# Patient Record
Sex: Female | Born: 1965 | Race: White | Hispanic: No | Smoking: Light tobacco smoker
Health system: Southern US, Community
[De-identification: ages and names within clinical notes are randomized; demographics above are authoritative.]

## PROBLEM LIST (undated history)

## (undated) ENCOUNTER — Emergency Department (HOSPITAL_COMMUNITY): Admission: EM | Discharge status: Absent without leave | Payer: Medicare Other

## (undated) ENCOUNTER — Emergency Department (HOSPITAL_COMMUNITY): Admission: EM | Payer: Medicare Other | Source: Home / Self Care

## (undated) DIAGNOSIS — K7290 Hepatic failure, unspecified without coma: Secondary | ICD-10-CM

## (undated) DIAGNOSIS — E039 Hypothyroidism, unspecified: Secondary | ICD-10-CM

## (undated) DIAGNOSIS — F319 Bipolar disorder, unspecified: Secondary | ICD-10-CM

## (undated) DIAGNOSIS — Z5189 Encounter for other specified aftercare: Secondary | ICD-10-CM

## (undated) DIAGNOSIS — N920 Excessive and frequent menstruation with regular cycle: Secondary | ICD-10-CM

## (undated) DIAGNOSIS — F419 Anxiety disorder, unspecified: Secondary | ICD-10-CM

## (undated) DIAGNOSIS — F329 Major depressive disorder, single episode, unspecified: Secondary | ICD-10-CM

## (undated) DIAGNOSIS — M3130 Wegener's granulomatosis without renal involvement: Secondary | ICD-10-CM

## (undated) DIAGNOSIS — Z862 Personal history of diseases of the blood and blood-forming organs and certain disorders involving the immune mechanism: Secondary | ICD-10-CM

## (undated) DIAGNOSIS — F32A Depression, unspecified: Secondary | ICD-10-CM

## (undated) DIAGNOSIS — J40 Bronchitis, not specified as acute or chronic: Secondary | ICD-10-CM

## (undated) DIAGNOSIS — K7460 Unspecified cirrhosis of liver: Secondary | ICD-10-CM

## (undated) HISTORY — PX: CHOLECYSTECTOMY: SHX55

## (undated) HISTORY — PX: TUBAL LIGATION: SHX77

## (undated) HISTORY — PX: ORTHOPEDIC SURGERY: SHX850

## (undated) HISTORY — PX: LAPAROSCOPY: SHX197

## (undated) HISTORY — PX: HERNIA REPAIR: SHX51

## (undated) HISTORY — PX: FRACTURE SURGERY: SHX138

---

## 2000-09-12 ENCOUNTER — Encounter: Payer: Self-pay | Admitting: Emergency Medicine

## 2000-09-12 ENCOUNTER — Emergency Department (HOSPITAL_COMMUNITY): Admission: EM | Admit: 2000-09-12 | Discharge: 2000-09-12 | Payer: Self-pay | Admitting: Emergency Medicine

## 2001-03-09 ENCOUNTER — Emergency Department (HOSPITAL_COMMUNITY): Admission: EM | Admit: 2001-03-09 | Discharge: 2001-03-09 | Payer: Self-pay | Admitting: Emergency Medicine

## 2001-05-28 ENCOUNTER — Inpatient Hospital Stay (HOSPITAL_COMMUNITY): Admission: AC | Admit: 2001-05-28 | Discharge: 2001-05-29 | Payer: Self-pay

## 2001-05-28 ENCOUNTER — Encounter: Payer: Self-pay | Admitting: General Surgery

## 2001-05-29 ENCOUNTER — Encounter: Payer: Self-pay | Admitting: General Surgery

## 2001-07-08 ENCOUNTER — Emergency Department (HOSPITAL_COMMUNITY): Admission: EM | Admit: 2001-07-08 | Discharge: 2001-07-08 | Payer: Self-pay | Admitting: Emergency Medicine

## 2001-12-11 ENCOUNTER — Inpatient Hospital Stay (HOSPITAL_COMMUNITY): Admission: EM | Admit: 2001-12-11 | Discharge: 2001-12-27 | Payer: Self-pay | Admitting: Psychiatry

## 2001-12-22 ENCOUNTER — Encounter: Payer: Self-pay | Admitting: Psychiatry

## 2002-01-06 ENCOUNTER — Emergency Department (HOSPITAL_COMMUNITY): Admission: EM | Admit: 2002-01-06 | Discharge: 2002-01-06 | Payer: Self-pay | Admitting: Emergency Medicine

## 2002-01-06 ENCOUNTER — Encounter: Payer: Self-pay | Admitting: Emergency Medicine

## 2002-05-28 ENCOUNTER — Inpatient Hospital Stay (HOSPITAL_COMMUNITY): Admission: AC | Admit: 2002-05-28 | Discharge: 2002-06-01 | Payer: Self-pay

## 2002-06-01 ENCOUNTER — Inpatient Hospital Stay (HOSPITAL_COMMUNITY): Admission: EM | Admit: 2002-06-01 | Discharge: 2002-06-10 | Payer: Self-pay | Admitting: *Deleted

## 2002-06-20 ENCOUNTER — Emergency Department (HOSPITAL_COMMUNITY): Admission: EM | Admit: 2002-06-20 | Discharge: 2002-06-21 | Payer: Self-pay | Admitting: Emergency Medicine

## 2002-06-21 ENCOUNTER — Inpatient Hospital Stay (HOSPITAL_COMMUNITY): Admission: EM | Admit: 2002-06-21 | Discharge: 2002-06-25 | Payer: Self-pay | Admitting: Psychiatry

## 2002-08-06 ENCOUNTER — Ambulatory Visit (HOSPITAL_BASED_OUTPATIENT_CLINIC_OR_DEPARTMENT_OTHER): Admission: RE | Admit: 2002-08-06 | Discharge: 2002-08-06 | Payer: Self-pay | Admitting: Orthopedic Surgery

## 2004-01-23 ENCOUNTER — Emergency Department (HOSPITAL_COMMUNITY): Admission: AD | Admit: 2004-01-23 | Discharge: 2004-01-23 | Payer: Self-pay | Admitting: Family Medicine

## 2004-03-03 ENCOUNTER — Emergency Department (HOSPITAL_COMMUNITY): Admission: EM | Admit: 2004-03-03 | Discharge: 2004-03-03 | Payer: Self-pay | Admitting: Family Medicine

## 2004-04-13 ENCOUNTER — Encounter: Admission: RE | Admit: 2004-04-13 | Discharge: 2004-04-13 | Payer: Self-pay | Admitting: Family Medicine

## 2004-04-16 ENCOUNTER — Encounter: Admission: RE | Admit: 2004-04-16 | Discharge: 2004-04-16 | Payer: Self-pay | Admitting: Family Medicine

## 2004-04-20 ENCOUNTER — Encounter: Admission: RE | Admit: 2004-04-20 | Discharge: 2004-04-20 | Payer: Self-pay | Admitting: Sports Medicine

## 2004-04-30 ENCOUNTER — Ambulatory Visit (HOSPITAL_COMMUNITY): Admission: RE | Admit: 2004-04-30 | Discharge: 2004-04-30 | Payer: Self-pay | Admitting: *Deleted

## 2004-05-23 ENCOUNTER — Emergency Department (HOSPITAL_COMMUNITY): Admission: EM | Admit: 2004-05-23 | Discharge: 2004-05-23 | Payer: Self-pay | Admitting: Emergency Medicine

## 2004-06-29 ENCOUNTER — Emergency Department (HOSPITAL_COMMUNITY): Admission: EM | Admit: 2004-06-29 | Discharge: 2004-06-29 | Payer: Self-pay | Admitting: Family Medicine

## 2004-08-26 ENCOUNTER — Inpatient Hospital Stay (HOSPITAL_COMMUNITY): Admission: EM | Admit: 2004-08-26 | Discharge: 2004-08-31 | Payer: Self-pay | Admitting: Emergency Medicine

## 2004-09-09 ENCOUNTER — Ambulatory Visit: Payer: Self-pay | Admitting: Family Medicine

## 2004-11-28 ENCOUNTER — Ambulatory Visit: Payer: Self-pay | Admitting: Psychiatry

## 2004-11-28 ENCOUNTER — Inpatient Hospital Stay (HOSPITAL_COMMUNITY): Admission: EM | Admit: 2004-11-28 | Discharge: 2004-12-10 | Payer: Self-pay | Admitting: Psychiatry

## 2004-11-29 ENCOUNTER — Encounter: Payer: Self-pay | Admitting: Emergency Medicine

## 2004-12-03 ENCOUNTER — Encounter: Payer: Self-pay | Admitting: Psychiatry

## 2004-12-08 ENCOUNTER — Ambulatory Visit: Payer: Self-pay | Admitting: Internal Medicine

## 2004-12-09 ENCOUNTER — Encounter: Payer: Self-pay | Admitting: Psychiatry

## 2004-12-16 ENCOUNTER — Ambulatory Visit: Payer: Self-pay | Admitting: Internal Medicine

## 2005-01-03 ENCOUNTER — Emergency Department (HOSPITAL_COMMUNITY): Admission: EM | Admit: 2005-01-03 | Discharge: 2005-01-03 | Payer: Self-pay | Admitting: Emergency Medicine

## 2005-01-07 ENCOUNTER — Ambulatory Visit: Payer: Self-pay | Admitting: Internal Medicine

## 2005-01-11 ENCOUNTER — Ambulatory Visit: Payer: Self-pay | Admitting: Internal Medicine

## 2005-01-11 DIAGNOSIS — K766 Portal hypertension: Secondary | ICD-10-CM

## 2005-01-11 DIAGNOSIS — K210 Gastro-esophageal reflux disease with esophagitis: Secondary | ICD-10-CM

## 2005-01-11 DIAGNOSIS — K644 Residual hemorrhoidal skin tags: Secondary | ICD-10-CM | POA: Insufficient documentation

## 2005-02-12 ENCOUNTER — Inpatient Hospital Stay (HOSPITAL_COMMUNITY): Admission: RE | Admit: 2005-02-12 | Discharge: 2005-02-21 | Payer: Self-pay | Admitting: Orthopedic Surgery

## 2005-02-12 ENCOUNTER — Encounter (INDEPENDENT_AMBULATORY_CARE_PROVIDER_SITE_OTHER): Payer: Self-pay | Admitting: Specialist

## 2005-03-01 ENCOUNTER — Ambulatory Visit: Payer: Self-pay | Admitting: Family Medicine

## 2005-03-09 ENCOUNTER — Ambulatory Visit: Payer: Self-pay | Admitting: Internal Medicine

## 2005-05-10 ENCOUNTER — Ambulatory Visit: Payer: Self-pay | Admitting: Internal Medicine

## 2005-12-30 ENCOUNTER — Encounter: Payer: Self-pay | Admitting: Emergency Medicine

## 2005-12-30 ENCOUNTER — Inpatient Hospital Stay (HOSPITAL_COMMUNITY): Admission: AD | Admit: 2005-12-30 | Discharge: 2006-01-06 | Payer: Self-pay | Admitting: Psychiatry

## 2005-12-31 ENCOUNTER — Ambulatory Visit: Payer: Self-pay | Admitting: Psychiatry

## 2006-01-11 ENCOUNTER — Ambulatory Visit: Payer: Self-pay | Admitting: Internal Medicine

## 2006-01-17 ENCOUNTER — Ambulatory Visit: Payer: Self-pay | Admitting: Internal Medicine

## 2006-01-31 ENCOUNTER — Ambulatory Visit: Payer: Self-pay | Admitting: Internal Medicine

## 2006-02-14 ENCOUNTER — Ambulatory Visit (HOSPITAL_BASED_OUTPATIENT_CLINIC_OR_DEPARTMENT_OTHER): Admission: RE | Admit: 2006-02-14 | Discharge: 2006-02-14 | Payer: Self-pay | Admitting: Orthopedic Surgery

## 2006-07-12 ENCOUNTER — Emergency Department (HOSPITAL_COMMUNITY): Admission: EM | Admit: 2006-07-12 | Discharge: 2006-07-13 | Payer: Self-pay | Admitting: Emergency Medicine

## 2007-02-05 ENCOUNTER — Ambulatory Visit: Payer: Self-pay | Admitting: Internal Medicine

## 2007-02-22 DIAGNOSIS — I8500 Esophageal varices without bleeding: Secondary | ICD-10-CM | POA: Insufficient documentation

## 2007-02-22 DIAGNOSIS — F1010 Alcohol abuse, uncomplicated: Secondary | ICD-10-CM | POA: Insufficient documentation

## 2007-02-22 DIAGNOSIS — F17200 Nicotine dependence, unspecified, uncomplicated: Secondary | ICD-10-CM

## 2007-04-23 ENCOUNTER — Emergency Department (HOSPITAL_COMMUNITY): Admission: EM | Admit: 2007-04-23 | Discharge: 2007-04-23 | Payer: Self-pay | Admitting: Emergency Medicine

## 2007-07-05 DIAGNOSIS — D500 Iron deficiency anemia secondary to blood loss (chronic): Secondary | ICD-10-CM | POA: Insufficient documentation

## 2007-07-05 DIAGNOSIS — D61818 Other pancytopenia: Secondary | ICD-10-CM

## 2007-07-05 DIAGNOSIS — I129 Hypertensive chronic kidney disease with stage 1 through stage 4 chronic kidney disease, or unspecified chronic kidney disease: Secondary | ICD-10-CM | POA: Insufficient documentation

## 2007-07-05 DIAGNOSIS — K253 Acute gastric ulcer without hemorrhage or perforation: Secondary | ICD-10-CM | POA: Insufficient documentation

## 2007-07-06 ENCOUNTER — Inpatient Hospital Stay (HOSPITAL_COMMUNITY): Admission: EM | Admit: 2007-07-06 | Discharge: 2007-07-10 | Payer: Self-pay | Admitting: Emergency Medicine

## 2007-07-09 ENCOUNTER — Ambulatory Visit: Payer: Self-pay | Admitting: Oncology

## 2007-07-10 ENCOUNTER — Encounter (INDEPENDENT_AMBULATORY_CARE_PROVIDER_SITE_OTHER): Payer: Self-pay | Admitting: Nurse Practitioner

## 2007-07-10 LAB — CONVERTED CEMR LAB: HIV-1 Genotyping, DNA Sequencing: NEGATIVE

## 2007-07-13 ENCOUNTER — Ambulatory Visit: Payer: Self-pay | Admitting: Internal Medicine

## 2007-07-18 ENCOUNTER — Inpatient Hospital Stay (HOSPITAL_COMMUNITY): Admission: EM | Admit: 2007-07-18 | Discharge: 2007-07-18 | Payer: Self-pay | Admitting: Emergency Medicine

## 2007-07-18 DIAGNOSIS — Z8719 Personal history of other diseases of the digestive system: Secondary | ICD-10-CM

## 2007-07-18 DIAGNOSIS — K649 Unspecified hemorrhoids: Secondary | ICD-10-CM | POA: Insufficient documentation

## 2007-07-23 ENCOUNTER — Emergency Department (HOSPITAL_COMMUNITY): Admission: EM | Admit: 2007-07-23 | Discharge: 2007-07-23 | Payer: Self-pay | Admitting: Emergency Medicine

## 2007-07-30 ENCOUNTER — Ambulatory Visit: Payer: Self-pay | Admitting: Internal Medicine

## 2007-07-31 ENCOUNTER — Encounter (INDEPENDENT_AMBULATORY_CARE_PROVIDER_SITE_OTHER): Payer: Self-pay | Admitting: Family Medicine

## 2007-07-31 LAB — CONVERTED CEMR LAB
ALT: 11 units/L (ref 0–35)
AST: 36 units/L (ref 0–37)
Alkaline Phosphatase: 94 units/L (ref 39–117)
Amylase: 16 units/L (ref 0–105)
Basophils Absolute: 0 10*3/uL (ref 0.0–0.1)
Chloride: 106 meq/L (ref 96–112)
Creatinine, Ser: 0.61 mg/dL (ref 0.40–1.20)
Eosinophils Absolute: 0.1 10*3/uL (ref 0.0–0.7)
Eosinophils Relative: 2 % (ref 0–5)
Lipase: 30 units/L (ref 0–75)
MCHC: 29.8 g/dL — ABNORMAL LOW (ref 30.0–36.0)
MCV: 93.8 fL (ref 78.0–100.0)
Monocytes Absolute: 0.5 10*3/uL (ref 0.2–0.7)
Monocytes Relative: 11 % (ref 3–11)
Neutrophils Relative %: 64 % (ref 43–77)
Potassium: 4.4 meq/L (ref 3.5–5.3)
RBC: 3.9 M/uL (ref 3.87–5.11)
WBC: 4.3 10*3/uL (ref 4.0–10.5)

## 2007-08-01 ENCOUNTER — Ambulatory Visit: Payer: Self-pay | Admitting: Internal Medicine

## 2007-08-13 ENCOUNTER — Ambulatory Visit: Payer: Self-pay | Admitting: Internal Medicine

## 2007-08-13 ENCOUNTER — Encounter: Payer: Self-pay | Admitting: Nurse Practitioner

## 2007-08-13 DIAGNOSIS — F319 Bipolar disorder, unspecified: Secondary | ICD-10-CM

## 2007-08-13 DIAGNOSIS — F1910 Other psychoactive substance abuse, uncomplicated: Secondary | ICD-10-CM

## 2007-08-13 DIAGNOSIS — K2289 Other specified disease of esophagus: Secondary | ICD-10-CM | POA: Insufficient documentation

## 2007-08-13 DIAGNOSIS — K228 Other specified diseases of esophagus: Secondary | ICD-10-CM

## 2007-08-13 DIAGNOSIS — K7030 Alcoholic cirrhosis of liver without ascites: Secondary | ICD-10-CM | POA: Insufficient documentation

## 2007-08-13 DIAGNOSIS — Z8659 Personal history of other mental and behavioral disorders: Secondary | ICD-10-CM

## 2007-08-23 ENCOUNTER — Telehealth (INDEPENDENT_AMBULATORY_CARE_PROVIDER_SITE_OTHER): Payer: Self-pay | Admitting: *Deleted

## 2007-08-30 ENCOUNTER — Ambulatory Visit: Payer: Self-pay | Admitting: Internal Medicine

## 2007-08-30 ENCOUNTER — Encounter: Payer: Self-pay | Admitting: Nurse Practitioner

## 2007-08-30 DIAGNOSIS — G4700 Insomnia, unspecified: Secondary | ICD-10-CM | POA: Insufficient documentation

## 2007-09-20 ENCOUNTER — Ambulatory Visit: Payer: Self-pay | Admitting: Nurse Practitioner

## 2007-09-20 LAB — CONVERTED CEMR LAB
Basophils Absolute: 0 10*3/uL (ref 0.0–0.1)
Bilirubin, Direct: 0.6 mg/dL — ABNORMAL HIGH (ref 0.0–0.3)
HCT: 31.9 % — ABNORMAL LOW (ref 36.0–46.0)
Hemoglobin: 9.7 g/dL — ABNORMAL LOW (ref 12.0–15.0)
Lipase: 33 units/L (ref 0–75)
Lymphocytes Relative: 36 % (ref 12–46)
Lymphs Abs: 0.9 10*3/uL (ref 0.7–3.3)
Monocytes Absolute: 0.3 10*3/uL (ref 0.2–0.7)
Monocytes Relative: 10 % (ref 3–11)
Neutro Abs: 1.3 10*3/uL — ABNORMAL LOW (ref 1.7–7.7)
Neutrophils Relative %: 51 % (ref 43–77)
Platelets: 193 10*3/uL (ref 150–400)
RDW: 16.2 % — ABNORMAL HIGH (ref 11.5–14.0)
Total Bilirubin: 1.7 mg/dL — ABNORMAL HIGH (ref 0.3–1.2)

## 2007-09-21 ENCOUNTER — Encounter (INDEPENDENT_AMBULATORY_CARE_PROVIDER_SITE_OTHER): Payer: Self-pay | Admitting: Nurse Practitioner

## 2007-09-24 ENCOUNTER — Telehealth (INDEPENDENT_AMBULATORY_CARE_PROVIDER_SITE_OTHER): Payer: Self-pay | Admitting: Nurse Practitioner

## 2007-10-11 ENCOUNTER — Ambulatory Visit: Payer: Self-pay | Admitting: Nurse Practitioner

## 2007-11-12 ENCOUNTER — Ambulatory Visit: Payer: Self-pay | Admitting: Nurse Practitioner

## 2007-11-30 ENCOUNTER — Ambulatory Visit: Payer: Self-pay | Admitting: Nurse Practitioner

## 2007-12-12 ENCOUNTER — Ambulatory Visit: Payer: Self-pay | Admitting: Nurse Practitioner

## 2007-12-12 LAB — CONVERTED CEMR LAB
ALT: 15 units/L (ref 0–35)
Alkaline Phosphatase: 83 units/L (ref 39–117)
BUN: 10 mg/dL (ref 6–23)
Basophils Absolute: 0 10*3/uL (ref 0.0–0.1)
CO2: 24 meq/L (ref 19–32)
Calcium: 8.7 mg/dL (ref 8.4–10.5)
Chloride: 102 meq/L (ref 96–112)
Creatinine, Ser: 0.75 mg/dL (ref 0.40–1.20)
Eosinophils Relative: 2 % (ref 0–5)
Glucose, Bld: 110 mg/dL — ABNORMAL HIGH (ref 70–99)
HCT: 33.1 % — ABNORMAL LOW (ref 36.0–46.0)
Lymphs Abs: 0.8 10*3/uL (ref 0.7–4.0)
Monocytes Absolute: 0.2 10*3/uL (ref 0.1–1.0)
Potassium: 4.4 meq/L (ref 3.5–5.3)
RBC: 4.24 M/uL (ref 3.87–5.11)
Total Bilirubin: 1.1 mg/dL (ref 0.3–1.2)
WBC: 2.3 10*3/uL — ABNORMAL LOW (ref 4.0–10.5)

## 2007-12-19 ENCOUNTER — Ambulatory Visit: Payer: Self-pay | Admitting: Nurse Practitioner

## 2008-01-09 ENCOUNTER — Ambulatory Visit: Payer: Self-pay | Admitting: Nurse Practitioner

## 2008-01-23 ENCOUNTER — Ambulatory Visit: Payer: Self-pay | Admitting: Nurse Practitioner

## 2008-01-23 DIAGNOSIS — F329 Major depressive disorder, single episode, unspecified: Secondary | ICD-10-CM

## 2008-01-23 DIAGNOSIS — F3289 Other specified depressive episodes: Secondary | ICD-10-CM | POA: Insufficient documentation

## 2008-02-07 ENCOUNTER — Ambulatory Visit: Payer: Self-pay | Admitting: Nurse Practitioner

## 2008-02-21 ENCOUNTER — Ambulatory Visit: Payer: Self-pay | Admitting: Nurse Practitioner

## 2008-03-06 ENCOUNTER — Ambulatory Visit: Payer: Self-pay | Admitting: Nurse Practitioner

## 2008-03-06 DIAGNOSIS — K709 Alcoholic liver disease, unspecified: Secondary | ICD-10-CM

## 2008-03-11 ENCOUNTER — Encounter (INDEPENDENT_AMBULATORY_CARE_PROVIDER_SITE_OTHER): Payer: Self-pay | Admitting: Nurse Practitioner

## 2008-03-12 ENCOUNTER — Telehealth (INDEPENDENT_AMBULATORY_CARE_PROVIDER_SITE_OTHER): Payer: Self-pay | Admitting: Nurse Practitioner

## 2008-03-12 DIAGNOSIS — K439 Ventral hernia without obstruction or gangrene: Secondary | ICD-10-CM | POA: Insufficient documentation

## 2008-03-24 ENCOUNTER — Ambulatory Visit: Payer: Self-pay | Admitting: Nurse Practitioner

## 2008-03-25 ENCOUNTER — Encounter (INDEPENDENT_AMBULATORY_CARE_PROVIDER_SITE_OTHER): Payer: Self-pay | Admitting: Nurse Practitioner

## 2008-04-02 ENCOUNTER — Encounter (INDEPENDENT_AMBULATORY_CARE_PROVIDER_SITE_OTHER): Payer: Self-pay | Admitting: Nurse Practitioner

## 2008-08-25 ENCOUNTER — Ambulatory Visit: Payer: Self-pay | Admitting: Nurse Practitioner

## 2008-08-25 DIAGNOSIS — M79609 Pain in unspecified limb: Secondary | ICD-10-CM | POA: Insufficient documentation

## 2008-08-25 DIAGNOSIS — H5789 Other specified disorders of eye and adnexa: Secondary | ICD-10-CM

## 2008-08-26 LAB — CONVERTED CEMR LAB
ALT: 17 units/L (ref 0–35)
AST: 25 units/L (ref 0–37)
Albumin: 4.3 g/dL (ref 3.5–5.2)
Basophils Absolute: 0 10*3/uL (ref 0.0–0.1)
Chloride: 100 meq/L (ref 96–112)
Eosinophils Absolute: 0 10*3/uL (ref 0.0–0.7)
Glucose, Bld: 101 mg/dL — ABNORMAL HIGH (ref 70–99)
Hemoglobin: 12.4 g/dL (ref 12.0–15.0)
INR: 1.1 (ref 0.0–1.5)
Lymphocytes Relative: 26 % (ref 12–46)
Lymphs Abs: 0.8 10*3/uL (ref 0.7–4.0)
MCV: 84.2 fL (ref 78.0–100.0)
Monocytes Absolute: 0.3 10*3/uL (ref 0.1–1.0)
Monocytes Relative: 10 % (ref 3–12)
Potassium: 4.1 meq/L (ref 3.5–5.3)
Total Bilirubin: 1.7 mg/dL — ABNORMAL HIGH (ref 0.3–1.2)
WBC: 3.1 10*3/uL — ABNORMAL LOW (ref 4.0–10.5)

## 2008-09-25 ENCOUNTER — Ambulatory Visit: Payer: Self-pay | Admitting: Nurse Practitioner

## 2008-10-15 ENCOUNTER — Emergency Department (HOSPITAL_COMMUNITY): Admission: EM | Admit: 2008-10-15 | Discharge: 2008-10-15 | Payer: Self-pay | Admitting: Emergency Medicine

## 2008-10-27 ENCOUNTER — Encounter (INDEPENDENT_AMBULATORY_CARE_PROVIDER_SITE_OTHER): Payer: Self-pay | Admitting: *Deleted

## 2008-11-06 ENCOUNTER — Encounter: Payer: Self-pay | Admitting: Emergency Medicine

## 2008-11-06 ENCOUNTER — Inpatient Hospital Stay (HOSPITAL_COMMUNITY): Admission: EM | Admit: 2008-11-06 | Discharge: 2008-12-05 | Payer: Self-pay | Admitting: Psychiatry

## 2008-11-07 ENCOUNTER — Ambulatory Visit: Payer: Self-pay | Admitting: Psychiatry

## 2008-11-18 ENCOUNTER — Encounter (INDEPENDENT_AMBULATORY_CARE_PROVIDER_SITE_OTHER): Payer: Self-pay | Admitting: Nurse Practitioner

## 2008-11-20 ENCOUNTER — Encounter: Payer: Self-pay | Admitting: Emergency Medicine

## 2008-11-27 ENCOUNTER — Other Ambulatory Visit: Payer: Self-pay | Admitting: *Deleted

## 2008-12-02 ENCOUNTER — Other Ambulatory Visit (HOSPITAL_COMMUNITY): Payer: Self-pay | Admitting: Psychiatry

## 2008-12-02 ENCOUNTER — Other Ambulatory Visit: Payer: Self-pay | Admitting: *Deleted

## 2008-12-03 ENCOUNTER — Ambulatory Visit: Payer: Self-pay | Admitting: Vascular Surgery

## 2008-12-03 ENCOUNTER — Encounter (HOSPITAL_COMMUNITY): Payer: Self-pay | Admitting: Psychiatry

## 2008-12-23 ENCOUNTER — Telehealth (INDEPENDENT_AMBULATORY_CARE_PROVIDER_SITE_OTHER): Payer: Self-pay | Admitting: Nurse Practitioner

## 2008-12-24 ENCOUNTER — Encounter (INDEPENDENT_AMBULATORY_CARE_PROVIDER_SITE_OTHER): Payer: Self-pay | Admitting: *Deleted

## 2008-12-29 ENCOUNTER — Ambulatory Visit: Payer: Self-pay | Admitting: Nurse Practitioner

## 2008-12-29 DIAGNOSIS — K449 Diaphragmatic hernia without obstruction or gangrene: Secondary | ICD-10-CM | POA: Insufficient documentation

## 2008-12-30 ENCOUNTER — Encounter (INDEPENDENT_AMBULATORY_CARE_PROVIDER_SITE_OTHER): Payer: Self-pay | Admitting: Nurse Practitioner

## 2009-01-01 ENCOUNTER — Telehealth (INDEPENDENT_AMBULATORY_CARE_PROVIDER_SITE_OTHER): Payer: Self-pay | Admitting: Nurse Practitioner

## 2009-01-12 ENCOUNTER — Encounter (INDEPENDENT_AMBULATORY_CARE_PROVIDER_SITE_OTHER): Payer: Self-pay | Admitting: Nurse Practitioner

## 2009-01-14 ENCOUNTER — Ambulatory Visit: Payer: Self-pay | Admitting: Nurse Practitioner

## 2009-01-15 ENCOUNTER — Telehealth (INDEPENDENT_AMBULATORY_CARE_PROVIDER_SITE_OTHER): Payer: Self-pay | Admitting: Nurse Practitioner

## 2009-01-19 ENCOUNTER — Telehealth (INDEPENDENT_AMBULATORY_CARE_PROVIDER_SITE_OTHER): Payer: Self-pay | Admitting: *Deleted

## 2009-01-28 ENCOUNTER — Encounter (INDEPENDENT_AMBULATORY_CARE_PROVIDER_SITE_OTHER): Payer: Self-pay | Admitting: *Deleted

## 2009-02-10 ENCOUNTER — Telehealth (INDEPENDENT_AMBULATORY_CARE_PROVIDER_SITE_OTHER): Payer: Self-pay | Admitting: Nurse Practitioner

## 2009-02-18 ENCOUNTER — Ambulatory Visit: Payer: Self-pay | Admitting: Nurse Practitioner

## 2009-02-18 DIAGNOSIS — R498 Other voice and resonance disorders: Secondary | ICD-10-CM | POA: Insufficient documentation

## 2009-02-27 ENCOUNTER — Other Ambulatory Visit: Admission: RE | Admit: 2009-02-27 | Discharge: 2009-02-27 | Payer: Self-pay | Admitting: Internal Medicine

## 2009-02-27 ENCOUNTER — Encounter (INDEPENDENT_AMBULATORY_CARE_PROVIDER_SITE_OTHER): Payer: Self-pay | Admitting: Nurse Practitioner

## 2009-02-27 ENCOUNTER — Ambulatory Visit: Payer: Self-pay | Admitting: Nurse Practitioner

## 2009-03-02 ENCOUNTER — Encounter (INDEPENDENT_AMBULATORY_CARE_PROVIDER_SITE_OTHER): Payer: Self-pay | Admitting: Nurse Practitioner

## 2009-03-02 DIAGNOSIS — E039 Hypothyroidism, unspecified: Secondary | ICD-10-CM

## 2009-03-02 LAB — CONVERTED CEMR LAB: T3, Free: 3.3 pg/mL (ref 2.3–4.2)

## 2009-03-03 ENCOUNTER — Encounter (INDEPENDENT_AMBULATORY_CARE_PROVIDER_SITE_OTHER): Payer: Self-pay | Admitting: Nurse Practitioner

## 2009-03-03 ENCOUNTER — Encounter: Admission: RE | Admit: 2009-03-03 | Discharge: 2009-03-11 | Payer: Self-pay | Admitting: Sports Medicine

## 2009-03-09 ENCOUNTER — Encounter (INDEPENDENT_AMBULATORY_CARE_PROVIDER_SITE_OTHER): Payer: Self-pay | Admitting: Nurse Practitioner

## 2009-03-12 ENCOUNTER — Encounter (INDEPENDENT_AMBULATORY_CARE_PROVIDER_SITE_OTHER): Payer: Self-pay | Admitting: Nurse Practitioner

## 2009-03-13 ENCOUNTER — Inpatient Hospital Stay (HOSPITAL_COMMUNITY): Admission: RE | Admit: 2009-03-13 | Discharge: 2009-03-17 | Payer: Self-pay | Admitting: Surgery

## 2009-03-18 ENCOUNTER — Emergency Department (HOSPITAL_COMMUNITY): Admission: EM | Admit: 2009-03-18 | Discharge: 2009-03-18 | Payer: Self-pay | Admitting: Emergency Medicine

## 2009-03-20 ENCOUNTER — Telehealth (INDEPENDENT_AMBULATORY_CARE_PROVIDER_SITE_OTHER): Payer: Self-pay | Admitting: Nurse Practitioner

## 2009-04-03 ENCOUNTER — Encounter (INDEPENDENT_AMBULATORY_CARE_PROVIDER_SITE_OTHER): Payer: Self-pay | Admitting: Nurse Practitioner

## 2009-05-29 ENCOUNTER — Encounter (INDEPENDENT_AMBULATORY_CARE_PROVIDER_SITE_OTHER): Payer: Self-pay | Admitting: Nurse Practitioner

## 2009-06-24 ENCOUNTER — Ambulatory Visit: Payer: Self-pay | Admitting: Nurse Practitioner

## 2009-06-26 ENCOUNTER — Encounter (INDEPENDENT_AMBULATORY_CARE_PROVIDER_SITE_OTHER): Payer: Self-pay | Admitting: Nurse Practitioner

## 2009-07-03 ENCOUNTER — Ambulatory Visit: Payer: Self-pay | Admitting: Nurse Practitioner

## 2009-07-16 ENCOUNTER — Ambulatory Visit (HOSPITAL_BASED_OUTPATIENT_CLINIC_OR_DEPARTMENT_OTHER): Admission: RE | Admit: 2009-07-16 | Discharge: 2009-07-16 | Payer: Self-pay | Admitting: Orthopedic Surgery

## 2009-07-20 ENCOUNTER — Telehealth (INDEPENDENT_AMBULATORY_CARE_PROVIDER_SITE_OTHER): Payer: Self-pay | Admitting: Nurse Practitioner

## 2009-08-05 ENCOUNTER — Ambulatory Visit: Payer: Self-pay | Admitting: Nurse Practitioner

## 2009-08-12 LAB — CONVERTED CEMR LAB
ALT: 11 units/L (ref 0–35)
Alkaline Phosphatase: 83 units/L (ref 39–117)
BUN: 11 mg/dL (ref 6–23)
Basophils Absolute: 0 10*3/uL (ref 0.0–0.1)
Basophils Relative: 0 % (ref 0–1)
Calcium: 8.8 mg/dL (ref 8.4–10.5)
Hemoglobin: 11.7 g/dL — ABNORMAL LOW (ref 12.0–15.0)
INR: 1.1 (ref 0.0–1.5)
Monocytes Absolute: 0.2 10*3/uL (ref 0.1–1.0)
Monocytes Relative: 9 % (ref 3–12)
Neutrophils Relative %: 48 % (ref 43–77)
Platelets: 100 10*3/uL — ABNORMAL LOW (ref 150–400)
Prothrombin Time: 14 s (ref 11.6–15.2)
RBC: 4.19 M/uL (ref 3.87–5.11)
TSH: 2.803 microintl units/mL (ref 0.350–4.500)
Total Protein: 7.1 g/dL (ref 6.0–8.3)

## 2009-08-18 ENCOUNTER — Encounter: Admission: RE | Admit: 2009-08-18 | Discharge: 2009-08-18 | Payer: Self-pay | Admitting: Internal Medicine

## 2009-08-20 ENCOUNTER — Ambulatory Visit: Payer: Self-pay | Admitting: Nurse Practitioner

## 2009-09-16 ENCOUNTER — Telehealth (INDEPENDENT_AMBULATORY_CARE_PROVIDER_SITE_OTHER): Payer: Self-pay | Admitting: Nurse Practitioner

## 2009-10-02 ENCOUNTER — Ambulatory Visit: Payer: Self-pay | Admitting: Nurse Practitioner

## 2009-10-07 ENCOUNTER — Telehealth (INDEPENDENT_AMBULATORY_CARE_PROVIDER_SITE_OTHER): Payer: Self-pay | Admitting: Nurse Practitioner

## 2009-11-06 ENCOUNTER — Ambulatory Visit: Payer: Self-pay | Admitting: Nurse Practitioner

## 2009-11-10 LAB — CONVERTED CEMR LAB
Free T4: 0.86 ng/dL (ref 0.80–1.80)
T3, Free: 2.3 pg/mL (ref 2.3–4.2)

## 2009-11-12 ENCOUNTER — Ambulatory Visit (HOSPITAL_COMMUNITY): Admission: RE | Admit: 2009-11-12 | Discharge: 2009-11-12 | Payer: Self-pay | Admitting: Internal Medicine

## 2009-11-17 ENCOUNTER — Telehealth (INDEPENDENT_AMBULATORY_CARE_PROVIDER_SITE_OTHER): Payer: Self-pay | Admitting: Nurse Practitioner

## 2009-11-27 ENCOUNTER — Encounter (INDEPENDENT_AMBULATORY_CARE_PROVIDER_SITE_OTHER): Payer: Self-pay | Admitting: Nurse Practitioner

## 2009-11-30 ENCOUNTER — Encounter (INDEPENDENT_AMBULATORY_CARE_PROVIDER_SITE_OTHER): Payer: Self-pay | Admitting: Nurse Practitioner

## 2009-12-04 ENCOUNTER — Emergency Department (HOSPITAL_COMMUNITY): Admission: EM | Admit: 2009-12-04 | Discharge: 2009-12-04 | Payer: Self-pay | Admitting: Family Medicine

## 2009-12-17 ENCOUNTER — Ambulatory Visit: Payer: Self-pay | Admitting: Nurse Practitioner

## 2009-12-17 LAB — CONVERTED CEMR LAB: Blood Glucose, Fingerstick: 99

## 2010-01-11 ENCOUNTER — Ambulatory Visit: Payer: Self-pay | Admitting: Nurse Practitioner

## 2010-01-11 DIAGNOSIS — G8921 Chronic pain due to trauma: Secondary | ICD-10-CM

## 2010-01-11 DIAGNOSIS — I10 Essential (primary) hypertension: Secondary | ICD-10-CM

## 2010-02-05 ENCOUNTER — Ambulatory Visit: Payer: Self-pay | Admitting: Nurse Practitioner

## 2010-02-05 DIAGNOSIS — M25569 Pain in unspecified knee: Secondary | ICD-10-CM

## 2010-02-08 DIAGNOSIS — R7989 Other specified abnormal findings of blood chemistry: Secondary | ICD-10-CM | POA: Insufficient documentation

## 2010-02-08 LAB — CONVERTED CEMR LAB: Uric Acid, Serum: 7.2 mg/dL — ABNORMAL HIGH (ref 2.4–7.0)

## 2010-03-05 ENCOUNTER — Ambulatory Visit: Payer: Self-pay | Admitting: Nurse Practitioner

## 2010-03-19 ENCOUNTER — Other Ambulatory Visit: Payer: Self-pay | Admitting: Emergency Medicine

## 2010-03-20 ENCOUNTER — Ambulatory Visit: Payer: Self-pay | Admitting: Psychiatry

## 2010-03-20 ENCOUNTER — Inpatient Hospital Stay (HOSPITAL_COMMUNITY): Admission: EM | Admit: 2010-03-20 | Discharge: 2010-03-30 | Payer: Self-pay | Admitting: Psychiatry

## 2010-03-25 ENCOUNTER — Encounter (HOSPITAL_COMMUNITY): Payer: Self-pay | Admitting: Psychiatry

## 2010-03-29 ENCOUNTER — Ambulatory Visit: Payer: Self-pay | Admitting: Vascular Surgery

## 2010-03-29 ENCOUNTER — Encounter (HOSPITAL_COMMUNITY): Payer: Self-pay | Admitting: Psychiatry

## 2010-03-30 ENCOUNTER — Inpatient Hospital Stay (HOSPITAL_COMMUNITY): Admission: EM | Admit: 2010-03-30 | Discharge: 2010-04-16 | Payer: Self-pay | Admitting: Internal Medicine

## 2010-03-30 DIAGNOSIS — D869 Sarcoidosis, unspecified: Secondary | ICD-10-CM

## 2010-03-31 ENCOUNTER — Encounter (INDEPENDENT_AMBULATORY_CARE_PROVIDER_SITE_OTHER): Payer: Self-pay | Admitting: Internal Medicine

## 2010-04-01 ENCOUNTER — Encounter (INDEPENDENT_AMBULATORY_CARE_PROVIDER_SITE_OTHER): Payer: Self-pay | Admitting: Internal Medicine

## 2010-04-04 ENCOUNTER — Encounter (INDEPENDENT_AMBULATORY_CARE_PROVIDER_SITE_OTHER): Payer: Self-pay | Admitting: Internal Medicine

## 2010-04-05 ENCOUNTER — Ambulatory Visit: Payer: Self-pay | Admitting: Infectious Disease

## 2010-04-07 ENCOUNTER — Encounter (INDEPENDENT_AMBULATORY_CARE_PROVIDER_SITE_OTHER): Payer: Self-pay | Admitting: Internal Medicine

## 2010-04-28 ENCOUNTER — Encounter: Payer: Self-pay | Admitting: Infectious Disease

## 2010-04-29 ENCOUNTER — Telehealth (INDEPENDENT_AMBULATORY_CARE_PROVIDER_SITE_OTHER): Payer: Self-pay | Admitting: Nurse Practitioner

## 2010-05-11 ENCOUNTER — Ambulatory Visit: Payer: Self-pay | Admitting: Nurse Practitioner

## 2010-05-12 ENCOUNTER — Encounter (INDEPENDENT_AMBULATORY_CARE_PROVIDER_SITE_OTHER): Payer: Self-pay | Admitting: Nurse Practitioner

## 2010-05-12 LAB — CONVERTED CEMR LAB
Alcohol, Ethyl (B): <10 mg/dL (ref 0–10)
Amphetamine Screen, Ur: NEGATIVE
Amylase: 23 units/L (ref 0–105)
Barbiturate Quant, Ur: NEGATIVE
Benzodiazepines.: POSITIVE — AB
Eosinophils Relative: 2 % (ref 0–5)
HCT: 35.2 % — ABNORMAL LOW (ref 36.0–46.0)
Lymphocytes Relative: 34 % (ref 12–46)
Lymphs Abs: 0.7 10*3/uL (ref 0.7–4.0)
MCV: 92.9 fL (ref 78.0–100.0)
Opiate Screen, Urine: NEGATIVE
Platelets: 117 10*3/uL — ABNORMAL LOW (ref 150–400)
RDW: 18 % — ABNORMAL HIGH (ref 11.5–15.5)
WBC: 2.1 10*3/uL — ABNORMAL LOW (ref 4.0–10.5)

## 2010-05-13 ENCOUNTER — Telehealth (INDEPENDENT_AMBULATORY_CARE_PROVIDER_SITE_OTHER): Payer: Self-pay | Admitting: Nurse Practitioner

## 2010-06-17 ENCOUNTER — Other Ambulatory Visit: Payer: Self-pay

## 2010-06-17 ENCOUNTER — Other Ambulatory Visit: Payer: Self-pay | Admitting: Emergency Medicine

## 2010-06-18 ENCOUNTER — Ambulatory Visit: Payer: Self-pay | Admitting: Psychiatry

## 2010-06-18 ENCOUNTER — Inpatient Hospital Stay (HOSPITAL_COMMUNITY): Admission: EM | Admit: 2010-06-18 | Discharge: 2010-07-02 | Payer: Self-pay | Admitting: Psychiatry

## 2010-06-30 ENCOUNTER — Encounter (HOSPITAL_COMMUNITY): Payer: Self-pay | Admitting: Psychiatry

## 2010-09-16 ENCOUNTER — Other Ambulatory Visit: Payer: Self-pay | Admitting: Emergency Medicine

## 2010-09-16 ENCOUNTER — Ambulatory Visit: Payer: Self-pay | Admitting: Psychiatry

## 2010-10-08 ENCOUNTER — Emergency Department (HOSPITAL_COMMUNITY): Admission: EM | Admit: 2010-10-08 | Discharge: 2010-10-08 | Payer: Self-pay | Admitting: Emergency Medicine

## 2010-10-09 ENCOUNTER — Inpatient Hospital Stay (HOSPITAL_COMMUNITY): Admission: AD | Admit: 2010-10-09 | Discharge: 2010-10-09 | Payer: Self-pay | Admitting: Obstetrics & Gynecology

## 2010-10-09 ENCOUNTER — Ambulatory Visit: Payer: Self-pay | Admitting: Nurse Practitioner

## 2010-12-02 ENCOUNTER — Inpatient Hospital Stay (HOSPITAL_COMMUNITY): Admission: EM | Admit: 2010-12-02 | Discharge: 2010-09-21 | Payer: Self-pay | Admitting: Psychiatry

## 2011-01-16 ENCOUNTER — Encounter: Payer: Self-pay | Admitting: Family Medicine

## 2011-01-16 ENCOUNTER — Encounter: Payer: Self-pay | Admitting: Internal Medicine

## 2011-01-23 LAB — CONVERTED CEMR LAB
ALT: 25 units/L (ref 0–35)
Albumin: 4.3 g/dL (ref 3.5–5.2)
Alkaline Phosphatase: 74 units/L (ref 39–117)
Basophils Absolute: 0 10*3/uL (ref 0.0–0.1)
Basophils Relative: 1 % (ref 0–1)
CO2: 24 meq/L (ref 19–32)
Calcium: 9.2 mg/dL (ref 8.4–10.5)
Chlamydia, DNA Probe: NEGATIVE
Chloride: 101 meq/L (ref 96–112)
Eosinophils Absolute: 0.2 10*3/uL (ref 0.0–0.7)
Eosinophils Relative: 4 % (ref 0–5)
Glucose, Urine, Semiquant: NEGATIVE
HCT: 37.9 % (ref 36.0–46.0)
HDL: 40 mg/dL (ref >39)
KOH Prep: NEGATIVE
Lymphocytes Relative: 27 % (ref 12–46)
MCHC: 32.2 g/dL (ref 30.0–36.0)
Monocytes Absolute: 0.3 10*3/uL (ref 0.1–1.0)
Monocytes Relative: 7 % (ref 3–12)
Neutrophils Relative %: 62 % (ref 43–77)
Nitrite: NEGATIVE
Specific Gravity, Urine: 1.015
TSH: 7.295 microintl units/mL — ABNORMAL HIGH (ref 0.350–4.500)
Total CHOL/HDL Ratio: 3.9
Total Protein: 7.1 g/dL (ref 6.0–8.3)
Triglycerides: 90 mg/dL (ref <150)
Urobilinogen, UA: 1
WBC Urine, dipstick: NEGATIVE

## 2011-01-25 NOTE — Letter (Signed)
Summary: REFERRAL//[facility]  REFERRAL//[facility]   Imported By: Arta Bruce 02/10/2010 14:34:48  _____________________________________________________________________  External Attachment:    Type:   Image     Comment:   External Document

## 2011-01-25 NOTE — Assessment & Plan Note (Signed)
Summary: Chronic medications   Vital Signs:  Patient profile:   45 year old female LMP:     12/2009 Weight:      255.4 pounds BMI:     40.15 BSA:     2.25 Temp:     97.6 degrees F oral Pulse rate:   88 / minute Pulse rhythm:   regular Resp:     20 per minute BP sitting:   140 / 92  (left arm) Cuff size:   large  Vitals Entered By: Levon Hedger (January 11, 2010 2:30 PM) CC: renew medications Is Patient Diabetic? No Pain Assessment Patient in pain? yes     Location: leg, back  Does patient need assistance? Functional Status Self care Ambulation Normal LMP (date): 12/2009 LMP - Character: light     Enter LMP: 12/2009 Last PAP Result  Specimen Adequacy: Satisfactory for evaluation.   Interpretation/Result:Negative for intraepithelial Lesion or Malignancy.      Primary Care Provider:  Lehman Prom FNP  CC:  renew medications.  History of Present Illness:  Pt into the office for monthly follow-up. Pt is taking her meds as ordered.  Hoarseness - pt has ongoing f/u with ENT for hoarseness and is on meds from that office.  Ortho - recent surgery to her right foot; ongoing f/u with ortho. Pain meds are controlling the discomfort.  Social - pt is taking about her mother and her recent hospital d/c for pneumonia No drugs or ETOH use.  Habits & Providers  Alcohol-Tobacco-Diet     Alcohol drinks/day: 0     Alcohol Counseling: not indicated; use of alcohol is not excessive or problematic     Alcohol type: beer and wine     Tobacco Status: current     Cigarette Packs/Day: 1/2     Year Started: 1998  Exercise-Depression-Behavior     Does Patient Exercise: no     Depression Counseling: further diagnostic testing and/or other treatment is indicated     Drug Use: no     Seat Belt Use: 100     Sun Exposure: occasionally  Comments: Pt is still going to the [facility]  Allergies (verified): 1)  ! * Mophine 2)  ! Prozac 3)  ! Reglan  Review of  Systems ENT:  Complains of hoarseness. CV:  Denies chest pain or discomfort. Resp:  Denies cough. Psych:  Complains of anxiety and depression.  Physical Exam  General:  alert.   Head:  normocephalic.   Mouth:  fair dentition.   Lungs:  normal breath sounds.   Heart:  normal rate and regular rhythm.   Abdomen:  normal bowel sound hepatomegaly.   Neurologic:  alert & oriented X3.   Skin:  multiple tattoos Psych:  Oriented X3.     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 1 tablet by mouth nightly as needed for sleep    Rozerem 8 Mg Tabs (Ramelteon) .Marland Kitchen... 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 1 tablet by mouth two times a day as needed for nausea 2)  Ativan 1 Mg Tabs (Lorazepam) .Marland Kitchen.. 1 tablet by mouth two times a day as needed for nerves 3)  Protonix 40 Mg Tbec (Pantoprazole sodium) .Marland Kitchen.. 1 tablet by mouth once daily 4)  Multivitamins Tabs (Multiple vitamin) .Marland Kitchen.. 1 tablet by mouth once daily 5)  Folic Acid 1 Mg Tabs (Folic acid) .Marland Kitchen.. 1 tablet by mouth once daily 6)  Thiamine Hcl 100 Mg Tabs (Thiamine hcl) .Marland Kitchen.. 1 tablet by mouth once daily 7)  Lactulose 10 Gm/51ml Soln (Lactulose) .... Take 30ml two times per day 8)  Anusol-hc 25 Mg Supp (Hydrocortisone acetate) .Marland Kitchen.. 1 pr once daily as  needed 9)  Zolpidem Tartrate 10 Mg Tabs (Zolpidem tartrate) .Marland Kitchen.. 1 tablet by mouth nightly as needed for sleep 10)  Lithium Carbonate 300 Mg Caps (Lithium carbonate) .... Take one tablet by mouth three times a day **rx by [facility]** 11)  Oxycodone Hcl 10 Mg Tabs (Oxycodone hcl) .... One tablet by mouth two times a day as needed for pain 12)  Gabapentin 300 Mg Caps (Gabapentin) .... Take one capsule by mouth 3 times daily *janice orsini, crnp 13)  Rozerem 8 Mg Tabs (Ramelteon) .... Take one tablet by at bedtime  rx by [facility] 14)  Polyethylene Glycol 3350 Powd (Polyethylene glycol 3350) .Marland Kitchen.. 1 cupful twice per day for bowels 15)  Lac-hydrin 12 % Lotn (Ammonium lactate) .... Apply to dry areas two times a day  Patient Instructions: 1)  Blood pressure is elevated today. Will check on next visit. 2)  Follow up in 4 weeks for medication review. 3)  Monitor BP - if still elevated on next visit will need meds Prescriptions: OXYCODONE HCL 10 MG TABS (OXYCODONE HCL) One tablet by mouth two times a day as needed for pain  #60 x 0   Entered and Authorized by:   Lehman Prom FNP   Signed by:   Lehman Prom FNP on 01/11/2010   Method used:   Print then Give to Patient   RxID:   [id number] ZOLPIDEM TARTRATE 10 MG TABS (ZOLPIDEM TARTRATE) 1 tablet by mouth nightly as needed for sleep  #30 x 0   Entered and Authorized by:   Lehman Prom FNP   Signed by:   Lehman Prom FNP on 01/11/2010   Method used:   Print then Give to Patient   RxID:   [id number] ATIVAN 1 MG  TABS (LORAZEPAM) 1 tablet by mouth two times a day as needed for nerves  #60 x 0   Entered and Authorized by:   Lehman Prom FNP   Signed by:   Lehman Prom FNP on 01/11/2010   Method used:   Print then Give to Patient   RxID:   [id number] PROMETHAZINE HCL 25 MG  TABS (PROMETHAZINE HCL) 1 tablet by mouth two times a day as needed for nausea  #60 x 0   Entered and Authorized by:   Lehman Prom FNP   Signed by:   Lehman Prom FNP on 01/11/2010   Method used:   Print then Give to Patient   RxID:   [id number]

## 2011-01-25 NOTE — Assessment & Plan Note (Signed)
Summary: Chronic Dx   Vital Signs:  Patient profile:   45 year old female LMP:     01/26/2010 Weight:      250.1 pounds Temp:     97.3 degrees F oral Pulse rate:   85 / minute Pulse rhythm:   regular Resp:     20 per minute BP sitting:   136 / 90  (left arm) Cuff size:   large  Vitals Entered By: Levon Hedger (February 05, 2010 2:19 PM) CC: pain in left knee...sore in nose that won't go away, Hypertension Management, Abdominal Pain Is Patient Diabetic? No Pain Assessment Patient in pain? yes     Location: knee  Does patient need assistance? Functional Status Self care Ambulation Normal LMP (date): 01/26/2010 LMP - Character: light     Enter LMP: 01/26/2010 Last PAP Result  Specimen Adequacy: Satisfactory for evaluation.   Interpretation/Result:Negative for intraepithelial Lesion or Malignancy.      Primary Care Provider:  Lehman Prom FNP  CC:  pain in left knee...sore in nose that won't go away, Hypertension Management, and Abdominal Pain.  History of Present Illness:  Pt into the office with for routine follow up.  Pt is doing well on her current meds.  Left knee pain - swelling on the right knee.   Applies ice to the affected area which does help the symptoms.  Admits to eating lots of sea food -beer -aged cheese -organ meat  Right nare with a sore that will not heal.  She was previously given samples of a nasal spray by ENT however she completed her supply +hoarseness continues for which she is still being followed at ENT; trying to coordinate her care with GI as she is on high dose H2 blockers to be sure reflux is not contributing to hoarseness    Dyspepsia History:      The patient has positive alarm features of dyspepsia which include history of anemia.  There is a prior history of GERD.  The patient has a prior history of documented ulcer disease.  The dominant symptom is not heartburn or acid reflux.  An H-2 blocker medication is not currently  being taken.  She has no history of a positive H. Pylori serology.  A prior EGD has been done.    Hypertension History:      She denies headache, chest pain, and palpitations.  no current meds.        Positive major cardiovascular risk factors include hypertension and current tobacco user.  Negative major cardiovascular risk factors include female age less than 70 years old, no history of diabetes or hyperlipidemia, and negative family history for ischemic heart disease.        Further assessment for target organ damage reveals no history of ASHD, cardiac end-organ damage (CHF/LVH), stroke/TIA, peripheral vascular disease, renal insufficiency, or hypertensive retinopathy.      Allergies (verified): 1)  ! * Mophine 2)  ! Prozac 3)  ! Reglan  Review of Systems General:  Denies fever. ENT:  Complains of hoarseness; right nare sore. CV:  Denies chest pain or discomfort. Resp:  Denies cough. GI:  Complains of abdominal pain and nausea. MS:  Complains of joint pain; left knee.  Physical Exam  General:  alert.   Head:  normocephalic.   Nose:  right nare - inflammed turbinate Lungs:  normal breath sounds.   Heart:  normal rate and regular rhythm.   Abdomen:  normal bowel sound hepatomegaly.   Neurologic:  alert &  oriented X3.   Skin:  mulitple tattoos Psych:  Oriented X3.     Knee Exam  Knee Exam:    Left:    Inspection:  Abnormal    Palpation:  Normal    Stability:  stable    Tenderness:  no    Swelling:  diffuse    Erythema:  no   Impression & Recommendations:  Problem # 1:  KNEE PAIN, LEFT (ICD-719.46) will check uric acid for gout Her updated medication list for this problem includes:    Oxycodone Hcl 10 Mg Tabs (Oxycodone hcl) ..... One tablet by mouth two times a day as needed for pain  Orders: T-Uric Acid (Blood) (09811-91478)  Problem # 2:  HYPERTENSION, BENIGN ESSENTIAL (ICD-401.1) DASH diet BP is stsill elevated Her updated medication list for this  problem includes:    Lisinopril 10 Mg Tabs (Lisinopril) ..... One tablet by mouth daily for blood pressure  Problem # 3:  FOOT PAIN, RIGHT (ICD-729.5) will refill pain meds will check UDS on next visit  Problem # 4:  DISORDER, BIPOLAR NOS (ICD-296.80) stable, no current psych meds or f/u. 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 1 tablet by mouth two times a day as needed for nausea 2)  Ativan 1 Mg Tabs (Lorazepam) .Marland Kitchen.. 1 tablet by mouth two times a day as needed for nerves 3)  Protonix 40 Mg Tbec (Pantoprazole sodium) .Marland Kitchen.. 1 tablet by mouth once daily 4)  Multivitamins Tabs (Multiple vitamin) .Marland Kitchen.. 1 tablet by mouth once daily 5)  Folic Acid 1 Mg Tabs (Folic acid) .Marland Kitchen.. 1 tablet by mouth once daily 6)  Thiamine Hcl 100 Mg Tabs (Thiamine hcl) .Marland Kitchen.. 1 tablet by mouth once daily 7)  Lactulose 10 Gm/13ml Soln (Lactulose) .... Take 30ml two times per day 8)  Anusol-hc 25 Mg Supp (Hydrocortisone acetate) .Marland Kitchen.. 1 pr once daily as needed 9)  Zolpidem Tartrate 10 Mg Tabs (Zolpidem tartrate) .Marland Kitchen.. 1 tablet by mouth nightly as needed for sleep 10)  Lithium Carbonate 300 Mg Caps (Lithium carbonate) .... Take one tablet by mouth three times a day **rx by [facility]** 11)  Oxycodone Hcl 10 Mg Tabs (Oxycodone hcl) .... One tablet by mouth two times a day as needed for pain 12)  Gabapentin 300 Mg Caps (Gabapentin) .... Take one capsule by mouth 3 times daily *janice orsini, crnp 13)  Rozerem 8 Mg Tabs (Ramelteon) .... Take one tablet by at bedtime  rx by [facility] 14)  Polyethylene Glycol 3350 Powd (Polyethylene glycol 3350) .Marland Kitchen.. 1 cupful twice per day for bowels 15)  Lac-hydrin 12 % Lotn (Ammonium lactate) .... Apply to dry areas two times a day 16)  Lisinopril 10 Mg Tabs (Lisinopril) .... One tablet by mouth  daily for blood pressure 17)  Fluticasone Propionate 50 Mcg/act Susp (Fluticasone propionate) .... One spray in each nostril two times a day  Hypertension Assessment/Plan:      The patient's hypertensive risk group is category B: At least one risk factor (excluding diabetes) with no target organ damage.  Her calculated 10 year risk of coronary heart disease is 8 %.  Today's blood pressure is 136/90.  Her blood pressure goal is < 140/90.  Patient Instructions: 1)  Follow up in 1 month for medication refills and hypertension 2)  Your  labs will will be checked today for gout in your knee. 3)  If so, someone will call you and let you know about the need to start medications for gout. Prescriptions: OXYCODONE HCL 10 MG TABS (OXYCODONE HCL) One tablet by mouth two times a day as needed for pain  #60 x 0   Entered and Authorized by:   Lehman Prom FNP   Signed by:   Lehman Prom FNP on 02/05/2010   Method used:   Print then Give to Patient   RxID:   [id number] ZOLPIDEM TARTRATE 10 MG TABS (ZOLPIDEM TARTRATE) 1 tablet by mouth nightly as needed for sleep  #30 x 0   Entered and Authorized by:   Lehman Prom FNP   Signed by:   Lehman Prom FNP on 02/05/2010   Method used:   Print then Give to Patient   RxID:   [id number] ATIVAN 1 MG  TABS (LORAZEPAM) 1 tablet by mouth two times a day as needed for nerves  #60 x 0   Entered and Authorized by:   Lehman Prom FNP   Signed by:   Lehman Prom FNP on 02/05/2010   Method used:   Print then Give to Patient   RxID:   [id number] PROMETHAZINE HCL 25 MG  TABS (PROMETHAZINE HCL) 1 tablet by mouth two times a day as needed for nausea  #60 x 0   Entered and Authorized by:   Lehman Prom FNP   Signed by:   Lehman Prom FNP on 02/05/2010   Method used:   Print then Give to Patient   RxID:   [id number] FLUTICASONE PROPIONATE 50 MCG/ACT SUSP (FLUTICASONE PROPIONATE) One spray in each nostril two times a day  #1 x 5    Entered and Authorized by:   Lehman Prom FNP   Signed by:   Lehman Prom FNP on 02/05/2010   Method used:   Print then Give to Patient   RxID:   [id number] LISINOPRIL 10 MG TABS (LISINOPRIL) One tablet by mouth daily for blood pressure  #30 x 5   Entered and Authorized by:   Lehman Prom FNP   Signed by:   Lehman Prom FNP on 02/05/2010   Method used:   Print then Give to Patient   RxID:   [id number]

## 2011-01-25 NOTE — Assessment & Plan Note (Signed)
Summary: Chronic Dx   Vital Signs:  Patient profile:   45 year old female LMP:     02/23/2010 Weight:      251.4 pounds BMI:     39.52 BSA:     2.23 Temp:     97.4 degrees F oral Pulse rate:   87 / minute Pulse rhythm:   regular Resp:     20 per minute BP sitting:   134 / 87  (left arm) Cuff size:   large  Vitals Entered By: Levon Hedger (March 05, 2010 2:36 PM) CC: follow-up visit 1 month medication review, Depression, Hypertension Management, Abdominal Pain Is Patient Diabetic? No Pain Assessment Patient in pain? yes     Location: leg, knee  Does patient need assistance? Functional Status Self care Ambulation Normal LMP (date): 02/23/2010 LMP - Character: light     Enter LMP: 02/23/2010 Last PAP Result  Specimen Adequacy: Satisfactory for evaluation.   Interpretation/Result:Negative for intraepithelial Lesion or Malignancy.      Primary Care Provider:  Lehman Prom FNP  CC:  follow-up visit 1 month medication review, Depression, Hypertension Management, and Abdominal Pain.  History of Present Illness:  Pt into the office for follow up.  Hoarseness - ongoing. She reports that she has cold symptoms over the past week. She has been taking some OTC cough meds.  Mucous production. Pt reports that hoarseness improves in the afternoon. She did get the nasal spray which has helped her nasal passages.  Denies any ETOH or drug use The patient denies that she feels like life is not worth living, denies that she wishes that she were dead, and denies that she has thought about ending her life.         Dyspepsia History:      She has no alarm features of dyspepsia including no history of melena, hematochezia, dysphagia, persistent vomiting, or involuntary weight loss > 5%.  There is a prior history of GERD.  The patient has a prior history of documented ulcer disease.  The dominant symptom is heartburn or acid reflux.  An H-2 blocker medication is currently being  taken.  She notes that the symptoms have not improved with the H-2 blocker therapy.  Symptoms have not persisted after 4 weeks of H-2 blocker treatment.  She has no history of a positive H. Pylori serology.  A prior EGD has been done which showed no evidence for moderate or severe esophagitis or Barrett's esophagus.    Hypertension History:      She denies headache, chest pain, and palpitations.  pt has started the bp meds as ordered on last visit.        Positive major cardiovascular risk factors include hypertension and current tobacco user.  Negative major cardiovascular risk factors include female age less than 3 years old, no history of diabetes or hyperlipidemia, and negative family history for ischemic heart disease.        Further assessment for target organ damage reveals no history of ASHD, cardiac end-organ damage (CHF/LVH), stroke/TIA, peripheral vascular disease, renal insufficiency, or hypertensive retinopathy.           Allergies (verified): 1)  ! * Mophine 2)  ! Prozac 3)  ! Reglan  Review of Systems ENT:  Complains of hoarseness. CV:  Denies chest pain or discomfort. Resp:  Denies cough. GI:  Complains of nausea; denies constipation, diarrhea, and vomiting. MS:  Complains of joint pain; leg pain .  Physical Exam  General:  alert.  Head:  normocephalic.   Lungs:  normal breath sounds.   Heart:  normal rate and regular rhythm.   Abdomen:  normal bowel sound hepatomegaly.   Neurologic:  alert & oriented X3.   Skin:  multiple tattoos Psych:  Oriented X3.  good spirits   Impression & Recommendations:  Problem # 1:  HYPERTENSION, BENIGN ESSENTIAL (ICD-401.1) DASH diet BP stable contiue current meds  Her updated medication list for this problem includes:    Lisinopril 10 Mg Tabs (Lisinopril) ..... One tablet by mouth daily for blood pressure  Problem # 2:  HOARSENESS (ICD-784.49) advised pt to f/u with ENT will change omeprazole to nexium and pt is to  continue zantac 300mg  nightly  Problem # 3:  INSOMNIA (ICD-780.52)  The following medications were removed from the medication list:    Rozerem 8 Mg Tabs (Ramelteon) .Marland Kitchen... Take one tablet by at bedtime  rx by [facility] Her updated medication list for this problem includes:    Zolpidem Tartrate 10 Mg Tabs (Zolpidem tartrate) .Marland Kitchen... 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 1 tablet by mouth two times a day as needed for nausea 2)  Ativan 1 Mg Tabs (Lorazepam) .Marland Kitchen.. 1 tablet by mouth two times a day as needed for nerves 3)  Nexium 40 Mg Cpdr (Esomeprazole magnesium) .... One tablet by mouth daily for stomach 4)  Lactulose 10 Gm/33ml Soln (Lactulose) .... Take 30ml two times per day 5)  Anusol-hc 25 Mg Supp (Hydrocortisone acetate) .Marland Kitchen.. 1 pr once daily as needed 6)  Zolpidem Tartrate 10 Mg Tabs (Zolpidem tartrate) .Marland Kitchen.. 1 tablet by mouth nightly as needed for sleep 7)  Lithium Carbonate 300 Mg Caps (Lithium carbonate) .... Take one tablet by mouth three times a day **rx by [facility]** 8)  Oxycodone Hcl 10 Mg Tabs (Oxycodone hcl) .... One tablet by mouth two times a day as needed for pain 9)  Polyethylene Glycol 3350 Powd (Polyethylene glycol 3350) .Marland Kitchen.. 1 cupful twice per day for bowels 10)  Lac-hydrin 12 % Lotn (Ammonium lactate) .... Apply to dry areas two times a day 11)  Lisinopril 10 Mg Tabs (Lisinopril) .... One tablet by mouth daily for blood pressure 12)  Fluticasone Propionate 50 Mcg/act Susp (Fluticasone propionate) .... One spray in each nostril two times a day 13)  Allopurinol 300 Mg Tabs (Allopurinol) .... One  tablets by mouth daily for gout 14)  Zantac 300 Mg Tabs (Ranitidine hcl) .... One tablet by mouth two times a day  Dyspepsia Assessment/Plan:  Step Therapy: GERD Treatment Protocols:    Step-1: started    H-2 blocker chosen: Ranitidine 150mg  by mouth at bedtime  Hypertension Assessment/Plan:      The patient's hypertensive risk group is category B: At least one risk factor (excluding diabetes) with no target organ damage.  Her calculated 10 year risk of coronary heart disease is 6 %.  Today's blood pressure is 134/87.  Her blood pressure goal is < 140/90.  Patient Instructions: 1)  Follow up in 1 month for chronic medications. 2)  Complete physical will be due in May 2011 3)  Try to take the nexium  before breakfast and see if this will help with your hoarseness.  Continue the zantac as previously ordered. Prescriptions: OXYCODONE HCL 10 MG TABS (OXYCODONE HCL) One tablet by mouth two times a day as needed for pain  #60 x 0   Entered and Authorized by:   Lehman Prom FNP   Signed by:   Lehman Prom FNP on 03/05/2010   Method used:   Print then Give to Patient   RxID:   [id number] ZOLPIDEM TARTRATE 10 MG TABS (ZOLPIDEM TARTRATE) 1 tablet by mouth nightly as needed for sleep  #30 x 0   Entered and Authorized by:   Lehman Prom FNP   Signed by:   Lehman Prom FNP on 03/05/2010   Method used:   Print then Give to Patient   RxID:   [id number] ATIVAN 1 MG  TABS (LORAZEPAM) 1 tablet by mouth two times a day as needed for nerves  #60 x 0   Entered and Authorized by:   Lehman Prom FNP   Signed by:   Lehman Prom FNP on 03/05/2010   Method used:   Print then Give to Patient   RxID:   [id number] PROMETHAZINE HCL 25 MG  TABS (PROMETHAZINE HCL) 1 tablet by mouth two times a day as needed for nausea  #60 x 0   Entered and Authorized by:   Lehman Prom FNP   Signed by:   Lehman Prom FNP on 03/05/2010   Method used:   Print then Give to Patient   RxID:    [id number] NEXIUM 40 MG CPDR (ESOMEPRAZOLE MAGNESIUM) One tablet by mouth daily for stomach  #30 x 5   Entered and Authorized by:   Lehman Prom FNP   Signed by:   Lehman Prom FNP on 03/05/2010   Method used:   Print then Give to Patient   RxID:   [id number]

## 2011-01-25 NOTE — Progress Notes (Signed)
Summary: Needs office visit/ [facility]ED[facility] REFILLS    Phone Note Outgoing Call   Summary of Call: call pt and schedule an appointment  (i spoke with her earlier today regarding her meds) she usually comes once per month but she has been in the hospital and missed last visit Initial call taken by: Lehman Prom FNP,  Apr 29, 2010 1:02 PM  Follow-up for Phone Call        [name] IS SCHEDULE ON 05/17, BUT SHE SHE SAYS  THAT SHE NEEDS REFILLS ON HER PAIN PILLS, PHENERGAN, STOMACH PILLS, MIRALAX, NERVE PILL AND HER SLEEPING PILLS. SHE USE [facility] [address]. SHE WANTS TO KNOW IF YOU WILL GIVE HER ENOUGH TILL HER APPT.  Follow-up by: Leodis Rains,  Apr 30, 2010 4:07 PM  Additional Follow-up for Phone Call Additional follow up Details #1::        phenergan and lorazepam sent to the pharmacy on yesterday. Miralax and Remus Loffler will be sent today Pt can get pain pills when she is seen at next office visit Additional Follow-up by: Lehman Prom FNP,  Apr 30, 2010 4:19 PM    Additional Follow-up for Phone Call Additional follow up Details #2::    Rx faxed to pharmacy. left message for pt to return call to the office Levon Hedger  Apr 30, 2010 5:19 PM  Levon Hedger  May 03, 2010 5:02 PM Left message on machine for pt to return call to the office.  Levon Hedger  May 04, 2010 10:18 AM pt informed of above information.  Follow-up by: Levon Hedger,  May 04, 2010 10:18 AM  Prescriptions: ZOLPIDEM TARTRATE 10 MG TABS (ZOLPIDEM TARTRATE) 1 tablet by mouth nightly as needed for sleep  #30 x 0   Entered and Authorized by:   Lehman Prom FNP   Signed by:   Lehman Prom FNP on 04/30/2010   Method used:   Printed then faxed to ...       [facility] (retail)       [address]       [place], [place]  [id number]       Ph: [phone]       Fax: [phone]   RxID:   [id number] POLYETHYLENE GLYCOL 3350  POWD (POLYETHYLENE GLYCOL 3350) 1 cupful twice  per day for bowels  #255gm x 5   Entered and Authorized by:   Lehman Prom FNP   Signed by:   Lehman Prom FNP on 04/30/2010   Method used:   Electronically to        [facility] [address] (retail)       [address]       [place], [place]  [id number]       Ph: [phone]       Fax: [phone]   RxID:   [id number]

## 2011-01-25 NOTE — Progress Notes (Signed)
Summary: LAB RESULTS   Phone Note Call from Patient Call back at [facility] Phone [phone] Call back at [phone]   Summary of Call: THE PT NEEDS HER LAB RESULTS. MARTIN FNP Initial call taken by: Manon Hilding,  May 13, 2010 2:12 PM  Follow-up for Phone Call        Forwarded to N. Daphine Deutscher -- please advise. Dutch Quint RN  May 13, 2010 2:29 PM   Additional Follow-up for Phone Call Additional follow up Details #1::        Labs were delayed with returning HOWEVER pt is positive for cocaine. i discussed with pt that as long as she is using illegal drugs and as part of the pain contract she has with this office  then then no pain meds will be filled. Pt has violated the pain contract so NO further pain meds will be written. I'm sorry that pt is in pain however if she were still detoxed as per her last hospital d/c then she would not have positive amounts in her system.  NO FURTHER NARCOTICS Additional Follow-up by: Lehman Prom FNP,  May 13, 2010 2:48 PM    Additional Follow-up for Phone Call Additional follow up Details #2::    Spoke with pt. and advised of positive results for narcotics in labs.  Explained consequences as per provider and offered suggestions for OTC pain relievers.  Verbalized understanding. Dutch Quint RN  May 14, 2010 12:05 PM   Additional Follow-up for Phone Call Additional follow up Details #3:: Details for Additional Follow-up Action Taken: noted  Additional Follow-up by: Lehman Prom FNP,  May 14, 2010 1:27 PM

## 2011-01-25 NOTE — Assessment & Plan Note (Signed)
Summary: Hospital F/u    Vital Signs:  Patient profile:   45 year old female LMP:     04/29/2010 Weight:      247.9 pounds BMI:     38.97 BSA:     2.22 Temp:     97.4 degrees F oral Pulse rate:   89 / minute Pulse rhythm:   regular Resp:     20 per minute BP sitting:   127 / 85  (left arm) Cuff size:   large  Vitals Entered By: Levon Hedger (May 11, 2010 11:24 AM) CC: hospital follow-up...pain medication Is Patient Diabetic? No Pain Assessment Patient in pain? yes     Location: leg Intensity: 8  Does patient need assistance? Functional Status Self care Ambulation Normal LMP (date): 04/29/2010 LMP - Character: light     Enter LMP: 04/29/2010 Last PAP Result  Specimen Adequacy: Satisfactory for evaluation.   Interpretation/Result:Negative for intraepithelial Lesion or Malignancy.      Primary Care Provider:  Lehman Prom FNP  CC:  hospital follow-up...pain medication.  History of Present Illness:  Pt into the office for f/u.   Recent hospitalizations  March 26 - April 5th, 2011 Pt requested detox from alcohol and cocaine.  Reported at that time she had been on a two month binge.  Denied any suicidal or homicidal ideations.  Binge after the death of her brother in 29-Jan-2023 from Cancer.  Pt was drinking at least six 40-ounce beers, malt liquors, and cheap wine daily.  Admits she was using crack to get energy to drink more. Pt was detoxed on librium, started on celexa and given seroquel at bedtime.  she was eventually switched from librium to ativan. 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 Daiva Eves Rheumatology - Dr. Alben Deeds Dermatology - Dr. Antonieta Loveless Dermatology consulted - Biopsy results revealed Panniculitis erythema nodosum Pt was started on prednisoine 60mg  for 1 week then continue 40mg  until  seen by dermatology Pt reports with extreme pain - She was given Rx for oxycontin SR 30mg  by mouth every 12 hours for pain in addition to oxycodone 5mg  IR every 6 hours as needed for breakthrough pain Pt was started on vancymycin and clindamyin and agressive diuresis with no significant improvement.  ABX D/c after biopsy results returned.  Pt was started on prednison CT and MRI of lower extremity is negative for abscess or necrotizing fascitis   Habits & Providers  Alcohol-Tobacco-Diet     Alcohol drinks/day: drinking binge from 3-03/2010     Alcohol Counseling: not indicated; use of alcohol is not excessive or problematic     Alcohol type: beer and wine     Tobacco Status: current     Cigarette Packs/Day: 1/2     Year Started: 1998  Exercise-Depression-Behavior     Does Patient Exercise: no     Depression Counseling: further diagnostic testing and/or other treatment is indicated     Drug Use: cocaine     Seat Belt Use: 100     Sun Exposure: occasionally  Allergies (verified): 1)  ! * Mophine 2)  ! Prozac 3)  ! Reglan  Social History: Drug Use:  cocaine  Review of Systems General:  Denies fever. CV:  Denies chest pain or discomfort. Resp:  Denies cough. GI:  Complains of constipation; denies abdominal pain. MS:  Complains of joint  pain; leg pain.  Physical Exam  General:  alert.   Head:  normocephalic.   Neurologic:  alert & oriented X3.   Skin:  multiple tattoos right lower extremities - no edema no discoloration  Psych:  Oriented X3.     Impression & Recommendations:  Problem # 1:  SARCOIDOSIS (ICD-135) pt will need to f/u with derm pt to continue on prednisone pt would like pain meds - will test drug screen - advised pt if positive will NOT get pain meds  Problem # 2:  ALCOHOLIC LIVER DISEASE (ICD-571.3) recent detox in March   Complete Medication List: 1)  Promethazine Hcl 25 Mg Tabs (Promethazine hcl) .Marland Kitchen.. 1 tablet by mouth two times a day as needed for  nausea 2)  Ativan 1 Mg Tabs (Lorazepam) .Marland Kitchen.. 1 tablet by mouth two times a day as needed for nerves 3)  Nexium 40 Mg Cpdr (Esomeprazole magnesium) .... One tablet by mouth daily for stomach 4)  Zolpidem Tartrate 10 Mg Tabs (Zolpidem tartrate) .Marland Kitchen.. 1 tablet by mouth nightly as needed for sleep 5)  Oxycodone Hcl 10 Mg Tabs (Oxycodone hcl) .... One tablet by mouth two times a day as needed for pain 6)  Polyethylene Glycol 3350 Powd (Polyethylene glycol 3350) .Marland Kitchen.. 1 cupful twice per day for bowels 7)  Lac-hydrin 12 % Lotn (Ammonium lactate) .... Apply to dry areas two times a day 8)  Lisinopril 10 Mg Tabs (Lisinopril) .... One tablet by mouth daily for blood pressure 9)  Fluticasone Propionate 50 Mcg/act Susp (Fluticasone propionate) .... One spray in each nostril two times a day 10)  Allopurinol 300 Mg Tabs (Allopurinol) .... One tablets by mouth daily for gout 11)  Zantac 300 Mg Tabs (Ranitidine hcl) .... One tablet by mouth two times a day 12)  Prednisone 20 Mg Tabs (Prednisone) .... Take 3 tablets by mouth once daily for 1 week, then 2 tablets once daily until seen by dermatology *ral ripudeep 13)  Furosemide 20 Mg Tabs (Furosemide) .... Take 1 tablet by mouth once daily  *ral ripudeep 14)  Amitiza 24 Mcg Caps (Lubiprostone) .... Take one capsule by mouth twice a day *ral ripudeep  Other Orders: T-Drug Alease Medina (mullti) [phone]) T-Amylase [phone]) T-Lipase [phone]) T-Drugs of Abuse 9 Panel,Serum [phone]) T-Alcohol [phone]) T-CBC w/Diff [phone])  Patient Instructions: 1)  You will be checked today.  If results negative, then pain medications will be started.  You will have to return for the medications.   X-ray  Procedure date:  03/20/2010  Findings:      right and left tibia-fibula x-ray showed no acute bony anormaility  CT Scan  Procedure date:  03/20/2010  Findings:      no signs of osteomyelitis, nonspecific edema throughout the  subcutaneous tissue in a freely distributed pattern, no localized collections to suggest abscess.  the findings would be due to cellulitis or any other systemic cause  CXR  Procedure date:  03/20/2010  Findings:      right sided PICC  MRI EXAM  Procedure date:  03/20/2010  Findings:      lower extremities - showed subcutaneous edema without sepsis no evidence of osteomylitis   X-ray  Procedure date:  03/20/2010  Findings:      right and left tibia-fibula x-ray showed no acute bony anormaility  CT Scan  Procedure date:  03/20/2010  Findings:      no signs of osteomyelitis, nonspecific edema throughout the subcutaneous tissue in a freely distributed pattern, no localized  collections to suggest abscess.  the findings would be due to cellulitis or any other systemic cause  CXR  Procedure date:  03/20/2010  Findings:      right sided PICC  MRI EXAM  Procedure date:  03/20/2010  Findings:      lower extremities - showed subcutaneous edema without sepsis no evidence of osteomylitis

## 2011-01-25 NOTE — Letter (Signed)
Summary: [facility]  [facility]   Imported By: Arta Bruce 02/09/2010 12:19:14  _____________________________________________________________________  External Attachment:    Type:   Image     Comment:   External Document

## 2011-02-24 ENCOUNTER — Other Ambulatory Visit (HOSPITAL_COMMUNITY): Payer: Self-pay

## 2011-03-02 ENCOUNTER — Other Ambulatory Visit (HOSPITAL_COMMUNITY): Payer: Medicare Other

## 2011-03-08 ENCOUNTER — Other Ambulatory Visit (HOSPITAL_COMMUNITY): Payer: Medicare Other

## 2011-03-09 ENCOUNTER — Ambulatory Visit (HOSPITAL_COMMUNITY)
Admission: RE | Admit: 2011-03-09 | Payer: Medicare Other | Source: Ambulatory Visit | Admitting: Obstetrics and Gynecology

## 2011-03-09 LAB — CBC
HCT: 26 % — ABNORMAL LOW (ref 36.0–46.0)
Hemoglobin: 9.8 g/dL — ABNORMAL LOW (ref 12.0–15.0)
MCH: 27.1 pg (ref 26.0–34.0)
MCHC: 34.1 g/dL (ref 30.0–36.0)
MCHC: 31.7 g/dL (ref 30.0–36.0)
MCV: 85.4 fL (ref 78.0–100.0)
Platelets: 129 10*3/uL — ABNORMAL LOW (ref 150–400)
RBC: 3.08 MIL/uL — ABNORMAL LOW (ref 3.87–5.11)
WBC: 4.4 10*3/uL (ref 4.0–10.5)

## 2011-03-09 LAB — COMPREHENSIVE METABOLIC PANEL
ALT: 19 U/L (ref 0–35)
AST: 29 U/L (ref 0–37)
Albumin: 3.7 g/dL (ref 3.5–5.2)
Alkaline Phosphatase: 83 U/L (ref 39–117)
CO2: 28 mEq/L (ref 19–32)
Chloride: 105 mEq/L (ref 96–112)
Creatinine, Ser: 1 mg/dL (ref 0.4–1.2)
GFR calc Af Amer: >60 mL/min (ref >60)
Potassium: 4.1 mEq/L (ref 3.5–5.1)
Sodium: 139 mEq/L (ref 135–145)
Total Bilirubin: 0.5 mg/dL (ref 0.3–1.2)

## 2011-03-09 LAB — RAPID URINE DRUG SCREEN, HOSP PERFORMED
Cocaine: POSITIVE — AB
Tetrahydrocannabinol: POSITIVE — AB

## 2011-03-09 LAB — WET PREP, GENITAL: Yeast Wet Prep HPF POC: NONE SEEN

## 2011-03-09 LAB — DIFFERENTIAL
Basophils Relative: 1 % (ref 0–1)
Eosinophils Relative: 2 % (ref 0–5)
Lymphocytes Relative: 22 % (ref 12–46)
Lymphs Abs: 0.8 10*3/uL (ref 0.7–4.0)
Monocytes Absolute: 0.2 10*3/uL (ref 0.1–1.0)
Neutro Abs: 2.6 10*3/uL (ref 1.7–7.7)
Neutrophils Relative %: 69 % (ref 43–77)

## 2011-03-09 LAB — SAMPLE TO BLOOD BANK

## 2011-03-09 LAB — PROTIME-INR
INR: 1.02 (ref 0.00–1.49)
Prothrombin Time: 13.6 seconds (ref 11.6–15.2)

## 2011-03-09 LAB — AMMONIA: Ammonia: 32 umol/L (ref 11–35)

## 2011-03-09 LAB — ETHANOL: Alcohol, Ethyl (B): <5 mg/dL (ref 0–10)

## 2011-03-10 LAB — DIFFERENTIAL
Basophils Absolute: 0 10*3/uL (ref 0.0–0.1)
Lymphocytes Relative: 33 % (ref 12–46)
Lymphs Abs: 1.5 10*3/uL (ref 0.7–4.0)
Monocytes Absolute: 0.3 10*3/uL (ref 0.1–1.0)
Neutro Abs: 2.6 10*3/uL (ref 1.7–7.7)

## 2011-03-10 LAB — RAPID URINE DRUG SCREEN, HOSP PERFORMED
Amphetamines: NOT DETECTED
Benzodiazepines: POSITIVE — AB
Cocaine: POSITIVE — AB
Opiates: POSITIVE — AB
Tetrahydrocannabinol: POSITIVE — AB

## 2011-03-10 LAB — BASIC METABOLIC PANEL
BUN: 7 mg/dL (ref 6–23)
GFR calc Af Amer: >60 mL/min (ref >60)
GFR calc non Af Amer: >60 mL/min (ref >60)
Potassium: 3.8 mEq/L (ref 3.5–5.1)

## 2011-03-10 LAB — HEPATIC FUNCTION PANEL
ALT: 28 U/L (ref 0–35)
AST: 60 U/L — ABNORMAL HIGH (ref 0–37)
Total Protein: 7.4 g/dL (ref 6.0–8.3)

## 2011-03-10 LAB — CBC
HCT: 34.8 % — ABNORMAL LOW (ref 36.0–46.0)
Platelets: 119 10*3/uL — ABNORMAL LOW (ref 150–400)
RBC: 4.15 MIL/uL (ref 3.87–5.11)
RDW: 15.7 % — ABNORMAL HIGH (ref 11.5–15.5)
WBC: 4.5 10*3/uL (ref 4.0–10.5)

## 2011-03-10 LAB — ETHANOL: Alcohol, Ethyl (B): 201 mg/dL — ABNORMAL HIGH (ref 0–10)

## 2011-03-10 LAB — URINALYSIS, ROUTINE W REFLEX MICROSCOPIC
Hgb urine dipstick: NEGATIVE
Specific Gravity, Urine: 1.024 (ref 1.005–1.030)
pH: 5 (ref 5.0–8.0)

## 2011-03-13 LAB — BASIC METABOLIC PANEL
BUN: 19 mg/dL (ref 6–23)
Calcium: 9.3 mg/dL (ref 8.4–10.5)
Chloride: 100 mEq/L (ref 96–112)
Creatinine, Ser: 1.06 mg/dL (ref 0.4–1.2)
GFR calc Af Amer: >60 mL/min (ref >60)
GFR calc non Af Amer: 56 mL/min — ABNORMAL LOW (ref >60)
GFR calc non Af Amer: >60 mL/min (ref >60)
Glucose, Bld: 163 mg/dL — ABNORMAL HIGH (ref 70–99)
Potassium: 3.8 mEq/L (ref 3.5–5.1)
Sodium: 135 mEq/L (ref 135–145)

## 2011-03-13 LAB — DIFFERENTIAL
Basophils Absolute: 0 10*3/uL (ref 0.0–0.1)
Basophils Absolute: 0 10*3/uL (ref 0.0–0.1)
Basophils Absolute: 0 10*3/uL (ref 0.0–0.1)
Basophils Relative: 0 % (ref 0–1)
Eosinophils Relative: 1 % (ref 0–5)
Lymphocytes Relative: 24 % (ref 12–46)
Lymphs Abs: 0.6 10*3/uL — ABNORMAL LOW (ref 0.7–4.0)
Lymphs Abs: 0.9 10*3/uL (ref 0.7–4.0)
Monocytes Relative: 13 % — ABNORMAL HIGH (ref 3–12)
Neutro Abs: 1 10*3/uL — ABNORMAL LOW (ref 1.7–7.7)
Neutro Abs: 2 10*3/uL (ref 1.7–7.7)
Neutro Abs: 2.4 10*3/uL (ref 1.7–7.7)
Neutrophils Relative %: 79 % — ABNORMAL HIGH (ref 43–77)

## 2011-03-13 LAB — CBC
HCT: 38.1 % (ref 36.0–46.0)
Hemoglobin: 10.4 g/dL — ABNORMAL LOW (ref 12.0–15.0)
MCH: 28.4 pg (ref 26.0–34.0)
MCHC: 33.5 g/dL (ref 30.0–36.0)
MCV: 84.3 fL (ref 78.0–100.0)
MCV: 83.8 fL (ref 78.0–100.0)
Platelets: 143 10*3/uL — ABNORMAL LOW (ref 150–400)
Platelets: 164 10*3/uL (ref 150–400)
Platelets: 108 10*3/uL — ABNORMAL LOW (ref 150–400)
RBC: 3.48 MIL/uL — ABNORMAL LOW (ref 3.87–5.11)
RBC: 4.54 MIL/uL (ref 3.87–5.11)
RDW: 16.6 % — ABNORMAL HIGH (ref 11.5–15.5)
RDW: 17.2 % — ABNORMAL HIGH (ref 11.5–15.5)
WBC: 3.7 10*3/uL — ABNORMAL LOW (ref 4.0–10.5)

## 2011-03-13 LAB — HEPATIC FUNCTION PANEL
ALT: 29 U/L (ref 0–35)
AST: 51 U/L — ABNORMAL HIGH (ref 0–37)
AST: 61 U/L — ABNORMAL HIGH (ref 0–37)
Albumin: 3.7 g/dL (ref 3.5–5.2)
Alkaline Phosphatase: 111 U/L (ref 39–117)
Indirect Bilirubin: 0.7 mg/dL (ref 0.3–0.9)
Total Protein: 7.1 g/dL (ref 6.0–8.3)
Total Protein: 7.7 g/dL (ref 6.0–8.3)

## 2011-03-13 LAB — RAPID URINE DRUG SCREEN, HOSP PERFORMED
Amphetamines: NOT DETECTED
Benzodiazepines: POSITIVE — AB
Cocaine: POSITIVE — AB
Tetrahydrocannabinol: NOT DETECTED

## 2011-03-13 LAB — CULTURE, BLOOD (ROUTINE X 2): Culture: NO GROWTH

## 2011-03-13 LAB — TRICYCLICS SCREEN, URINE: TCA Scrn: NOT DETECTED

## 2011-03-13 LAB — AMMONIA: Ammonia: 23 umol/L (ref 11–35)

## 2011-03-13 LAB — ETHANOL: Alcohol, Ethyl (B): <5 mg/dL (ref 0–10)

## 2011-03-15 LAB — COMPREHENSIVE METABOLIC PANEL
AST: 22 U/L (ref 0–37)
Albumin: 3 g/dL — ABNORMAL LOW (ref 3.5–5.2)
CO2: 29 mEq/L (ref 19–32)
Creatinine, Ser: 0.83 mg/dL (ref 0.4–1.2)
Potassium: 4.7 mEq/L (ref 3.5–5.1)
Sodium: 138 mEq/L (ref 135–145)
Total Bilirubin: 0.6 mg/dL (ref 0.3–1.2)

## 2011-03-15 LAB — URINALYSIS, ROUTINE W REFLEX MICROSCOPIC
Glucose, UA: NEGATIVE mg/dL
Specific Gravity, Urine: 1.019 (ref 1.005–1.030)
pH: 6 (ref 5.0–8.0)

## 2011-03-15 LAB — MISCELLANEOUS TEST

## 2011-03-15 LAB — BASIC METABOLIC PANEL
BUN: 20 mg/dL (ref 6–23)
BUN: 20 mg/dL (ref 6–23)
Calcium: 9.4 mg/dL (ref 8.4–10.5)
Chloride: 88 mEq/L — ABNORMAL LOW (ref 96–112)
Chloride: 94 mEq/L — ABNORMAL LOW (ref 96–112)
Creatinine, Ser: 1.14 mg/dL (ref 0.4–1.2)
Creatinine, Ser: 1.17 mg/dL (ref 0.4–1.2)
GFR calc Af Amer: >60 mL/min (ref >60)
GFR calc non Af Amer: 50 mL/min — ABNORMAL LOW (ref >60)
Glucose, Bld: 133 mg/dL — ABNORMAL HIGH (ref 70–99)

## 2011-03-15 LAB — URINE MICROSCOPIC-ADD ON

## 2011-03-15 LAB — CBC
HCT: 26.4 % — ABNORMAL LOW (ref 36.0–46.0)
HCT: 30.3 % — ABNORMAL LOW (ref 36.0–46.0)
Hemoglobin: 8.6 g/dL — ABNORMAL LOW (ref 12.0–15.0)
Hemoglobin: 9.9 g/dL — ABNORMAL LOW (ref 12.0–15.0)
MCHC: 32.7 g/dL (ref 30.0–36.0)
MCV: 87.9 fL (ref 78.0–100.0)
MCV: 88.1 fL (ref 78.0–100.0)
Platelets: 148 10*3/uL — ABNORMAL LOW (ref 150–400)
Platelets: 166 10*3/uL (ref 150–400)
RBC: 2.98 MIL/uL — ABNORMAL LOW (ref 3.87–5.11)
RDW: 14 % (ref 11.5–15.5)
RDW: 14.2 % (ref 11.5–15.5)
RDW: 14.6 % (ref 11.5–15.5)
WBC: 2.7 10*3/uL — ABNORMAL LOW (ref 4.0–10.5)
WBC: 4.6 10*3/uL (ref 4.0–10.5)

## 2011-03-15 LAB — HISTOPLASMA ANTIGEN, URINE

## 2011-03-15 LAB — FERRITIN: Ferritin: 12 ng/mL (ref 10–291)

## 2011-03-15 LAB — SJOGRENS SYNDROME-A EXTRACTABLE NUCLEAR ANTIBODY: SSA (Ro) (ENA) Antibody, IgG: <0.2 AI (ref <1.0)

## 2011-03-15 LAB — LACTATE DEHYDROGENASE: LDH: 170 U/L (ref 94–250)

## 2011-03-15 LAB — RPR: RPR Ser Ql: NONREACTIVE

## 2011-03-15 LAB — QUANTIFERON TB GOLD ASSAY (BLOOD)

## 2011-03-15 LAB — AMYLASE: Amylase: 26 U/L (ref 0–105)

## 2011-03-16 LAB — CBC
HCT: 28.9 % — ABNORMAL LOW (ref 36.0–46.0)
HCT: 32.9 % — ABNORMAL LOW (ref 36.0–46.0)
Hemoglobin: 10.3 g/dL — ABNORMAL LOW (ref 12.0–15.0)
Hemoglobin: 11 g/dL — ABNORMAL LOW (ref 12.0–15.0)
Hemoglobin: 9.9 g/dL — ABNORMAL LOW (ref 12.0–15.0)
MCHC: 32.7 g/dL (ref 30.0–36.0)
MCHC: 32.9 g/dL (ref 30.0–36.0)
MCHC: 33.4 g/dL (ref 30.0–36.0)
MCHC: 33.5 g/dL (ref 30.0–36.0)
MCHC: 33.8 g/dL (ref 30.0–36.0)
MCV: 87.9 fL (ref 78.0–100.0)
MCV: 88.2 fL (ref 78.0–100.0)
MCV: 88.4 fL (ref 78.0–100.0)
MCV: 88.8 fL (ref 78.0–100.0)
MCV: 90.1 fL (ref 78.0–100.0)
Platelets: 116 10*3/uL — ABNORMAL LOW (ref 150–400)
Platelets: 135 10*3/uL — ABNORMAL LOW (ref 150–400)
Platelets: 150 10*3/uL (ref 150–400)
Platelets: 117 10*3/uL — ABNORMAL LOW (ref 150–400)
RBC: 3.29 MIL/uL — ABNORMAL LOW (ref 3.87–5.11)
RBC: 3.46 MIL/uL — ABNORMAL LOW (ref 3.87–5.11)
RBC: 3.55 MIL/uL — ABNORMAL LOW (ref 3.87–5.11)
RBC: 3.58 MIL/uL — ABNORMAL LOW (ref 3.87–5.11)
RBC: 3.72 MIL/uL — ABNORMAL LOW (ref 3.87–5.11)
RBC: 3.8 MIL/uL — ABNORMAL LOW (ref 3.87–5.11)
RDW: 13.9 % (ref 11.5–15.5)
RDW: 14.1 % (ref 11.5–15.5)
WBC: 2.1 10*3/uL — ABNORMAL LOW (ref 4.0–10.5)
WBC: 2.4 10*3/uL — ABNORMAL LOW (ref 4.0–10.5)
WBC: 2.5 10*3/uL — ABNORMAL LOW (ref 4.0–10.5)
WBC: 3 10*3/uL — ABNORMAL LOW (ref 4.0–10.5)
WBC: 2.1 10*3/uL — ABNORMAL LOW (ref 4.0–10.5)

## 2011-03-16 LAB — BASIC METABOLIC PANEL
BUN: 10 mg/dL (ref 6–23)
BUN: 11 mg/dL (ref 6–23)
BUN: 13 mg/dL (ref 6–23)
BUN: 22 mg/dL (ref 6–23)
CO2: 31 mEq/L (ref 19–32)
CO2: 32 mEq/L (ref 19–32)
CO2: 33 mEq/L — ABNORMAL HIGH (ref 19–32)
CO2: 38 mEq/L — ABNORMAL HIGH (ref 19–32)
Calcium: 8.3 mg/dL — ABNORMAL LOW (ref 8.4–10.5)
Calcium: 8.5 mg/dL (ref 8.4–10.5)
Calcium: 8.5 mg/dL (ref 8.4–10.5)
Calcium: 8.7 mg/dL (ref 8.4–10.5)
Chloride: 82 mEq/L — ABNORMAL LOW (ref 96–112)
Chloride: 83 mEq/L — ABNORMAL LOW (ref 96–112)
Chloride: 88 mEq/L — ABNORMAL LOW (ref 96–112)
Chloride: 95 mEq/L — ABNORMAL LOW (ref 96–112)
Creatinine, Ser: 0.83 mg/dL (ref 0.4–1.2)
Creatinine, Ser: 0.88 mg/dL (ref 0.4–1.2)
Creatinine, Ser: 1.02 mg/dL (ref 0.4–1.2)
Creatinine, Ser: 1.32 mg/dL — ABNORMAL HIGH (ref 0.4–1.2)
GFR calc Af Amer: 53 mL/min — ABNORMAL LOW (ref >60)
GFR calc Af Amer: >60 mL/min (ref >60)
GFR calc Af Amer: >60 mL/min (ref >60)
GFR calc non Af Amer: 38 mL/min — ABNORMAL LOW (ref >60)
GFR calc non Af Amer: 59 mL/min — ABNORMAL LOW (ref >60)
GFR calc non Af Amer: >60 mL/min (ref >60)
GFR calc non Af Amer: >60 mL/min (ref >60)
Glucose, Bld: 102 mg/dL — ABNORMAL HIGH (ref 70–99)
Glucose, Bld: 110 mg/dL — ABNORMAL HIGH (ref 70–99)
Glucose, Bld: 117 mg/dL — ABNORMAL HIGH (ref 70–99)
Glucose, Bld: 146 mg/dL — ABNORMAL HIGH (ref 70–99)
Potassium: 2 mEq/L — CL (ref 3.5–5.1)
Potassium: 2.9 mEq/L — ABNORMAL LOW (ref 3.5–5.1)
Sodium: 132 mEq/L — ABNORMAL LOW (ref 135–145)
Sodium: 133 mEq/L — ABNORMAL LOW (ref 135–145)
Sodium: 135 mEq/L (ref 135–145)

## 2011-03-16 LAB — COMPREHENSIVE METABOLIC PANEL
AST: 42 U/L — ABNORMAL HIGH (ref 0–37)
AST: 55 U/L — ABNORMAL HIGH (ref 0–37)
Albumin: 3.5 g/dL (ref 3.5–5.2)
Alkaline Phosphatase: 136 U/L — ABNORMAL HIGH (ref 39–117)
BUN: 14 mg/dL (ref 6–23)
CO2: 29 mEq/L (ref 19–32)
CO2: 39 mEq/L — ABNORMAL HIGH (ref 19–32)
Calcium: 8.9 mg/dL (ref 8.4–10.5)
Calcium: 9.5 mg/dL (ref 8.4–10.5)
Chloride: 101 mEq/L (ref 96–112)
Creatinine, Ser: 0.99 mg/dL (ref 0.4–1.2)
Creatinine, Ser: 1.18 mg/dL (ref 0.4–1.2)
GFR calc Af Amer: 60 mL/min — ABNORMAL LOW (ref >60)
GFR calc Af Amer: >60 mL/min (ref >60)
GFR calc Af Amer: >60 mL/min (ref >60)
GFR calc non Af Amer: 50 mL/min — ABNORMAL LOW (ref >60)
GFR calc non Af Amer: >60 mL/min (ref >60)
Glucose, Bld: 154 mg/dL — ABNORMAL HIGH (ref 70–99)
Glucose, Bld: 98 mg/dL (ref 70–99)
Potassium: 4.3 mEq/L (ref 3.5–5.1)
Sodium: 136 mEq/L (ref 135–145)
Total Bilirubin: 0.8 mg/dL (ref 0.3–1.2)

## 2011-03-16 LAB — HIV-1 RNA QUANT-NO REFLEX-BLD: HIV 1 RNA Quant: <48 copies/mL (ref <48)

## 2011-03-16 LAB — MAGNESIUM
Magnesium: 1.8 mg/dL (ref 1.5–2.5)
Magnesium: 1.9 mg/dL (ref 1.5–2.5)

## 2011-03-16 LAB — VANCOMYCIN, TROUGH
Vancomycin Tr: 15.1 ug/mL (ref 10.0–20.0)
Vancomycin Tr: 24.2 ug/mL — ABNORMAL HIGH (ref 10.0–20.0)
Vancomycin Tr: 26.7 ug/mL (ref 10.0–20.0)

## 2011-03-16 LAB — CRYOGLOBULIN

## 2011-03-16 LAB — CULTURE, BLOOD (ROUTINE X 2)
Culture: NO GROWTH
Culture: NO GROWTH

## 2011-03-16 LAB — DIFFERENTIAL
Basophils Absolute: 0 10*3/uL (ref 0.0–0.1)
Lymphocytes Relative: 31 % (ref 12–46)
Lymphs Abs: 0.6 10*3/uL — ABNORMAL LOW (ref 0.7–4.0)
Neutro Abs: 1.2 10*3/uL — ABNORMAL LOW (ref 1.7–7.7)
Neutrophils Relative %: 55 % (ref 43–77)

## 2011-03-16 LAB — ANTI-NEUTROPHIL ANTIBODY

## 2011-03-16 LAB — AFB CULTURE WITH SMEAR (NOT AT ARMC)

## 2011-03-16 LAB — FUNGUS CULTURE W SMEAR: Fungal Smear: NONE SEEN

## 2011-03-16 LAB — HCV RNA QUANT

## 2011-03-16 LAB — HEPATITIS PANEL, ACUTE: Hepatitis B Surface Ag: NEGATIVE

## 2011-03-16 LAB — ANAEROBIC CULTURE: Gram Stain: NONE SEEN

## 2011-03-16 LAB — SEDIMENTATION RATE: Sed Rate: 28 mm/hr — ABNORMAL HIGH (ref 0–22)

## 2011-03-16 LAB — TISSUE CULTURE: Gram Stain: NONE SEEN

## 2011-03-16 LAB — PROTIME-INR: Prothrombin Time: 13.2 seconds (ref 11.6–15.2)

## 2011-03-16 LAB — TSH: TSH: 3.963 u[IU]/mL (ref 0.350–4.500)

## 2011-03-18 LAB — CBC
MCHC: 33.2 g/dL (ref 30.0–36.0)
MCV: 89.3 fL (ref 78.0–100.0)
Platelets: 65 10*3/uL — ABNORMAL LOW (ref 150–400)
RBC: 4.14 MIL/uL (ref 3.87–5.11)

## 2011-03-18 LAB — RAPID URINE DRUG SCREEN, HOSP PERFORMED
Amphetamines: NOT DETECTED
Barbiturates: NOT DETECTED
Opiates: NOT DETECTED

## 2011-03-18 LAB — COMPREHENSIVE METABOLIC PANEL
AST: 107 U/L — ABNORMAL HIGH (ref 0–37)
CO2: 23 mEq/L (ref 19–32)
Calcium: 8.6 mg/dL (ref 8.4–10.5)
Creatinine, Ser: 0.66 mg/dL (ref 0.4–1.2)
GFR calc Af Amer: >60 mL/min (ref >60)
GFR calc non Af Amer: >60 mL/min (ref >60)

## 2011-03-18 LAB — PROTIME-INR: INR: 1.07 (ref 0.00–1.49)

## 2011-03-20 LAB — CBC
HCT: 36.2 % (ref 36.0–46.0)
Hemoglobin: 12.4 g/dL (ref 12.0–15.0)
MCHC: 32.8 g/dL (ref 30.0–36.0)
MCHC: 34.4 g/dL (ref 30.0–36.0)
MCV: 89.4 fL (ref 78.0–100.0)
MCV: 90.2 fL (ref 78.0–100.0)
MCV: 91 fL (ref 78.0–100.0)
Platelets: 69 10*3/uL — ABNORMAL LOW (ref 150–400)
Platelets: 70 10*3/uL — ABNORMAL LOW (ref 150–400)
RBC: 4.02 MIL/uL (ref 3.87–5.11)
RDW: 13.9 % (ref 11.5–15.5)
RDW: 14.6 % (ref 11.5–15.5)
WBC: 1.6 10*3/uL — ABNORMAL LOW (ref 4.0–10.5)

## 2011-03-20 LAB — DIFFERENTIAL
Basophils Absolute: 0 10*3/uL (ref 0.0–0.1)
Eosinophils Absolute: 0 10*3/uL (ref 0.0–0.7)
Eosinophils Relative: 3 % (ref 0–5)
Lymphocytes Relative: 31 % (ref 12–46)
Lymphs Abs: 0.5 10*3/uL — ABNORMAL LOW (ref 0.7–4.0)
Neutrophils Relative %: 57 % (ref 43–77)

## 2011-03-20 LAB — COMPREHENSIVE METABOLIC PANEL
BUN: 10 mg/dL (ref 6–23)
CO2: 29 mEq/L (ref 19–32)
Calcium: 9.2 mg/dL (ref 8.4–10.5)
Creatinine, Ser: 0.8 mg/dL (ref 0.4–1.2)
GFR calc Af Amer: >60 mL/min (ref >60)
GFR calc non Af Amer: >60 mL/min (ref >60)
Glucose, Bld: 102 mg/dL — ABNORMAL HIGH (ref 70–99)
Total Bilirubin: 1 mg/dL (ref 0.3–1.2)

## 2011-03-20 LAB — LIPID PANEL
Cholesterol: 214 mg/dL — ABNORMAL HIGH (ref 0–200)
HDL: 60 mg/dL (ref >39)
LDL Cholesterol: 136 mg/dL — ABNORMAL HIGH (ref 0–99)
Triglycerides: 92 mg/dL (ref <150)

## 2011-03-20 LAB — HEPATIC FUNCTION PANEL
ALT: 32 U/L (ref 0–35)
Alkaline Phosphatase: 122 U/L — ABNORMAL HIGH (ref 39–117)
Indirect Bilirubin: 0.8 mg/dL (ref 0.3–0.9)
Total Bilirubin: 1.2 mg/dL (ref 0.3–1.2)

## 2011-03-20 LAB — PROTIME-INR
INR: 1.01 (ref 0.00–1.49)
Prothrombin Time: 13.2 seconds (ref 11.6–15.2)

## 2011-03-20 LAB — TSH: TSH: 3.308 u[IU]/mL (ref 0.350–4.500)

## 2011-03-29 LAB — POCT URINALYSIS DIP (DEVICE)
Glucose, UA: NEGATIVE mg/dL
Nitrite: NEGATIVE
Urobilinogen, UA: 0.2 mg/dL (ref 0.0–1.0)

## 2011-04-03 LAB — POCT HEMOGLOBIN-HEMACUE: Hemoglobin: 13.2 g/dL (ref 12.0–15.0)

## 2011-04-07 LAB — CBC
MCHC: 32.6 g/dL (ref 30.0–36.0)
Platelets: 202 10*3/uL (ref 150–400)
RDW: 16.8 % — ABNORMAL HIGH (ref 11.5–15.5)

## 2011-04-07 LAB — COMPREHENSIVE METABOLIC PANEL
ALT: 30 U/L (ref 0–35)
Albumin: 4.2 g/dL (ref 3.5–5.2)
Alkaline Phosphatase: 76 U/L (ref 39–117)
BUN: 8 mg/dL (ref 6–23)
Calcium: 9.5 mg/dL (ref 8.4–10.5)
Potassium: 4.4 mEq/L (ref 3.5–5.1)
Sodium: 142 mEq/L (ref 135–145)
Total Protein: 7.3 g/dL (ref 6.0–8.3)

## 2011-04-07 LAB — DIFFERENTIAL
Basophils Relative: 0 % (ref 0–1)
Lymphs Abs: 1.2 10*3/uL (ref 0.7–4.0)
Monocytes Absolute: 0.3 10*3/uL (ref 0.1–1.0)
Monocytes Relative: 6 % (ref 3–12)
Neutro Abs: 3.4 10*3/uL (ref 1.7–7.7)

## 2011-04-07 LAB — PREGNANCY, URINE: Preg Test, Ur: NEGATIVE

## 2011-04-07 LAB — PLATELET FUNCTION ASSAY: Collagen / Epinephrine: 124 seconds (ref 0–180)

## 2011-04-07 LAB — PROTIME-INR: INR: 1.1 (ref 0.00–1.49)

## 2011-04-07 LAB — APTT: aPTT: 33 seconds (ref 24–37)
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
## 2011-05-10 NOTE — Discharge Summary (Signed)
Erin [name], Erin [name]             ACCOUNT NO.:  [id number]   MEDICAL RECORD NO.:  [id number]          PATIENT TYPE:  INP   LOCATION:  5125                         FACILITY:  [facility]   PHYSICIAN:  Madaline Savage, MD        DATE OF BIRTH:  [birth year]   DATE OF ADMISSION:  07/18/2007  DATE OF DISCHARGE:  07/18/2007                               DISCHARGE SUMMARY   PRIMARY CARE PHYSICIAN:  This patient is unassigned to [place].   DISCHARGE DIAGNOSES:  1. Rectal bleeding secondary to hemorrhoids.  2. Polysubstance abuse.  3. Alcoholism.  4. Schizophrenia.  5. Bipolar disorder.   DISCHARGE MEDICATIONS:  1. Ativan 1 mg every 4-6 hours as needed.  2. Protonix 40 mg twice daily.  3. Folic acid 1 mg daily.  4. Thiamine 100 mg daily.  5. Lactulose 15 mg twice daily.  6. Ambien 5 mg at bedtime as needed.   HISTORY OF PRESENT ILLNESS:  For full history and physical see the  history of physical dictated by Dr. Salome Arnt on July 18, 2007.  In  short, [name] is a 45 year old lady who was recently discharged from  the hospital 6 days ago when she came here with GI bleed.  She was  evaluated by gastroenterologist at that time, and she underwent an upper  endoscopy which showed esophageal erosions.  She now comes in with  complaints of rectal bleeding.  She apparently had a colonoscopy a few  months ago which showed hemorrhoids.   PROBLEMS:  1. Rectal bleeding.  She was admitted for observation of her rectal      bleed.  Her hemoglobin at the time of discharge was 9.2, and her      hemoglobin has remained the same.  To date, her two values of      hemoglobin here the lowest one was 8.9 which is close to her      discharge hemoglobin.  I do not think she is actively bleeding.      She has not had any more bleeding which was witnessed in the      hospital, and she has not had any more bleed since she came to the      hospital.  At this time, I think she is stable to be discharged      from the  medical standpoint.  2. Polysubstance abuse.  She has been set up to see outpatient rehab.      I will put her on Ativan at this point of time.   DISPOSITION:  She will now be discharged home in stable condition.  She  is asked to keep a follow-up appointment as before as directed before.      Madaline Savage, MD  Electronically Signed     PKN/MEDQ  D:  07/18/2007  T:  07/18/2007  Job:  [id number]
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
## 2011-05-10 NOTE — Discharge Summary (Signed)
[name], [name]             ACCOUNT NO.:  [id number]   MEDICAL RECORD NO.:  [id number]          PATIENT TYPE:  INP   LOCATION:  6738                         FACILITY:  [facility]   PHYSICIAN:  Erin I Elsaid, MD      DATE OF BIRTH:  1966-04-29   DATE OF ADMISSION:  07/05/2007  DATE OF DISCHARGE:  07/10/2007                               DISCHARGE SUMMARY   PRIMARY CARE PHYSICIAN:  Unassigned to [place].   DISCHARGE DIAGNOSES:  1. Esophageal erosion/esophagitis.  2. Superficial gastric ulceration, multiple, in the antrum.  3. Anemia secondary to the hematemesis.  4. Pancytopenia secondary to alcoholism.  5. Thrombocytopenia secondary to alcoholism.  6. Nephrosclerosis with no evidence of hepatic mass or portal      hypertension at this diagnosis.  7. Alcoholism with history of delirium tremens.  8. Polysubstance abuse, cocaine, alcohol and marijuana.  9. Hemorrhoids.  10.Bipolar/schizophrenia.  11.Status post cholecystectomy, status post tonsillectomy  .  12.History of seizure and DTS in the past.   DISCHARGE MEDICATIONS:  1. Protonix 40 mg p.o. b.i.d.  2. Folic acid 1 mg p.o. daily.  3. Thiamine 100 mg p.o. daily.  4. Lactulose 15 mg p.o. b.i.d.  5. Ativan 0.5 mg q.8h. p.r.n.  6. Ambien 5 mg p.o. nightly p.r.n. for insomnia.   CONSULTATIONS:  1. Gastroenterology consulted for the hematemesis.  2. Psychiatry consulted for the anxiety disorder and polysubstance      abuse, depression and schizophrenia.  3. Hematology consulted for neutropenia, white blood cells of 1.   PROCEDURES:  1. Abdominal x-ray on July 08, 2007, no acute abdominal finding.  2. Ultrasound of the abdomen on July 08, 2007.  3. Cholecystectomy.  Diffuse hepatocellular disease with probable site      infiltration, questions of fibrosis as well.  Splenomegaly, no      biliary ductal dilatation.  4. HIV test which was negative.  5. Hepatitis C antibody negative.  6. Heparin A antibody negative.   HISTORY  OF PRESENT ILLNESS:  Please review the history done by Dr. Mikeal [name]  on the date of admission.   PROBLEM LIST:  PROBLEM #1 -  HEMATEMESIS:  Patient was admitted to the  hospital with hemoglobin not significantly dropped.  Gastroenterology  was consulted, done by [facility], where patient has already had  colonoscopy in the past which showed hemorrhoids.  EGD was done July 07, 2007, which showed esophageal erosion and esophagitis and superficial  gastric ulcers.  Recommendation was a PPI b.i.d. and stop alcohol.  During hospitalization, hemoglobin remained stable with no requirement  for blood transfusion.  As the patient has a history of alcoholic liver  cirrhosis, ultrasound was done to evaluate for hePatoma.  Ultrasound was  negative.  There is no evidence of portal hypertension.  Patient will be  discharged on Protonix twice a day and further follow-up to be done as  an outpatient.   PROBLEM #2 -  PANCYTOPENIA:  White blood cells dropped to 1.5 with ANC  0.7.  There is no evidence of infection.  Hematology was consulted for  further evaluation of causes  of neutropenia and if the patient would  benefit from Neupogen.  Hematology consulted and they think most  probably is secondary to alcohol use and should be clear within 2-3  weeks after avoiding alcohol.   PROBLEM #3 -  ALCOHOLISM WITH POLYSUBSTANCE ABUSE:  During  hospitalization patient had a visible panic attack.  Psychiatry was  consulted, Dr. Jeanie [name].  He recommended 12-step group with goal of  maintaining abstinence .  He recommended Ativan to be continued for  panic attack.  Regarding the patient's psychiatric follow-up, patient  needs to be admitted for chemical intoxication intensive outpatient  program.  Recommendations were to go ahead with chemical dependence  intensive outpatient program at one of the local hospitals.  Regarding  treating panic attacks with benzodiazepines in a patient with  polysubstance dependence, it  is recommended that the patient have an  outpatient Erin [name] of the prescription and that it be given in limited  quality and appointment be arranged more frequently .   PROBLEM #4 -  JAUNDICE SECONDARY TO ALCOHOLISM   PROBLEM #5 -  HYPONATREMIA:  Corrected.   On the date of discharge, vital signs were temperature 98.5, pulse 93,  respiratory rate 18, blood pressure 103/67.  CBC with wbc 1.5,  hemoglobin 9.2, hematocrit 27, platelets 91.  Sodium 134, potassium 4.1,  chloride 102, BUN 2, creatinine 0.72, glucose 118.   It was felt that the patient is medically stable to be discharged and  case management working on the chemical dependency intensive outpatient  program at one of the local hospitals to be arranged with the patient.      Erin Bosie Helper, MD  Electronically Signed     HIE/MEDQ  D:  07/10/2007  T:  07/10/2007  Job:  [id number]
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
## 2011-05-10 NOTE — Consult Note (Signed)
Erin [name], Erin [name]             ACCOUNT NO.:  [id number]   MEDICAL RECORD NO.:  [id number]          PATIENT TYPE:  INP   LOCATION:  6738                         FACILITY:  [facility]   PHYSICIAN:  Antonietta Breach, M.D.  DATE OF BIRTH:  [birth year]   DATE OF CONSULTATION:  07/09/2007  DATE OF DISCHARGE:                                 CONSULTATION   REASON FOR CONSULTATION:  Depression, anxiety, and substance abuse  issues.   HISTORY OF PRESENT ILLNESS:  [name] is a 45 year old female admitted  to the [facility] on the 10th of July 2008 for vomiting blood and  bright red blood per rectum.   [name] acknowledges that she went back to a regular pattern of using  alcohol and cocaine.  She states that she had adverse effects from her  psychotropic medications and that to treat her anxiety she naturally  goes back to alcohol.   She describes having at least four panic attacks per month that involve  the following nature:  approximately 5-10 minutes of thoughts of doom  and dread, palpitations, sweats, and shortness of breath.  This set of  symptoms just subsides; however, she is left with fear of another  attack.  She has had this type of problem for years.   Regarding her mood she is afraid of possible medical diagnoses and has  been crying off-and-on for the past couple of days; however, she denies  any depressed mood.  She has a normal level of interest in future goals.  She has hope.  She has no thoughts of harming herself or others.  She  has no delusions or hallucinations.  She has been cooperative with  staff.  She has intact orientation and memory function.   The patient was placed on an Ativan taper and has been tolerating that  well.   PAST PSYCHIATRIC HISTORY:  [name] has bipolar disorder listed in the  medical record.  The undersigned reviewed her past record which showed  multiple psychiatric admissions.  The patient gives a history consistent  with the past  medical record.  However, she denies ever having an  experience of decreased need for sleep, racing thoughts, high energy  lasting for several days.   The patient denies any history of hallucinations or delusions.   The patient has been admitted to psychiatric hospitals at least seven  times.  She has been at [facility], [facility] multiple  times, and stated hospitals in [place] and [place].   She has attempted suicide a number of times.  She has been tried on  multiple medications and has undergone a course of electroconvulsive  therapy.   Her psychotropic trials include Depakote, Prozac, Paxil, Risperdal.   The patient became highly restless and violent with Prozac.  When she  took Paxil she developed severe apathy and social withdrawal.   The patient states that she gained an enormous amount of weight with  Risperdal.  She could not tolerate Depakote. She states that the only  medication that she has benefited from from her perspective has been  Ambien.  She  appreciated how the Ambien would help her have consistent  sleep which helped her with her mood.  She did try trazodone and became  confused and lethargic on it.   The patient is highly resistant to trying any new psychotropic  medication.   Regarding her substance history she does have a history of several bouts  of severe alcohol use.  She has liver cirrhosis.  She has taken cocaine  in the form of several binges as well as smoking marijuana.  She is  motivated for a rehabilitation program that will help her achieve  abstinence and sobriety again.   PAST MEDICAL HISTORY:  Liver cirrhosis, pancytopenia, obesity.   ALLERGIES:  PROZAC, MORPHINE SULFATE, TYLENOL.   MEDICATIONS:  MAR is reviewed.  1. Folic acid 1 mg daily.  2. Ativan taper.  3. Multivitamin daily.  4. Thiamine 100 mg daily.   LABORATORY DATA:  Hepatitis A antibodies negative.  Hepatitis C antibody  negative.  WBC 1.5, hemoglobin 9.2,  platelet count 91.  Comprehensive  metabolic panel shows BUN 2, creatinine 0.72.  SGOT 41, SGPT 18.  Albumin 2.  Ammonia level on the 13th of July was high at 80.  TSH on  11th of July was within normal limits at 5.387.   REVIEW OF SYSTEMS:  CONSTITUTIONAL:  Afebrile.  No weight loss.  HEAD:  No trauma.  EYES:  No visual changes.  EARS:  No hearing impairment.  NOSE:  No rhinorrhea.  MOUTH/THROAT:  No sore throat.  NEUROLOGIC:  Unremarkable.  PSYCHIATRIC:  As above.  Panic disorder with agoraphobia.  CARDIOVASCULAR:  No chest pain, palpitations.  RESPIRATORY:  No coughing  or wheezing.  GASTROINTESTINAL:  As above.  GENITOURINARY:  No dysuria.  SKIN:  The patient has multiple tattoos distributed across all the  extremities.  MUSCULOSKELETAL:  No deformities.  HEMATOLOGIC:  As above.  ENDOCRINE:  Metabolic unremarkable.   PHYSICAL EXAMINATION:  VITAL SIGNS:  Temperature 98.1, pulse 89,  respirations 22, blood pressure 94/56, O2 saturation on room air 97%.  GENERAL APPEARANCE:  A middle-aged female sitting up in her hospital bed  with good eye contact and normal body habitus.  She has no abnormal  involuntary movements.   OTHER MENTAL STATUS EXAMINATION:  Alert, attention span is within normal  limits.  She is oriented to all spheres.  Memory is intact to immediate  recent and remote.  Fund of knowledge and intelligence are within normal  limits.  Thought process is logical, coherent, goal-directed.  No  looseness of association.  Her speech involves normal rate and prosody  without dysarthria.  Language expression and comprehension are within  normal limits.  She does have intact abstraction and calculating  abilities.  Affect is slightly anxious, mood is within normal limits.  Thought content:  No thoughts of harming herself, no thoughts of harming  others, no delusions, no hallucinations.  Insight is intact, judgment is  within normal limits.  The patient is well-groomed.    ASSESSMENT:  AXIS I:  293.84 anxiety disorder not otherwise specified  (functional and general medical potential factors).  Rule out panic  disorder with agoraphobia.  The patient does note that panic is  triggered by certain environments.  Polysubstance dependence, 293.83  mood disorder not otherwise specified.  The possibility of bipolar  disorder cannot be ruled out.  AXIS II:  Deferred.  AXIS III:  See general medical problems.  AXIS IV:  General medical and primary support group.  AXIS V:  35.   [name] is not at risk to harm herself or others.  She agrees to use  emergency services immediately for any thoughts of harming herself,  thoughts of harming others or other emergency psychiatric symptoms.   The undersigned provided ego supportive psychotherapy and education.  The psychotropic alternatives were discussed at length.  The patient has  had severe and discouraging side effects from a number of first line  agents for both mood and anxiety control.  She has not tolerated SSRIs.  She has a history of substance dependence and yet has severe panic  attacks.   The indications, alternatives, and adverse effects of the following were  discussed with the patient:  Ambien for anti insomnia, Atacand for anti  acute anxiety including the risk of dependence and driving while drowsy.  The patient understands the above information and would like to proceed  with judicious use of Ativan and Ambien in the context of an alcohol and  drug rehabilitation program and abstinence regimen.   RECOMMENDATIONS:  The undersigned has asked the care manager to set this  patient up with a CD IOP, chemical dependency intensive outpatient  program, at one of the local hospitals either [facility], [facility] or [facility].  There are other programs available for the  patient's choice as well.   The undersigned did recommend an inpatient chemical dependency  residential program;  however, the patient declined and she is not  committable.   Regarding treating panic attacks with benzodiazepines in a patient with  polysubstance dependence, it is recommended that the patient have one  outpatient Nghia Mcentee of the prescriptions and that they be given in a  limited quantity and that appointments be arranged more frequent than  average to facilitate judicious use and to confirm that the patient is  maintaining her abstinence regimen.   RECOMMENDATIONS:  12-step groups with the goal of maintaining abstinence  and sobriety.   If Ativan is continued for panic, would utilize 0.5 to 1 mg p.r.n. panic  dispensing no more than 12 mg at a time.   Would utilize Ambien 5-10 mg q.h.s. p.r.n. insomnia dispensing a  relatively small amount per prescription.   Regarding the patient's psychiatric follow-up, once she is done with a  CD IOP would have her enter cognitive behavioral therapy with a  psychotherapist with progressive muscle relaxation and deep breathing  training.  The goal of this would be to eliminate panic and the need for  Ativan.      Antonietta Breach, M.D.  Electronically Signed     JW/MEDQ  D:  07/09/2007  T:  07/10/2007  Job:  [id number]   cc:   Cherylann Parr D team

## 2011-05-10 NOTE — H&P (Signed)
Erin [name], Erin [name]             ACCOUNT NO.:  [id number]   MEDICAL RECORD NO.:  [id number]          PATIENT TYPE:  INP   LOCATION:  3729                         FACILITY:  [facility]   PHYSICIAN:  Lonia Blood, M.D.      DATE OF BIRTH:  [birth year]   DATE OF ADMISSION:  07/05/2007  DATE OF DISCHARGE:                              HISTORY & PHYSICAL   PRIMARY CARE PHYSICIAN:  The patient is unassigned.   CHIEF COMPLAINT:  Vomiting blood and bright red blood per rectum.   HISTORY OF PRESENT ILLNESS:  The patient is a 45 year old Caucasian  female with multiple psychiatric problems and also known hepatic  cirrhosis who has continued to abuse both alcohol, cocaine, etc.  She  has no physicians and according to the patient, has been off her  medications including her psychiatric medications.  She has no insurance  and no money to go see a doctor and she has not been taking any  medications.  She came in today secondary to over two weeks of passing  blood per rectum, which she describes as bright red and also in the past  two days, she has been vomiting blood.  Associated with this is some  abdominal pain.  Denies any fever, no weakness, no dizziness, no double  vision.   PAST MEDICAL HISTORY:  Her past medical history is significant for:  1. Liver cirrhosis.  2. Polysubstance abuse including cocaine, alcohol, THC, benzos,      bipolar disorder, schizophrenia.  3. Morbid obesity.   ALLERGIES:  SHE IS ALLERGIC TO MORPHINE AND PROZAC.   MEDICATIONS:  Medications currently none.  The patient quit medications  weeks ago.   SOCIAL HISTORY:  The patient has been noncompliant.  She smokes about  three cigarettes a day.  Drinks frequently.  Also abuses cocaine, THC,  benzos, etc.   FAMILY HISTORY:  Denies family history of liver disease.   REVIEW OF SYSTEMS:  A 12-point review of systems is negative except for  HPI.   PHYSICAL EXAMINATION:  VITAL SIGNS:  Temperature of 97.3.  Blood  pressure 117/78.  Pulse of 99.  Respiratory rate 22.  Sats 97% on room  air.  GENERAL:  The patient is morbidly obese and in no acute distress.  HEENT:  PERRLA.  EOMI.  No pallor, no jaundice.  NECK:  Supple, no JVD, no lymphadenopathy.  RESPIRATORY:  She has good air entry bilaterally.  No wheezes, no rales.  CARDIOVASCULAR SYSTEM:  She is mildly tachycardic.  ABDOMEN:  Distended, obese, soft, nontender, mild shifting dullness.  No  stool in the rectum.  Some blood stain.  EXTREMITIES:  With 1+ edema.   LABORATORY DATA:  White count is 1.9, hemoglobin 10.9 and platelet count  74,000.  Creatinine is 0.6, sodium 135, potassium 4.0, chloride 101, BUN  less than 3, glucose 117.  Urinalysis showed cloudy orange urine with  trace hemoglobin, moderate bilirubin, some ketones, urobilinogens,  nitrite positive, leukocyte esterase small, WBC 3 to 6, RBC 3 to 6 and  many bacteria.  Albumin is 2.5, AST is 17, total bilirubin 3.8.  ASSESSMENT:  Therefore, this is a 45 year old female who is noncompliant  with taking medications, with polysubstance abuse and advanced  cirrhosis.  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  The patient also is positive for cocaine, THC and  benzodiazepines on her urine drug screen and patient wants to be  detoxed.   PLAN:  The plan therefore will be:  1. GI bleed with hematemesis and bright red blood per rectum and drop      in hemoglobin from a week ago to today of over 3 grams:  We will      admit the patient to a monitored bed.  We will put two wide-bore      IVs, type and cross-match for two units of packed red blood cells.      Serial CBCs, check TSH and get a GI consult.  We will put her on      proton pump inhibitor and keep her NPO for now.  She will benefit      from both EGD and colonoscopy, it looks like.  2. Liver cirrhosis:  This appears to be  advanced and end-stage.  We      will continue conservative measures.  We will check an ammonia      level and observe patient.  If there is any sign of altered mental      status, we will start her on some lactulose.  3. History of hypothyroidism:  We will check a TSH and if abnormal, we      will follow with other thyroid tests.  4. Bipolar disorder/schizophrenia:  The patient is off her medications      and she would benefit from a psychiatric consult this      hospitalization.  5. Polysubstance abuse:  The patient has received some counseling.  I      would give her further counseling as an inpatient.  In the      meantime, we will watch for delirium tremens.  Her alcohol level is      less than 5 even though she states she drank alcohol today.  We      will watch out for withdrawal symptoms and use Ativan p.r.n. for      now.  6. UTI:  We will start the patient on some Cipro empirically for UTI      while awaiting her cultures.  Further treatment will depend on how      the patient responds in the hospital.      Lonia Blood, M.D.  Electronically Signed     LG/MEDQ  D:  07/06/2007  T:  07/06/2007  Job:  [id number]

## 2011-05-10 NOTE — Discharge Summary (Signed)
NAME:  Erin [name], Erin [name]             ACCOUNT NO.:  [id number]   MEDICAL RECORD NO.:  [id number]          PATIENT TYPE:  INP   LOCATION:  1534                         [facility]ACILITY[facility]  [facility]   PHYSICIAN:  Thomas A. Cornett, M.D.DATE OF BIRTH:  [birth year]   DATE OF ADMISSION:  03/12/2009  DATE OF DISCHARGE:  03/17/2009                               DISCHARGE SUMMARY   ADMISSION DIAGNOSES:  Ventral hernia.   DISCHARGE DIAGNOSES:  Ventral hernia.   PROCEDURES PERFORMED:  Repair of ventral hernia with mesh.   BRIEF HISTORY:  The patient is a 45 year old female with multiple  medical problems to include cirrhosis and chronic pain, depression, who  was admitted due to a ventral hernia.  She had a previous laparoscopic  cholecystectomy done elsewhere and developed an epigastric hernia from  that.  She presented for repair.   HOSPITAL COURSE:  The patient's hospital course was somewhat prolonged  due to constipation and slow return of bowel function.  Her wound was  clean, dry and intact.  We were able to advance her diet slowly and then  with multiple cathartics, able to get her bowels to move by postop day  four.  She was tolerating her diet, had adequate pain control at this  point in time, was discharged home postoperative day five once her bowel  function returned.   DISCHARGE INSTRUCTIONS:  She will return to see me in my office in 3-4  weeks.  She has been given a prescription for oxycodone 10 mg every 4  hours as needed for pain.  She also has a prescription for Phenergan  12.5 mg tablets one every 6 hours as needed for nausea.  She will resume  her home medications as is outlined in the medication reconciliation  list in the chart.   DISCHARGE CONDITION:  Improved.      Thomas A. Cornett, M.D.  Electronically Signed     TAC/MEDQ  D:  03/17/2009  T:  03/17/2009  Job:  [id number]

## 2011-05-10 NOTE — Assessment & Plan Note (Signed)
[facility]                         GASTROENTEROLOGY OFFICE NOTE   NAME:[name] [name] BARNICK                    MRN:          [id number]  DATE:08/01/2007                            DOB:          August 28, 1966    CHIEF COMPLAINT:  Rectal bleeding.   [name] [name] is still having bleeding.  She has been in the hospital twice  with rectal bleeding, thought to be secondary to hemorrhoids.  I have  seen her at least once.  She had some bleeding.  She had an EGD by Dr.  Elnoria Howard.  I have not yet seen that report in our files.  I will request a  copy of that.  She had some erosions in the stomach, but no varices.  She clearly had hemorrhoids on exam at one point.  She says it is  painful in her rectum as well.  She is using some Anucort suppositories.  She has been seen back at [facility].  Substance abuse has remained a  problem.  Her schizophrenia and bipolar disorder have been a problem as  well.  She has not had any significant change in her hemoglobin since  she has been hospitalized.  She says she has been placed on Reglan, and  that is making her twitch some, and not feel right.   PAST MEDICAL HISTORY:  Reviewed and unchanged from previously, but  includes hypertension, prior cholecystectomy, schizophrenia,  polysubstance abuse, bipolar disorder, alcoholism as mentioned above.  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  Gastroesophageal  reflux disease.  External hemorrhoids noted on colonoscopy January 11, 2005.  She has had an eating disorder.  She has cirrhosis with  pancytopenia.   MEDICATIONS:  1. Listed and reviewed in the chart, but at this time include Protonix      40 mg daily.  2. Lorazepam 1 mg at bedtime.  3. Anucort suppositories 25 mg b.i.d.  4. Lactulose 1 tablespoon b.i.d.  5. Vitamin B1 100 mg b.i.d.  6. Folic acid daily.  7. Reglan 10 mg twice daily.  8. Promethazine 25 mg p.r.n.   ALLERGIES:  Reported to MORPHINE and  PROZAC.   PHYSICAL EXAM:  Shows an obese, heavily tattooed white woman.  She is in  no acute distress.  The eyes look anicteric.  ABDOMEN:  Obesity plus/minus possible ascites.  RECTAL:  In the presence of female nursing staff, demonstrates a tender  hemorrhoid in the canal.  There is no clear-cut thrombosis.  There is no  mass.  The stool is brown.  LOWER EXTREMITIES:  Show 1+ edema.  She does have hepatomegaly extending  into the midline and about 4 finger-breadths down.  The left lobe of the  liver actually crosses the midline.   ASSESSMENT:  This is a complicated situation.  1. Her rectal bleeding, I think, is hemorrhoids.  2. She has cirrhosis and ascites and advanced liver disease in the      setting of polysubstance abuse and significant psychiatric      disturbance.  She is fluid-overloaded.   PLAN:  1. Anamantle kit is prescribed for the patient.  It  has some lidocaine      and it may help the pain.  2. I have elected not to do any fluid management at this time to try      not to add too many medications at one time.  3. I will have her return in 1 month.  We did not schedule that today,      but we will have her do that.  4. Further plans pending clinical course.  She will stop the [facility].      I think she is intolerant of that, and that is listed as an      intolerance on her allergy list at this time.   LABS:  Hemoglobin 10.9, which is stable.  Platelet count is 121,000.  Her white count is low-normal at 4.3.  Normal renal function.  Bilirubin  was 2.6.   NOTE:  This is going to be extremely difficult in that her polysubstance  abuse and her psychiatric issues are going to make compliance a problem,  I think.  I would not be surprised at some point if this lady did not  become institutionalized in order to help her get care, but we will see  what happens.     Iva Boop, MD,FACG  Electronically Signed    CEG/MedQ  DD: 08/02/2007  DT: 08/02/2007  Job #:  [id number]   cc:   Melvern Banker

## 2011-05-10 NOTE — H&P (Signed)
Erin [name], Erin [name]             ACCOUNT NO.:  [id number]   MEDICAL RECORD NO.:  [id number]          PATIENT TYPE:  INP   LOCATION:  5125                         FACILITY:  [facility]   PHYSICIAN:  Hillery Aldo, M.D.   DATE OF BIRTH:  [birth year]   DATE OF ADMISSION:  07/17/2007  DATE OF DISCHARGE:                              HISTORY & PHYSICAL   PRIMARY CARE PHYSICIAN:  None.   CHIEF COMPLAINT:  Recurrent rectal bleeding, request detoxification.   HISTORY OF PRESENT ILLNESS:  The patient is an 45 year old,  polysubstance abuser who was hospitalized from July 05, 2007 through  July 10, 2007 for evaluation and treatment of a GI bleed.  She also was  noted to have polysubstance abuse.  To address her GI bleed, she  underwent upper endoscopy which showed esophageal erosions and  superficial gastric ulcers.  She had had a prior colonoscopy recently  which only showed some hemorrhoids.  Her discharge hemoglobin was 9.2.  She was supposed to follow-up with an intensive outpatient chemical  dependency program and states that she was not ready for this.  She  now presents to the hospital with complaints of rectal bleeding and  request for detoxification.   PAST MEDICAL HISTORY:  1. Cirrhosis of the liver.  2. Polysubstance abuse including cocaine, alcohol, marijuana, and      benzodiazepines.  3. History of bipolar disorder and schizophrenia.  4. Obesity.  5. Recent esophageal erosions/esophagitis.  6. Peptic ulcer disease.  7. Iron deficiency anemia likely secondary to chronic blood loss and      pancytopenia from bone marrow suppression from alcohol abuse.  8. Status post cholecystectomy.  9. Status post tonsillectomy.  10.History of seizure and DTs in the past with alcohol withdrawal.   FAMILY HISTORY:  There is no family history of liver disease.  Otherwise  noncontributory to this admission.   SOCIAL HISTORY:  The patient is single.  She is noncompliant with her  medication  regimen.  She smokes about a quarter of a pack of cigarettes  daily.  She abuses alcohol, cocaine, marijuana, and benzodiazepines.   ALLERGIES:  MORPHINE AND PROZAC.   CURRENT MEDICATIONS:  1. Protonix 40 mg b.i.d.  2. Folic acid 1 mg daily.  3. Thiamine 100 mg daily.  4. Lactulose 50 mg b.i.d.  5. Ativan 0.5 mg q.8 h. p.r.n.  6. Ambien 5 mg every night p.r.n.   REVIEW OF SYSTEMS:  The patient does endorse some abdominal pain.  The  patient had three bloody stools since her discharge and describes it as  a fair amount.  She has some dyspnea on exertion.  No chest pain or  cough.  Otherwise negative.   Temperature 98.9, pulse 101, respirations 20, blood pressure 105/66, O2  saturation 97% on room air.  GENERAL:  Obese female in no acute distress.  HEENT:  Normocephalic, atraumatic.  PERRL.  EOMI.  Oropharynx reveals  dry mucous membranes.  NECK:  Supple, no thyromegaly, no lymphadenopathy, no jugular venous  distension.  CHEST:  Lungs clear to auscultation bilaterally with good air movement.  HEART:  Tachycardiac rate,  regular rhythm.  No murmurs, rubs or gallops.  ABDOMEN:  Soft, nontender, nondistended with normal active bowel sounds.  RECTAL EXAM:  The patient does have prominent hemorrhoids.  No stool in  the rectum, but the effluent was heme negative.  EXTREMITIES:  No clubbing, edema, cyanosis.  SKIN:  Warm and dry.  The patient has multiple dilated capillaries on  the surface of her skin consistent with liver disease.  NEUROLOGIC:  Alert and oriented x3.  Nonfocal.   DATA REVIEW:  White blood cell count was 2.6, hemoglobin 9.7, hematocrit  29.2, platelets 169 with an MCV of 87.6.  Sodium is 141, potassium 3.6,  chloride 107, bicarb 18.8, BUN less than 3.  Glucose was 127.  Tricyclic  screen was negative.  Urine drug screen was positive for  benzodiazepines, cocaine, THC and alcohol level of 96.   ASSESSMENT/PLAN:  1. Polysubstance abuse:  The patient will need  inpatient      detoxification and treatment for her significant polysubstance      abuse, preferably on a dual diagnosis unit given her history of      schizophrenia and bipolar disorder.  We will put her on Ativan      p.r.n. to help prevent DTs and seizures.  She will need a      psychiatry referral in the morning for consideration as to an      inpatient dual diagnosis treatment program.  2. Rectal bleeding:  Doubt this is significant and likely due to      hemorrhoids given the fact that it is described as bright red.  Her      hemoglobin is actually higher than her discharge hemoglobin.  Her      rectal exam was unremarkable.  Would continue her on Protonix and      use Anusol-HC p.r.n. for any further rectal bleeding.  3. Prophylaxis:  Initiate GI prophylaxis with Protonix and DVT      prophylaxis with PAS hoses.      Hillery Aldo, M.D.  Electronically Signed     CR/MEDQ  D:  07/18/2007  T:  07/18/2007  Job:  [id number]
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
## 2011-05-10 NOTE — Op Note (Signed)
Erin [name], ROMO             ACCOUNT NO.:  [id number]   MEDICAL RECORD NO.:  [id number]          PATIENT TYPE:  INP   LOCATION:  0007                         FACILITY:  [facility]   PHYSICIAN:  Thomas A. Cornett, M.D.DATE OF BIRTH:  [birth year]   DATE OF PROCEDURE:  03/12/2009  DATE OF DISCHARGE:                               OPERATIVE REPORT   PREOPERATIVE DIAGNOSIS:  Ventral hernia.   POSTOPERATIVE DIAGNOSIS:  A 2-cm x 2-cm epigastric ventral hernia.   PROCEDURE:  Repair of epigastric ventral hernia measuring 2 x 2 cm with  Prolene hernia system mesh.   SURGEON:  Harriette Bouillon, M.D.   ASSISTANT:  Manus Rudd, M.D.   ANESTHESIA:  General endotracheal anesthesia, 0.25% Sensorcaine local.   ESTIMATED BLOOD LOSS:  5 mL.   DRAINS:  None.   INDICATIONS FOR PROCEDURE:  The patient is a 45 year old female who had  a previous laparoscopic cholecystectomy done elsewhere.  She has  developed an epigastric hernia from one of her port sites and presents  today for repair of this.   DESCRIPTION OF PROCEDURE:  The patient was brought to the operating room  and placed supine.  After induction of general anesthesia, the abdomen  was prepped and draped in sterile fashion.  The upper abdomen was  examined, and incision was made in the epigastrium in the midline.  Dissection was carried down.  The hernia sac was identified and  dissected away from the fascial edges in the midline.  Once we got down  to the actual fascial opening, it was only about 2 cm in maximal  diameter.  We reduced all contents of the hernia sac which was  preperitoneal fat back in the preperitoneal space without difficulty.  At this point in time, I put my finger in and felt that we were at the  preperitoneal space.  A large Prolene hernia system was used with the  inner leaflet placed within the fascia at the [place] placed on top of the  fascia.  I then secured the mesh to the fascia with #1 Novofil's.  I  trimmed  the mesh down so there was no excess mesh of note.  At this  point in time, we irrigated and closed the fat  layer with a 2-0 Vicryl running suture.  Three-0 Monocryl was used to  close skin incision.  All final counts of sponge, needle and instruments  were found to be correct at this portion of the case.  The patient was  then awoke, taken to recovery in satisfactory condition.      Thomas A. Cornett, M.D.  Electronically Signed     TAC/MEDQ  D:  03/12/2009  T:  03/12/2009  Job:  [id number]   cc:   Melvern Banker  Fax: [phone]

## 2011-05-10 NOTE — Op Note (Signed)
NAME:  Erin [name], Erin [name]             ACCOUNT NO.:  [id number]   MEDICAL RECORD NO.:  [id number]          PATIENT TYPE:  AMB   LOCATION:  DSC                          FACILITY:  [facility]   PHYSICIAN:  Rodney A. Mortenson, M.D.DATE OF BIRTH:  [birth year]   DATE OF PROCEDURE:  07/16/2009  DATE OF DISCHARGE:                               OPERATIVE REPORT   JUSTIFICATION:  A 45 year old female who has had fracture of the distal  ankle including the medial malleolus and the fibula treated with open  reduction and internal fixation in the past.  She now has painful screws  on the medial aspect of her right ankle and the distal end of the  capture plate.  Two of the screws have backed out somewhat, these were  locally quite tender.  She is now admitted to have removal of the loose  screws.  Complications were addressed preoperatively.  No guarantee can  be given as to the outcome.  Questions were answered and encouraged  extensively.  Again, in the preoperative room, the patient palpated with  the painful screws.  These were marked with marking pens.  This  coincided with the painful sites preoperatively in the office.   JUSTIFICATION FOR OUTPATIENT SURGERY:  Minimal morbidity.   PREOPERATIVE DIAGNOSIS:  Painful screws, medial malleolus status post  open reduction and internal fixation fracture, right ankle.   POSTOPERATIVE DIAGNOSIS:  Painful screws, medial malleolus status post  open reduction and internal fixation fracture, right ankle.   PROCEDURE:  Removal of 3 loose screws and distal capture plate, medial  malleolus, right ankle.   SURGEON:  Lenard Galloway. Mortenson, MD   ANESTHESIA:  General.   PROCEDURE IN DETAIL:  The patient was placed on the operating table in a  supine position with pneumatic tourniquet about the thigh.  The right  lower extremity was prepped with DuraPrep and draped out in the usual  manner.  The leg was wrapped out in Esmarch and tourniquet was elevated.  The  C-arm was used throughout.  C-arm was used initially to localize the  painful screws.  These were also marked with the pen and coincided with  areas the patient palpated as the painful sites.  Incision was made  starting from the medial malleolus and carried proximally for about 2-3  inches.  Skin edges were retracted.  The vein was encountered and this  was tied off with Ethibond suture, 2-0.  Self-retaining retractors were  put in place.  The plate had bony ingrowth throughout but two of the  screws were quite loose and backing out through the screw in this area  and I felt that it should be removed at the same time.  There was  fibrous tissue over each of the screws, this was removed.  Appropriate  sized screwdrivers were then used to remove all 3 screws.  Again, the  plate was stable and had bony overgrowth and was not loose, only the  screws were loose.  Vicryl was used to close the subcutaneous tissue and  stainless steel staples were used to close the skin.  Sterile bulky  dressing was applied and the patient was then returned to the recovery  room in excellent condition.  Technically, this went extremely well.   DISPOSITION:  1. Percocet for pain.  2. Usual postop instructions.  3. She may weightbear with crutches for comfort.  4. Return to my office on Wednesday of next week.      Rodney A. Chaney Malling, M.D.  Electronically Signed     RAM/MEDQ  D:  07/16/2009  T:  07/17/2009  Job:  [id number]

## 2011-05-13 NOTE — Consult Note (Signed)
NAMEHENDEL, Erin [name]             ACCOUNT NO.:  [id number]   MEDICAL RECORD NO.:  [id number]          PATIENT TYPE:  INP   LOCATION:  5039                         FACILITY:  [facility]   PHYSICIAN:  Leighton Roach McDiarmid, M.D.DATE OF BIRTH:  Feb 23, 1966   DATE OF CONSULTATION:  02/14/2005  DATE OF DISCHARGE:                                   CONSULTATION   REQUESTING PHYSICIAN:  Katy Fitch. Sypher, M.D.   HISTORY OF PRESENT ILLNESS:  This is a 45 year old white female with  cirrhosis of the liver, portal hypertension, and portal gastropathy who  underwent removal of a fractured titanium radius plate and screw on February  18.  Primary practice teaching service was consulted because of elevated  TSH.  Presently, patient complains of constipation with a last bowel  movement two days ago.  She did have nausea and vomiting last night which  resolved with IV Reglan.   PAST MEDICAL HISTORY:  1.  Liver cirrhosis.  2.  Esophageal varices.  3.  Portal hypertension and portal gastropathy.  4.  Alcohol abuse.  5.  Tobacco abuse.   MEDICATIONS:  1.  Aldactone 25 mg p.o. t.i.d.  2.  Lactulose 30 mg p.o. t.i.d.  3.  Multivitamins daily.  4.  Propranolol 10 mg p.o. b.i.d.  5.  Protonix 40 mg p.o. daily.  6.  Risperdal 0.5 mg daily.  7.  Celexa 20 mg one and a half tablets p.o. daily.  8.  Thiamin.  9.  Multivitamins and folic acid.  10. Oxycodone 5 mg p.o. one to two tablets p.r.n. for pain.   ALLERGIES:  No known drug allergies.   SURGERIES AND PROCEDURES:  1.  EGD and colonoscopy in January 2006 which showed portal hypertension and      portal gastropathy.  2.  Open reduction internal fixation of an open fracture of the right arm in      June 2003.   FAMILY HISTORY:  Noncontributory.   SOCIAL HISTORY:  Patient has history of alcohol abuse, but has stopped  drinking since September 2005.   PHYSICAL EXAMINATION:  VITAL SIGNS:  Blood pressure 120/80, heart rate 88,  respiratory rate 20,  temperature 99, O2 saturation 99% on room air.  GENERAL:  This is a an obese white female not in distress who is alert,  communicative with good insight.  HEENT:  Anicteric sclerae.  Pupils are equal, round, and reactive to light  bilaterally.  No tonsillar pharyngeal congestion with fair oral dentition.  NECK:  No thyromegaly.  No cervical lymphadenopathy.  CVS:  Adynamic precordium.  Regular rate and rhythm.  Normal S1 and S2.  No  murmurs.  RESPIRATORY:  Clear to auscultation bilaterally.  No crackles.  No wheezes.  ABDOMEN:  There is note of spider angiomata.  Normal bowel sounds.  The  liver was enlarged measuring about 18 cm below the right costal margin  crossing the midline and was noted to be smooth and nontender.  Spleen was  palpable measuring about 5 cm, smooth, and nontender.  No fluid wave.  No  tenderness.  EXTREMITIES:  No edema.  No palmar erythema.  NEUROLOGIC:  Cranial nerves II-XII intact.  Motor/strength 5/5 in the left  upper extremity and bilateral lower extremities.  Right upper extremity was  not assessed.  No asterixis.  Reflexes 2+ bilaterally.   LABORATORIES:  Hemoglobin 11.3, hematocrit 32.7, WBC 5.1, platelet 130.  TSH  8.981, T3 183.9, T4 9.2.   ASSESSMENT:  A 45 year old white female status post removal of fractured  titanium radius plate.  1.  Status post removal of fractured titanium plate per orthopedics on      vancomycin for possible plate infection.  2.  Subclinical hypothyroidism.  TSH was elevated, but with normal T4.  At      the present no indication for thyroid supplementation.  Will monitor      thyroid function tests as an outpatient.  3.  Liver cirrhosis with portal hypertension and portal gastropathy.      Continue spironolactone, propranolol.  Will increase lactulose to      achieve good bowel movements.  Patient is presently constipated and      needs good bowel regimen to prevent hepatic encephalopathy.  4.  Disposition per  orthopedics.  Will arrange followup with Dr. Deirdre Priest      on discharge.      MC/MEDQ  D:  02/14/2005  T:  02/14/2005  Job:  [id number]   cc:   Pearlean Brownie, M.D.  Fax: [phone]
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
## 2011-05-13 NOTE — Op Note (Signed)
NAME:  Erin [name], Erin [name]                       ACCOUNT NO.:  [id number]   MEDICAL RECORD NO.:  [id number]                   PATIENT TYPE:  OIB   LOCATION:  2899                                 FACILITY:  [facility]   PHYSICIAN:  Kathy Breach, M.D.                   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   OPERATIVE PROCEDURE:  Micro-direct laryngoscopy.   POSTOPERATIVE DIAGNOSIS:  Polyp no longer present.  Patient gave no history  of having coughed it out, but apparently that would be what had happened.   DESCRIPTION OF PROCEDURE:  With the patient under general orotracheal  anesthesia, the laryngoscope was introduced.  Both valleculae were clear,  the epiglottis was normal on both lingual and glottic surfaces.  Both  piriform sinuses were clear.  Interior larynx was visualized and vocal cords  immediately visualized.  Both were moderately erythematous from chronic  laryngitis but no vocal cord polyp was visible, as documented on  photographic examination in the office preoperatively.  The orotracheal tube  with the cuff inflated was repeatedly withdrawn up through the cords and no  polyp delivered from the subglottic area.  The scope was then introduced to  elevate the orotracheal tube and carefully inspect the intra-arytenoid area  and again completely clear subglottically and no polyp present.  It was  noted preoperatively the patient's voice was improved from her office visit  the day preceding, and she was not having the respiratory difficulty that  she had been noted to have the previous day as well.  It is to be assumed  that she apparently sometime yesterday to noon today presenting to the  hospital, probably spontaneously coughed and projected the polyp  mass  itself.  Careful inspection of the right cord showed no obvious area of its  origin, although there was some area of erythema possibly on the false cord  area that may have been the area of the stalk.  Otherwise, suspect the  attachment and stalk was inferior laryngeal surface or subglottic.  A photo  was obtained for documentation purposes.  The patient tolerated the  procedure well and was taken to the recovery room in stable general  condition.                                               Kathy Breach, M.D.    Erin [name]  D:  04/30/2004  T:  05/01/2004  Job:  [id number]
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
## 2011-05-13 NOTE — Discharge Summary (Signed)
[facility]  Patient:    Erin [name], Erin [name] Visit Number: [id number] MRN: [id number]          Service Type: EMS Location: [facility] Attending Physician:  Hanley Seamen Dictated by:   Jeanice Lim, M.D. Admit Date:  01/06/2002 Discharge Date: 01/06/2002                             Discharge Summary  IDENTIFYING DATA:  The patient is a 45 year old Caucasian female voluntarily admitted after presenting at the emergency room last night requesting voluntary placement for treatment of alcohol abuse and her mental illness. She had a two-month history of being on a drinking binge, unable to specify the amount, has a history of bipolar illness and schizophrenia and reports some memory loss, suicidal ideation.  PAST PSYCHIATRIC TREATMENT:  The patient has been hospitalized multiple times at [facility].  The patient has been on multiple previous medications (unable to describe which ones).  ALLERGIES:  Reports an allergy to PROZAC.  ADMITTING DIAGNOSES: Axis I:     1. Bipolar disorder mixed phase.             2. Alcohol dependence.             3. Polysubstance abuse including cocaine, marijuana, alcohol. Axis II:    None. Axis III:   1. Status post fracture of right ankle.             2. Thrombocytopenia, not otherwise specified.             3. Liver disorder, not otherwise specified. Axis IV:    Moderate problems with housing and medical problems. Axis V:     32/55.  MENTAL STATUS EXAMINATION:  This was a limping, large-built female appearing approximately 10 years older than her stated age having a constricted tearful affect, polite and cooperative.  Speech within normal limits, spontaneous rambling at times.  Mood depressed and helpless.  Thought process goal directed, positive for suicidal ideation, promising safety on the unit, denying auditory-visual hallucinations, cognitively intact, judgment and insight poor with a poor impulse  control.  HOSPITAL COURSE:  The patient was ordered routine p.r.n. medications and given phenobarbital detox protocol for safe withdrawal, Vicodin p.r.n. pain, and CBGs were checked.  The patient was restarted on lithium and Seroquel and Neurontin.  A PPD was placed and lithium level monitored.  Seroquel was optimized as well as Neurontin and the patient was scheduled Vicodin decreasing it to t.i.d.  The patient required treatment for constipation.  The patient requested to be started on Topamax due to history of having lost weight with Topamax and did lose 3 pounds after Topamax was started.  Case manager assisted the patient with setting up substance abuse treatment followup as well as assisted living.  The patient reported motivation to be compliant with followup plan as well as be abstinent.  CONDITION ON DISCHARGE:  Her condition on discharge was markedly improved. Her mood was more stable with no mood swings, agitation or lability previously seen, affect brighter, thought processes goal directed, thought content negative for psychotic symptoms or dangerous ideation, cognition intact and judgment and insight improved, reporting motivation to take medications as directed including Cytomel for her hyperthyroidism.  DISCHARGE MEDICATIONS: 1. Lithium 300 mg one and one half in the morning, one at 3 p.m., and one and    one half at q.h.s. 2. Seroquel 100 mg one half t.i.d. and  three q.h.s. 3. Topamax 25 two q.a.m. and two q.h.s. 4. Neurontin 400 mg two q.i.d. 5. Vicodin 5/500 t.i.d. given a two-week prescription.  FOLLOWUP:  The patient was to follow up at the fracture clinic at [facility] on December 28, 2001 at 9:30 a.m. at [facility] on December 31, 2001 at 9 a.m., attend AA and NA and go to the [facility] for residential stay.  DISCHARGE DIAGNOSES: Axis I:     1. Bipolar disorder mixed phase.             2. Alcohol  dependence.             3. Polysubstance abuse including cocaine, marijuana, alcohol. Axis II:    None. Axis III:   1. Status post fracture of right ankle.             2. Thrombocytopenia, not otherwise specified.             3. Liver disorder, not otherwise specified. Axis IV:    Moderate problems with housing and medical problems. Axis V:     Global Assessment of Functioning on discharge 50. Dictated by:   Jeanice Lim, M.D. Attending Physician:  Hanley Seamen DD:  02/06/02 TD:  02/07/02 Job: [id number]
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
## 2011-05-13 NOTE — Discharge Summary (Signed)
NAME:  Erin [name], Erin [name]                       ACCOUNT NO.:  [id number]   MEDICAL RECORD NO.:  [id number]                   PATIENT TYPE:  INP   LOCATION:  5739                                 FACILITY:  [facility]   PHYSICIAN:  Hollice Espy, M.D.            DATE OF BIRTH:  [birth year]   DATE OF ADMISSION:  08/26/2004  DATE OF DISCHARGE:  08/31/2004                                 DISCHARGE SUMMARY   CONSULTANTS:  1.  Dr. Jeanie Sewer, psychiatry.  2.  Dr. Luther Parody, [facility].   DISCHARGE DIAGNOSES:  1.  Alcoholic cirrhosis with ascites.  2.  Secondary coagulopathy.  3.  Secondary thrombocytopenia.  4.  Low-grade resting tremor secondary to liver disease.  5.  History of alcohol abuse.  6.  History of polysubstance abuse including cocaine and marijuana.  7.  History of bipolar disorder.  8.  Versus major depressive disorder.   DISCHARGE MEDICATIONS:  1.  Multivitamin daily.  2.  Thiamine 100 mg daily.  3.  Folic acid 1 mg daily.  4.  Protonix 40 mg daily.  5.  Lactulose 30 mL three times a day.  6.  Aldactone 25 mg two times a day.  7.  Propranolol 10 mg two times a day.   HISTORY OF PRESENT ILLNESS:  The patient is a 45 year old white female with  a long history of heavy alcohol abuse who presented on August 26, 2004  with complaints of bruising on her abdomen.  She had been noted in the past  to have a history of noncompliance, alcohol abuse, and bipolar disorder.  Based on previous discharge summary the patient was noted to have been on a  number of medications none of which she had been taking.  The patient's lab  work on evaluation she was noted to have some elevation in her PT/PTT as  well as had evidence of ascites.  Her total bilirubin was up to 8.  We  expected that likely the patient had cirrhosis with possible end-stage liver  disease.  The patient was started on Lasix and a 2 g sodium diet.  Ammonia  level was checked which showed a slightly elevated level in  the 60s and she  was started on lactulose as well.  A urine drug screen was checked and she  was positive for marijuana.  The patient also admitted to smoking cocaine in  the past few weeks prior.   HOSPITAL COURSE:  Problem 1.  LIVER DISEASE AND SECONDARY PROBLEMS:  She was  started on an Ativan protocol drip to prevent withdrawal.  Dr. Luther Parody  from [facility] was consulted and felt that she had likely resolving alcoholic  hepatitis with underlying cirrhosis likely.  There was little to be done  medically other than advise the patient to stop drinking.  She would need  heavy counseling.  The patient's lab work was followed on a daily basis and  she was felt  to remain relatively stable.  She finished her Ativan protocol  on the evening of August 30, 2004.  She is being discharged on thiamine,  folate, and a multivitamin.   Problem 2.  BIPOLAR DISORDER:  Dr. Jeanie Sewer from psychiatry evaluated the  patient on August 28, 2004.  He felt that the patient had a major  depressive disorder severe versus bipolar disorder with depressed features  as well as alcohol dependence with physiologic dependence as well and  cocaine abuse.  He recommended, which the patient concurred with initially,  for admission to a psychiatry unit for dual diagnosis treatment.  In the  meantime, the patient was to be continued on her Ativan withdrawal protocol.  Other psychotropics were deferred initially in follow up for facilitation of  transfer to [facility] at [facility] the patient was  considering.  However, by August 29, 2004 the patient became more  ambivalent to inpatient psychiatric treatment and she wanted to make sure  she would rather go home first to take care of some business.  She said she  would go into the facility on her own at [facility] and, given the  fact that she was not committable, the plan would then be to continue Ativan  protocol and if the patient remained  adamant about not being transferred  directly from [facility] to [facility], then  recommending [facility] Drug [facility] follow up as well as Alcoholics  Anonymous numbers for the patient to follow up with as well as a number to  advise her to at least try on her own to go to [facility].   Problem 3.  INTENTION RESTING TREMORS FELT TO BE SECONDARY TO HER ALCOHOLIC  DISEASE:  She was started on propranolol which she tolerated well and her  tremors had subsided.  She was felt to be alert and oriented and stable to  go home although again she is quite advised to stop drinking and to get  help.  She is told that if she just continued to drink likely she will die  in the next 3 to 6 months which she does understand.   Phone numbers for [facility] have been provided as well as Alcohol [facility]  Drug Services and Alcoholics Anonymous.  Prescriptions for the patient's  home medications have been provided.  She is being discharged to home.   DIET:  A 2 g low sodium diet.   ACTIVITY:  Will be as tolerated and she is obviously advised not to drink or  do any drugs.   DISPOSITION:  Improved from admission.                                                Hollice Espy, M.D.    SKK/MEDQ  D:  08/31/2004  T:  08/31/2004  Job:  [id number]   cc:   Althea Grimmer. Luther Parody, M.D.  [address]  [place]  [place] [address]  Fax: [phone]   Antonietta Breach, M.D.
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
## 2011-05-13 NOTE — H&P (Signed)
NAME:  Erin [name], Erin [name]                       ACCOUNT NO.:  [id number]   MEDICAL RECORD NO.:  [id number]                   PATIENT TYPE:  INP   LOCATION:  5705                                 FACILITY:  [facility]   PHYSICIAN:  Hollice Espy, M.D.            DATE OF BIRTH:  05-Apr-1966   DATE OF PROCEDURE:  DATE OF DISCHARGE:                                HISTORY & PHYSICAL   CHIEF COMPLAINT:  Easy bruising.   HISTORY OF PRESENT ILLNESS:  The patient is a 45 year old white female with  past medical history of heavy alcohol abuse, bipolar disorder, polysubstance  abuse and depression who presents to the emergency room after she noted that  she was starting to have some bruising areas on her abdomen, appropriate a  quarter-size.  Total number was two.  She became concerned and came to the  emergency room.  At that time the emergency room physician noted that her  sclerae were icteric and suspected the patient was in liver failure.  The  patient admits to heavy drinking.  She drinks apparently half a gallon of  whiskey a day and her last drink was yesterday and already she seems to be  quite tremulous and no the verge of going into withdrawal.  Laboratories  were checked and the patient had an elevated PT/PTT and INR with a total  bilirubin of 8.  Her AST and ALT and alk phos were slightly mildly elevated.  The patient was told in the past apparently that she had liver disease  secondary to her drinking.  However, she does not follow up or cease to  drink.  She is also, based on previous discharge summaries and E chart, on a  number of medications for thyroid and bipolar disorder as well as cirrhosis  including diuretics and lactulose, but she has not been taking any of these  medications.  Apparently the patient states that she is complaining of just  some mild abdominal discomfort but otherwise she has no complaints of pain.  She is quite shaky and agitated but otherwise no other  problems.  She denied  any headaches, visual changes, dysphagia, chest pain, palpitations.  She has  occasional shortness of breath especially when lying on her back.  She  denies any abdominal pain, hematuria, dysuria, constipation, or diarrhea.  She denies any hematemesis or as noted any bright red blood or black tarry  stools.   PAST MEDICAL HISTORY:  1.  Heavy alcohol abuse.  2.  Polysubstance abuse including cocaine and marijuana.  3.  She has history, according to previous charts, of hypothyroidism and      bipolar disorder, although the patient did not bring these up herself.  4.  She has a history of depression as well.   MEDICATIONS:  The patient is not taking any medications right now.  However,  according to her discharge summary in June 2003, the patient is supposed to  be on lactulose, Depakote, Seroquel, Topamax,  and Synthroid, none of which  she is taking.   ALLERGIES:  She is allergic to MORPHINE and PROZAC.   SOCIAL HISTORY:  The patient admits to smoking marijuana and also doing  cocaine as recently as a week ago.  She drinks a half a gallon of liquor  daily.  In addition, she smokes half a pack a day which she says lasts her  several weeks.   FAMILY HISTORY:  Noncontributory.  The patient lives alone at home.   PHYSICAL EXAMINATION:  GENERAL:  The patient appears to be nervous, agitated  and has a tremor.  She is alert and oriented x3.  HEENT:  Normocephalic, atraumatic.  She has icteric sclerae.  She had dry  mucous membranes.  No carotid bruits.  HEART:  Regular rate and rhythm with a questionable 3/6 systolic ejection  murmur.  LUNGS:  Clear to auscultation bilaterally.  ABDOMEN:  Soft, obese, nontender.  She has evidence of some quarter-size  bruising in two areas.  She has a mild fluid wave but they are hard to  appreciate secondary to her obesity.  EXTREMITIES:  She has 2+ pitting edema.  Poor peripheral pulses.  In  addition she has evidence of spider  angiomas as well.   LABORATORY DATA:  The patient's PTT is elevated at 40, PT/INR 17.2 and 1.6.  Sodium 130, potassium 3, chloride 102, bicarb 27, BUN less than 1,  creatinine 0.6, glucose 115, total bilirubin 8.4, alk phos 120, AST 146, ALT  33, total protein 6.8, albumin 3, calcium 8.3.  The patient has a 3.7 white  count with a 78% neutrophils, hemoglobin 11.3, hematocrit 32.5, MCV 93,  platelet count 59.   ASSESSMENT/PLAN:  1.  Suspected cirrhosis with questionable end-stage liver disease. Will ask      GI to see.  Check a hepatitis panel, ammonia level.  Start Lasix, 2 g      sodium diet.  The patient with increasing coags.  Will go ahead and      check a CT scan of the abdomen and pelvis to evaluate ascites and make      sure that she does not have a hepatocellular carcinoma as well.  2.  Heavy alcohol abuse.  Will start Ativan protocol and banana bag and have      discussed with the patient extensively that if she is discharged and she      continues to drink, she likely will not live past several more months      and even with stopping of drinking, it may already be too late.  3.  Polysubstance abuse.  The patient admits to cocaine and marijuana.  Will      check a urine drug screen.  4.  History of hypothyroidism.  She is supposed to be on Synthroid.  Check a      TSH.  5.  History of depression and bipolar disorder.  She is supposed to be on a      number of medications.  Will ask psychiatry to see.  6.  Tobacco abuse.                                               Hollice Espy, M.D.    SKK/MEDQ  D:  08/26/2004  T:  08/26/2004  Job:  [id number]   cc:   Anselmo Rod, M.D.  [address]  [address]  [place]  [place] [address]  Fax: [phone]   Antonietta Breach, M.D.
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
## 2011-05-13 NOTE — Consult Note (Signed)
NAME:  Erin [name], Erin [name]                       ACCOUNT NO.:  [id number]   MEDICAL RECORD NO.:  [id number]                   PATIENT TYPE:  INP   LOCATION:  5739                                 FACILITY:  [facility]   PHYSICIAN:  Althea Grimmer. Luther Parody, M.D.            DATE OF BIRTH:  [birth year]   DATE OF CONSULTATION:  08/27/2004  DATE OF DISCHARGE:                                   CONSULTATION   GI CONSULTATION:   HISTORY OF PRESENT ILLNESS:  Erin [name] is a 45 year old female who I am  asked to see for abnormal liver function tests due to alcohol abuse.  She  has a longstanding history of alcohol and polysubstance abuse as well as  apparent bipolar depression.  She has been treated with psychiatric  medications in the past but apparently is off these at this time.  She says  she drinks up to 1/2 gallon liquor on a daily basis.  She also smokes 1/2  pack of cigarettes per day, uses occasional cocaine and regular marijuana.  She entered the hospital because of bruising, feeling shaky, with nausea and  malaise.  She has been found to have elevated transaminases, with an SGOT of  126 and an SGPT of 29 which are mildly decreased from initial presentation.  Her total bilirubin is 8.4, ammonia is mildly elevated.  Hepatitis A, B and  C serologies have been negative.  Prothrombin time is 16.9 with an INR of  1.6.  She had a CT scan of her liver that shows a fatty liver with  hepatomegaly and splenomegaly.  She is status post cholecystectomy.  There  is a suggestion  of a liver abnormality near the gallbladder fossa.   PAST MEDICAL HISTORY:  Pertinent for known alcoholic liver disease, bipolar  disorder, and polysubstance abuse.   IN HOSPITAL MEDICATIONS:  Ativan taper, folic acid, multivitamins, Lasix 20  mg daily, thiamine, Lactulose 30 mL q.i.d., Aldactone 25 mg b.i.d., Protonix  40 mg daily.   ALLERGIES:  MORPHINE, PROZAC.   SOCIAL HISTORY:  She says she owns her own home and is on  disability.  She  admits to polysubstance abuse as above.   FAMILY HISTORY:  Noncontributory.Marland Kitchen   REVIEW OF SYSTEMS:  Otherwise negative.   PHYSICAL EXAMINATION:  She is disheveled and somewhat tremulous, with quite  rambling speech, afebrile.  Blood pressure 123/68, pulse 115.  SKIN:  Mild icteric with multiple tattoos.  EYES:  Icteric.  Conjunctiva are pink.  Oropharynx unremarkable.  NECK:  Supple, without thyromegaly.  There is no cervical or inguinal  adenopathy.  CHEST:  Sounds clear with few basilar rhonchi.  Heart sounds mildly  tachycardic.  ABDOMEN:  Obese.  There is probably mild ascites present.  She has a ill-  defined liver that probably descends 5-6 fingerbreadths below the right  costal margin.  This is nontender however.  Rectal exam not performed.  EXTREMITIES:  Trace edema.  ADDITIONAL LABORATORIES:  Hepatitis A, B, and C serologies that are  negative.   IMPRESSION:  A 45 year old female with a history of polysubstance abuse who  seems to have alcoholic cirrhosis as well as alcoholic hepatitis at this  time.  Her alcoholic hepatitis fortunately seems fairly mild.  Her  prothrombin time is not markedly elevated and her transaminases appear to be  slightly improving.  Her albumin is fairly low at 2.8 however.  She is not  encephalopathic and has no asterixis at this time.   RECOMMENDATIONS:  Supportive care, Ativan taper as already ordered.  Oral  nutrition is very important.  The most important prognostic factors are  following her prothrombin time and her albumin.  I do not think there is any  indication for steroid use at this point.  She probably will survive this  episode of alcoholic hepatitis, however her long-term prognosis is extremely  poor.  Psychiatric consultation is advised.  She should be weighed daily.  Aldactone may be given as a one-time morning dose.  I would suggest that if  her encephalopathy remains in remission that her Lactulose be slowly  tapered  so that she does not suffer electrolyte abnormalities due to excess  diarrhea.  I will follow along and make further recommendations if needed.                                               Althea Grimmer. Luther Parody, M.D.    PJS/MEDQ  D:  08/27/2004  T:  08/27/2004  Job:  [id number]   cc:   Hollice Espy, M.D.
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
## 2011-05-20 ENCOUNTER — Inpatient Hospital Stay (HOSPITAL_COMMUNITY)
Admission: AD | Admit: 2011-05-20 | Discharge: 2011-05-20 | Disposition: A | Discharge status: Home / Self Care | Payer: Medicare Other | Source: Ambulatory Visit | Attending: Obstetrics and Gynecology | Admitting: Obstetrics and Gynecology

## 2011-05-20 DIAGNOSIS — D649 Anemia, unspecified: Secondary | ICD-10-CM | POA: Insufficient documentation

## 2011-05-20 DIAGNOSIS — N949 Unspecified condition associated with female genital organs and menstrual cycle: Secondary | ICD-10-CM | POA: Insufficient documentation

## 2011-05-20 DIAGNOSIS — N938 Other specified abnormal uterine and vaginal bleeding: Secondary | ICD-10-CM | POA: Insufficient documentation

## 2011-05-20 LAB — CBC
MCV: 76.8 fL — ABNORMAL LOW (ref 78.0–100.0)
Platelets: 94 10*3/uL — ABNORMAL LOW (ref 150–400)
RBC: 3.41 MIL/uL — ABNORMAL LOW (ref 3.87–5.11)
RDW: 17.4 % — ABNORMAL HIGH (ref 11.5–15.5)
WBC: 2.7 10*3/uL — ABNORMAL LOW (ref 4.0–10.5)

## 2011-06-08 ENCOUNTER — Emergency Department (HOSPITAL_COMMUNITY)
Admission: EM | Admit: 2011-06-08 | Discharge: 2011-06-09 | Disposition: A | Discharge status: Other Acute Inpatient Hospital | Payer: Medicare Other | Source: Home / Self Care | Attending: Emergency Medicine | Admitting: Emergency Medicine

## 2011-06-08 DIAGNOSIS — F4310 Post-traumatic stress disorder, unspecified: Secondary | ICD-10-CM

## 2011-06-08 LAB — BASIC METABOLIC PANEL
BUN: 8 mg/dL (ref 6–23)
Chloride: 100 mEq/L (ref 96–112)
Creatinine, Ser: 0.79 mg/dL (ref 0.4–1.2)
GFR calc Af Amer: >60 mL/min (ref >60)
GFR calc non Af Amer: >60 mL/min (ref >60)
Glucose, Bld: 125 mg/dL — ABNORMAL HIGH (ref 70–99)
Potassium: 3.9 mEq/L (ref 3.5–5.1)

## 2011-06-08 LAB — ETHANOL: Alcohol, Ethyl (B): 156 mg/dL — ABNORMAL HIGH (ref 0–10)

## 2011-06-08 LAB — URINALYSIS, ROUTINE W REFLEX MICROSCOPIC
Bilirubin Urine: NEGATIVE
Ketones, ur: NEGATIVE mg/dL
Leukocytes, UA: NEGATIVE
Nitrite: NEGATIVE
Protein, ur: NEGATIVE mg/dL
pH: 5 (ref 5.0–8.0)

## 2011-06-08 LAB — DIFFERENTIAL
Basophils Absolute: 0 10*3/uL (ref 0.0–0.1)
Lymphocytes Relative: 34 % (ref 12–46)
Lymphs Abs: 1.5 10*3/uL (ref 0.7–4.0)
Neutro Abs: 2.2 10*3/uL (ref 1.7–7.7)
Neutrophils Relative %: 52 % (ref 43–77)

## 2011-06-08 LAB — CBC
HCT: 26.1 % — ABNORMAL LOW (ref 36.0–46.0)
Hemoglobin: 7.7 g/dL — ABNORMAL LOW (ref 12.0–15.0)
MCV: 75 fL — ABNORMAL LOW (ref 78.0–100.0)
RBC: 3.48 MIL/uL — ABNORMAL LOW (ref 3.87–5.11)
WBC: 4.3 10*3/uL (ref 4.0–10.5)

## 2011-06-08 LAB — RAPID URINE DRUG SCREEN, HOSP PERFORMED
Amphetamines: NOT DETECTED
Barbiturates: NOT DETECTED
Benzodiazepines: NOT DETECTED
Cocaine: POSITIVE — AB

## 2011-06-09 ENCOUNTER — Inpatient Hospital Stay (HOSPITAL_COMMUNITY)
Admission: AD | Admit: 2011-06-09 | Discharge: 2011-06-12 | DRG: 897 | Disposition: A | Discharge status: Other Acute Inpatient Hospital | Payer: Medicare Other | Source: Ambulatory Visit | Attending: Psychiatry | Admitting: Psychiatry

## 2011-06-09 DIAGNOSIS — N920 Excessive and frequent menstruation with regular cycle: Secondary | ICD-10-CM

## 2011-06-09 DIAGNOSIS — Z23 Encounter for immunization: Secondary | ICD-10-CM

## 2011-06-09 DIAGNOSIS — I8500 Esophageal varices without bleeding: Secondary | ICD-10-CM

## 2011-06-09 DIAGNOSIS — E669 Obesity, unspecified: Secondary | ICD-10-CM

## 2011-06-09 DIAGNOSIS — D869 Sarcoidosis, unspecified: Secondary | ICD-10-CM

## 2011-06-09 DIAGNOSIS — F1220 Cannabis dependence, uncomplicated: Secondary | ICD-10-CM

## 2011-06-09 DIAGNOSIS — R161 Splenomegaly, not elsewhere classified: Secondary | ICD-10-CM

## 2011-06-09 DIAGNOSIS — Z560 Unemployment, unspecified: Secondary | ICD-10-CM

## 2011-06-09 DIAGNOSIS — G8929 Other chronic pain: Secondary | ICD-10-CM

## 2011-06-09 DIAGNOSIS — F1020 Alcohol dependence, uncomplicated: Principal | ICD-10-CM

## 2011-06-09 DIAGNOSIS — K7460 Unspecified cirrhosis of liver: Secondary | ICD-10-CM

## 2011-06-09 DIAGNOSIS — D61818 Other pancytopenia: Secondary | ICD-10-CM

## 2011-06-09 DIAGNOSIS — F1420 Cocaine dependence, uncomplicated: Secondary | ICD-10-CM

## 2011-06-09 DIAGNOSIS — D509 Iron deficiency anemia, unspecified: Secondary | ICD-10-CM

## 2011-06-09 DIAGNOSIS — F1994 Other psychoactive substance use, unspecified with psychoactive substance-induced mood disorder: Secondary | ICD-10-CM

## 2011-06-09 LAB — COMPREHENSIVE METABOLIC PANEL
ALT: 13 U/L (ref 0–35)
AST: 21 U/L (ref 0–37)
Albumin: 3.2 g/dL — ABNORMAL LOW (ref 3.5–5.2)
Alkaline Phosphatase: 85 U/L (ref 39–117)
CO2: 27 mEq/L (ref 19–32)
Chloride: 97 mEq/L (ref 96–112)
Creatinine, Ser: 0.9 mg/dL (ref 0.4–1.2)
GFR calc non Af Amer: >60 mL/min (ref >60)
Potassium: 4.1 mEq/L (ref 3.5–5.1)
Total Bilirubin: 0.4 mg/dL (ref 0.3–1.2)

## 2011-06-10 DIAGNOSIS — F329 Major depressive disorder, single episode, unspecified: Secondary | ICD-10-CM

## 2011-06-10 DIAGNOSIS — F1920 Other psychoactive substance dependence, uncomplicated: Secondary | ICD-10-CM

## 2011-06-10 LAB — T4, FREE: Free T4: 0.83 ng/dL (ref 0.80–1.80)

## 2011-06-11 LAB — CBC
HCT: 23 % — ABNORMAL LOW (ref 36.0–46.0)
Hemoglobin: 6.8 g/dL — CL (ref 12.0–15.0)
MCHC: 29.6 g/dL — ABNORMAL LOW (ref 30.0–36.0)
RBC: 3.09 MIL/uL — ABNORMAL LOW (ref 3.87–5.11)
WBC: 2.4 10*3/uL — ABNORMAL LOW (ref 4.0–10.5)

## 2011-06-11 LAB — DIFFERENTIAL
Basophils Relative: 0 % (ref 0–1)
Eosinophils Relative: 3 % (ref 0–5)
Lymphocytes Relative: 27 % (ref 12–46)
Monocytes Absolute: 0.3 10*3/uL (ref 0.1–1.0)
Monocytes Relative: 14 % — ABNORMAL HIGH (ref 3–12)
Neutrophils Relative %: 56 % (ref 43–77)

## 2011-06-12 ENCOUNTER — Inpatient Hospital Stay (HOSPITAL_COMMUNITY)
Admission: AD | Admit: 2011-06-12 | Discharge: 2011-06-15 | DRG: 760 | Disposition: A | Discharge status: Other Acute Inpatient Hospital | Payer: Medicare Other | Source: Ambulatory Visit | Attending: Obstetrics and Gynecology | Admitting: Obstetrics and Gynecology

## 2011-06-12 DIAGNOSIS — D61818 Other pancytopenia: Secondary | ICD-10-CM | POA: Diagnosis present

## 2011-06-12 DIAGNOSIS — F1010 Alcohol abuse, uncomplicated: Secondary | ICD-10-CM | POA: Diagnosis present

## 2011-06-12 DIAGNOSIS — F1410 Cocaine abuse, uncomplicated: Secondary | ICD-10-CM | POA: Diagnosis present

## 2011-06-12 DIAGNOSIS — D500 Iron deficiency anemia secondary to blood loss (chronic): Secondary | ICD-10-CM | POA: Diagnosis present

## 2011-06-12 DIAGNOSIS — F319 Bipolar disorder, unspecified: Secondary | ICD-10-CM | POA: Diagnosis present

## 2011-06-12 DIAGNOSIS — F209 Schizophrenia, unspecified: Secondary | ICD-10-CM | POA: Diagnosis present

## 2011-06-12 DIAGNOSIS — D869 Sarcoidosis, unspecified: Secondary | ICD-10-CM | POA: Diagnosis present

## 2011-06-12 DIAGNOSIS — N920 Excessive and frequent menstruation with regular cycle: Principal | ICD-10-CM | POA: Diagnosis present

## 2011-06-12 LAB — COMPREHENSIVE METABOLIC PANEL
Alkaline Phosphatase: 99 U/L (ref 39–117)
BUN: 17 mg/dL (ref 6–23)
CO2: 26 mEq/L (ref 19–32)
Chloride: 94 mEq/L — ABNORMAL LOW (ref 96–112)
GFR calc Af Amer: 56 mL/min — ABNORMAL LOW (ref >60)
GFR calc non Af Amer: 47 mL/min — ABNORMAL LOW (ref >60)
Glucose, Bld: 96 mg/dL (ref 70–99)
Potassium: 4 mEq/L (ref 3.5–5.1)
Total Bilirubin: 0.5 mg/dL (ref 0.3–1.2)

## 2011-06-12 LAB — CBC
MCH: 22.3 pg — ABNORMAL LOW (ref 26.0–34.0)
MCV: 75.6 fL — ABNORMAL LOW (ref 78.0–100.0)
Platelets: 78 10*3/uL — ABNORMAL LOW (ref 150–400)
RDW: 15.8 % — ABNORMAL HIGH (ref 11.5–15.5)
WBC: 1.9 10*3/uL — ABNORMAL LOW (ref 4.0–10.5)

## 2011-06-12 LAB — WET PREP, GENITAL

## 2011-06-12 LAB — IRON AND TIBC: Iron: 13 ug/dL — ABNORMAL LOW (ref 42–135)

## 2011-06-12 LAB — URINALYSIS, ROUTINE W REFLEX MICROSCOPIC
Glucose, UA: NEGATIVE mg/dL
Hgb urine dipstick: NEGATIVE
Specific Gravity, Urine: 1.025 (ref 1.005–1.030)
Urobilinogen, UA: 0.2 mg/dL (ref 0.0–1.0)

## 2011-06-12 LAB — POCT PREGNANCY, URINE: Preg Test, Ur: NEGATIVE

## 2011-06-13 LAB — GC/CHLAMYDIA PROBE AMP, GENITAL
Chlamydia, DNA Probe: NEGATIVE
GC Probe Amp, Genital: NEGATIVE

## 2011-06-14 LAB — CROSSMATCH
ABO/RH(D): AB POS
Unit division: 0
Unit division: 0

## 2011-06-14 LAB — CBC
Hemoglobin: 8.9 g/dL — ABNORMAL LOW (ref 12.0–15.0)
MCHC: 30.5 g/dL (ref 30.0–36.0)
MCV: 76.2 fL — ABNORMAL LOW (ref 78.0–100.0)
Platelets: 92 10*3/uL — ABNORMAL LOW (ref 150–400)
Platelets: 89 10*3/uL — ABNORMAL LOW (ref 150–400)
RBC: 3.4 MIL/uL — ABNORMAL LOW (ref 3.87–5.11)
RDW: 15.8 % — ABNORMAL HIGH (ref 11.5–15.5)
WBC: 2.1 10*3/uL — ABNORMAL LOW (ref 4.0–10.5)

## 2011-06-15 ENCOUNTER — Inpatient Hospital Stay (HOSPITAL_COMMUNITY)
Admission: AD | Admit: 2011-06-15 | Discharge: 2011-06-27 | DRG: 885 | Disposition: A | Discharge status: Home / Self Care | Payer: Medicare Other | Source: Ambulatory Visit | Attending: Psychiatry | Admitting: Psychiatry

## 2011-06-15 DIAGNOSIS — F1220 Cannabis dependence, uncomplicated: Secondary | ICD-10-CM

## 2011-06-15 DIAGNOSIS — F1420 Cocaine dependence, uncomplicated: Secondary | ICD-10-CM

## 2011-06-15 DIAGNOSIS — F411 Generalized anxiety disorder: Secondary | ICD-10-CM

## 2011-06-15 DIAGNOSIS — F4310 Post-traumatic stress disorder, unspecified: Secondary | ICD-10-CM

## 2011-06-15 DIAGNOSIS — R161 Splenomegaly, not elsewhere classified: Secondary | ICD-10-CM

## 2011-06-15 DIAGNOSIS — K2210 Ulcer of esophagus without bleeding: Secondary | ICD-10-CM

## 2011-06-15 DIAGNOSIS — I8500 Esophageal varices without bleeding: Secondary | ICD-10-CM

## 2011-06-15 DIAGNOSIS — K7460 Unspecified cirrhosis of liver: Secondary | ICD-10-CM

## 2011-06-15 DIAGNOSIS — N920 Excessive and frequent menstruation with regular cycle: Secondary | ICD-10-CM

## 2011-06-15 DIAGNOSIS — F1020 Alcohol dependence, uncomplicated: Secondary | ICD-10-CM

## 2011-06-15 DIAGNOSIS — D869 Sarcoidosis, unspecified: Secondary | ICD-10-CM

## 2011-06-15 DIAGNOSIS — D509 Iron deficiency anemia, unspecified: Secondary | ICD-10-CM

## 2011-06-15 DIAGNOSIS — R45851 Suicidal ideations: Secondary | ICD-10-CM

## 2011-06-15 DIAGNOSIS — F331 Major depressive disorder, recurrent, moderate: Principal | ICD-10-CM
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
## 2011-06-16 ENCOUNTER — Encounter: Payer: Medicare Other | Admitting: Obstetrics & Gynecology

## 2011-06-16 DIAGNOSIS — F1220 Cannabis dependence, uncomplicated: Secondary | ICD-10-CM

## 2011-06-16 DIAGNOSIS — F1420 Cocaine dependence, uncomplicated: Secondary | ICD-10-CM

## 2011-06-16 DIAGNOSIS — F339 Major depressive disorder, recurrent, unspecified: Secondary | ICD-10-CM

## 2011-06-16 DIAGNOSIS — F1020 Alcohol dependence, uncomplicated: Secondary | ICD-10-CM

## 2011-06-16 LAB — COMPREHENSIVE METABOLIC PANEL
ALT: 25 U/L (ref 0–35)
Alkaline Phosphatase: 105 U/L (ref 39–117)
CO2: 27 mEq/L (ref 19–32)
Chloride: 102 mEq/L (ref 96–112)
GFR calc Af Amer: >60 mL/min (ref >60)
Glucose, Bld: 117 mg/dL — ABNORMAL HIGH (ref 70–99)
Potassium: 3.9 mEq/L (ref 3.5–5.1)
Sodium: 136 mEq/L (ref 135–145)
Total Bilirubin: 0.6 mg/dL (ref 0.3–1.2)
Total Protein: 6 g/dL (ref 6.0–8.3)

## 2011-06-16 LAB — DIFFERENTIAL
Basophils Absolute: 0 10*3/uL (ref 0.0–0.1)
Eosinophils Absolute: 0.1 10*3/uL (ref 0.0–0.7)
Lymphocytes Relative: 30 % (ref 12–46)
Neutrophils Relative %: 57 % (ref 43–77)
Smear Review: ADEQUATE

## 2011-06-16 LAB — CBC
Hemoglobin: 8.9 g/dL — ABNORMAL LOW (ref 12.0–15.0)
MCHC: 31.2 g/dL (ref 30.0–36.0)
RBC: 3.72 MIL/uL — ABNORMAL LOW (ref 3.87–5.11)
WBC: 2.6 10*3/uL — ABNORMAL LOW (ref 4.0–10.5)

## 2011-06-17 LAB — IRON AND TIBC: UIBC: 421 ug/dL
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
## 2011-06-19 LAB — DIFFERENTIAL
Eosinophils Relative: 3 % (ref 0–5)
Lymphocytes Relative: 31 % (ref 12–46)
Lymphs Abs: 0.7 10*3/uL (ref 0.7–4.0)
Neutrophils Relative %: 56 % (ref 43–77)

## 2011-06-19 LAB — COMPREHENSIVE METABOLIC PANEL
BUN: 15 mg/dL (ref 6–23)
CO2: 27 mEq/L (ref 19–32)
Chloride: 100 mEq/L (ref 96–112)
Creatinine, Ser: 0.91 mg/dL (ref 0.50–1.10)
GFR calc non Af Amer: >60 mL/min (ref >60)
Total Bilirubin: 0.4 mg/dL (ref 0.3–1.2)

## 2011-06-19 LAB — T4, FREE: Free T4: 0.75 ng/dL — ABNORMAL LOW (ref 0.80–1.80)

## 2011-06-19 LAB — CBC
HCT: 31.5 % — ABNORMAL LOW (ref 36.0–46.0)
MCV: 76.8 fL — ABNORMAL LOW (ref 78.0–100.0)
RBC: 4.1 MIL/uL (ref 3.87–5.11)
WBC: 2.4 10*3/uL — ABNORMAL LOW (ref 4.0–10.5)

## 2011-06-19 LAB — T3 UPTAKE: T3 Uptake Ratio: 32.8 % (ref 22.5–37.0)
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
## 2011-06-21 LAB — DIFFERENTIAL
Basophils Absolute: 0 10*3/uL (ref 0.0–0.1)
Basophils Relative: 1 % (ref 0–1)
Eosinophils Relative: 3 % (ref 0–5)
Monocytes Absolute: 0.3 10*3/uL (ref 0.1–1.0)

## 2011-06-21 LAB — CBC
HCT: 31.1 % — ABNORMAL LOW (ref 36.0–46.0)
MCHC: 30.2 g/dL (ref 30.0–36.0)
RDW: 17.5 % — ABNORMAL HIGH (ref 11.5–15.5)

## 2011-06-21 NOTE — Discharge Summary (Signed)
Erin [name], Erin [name]             ACCOUNT NO.:  [id number]  MEDICAL RECORD NO.:  [id number]  LOCATION:  9305                          FACILITY:  [facility]  PHYSICIAN:  Janine Limbo, M.D.DATE OF BIRTH:  Jan 06, 1966  DATE OF ADMISSION:  06/12/2011 DATE OF DISCHARGE:  06/15/2011                              DISCHARGE SUMMARY   ADMISSION DIAGNOSES: 1. Menorrhagia. 2. Anemia (hemoglobin is 6.4). 3. History of uterine polyps. 4. Pancytopenia. 5. Multiple substance abuse. 6. Noncompliance. 7. Sarcoid.  DISCHARGE DIAGNOSES: 1. Menorrhagia. 2. Anemia (hemoglobin is 8.9). 3. History of uterine polyps. 4. Pancytopenia. 5. Multiple substance abuse. 6. Noncompliance. 7. Sarcoid. 8. Suicide watch.  PROCEDURES THIS ADMISSION:  Blood transfusion.  HISTORY OF PRESENT ILLNESS:  Erin [name] is a 45 year old female who was transferred from Behavioral Health to the [facility] [place].  The patient has been followed at the [facility] of [facility]. The patient has a known history of anemia and menorrhagia.  A hydrosonogram as an outpatient showed endometrial polyps.  The patient has been treated with progestin therapy.  The patient is noncompliant. She did not show at the hospital for her surgery.  She has missed multiple followup visits in the office.  Plans have been made to discharge the patient from our private practice.  However, on June 09, 2011, the patient presented to the [facility] for voluntary admission.  The patient has a long history of multiple substance abuses including alcohol and crack cocaine.  She has had multiple admissions in the past.  She was evaluated and found to have a hemoglobin of 7.7 and a hematocrit of 26.1.  She reported being very weak.  The patient was then transferred to [facility] for gynecologic care.  Please see her Behavioral Health admission history and  physical exam for more details.  ADMISSION EXAM:  The patient was noted to have a small amount of blood in the vaginal vault.  The uterus was thought to be upper limits of normal size.  The uterus was thought to be moderately tender.  No adnexal masses were appreciated.  HOSPITAL COURSE:  The patient was admitted to the [facility] [place].  Supportive management was begun.  The patient was transfused a total of 4 units of packed red blood cells.  Her post transfusion hemoglobin was 8.9.  The patient has a long history of pancytopenia.  Her post transfusion white blood cell count was 2600. Her post transfusion platelet count was 89,000.  Orthostatic blood pressures and pulses were normal.  The patient's HIV screen was negative.  Her gonorrhea test was negative.  Her Chlamydia test was negative.  After a long discussion with the patient, it was decided that definitive therapy (hysterectomy) was not appropriate at this time because the projected benefit of the procedure was not felt to outlay the potential risks.  The patient reported being suicidal and suicide precautions were begun.  Plans were made for the patient to be transferred back to [facility] for evaluation and treatment.  FOLLOWUP INSTRUCTIONS:  The patient will be transferred to [facility] on June 15, 2011 for followup treatment.  An  appointment has been made at the [facility] for further gynecologic care.  The patient is to be there at 1:30 with her doctor's visit scheduled for 1:45.  The patient was given the formal discharge form from our office discharging her as a patient.  She will be seen in the next 30 days for any emergency that arises if the GYN Clinic cannot take care of her needs.  DISCHARGE MEDICATIONS:  The patient will continue her current medications unless it is felt to be appropriate by the Behavioral Health team to change those.  Those  discharge medications include: 1. Seroquel 50 mg 1 tablet each morning and 200 mg 1 tablet at     bedtime. 2. She will take Protonix 40 mg 1 tablet each day. 3. Prednisone 10 mg 4 tablets daily. 4. Oxycodone 5 mg to 20 mg for pain. 5. MiraLax 1 capsule twice daily. 6. Hydroxyzine 25 mg 1 tablet every 6 hours. 7. Docusate 100 mg 1 tablet twice each day. 8. She will also receive a Depo-Provera shot 150 mg IM prior to     leaving the [facility] [place].     Janine Limbo, M.D.     AVS/MEDQ  D:  06/15/2011  T:  06/15/2011  Job:  [id number]  cc:   Outpatient Clinic  Behavioral Health  Electronically Signed by Kirkland Hun M.D. on 06/21/2011 07:25:59 AM

## 2011-06-23 LAB — DIFFERENTIAL
Basophils Absolute: 0 10*3/uL (ref 0.0–0.1)
Basophils Relative: 1 % (ref 0–1)
Eosinophils Absolute: 0.1 10*3/uL (ref 0.0–0.7)
Monocytes Absolute: 0.2 10*3/uL (ref 0.1–1.0)
Neutro Abs: 1.2 10*3/uL — ABNORMAL LOW (ref 1.7–7.7)
Neutrophils Relative %: 52 % (ref 43–77)

## 2011-06-23 LAB — COMPREHENSIVE METABOLIC PANEL
ALT: 33 U/L (ref 0–35)
AST: 36 U/L (ref 0–37)
Albumin: 3.5 g/dL (ref 3.5–5.2)
Alkaline Phosphatase: 88 U/L (ref 39–117)
Glucose, Bld: 121 mg/dL — ABNORMAL HIGH (ref 70–99)
Potassium: 4.1 mEq/L (ref 3.5–5.1)
Sodium: 137 mEq/L (ref 135–145)
Total Protein: 7.1 g/dL (ref 6.0–8.3)

## 2011-06-23 LAB — CBC
Hemoglobin: 8.7 g/dL — ABNORMAL LOW (ref 12.0–15.0)
MCHC: 30 g/dL (ref 30.0–36.0)
Platelets: 156 10*3/uL (ref 150–400)

## 2011-06-24 ENCOUNTER — Ambulatory Visit: Payer: Medicare Other | Admitting: Obstetrics & Gynecology

## 2011-06-24 DIAGNOSIS — N920 Excessive and frequent menstruation with regular cycle: Secondary | ICD-10-CM
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
## 2011-06-26 DIAGNOSIS — F1220 Cannabis dependence, uncomplicated: Secondary | ICD-10-CM

## 2011-06-26 DIAGNOSIS — IMO0001 Reserved for inherently not codable concepts without codable children: Secondary | ICD-10-CM

## 2011-06-26 DIAGNOSIS — F339 Major depressive disorder, recurrent, unspecified: Secondary | ICD-10-CM

## 2011-06-26 DIAGNOSIS — F1420 Cocaine dependence, uncomplicated: Secondary | ICD-10-CM

## 2011-06-26 DIAGNOSIS — Z5189 Encounter for other specified aftercare: Secondary | ICD-10-CM

## 2011-06-26 DIAGNOSIS — F1020 Alcohol dependence, uncomplicated: Secondary | ICD-10-CM

## 2011-06-26 HISTORY — DX: Encounter for other specified aftercare: Z51.89

## 2011-06-26 HISTORY — DX: Reserved for inherently not codable concepts without codable children: IMO0001
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
## 2011-07-18 NOTE — Patient Instructions (Addendum)
Erin [name]  07/18/2011   Your procedure is scheduled on:  Wednesday  Report to [facility] at 1130 AM.  Call this number if you have problems the morning of surgery: [phone]   Remember:   Do not eat food:After Midnight.  Do not drink clear liquids: 4 Hours before arrival.  Take these medicines the morning of surgery with A SIP OF WATER: NA   Do not wear jewelry, make-up or nail polish.  Do not bring valuables to the hospital.  Contacts, dentures or bridgework may not be worn into surgery.  Leave suitcase in the car. After surgery it may be brought to your room.  For patients admitted to the hospital, checkout time is 11:00 AM the day of discharge.   Patients discharged the day of surgery will not be allowed to drive home.  Name and phone number of your driver:NA  Special Instructions:   Please read over the following fact sheets that you were given:

## 2011-07-19 ENCOUNTER — Encounter (HOSPITAL_COMMUNITY)
Admission: RE | Admit: 2011-07-19 | Discharge: 2011-07-19 | Disposition: A | Discharge status: Home / Self Care | Payer: Medicare Other | Source: Ambulatory Visit | Attending: Obstetrics & Gynecology | Admitting: Obstetrics & Gynecology

## 2011-07-19 ENCOUNTER — Encounter (HOSPITAL_COMMUNITY): Payer: Self-pay

## 2011-07-19 HISTORY — DX: Encounter for other specified aftercare: Z51.89

## 2011-07-19 HISTORY — DX: Hypothyroidism, unspecified: E03.9

## 2011-07-19 HISTORY — DX: Anxiety disorder, unspecified: F41.9

## 2011-07-19 HISTORY — DX: Bipolar disorder, unspecified: F31.9

## 2011-07-19 HISTORY — DX: Major depressive disorder, single episode, unspecified: F32.9

## 2011-07-19 HISTORY — DX: Depression, unspecified: F32.A

## 2011-07-19 LAB — CBC
HCT: 34.4 % — ABNORMAL LOW (ref 36.0–46.0)
Hemoglobin: 10.5 g/dL — ABNORMAL LOW (ref 12.0–15.0)
MCV: 81.7 fL (ref 78.0–100.0)
RBC: 4.21 MIL/uL (ref 3.87–5.11)
WBC: 2.4 10*3/uL — ABNORMAL LOW (ref 4.0–10.5)

## 2011-07-19 LAB — SURGICAL PCR SCREEN: Staphylococcus aureus: POSITIVE — AB

## 2011-07-27 ENCOUNTER — Encounter (HOSPITAL_COMMUNITY): Payer: Self-pay | Admitting: *Deleted

## 2011-07-27 ENCOUNTER — Ambulatory Visit (HOSPITAL_COMMUNITY): Payer: Medicare Other | Admitting: Anesthesiology

## 2011-07-27 ENCOUNTER — Other Ambulatory Visit: Payer: Self-pay | Admitting: Obstetrics & Gynecology

## 2011-07-27 ENCOUNTER — Encounter (HOSPITAL_COMMUNITY): Payer: Self-pay | Admitting: Anesthesiology

## 2011-07-27 ENCOUNTER — Inpatient Hospital Stay (HOSPITAL_COMMUNITY)
Admission: RE | Admit: 2011-07-27 | Discharge: 2011-07-28 | Disposition: A | Discharge status: Still In Hospital | Payer: Medicare Other | Source: Ambulatory Visit | Attending: Obstetrics & Gynecology | Admitting: Obstetrics & Gynecology

## 2011-07-27 ENCOUNTER — Encounter (HOSPITAL_COMMUNITY)
Admission: RE | Disposition: A | Discharge status: Still In Hospital | Payer: Self-pay | Source: Ambulatory Visit | Attending: Obstetrics & Gynecology

## 2011-07-27 ENCOUNTER — Other Ambulatory Visit: Payer: Self-pay

## 2011-07-27 ENCOUNTER — Encounter (HOSPITAL_COMMUNITY): Payer: Self-pay | Admitting: Obstetrics & Gynecology

## 2011-07-27 DIAGNOSIS — F209 Schizophrenia, unspecified: Secondary | ICD-10-CM | POA: Diagnosis present

## 2011-07-27 DIAGNOSIS — F319 Bipolar disorder, unspecified: Secondary | ICD-10-CM | POA: Diagnosis present

## 2011-07-27 DIAGNOSIS — IMO0002 Reserved for concepts with insufficient information to code with codable children: Secondary | ICD-10-CM | POA: Diagnosis not present

## 2011-07-27 DIAGNOSIS — G894 Chronic pain syndrome: Secondary | ICD-10-CM | POA: Diagnosis present

## 2011-07-27 DIAGNOSIS — T8132XA Disruption of internal operation (surgical) wound, not elsewhere classified, initial encounter: Secondary | ICD-10-CM | POA: Diagnosis not present

## 2011-07-27 DIAGNOSIS — R000 Tachycardia, unspecified: Secondary | ICD-10-CM | POA: Diagnosis not present

## 2011-07-27 DIAGNOSIS — T81329A Deep disruption or dehiscence of operation wound, unspecified, initial encounter: Secondary | ICD-10-CM | POA: Diagnosis not present

## 2011-07-27 DIAGNOSIS — N840 Polyp of corpus uteri: Secondary | ICD-10-CM | POA: Diagnosis present

## 2011-07-27 DIAGNOSIS — D649 Anemia, unspecified: Secondary | ICD-10-CM | POA: Diagnosis not present

## 2011-07-27 DIAGNOSIS — J811 Chronic pulmonary edema: Secondary | ICD-10-CM | POA: Diagnosis not present

## 2011-07-27 DIAGNOSIS — F341 Dysthymic disorder: Secondary | ICD-10-CM | POA: Diagnosis present

## 2011-07-27 DIAGNOSIS — R0609 Other forms of dyspnea: Secondary | ICD-10-CM | POA: Diagnosis not present

## 2011-07-27 DIAGNOSIS — I9589 Other hypotension: Secondary | ICD-10-CM | POA: Diagnosis not present

## 2011-07-27 DIAGNOSIS — F1010 Alcohol abuse, uncomplicated: Secondary | ICD-10-CM | POA: Diagnosis present

## 2011-07-27 DIAGNOSIS — J9600 Acute respiratory failure, unspecified whether with hypoxia or hypercapnia: Secondary | ICD-10-CM | POA: Diagnosis not present

## 2011-07-27 DIAGNOSIS — K766 Portal hypertension: Secondary | ICD-10-CM | POA: Diagnosis present

## 2011-07-27 DIAGNOSIS — N920 Excessive and frequent menstruation with regular cycle: Secondary | ICD-10-CM | POA: Diagnosis present

## 2011-07-27 DIAGNOSIS — K2100 Gastro-esophageal reflux disease with esophagitis, without bleeding: Secondary | ICD-10-CM | POA: Diagnosis present

## 2011-07-27 DIAGNOSIS — E039 Hypothyroidism, unspecified: Secondary | ICD-10-CM | POA: Diagnosis present

## 2011-07-27 DIAGNOSIS — E876 Hypokalemia: Secondary | ICD-10-CM | POA: Diagnosis not present

## 2011-07-27 DIAGNOSIS — R0989 Other specified symptoms and signs involving the circulatory and respiratory systems: Secondary | ICD-10-CM | POA: Diagnosis not present

## 2011-07-27 DIAGNOSIS — Y836 Removal of other organ (partial) (total) as the cause of abnormal reaction of the patient, or of later complication, without mention of misadventure at the time of the procedure: Secondary | ICD-10-CM | POA: Diagnosis not present

## 2011-07-27 DIAGNOSIS — K7030 Alcoholic cirrhosis of liver without ascites: Secondary | ICD-10-CM | POA: Diagnosis present

## 2011-07-27 DIAGNOSIS — K661 Hemoperitoneum: Secondary | ICD-10-CM | POA: Diagnosis not present

## 2011-07-27 DIAGNOSIS — D259 Leiomyoma of uterus, unspecified: Secondary | ICD-10-CM | POA: Diagnosis present

## 2011-07-27 DIAGNOSIS — D61818 Other pancytopenia: Secondary | ICD-10-CM | POA: Diagnosis not present

## 2011-07-27 DIAGNOSIS — R578 Other shock: Secondary | ICD-10-CM | POA: Diagnosis not present

## 2011-07-27 DIAGNOSIS — F17200 Nicotine dependence, unspecified, uncomplicated: Secondary | ICD-10-CM | POA: Diagnosis present

## 2011-07-27 DIAGNOSIS — N179 Acute kidney failure, unspecified: Secondary | ICD-10-CM | POA: Diagnosis not present

## 2011-07-27 DIAGNOSIS — Z79899 Other long term (current) drug therapy: Secondary | ICD-10-CM

## 2011-07-27 DIAGNOSIS — Z01812 Encounter for preprocedural laboratory examination: Secondary | ICD-10-CM

## 2011-07-27 DIAGNOSIS — E871 Hypo-osmolality and hyponatremia: Secondary | ICD-10-CM | POA: Diagnosis not present

## 2011-07-27 DIAGNOSIS — Y9210 Unspecified residential institution as the place of occurrence of the external cause: Secondary | ICD-10-CM | POA: Diagnosis not present

## 2011-07-27 DIAGNOSIS — R34 Anuria and oliguria: Secondary | ICD-10-CM

## 2011-07-27 HISTORY — DX: Excessive and frequent menstruation with regular cycle: N92.0

## 2011-07-27 HISTORY — PX: VAGINAL HYSTERECTOMY: SHX2639

## 2011-07-27 LAB — COMPREHENSIVE METABOLIC PANEL
AST: 21 U/L (ref 0–37)
Albumin: 4.1 g/dL (ref 3.5–5.2)
Chloride: 105 mEq/L (ref 96–112)
Creatinine, Ser: 0.78 mg/dL (ref 0.50–1.10)
Potassium: 3.8 mEq/L (ref 3.5–5.1)
Total Bilirubin: 0.8 mg/dL (ref 0.3–1.2)
Total Protein: 8 g/dL (ref 6.0–8.3)

## 2011-07-27 LAB — CBC
Hemoglobin: 11 g/dL — ABNORMAL LOW (ref 12.0–15.0)
MCH: 26.4 pg (ref 26.0–34.0)
MCHC: 32.1 g/dL (ref 30.0–36.0)
MCV: 83.3 fL (ref 78.0–100.0)
Platelets: 257 10*3/uL (ref 150–400)
RDW: 24.4 % — ABNORMAL HIGH (ref 11.5–15.5)
RDW: 24.6 % — ABNORMAL HIGH (ref 11.5–15.5)
WBC: 13.7 10*3/uL — ABNORMAL HIGH (ref 4.0–10.5)

## 2011-07-27 LAB — PREGNANCY, URINE: Preg Test, Ur: NEGATIVE

## 2011-07-27 LAB — PROTIME-INR
INR: 1.12 (ref 0.00–1.49)
Prothrombin Time: 14.6 seconds (ref 11.6–15.2)

## 2011-07-27 LAB — APTT: aPTT: 32 seconds (ref 24–37)

## 2011-07-27 SURGERY — HYSTERECTOMY, VAGINAL
Anesthesia: General | Wound class: Clean Contaminated

## 2011-07-27 MED ORDER — QUETIAPINE FUMARATE 200 MG PO TABS
200.0000 mg | ORAL_TABLET | Freq: Three times a day (TID) | ORAL | Status: DC
  Administered 2011-07-27: 400 mg via ORAL
  Filled 2011-07-27 (×2): qty 2

## 2011-07-27 MED ORDER — CEFAZOLIN SODIUM 1-5 GM-% IV SOLN
INTRAVENOUS | Status: AC
  Filled 2011-07-27: qty 50

## 2011-07-27 MED ORDER — HYDROMORPHONE HCL 1 MG/ML IJ SOLN
0.2500 mg | INTRAMUSCULAR | Status: DC | PRN
  Administered 2011-07-27 (×4): 0.5 mg via INTRAVENOUS

## 2011-07-27 MED ORDER — CARISOPRODOL 350 MG PO TABS
350.0000 mg | ORAL_TABLET | Freq: Every day | ORAL | Status: DC
  Administered 2011-07-27: 350 mg via ORAL
  Filled 2011-07-27: qty 1

## 2011-07-27 MED ORDER — CEFAZOLIN SODIUM 1-5 GM-% IV SOLN: 1.0000 g | INTRAVENOUS | Status: DC

## 2011-07-27 MED ORDER — SCOPOLAMINE 1 MG/3DAYS TD PT72: 1.0000 | MEDICATED_PATCH | Freq: Once | TRANSDERMAL | Status: DC | PRN

## 2011-07-27 MED ORDER — FENTANYL CITRATE 0.05 MG/ML IJ SOLN
INTRAMUSCULAR | Status: DC | PRN
  Administered 2011-07-27: 100 ug via INTRAVENOUS
  Administered 2011-07-27: 150 ug via INTRAVENOUS
  Administered 2011-07-27: 100 ug via INTRAVENOUS
  Administered 2011-07-27: 50 ug via INTRAVENOUS
  Administered 2011-07-27: 100 ug via INTRAVENOUS

## 2011-07-27 MED ORDER — OXYCODONE-ACETAMINOPHEN 5-325 MG PO TABS
1.0000 | ORAL_TABLET | ORAL | Status: DC | PRN
  Administered 2011-07-28: 2 via ORAL
  Filled 2011-07-27: qty 2

## 2011-07-27 MED ORDER — ONDANSETRON HCL 4 MG/2ML IJ SOLN
INTRAMUSCULAR | Status: DC | PRN
  Administered 2011-07-27: 4 mg via INTRAVENOUS

## 2011-07-27 MED ORDER — QUETIAPINE FUMARATE 400 MG PO TABS
400.0000 mg | ORAL_TABLET | Freq: Every day | ORAL | Status: DC
  Filled 2011-07-27 (×2): qty 1

## 2011-07-27 MED ORDER — HYDROMORPHONE HCL 1 MG/ML IJ SOLN
INTRAMUSCULAR | Status: AC
  Administered 2011-07-27: 0.5 mg via INTRAVENOUS
  Filled 2011-07-27: qty 1

## 2011-07-27 MED ORDER — PROPOFOL 10 MG/ML IV EMUL
INTRAVENOUS | Status: AC
  Filled 2011-07-27: qty 20

## 2011-07-27 MED ORDER — LACTATED RINGERS IV SOLN
INTRAVENOUS | Status: DC
  Administered 2011-07-28 (×2): via INTRAVENOUS

## 2011-07-27 MED ORDER — GABAPENTIN 800 MG PO TABS
800.0000 mg | ORAL_TABLET | Freq: Three times a day (TID) | ORAL | Status: DC
  Filled 2011-07-27 (×2): qty 1

## 2011-07-27 MED ORDER — MIDAZOLAM HCL 2 MG/2ML IJ SOLN
INTRAMUSCULAR | Status: AC
  Filled 2011-07-27: qty 2

## 2011-07-27 MED ORDER — QUETIAPINE FUMARATE 200 MG PO TABS
200.0000 mg | ORAL_TABLET | ORAL | Status: DC
  Administered 2011-07-28: 200 mg via ORAL
  Filled 2011-07-27 (×5): qty 1

## 2011-07-27 MED ORDER — NALOXONE HCL 0.4 MG/ML IJ SOLN
0.4000 mg | INTRAMUSCULAR | Status: DC | PRN
  Administered 2011-07-28: 0.4 mg via INTRAVENOUS
  Filled 2011-07-27: qty 1

## 2011-07-27 MED ORDER — DIPHENHYDRAMINE HCL 50 MG/ML IJ SOLN: 12.5000 mg | Freq: Four times a day (QID) | INTRAMUSCULAR | Status: DC | PRN

## 2011-07-27 MED ORDER — LACTATED RINGERS IV SOLN: INTRAVENOUS | Status: DC

## 2011-07-27 MED ORDER — DEXAMETHASONE SODIUM PHOSPHATE 4 MG/ML IJ SOLN
INTRAMUSCULAR | Status: DC | PRN
  Administered 2011-07-27: 4 mg via INTRAVENOUS

## 2011-07-27 MED ORDER — HYDROMORPHONE 0.3 MG/ML IV SOLN
INTRAVENOUS | Status: AC
  Filled 2011-07-27: qty 25

## 2011-07-27 MED ORDER — DIPHENHYDRAMINE HCL 12.5 MG/5ML PO ELIX
12.5000 mg | ORAL_SOLUTION | Freq: Four times a day (QID) | ORAL | Status: DC | PRN
  Filled 2011-07-27: qty 5

## 2011-07-27 MED ORDER — FENTANYL CITRATE 0.05 MG/ML IJ SOLN
INTRAMUSCULAR | Status: AC
  Filled 2011-07-27: qty 5

## 2011-07-27 MED ORDER — HYDROMORPHONE HCL 1 MG/ML IJ SOLN
INTRAMUSCULAR | Status: DC | PRN
  Administered 2011-07-27: 1 mg via INTRAVENOUS

## 2011-07-27 MED ORDER — LIDOCAINE HCL (CARDIAC) 20 MG/ML IV SOLN
INTRAVENOUS | Status: DC | PRN
  Administered 2011-07-27 (×2): 30 mg via INTRAVENOUS

## 2011-07-27 MED ORDER — LACTATED RINGERS IV SOLN
INTRAVENOUS | Status: DC
  Administered 2011-07-27: 12:00:00 via INTRAVENOUS

## 2011-07-27 MED ORDER — HYDROMORPHONE 0.3 MG/ML IV SOLN
INTRAVENOUS | Status: DC
  Administered 2011-07-27: 19:00:00 via INTRAVENOUS
  Administered 2011-07-28: 2.19 mg via INTRAVENOUS

## 2011-07-27 MED ORDER — BUPROPION HCL ER (SR) 150 MG PO TB12
150.0000 mg | ORAL_TABLET | Freq: Every day | ORAL | Status: DC
  Administered 2011-07-27 – 2011-07-28 (×2): 150 mg via ORAL
  Filled 2011-07-27 (×4): qty 1

## 2011-07-27 MED ORDER — CLONAZEPAM 1 MG PO TABS
0.5000 mg | ORAL_TABLET | Freq: Four times a day (QID) | ORAL | Status: DC | PRN
  Administered 2011-07-27: 0.5 mg via ORAL
  Filled 2011-07-27: qty 1

## 2011-07-27 MED ORDER — MIDAZOLAM HCL 5 MG/5ML IJ SOLN
INTRAMUSCULAR | Status: DC | PRN
  Administered 2011-07-27: 2 mg via INTRAVENOUS

## 2011-07-27 MED ORDER — GABAPENTIN 400 MG PO CAPS
800.0000 mg | ORAL_CAPSULE | Freq: Three times a day (TID) | ORAL | Status: DC
  Administered 2011-07-27 – 2011-07-28 (×3): 800 mg via ORAL
  Filled 2011-07-27 (×7): qty 2

## 2011-07-27 MED ORDER — CEFAZOLIN SODIUM 1-5 GM-% IV SOLN
INTRAVENOUS | Status: DC | PRN
  Administered 2011-07-27: 2 g via INTRAVENOUS

## 2011-07-27 MED ORDER — DULOXETINE HCL 60 MG PO CPEP
60.0000 mg | ORAL_CAPSULE | Freq: Two times a day (BID) | ORAL | Status: DC
  Administered 2011-07-27 – 2011-07-28 (×2): 60 mg via ORAL
  Filled 2011-07-27 (×5): qty 1

## 2011-07-27 MED ORDER — TEMAZEPAM 15 MG PO CAPS: 15.0000 mg | ORAL_CAPSULE | Freq: Every evening | ORAL | Status: DC | PRN

## 2011-07-27 MED ORDER — PROMETHAZINE HCL 25 MG/ML IJ SOLN: 12.5000 mg | Freq: Once | INTRAMUSCULAR | Status: DC

## 2011-07-27 MED ORDER — PANTOPRAZOLE SODIUM 40 MG PO TBEC: 40.0000 mg | DELAYED_RELEASE_TABLET | Freq: Once | ORAL | Status: DC | PRN

## 2011-07-27 MED ORDER — GLYCOPYRROLATE 0.2 MG/ML IJ SOLN
INTRAMUSCULAR | Status: DC | PRN
  Administered 2011-07-27: .8 mg via INTRAVENOUS

## 2011-07-27 MED ORDER — CITRIC ACID-SODIUM CITRATE 334-500 MG/5ML PO SOLN: 30.0000 mL | Freq: Once | ORAL | Status: DC | PRN

## 2011-07-27 MED ORDER — ONDANSETRON HCL 4 MG/2ML IJ SOLN
4.0000 mg | Freq: Four times a day (QID) | INTRAMUSCULAR | Status: DC | PRN
  Administered 2011-07-27 – 2011-07-28 (×2): 4 mg via INTRAVENOUS
  Filled 2011-07-27 (×4): qty 2

## 2011-07-27 MED ORDER — ROCURONIUM BROMIDE 100 MG/10ML IV SOLN
INTRAVENOUS | Status: DC | PRN
  Administered 2011-07-27 (×2): 20 mg via INTRAVENOUS
  Administered 2011-07-27: 30 mg via INTRAVENOUS

## 2011-07-27 MED ORDER — PROPOFOL 10 MG/ML IV EMUL
INTRAVENOUS | Status: DC | PRN
  Administered 2011-07-27: 50 mg via INTRAVENOUS
  Administered 2011-07-27: 200 mg via INTRAVENOUS
  Administered 2011-07-27: 50 mg via INTRAVENOUS

## 2011-07-27 MED ORDER — LIDOCAINE-EPINEPHRINE (PF) 1 %-1:200000 IJ SOLN
INTRAMUSCULAR | Status: DC | PRN
  Administered 2011-07-27: 30 mL via INTRADERMAL

## 2011-07-27 MED ORDER — SENNOSIDES-DOCUSATE SODIUM 8.6-50 MG PO TABS: 2.0000 | ORAL_TABLET | Freq: Every day | ORAL | Status: DC | PRN

## 2011-07-27 MED ORDER — KETOROLAC TROMETHAMINE 30 MG/ML IJ SOLN: 30.0000 mg | Freq: Four times a day (QID) | INTRAMUSCULAR | Status: DC

## 2011-07-27 MED ORDER — ONDANSETRON HCL 4 MG PO TABS: 4.0000 mg | ORAL_TABLET | Freq: Four times a day (QID) | ORAL | Status: DC | PRN

## 2011-07-27 MED ORDER — NEOSTIGMINE METHYLSULFATE 1 MG/ML IJ SOLN
INTRAMUSCULAR | Status: DC | PRN
  Administered 2011-07-27: 4 mg via INTRAMUSCULAR

## 2011-07-27 MED ORDER — FAMOTIDINE 20 MG PO TABS: 20.0000 mg | ORAL_TABLET | Freq: Once | ORAL | Status: DC | PRN

## 2011-07-27 MED ORDER — HYDROMORPHONE HCL 1 MG/ML IJ SOLN
INTRAMUSCULAR | Status: AC
  Filled 2011-07-27: qty 1

## 2011-07-27 MED ORDER — KETOROLAC TROMETHAMINE 30 MG/ML IJ SOLN
30.0000 mg | Freq: Four times a day (QID) | INTRAMUSCULAR | Status: DC
  Administered 2011-07-27 – 2011-07-28 (×2): 30 mg via INTRAVENOUS
  Filled 2011-07-27 (×2): qty 1

## 2011-07-27 MED ORDER — SODIUM CHLORIDE 0.9 % IJ SOLN: 9.0000 mL | INTRAMUSCULAR | Status: DC | PRN

## 2011-07-27 MED ORDER — SODIUM CHLORIDE 0.9 % IR SOLN
Status: DC | PRN
  Administered 2011-07-27: 1

## 2011-07-27 MED ORDER — ESTRADIOL 0.1 MG/GM VA CREA
TOPICAL_CREAM | VAGINAL | Status: DC | PRN
  Administered 2011-07-27: 1 via VAGINAL

## 2011-07-27 MED ORDER — LACTATED RINGERS IV SOLN
INTRAVENOUS | Status: DC | PRN
  Administered 2011-07-27 (×3): via INTRAVENOUS

## 2011-07-27 SURGICAL SUPPLY — 19 items
CLOTH BEACON ORANGE TIMEOUT ST (SAFETY) ×2 IMPLANT
ELECT LIGASURE LONG (ELECTRODE) IMPLANT
ELECT LIGASURE SHORT 9 REUSE (ELECTRODE) IMPLANT
GAUZE PACKING 2X5 YD STERILE (GAUZE/BANDAGES/DRESSINGS) ×1 IMPLANT
GLOVE BIO SURGEON STRL SZ 6.5 (GLOVE) ×2 IMPLANT
GLOVE BIOGEL PI IND STRL 7.0 (GLOVE) ×2 IMPLANT
GLOVE BIOGEL PI INDICATOR 7.0 (GLOVE) ×2
GLOVE ECLIPSE 6.0 STRL STRAW (GLOVE) ×2 IMPLANT
GOWN PREVENTION PLUS LG XLONG (DISPOSABLE) ×8 IMPLANT
NS IRRIG 1000ML POUR BTL (IV SOLUTION) ×2 IMPLANT
PACK VAGINAL WOMENS (CUSTOM PROCEDURE TRAY) ×2 IMPLANT
SUT VIC AB 0 CT1 18XCR BRD8 (SUTURE) ×2 IMPLANT
SUT VIC AB 0 CT1 8-18 (SUTURE) ×4
SUT VIC AB 2-0 CT1 27 (SUTURE) ×4
SUT VIC AB 2-0 CT1 TAPERPNT 27 (SUTURE) ×2 IMPLANT
SUT VICRYL 0 TIES 12 18 (SUTURE) ×2 IMPLANT
TOWEL OR 17X24 6PK STRL BLUE (TOWEL DISPOSABLE) ×4 IMPLANT
TRAY FOLEY CATH 14FR (SET/KITS/TRAYS/PACK) ×2 IMPLANT
WATER STERILE IRR 1000ML POUR (IV SOLUTION) ×1 IMPLANT

## 2011-07-27 NOTE — Op Note (Signed)
Hysterectomy Procedure Note  Indications: Menorrhagia  Pre-operative Diagnosis: Menorrhagia  Post-operative Diagnosis: same  Operation: Transvaginal hysterectomy, right oophorectomy Surgeon: Scheryl Darter   Assistants: Dr. Elsie Lincoln  Anesthesia: General endotracheal anesthesia  ASA Class: 3  Procedure Details   The patient gave written consent for transvaginal hysterectomy. Patient identification was confirmed and she was brought to the operating room. General anesthesia was induced. The patient was placed in dorsal lithotomy position. Perineum and vagina were sterilely prepped and draped and a Foley catheter was placed. Exam under anesthesia revealed normal-size uterus and no adnexal masses. A weighted speculum was inserted. The cervix was exposed using Deaver retractors. Double-tooth tenacula were placed on the cervix. 1% lidocaine with 1 100,000 epinephrine was infiltrated for an intracervical block. An #10 blade was used to make a incision around the cervix. The vaginal mucosa was pushed back from the cervix. The posterior cul-de-sac was entered at the midline with curved Mayo scissors. A 0 Vicryl suture was placed through the peritoneum and the vaginal mucosa. A Heaney clamp was placed across the uterosacral ligament on the right side and this was cut and suture ligated with 0 Vicryl. Same was done on the left side. The Heaney clamp was used to clamp and the right cardinal ligament was clamped and cut. Same was done on the left side using 0 Vicryl suture. The anterior peritoneum was entered with Metzenbaum scissors. The uterine cells were clamped and cut and suture ligated with 0 Vicryl. The adnexal pedicles were clamped and cut and suture ligated. Double ligatures were placed on the adnexal pedicles. The right adnexal pedicle was inspected and an extra suture was placed for hemostasis. There was some bleeding along the edge of the ovary. I elected to excise the ovary to help with  hemostasis. 0 Vicryl suture was placed and good hemostasis was seen. Both adnexal pedicles showed good hemostasis. 2-0 Vicryl suture was used in a running locking stitch to close the vaginal cuff. Hemostasis was assured with interrupted suture the midpoint of the vaginal cuff. A packing with 2 inch tape and Estrace cream was placed. She tolerated the procedure well without complications. Blood loss was 350 mL. She was brought to the recovery room in stable condition.   Findings: Normal sized uterus   Estimated Blood Loss:          Drains: Foley                Specimens: uterus and right ovary                Complications:  None; patient tolerated the procedure well.         Disposition: PACU - hemodynamically stable.         Condition: stable  Attending Attestation: I was present and scrubbed for the entire procedure.

## 2011-07-27 NOTE — Transfer of Care (Signed)
Immediate Anesthesia Transfer of Care Note  Patient: Erin [name]  Procedure(s) Performed:  HYSTERECTOMY VAGINAL - Vaginal Hysterectomy with Right Oophorectomy  Patient Location: PACU  Anesthesia Type: General  Level of Consciousness: awake, alert  and oriented  Airway & Oxygen Therapy: Patient Spontanous Breathing and Patient connected to nasal cannula oxygen  Post-op Assessment: Report given to PACU RN and Post -op Vital signs reviewed and stable  Post vital signs: Reviewed  Complications: No apparent anesthesia complications

## 2011-07-27 NOTE — H&P (Signed)
45yo wf [id number] with menorrhagia an uterine polyp, scheduled for transvaginal hysterectomy, see H&P 06/24/11 in Gyn clinic.She gives written consent today, no change in planned procedure, risks discussed.

## 2011-07-27 NOTE — Anesthesia Procedure Notes (Addendum)
Procedure Name: Intubation Performed by: Suella Grove Pre-anesthesia Checklist: Suction available, Emergency Drugs available, Timeout performed, Patient identified and Patient being monitored Patient Re-evaluated:Patient Re-evaluated prior to inductionOxygen Delivery Method: Circle System Utilized Preoxygenation: Pre-oxygenation with 100% oxygen Intubation Type: IV induction and Inhalational induction Ventilation: Mask ventilation without difficulty Grade View: Grade II Tube size: 7.0 mm Number of attempts: 1 Airway Equipment and Method: video-laryngoscopy Placement Confirmation: ETT inserted through vocal cords under direct vision,  breath sounds checked- equal and bilateral and positive ETCO2 Secured at: 21 cm Tube secured with: Tape Dental Injury: Teeth and Oropharynx as per pre-operative assessment     Date/Time: 07/27/2011 2:13 PM Performed by: Suella Grove

## 2011-07-27 NOTE — Anesthesia Postprocedure Evaluation (Signed)
  Anesthesia Post-op Note  Patient: Erin [name]  Procedure(s) Performed:  HYSTERECTOMY VAGINAL -  Patient is awake and responsive. Pain and nausea are reasonably well controlled. Vital signs are stable and clinically acceptable. Oxygen saturation is clinically acceptable. There are no apparent anesthetic complications at this time. Patient is ready for discharge.
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
## 2011-07-28 ENCOUNTER — Encounter (HOSPITAL_COMMUNITY): Payer: Self-pay | Admitting: Anesthesiology

## 2011-07-28 ENCOUNTER — Encounter (HOSPITAL_COMMUNITY): Payer: Self-pay | Admitting: Obstetrics & Gynecology

## 2011-07-28 ENCOUNTER — Inpatient Hospital Stay (HOSPITAL_COMMUNITY): Payer: Medicare Other

## 2011-07-28 ENCOUNTER — Inpatient Hospital Stay (HOSPITAL_COMMUNITY): Payer: Medicare Other | Admitting: Anesthesiology

## 2011-07-28 ENCOUNTER — Inpatient Hospital Stay (HOSPITAL_COMMUNITY)
Admission: AD | Admit: 2011-07-28 | Discharge: 2011-08-04 | DRG: 742 | Disposition: A | Discharge status: Home / Self Care | Payer: Medicare Other | Source: Other Acute Inpatient Hospital | Attending: Internal Medicine | Admitting: Internal Medicine

## 2011-07-28 ENCOUNTER — Encounter: Payer: Self-pay | Admitting: *Deleted

## 2011-07-28 ENCOUNTER — Other Ambulatory Visit: Payer: Self-pay

## 2011-07-28 ENCOUNTER — Encounter (HOSPITAL_COMMUNITY)
Admission: RE | Disposition: A | Discharge status: Still In Hospital | Payer: Self-pay | Source: Ambulatory Visit | Attending: Obstetrics & Gynecology

## 2011-07-28 HISTORY — PX: LAPAROTOMY: SHX154

## 2011-07-28 LAB — BASIC METABOLIC PANEL
BUN: 16 mg/dL (ref 6–23)
CO2: 21 mEq/L (ref 19–32)
Calcium: 7.4 mg/dL — ABNORMAL LOW (ref 8.4–10.5)
Chloride: 96 mEq/L (ref 96–112)
Creatinine, Ser: 1.22 mg/dL — ABNORMAL HIGH (ref 0.50–1.10)

## 2011-07-28 LAB — COMPREHENSIVE METABOLIC PANEL
ALT: 21 U/L (ref 0–35)
AST: 35 U/L (ref 0–37)
Albumin: 3.6 g/dL (ref 3.5–5.2)
BUN: 18 mg/dL (ref 6–23)
CO2: 17 mEq/L — ABNORMAL LOW (ref 19–32)
Calcium: 8.6 mg/dL (ref 8.4–10.5)
Calcium: 7.4 mg/dL — ABNORMAL LOW (ref 8.4–10.5)
Chloride: 94 mEq/L — ABNORMAL LOW (ref 96–112)
Creatinine, Ser: 1.88 mg/dL — ABNORMAL HIGH (ref 0.50–1.10)
Creatinine, Ser: 1.66 mg/dL — ABNORMAL HIGH (ref 0.50–1.10)
GFR calc Af Amer: 35 mL/min — ABNORMAL LOW (ref >60)
GFR calc non Af Amer: 29 mL/min — ABNORMAL LOW (ref >60)
Glucose, Bld: 325 mg/dL — ABNORMAL HIGH (ref 70–99)
Sodium: 131 mEq/L — ABNORMAL LOW (ref 135–145)
Total Bilirubin: 1 mg/dL (ref 0.3–1.2)
Total Bilirubin: 0.8 mg/dL (ref 0.3–1.2)
Total Protein: 7.1 g/dL (ref 6.0–8.3)

## 2011-07-28 LAB — CBC
HCT: 27.8 % — ABNORMAL LOW (ref 36.0–46.0)
HCT: 28.3 % — ABNORMAL LOW (ref 36.0–46.0)
Hemoglobin: 6.6 g/dL — CL (ref 12.0–15.0)
MCH: 26.6 pg (ref 26.0–34.0)
MCH: 28.6 pg (ref 26.0–34.0)
MCH: 28.8 pg (ref 26.0–34.0)
MCHC: 32.6 g/dL (ref 30.0–36.0)
MCHC: 33.2 g/dL (ref 30.0–36.0)
MCV: 87.5 fL (ref 78.0–100.0)
MCV: 86.3 fL (ref 78.0–100.0)
MCV: 86.8 fL (ref 78.0–100.0)
Platelets: 87 10*3/uL — ABNORMAL LOW (ref 150–400)
Platelets: 98 10*3/uL — ABNORMAL LOW (ref 150–400)
Platelets: 108 10*3/uL — ABNORMAL LOW (ref 150–400)
RBC: 2.48 MIL/uL — ABNORMAL LOW (ref 3.87–5.11)
RBC: 3.22 MIL/uL — ABNORMAL LOW (ref 3.87–5.11)
RDW: 20.2 % — ABNORMAL HIGH (ref 11.5–15.5)
RDW: 20.4 % — ABNORMAL HIGH (ref 11.5–15.5)
WBC: 7 10*3/uL (ref 4.0–10.5)
WBC: 9.6 10*3/uL (ref 4.0–10.5)

## 2011-07-28 LAB — PRO B NATRIURETIC PEPTIDE: Pro B Natriuretic peptide (BNP): 456.6 pg/mL — ABNORMAL HIGH (ref 0–125)

## 2011-07-28 SURGERY — LAPAROTOMY, EXPLORATORY
Anesthesia: General | Site: Abdomen | Wound class: Clean

## 2011-07-28 MED ORDER — ROCURONIUM BROMIDE 100 MG/10ML IV SOLN
INTRAVENOUS | Status: DC | PRN
  Administered 2011-07-28: 40 mg via INTRAVENOUS

## 2011-07-28 MED ORDER — ACETAMINOPHEN 325 MG PO TABS: 325.0000 mg | ORAL_TABLET | ORAL | Status: DC | PRN

## 2011-07-28 MED ORDER — GLYCOPYRROLATE 0.2 MG/ML IJ SOLN
INTRAMUSCULAR | Status: DC | PRN
  Administered 2011-07-28: 0.3 mg via INTRAVENOUS

## 2011-07-28 MED ORDER — NEOSTIGMINE METHYLSULFATE 1 MG/ML IJ SOLN
INTRAMUSCULAR | Status: DC | PRN
  Administered 2011-07-28: 3 mg via INTRAMUSCULAR

## 2011-07-28 MED ORDER — KETAMINE HCL 100 MG/ML IJ SOLN
INTRAMUSCULAR | Status: AC
  Filled 2011-07-28: qty 1

## 2011-07-28 MED ORDER — FUROSEMIDE 10 MG/ML IJ SOLN
40.0000 mg | Freq: Once | INTRAMUSCULAR | Status: AC
  Administered 2011-07-28: 40 mg via INTRAVENOUS
  Filled 2011-07-28: qty 4

## 2011-07-28 MED ORDER — FUROSEMIDE 10 MG/ML IJ SOLN
20.0000 mg | Freq: Once | INTRAMUSCULAR | Status: AC
  Administered 2011-07-28: 20 mg via INTRAVENOUS
  Filled 2011-07-28: qty 2

## 2011-07-28 MED ORDER — KETAMINE HCL 100 MG/ML IJ SOLN
INTRAMUSCULAR | Status: DC | PRN
  Administered 2011-07-28: 100 mg via INTRAVENOUS

## 2011-07-28 MED ORDER — GLYCOPYRROLATE 0.2 MG/ML IJ SOLN
INTRAMUSCULAR | Status: AC
  Filled 2011-07-28: qty 1

## 2011-07-28 MED ORDER — FENTANYL CITRATE 0.05 MG/ML IJ SOLN
INTRAMUSCULAR | Status: DC | PRN
  Administered 2011-07-28 (×3): 50 ug via INTRAVENOUS

## 2011-07-28 MED ORDER — LIDOCAINE HCL (CARDIAC) 20 MG/ML IV SOLN
INTRAVENOUS | Status: DC | PRN
  Administered 2011-07-28: 50 mg via INTRAVENOUS

## 2011-07-28 MED ORDER — DEXAMETHASONE SODIUM PHOSPHATE 4 MG/ML IJ SOLN
INTRAMUSCULAR | Status: DC | PRN
  Administered 2011-07-28: 10 mg via INTRAVENOUS

## 2011-07-28 MED ORDER — ONDANSETRON HCL 4 MG/2ML IJ SOLN: INTRAMUSCULAR | Status: DC | PRN

## 2011-07-28 MED ORDER — PROPOFOL 10 MG/ML IV EMUL
INTRAVENOUS | Status: AC
  Filled 2011-07-28: qty 20

## 2011-07-28 MED ORDER — SODIUM CHLORIDE 0.9 % IV SOLN
INTRAVENOUS | Status: DC | PRN
  Administered 2011-07-28 (×2): via INTRAVENOUS

## 2011-07-28 MED ORDER — ROCURONIUM BROMIDE 50 MG/5ML IV SOLN
INTRAVENOUS | Status: AC
  Filled 2011-07-28: qty 1

## 2011-07-28 MED ORDER — FUROSEMIDE 10 MG/ML IJ SOLN
160.0000 mg | Freq: Once | INTRAVENOUS | Status: AC
  Administered 2011-07-28: 160 mg via INTRAVENOUS
  Filled 2011-07-28: qty 16

## 2011-07-28 MED ORDER — CEFAZOLIN SODIUM 1 G IJ SOLR
INTRAMUSCULAR | Status: DC | PRN
  Administered 2011-07-28: 2 g via INTRAMUSCULAR

## 2011-07-28 MED ORDER — FENTANYL CITRATE 0.05 MG/ML IJ SOLN: 25.0000 ug | INTRAMUSCULAR | Status: DC | PRN

## 2011-07-28 MED ORDER — ALBUTEROL SULFATE (5 MG/ML) 0.5% IN NEBU
2.5000 mg | INHALATION_SOLUTION | RESPIRATORY_TRACT | Status: AC
  Administered 2011-07-28: 2.5 mg via RESPIRATORY_TRACT
  Filled 2011-07-28: qty 20

## 2011-07-28 MED ORDER — FENTANYL CITRATE 0.05 MG/ML IJ SOLN
INTRAMUSCULAR | Status: AC
Start: 2011-07-28 — End: 2011-07-28
  Filled 2011-07-28: qty 5

## 2011-07-28 MED ORDER — LIDOCAINE HCL (CARDIAC) 20 MG/ML IV SOLN
INTRAVENOUS | Status: AC
  Filled 2011-07-28: qty 5

## 2011-07-28 MED ORDER — HYDROMORPHONE 0.3 MG/ML IV SOLN
INTRAVENOUS | Status: AC
  Filled 2011-07-28: qty 25

## 2011-07-28 MED ORDER — HYDROMORPHONE 0.3 MG/ML IV SOLN
INTRAVENOUS | Status: DC
  Administered 2011-07-28: 09:00:00 via INTRAVENOUS
  Administered 2011-07-28: 1.2 mg via INTRAVENOUS
  Administered 2011-07-28: 0.6 mg via INTRAVENOUS
  Administered 2011-07-28: 2.7 mg via INTRAVENOUS

## 2011-07-28 MED ORDER — DEXAMETHASONE SODIUM PHOSPHATE 10 MG/ML IJ SOLN
INTRAMUSCULAR | Status: AC
  Filled 2011-07-28: qty 1

## 2011-07-28 MED ORDER — MIDAZOLAM HCL 5 MG/5ML IJ SOLN
INTRAMUSCULAR | Status: DC | PRN
  Administered 2011-07-28 (×2): 1 mg via INTRAVENOUS

## 2011-07-28 MED ORDER — MEPERIDINE HCL 25 MG/ML IJ SOLN: 6.2500 mg | INTRAMUSCULAR | Status: DC | PRN

## 2011-07-28 MED ORDER — SODIUM CHLORIDE 0.45 % IV SOLN
INTRAVENOUS | Status: DC
  Administered 2011-07-28: 09:00:00 via INTRAVENOUS

## 2011-07-28 MED ORDER — CEFAZOLIN SODIUM 1-5 GM-% IV SOLN
INTRAVENOUS | Status: AC
  Filled 2011-07-28: qty 100

## 2011-07-28 MED ORDER — GLYCOPYRROLATE 0.2 MG/ML IJ SOLN
0.6000 mg | Freq: Once | INTRAMUSCULAR | Status: DC
  Filled 2011-07-28: qty 3

## 2011-07-28 MED ORDER — PROPOFOL 10 MG/ML IV EMUL
INTRAVENOUS | Status: DC | PRN
  Administered 2011-07-28: 50 mg via INTRAVENOUS

## 2011-07-28 MED ORDER — NEOSTIGMINE METHYLSULFATE 1 MG/ML IJ SOLN
INTRAMUSCULAR | Status: AC
  Filled 2011-07-28: qty 10

## 2011-07-28 MED ORDER — SUCCINYLCHOLINE CHLORIDE 20 MG/ML IJ SOLN
INTRAMUSCULAR | Status: DC | PRN
  Administered 2011-07-28: 140 mg via INTRAVENOUS

## 2011-07-28 MED ORDER — EPHEDRINE SULFATE 50 MG/ML IJ SOLN
INTRAMUSCULAR | Status: DC | PRN
  Administered 2011-07-28 (×2): 5 mg via INTRAVENOUS
  Administered 2011-07-28: 10 mg via INTRAVENOUS

## 2011-07-28 MED ORDER — EPHEDRINE 5 MG/ML INJ
INTRAVENOUS | Status: AC
  Filled 2011-07-28: qty 10

## 2011-07-28 MED ORDER — MIDAZOLAM HCL 2 MG/2ML IJ SOLN
INTRAMUSCULAR | Status: AC
  Filled 2011-07-28: qty 2

## 2011-07-28 SURGICAL SUPPLY — 35 items
CANISTER SUCTION 2500CC (MISCELLANEOUS) ×2 IMPLANT
CLOTH BEACON ORANGE TIMEOUT ST (SAFETY) ×2 IMPLANT
CONT PATH 16OZ SNAP LID 3702 (MISCELLANEOUS) ×2 IMPLANT
DRESSING TELFA 8X3 (GAUZE/BANDAGES/DRESSINGS) ×2 IMPLANT
DRSG COVADERM 4X8 (GAUZE/BANDAGES/DRESSINGS) ×1 IMPLANT
GAUZE SPONGE 4X4 16PLY XRAY LF (GAUZE/BANDAGES/DRESSINGS) ×2 IMPLANT
GLOVE BIOGEL PI IND STRL 9 (GLOVE) ×2 IMPLANT
GLOVE BIOGEL PI INDICATOR 9 (GLOVE) ×2
GLOVE ECLIPSE 9.0 STRL (GLOVE) ×2 IMPLANT
GOWN PREVENTION PLUS LG XLONG (DISPOSABLE) ×3 IMPLANT
GOWN STRL REIN 3XL LVL4 (GOWN DISPOSABLE) ×2 IMPLANT
NS IRRIG 1000ML POUR BTL (IV SOLUTION) ×2 IMPLANT
PACK ABDOMINAL GYN (CUSTOM PROCEDURE TRAY) ×2 IMPLANT
PAD OB MATERNITY 4.3X12.25 (PERSONAL CARE ITEMS) ×2 IMPLANT
RETRACTOR WND ALEXIS 25 LRG (MISCELLANEOUS) IMPLANT
RTRCTR WOUND ALEXIS 25CM LRG (MISCELLANEOUS)
SPONGE LAP 18X18 X RAY DECT (DISPOSABLE) ×4 IMPLANT
STAPLER VISISTAT 35W (STAPLE) ×2 IMPLANT
SUT CHROMIC 0 CT 1 (SUTURE) IMPLANT
SUT CHROMIC 2 0 CT 1 (SUTURE) IMPLANT
SUT CHROMIC 2 0 SH (SUTURE) IMPLANT
SUT CHROMIC GUT AB #0 18 (SUTURE) IMPLANT
SUT PLAIN 2 0 (SUTURE)
SUT PLAIN ABS 2-0 CT1 27XMFL (SUTURE) IMPLANT
SUT PROLENE 0 CT 1 30 (SUTURE) ×1 IMPLANT
SUT VIC AB 0 CT1 27 (SUTURE) ×4
SUT VIC AB 0 CT1 27XBRD ANBCTR (SUTURE) ×2 IMPLANT
SUT VIC AB 2-0 CT1 27 (SUTURE) ×12
SUT VIC AB 2-0 CT1 TAPERPNT 27 (SUTURE) IMPLANT
SUT VIC AB 3-0 PS2 18 (SUTURE) ×1 IMPLANT
SUT VICRYL 2 0 18  UND BR (SUTURE) ×1
SUT VICRYL 2 0 18 UND BR (SUTURE) IMPLANT
TOWEL OR 17X24 6PK STRL BLUE (TOWEL DISPOSABLE) ×6 IMPLANT
TRAY FOLEY CATH 14FR (SET/KITS/TRAYS/PACK) ×1 IMPLANT
WATER STERILE IRR 1000ML POUR (IV SOLUTION) ×2 IMPLANT

## 2011-07-28 NOTE — Op Note (Signed)
Procedure: Exploratory laparotomy and ligature of bleeding vaginal cuff Preop diagnosis: Postoperative bleeding status post transvaginal hysterectomy Postop diagnosis: Same Surgeon: Dr. Scheryl Darter Assistant: Dr. Christin Bach Anesthesia: Gen. by Dr. Cristela Blue Estimated blood loss: 100 mL Findings: Estimated 2000 mL of hemoperitoneum, bleeding from the vaginal cuff Fluids: 3000 mL of crystalloid and one unit of packed red blood cells intraoperatively with a total of 4 units transfused Drains Foley catheter Complications: None Counts: Correct  The patient is status post vaginal hysterectomy and right oophorectomy on 07/27/2011. Patient exhibited some hemodynamic instability and abdominal distention consistent with intra-abdominal bleeding. She gave written consent  for exploratory laparotomy. Transfusion of 4 units of packed red blood cells was begun before she came to the operating room. She was brought to the operating room and placed in dorsal lithotomy position. General anesthesia was induced. Abdomen and perineum and vagina were sterilely prepped and draped. A Foley catheter was in place. A #10 blade was used to make a vertical skin incision from the symphysis pubis to the umbilicus. Incision was carried down to the fascia. Hemostasis was obtained with cautery. Fascia was incised and this was extended vertically. Peritoneal cavity was entered and the incision was extended vertically with an large amounts hemoperitoneum was found. The clots and blood were evacuated. Bowel was packed back with moist lap pads. An O'Connor-O'Sullivan retractor was placed. The pelvis was exposed. Both adnexa were inspected. A 0 Vicryl free tie was placed on the round ligament on the right side because of some possible oozing from that site. The vaginal cuff was inspected and appeared to be some oozing from the left side of the vaginal cuff. 2-0 Vicryl suture with interrupted stitches was placed. Good hemostasis was  seen. The left adnexa was hemostatic. The abdominal cavity was copiously irrigated. Adhesions of the omentum to the anterior abdominal wall were lysed using cautery and hemostasis was obtained with interrupted 2-0 Vicryl sutures. After excellent hemostasis was assured all instruments and packs were removed. Anterior peritoneum was closed with a running suture with 2-0 Vicryl. The fascia was closed with a running suture of 0 Prolene. Subcutaneous sutures with 2-0 Vicryl were placed in the Scarpa's fascia. The skin was closed with a running subcuticular suture with 3-0 Vicryl. Steri-Strips were placed. Patient tolerated the procedure well without complications. Estimated blood loss during the procedure was 100 mL with estimated 2000 mL of hemoperitoneum. She received 3000 mL of crystalloid and a total of 4 units of packed red blood cells. She had good urine output. She is brought in stable condition to the recovery room.

## 2011-07-28 NOTE — Progress Notes (Signed)
07/28/2011  6:31 AM  PATIENT:  Erin [name]  45 y.o. female  PRE-OPERATIVE DIAGNOSIS:  Post operative hemorrhage  POST-OPERATIVE DIAGNOSIS:  Post Operative Hemorrhage  PROCEDURE:  Procedure(s): EXPLORATORY LAPAROTOMY  SURGEON:  Surgeon(s): Tilda Burrow, MD Scheryl Darter, MD  PHYSICIAN ASSISTANT:   ASSISTANTS: none   ANESTHESIA:   general  ESTIMATED BLOOD LOSS: * No blood loss amount entered *   BLOOD ADMINISTERED:4 units prbc CC PRBC  DRAINS: Urinary Catheter (Foley)   LOCAL MEDICATIONS USED:  NONE  SPECIMEN:  No Specimen  DISPOSITION OF SPECIMEN:  N/A  COUNTS:  YES  TOURNIQUET:  * No tourniquets in log *  DICTATION #: by dr Debroah Loop  PLAN OF CARE: to ICU  PATIENT DISPOSITION:  PACU - hemodynamically stable.   Delay start of Pharmacological VTE agent (>24hrs) due to surgical blood loss or risk of bleeding:  yes

## 2011-07-28 NOTE — Progress Notes (Signed)
Dr. Jearld Lesch notified of change in pt's condition. Pt. Lethargic, diaphoretic, tachycardic, audible wheezing and upper airway congestion. Dr. Jearld Lesch arrived at bedside and ordered Lasix 40mg  IV which was given, bedside EKG done and bedside abd. Ultrasound ordered. Resp arrived to give resp. Treatment and Dr. Delford Field in to see pt. Plan is for pt. To be moved to [facility] hospital and all disciplines collaborating to d/c and transfer pt.

## 2011-07-28 NOTE — Progress Notes (Signed)
Called Pt's stepfather and attempted to speak with pt's mother for update to given. Pt 's stepfather stated she was still asleep.

## 2011-07-28 NOTE — Transfer of Care (Signed)
Immediate Anesthesia Transfer of Care Note  Patient: Erin [name]  Procedure(s) Performed:  EXPLORATORY LAPAROTOMY  Patient Location: PACU  Anesthesia Type: General  Level of Consciousness: sedated and patient cooperative  Airway & Oxygen Therapy: Patient Spontanous Breathing and Patient connected to nasal cannula oxygen  Post-op Assessment: Report given to PACU RN and Post -op Vital signs reviewed and stable  Post vital signs: Reviewed and stable  Complications: No apparent anesthesia complications

## 2011-07-28 NOTE — Progress Notes (Signed)
Pt desires that we call her mother and not her daughter. Called hm # [phone], spoke with father in law who says he will tell her and to call back in the morning. Pt aware of phone call.

## 2011-07-28 NOTE — Progress Notes (Signed)
Called to see pt due to hypotension, tachycardia to 130 and abdominal distention.  Patient simultaneously transferred to [facility] for increased monitoring.  Stat HGb 6.6 with cmet and coagulation profile pending. And ultrasound at bedside shows multiple pockets of fluid/blood throughout the abdomen.  Patient will need exploration to identify source of continued bleeding.  OR notified. Dr Debroah Loop identified. Anesthesia aware.  2 units A pos released to pt under MTP. Crossmatched for 4 units.  To OR promptly.

## 2011-07-28 NOTE — Progress Notes (Signed)
UR chart review completed.  

## 2011-07-28 NOTE — Anesthesia Postprocedure Evaluation (Signed)
  Anesthesia Post-op Note  Patient: Erin [name]  Procedure(s) Performed:  EXPLORATORY LAPAROTOMY  Patient Location: PACU  Anesthesia Type: General  Level of Consciousness: awake, alert  and oriented  Airway and Oxygen Therapy: Patient Spontanous Breathing and Patient connected to nasal cannula oxygen  Post-op Pain: mild  Post-op Assessment: Post-op Vital signs reviewed, Patient's Cardiovascular Status Stable, Respiratory Function Stable, Patent Airway, No signs of Nausea or vomiting and Pain level controlled  Post-op Vital Signs: Reviewed and stable  Complications: No apparent anesthesia complications

## 2011-07-28 NOTE — Anesthesia Preprocedure Evaluation (Addendum)
Anesthesia Evaluation  Name, MR# and DOB Patient awake  General Assessment Comment  Reviewed: Allergy & Precautions, H&P , Patient's Chart, lab work & pertinent test results and reviewed documented beta blocker date and time   Airway Mallampati: III TM Distance: >3 FB Neck ROM: full    Dental No notable dental hx    Pulmonary  clear to auscultation  pulmonary exam normal   Cardiovascular regular Normal   Neuro/Psych  GI/Hepatic/Renal   Endo/Other   Abdominal   Musculoskeletal  Hematology   Peds  Reproductive/Obstetrics   Anesthesia Other Findings See preop from yestersday at 1300            Anesthesia Physical Anesthesia Plan  ASA: III  Anesthesia Plan: General   Post-op Pain Management:    Induction: Intravenous  Airway Management Planned: Oral ETT  Additional Equipment:   Intra-op Plan:   Post-operative Plan:   Informed Consent: I have reviewed the patients History and Physical, chart, labs and discussed the procedure including the risks, benefits and alternatives for the proposed anesthesia with the patient or authorized representative who has indicated his/her understanding and acceptance.   Dental Advisory Given  Plan Discussed with: CRNA and Surgeon  Anesthesia Plan Comments: (  See prev pre-op. Patient now bleeding and getting PRBC from ICU. Will give Robinal .6mg  preop. I expect a more challangeing airway since 1pm, when we needed glide and it was swollen. Ketamine/propofol induction. Low Plts noted still.  Discussed with JA and JF...... MD's.)        Anesthesia Quick Evaluation

## 2011-07-28 NOTE — Progress Notes (Signed)
Subjective: Patient reports incisional pain.  No reports of SOB but appears diaphoretic and not responding well.  Dr. Delford Field at bedside.  Objective: I have reviewed patient's vital signs, intake and output, medications and labs.  General: distracted, fatigued and moderate distress Resp: rhonchi diffuse and wheezes diffuse Cardio: regular rate and rhythm GI: abnormal findings:  absent bowel sounds and distended Extremities: Homans sign is negative, no sign of DVT Vaginal Bleeding: none   Assessment/Plan: POD #1 s/p TVH and e lap for hemoperitoneum from cuff bleed.  Pt has become aneuric and having respiratory distress. Critical Care consult at bedside.  Pt to be transferred to [facility] ICU for management of medical problems.  Pt will need central line and renal consultation.  Bedside US being done to eval for ascites and hydroureter.   LOS: 1 day    Burlon Centrella H. 07/28/2011, 8:43 PM

## 2011-07-28 NOTE — Telephone Encounter (Signed)
This encounter was created in error - please disregard.

## 2011-07-28 NOTE — Anesthesia Procedure Notes (Signed)
Procedure Name: Intubation Date/Time: 07/28/2011 4:19 AM Performed by: Raechel Chute, Johann Santone Pre-anesthesia Checklist: Patient identified, Patient being monitored, Emergency Drugs available, Timeout performed and Suction available Patient Re-evaluated:Patient Re-evaluated prior to inductionOxygen Delivery Method: Circle System Utilized and Simple face mask Preoxygenation: Pre-oxygenation with 100% oxygen Intubation Type: IV induction Ventilation: Mask ventilation without difficulty Number of attempts: 1 Airway Equipment and Method: video-laryngoscopy Placement Confirmation: ETT inserted through vocal cords under direct vision,  positive ETCO2 and breath sounds checked- equal and bilateral Secured at: 21 cm Tube secured with: Tape Dental Injury: Teeth and Oropharynx as per pre-operative assessment  Difficulty Due To: Difficulty was anticipated

## 2011-07-28 NOTE — Progress Notes (Signed)
Day of Surgery Procedure(s): EXPLORATORY LAPAROTOMY, TVH r ooph  Subjective: Patient reports tolerating PO.  Short of breath for 1 hr, slight cough, no CP  Objective: I have reviewed patient's vital signs, intake and output, medications and labs.  General: alert Resp: rales posterior - bilateral and rhonchi posterior - bilateral Cardio: regular rate and rhythm, S1, S2 normal, no murmur, click, rub or gallop GI: soft, non-tender; bowel sounds normal; no masses,  no organomegaly Extremities: extremities normal, atraumatic, no cyanosis or edema  Assessment: s/p Procedure(s): EXPLORATORY LAPAROTOMY: POD 0 expl lap, s/p transfusion, possible fluid overload, pulm edema.  Plan: CXR, CBC, BNP, CMP, Lasix 20 mg IV,  observe  LOS: 1 day    Erin [name] 07/28/2011, 5:47 PM

## 2011-07-29 ENCOUNTER — Inpatient Hospital Stay (HOSPITAL_COMMUNITY): Payer: Medicare Other

## 2011-07-29 DIAGNOSIS — N179 Acute kidney failure, unspecified: Secondary | ICD-10-CM

## 2011-07-29 DIAGNOSIS — D62 Acute posthemorrhagic anemia: Secondary | ICD-10-CM

## 2011-07-29 LAB — BASIC METABOLIC PANEL
BUN: 20 mg/dL (ref 6–23)
BUN: 18 mg/dL (ref 6–23)
CO2: 25 mEq/L (ref 19–32)
Calcium: 7.9 mg/dL — ABNORMAL LOW (ref 8.4–10.5)
Calcium: 7.9 mg/dL — ABNORMAL LOW (ref 8.4–10.5)
Calcium: 7.7 mg/dL — ABNORMAL LOW (ref 8.4–10.5)
Creatinine, Ser: 1.74 mg/dL — ABNORMAL HIGH (ref 0.50–1.10)
GFR calc Af Amer: 47 mL/min — ABNORMAL LOW (ref >60)
GFR calc non Af Amer: 32 mL/min — ABNORMAL LOW (ref >60)
GFR calc non Af Amer: 39 mL/min — ABNORMAL LOW (ref >60)
GFR calc non Af Amer: 44 mL/min — ABNORMAL LOW (ref >60)
Glucose, Bld: 136 mg/dL — ABNORMAL HIGH (ref 70–99)
Glucose, Bld: 152 mg/dL — ABNORMAL HIGH (ref 70–99)
Glucose, Bld: 139 mg/dL — ABNORMAL HIGH (ref 70–99)
Sodium: 134 mEq/L — ABNORMAL LOW (ref 135–145)

## 2011-07-29 LAB — CBC
HCT: 28 % — ABNORMAL LOW (ref 36.0–46.0)
HCT: 25.1 % — ABNORMAL LOW (ref 36.0–46.0)
Hemoglobin: 8.7 g/dL — ABNORMAL LOW (ref 12.0–15.0)
MCH: 29.3 pg (ref 26.0–34.0)
MCH: 29.1 pg (ref 26.0–34.0)
MCH: 28.3 pg (ref 26.0–34.0)
MCHC: 34.7 g/dL (ref 30.0–36.0)
MCHC: 34.2 g/dL (ref 30.0–36.0)
MCHC: 32.9 g/dL (ref 30.0–36.0)
MCV: 84.5 fL (ref 78.0–100.0)
Platelets: 85 10*3/uL — ABNORMAL LOW (ref 150–400)
Platelets: 91 10*3/uL — ABNORMAL LOW (ref 150–400)
RBC: 3.32 MIL/uL — ABNORMAL LOW (ref 3.87–5.11)
RBC: 2.82 MIL/uL — ABNORMAL LOW (ref 3.87–5.11)
RDW: 20.6 % — ABNORMAL HIGH (ref 11.5–15.5)
RDW: 20.7 % — ABNORMAL HIGH (ref 11.5–15.5)
WBC: 10.1 10*3/uL (ref 4.0–10.5)

## 2011-07-29 LAB — PHOSPHORUS: Phosphorus: 4.5 mg/dL (ref 2.3–4.6)

## 2011-07-29 LAB — TYPE AND SCREEN
ABO/RH(D): AB POS
Antibody Screen: NEGATIVE
Unit division: 0
Unit division: 0

## 2011-07-29 LAB — HEPATIC FUNCTION PANEL
Albumin: 3.5 g/dL (ref 3.5–5.2)
Alkaline Phosphatase: 78 U/L (ref 39–117)
Bilirubin, Direct: 0.2 mg/dL (ref 0.0–0.3)
Total Bilirubin: 0.5 mg/dL (ref 0.3–1.2)

## 2011-07-29 LAB — PROTIME-INR: INR: 1.16 (ref 0.00–1.49)

## 2011-07-29 NOTE — Progress Notes (Signed)
Pt. Transferred to [facility] before having chance to waist remaining PCA 2130 - 0.3mg  of Dilaudid had been received and after pt. Left 2.1mg  (7cc) was waisted and witnessed by Rosario Adie, pharmacy also notified. Unable to put  Put waist in to Pyxis since pt. Removed from [facility] system?

## 2011-07-30 LAB — BASIC METABOLIC PANEL
Chloride: 95 mEq/L — ABNORMAL LOW (ref 96–112)
Creatinine, Ser: 0.89 mg/dL (ref 0.50–1.10)
GFR calc Af Amer: >60 mL/min (ref >60)
Potassium: 3.3 mEq/L — ABNORMAL LOW (ref 3.5–5.1)
Sodium: 133 mEq/L — ABNORMAL LOW (ref 135–145)

## 2011-07-30 LAB — CBC
HCT: 25.1 % — ABNORMAL LOW (ref 36.0–46.0)
Hemoglobin: 8.3 g/dL — ABNORMAL LOW (ref 12.0–15.0)
Hemoglobin: 8 g/dL — ABNORMAL LOW (ref 12.0–15.0)
MCH: 28.6 pg (ref 26.0–34.0)
Platelets: 84 10*3/uL — ABNORMAL LOW (ref 150–400)
RBC: 2.8 MIL/uL — ABNORMAL LOW (ref 3.87–5.11)
RDW: 20.4 % — ABNORMAL HIGH (ref 11.5–15.5)
RDW: 20.3 % — ABNORMAL HIGH (ref 11.5–15.5)
WBC: 4.5 10*3/uL (ref 4.0–10.5)
WBC: 6 10*3/uL (ref 4.0–10.5)

## 2011-07-31 DIAGNOSIS — D62 Acute posthemorrhagic anemia: Secondary | ICD-10-CM

## 2011-07-31 DIAGNOSIS — N179 Acute kidney failure, unspecified: Secondary | ICD-10-CM

## 2011-07-31 LAB — CBC
HCT: 23 % — ABNORMAL LOW (ref 36.0–46.0)
HCT: 22.7 % — ABNORMAL LOW (ref 36.0–46.0)
MCH: 29.1 pg (ref 26.0–34.0)
MCHC: 33.9 g/dL (ref 30.0–36.0)
MCV: 86.5 fL (ref 78.0–100.0)
MCV: 85.7 fL (ref 78.0–100.0)
RDW: 20.2 % — ABNORMAL HIGH (ref 11.5–15.5)
RDW: 20.1 % — ABNORMAL HIGH (ref 11.5–15.5)
WBC: 2.9 10*3/uL — ABNORMAL LOW (ref 4.0–10.5)

## 2011-07-31 LAB — BASIC METABOLIC PANEL
BUN: 9 mg/dL (ref 6–23)
CO2: 34 mEq/L — ABNORMAL HIGH (ref 19–32)
Chloride: 93 mEq/L — ABNORMAL LOW (ref 96–112)
Creatinine, Ser: 0.54 mg/dL (ref 0.50–1.10)
GFR calc Af Amer: >60 mL/min (ref >60)
Glucose, Bld: 116 mg/dL — ABNORMAL HIGH (ref 70–99)

## 2011-08-01 ENCOUNTER — Inpatient Hospital Stay (HOSPITAL_COMMUNITY): Payer: Medicare Other

## 2011-08-01 LAB — BASIC METABOLIC PANEL
Calcium: 8.6 mg/dL (ref 8.4–10.5)
Creatinine, Ser: 0.6 mg/dL (ref 0.50–1.10)
GFR calc non Af Amer: >60 mL/min (ref >60)
Sodium: 135 mEq/L (ref 135–145)

## 2011-08-01 LAB — CBC
MCH: 28.9 pg (ref 26.0–34.0)
MCHC: 33.5 g/dL (ref 30.0–36.0)
Platelets: 125 10*3/uL — ABNORMAL LOW (ref 150–400)

## 2011-08-02 DIAGNOSIS — K766 Portal hypertension: Secondary | ICD-10-CM

## 2011-08-02 DIAGNOSIS — J9600 Acute respiratory failure, unspecified whether with hypoxia or hypercapnia: Secondary | ICD-10-CM

## 2011-08-02 DIAGNOSIS — N920 Excessive and frequent menstruation with regular cycle: Secondary | ICD-10-CM

## 2011-08-02 DIAGNOSIS — R578 Other shock: Secondary | ICD-10-CM

## 2011-08-02 LAB — CBC
MCH: 28.4 pg (ref 26.0–34.0)
Platelets: 142 10*3/uL — ABNORMAL LOW (ref 150–400)
RBC: 2.96 MIL/uL — ABNORMAL LOW (ref 3.87–5.11)
RDW: 19.9 % — ABNORMAL HIGH (ref 11.5–15.5)
WBC: 2.7 10*3/uL — ABNORMAL LOW (ref 4.0–10.5)

## 2011-08-03 LAB — BASIC METABOLIC PANEL
BUN: 10 mg/dL (ref 6–23)
CO2: 30 mEq/L (ref 19–32)
Calcium: 9 mg/dL (ref 8.4–10.5)
Chloride: 91 mEq/L — ABNORMAL LOW (ref 96–112)
Creatinine, Ser: 0.77 mg/dL (ref 0.50–1.10)
Glucose, Bld: 127 mg/dL — ABNORMAL HIGH (ref 70–99)

## 2011-08-03 LAB — CBC
MCHC: 31.9 g/dL (ref 30.0–36.0)
Platelets: 182 10*3/uL (ref 150–400)
RDW: 20.7 % — ABNORMAL HIGH (ref 11.5–15.5)

## 2011-08-04 LAB — CBC
HCT: 22.9 % — ABNORMAL LOW (ref 36.0–46.0)
MCH: 28.4 pg (ref 26.0–34.0)
MCV: 87.7 fL (ref 78.0–100.0)
Platelets: 128 10*3/uL — ABNORMAL LOW (ref 150–400)
RDW: 20.2 % — ABNORMAL HIGH (ref 11.5–15.5)

## 2011-08-04 LAB — BASIC METABOLIC PANEL
BUN: 10 mg/dL (ref 6–23)
Calcium: 8.6 mg/dL (ref 8.4–10.5)
Chloride: 98 mEq/L (ref 96–112)
Creatinine, Ser: 0.72 mg/dL (ref 0.50–1.10)
GFR calc Af Amer: >60 mL/min (ref >60)

## 2011-08-04 NOTE — Discharge Summary (Signed)
NAMEDWAYNA, KENTNER             ACCOUNT NO.:  [id number]  MEDICAL RECORD NO.:  [id number]  LOCATION:  9373                          FACILITY:  [facility]  PHYSICIAN:  Scheryl Darter, MD       DATE OF BIRTH:  1966/03/04  DATE OF ADMISSION:  07/27/2011 DATE OF DISCHARGE:  07/28/2011                              DISCHARGE SUMMARY   DIAGNOSES: 1. Menorrhagia. 2. Postoperative hemorrhage after transvaginal hysterectomy. 3. Pulmonary edema due to fluid overload and oliguria.  The patient is a 45 year old white female who was admitted on July 27, 2011, for transvaginal hysterectomy.  This was performed by Dr. Debroah Loop. A right oophorectomy was also performed.  Postoperatively, she had pelvic bleeding with hemoperitoneum.  On the morning of July 28, 2011, exploratory laparotomy was performed.  About 1500-2000 mL of hemoperitoneum was found and bleeding from the left-side of her vaginal cuff was noticed intraoperatively.  She received 4 units of packed red blood cells and about 4 liters of IV fluids.  Later on that day, she began to have respiratory distress.  She appeared to have developed pulmonary edema.  She was oliguric.  Critical care physician was consulted in the ICU.  She received IV Lasix with minimal response. Decision was made to transfer the patient to the intensive care unit at [facility].  She was transferred on the morning of July 29, 2011.  She was transferred to ICU, Unit 2300.     Scheryl Darter, MD     JA/MEDQ  D:  08/03/2011  T:  08/04/2011  Job:  [id number]
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
## 2011-08-05 LAB — CROSSMATCH
ABO/RH(D): AB POS
Unit division: 0

## 2011-08-17 ENCOUNTER — Emergency Department (HOSPITAL_COMMUNITY)
Admission: EM | Admit: 2011-08-17 | Discharge: 2011-08-17 | Disposition: A | Discharge status: Home / Self Care | Payer: Medicare Other | Attending: Emergency Medicine | Admitting: Emergency Medicine

## 2011-08-17 DIAGNOSIS — D869 Sarcoidosis, unspecified: Secondary | ICD-10-CM | POA: Insufficient documentation

## 2011-08-17 DIAGNOSIS — R109 Unspecified abdominal pain: Secondary | ICD-10-CM | POA: Insufficient documentation

## 2011-08-17 DIAGNOSIS — E669 Obesity, unspecified: Secondary | ICD-10-CM | POA: Insufficient documentation

## 2011-08-17 DIAGNOSIS — Z8659 Personal history of other mental and behavioral disorders: Secondary | ICD-10-CM | POA: Insufficient documentation

## 2011-08-17 DIAGNOSIS — Z79899 Other long term (current) drug therapy: Secondary | ICD-10-CM | POA: Insufficient documentation

## 2011-08-17 DIAGNOSIS — K7460 Unspecified cirrhosis of liver: Secondary | ICD-10-CM | POA: Insufficient documentation

## 2011-08-17 DIAGNOSIS — F3130 Bipolar disorder, current episode depressed, mild or moderate severity, unspecified: Secondary | ICD-10-CM | POA: Insufficient documentation

## 2011-08-18 ENCOUNTER — Ambulatory Visit (INDEPENDENT_AMBULATORY_CARE_PROVIDER_SITE_OTHER): Payer: Medicare Other | Admitting: *Deleted

## 2011-08-18 VITALS — BP 127/85 | HR 84 | Temp 97.0°F

## 2011-08-18 DIAGNOSIS — Z09 Encounter for follow-up examination after completed treatment for conditions other than malignant neoplasm: Secondary | ICD-10-CM

## 2011-08-18 DIAGNOSIS — Z4802 Encounter for removal of sutures: Secondary | ICD-10-CM

## 2011-08-18 NOTE — Progress Notes (Signed)
Staples removed with no problems. No signs or symptoms of infection. Patient to follow up in a couple of weeks with Dr. Debroah Loop.

## 2011-08-24 ENCOUNTER — Emergency Department (HOSPITAL_COMMUNITY)
Admission: EM | Admit: 2011-08-24 | Discharge: 2011-08-24 | Disposition: A | Discharge status: Home / Self Care | Payer: Medicare Other | Attending: Emergency Medicine | Admitting: Emergency Medicine

## 2011-08-24 ENCOUNTER — Emergency Department (HOSPITAL_COMMUNITY): Payer: Medicare Other

## 2011-08-24 DIAGNOSIS — R109 Unspecified abdominal pain: Secondary | ICD-10-CM | POA: Insufficient documentation

## 2011-08-24 DIAGNOSIS — T8140XA Infection following a procedure, unspecified, initial encounter: Secondary | ICD-10-CM | POA: Insufficient documentation

## 2011-08-24 DIAGNOSIS — Y849 Medical procedure, unspecified as the cause of abnormal reaction of the patient, or of later complication, without mention of misadventure at the time of the procedure: Secondary | ICD-10-CM | POA: Insufficient documentation

## 2011-08-24 DIAGNOSIS — F3130 Bipolar disorder, current episode depressed, mild or moderate severity, unspecified: Secondary | ICD-10-CM | POA: Insufficient documentation

## 2011-08-24 DIAGNOSIS — K7460 Unspecified cirrhosis of liver: Secondary | ICD-10-CM | POA: Insufficient documentation

## 2011-08-24 DIAGNOSIS — Z8659 Personal history of other mental and behavioral disorders: Secondary | ICD-10-CM | POA: Insufficient documentation

## 2011-08-24 DIAGNOSIS — D869 Sarcoidosis, unspecified: Secondary | ICD-10-CM | POA: Insufficient documentation

## 2011-08-24 LAB — BASIC METABOLIC PANEL
Calcium: 9.5 mg/dL (ref 8.4–10.5)
Creatinine, Ser: 0.81 mg/dL (ref 0.50–1.10)
GFR calc Af Amer: >60 mL/min (ref >60)
GFR calc non Af Amer: >60 mL/min (ref >60)
Sodium: 138 mEq/L (ref 135–145)

## 2011-08-24 LAB — CBC
MCH: 26.9 pg (ref 26.0–34.0)
MCV: 84.8 fL (ref 78.0–100.0)
Platelets: 224 10*3/uL (ref 150–400)
RBC: 3.94 MIL/uL (ref 3.87–5.11)
RDW: 17.5 % — ABNORMAL HIGH (ref 11.5–15.5)
WBC: 4.3 10*3/uL (ref 4.0–10.5)

## 2011-08-24 LAB — DIFFERENTIAL
Basophils Relative: 1 % (ref 0–1)
Eosinophils Absolute: 0.1 10*3/uL (ref 0.0–0.7)
Eosinophils Relative: 3 % (ref 0–5)
Lymphs Abs: 1.4 10*3/uL (ref 0.7–4.0)
Monocytes Relative: 8 % (ref 3–12)
Neutrophils Relative %: 55 % (ref 43–77)

## 2011-08-24 LAB — URINALYSIS, ROUTINE W REFLEX MICROSCOPIC
Ketones, ur: NEGATIVE mg/dL
Leukocytes, UA: NEGATIVE
Nitrite: NEGATIVE
Protein, ur: NEGATIVE mg/dL
Urobilinogen, UA: 0.2 mg/dL (ref 0.0–1.0)

## 2011-09-22 ENCOUNTER — Encounter: Payer: Self-pay | Admitting: Obstetrics & Gynecology

## 2011-09-22 ENCOUNTER — Ambulatory Visit: Payer: Medicare Other | Admitting: Obstetrics & Gynecology

## 2011-09-27 LAB — URINALYSIS, ROUTINE W REFLEX MICROSCOPIC
Hgb urine dipstick: NEGATIVE
Nitrite: NEGATIVE
Nitrite: NEGATIVE
Specific Gravity, Urine: 1.018
Specific Gravity, Urine: 1.02
Urobilinogen, UA: 1
pH: 7

## 2011-09-27 LAB — DIFFERENTIAL
Basophils Absolute: 0
Eosinophils Absolute: 0.1
Eosinophils Relative: 2
Lymphocytes Relative: 27
Lymphs Abs: 0.6 — ABNORMAL LOW
Lymphs Abs: 1.8
Monocytes Absolute: 0.1
Neutro Abs: 1.4 — ABNORMAL LOW

## 2011-09-27 LAB — CBC
HCT: 34.8 — ABNORMAL LOW
Hemoglobin: 12.4
MCV: 82.8
MCV: 84.2
Platelets: 193
Platelets: 51 — ABNORMAL LOW
RDW: 17.6 — ABNORMAL HIGH
RDW: 17.8 — ABNORMAL HIGH
WBC: 2.1 — ABNORMAL LOW
WBC: 2.3 — ABNORMAL LOW
WBC: 4.4

## 2011-09-27 LAB — BASIC METABOLIC PANEL
BUN: 6
CO2: 27
Chloride: 107
Glucose, Bld: 98
Potassium: 3.5

## 2011-09-27 LAB — PREGNANCY, URINE: Preg Test, Ur: NEGATIVE

## 2011-09-27 LAB — HEPATIC FUNCTION PANEL
Albumin: 3.6
Alkaline Phosphatase: 98
Bilirubin, Direct: 0.4 — ABNORMAL HIGH
Indirect Bilirubin: 0.8
Total Bilirubin: 1.2

## 2011-09-27 LAB — RAPID URINE DRUG SCREEN, HOSP PERFORMED
Amphetamines: NOT DETECTED
Barbiturates: NOT DETECTED
Barbiturates: NOT DETECTED
Cocaine: POSITIVE — AB
Opiates: NOT DETECTED

## 2011-09-27 LAB — TSH: TSH: 2.868

## 2011-09-27 LAB — POCT I-STAT, CHEM 8
BUN: 9
Chloride: 102
Glucose, Bld: 100 — ABNORMAL HIGH
HCT: 40
Potassium: 3.5

## 2011-09-27 LAB — POCT PREGNANCY, URINE: Preg Test, Ur: NEGATIVE

## 2011-09-27 LAB — ETHANOL: Alcohol, Ethyl (B): <5

## 2011-09-29 LAB — COMPREHENSIVE METABOLIC PANEL
Alkaline Phosphatase: 89 U/L (ref 39–117)
BUN: 10 mg/dL (ref 6–23)
CO2: 31 mEq/L (ref 19–32)
Calcium: 9.2 mg/dL (ref 8.4–10.5)
GFR calc non Af Amer: 47 mL/min — ABNORMAL LOW (ref >60)
Glucose, Bld: 132 mg/dL — ABNORMAL HIGH (ref 70–99)
Total Protein: 6.8 g/dL (ref 6.0–8.3)

## 2011-09-29 LAB — DIFFERENTIAL
Basophils Relative: 0 % (ref 0–1)
Lymphs Abs: 0.7 10*3/uL (ref 0.7–4.0)
Monocytes Absolute: 0.2 10*3/uL (ref 0.1–1.0)
Monocytes Relative: 11 % (ref 3–12)
Neutro Abs: 1.2 10*3/uL — ABNORMAL LOW (ref 1.7–7.7)

## 2011-09-29 LAB — URINE DRUGS OF ABUSE SCREEN W ALC, ROUTINE (REF LAB)
Amphetamine Screen, Ur: NEGATIVE
Barbiturate Quant, Ur: NEGATIVE
Creatinine,U: 56.2 mg/dL
Marijuana Metabolite: NEGATIVE
Methadone: NEGATIVE

## 2011-09-29 LAB — BENZODIAZEPINE, QUANTITATIVE, URINE: Nordiazepam GC/MS Conf: NEGATIVE

## 2011-09-29 LAB — CBC
HCT: 32.7 % — ABNORMAL LOW (ref 36.0–46.0)
Hemoglobin: 10.6 g/dL — ABNORMAL LOW (ref 12.0–15.0)
MCHC: 32.5 g/dL (ref 30.0–36.0)
MCV: 83.9 fL (ref 78.0–100.0)
RDW: 17.2 % — ABNORMAL HIGH (ref 11.5–15.5)

## 2011-09-29 LAB — LITHIUM LEVEL: Lithium Lvl: 0.49 mEq/L — ABNORMAL LOW (ref 0.80–1.40)

## 2011-10-10 LAB — I-STAT 8, (EC8 V) (CONVERTED LAB)
Acid-base deficit: 6 — ABNORMAL HIGH
Chloride: 107
Hemoglobin: 11.6 — ABNORMAL LOW
Potassium: 3.6
Sodium: 141
TCO2: 20

## 2011-10-10 LAB — COMPREHENSIVE METABOLIC PANEL
Albumin: 2.5 — ABNORMAL LOW
BUN: 3 — ABNORMAL LOW
Calcium: 8.1 — ABNORMAL LOW
Creatinine, Ser: 0.69
Total Bilirubin: 3.2 — ABNORMAL HIGH
Total Protein: 5.9 — ABNORMAL LOW

## 2011-10-10 LAB — DIFFERENTIAL
Basophils Relative: 1
Eosinophils Absolute: 0
Eosinophils Absolute: 0
Eosinophils Relative: 1
Eosinophils Relative: 1
Lymphs Abs: 1
Monocytes Absolute: 0.2
Monocytes Absolute: 0.2
Monocytes Relative: 6
Monocytes Relative: 8
Neutrophils Relative %: 59

## 2011-10-10 LAB — BASIC METABOLIC PANEL
CO2: 24
GFR calc non Af Amer: >60
Glucose, Bld: 81
Potassium: 3.6
Sodium: 140

## 2011-10-10 LAB — HEPATIC FUNCTION PANEL
Bilirubin, Direct: 1.6 — ABNORMAL HIGH
Indirect Bilirubin: 1.2 — ABNORMAL HIGH

## 2011-10-10 LAB — CBC
HCT: 27.1 — ABNORMAL LOW
HCT: 29.2 — ABNORMAL LOW
Hemoglobin: 8.9 — ABNORMAL LOW
Hemoglobin: 9.7 — ABNORMAL LOW
MCHC: 33.4
MCV: 86.3
MCV: 87.6
Platelets: 169
RBC: 3.33 — ABNORMAL LOW
RBC: 3.56 — ABNORMAL LOW
RDW: 16.5 — ABNORMAL HIGH
WBC: 2.6 — ABNORMAL LOW

## 2011-10-10 LAB — RAPID URINE DRUG SCREEN, HOSP PERFORMED
Amphetamines: NOT DETECTED
Benzodiazepines: POSITIVE — AB
Tetrahydrocannabinol: POSITIVE — AB

## 2011-10-10 LAB — TRICYCLICS SCREEN, URINE: TCA Scrn: NOT DETECTED

## 2011-10-10 LAB — PROTIME-INR
INR: 1.3
Prothrombin Time: 16.4 — ABNORMAL HIGH

## 2011-10-10 LAB — APTT: aPTT: 38 — ABNORMAL HIGH

## 2011-10-11 LAB — I-STAT 8, (EC8 V) (CONVERTED LAB)
Acid-Base Excess: 1
Chloride: 101
HCT: 35 — ABNORMAL LOW
Operator id: 189501
Potassium: 4
TCO2: 27
pCO2, Ven: 42.3 — ABNORMAL LOW

## 2011-10-11 LAB — URINALYSIS, ROUTINE W REFLEX MICROSCOPIC
Glucose, UA: NEGATIVE
Specific Gravity, Urine: 1.024
Urobilinogen, UA: 4 — ABNORMAL HIGH
pH: 6

## 2011-10-11 LAB — CBC
HCT: 25.8 — ABNORMAL LOW
HCT: 29.1 — ABNORMAL LOW
HCT: 30 — ABNORMAL LOW
HCT: 30.5 — ABNORMAL LOW
HCT: 30.8 — ABNORMAL LOW
HCT: 31.2 — ABNORMAL LOW
HCT: 31.7 — ABNORMAL LOW
HCT: 33.1 — ABNORMAL LOW
Hemoglobin: 10.4 — ABNORMAL LOW
Hemoglobin: 10.5 — ABNORMAL LOW
Hemoglobin: 10.6 — ABNORMAL LOW
Hemoglobin: 10.9 — ABNORMAL LOW
Hemoglobin: 11.1 — ABNORMAL LOW
Hemoglobin: 11.1 — ABNORMAL LOW
Hemoglobin: 8.5 — ABNORMAL LOW
Hemoglobin: 9.1 — ABNORMAL LOW
Hemoglobin: 9.2 — ABNORMAL LOW
Hemoglobin: 9.2 — ABNORMAL LOW
Hemoglobin: 9.5 — ABNORMAL LOW
Hemoglobin: 9.5 — ABNORMAL LOW
Hemoglobin: 9.6 — ABNORMAL LOW
Hemoglobin: 9.8 — ABNORMAL LOW
Hemoglobin: 9.8 — ABNORMAL LOW
MCHC: 33.6
MCHC: 33.6
MCHC: 33.6
MCHC: 33.7
MCHC: 33.9
MCHC: 34
MCHC: 34
MCHC: 34
MCHC: 34.1
MCHC: 34.1
MCHC: 34.3
MCHC: 34.3
MCV: 88
MCV: 88.2
MCV: 88.4
MCV: 88.6
MCV: 88.7
MCV: 88.7
MCV: 88.7
MCV: 88.7
MCV: 88.9
MCV: 89.1
Platelets: 65 — ABNORMAL LOW
Platelets: 70 — ABNORMAL LOW
Platelets: 72 — ABNORMAL LOW
Platelets: 73 — ABNORMAL LOW
Platelets: 75 — ABNORMAL LOW
Platelets: 75 — ABNORMAL LOW
Platelets: 80 — ABNORMAL LOW
Platelets: 91 — ABNORMAL LOW
Platelets: 92 — ABNORMAL LOW
RBC: 2.88 — ABNORMAL LOW
RBC: 3.1 — ABNORMAL LOW
RBC: 3.17 — ABNORMAL LOW
RBC: 3.26 — ABNORMAL LOW
RBC: 3.27 — ABNORMAL LOW
RBC: 3.47 — ABNORMAL LOW
RBC: 3.52 — ABNORMAL LOW
RBC: 3.72 — ABNORMAL LOW
RDW: 15.4 — ABNORMAL HIGH
RDW: 15.5 — ABNORMAL HIGH
RDW: 15.6 — ABNORMAL HIGH
RDW: 15.8 — ABNORMAL HIGH
RDW: 15.9 — ABNORMAL HIGH
RDW: 15.9 — ABNORMAL HIGH
RDW: 15.9 — ABNORMAL HIGH
RDW: 16 — ABNORMAL HIGH
RDW: 16 — ABNORMAL HIGH
RDW: 16.1 — ABNORMAL HIGH
RDW: 16.1 — ABNORMAL HIGH
RDW: 16.1 — ABNORMAL HIGH
RDW: 16.4 — ABNORMAL HIGH
WBC: 1.5 — ABNORMAL LOW
WBC: 1.5 — ABNORMAL LOW
WBC: 1.6 — ABNORMAL LOW
WBC: 1.8 — ABNORMAL LOW
WBC: 1.8 — ABNORMAL LOW
WBC: 1.9 — ABNORMAL LOW
WBC: 2 — ABNORMAL LOW
WBC: 2.4 — ABNORMAL LOW

## 2011-10-11 LAB — HEPATITIS C ANTIBODY: HCV Ab: NEGATIVE

## 2011-10-11 LAB — COMPREHENSIVE METABOLIC PANEL
ALT: 23
ALT: 24
AST: 61 — ABNORMAL HIGH
AST: 70 — ABNORMAL HIGH
Albumin: 2 — ABNORMAL LOW
Alkaline Phosphatase: 70
BUN: 2 — ABNORMAL LOW
CO2: 28
Calcium: 8.2 — ABNORMAL LOW
Calcium: 8.3 — ABNORMAL LOW
Chloride: 102
Creatinine, Ser: 0.68
GFR calc Af Amer: >60
GFR calc Af Amer: >60
GFR calc non Af Amer: >60
Glucose, Bld: 118 — ABNORMAL HIGH
Potassium: 4.1
Sodium: 131 — ABNORMAL LOW
Sodium: 134 — ABNORMAL LOW
Total Bilirubin: 3.1 — ABNORMAL HIGH
Total Protein: 6
Total Protein: 6.1

## 2011-10-11 LAB — CULTURE, BLOOD (ROUTINE X 2)

## 2011-10-11 LAB — RAPID URINE DRUG SCREEN, HOSP PERFORMED
Barbiturates: NOT DETECTED
Benzodiazepines: POSITIVE — AB
Cocaine: POSITIVE — AB
Opiates: NOT DETECTED

## 2011-10-11 LAB — PROTIME-INR
INR: 1.2
INR: 1.2

## 2011-10-11 LAB — LIPID PANEL
HDL: 18 — ABNORMAL LOW
Total CHOL/HDL Ratio: 7.4
VLDL: 26

## 2011-10-11 LAB — BASIC METABOLIC PANEL
Calcium: 7.9 — ABNORMAL LOW
GFR calc Af Amer: >60
GFR calc non Af Amer: >60
Glucose, Bld: 110 — ABNORMAL HIGH
Sodium: 134 — ABNORMAL LOW

## 2011-10-11 LAB — HEPATIC FUNCTION PANEL
ALT: 26
AST: 70 — ABNORMAL HIGH
Bilirubin, Direct: 2 — ABNORMAL HIGH
Indirect Bilirubin: 1.8 — ABNORMAL HIGH
Total Bilirubin: 3.8 — ABNORMAL HIGH

## 2011-10-11 LAB — CROSSMATCH

## 2011-10-11 LAB — DIFFERENTIAL
Basophils Absolute: 0
Basophils Absolute: 0
Basophils Relative: 0
Eosinophils Absolute: 0
Eosinophils Relative: 1
Lymphocytes Relative: 42
Monocytes Absolute: 0.2
Neutro Abs: 0.7 — ABNORMAL LOW

## 2011-10-11 LAB — RETICULOCYTES
RBC.: 3.38 — ABNORMAL LOW
Retic Count, Absolute: 67.6

## 2011-10-11 LAB — HEPATITIS B SURFACE ANTIGEN: Hepatitis B Surface Ag: NEGATIVE

## 2011-10-11 LAB — AMMONIA
Ammonia: 59 — ABNORMAL HIGH
Ammonia: 80 — ABNORMAL HIGH

## 2011-10-11 LAB — HIV ANTIBODY (ROUTINE TESTING W REFLEX): HIV: NONREACTIVE

## 2011-10-11 LAB — HEPATITIS B SURFACE ANTIBODY,QUALITATIVE: Hep B S Ab: NEGATIVE

## 2011-10-11 LAB — URINE MICROSCOPIC-ADD ON

## 2011-10-11 LAB — URINE CULTURE

## 2011-10-11 LAB — IRON AND TIBC
Iron: 22 — ABNORMAL LOW
UIBC: 265

## 2011-10-11 LAB — ETHANOL: Alcohol, Ethyl (B): <5

## 2011-10-11 LAB — POCT I-STAT CREATININE
Creatinine, Ser: 0.6
Operator id: 189501

## 2011-10-11 LAB — VITAMIN B12: Vitamin B-12: 893 (ref 211–911)

## 2011-10-24 NOTE — Op Note (Signed)
  NAMEMELESSA, COWELL NO.:  [id number]  MEDICAL RECORD NO.:  [id number]  LOCATION:  5118                         FACILITY:  [facility]  PHYSICIAN:  Scheryl Darter, MD       DATE OF BIRTH:  January 20, 1966  DATE OF PROCEDURE:  08/02/2011 DATE OF DISCHARGE:                              OPERATIVE REPORT   PROCEDURES:  Exploratory laparotomy and repair of fascial wound dehiscence.  PREOPERATIVE DIAGNOSIS:  Probable wound dehiscence.  POSTOPERATIVE DIAGNOSIS:  Probable wound dehiscence.  SURGEON:  Scheryl Darter, MD  ASSISTANT:  Cherylynn Ridges, MD  ANESTHESIA:  General.  ESTIMATED BLOOD LOSS:  15 mL.  SPECIMENS:  None.  DRAINS:  Foley catheter.  COUNTS:  Correct.  OPERATIVE COURSE:  The patient gave written consent for exploration of her surgical abdominal wound.  The patient has had discharge from the wound and a partial skin dehiscence which exhibited the omentum protruding.  The patient was postop day 5 from exploratory laparotomy due to bleeding after vaginal hysterectomy.  The patient's identification was confirmed.  She was brought to the OR and general anesthesia was induced.  She was placed in dorsal supine position.  The abdomen sterilely prepped and draped.  The wound was inspected and there is about a 2-cm opening at the skin and peritoneal fat was noted.  The skin suture was removed and appeared that nearly entire length of a fascial closure had opened.  Dr. Lindie Spruce was in attendance during the procedure.  There appeared to be no bowel injury or any sign of poor perfusion.  Peritoneal fluid was serosanguineous and there was no sign of purulence.  We proceeded with closure of the fascia and peritoneum. No Leksell was used with interrupted figure-of-eight closure to close the peritoneum and fascia.  Good closure was exhibited.  The wound was then irrigated with sterile saline.  There was no bleeding.  A 3-0 plain gut suture was placed in interrupted  stitches to reapproximate the Scarpa fascia and closed these space in the subcutaneous layer.  The skin was closed with staples.  Pressure dressing was applied.  The patient also was brought to the OR with an abdominal binder.  She tolerated the procedure well.  Estimated blood loss was less than 15 mL.  The patient received 2 g of IV cefazolin prior to surgery.  She was brought in stable condition and extubated to the PACU.     Scheryl Darter, MD     JA/MEDQ  D:  08/02/2011  T:  08/02/2011  Job:  [id number]  Electronically Signed by Scheryl Darter MD on 10/24/2011 11:05:25 AM

## 2012-01-27 ENCOUNTER — Ambulatory Visit: Payer: Medicare Other | Admitting: Obstetrics & Gynecology

## 2012-02-02 IMAGING — CR DG ABDOMEN 2V
2 series · 2 of 2 positions shown · non-contrast
Comparison: 07/29/2011

CLINICAL DATA: Abdominal pain

ABDOMEN - 2 VIEW

[w abdomen upright]
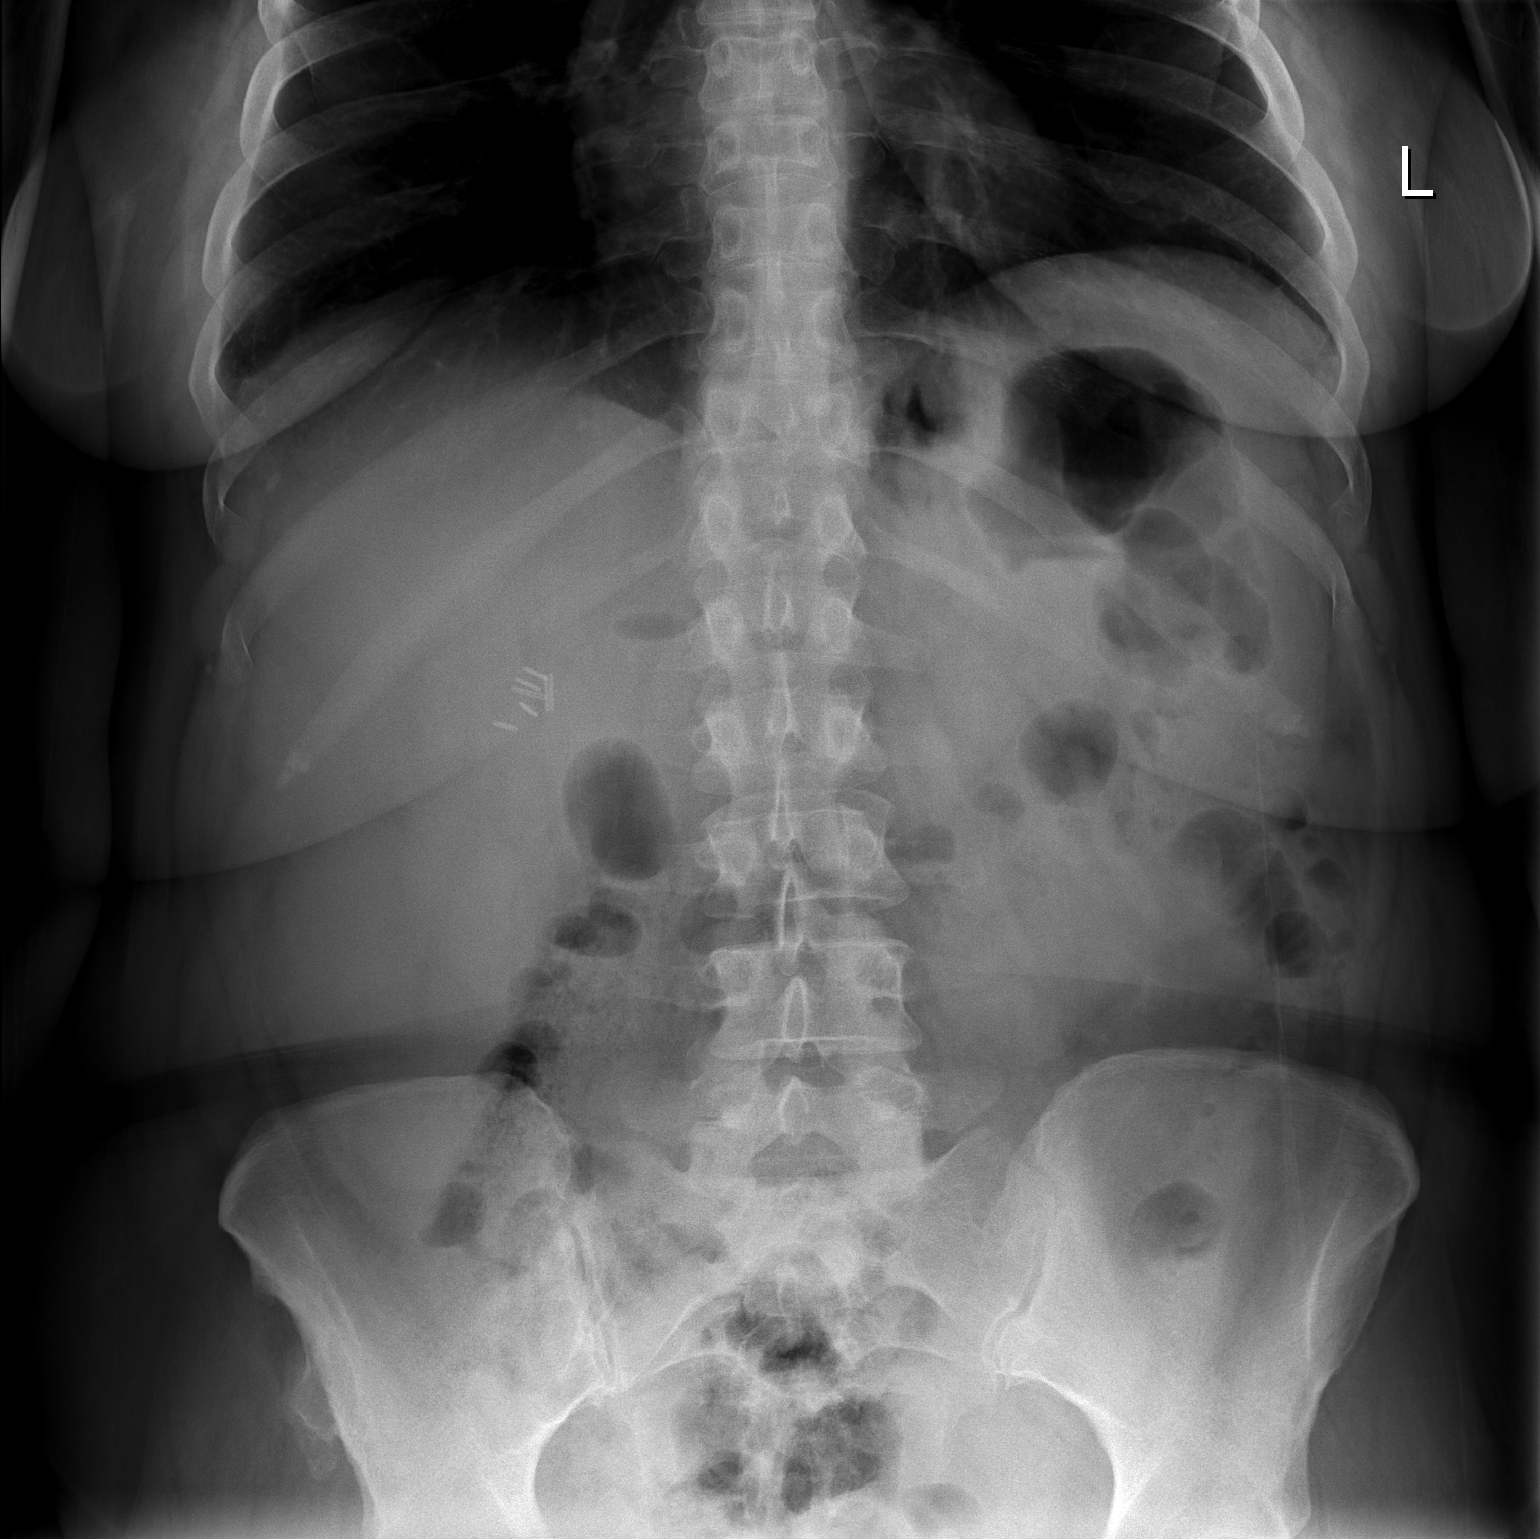

[t abdomen supine]
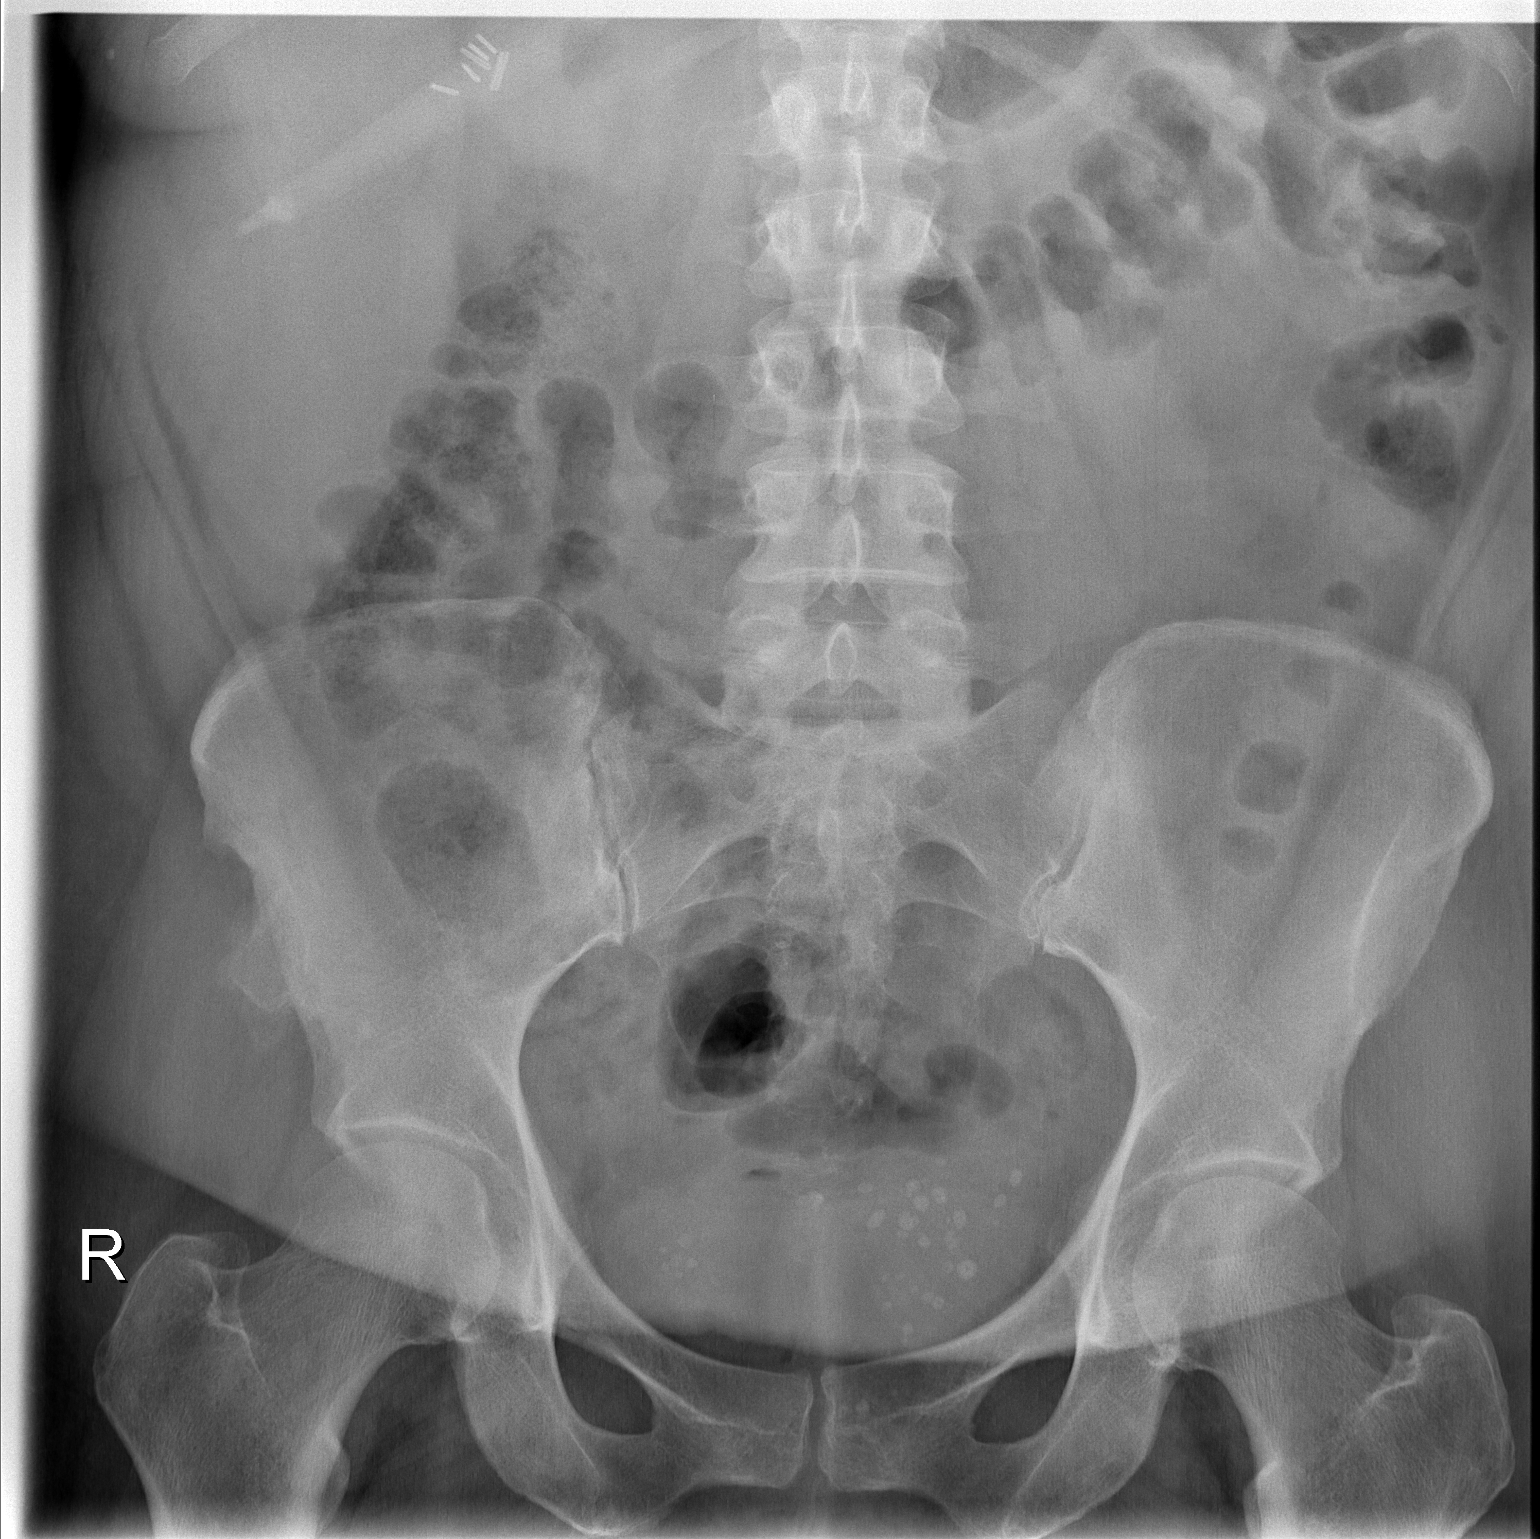

[2 of 2 positions shown; findings below may reference images not displayed]

FINDINGS: Right upper quadrant cholecystectomy clips identified.

No dilated loops of small bowel or air-fluid levels identified.

There is gas and stool seen throughout the colon up to the rectum.
Multiple calcified phleboliths within the pelvis.
IMPRESSION: 1.  Nonobstructive bowel gas pattern.

## 2012-03-06 ENCOUNTER — Encounter (HOSPITAL_COMMUNITY): Payer: Self-pay | Admitting: *Deleted

## 2012-03-06 ENCOUNTER — Inpatient Hospital Stay (HOSPITAL_COMMUNITY)
Admission: EM | Admit: 2012-03-06 | Discharge: 2012-03-11 | DRG: 433 | Disposition: A | Discharge status: Home / Self Care | Payer: Medicare Other | Attending: Internal Medicine | Admitting: Internal Medicine

## 2012-03-06 DIAGNOSIS — G8921 Chronic pain due to trauma: Secondary | ICD-10-CM

## 2012-03-06 DIAGNOSIS — K644 Residual hemorrhoidal skin tags: Secondary | ICD-10-CM

## 2012-03-06 DIAGNOSIS — K7030 Alcoholic cirrhosis of liver without ascites: Principal | ICD-10-CM | POA: Diagnosis present

## 2012-03-06 DIAGNOSIS — F10939 Alcohol use, unspecified with withdrawal, unspecified: Secondary | ICD-10-CM

## 2012-03-06 DIAGNOSIS — K228 Other specified diseases of esophagus: Secondary | ICD-10-CM

## 2012-03-06 DIAGNOSIS — Z8659 Personal history of other mental and behavioral disorders: Secondary | ICD-10-CM

## 2012-03-06 DIAGNOSIS — R7989 Other specified abnormal findings of blood chemistry: Secondary | ICD-10-CM

## 2012-03-06 DIAGNOSIS — R490 Dysphonia: Secondary | ICD-10-CM | POA: Diagnosis present

## 2012-03-06 DIAGNOSIS — I129 Hypertensive chronic kidney disease with stage 1 through stage 4 chronic kidney disease, or unspecified chronic kidney disease: Secondary | ICD-10-CM

## 2012-03-06 DIAGNOSIS — I8500 Esophageal varices without bleeding: Secondary | ICD-10-CM

## 2012-03-06 DIAGNOSIS — K439 Ventral hernia without obstruction or gangrene: Secondary | ICD-10-CM

## 2012-03-06 DIAGNOSIS — M79609 Pain in unspecified limb: Secondary | ICD-10-CM

## 2012-03-06 DIAGNOSIS — Z6841 Body Mass Index (BMI) 40.0 and over, adult: Secondary | ICD-10-CM

## 2012-03-06 DIAGNOSIS — R498 Other voice and resonance disorders: Secondary | ICD-10-CM

## 2012-03-06 DIAGNOSIS — K253 Acute gastric ulcer without hemorrhage or perforation: Secondary | ICD-10-CM

## 2012-03-06 DIAGNOSIS — E538 Deficiency of other specified B group vitamins: Secondary | ICD-10-CM | POA: Diagnosis present

## 2012-03-06 DIAGNOSIS — M25569 Pain in unspecified knee: Secondary | ICD-10-CM

## 2012-03-06 DIAGNOSIS — K649 Unspecified hemorrhoids: Secondary | ICD-10-CM

## 2012-03-06 DIAGNOSIS — E669 Obesity, unspecified: Secondary | ICD-10-CM | POA: Diagnosis present

## 2012-03-06 DIAGNOSIS — F10229 Alcohol dependence with intoxication, unspecified: Secondary | ICD-10-CM | POA: Diagnosis present

## 2012-03-06 DIAGNOSIS — Z8719 Personal history of other diseases of the digestive system: Secondary | ICD-10-CM

## 2012-03-06 DIAGNOSIS — I10 Essential (primary) hypertension: Secondary | ICD-10-CM | POA: Diagnosis present

## 2012-03-06 DIAGNOSIS — R609 Edema, unspecified: Secondary | ICD-10-CM | POA: Diagnosis present

## 2012-03-06 DIAGNOSIS — D500 Iron deficiency anemia secondary to blood loss (chronic): Secondary | ICD-10-CM

## 2012-03-06 DIAGNOSIS — E039 Hypothyroidism, unspecified: Secondary | ICD-10-CM | POA: Diagnosis present

## 2012-03-06 DIAGNOSIS — F319 Bipolar disorder, unspecified: Secondary | ICD-10-CM | POA: Diagnosis present

## 2012-03-06 DIAGNOSIS — N920 Excessive and frequent menstruation with regular cycle: Secondary | ICD-10-CM

## 2012-03-06 DIAGNOSIS — H5789 Other specified disorders of eye and adnexa: Secondary | ICD-10-CM

## 2012-03-06 DIAGNOSIS — K709 Alcoholic liver disease, unspecified: Secondary | ICD-10-CM

## 2012-03-06 DIAGNOSIS — F1010 Alcohol abuse, uncomplicated: Secondary | ICD-10-CM | POA: Diagnosis present

## 2012-03-06 DIAGNOSIS — F329 Major depressive disorder, single episode, unspecified: Secondary | ICD-10-CM

## 2012-03-06 DIAGNOSIS — K766 Portal hypertension: Secondary | ICD-10-CM | POA: Diagnosis present

## 2012-03-06 DIAGNOSIS — F1910 Other psychoactive substance abuse, uncomplicated: Secondary | ICD-10-CM

## 2012-03-06 DIAGNOSIS — K449 Diaphragmatic hernia without obstruction or gangrene: Secondary | ICD-10-CM

## 2012-03-06 DIAGNOSIS — G4700 Insomnia, unspecified: Secondary | ICD-10-CM

## 2012-03-06 DIAGNOSIS — D869 Sarcoidosis, unspecified: Secondary | ICD-10-CM

## 2012-03-06 DIAGNOSIS — D696 Thrombocytopenia, unspecified: Secondary | ICD-10-CM

## 2012-03-06 DIAGNOSIS — F10239 Alcohol dependence with withdrawal, unspecified: Secondary | ICD-10-CM

## 2012-03-06 DIAGNOSIS — F17200 Nicotine dependence, unspecified, uncomplicated: Secondary | ICD-10-CM | POA: Diagnosis present

## 2012-03-06 HISTORY — DX: Hepatic failure, unspecified without coma: K72.90

## 2012-03-06 LAB — COMPREHENSIVE METABOLIC PANEL
ALT: 31 U/L (ref 0–35)
AST: 75 U/L — ABNORMAL HIGH (ref 0–37)
Albumin: 3.7 g/dL (ref 3.5–5.2)
Alkaline Phosphatase: 167 U/L — ABNORMAL HIGH (ref 39–117)
BUN: 5 mg/dL — ABNORMAL LOW (ref 6–23)
CO2: 24 mEq/L (ref 19–32)
Calcium: 9.5 mg/dL (ref 8.4–10.5)
Chloride: 96 mEq/L (ref 96–112)
Creatinine, Ser: 0.89 mg/dL (ref 0.50–1.10)
GFR calc Af Amer: 88 mL/min — ABNORMAL LOW (ref >90)
GFR calc non Af Amer: 76 mL/min — ABNORMAL LOW (ref >90)
Glucose, Bld: 115 mg/dL — ABNORMAL HIGH (ref 70–99)
Potassium: 3.3 mEq/L — ABNORMAL LOW (ref 3.5–5.1)
Sodium: 134 mEq/L — ABNORMAL LOW (ref 135–145)
Total Bilirubin: 0.7 mg/dL (ref 0.3–1.2)
Total Protein: 7.7 g/dL (ref 6.0–8.3)

## 2012-03-06 LAB — CBC
HCT: 37.6 % (ref 36.0–46.0)
MCHC: 33.2 g/dL (ref 30.0–36.0)
Platelets: 46 10*3/uL — ABNORMAL LOW (ref 150–400)
RDW: 15.5 % (ref 11.5–15.5)
WBC: 1.8 10*3/uL — ABNORMAL LOW (ref 4.0–10.5)

## 2012-03-06 LAB — ETHANOL: Alcohol, Ethyl (B): 53 mg/dL — ABNORMAL HIGH (ref 0–11)

## 2012-03-06 NOTE — ED Provider Notes (Signed)
History     CSN: [id number]  Arrival date & time 03/06/12  2050   First MD Initiated Contact with Patient 03/06/12 2336      Chief Complaint  Patient presents with  . Medical Clearance    wants detox from ETOH.  denies SI/HI  . Hand Problem    bilat hand swelling d/t cirrhosis per pt.     (Consider location/radiation/quality/duration/timing/severity/associated sxs/prior treatment) Patient is a 46 y.o. female presenting with drug/alcohol assessment and alcohol problem. The history is provided by the patient. No language interpreter was used.  Drug / Alcohol Assessment Primary symptoms include no confusion, no somnolence, no loss of consciousness, no seizures, no weakness, no agitation, no delusions, no hallucinations, no self-injury, no violence, and no intoxication. This is a chronic problem. The current episode started more than 1 week ago. The problem has not changed since onset.Suspected agents include alcohol. Pertinent negatives include no injury, no vomiting and no bowel incontinence. Associated medical issues include addiction treatment, chronic illness and psychiatric history. Associated medical issues do not include withdrawal syndrome.  Alcohol Problem This is a chronic problem. The current episode started more than 1 week ago. The problem occurs constantly. The problem has not changed since onset.Pertinent negatives include no chest pain, no abdominal pain, no headaches and no shortness of breath. The symptoms are aggravated by nothing. The symptoms are relieved by nothing. She has tried nothing for the symptoms. The treatment provided no relief.    Past Medical History  Diagnosis Date  . Hypothyroidism   . Blood transfusion July 2012  . Depression   . Anxiety   . Bipolar 1 disorder   . Menorrhagia   . Liver failure     Past Surgical History  Procedure Date  . Tubal ligation   . Laparoscopy   . Orthopedic surgery   . Cholecystectomy   . Fracture surgery   .  Hernia repair   . Vaginal hysterectomy 07/27/2011    Procedure: HYSTERECTOMY VAGINAL;  Surgeon: Scheryl Darter, MD;  Location: [facility];  Service: Gynecology;  Laterality: N/A;  Vaginal Hysterectomy with Right Oophorectomy  . Laparotomy 07/28/2011    Procedure: EXPLORATORY LAPAROTOMY;  Surgeon: Tilda Burrow, MD;  Location: [facility];  Service: Gynecology;  Laterality: N/A;    Family History  Problem Relation Age of Onset  . Diabetes Mother   . Hypertension Mother   . Diabetes Daughter   . Hypertension Daughter     History  Substance Use Topics  . Smoking status: Current Some Day Smoker  . Smokeless tobacco: Not on file  . Alcohol Use: Yes    OB History    Grav Para Term Preterm Abortions TAB SAB Ect Mult Living   4 3 3  0 1     3      Review of Systems  Constitutional: Negative.   HENT: Negative.   Eyes: Negative.   Respiratory: Negative for shortness of breath.   Cardiovascular: Negative for chest pain.  Gastrointestinal: Negative.  Negative for vomiting, abdominal pain and bowel incontinence.  Genitourinary: Negative.   Musculoskeletal: Negative.   Neurological: Negative for seizures, loss of consciousness, weakness and headaches.  Hematological: Negative for adenopathy.  Psychiatric/Behavioral: Negative for hallucinations, confusion, self-injury and agitation.    Allergies  Fluoxetine hcl; Metoclopramide hcl; Morphine and related; and Tylenol  Home Medications   Current Outpatient Rx  Name Route Sig Dispense Refill  . BUPROPION HCL ER (SR) 150 MG PO TB12 Oral Take 150 mg by  mouth daily.     Marland Kitchen CARISOPRODOL 350 MG PO TABS Oral Take 350 mg by mouth at bedtime.      Marland Kitchen CLONAZEPAM 0.5 MG PO TABS Oral Take 0.5 mg by mouth 2 (two) times daily as needed. anxiety    . DULOXETINE HCL 60 MG PO CPEP Oral Take 60 mg by mouth 2 (two) times daily.      Marland Kitchen GABAPENTIN 800 MG PO TABS Oral Take 800 mg by mouth 3 (three) times daily.      Marland Kitchen HYDROCODONE-ACETAMINOPHEN 5-325 MG PO TABS Oral  Take 1 tablet by mouth every 6 (six) hours as needed. Pain    . IBUPROFEN 200 MG PO TABS Oral Take 200 mg by mouth every 6 (six) hours as needed. headache     . QUETIAPINE FUMARATE 400 MG PO TABS Oral Take 200-400 mg by mouth 3 (three) times daily. Patient takes 200mg  at 7am and 2pm and takes 400mg  at bedtime       BP 149/100  Pulse 98  Temp 98.5 F (36.9 C)  Resp 20  SpO2 100%  Physical Exam  Constitutional: She is oriented to person, place, and time. She appears well-developed and well-nourished.  HENT:  Head: Normocephalic and atraumatic.  Eyes: Conjunctivae are normal. Pupils are equal, round, and reactive to light.  Neck: Normal range of motion. Neck supple.  Cardiovascular: Normal rate and regular rhythm.   Pulmonary/Chest: Effort normal and breath sounds normal. She has no wheezes. She has no rales.  Abdominal: Soft. Bowel sounds are normal. There is no tenderness. There is no rebound and no guarding.  Musculoskeletal: Normal range of motion. She exhibits edema.  Neurological: She is alert and oriented to person, place, and time. She has normal reflexes.  Skin: Skin is warm and dry.  Psychiatric: She has a normal mood and affect.    ED Course  Procedures (including critical care time)  Labs Reviewed  CBC - Abnormal; Notable for the following:    WBC 1.8 (*)    Platelets 46 (*)    All other components within normal limits  COMPREHENSIVE METABOLIC PANEL - Abnormal; Notable for the following:    Sodium 134 (*)    Potassium 3.3 (*)    Glucose, Bld 115 (*)    BUN 5 (*)    AST 75 (*)    Alkaline Phosphatase 167 (*)    GFR calc non Af Amer 76 (*)    GFR calc Af Amer 88 (*)    All other components within normal limits  ETHANOL - Abnormal; Notable for the following:    Alcohol, Ethyl (B) 53 (*)    All other components within normal limits  URINE RAPID DRUG SCREEN (HOSP PERFORMED)  HEPATIC FUNCTION PANEL  PROTIME-INR  AMMONIA   No results found.   No diagnosis  found.  Results for orders placed during the hospital encounter of 03/06/12  CBC      Component Value Range   WBC 1.8 (*) 4.0 - 10.5 (K/uL)   RBC 4.35  3.87 - 5.11 (MIL/uL)   Hemoglobin 12.5  12.0 - 15.0 (g/dL)   HCT 16.1  09.6 - 04.5 (%)   MCV 86.4  78.0 - 100.0 (fL)   MCH 28.7  26.0 - 34.0 (pg)   MCHC 33.2  30.0 - 36.0 (g/dL)   RDW 40.9  81.1 - 91.4 (%)   Platelets 46 (*) 150 - 400 (K/uL)  COMPREHENSIVE METABOLIC PANEL      Component Value  Range   Sodium 134 (*) 135 - 145 (mEq/L)   Potassium 3.3 (*) 3.5 - 5.1 (mEq/L)   Chloride 96  96 - 112 (mEq/L)   CO2 24  19 - 32 (mEq/L)   Glucose, Bld 115 (*) 70 - 99 (mg/dL)   BUN 5 (*) 6 - 23 (mg/dL)   Creatinine, Ser 1.61  0.50 - 1.10 (mg/dL)   Calcium 9.5  8.4 - 09.6 (mg/dL)   Total Protein 7.7  6.0 - 8.3 (g/dL)   Albumin 3.7  3.5 - 5.2 (g/dL)   AST 75 (*) 0 - 37 (U/L)   ALT 31  0 - 35 (U/L)   Alkaline Phosphatase 167 (*) 39 - 117 (U/L)   Total Bilirubin 0.7  0.3 - 1.2 (mg/dL)   GFR calc non Af Amer 76 (*) >90 (mL/min)   GFR calc Af Amer 88 (*) >90 (mL/min)  ETHANOL      Component Value Range   Alcohol, Ethyl (B) 53 (*) 0 - 11 (mg/dL)   No results found.    MDM  Will need admission        Jenaye Rickert K Sydell Prowell-Rasch, MD 03/06/12 2355

## 2012-03-06 NOTE — ED Notes (Signed)
Pt in requesting detox from ETOH, pt states she has been on a binge over the last few months, last drink a few hours ago, also c/o bilateral hand and lower leg swelling, states she has liver failure and has not been compliant with her medication

## 2012-03-07 ENCOUNTER — Encounter (HOSPITAL_COMMUNITY): Payer: Self-pay | Admitting: Family Medicine

## 2012-03-07 DIAGNOSIS — F10239 Alcohol dependence with withdrawal, unspecified: Secondary | ICD-10-CM

## 2012-03-07 LAB — HEPATIC FUNCTION PANEL
Albumin: 3.6 g/dL (ref 3.5–5.2)
Indirect Bilirubin: 0.5 mg/dL (ref 0.3–0.9)
Total Protein: 7.3 g/dL (ref 6.0–8.3)

## 2012-03-07 LAB — PROTIME-INR
INR: 1.05 (ref 0.00–1.49)
INR: 1.08 (ref 0.00–1.49)
Prothrombin Time: 13.9 seconds (ref 11.6–15.2)
Prothrombin Time: 14.2 seconds (ref 11.6–15.2)

## 2012-03-07 LAB — BASIC METABOLIC PANEL
BUN: 7 mg/dL (ref 6–23)
Calcium: 9.1 mg/dL (ref 8.4–10.5)
GFR calc Af Amer: >90 mL/min (ref >90)
GFR calc non Af Amer: >90 mL/min (ref >90)
Glucose, Bld: 141 mg/dL — ABNORMAL HIGH (ref 70–99)
Potassium: 3.5 mEq/L (ref 3.5–5.1)
Sodium: 134 mEq/L — ABNORMAL LOW (ref 135–145)

## 2012-03-07 LAB — RAPID URINE DRUG SCREEN, HOSP PERFORMED
Amphetamines: NOT DETECTED
Benzodiazepines: NOT DETECTED
Tetrahydrocannabinol: NOT DETECTED

## 2012-03-07 LAB — CBC
Hemoglobin: 12.1 g/dL (ref 12.0–15.0)
MCH: 29 pg (ref 26.0–34.0)
MCHC: 33.7 g/dL (ref 30.0–36.0)
Platelets: 47 10*3/uL — ABNORMAL LOW (ref 150–400)

## 2012-03-07 MED ORDER — LORAZEPAM 1 MG PO TABS
1.0000 mg | ORAL_TABLET | Freq: Four times a day (QID) | ORAL | Status: AC | PRN
  Administered 2012-03-07 – 2012-03-09 (×2): 1 mg via ORAL
  Filled 2012-03-07 (×2): qty 1

## 2012-03-07 MED ORDER — HYDROCODONE-ACETAMINOPHEN 5-325 MG PO TABS
1.0000 | ORAL_TABLET | Freq: Four times a day (QID) | ORAL | Status: DC | PRN
  Administered 2012-03-07: 1 via ORAL
  Filled 2012-03-07: qty 1

## 2012-03-07 MED ORDER — CARISOPRODOL 350 MG PO TABS
350.0000 mg | ORAL_TABLET | Freq: Every day | ORAL | Status: DC
  Administered 2012-03-07 – 2012-03-10 (×4): 350 mg via ORAL
  Filled 2012-03-07 (×4): qty 1

## 2012-03-07 MED ORDER — QUETIAPINE FUMARATE 200 MG PO TABS
200.0000 mg | ORAL_TABLET | Freq: Two times a day (BID) | ORAL | Status: DC
  Administered 2012-03-08 – 2012-03-11 (×8): 200 mg via ORAL
  Filled 2012-03-07 (×8): qty 1

## 2012-03-07 MED ORDER — DOCUSATE SODIUM 100 MG PO CAPS
100.0000 mg | ORAL_CAPSULE | Freq: Two times a day (BID) | ORAL | Status: DC
  Administered 2012-03-07 – 2012-03-11 (×9): 100 mg via ORAL
  Filled 2012-03-07 (×10): qty 1

## 2012-03-07 MED ORDER — SODIUM CHLORIDE 0.9 % IJ SOLN: 3.0000 mL | INTRAMUSCULAR | Status: DC | PRN

## 2012-03-07 MED ORDER — CLONAZEPAM 0.5 MG PO TABS
0.5000 mg | ORAL_TABLET | Freq: Two times a day (BID) | ORAL | Status: DC | PRN
  Administered 2012-03-09 – 2012-03-11 (×5): 0.5 mg via ORAL
  Filled 2012-03-07 (×5): qty 1

## 2012-03-07 MED ORDER — DULOXETINE HCL 60 MG PO CPEP
60.0000 mg | ORAL_CAPSULE | Freq: Two times a day (BID) | ORAL | Status: DC
  Administered 2012-03-07 – 2012-03-10 (×6): 60 mg via ORAL
  Filled 2012-03-07 (×11): qty 1

## 2012-03-07 MED ORDER — QUETIAPINE FUMARATE 100 MG PO TABS
200.0000 mg | ORAL_TABLET | Freq: Three times a day (TID) | ORAL | Status: DC
  Administered 2012-03-07 (×2): 200 mg via ORAL
  Filled 2012-03-07 (×6): qty 4

## 2012-03-07 MED ORDER — POTASSIUM CHLORIDE CRYS ER 20 MEQ PO TBCR
40.0000 meq | EXTENDED_RELEASE_TABLET | Freq: Once | ORAL | Status: AC
  Administered 2012-03-07: 40 meq via ORAL
  Filled 2012-03-07: qty 2

## 2012-03-07 MED ORDER — VITAMIN B-1 100 MG PO TABS
100.0000 mg | ORAL_TABLET | Freq: Every day | ORAL | Status: DC
  Administered 2012-03-07 – 2012-03-11 (×5): 100 mg via ORAL
  Filled 2012-03-07 (×5): qty 1

## 2012-03-07 MED ORDER — LORAZEPAM 2 MG/ML IJ SOLN
1.0000 mg | Freq: Four times a day (QID) | INTRAMUSCULAR | Status: AC | PRN
  Filled 2012-03-07: qty 1

## 2012-03-07 MED ORDER — OXYCODONE-ACETAMINOPHEN 5-325 MG PO TABS
1.0000 | ORAL_TABLET | ORAL | Status: DC | PRN
  Administered 2012-03-07 – 2012-03-11 (×7): 1 via ORAL
  Filled 2012-03-07 (×7): qty 1

## 2012-03-07 MED ORDER — FOLIC ACID 1 MG PO TABS
1.0000 mg | ORAL_TABLET | Freq: Every day | ORAL | Status: DC
  Administered 2012-03-07 – 2012-03-11 (×5): 1 mg via ORAL
  Filled 2012-03-07 (×5): qty 1

## 2012-03-07 MED ORDER — LORAZEPAM 1 MG PO TABS
0.0000 mg | ORAL_TABLET | Freq: Four times a day (QID) | ORAL | Status: AC
  Administered 2012-03-07 (×2): 1 mg via ORAL
  Administered 2012-03-08: 2 mg via ORAL
  Administered 2012-03-08 (×2): 4 mg via ORAL
  Filled 2012-03-07: qty 2
  Filled 2012-03-07 (×2): qty 1
  Filled 2012-03-07: qty 4
  Filled 2012-03-07: qty 2
  Filled 2012-03-07: qty 4

## 2012-03-07 MED ORDER — QUETIAPINE FUMARATE 400 MG PO TABS
400.0000 mg | ORAL_TABLET | Freq: Every day | ORAL | Status: DC
  Administered 2012-03-08 – 2012-03-10 (×3): 400 mg via ORAL
  Filled 2012-03-07 (×4): qty 1

## 2012-03-07 MED ORDER — ALUM & MAG HYDROXIDE-SIMETH 200-200-20 MG/5ML PO SUSP: 30.0000 mL | Freq: Four times a day (QID) | ORAL | Status: DC | PRN

## 2012-03-07 MED ORDER — THIAMINE HCL 100 MG/ML IJ SOLN
100.0000 mg | Freq: Every day | INTRAMUSCULAR | Status: DC
  Filled 2012-03-07 (×5): qty 1

## 2012-03-07 MED ORDER — BUPROPION HCL ER (SR) 150 MG PO TB12
150.0000 mg | ORAL_TABLET | Freq: Every day | ORAL | Status: DC
  Administered 2012-03-07 – 2012-03-11 (×5): 150 mg via ORAL
  Filled 2012-03-07 (×6): qty 1

## 2012-03-07 MED ORDER — ADULT MULTIVITAMIN W/MINERALS CH
1.0000 | ORAL_TABLET | Freq: Every day | ORAL | Status: DC
  Administered 2012-03-07 – 2012-03-11 (×5): 1 via ORAL
  Filled 2012-03-07 (×5): qty 1

## 2012-03-07 MED ORDER — FUROSEMIDE 10 MG/ML IJ SOLN
20.0000 mg | Freq: Two times a day (BID) | INTRAMUSCULAR | Status: DC
  Administered 2012-03-07: 05:00:00 via INTRAVENOUS
  Administered 2012-03-07 – 2012-03-08 (×2): 20 mg via INTRAVENOUS
  Filled 2012-03-07 (×4): qty 2

## 2012-03-07 MED ORDER — NICOTINE 14 MG/24HR TD PT24
14.0000 mg | MEDICATED_PATCH | Freq: Every day | TRANSDERMAL | Status: DC
  Filled 2012-03-07 (×5): qty 1

## 2012-03-07 MED ORDER — GABAPENTIN 400 MG PO CAPS
800.0000 mg | ORAL_CAPSULE | Freq: Three times a day (TID) | ORAL | Status: DC
  Administered 2012-03-07 – 2012-03-11 (×13): 800 mg via ORAL
  Filled 2012-03-07 (×16): qty 2

## 2012-03-07 MED ORDER — LORAZEPAM 1 MG PO TABS
0.0000 mg | ORAL_TABLET | Freq: Two times a day (BID) | ORAL | Status: AC
  Administered 2012-03-09: 4 mg via ORAL
  Administered 2012-03-09 – 2012-03-10 (×2): 2 mg via ORAL
  Filled 2012-03-07: qty 4
  Filled 2012-03-07: qty 2
  Filled 2012-03-07: qty 4

## 2012-03-07 NOTE — Progress Notes (Signed)
CARE MANAGEMENT NOTE 03/07/2012  Patient:  MEERAB, MASELLI   Account Number:  [id number]  Date Initiated:  03/07/2012  Documentation initiated by:  Shyane Fossum  Subjective/Objective Assessment:   pt admitted due to etoh intoxication and withdrawl     Action/Plan:   psych or dertox   Anticipated DC Date:  03/10/2012   Anticipated [facility] Plan[facility]  In-house referral  Clinical Social Worker      DC Planning Services  NA      [facility] Choice  NA   Choice offered to / List presented to:  NA   DME arranged  NA      DME agency  NA     HH arranged  NA      HH agency  NA   Status of service:  In process, will continue to follow Medicare Important Message given?  NA - LOS <3 / Initial given by admissions (If response is "NO", the following Medicare IM given date fields will be blank) Date Medicare IM given:   Date Additional Medicare IM given:    Discharge Disposition:    Per UR Regulation:  Reviewed for med. necessity/level of care/duration of stay  If discussed at Long Length of Stay Meetings, dates discussed:    Comments:  [id number]/Vivion Romano,RN,BSN,CCM

## 2012-03-07 NOTE — Progress Notes (Signed)
Subjective: Tremulous. Complains of "Gas" and constipation.  Objective: Vital signs in last 24 hours: Temp:  [98.3 F (36.8 C)-98.5 F (36.9 C)] 98.3 F (36.8 C) (03/13 0631) Pulse Rate:  [91-98] 91  (03/13 0631) Resp:  [20] 20  (03/13 0631) BP: (137-156)/(82-100) 137/82 mmHg (03/13 0631) SpO2:  [95 %-100 %] 96 % (03/13 0631) Weight:  [116.756 kg (257 lb 6.4 oz)] 116.756 kg (257 lb 6.4 oz) (03/13 0416) Weight change:  Last BM Date: 03/06/12  Intake/Output from previous day:       Physical Exam: General: Comfortable, alert, communicative, fully oriented, tremulous, not short of breath at rest.  HEENT:  No clinical pallor, no jaundice, no conjunctival injection or discharge. Hydration appears fair. NECK:  Supple, JVP not seen, no carotid bruits, no palpable lymphadenopathy, no palpable goiter. CHEST:  Clinically clear to auscultation, no wheezes, no crackles. HEART:  Sounds 1 and 2 heard, normal, regular, no murmurs. ABDOMEN:  Full, soft, dilated superficial veins, mild-moderate clinical ascites. Old surgical scars seen. No palpable masses, normal bowel sounds. GENITALIA:  Not examined. LOWER EXTREMITIES:  Mild-moderate pitting edema, palpable peripheral pulses. MUSCULOSKELETAL SYSTEM:  Appears to have shortening of RLE. Surgical scar noted at sites of IM nail, RUE/RLE CENTRAL NERVOUS SYSTEM:  Tremulousness, otherwise, no focal neurologic deficit on gross examination.  Lab Results:  Basename 03/07/12 0531 03/06/12 2221  WBC 1.6* 1.8*  HGB 12.1 12.5  HCT 35.9* 37.6  PLT 47* 46*    Basename 03/07/12 0531 03/06/12 2221  NA 134* 134*  K 3.5 3.3*  CL 97 96  CO2 26 24  GLUCOSE 141* 115*  BUN 7 5*  CREATININE 0.79 0.89  CALCIUM 9.1 9.5   No results found for this or any previous visit (from the past 240 hour(s)).   Studies/Results: No results found.  Medications: Scheduled Meds:   . buPROPion  150 mg Oral Daily  . carisoprodol  350 mg Oral QHS  . DULoxetine  60  mg Oral BID  . folic acid  1 mg Oral Daily  . furosemide  20 mg Intravenous BID  . gabapentin  800 mg Oral TID  . LORazepam  0-4 mg Oral Q6H   Followed by  . LORazepam  0-4 mg Oral Q12H  . mulitivitamin with minerals  1 tablet Oral Daily  . nicotine  14 mg Transdermal Daily  . potassium chloride  40 mEq Oral Once  . QUEtiapine  200-400 mg Oral TID  . thiamine  100 mg Oral Daily   Or  . thiamine  100 mg Intravenous Daily   Continuous Infusions:  PRN Meds:.clonazePAM, HYDROcodone-acetaminophen, LORazepam, LORazepam, sodium chloride  Assessment/Plan:  Active Problems:  1. HYPOTHYROIDISM: Does not appear to be on replacement therapy. Will check TSH.  2. Alcohol abuse, unspecified: Acute alcohol intoxication/Chronic alcohol abuse. Patient has been counseled. Will manage with vitamin supplements and CIWA protocol   3. Alcohol withdrawal: See above.  4. HYPERTENSION, BENIGN ESSENTIAL: Reasonably controlled.  5. CIRRHOSIS, ALCOHOLIC, LIVER: Patient has evidence of decompensation, including ascites, dilated superficial veins, consistent with portal HTN, Will check Ammonia levels.  6. Bipolar 1 disorder: Continue pre-admission psychotropics.    LOS: 1 day   Unknown Flannigan,CHRISTOPHER 03/07/2012, 9:03 AM

## 2012-03-07 NOTE — ED Notes (Signed)
Pt unable to get a urine sample at this time. 

## 2012-03-07 NOTE — H&P (Signed)
PCP:    None  Chief Complaint:  Need for detox  HPI: This a 46 year old female with known history of alcohol abuse she drinks approximately 12 beers per day. The patient has for cirrhosis. She states for the past 2 weeks or so she's been getting increasing fluid overload, she believes she's put on approximately 25 pounds water weight. She states her legs hurts particular right lower extremity. She reports a history of sarcoidosis and gives a unclear history of almost losing the right lower extremity due to this. She presented with to the ER wanting to detox. Per ER physician with her thrombocytopenia and relative neutropenia patient would not be medically cleared for admission to [facility] for detox. The hospitalist service has been called with the request to admit. The patient states when she detoxes she has withdrawals which include shakes and hyperactivity. She states she did have a seizure once at the age of 56. She denies any history of vericeal bleeding or hepatic encephalopathy. Patient additionally smokes tobacco and uses marijuana.  Review of Systems: Positives bolded    anorexia, fever, weight loss,, vision loss, decreased hearing, hoarseness, chest pain, syncope, dyspnea on exertion, peripheral edema, balance deficits, hemoptysis, abdominal pain, melena, hematochezia, severe indigestion/heartburn, hematuria, incontinence, genital sores, muscle weakness, suspicious skin lesions, transient blindness, difficulty walking, depression, unusual weight change, abnormal bleeding, enlarged lymph nodes, angioedema, and breast masses.  Past Medical History: Past Medical History  Diagnosis Date  . Hypothyroidism   . Blood transfusion July 2012  . Depression   . Anxiety   . Bipolar 1 disorder   . Menorrhagia   . Liver failure    Past Surgical History  Procedure Date  . Tubal ligation   . Laparoscopy   . Orthopedic surgery   . Cholecystectomy   . Fracture surgery   . Hernia repair   . Vaginal  hysterectomy 07/27/2011    Procedure: HYSTERECTOMY VAGINAL;  Surgeon: Scheryl Darter, MD;  Location: [facility];  Service: Gynecology;  Laterality: N/A;  Vaginal Hysterectomy with Right Oophorectomy  . Laparotomy 07/28/2011    Procedure: EXPLORATORY LAPAROTOMY;  Surgeon: Tilda Burrow, MD;  Location: [facility];  Service: Gynecology;  Laterality: N/A;    Medications: Prior to Admission medications   Medication Sig Start Date End Date Taking? Authorizing Provider  buPROPion (WELLBUTRIN SR) 150 MG 12 hr tablet Take 150 mg by mouth daily.    Yes Historical Provider, MD  carisoprodol (SOMA) 350 MG tablet Take 350 mg by mouth at bedtime.     Yes Historical Provider, MD  clonazePAM (KLONOPIN) 0.5 MG tablet Take 0.5 mg by mouth 2 (two) times daily as needed. anxiety   Yes Historical Provider, MD  DULoxetine (CYMBALTA) 60 MG capsule Take 60 mg by mouth 2 (two) times daily.     Yes Historical Provider, MD  gabapentin (NEURONTIN) 800 MG tablet Take 800 mg by mouth 3 (three) times daily.     Yes Historical Provider, MD  HYDROcodone-acetaminophen (NORCO) 5-325 MG per tablet Take 1 tablet by mouth every 6 (six) hours as needed. Pain   Yes Historical Provider, MD  ibuprofen (ADVIL,MOTRIN) 200 MG tablet Take 200 mg by mouth every 6 (six) hours as needed. headache    Yes Historical Provider, MD  QUEtiapine (SEROQUEL) 400 MG tablet Take 200-400 mg by mouth 3 (three) times daily. Patient takes 200mg  at 7am and 2pm and takes 400mg  at bedtime    Yes Historical Provider, MD    Allergies:   Allergies  Allergen  Reactions  . Fluoxetine Hcl     REACTION: hyperactivity  . Metoclopramide Hcl   . Morphine And Related   . Tylenol (Acetaminophen)     Social History:  reports that she has been smoking.  She does not have any smokeless tobacco history on file. She reports that she drinks alcohol. She reports that she uses illicit drugs (Cocaine and Marijuana).  Family History: Family History  Problem Relation Age of Onset    . Diabetes Mother   . Hypertension Mother   . Diabetes Daughter   . Hypertension Daughter     Physical Exam: Filed Vitals:   03/06/12 2208  BP: 149/100  Pulse: 98  Temp: 98.5 F (36.9 C)  Resp: 20  SpO2: 100%    General:  Alert and oriented times three, obese, no acute distress Eyes: PERRLA, pink conjunctiva, no scleral icterus ENT: Moist oral mucosa, neck supple, no thyromegaly Lungs: clear to ascultation, no wheeze, no crackles, no use of accessory muscles Cardiovascular: regular rate and rhythm, no regurgitation, no gallops, no murmurs. No carotid bruits, no JVD Abdomen: soft, positive BS, non-tender, non-distended, no organomegaly, not an acute abdomen GU: not examined Neuro: CN II - XII grossly intact, sensation intact Musculoskeletal: strength 5/5 all extremities, no clubbing, cyanosis or 2+ edema bilateral lower extremities Skin: no rash, no subcutaneous crepitation, no decubitus Psych: appropriate patient   Labs on Admission:   [facility] 03/06/12 2221  NA 134*  K 3.3*  CL 96  CO2 24  GLUCOSE 115*  BUN 5*  CREATININE 0.89  CALCIUM 9.5  MG --  PHOS --    Basename 03/07/12 0005 03/06/12 2221  AST 71* 75*  ALT 30 31  ALKPHOS 166* 167*  BILITOT 0.7 0.7  PROT 7.3 7.7  ALBUMIN 3.6 3.7   No results found for this basename: LIPASE:2,AMYLASE:2 in the last 72 hours  Basename 03/06/12 2221  WBC 1.8*  NEUTROABS --  HGB 12.5  HCT 37.6  MCV 86.4  PLT 46*   No results found for this basename: CKTOTAL:3,CKMB:3,CKMBINDEX:3,TROPONINI:3 in the last 72 hours No components found with this basename: POCBNP:3 No results found for this basename: DDIMER:2 in the last 72 hours No results found for this basename: HGBA1C:2 in the last 72 hours No results found for this basename: CHOL:2,HDL:2,LDLCALC:2,TRIG:2,CHOLHDL:2,LDLDIRECT:2 in the last 72 hours No results found for this basename: TSH,T4TOTAL,FREET3,T3FREE,THYROIDAB in the last 72 hours No results found for this  basename: VITAMINB12:2,FOLATE:2,FERRITIN:2,TIBC:2,IRON:2,RETICCTPCT:2 in the last 72 hours  Micro Results: No results found for this or any previous visit (from the past 240 hour(s)).   Radiological Exams on Admission: No results found.  Assessment/Plan Present on Admission:  .Alcohol abuse/withdrawal Thrombocytopenia/neutropenia Liver cirrhosis Admit patient to MedSurg CIWA protocol ordered Consult behavioral health as patient will need ongoing help with outpatient detoxing Fluid overload Likely due to patient's liver cirrhosis By mouth Lasix   .CIRRHOSIS, ALCOHOLIC, LIVER .Bipolar 1 disorder .HYPERTENSION, BENIGN ESSENTIAL .HYPOTHYROIDISM stable resume home medication Tobacco abuse Nicotine patch   Full code SCDs for DVT prophylaxis Team 6/Dr. Derry Lory, Jere Bostrom 03/07/2012, 2:08 AM

## 2012-03-07 NOTE — Consult Note (Signed)
[facility] Consultation Note  Referring Provider: Dr. [facility])  Reason for Consultation:  Cirrhosis management  HPI: Erin [name] is a 46 y.o. female admitted for swelling and trouble breathing.  She has history of alcohol-mediated cirrhosis diagnosed 2009, with endoscopy done at that time showing no evidence of ascites.  She continues to drink alcohol, typically in binges.  No hematemesis.  Has hoarseness.  Abdominal swelling and lower extremity swelling, with recent weight gain ~ 25 lbs.     Past Medical History  Diagnosis Date  . Hypothyroidism   . Blood transfusion July 2012  . Depression   . Anxiety   . Bipolar 1 disorder   . Menorrhagia   . Liver failure     Past Surgical History  Procedure Date  . Tubal ligation   . Laparoscopy   . Orthopedic surgery   . Cholecystectomy   . Fracture surgery   . Hernia repair   . Vaginal hysterectomy 07/27/2011    Procedure: HYSTERECTOMY VAGINAL;  Surgeon: Scheryl Darter, MD;  Location: [facility];  Service: Gynecology;  Laterality: N/A;  Vaginal Hysterectomy with Right Oophorectomy  . Laparotomy 07/28/2011    Procedure: EXPLORATORY LAPAROTOMY;  Surgeon: Tilda Burrow, MD;  Location: [facility];  Service: Gynecology;  Laterality: N/A;    Prior to Admission medications   Medication Sig Start Date End Date Taking? Authorizing Provider  buPROPion (WELLBUTRIN SR) 150 MG 12 hr tablet Take 150 mg by mouth daily.    Yes Historical Provider, MD  carisoprodol (SOMA) 350 MG tablet Take 350 mg by mouth at bedtime.     Yes Historical Provider, MD  clonazePAM (KLONOPIN) 0.5 MG tablet Take 0.5 mg by mouth 2 (two) times daily as needed. anxiety   Yes Historical Provider, MD  DULoxetine (CYMBALTA) 60 MG capsule Take 60 mg by mouth 2 (two) times daily.     Yes Historical Provider, MD  gabapentin (NEURONTIN) 800 MG tablet Take 800 mg by mouth 3 (three) times daily.     Yes Historical Provider, MD  HYDROcodone-acetaminophen (NORCO) 5-325  MG per tablet Take 1 tablet by mouth every 6 (six) hours as needed. Pain   Yes Historical Provider, MD  ibuprofen (ADVIL,MOTRIN) 200 MG tablet Take 200 mg by mouth every 6 (six) hours as needed. headache    Yes Historical Provider, MD  QUEtiapine (SEROQUEL) 400 MG tablet Take 200-400 mg by mouth 3 (three) times daily. Patient takes 200mg  at 7am and 2pm and takes 400mg  at bedtime    Yes Historical Provider, MD    Current Facility-Administered Medications  Medication Dose Route Frequency Provider Last Rate Last Dose  . buPROPion (WELLBUTRIN SR) 12 hr tablet 150 mg  150 mg Oral Daily Debby Crosley, MD   150 mg at 03/07/12 1035  . carisoprodol (SOMA) tablet 350 mg  350 mg Oral QHS Debby Crosley, MD      . clonazePAM (KLONOPIN) tablet 0.5 mg  0.5 mg Oral BID PRN Debby Crosley, MD      . docusate sodium (COLACE) capsule 100 mg  100 mg Oral BID Laveda Norman, MD   100 mg at 03/07/12 1101  . DULoxetine (CYMBALTA) DR capsule 60 mg  60 mg Oral BID Debby Crosley, MD      . folic acid (FOLVITE) tablet 1 mg  1 mg Oral Daily Debby Crosley, MD   1 mg at 03/07/12 0933  . furosemide (LASIX) injection 20 mg  20 mg Intravenous BID Gery Pray, MD      .  gabapentin (NEURONTIN) capsule 800 mg  800 mg Oral TID Gery Pray, MD   800 mg at 03/07/12 1034  . LORazepam (ATIVAN) tablet 1 mg  1 mg Oral Q6H PRN Gery Pray, MD   1 mg at 03/07/12 0931   Or  . LORazepam (ATIVAN) injection 1 mg  1 mg Intravenous Q6H PRN Debby Crosley, MD      . LORazepam (ATIVAN) tablet 0-4 mg  0-4 mg Oral Q6H Debby Crosley, MD   1 mg at 03/07/12 0415   Followed by  . LORazepam (ATIVAN) tablet 0-4 mg  0-4 mg Oral Q12H Debby Crosley, MD      . mulitivitamin with minerals tablet 1 tablet  1 tablet Oral Daily Gery Pray, MD   1 tablet at 03/07/12 0932  . nicotine (NICODERM CQ - dosed in mg/24 hours) patch 14 mg  14 mg Transdermal Daily Debby Crosley, MD      . oxyCODONE-acetaminophen (PERCOCET) 5-325 MG per tablet 1 tablet  1 tablet Oral  Q4H PRN Laveda Norman, MD   1 tablet at 03/07/12 1501  . potassium chloride SA (K-DUR,KLOR-CON) CR tablet 40 mEq  40 mEq Oral Once Gery Pray, MD   40 mEq at 03/07/12 0454  . QUEtiapine (SEROQUEL) tablet 200-400 mg  200-400 mg Oral TID Gery Pray, MD   200 mg at 03/07/12 0932  . sodium chloride 0.9 % injection 3 mL  3 mL Intravenous PRN Debby Crosley, MD      . thiamine (VITAMIN B-1) tablet 100 mg  100 mg Oral Daily Debby Crosley, MD   100 mg at 03/07/12 0932   Or  . thiamine (B-1) injection 100 mg  100 mg Intravenous Daily Debby Crosley, MD      . DISCONTD: HYDROcodone-acetaminophen (NORCO) 5-325 MG per tablet 1 tablet  1 tablet Oral Q6H PRN Gery Pray, MD   1 tablet at 03/07/12 0454    Allergies as of 03/06/2012 - Review Complete 03/06/2012  Allergen Reaction Noted  . Fluoxetine hcl    . Metoclopramide hcl  12/29/2008  . Morphine and related  09/19/2011  . Tylenol (acetaminophen)  09/19/2011    Family History  Problem Relation Age of Onset  . Diabetes Mother   . Hypertension Mother   . Diabetes Daughter   . Hypertension Daughter     History   Social History  . Marital Status: Divorced    Spouse Name: N/A    Number of Children: N/A  . Years of Education: N/A   Occupational History  . Not on file.   Social History Main Topics  . Smoking status: Current Some Day Smoker  . Smokeless tobacco: Not on file  . Alcohol Use: Yes  . Drug Use: Yes    Special: Cocaine, Marijuana  . Sexually Active:    Other Topics Concern  . Not on file   Social History Narrative  . No narrative on file    Review of Systems: As per HPI.  Physical Exam: Vital signs in last 24 hours: Temp:  [98 F (36.7 C)-98.5 F (36.9 C)] 98 F (36.7 C) (03/13 1403) Pulse Rate:  [91-98] 92  (03/13 1403) Resp:  [20] 20  (03/13 1403) BP: (137-156)/(81-100) 153/81 mmHg (03/13 1403) SpO2:  [95 %-100 %] 98 % (03/13 1403) Weight:  [116.756 kg (257 lb 6.4 oz)] 116.756 kg (257 lb 6.4 oz) (03/13  0416) Last BM Date: 03/06/12 General:   Overweight, older-appearing than stated age; is in no acute distress Head:  Normocephalic and atraumatic. Eyes:  Sclera clear, no icterus.   Conjunctiva pink. Ears:  Normal auditory acuity. Nose:  No deformity, discharge,  or lesions. Mouth:  No deformity or lesions.  Oropharynx pink & moist. Neck:  Thick but supple; no masses or thyromegaly. Lungs:  Clear throughout to auscultation.   No wheezes, crackles, or rhonchi. No acute distress. Heart:  Regular rate and rhythm; no murmurs, clicks, rubs,  or gallops. Abdomen:  Protuberant, obese, marked hepatomegaly. No masses or hernias noted. Normal bowel sounds, without guarding, and without rebound.     Pulses:  Normal pulses noted. Extremities:  Without clubbing; bilateral lower extremity edema Neurologic:  Alert and  oriented x4;  Diffusely weak, but otherwise grossly normal neurologically. Skin: Multiple tattoos Cervical Nodes:  No significant cervical adenopathy. Psych:  Alert and cooperative. Normal mood and affect.   Lab Results:  [facility] 03/07/12 0531 03/06/12 2221  WBC 1.6* 1.8*  HGB 12.1 12.5  HCT 35.9* 37.6  PLT 47* 46*   BMET  Basename 03/07/12 0531 03/06/12 2221  NA 134* 134*  K 3.5 3.3*  CL 97 96  CO2 26 24  GLUCOSE 141* 115*  BUN 7 5*  CREATININE 0.79 0.89  CALCIUM 9.1 9.5   LFT  Basename 03/07/12 0005  PROT 7.3  ALBUMIN 3.6  AST 71*  ALT 30  ALKPHOS 166*  BILITOT 0.7  BILIDIR 0.2  IBILI 0.5   PT/INR  Basename 03/07/12 0531 03/07/12 0005  LABPROT 14.2 13.9  INR 1.08 1.05   Impression:  1.  Alcohol mediated cirrhosis, with complications including edema, likely ascites.  Overall, however, he has MELD score is low (7; we typically don't refer for liver transplant until MELD > 10), and he has on Childs-Pugh A class cirrhosis. 2.  Leukopenia.  Likely bone marrow suppression from alcohol. 3.  Thrombocytopenia; likely both alcohol-related bone marrow suppression  and splenic sequestration from cirrhosis. 4.  Hoarseness.  Plan:  1.  Needs alcohol rehab counseling. 2. Strict alcohol cessation; I have counseled patient on dangers of continued long-term alcohol use. 3.  Abdominal ultrasound today in progress. 4.  Patient would like chest xray for evaluation of her cough and hoarseness; I will defer this to hospitalist team. 5.  Pending ultrasound findings, we can gently titrate a diuretic regimen. 6.  Low sodium diet. 7.  Will follow; thank you for the consult.   LOS: 1 day   Daelin Haste M  03/07/2012, 4:13 PM

## 2012-03-08 ENCOUNTER — Inpatient Hospital Stay (HOSPITAL_COMMUNITY): Payer: Medicare Other

## 2012-03-08 LAB — COMPREHENSIVE METABOLIC PANEL
BUN: 11 mg/dL (ref 6–23)
CO2: 30 mEq/L (ref 19–32)
Chloride: 98 mEq/L (ref 96–112)
Creatinine, Ser: 0.78 mg/dL (ref 0.50–1.10)
GFR calc Af Amer: >90 mL/min (ref >90)
GFR calc non Af Amer: >90 mL/min (ref >90)
Total Bilirubin: 0.8 mg/dL (ref 0.3–1.2)

## 2012-03-08 LAB — VITAMIN B12: Vitamin B-12: 470 pg/mL (ref 211–911)

## 2012-03-08 MED ORDER — SPIRONOLACTONE 25 MG PO TABS
25.0000 mg | ORAL_TABLET | Freq: Every day | ORAL | Status: DC
  Administered 2012-03-08 – 2012-03-11 (×4): 25 mg via ORAL
  Filled 2012-03-08 (×4): qty 1

## 2012-03-08 MED ORDER — FUROSEMIDE 20 MG PO TABS
20.0000 mg | ORAL_TABLET | Freq: Every day | ORAL | Status: DC
  Administered 2012-03-08 – 2012-03-11 (×4): 20 mg via ORAL
  Filled 2012-03-08 (×4): qty 1

## 2012-03-08 NOTE — Progress Notes (Signed)
Ultrasound results reviewed; no ascites; no liver lesion.  Would titrate diuretics to effect, low sodium diet.  No further directed recommendations.  Will sign off; please call with questions; happy to see patient as outpatient if desired.

## 2012-03-08 NOTE — Clinical Documentation Improvement (Signed)
BMI DOCUMENTATION CLARIFICATION QUERY  THIS DOCUMENT IS NOT A PERMANENT PART OF THE MEDICAL RECORD  TO RESPOND TO THE THIS QUERY, FOLLOW THE INSTRUCTIONS BELOW:  1. If needed, update documentation for the patient's encounter via the notes activity.  2. Access this query again and click edit on the In [employer].  3. After updating, or not, click F2 to complete all highlighted (required) fields concerning your review. Select "additional documentation in the medical record" OR "no additional documentation provided".  4. Click Sign note button.  5. The deficiency will fall out of your In Basket *Please let us know if you are not able to complete this workflow by phone or e-mail (listed below).         03/08/12  Dear Dr. Brien Few Marton Redwood  In an effort to better capture your patient's severity of illness, reflect appropriate length of stay and utilization of resources, a review of the patient medical record has revealed the following indicators.    Based on your clinical judgment, please clarify and document in a progress note and/or discharge summary the clinical condition associated with the following supporting information:  In responding to this query please exercise your independent judgment.  The fact that a query is asked, does not imply that any particular answer is desired or expected.  Pt's BMI= >40  Please clarify whether or not BMI can be linked to one of the diagnoses listed below and document in pn and d/c. Thank You! [facility]: When linking BMI to a diagnosis please document both BMI and diagnosis together in pn for accuracy of SOI and ROM.   Possible Clinical conditions  Morbid Obesity W/ BMI= 40.4  Other condition___________________  Cannot Clinically determine _____________  Risk Factors: Alcohol abuse/withdrawal, Liver cirrhosis, High fall risk  Signs & Symptoms:Fluid overload, weakness, Abd & LE  Swelling with ~25 lbs wt gain,  Per consult 03/07/12:  General:  overweight   Weight:257 lbs   Height: 43ft 7in   BMI = 40.4  Treatment: Low Na diet, Daily wts. Safety precautions for high fall risk    Reviewed:  no additional documentation provided  Thank You,  Andy Gauss RN  Clinical Documentation Specialist:  Pager [phone] E-mail [email or web address]   Health Information Management 

## 2012-03-08 NOTE — Progress Notes (Signed)
Subjective: Feels better today. C/O Hoarseness for about 8 months. Has a chronic cough, occasionally productive of whitish/yellowish phlegm. Smokes about 3 cigarettes per day. States she has a history of hypothyroidism, and B12 deficiency, but has not been on medication for about a year.  Objective: Vital signs in last 24 hours: Temp:  [97.5 F (36.4 C)-98 F (36.7 C)] 97.8 F (36.6 C) (03/14 0653) Pulse Rate:  [71-92] 83  (03/14 0653) Resp:  [20] 20  (03/14 0653) BP: (119-153)/(59-81) 119/59 mmHg (03/14 0653) SpO2:  [95 %-100 %] 95 % (03/14 0653) Weight change:  Last BM Date: 03/06/12  Intake/Output from previous day:   Total I/O In: 360 [P.O.:360] Out: -    Physical Exam: General: Comfortable, alert, communicative, fully oriented, only mildly tremulous, not short of breath at rest.  HEENT:  No clinical pallor, no jaundice, no conjunctival injection or discharge. Hydration appears fair. NECK:  Supple, JVP not seen, no carotid bruits, no palpable lymphadenopathy, no palpable goiter. CHEST:  Clinically clear to auscultation, no wheezes, no crackles. HEART:  Sounds 1 and 2 heard, normal, regular, no murmurs. ABDOMEN:  Full, soft, dilated superficial veins. Old surgical scars seen. No palpable masses, normal bowel sounds. GENITALIA:  Not examined. LOWER EXTREMITIES:  Mild-moderate pitting edema, palpable peripheral pulses. MUSCULOSKELETAL SYSTEM:  Appears to have shortening of RLE. Surgical scar noted at sites of IM nail, RUE/RLE CENTRAL NERVOUS SYSTEM:  Tremulousness, otherwise, no focal neurologic deficit on gross examination.  Lab Results:  Basename 03/07/12 0531 03/06/12 2221  WBC 1.6* 1.8*  HGB 12.1 12.5  HCT 35.9* 37.6  PLT 47* 46*    Basename 03/08/12 0520 03/07/12 0531  NA 136 134*  K 3.6 3.5  CL 98 97  CO2 30 26  GLUCOSE 117* 141*  BUN 11 7  CREATININE 0.78 0.79  CALCIUM 9.1 9.1   No results found for this or any previous visit (from the past 240 hour(s)).    Studies/Results: US Abdomen Complete  03/08/2012  *RADIOLOGY REPORT*  Clinical Data:  Cirrhosis.  COMPLETE ABDOMINAL ULTRASOUND  Comparison:  08/06 and 07/28/2011  Findings:  Gallbladder:  Removed.  Common bile duct:  Normal.  7.0 mm in diameter.  Liver:  No focal lesions.  Increased echogenicity of the liver parenchyma with slight hepatomegaly.  IVC:  Normal.  Pancreas:  Normal.  Spleen:  Splenomegaly.  13.9 cm in length.  903.1 cc.  Right Kidney:  Normal.  3-8.1 cm in length.  Left Kidney:  Normal.  13.2 cm with.  Abdominal aorta:  Suboptimally visualized.  Maximal diameter 1.8 cm.  IMPRESSION: Hepatosplenomegaly.  No ascites.  Original Report Authenticated By: Gwynn Burly, M.D.    Medications: Scheduled Meds:    . buPROPion  150 mg Oral Daily  . carisoprodol  350 mg Oral QHS  . docusate sodium  100 mg Oral BID  . DULoxetine  60 mg Oral BID  . folic acid  1 mg Oral Daily  . furosemide  20 mg Intravenous BID  . gabapentin  800 mg Oral TID  . LORazepam  0-4 mg Oral Q6H   Followed by  . LORazepam  0-4 mg Oral Q12H  . mulitivitamin with minerals  1 tablet Oral Daily  . nicotine  14 mg Transdermal Daily  . QUEtiapine  200 mg Oral BID WC  . QUEtiapine  400 mg Oral QHS  . thiamine  100 mg Oral Daily   Or  . thiamine  100 mg Intravenous Daily  .  DISCONTD: QUEtiapine  200-400 mg Oral TID   Continuous Infusions:  PRN Meds:.alum & mag hydroxide-simeth, clonazePAM, LORazepam, LORazepam, oxyCODONE-acetaminophen, sodium chloride  Assessment/Plan:  Active Problems:  1. HYPOTHYROIDISM: Does not appear to be on replacement therapy. TSH is pending.  2. Alcohol abuse, unspecified: Acute alcohol intoxication. Presented with an ETOH level of 53. Patient claims she "binge drinks", but has been doing drinking alcohol for a month. Patient has been counseled. Managing with vitamin supplements and CIWA protocol, and already, withdrawal phenomena have improved.   3. Alcohol withdrawal: See  above.  4. HYPERTENSION, BENIGN ESSENTIAL: BP is controlled.  5. CIRRHOSIS, ALCOHOLIC, LIVER: NH3 is 23-49. Patient has evidence of decompensation, including dilated superficial veins, consistent with portal HTN, and recent weight gain, but abdominal U/S of 03/08/12, shows only hepatosplenomegaly. No ascites. Dr Willis Modena, has provided gastroenterology consultation. Input greatly appreciated. We shall reduce diuretic treatment and utilize low sodium diet.  6. Bipolar 1 disorder: Stable. Continue pre-admission psychotropics.  7. Dysphonia: Patient has had hoarseness for about 8 months. She has a chronic cough, occasionally productive of whitish/yellowish phlegm and smokes about 3 cigarettes per day. She also has a history of hypothyroidism, no currently on treatment. Will check CXR, TSH is pending. Patient will need ENT evaluation. This may be arranged on an outpatient basis.  8. Smoker: Patient smokes about 3 cigarettes per day. She has been counseled appropriately.  Comment: Check Vitamin B12 level.     LOS: 2 days   Bayden Gil,CHRISTOPHER 03/08/2012, 10:50 AM

## 2012-03-09 LAB — CBC
Platelets: 47 10*3/uL — ABNORMAL LOW (ref 150–400)
RDW: 15.8 % — ABNORMAL HIGH (ref 11.5–15.5)
WBC: 2.2 10*3/uL — ABNORMAL LOW (ref 4.0–10.5)

## 2012-03-09 LAB — COMPREHENSIVE METABOLIC PANEL
AST: 42 U/L — ABNORMAL HIGH (ref 0–37)
Albumin: 3.6 g/dL (ref 3.5–5.2)
Chloride: 94 mEq/L — ABNORMAL LOW (ref 96–112)
Creatinine, Ser: 0.89 mg/dL (ref 0.50–1.10)
Potassium: 3.7 mEq/L (ref 3.5–5.1)
Sodium: 134 mEq/L — ABNORMAL LOW (ref 135–145)
Total Bilirubin: 0.7 mg/dL (ref 0.3–1.2)

## 2012-03-09 NOTE — Progress Notes (Signed)
Subjective: Less tremulous today.  Objective: Vital signs in last 24 hours: Temp:  [97.5 F (36.4 C)-98 F (36.7 C)] 97.5 F (36.4 C) (03/15 0535) Pulse Rate:  [77-84] 77  (03/15 0535) Resp:  [20-21] 20  (03/15 0535) BP: (105-136)/(71-93) 105/78 mmHg (03/15 0535) SpO2:  [95 %-98 %] 95 % (03/15 0535) Weight:  [116.7 kg (257 lb 4.4 oz)] 116.7 kg (257 lb 4.4 oz) (03/15 0535) Weight change: 38.153 kg (84 lb 1.8 oz) Last BM Date: 03/06/12  Intake/Output from previous day: 03/14 0701 - 03/15 0700 In: 600 [P.O.:600] Out: -      Physical Exam: General: Comfortable, alert, communicative, fully oriented, only mildly tremulous, not short of breath at rest.  HEENT:  No clinical pallor, no jaundice, no conjunctival injection or discharge. Hydration appears fair. NECK:  Supple, JVP not seen, no carotid bruits, no palpable lymphadenopathy, no palpable goiter. CHEST:  Clinically clear to auscultation, no wheezes, no crackles. HEART:  Sounds 1 and 2 heard, normal, regular, no murmurs. ABDOMEN:  Full, soft, dilated superficial veins. Old surgical scars seen. No palpable masses, normal bowel sounds. GENITALIA:  Not examined. LOWER EXTREMITIES:  Mild pitting edema, palpable peripheral pulses. MUSCULOSKELETAL SYSTEM:  Appears to have shortening of RLE. Surgical scar noted at sites of IM nail, RUE/RLE CENTRAL NERVOUS SYSTEM:  Tremulousness, otherwise, no focal neurologic deficit on gross examination.  Lab Results:  Basename 03/09/12 0453 03/07/12 0531  WBC 2.2* 1.6*  HGB 12.5 12.1  HCT 38.4 35.9*  PLT 47* 47*    Basename 03/09/12 0453 03/08/12 0520  NA 134* 136  K 3.7 3.6  CL 94* 98  CO2 32 30  GLUCOSE 116* 117*  BUN 14 11  CREATININE 0.89 0.78  CALCIUM 9.7 9.1   No results found for this or any previous visit (from the past 240 hour(s)).   Studies/Results: Dg Chest 2 View  03/08/2012  *RADIOLOGY REPORT*  Clinical Data: Smoker with dysphonia, hoarseness and cough  CHEST - 2 VIEW   Comparison: 11/06/2008 and 07/29/2011  Findings: Slightly low lung volumes are present.  Heart size is within normal limits. Mediastinal contours are stable with mild ectasia of the thoracic aorta. This finding can be seen with hypertension.  The lung fields demonstrate some stable increased interstitial markings predominately at the lung bases compatible with underlying bronchitic change.  No new focal infiltrates or signs of congestive failure are seen.  No pleural fluid or significant peribronchial cuffing is seen.  Bony structures appear intact.  IMPRESSION: Stable cardiopulmonary appearance with no new focal or acute abnormality identified.  Original Report Authenticated By: Bertha Stakes, M.D.   US Abdomen Complete  03/08/2012  *RADIOLOGY REPORT*  Clinical Data:  Cirrhosis.  COMPLETE ABDOMINAL ULTRASOUND  Comparison:  08/06 and 07/28/2011  Findings:  Gallbladder:  Removed.  Common bile duct:  Normal.  7.0 mm in diameter.  Liver:  No focal lesions.  Increased echogenicity of the liver parenchyma with slight hepatomegaly.  IVC:  Normal.  Pancreas:  Normal.  Spleen:  Splenomegaly.  13.9 cm in length.  903.1 cc.  Right Kidney:  Normal.  3-8.1 cm in length.  Left Kidney:  Normal.  13.2 cm with.  Abdominal aorta:  Suboptimally visualized.  Maximal diameter 1.8 cm.  IMPRESSION: Hepatosplenomegaly.  No ascites.  Original Report Authenticated By: Gwynn Burly, M.D.    Medications: Scheduled Meds:    . buPROPion  150 mg Oral Daily  . carisoprodol  350 mg Oral QHS  . docusate  sodium  100 mg Oral BID  . DULoxetine  60 mg Oral BID  . folic acid  1 mg Oral Daily  . furosemide  20 mg Oral Daily  . gabapentin  800 mg Oral TID  . LORazepam  0-4 mg Oral Q6H   Followed by  . LORazepam  0-4 mg Oral Q12H  . mulitivitamin with minerals  1 tablet Oral Daily  . nicotine  14 mg Transdermal Daily  . QUEtiapine  200 mg Oral BID WC  . QUEtiapine  400 mg Oral QHS  . spironolactone  25 mg Oral Daily  .  thiamine  100 mg Oral Daily   Or  . thiamine  100 mg Intravenous Daily   Continuous Infusions:  PRN Meds:.alum & mag hydroxide-simeth, clonazePAM, LORazepam, LORazepam, oxyCODONE-acetaminophen, sodium chloride  Assessment/Plan:  Active Problems:  1. HYPOTHYROIDISM: Does not appear to be on replacement therapy. TSH is pending.  2. Alcohol abuse, unspecified: Acute alcohol intoxication. Presented with an ETOH level of 53. Patient claims she "binge drinks", but has been doing drinking alcohol for a month. Patient has been counseled. Managing with vitamin supplements and CIWA protocol. Withdrawal phenomena have steadily improved.   3. Alcohol withdrawal: See above.  4. HYPERTENSION, BENIGN ESSENTIAL: BP is controlled.  5. CIRRHOSIS, ALCOHOLIC, LIVER: NH3 is 23-49. Patient has evidence of decompensation, including dilated superficial veins, consistent with portal HTN, and recent weight gain, but abdominal U/S of 03/08/12, shows only hepatosplenomegaly. No ascites. Dr Willis Modena, has provided gastroenterology consultation. Input greatly appreciated. We shall reduce diuretic treatment and utilize low sodium diet.  6. Bipolar 1 disorder: Stable. Continue pre-admission psychotropics. Seen by psychiatrist, Dr Wonda Cerise, on 03/09/12, and recommendations are for AA, and outpatient psychiatry follow up.  7. Dysphonia: Patient has had hoarseness for about 8 months. She has a chronic cough, occasionally productive of whitish/yellowish phlegm and smokes about 3 cigarettes per day. She also has a history of hypothyroidism, not currently on treatment. CXR of 03/08/12, showed stable cardiopulmonary appearance with no new focal or acute abnormality identified. TSH is still pending. Have consulted Dr Jearld Fenton, for ENT evaluation. Will await recommendations.  8. Smoker: Patient smokes about 3 cigarettes per day. She has been counseled appropriately.  Comment: Vitamin B12 is 470.  Comment: Aiming DC next couple of  days.   LOS: 3 days   Arias Weinert,CHRISTOPHER 03/09/2012, 1:42 PM

## 2012-03-09 NOTE — Consult Note (Signed)
Reason for Consult: detox   Erin [name] is an 46 y.o. female.  HPI: This a 46 year old female with known history of alcohol abuse she drinks approximately 12 beers per day. The patient has for cirrhosis. She states for the past 2 weeks or so she's been getting increasing fluid overload, she believes she's put on approximately 25 pounds water weight. She states her legs hurts particular right lower extremity. She reports a history of sarcoidosis and gives a unclear history of almost losing the right lower extremity due to this. She presented with to the ER wanting to detox. Per ER physician with her thrombocytopenia and relative neutropenia patient would not be medically cleared for admission to [facility] for detox. The hospitalist service has been called with the request to admit. The patient states when she detoxes she has withdrawals which include shakes and hyperactivity. She states she did have a seizure once at the age of 51. She denies any history of vericeal bleeding or hepatic encephalopathy. Patient additionally smokes tobacco and uses marijuana and cocaine in past.   Reports chronic pain problems. Drinks 12 packs per day for many years. Drinker is a problem for him and it is affecting his life. Reports shakes right now and relates that the withdrawal from etoh. No psychotic symptoms. Reports getting depressed at times. No manic symptoms. Takes seroquel at home including cymbalta. wiiling t get help for drinking and drugs as out pt.   Past Medical History  Diagnosis Date  . Hypothyroidism   . Blood transfusion July 2012  . Depression   . Anxiety   . Bipolar 1 disorder   . Menorrhagia   . Liver failure     Past Surgical History  Procedure Date  . Tubal ligation   . Laparoscopy   . Orthopedic surgery   . Cholecystectomy   . Fracture surgery   . Hernia repair   . Vaginal hysterectomy 07/27/2011    Procedure: HYSTERECTOMY VAGINAL;  Surgeon: Scheryl Darter, MD;  Location: [facility];   Service: Gynecology;  Laterality: N/A;  Vaginal Hysterectomy with Right Oophorectomy  . Laparotomy 07/28/2011    Procedure: EXPLORATORY LAPAROTOMY;  Surgeon: Tilda Burrow, MD;  Location: [facility];  Service: Gynecology;  Laterality: N/A;    Family History  Problem Relation Age of Onset  . Diabetes Mother   . Hypertension Mother   . Diabetes Daughter   . Hypertension Daughter     Social History:  reports that she has been smoking.  She does not have any smokeless tobacco history on file. She reports that she drinks alcohol. She reports that she uses illicit drugs (Cocaine and Marijuana).  Allergies:  Allergies  Allergen Reactions  . Fluoxetine Hcl     REACTION: hyperactivity  . Metoclopramide Hcl   . Morphine And Related   . Tylenol (Acetaminophen)     Medications: I have reviewed the patient's current medications.  Results for orders placed during the hospital encounter of 03/06/12 (from the past 48 hour(s))  BASIC METABOLIC PANEL     Status: Abnormal   Collection Time   03/07/12  5:31 AM      Component Value Range Comment   Sodium 134 (*) 135 - 145 (mEq/L)    Potassium 3.5  3.5 - 5.1 (mEq/L)    Chloride 97  96 - 112 (mEq/L)    CO2 26  19 - 32 (mEq/L)    Glucose, Bld 141 (*) 70 - 99 (mg/dL)    BUN 7  6 -  23 (mg/dL)    Creatinine, Ser 1.61  0.50 - 1.10 (mg/dL)    Calcium 9.1  8.4 - 10.5 (mg/dL)    GFR calc non Af Amer >90  >90 (mL/min)    GFR calc Af Amer >90  >90 (mL/min)   CBC     Status: Abnormal   Collection Time   03/07/12  5:31 AM      Component Value Range Comment   WBC 1.6 (*) 4.0 - 10.5 (K/uL)    RBC 4.17  3.87 - 5.11 (MIL/uL)    Hemoglobin 12.1  12.0 - 15.0 (g/dL)    HCT 09.6 (*) 04.5 - 46.0 (%)    MCV 86.1  78.0 - 100.0 (fL)    MCH 29.0  26.0 - 34.0 (pg)    MCHC 33.7  30.0 - 36.0 (g/dL)    RDW 40.9 (*) 81.1 - 15.5 (%)    Platelets 47 (*) 150 - 400 (K/uL) CONSISTENT WITH PREVIOUS RESULT  PROTIME-INR     Status: Normal   Collection Time   03/07/12  5:31 AM        Component Value Range Comment   Prothrombin Time 14.2  11.6 - 15.2 (seconds)    INR 1.08  0.00 - 1.49    URINE RAPID DRUG SCREEN (HOSP PERFORMED)     Status: Normal   Collection Time   03/07/12  6:32 AM      Component Value Range Comment   Opiates NONE DETECTED  NONE DETECTED     Cocaine NONE DETECTED  NONE DETECTED     Benzodiazepines NONE DETECTED  NONE DETECTED     Amphetamines NONE DETECTED  NONE DETECTED     Tetrahydrocannabinol NONE DETECTED  NONE DETECTED     Barbiturates NONE DETECTED  NONE DETECTED    AMMONIA     Status: Normal   Collection Time   03/07/12  9:50 AM      Component Value Range Comment   Ammonia 53  11 - 60 (umol/L)   AFP TUMOR MARKER     Status: Normal   Collection Time   03/07/12 10:00 AM      Component Value Range Comment   AFP-Tumor Marker 2.5  0.0 - 8.0 (ng/mL)   COMPREHENSIVE METABOLIC PANEL     Status: Abnormal   Collection Time   03/08/12  5:20 AM      Component Value Range Comment   Sodium 136  135 - 145 (mEq/L)    Potassium 3.6  3.5 - 5.1 (mEq/L)    Chloride 98  96 - 112 (mEq/L)    CO2 30  19 - 32 (mEq/L)    Glucose, Bld 117 (*) 70 - 99 (mg/dL)    BUN 11  6 - 23 (mg/dL)    Creatinine, Ser 9.14  0.50 - 1.10 (mg/dL)    Calcium 9.1  8.4 - 10.5 (mg/dL)    Total Protein 7.1  6.0 - 8.3 (g/dL)    Albumin 3.3 (*) 3.5 - 5.2 (g/dL)    AST 50 (*) 0 - 37 (U/L)    ALT 26  0 - 35 (U/L)    Alkaline Phosphatase 136 (*) 39 - 117 (U/L)    Total Bilirubin 0.8  0.3 - 1.2 (mg/dL)    GFR calc non Af Amer >90  >90 (mL/min)    GFR calc Af Amer >90  >90 (mL/min)   AMMONIA     Status: Normal   Collection Time   03/08/12  5:25 AM  Component Value Range Comment   Ammonia 49  11 - 60 (umol/L)   VITAMIN B12     Status: Normal   Collection Time   03/08/12 12:25 PM      Component Value Range Comment   Vitamin B-12 470  211 - 911 (pg/mL)     Dg Chest 2 View  03/08/2012  *RADIOLOGY REPORT*  Clinical Data: Smoker with dysphonia, hoarseness and cough  CHEST -  2 VIEW  Comparison: 11/06/2008 and 07/29/2011  Findings: Slightly low lung volumes are present.  Heart size is within normal limits. Mediastinal contours are stable with mild ectasia of the thoracic aorta. This finding can be seen with hypertension.  The lung fields demonstrate some stable increased interstitial markings predominately at the lung bases compatible with underlying bronchitic change.  No new focal infiltrates or signs of congestive failure are seen.  No pleural fluid or significant peribronchial cuffing is seen.  Bony structures appear intact.  IMPRESSION: Stable cardiopulmonary appearance with no new focal or acute abnormality identified.  Original Report Authenticated By: Bertha Stakes, M.D.   US Abdomen Complete  03/08/2012  *RADIOLOGY REPORT*  Clinical Data:  Cirrhosis.  COMPLETE ABDOMINAL ULTRASOUND  Comparison:  08/06 and 07/28/2011  Findings:  Gallbladder:  Removed.  Common bile duct:  Normal.  7.0 mm in diameter.  Liver:  No focal lesions.  Increased echogenicity of the liver parenchyma with slight hepatomegaly.  IVC:  Normal.  Pancreas:  Normal.  Spleen:  Splenomegaly.  13.9 cm in length.  903.1 cc.  Right Kidney:  Normal.  3-8.1 cm in length.  Left Kidney:  Normal.  13.2 cm with.  Abdominal aorta:  Suboptimally visualized.  Maximal diameter 1.8 cm.  IMPRESSION: Hepatosplenomegaly.  No ascites.  Original Report Authenticated By: Gwynn Burly, M.D.    ROS Blood pressure 118/71, pulse 83, temperature 97.6 F (36.4 C), temperature source Oral, resp. rate 20, height 5\' 7"  (1.702 m), weight 118.1 kg (260 lb 5.8 oz), SpO2 97.00%. Physical Exam  MSE:  Appearance:  disheveled and overweight  Behavior:  cooprative Speech:  Fast at times Mood:  anxious  Affect:  normal  Thought Process:  normal  Thought Content:  normal  Sensorium:  person, place, time/date and situation  Cognition:  grossly intact  Insight:  limited  NO SI, NO HI, NO  AVH   Assessment/  Axis I: alcohol dep, cannbis abuse, cocaine abuse Axis II: deferred Axis III: see med hx Axis IV: Economic problems, problems related to the social environment, problems with the primary support group Axis V: 55   Plan:   1. recommend drug rehab and AA after detox  2. Recommend out psy follow up to adjust his meds as needed  3. Will follow on 3/16 if needed  4. continue current psychotrpics at this [facility] [place] D[place] 03/09/2012, 1:07 AM

## 2012-03-09 NOTE — Progress Notes (Signed)
CSW met with Pt re: current admission.  Pt reports that she's had significant health pxs related to her hysterectomy in August.  She states that she was not given the proper care post-op and that she started binge drinking approx 2 months ago.  Prior to this, Pt states that she had been "semi-sober," with only consuming a small amount of ETOH socially.  Pt reports that her binge drinking consisted of 1/2 of a 12-pack, a couple glasses of wine and marijuana use daily.    Pt states that she's been in numerous tx facility, including [facility].  She reports that the best outcome she had was after her most recent stint in [facility], approx 1-1/2 years ago.  Pt reports that she's been on disability for the past 10 years due to medical and mental health pxs.  Pt reports that she's been dx'd with Bipolar and that she suffers from depression and anxiety.    Pt reports that she's attempted suicide 2x, once by slitting her wrist at 46 years old and once by OD in early teens.  Pt states that she has no intentions of ever attempting suicide again because she won't go to heaven if she does.  Pt denied current SI, HI, AVH, paranoia or delusions.  Pt lives alone.  Pt has been married 4x.  3 of the 4 husbands are deceased; the 4th's whereabouts are unknown.  Pt has 3 adult children and 2 grandchildren, ages 84 and 1.  Pt wants to go to [facility].  CSW discussed psych MD's recommendations.  Pt states that she doesn't think she was seen by psych, as the MD that evaluated her didn't discuss any psych issues with her.  CSW and Pt discussed the option for Pt to go to present to [facility] upon d/c for an Ax to determine if inpt tx is warranted.  Pt understands this and would like to speak to psych MD while in [facility] re: her mental health.  CSW to notify psych.  CSW thanked Pt for her time.  Apprised Dr. Lolly Mustache of the situation.  Dr. Lolly Mustache suggested that CSW contact [facility] and have Pt put on the list for f/u tomorrow.  Notified [facility].  Pt put on  the consult list for f/u tomorrow.  Providence Crosby, LCSWA Clinical Social Work [phone]

## 2012-03-10 LAB — COMPREHENSIVE METABOLIC PANEL
ALT: 23 U/L (ref 0–35)
AST: 35 U/L (ref 0–37)
Albumin: 3.5 g/dL (ref 3.5–5.2)
BUN: 19 mg/dL (ref 6–23)
GFR calc non Af Amer: 72 mL/min — ABNORMAL LOW (ref >90)
Glucose, Bld: 100 mg/dL — ABNORMAL HIGH (ref 70–99)
Potassium: 3.9 mEq/L (ref 3.5–5.1)
Total Protein: 7.1 g/dL (ref 6.0–8.3)

## 2012-03-10 MED ORDER — MOXIFLOXACIN HCL 400 MG PO TABS
400.0000 mg | ORAL_TABLET | Freq: Every day | ORAL | Status: DC
  Administered 2012-03-10: 400 mg via ORAL
  Filled 2012-03-10 (×2): qty 1

## 2012-03-10 NOTE — Discharge Summary (Addendum)
Physician Discharge Summary  Patient ID: Erin [name] MRN: [id number] DOB/AGE: [birth year] 46 y.o.  Admit date: 03/06/2012 Discharge date: 03/11/2012  Primary Care Physician:  No primary provider on file.   Discharge Diagnoses:    Patient Active Problem List  Diagnoses  . SARCOIDOSIS  . HYPOTHYROIDISM  . ANEMIA DUE TO CHRONIC BLOOD LOSS  . PANCYTOPENIA  . DISORDER, BIPOLAR NOS  . Alcohol abuse, unspecified  . TOBACCO DEPENDENCE  . ABUSE, OTHER/MIXED/UNSPECIFIED DRUG, UNSPC  . DEPRESSION  . CHRONIC PAIN DUE TO TRAUMA  . SWELLING OR MASS OF EYE  . HYPERTENSION, BENIGN ESSENTIAL  . NEPHROSCLEROSIS  . EXTERNAL HEMORRHOIDS  . HEMORRHOIDS  . ESOPHAGEAL VARICES  . ESOPHAGITIS, REFLUX  . DISORDER, ESOPHAGEAL NEC  . ULCER, ACUTE GASTRIC W/O HEM/PERF W/O OBST  . VENTRAL HERNIA  . HIATAL HERNIA  . CIRRHOSIS, ALCOHOLIC, LIVER  . ALCOHOLIC LIVER DISEASE  . PORTAL HYPERTENSION  . KNEE PAIN, LEFT  . FOOT PAIN, RIGHT  . INSOMNIA  . HOARSENESS  . HYPERURICEMIA  . HX, PERSONAL, SCHIZOPHRENIA  . RECTAL BLEEDING, HX OF  . Menorrhagia  . Bipolar 1 disorder  . Alcohol withdrawal    Medication List  As of 03/11/2012  1:33 PM   TAKE these medications         buPROPion 150 MG 12 hr tablet   Commonly known as: WELLBUTRIN SR   Take 150 mg by mouth daily.      carisoprodol 350 MG tablet   Commonly known as: SOMA   Take 350 mg by mouth at bedtime.      clonazePAM 0.5 MG tablet   Commonly known as: KLONOPIN   Take 0.5 mg by mouth 2 (two) times daily as needed. anxiety      DULoxetine 60 MG capsule   Commonly known as: CYMBALTA   Take 60 mg by mouth 2 (two) times daily.      folic acid 1 MG tablet   Commonly known as: FOLVITE   Take 1 tablet (1 mg total) by mouth daily.      furosemide 20 MG tablet   Commonly known as: LASIX   Take 1 tablet (20 mg total) by mouth daily.      gabapentin 800 MG tablet   Commonly known as: NEURONTIN   Take 800 mg by mouth 3 (three) times  daily.      HYDROcodone-acetaminophen 5-325 MG per tablet   Commonly known as: NORCO   Take 1 tablet by mouth every 6 (six) hours as needed. Pain      ibuprofen 200 MG tablet   Commonly known as: ADVIL,MOTRIN   Take 200 mg by mouth every 6 (six) hours as needed. headache      moxifloxacin 400 MG tablet   Commonly known as: AVELOX   Take 1 tablet (400 mg total) by mouth daily at 6 PM.      mulitivitamin with minerals Tabs   Take 1 tablet by mouth daily.      QUEtiapine 400 MG tablet   Commonly known as: SEROQUEL   Take 200-400 mg by mouth 3 (three) times daily. Patient takes 200mg  at 7am and 2pm and takes 400mg  at bedtime      spironolactone 25 MG tablet   Commonly known as: ALDACTONE   Take 1 tablet (25 mg total) by mouth daily.      thiamine 100 MG tablet   Take 1 tablet (100 mg total) by mouth daily.  Disposition and Follow-up:  Follow up with psychiatrist, with ENT, and with GI  Consults:  GI and psychiatry  Dr Willis Modena, gastroenterologist. Dr Jearld Fenton, ENT (telephone discussion) Dr Wonda Cerise, psychiatrist.  Significant Diagnostic Studies:  No results found.  Brief H and P: For complete details, refer to admission H and P. However, in brief, this a 46 year old female, with known history of alcohol abuse, approximately 12 beers per day, alcoholic cirrhosis, smoking history, marijuana use, bipolar disorder, presenting with progressive weight gain of about 25 pounds over 2 weeks. She presented with to the ER wanting to "detox". She was admitted for further evaluation, investigation and management.  Physical Exam: On 03/11/12. General: Comfortable, alert, communicative, fully oriented, only slight tremor. Not short of breath at rest.  HEENT: No clinical pallor, no jaundice, no conjunctival injection or discharge. Hydration appears fair.  NECK: Supple, JVP not seen, no carotid bruits, no palpable lymphadenopathy, no palpable goiter.  CHEST: Clinically  clear to auscultation, no wheezes, no crackles.  HEART: Sounds 1 and 2 heard, normal, regular, no murmurs.  ABDOMEN: Full, soft, dilated superficial veins. Old surgical scars seen. No palpable masses, normal bowel sounds.  GENITALIA: Not examined.  LOWER EXTREMITIES: Mild pitting edema, palpable peripheral pulses.  MUSCULOSKELETAL SYSTEM: Appears to have shortening of RLE. Surgical scar noted at sites of IM nail, RUE/RLE  CENTRAL NERVOUS SYSTEM: Tremulousness, otherwise, no focal neurologic deficit on gross examination.   Hospital Course:  Active Problems:  1. HYPOTHYROIDISM: This is per history, provided by patient. According to her, she has been off replacement therapy, for over a year. TSH was 0.515.  2. Alcohol abuse, unspecified: Acute alcohol intoxication. Patient presented as described above, and was found to have an ETOH level of 53, as well as tremulousness. Patient claims she "binge drinks", but has been drinking alcohol for a month. Patient has been counseled, and was managed with vitamin supplements and CIWA protocol. Withdrawal phenomena had resolved as of 03/10/12.  3. Alcohol withdrawal: See above.  4. HYPERTENSION, BENIGN ESSENTIAL: BP remained controlled throughout this hospitalization, on Lasix/Spironolactone..  5. CIRRHOSIS, ALCOHOLIC, LIVER: Patient has a known history of alcoholic cirrhosis, and ammonia level was between  23-49. Patient has some evidence of decompensation, including dilated superficial veins, consistent with portal HTN, and recent weight gain, but abdominal U/S of 03/08/12, showed only hepatosplenomegaly. No ascites. Dr Willis Modena, has provided gastroenterology consultation, and was helpful in optimizing diuretic treatment and advocating low sodium diet, to good effect.  6. Bipolar 1 disorder: Patient remained stable from this viewpoint, on pre-admission psychotropics. She was seen by psychiatrist, Dr Wonda Cerise, on 03/09/12-03/10/12, and recommendations are for  AA, and outpatient psychiatry follow up.  7. Dysphonia: Patient has had hoarseness for about 8 months. She has a chronic cough, occasionally productive of whitish/yellowish phlegm and smokes about 3 cigarettes per day. She also has a questionable history of hypothyroidism, not currently on treatment. CXR of 03/08/12, showed stable cardiopulmonary appearance with no new focal or acute abnormality identified. As phlegm was yellow/green on 03/10/12, we commenced a 7-day course of Avelox, to be completed on 03/16/12. Have discussed with Dr Jearld Fenton via telephone, on 03/09/12, for ENT evaluation, and he has kindly scheduled an appointment for patient to see him in the office at [facility], on Tuesday, 03/13/12, at 3:30 PM..  8. Smoker: Patient smokes about 3 cigarettes per day. She has been counseled appropriately.    Comment: Patient is stable for discharge on 03/11/12.  Time spent on  Discharge: 45 mins.  Signed: Mahli Glahn,CHRISTOPHER 03/11/2012, 1:33 PM

## 2012-03-10 NOTE — Progress Notes (Signed)
Subjective: Steady improvement.  Objective: Vital signs in last 24 hours: Temp:  [97.4 F (36.3 C)-97.7 F (36.5 C)] 97.6 F (36.4 C) (03/16 0656) Pulse Rate:  [70-87] 75  (03/16 0656) Resp:  [20] 20  (03/16 0656) BP: (106-121)/(72-80) 116/80 mmHg (03/16 0656) SpO2:  [90 %-97 %] 95 % (03/16 0656) Weight:  [116.7 kg (257 lb 4.4 oz)] 116.7 kg (257 lb 4.4 oz) (03/16 0656) Weight change: -1.4 kg (-3 lb 1.4 oz) Last BM Date: 03/06/12  Intake/Output from previous day:       Physical Exam: General: Comfortable, alert, communicative, fully oriented, only slight tremor. Not short of breath at rest.  HEENT:  No clinical pallor, no jaundice, no conjunctival injection or discharge. Hydration appears fair. NECK:  Supple, JVP not seen, no carotid bruits, no palpable lymphadenopathy, no palpable goiter. CHEST:  Clinically clear to auscultation, no wheezes, no crackles. HEART:  Sounds 1 and 2 heard, normal, regular, no murmurs. ABDOMEN:  Full, soft, dilated superficial veins. Old surgical scars seen. No palpable masses, normal bowel sounds. GENITALIA:  Not examined. LOWER EXTREMITIES:  Mild pitting edema, palpable peripheral pulses. MUSCULOSKELETAL SYSTEM:  Appears to have shortening of RLE. Surgical scar noted at sites of IM nail, RUE/RLE CENTRAL NERVOUS SYSTEM:  Tremulousness, otherwise, no focal neurologic deficit on gross examination.  Lab Results:  [facility] 03/09/12 0453  WBC 2.2*  HGB 12.5  HCT 38.4  PLT 47*    Basename 03/10/12 0550 03/09/12 0453  NA 133* 134*  K 3.9 3.7  CL 95* 94*  CO2 30 32  GLUCOSE 100* 116*  BUN 19 14  CREATININE 0.93 0.89  CALCIUM 10.0 9.7   No results found for this or any previous visit (from the past 240 hour(s)).   Studies/Results: No results found.  Medications: Scheduled Meds:    . buPROPion  150 mg Oral Daily  . carisoprodol  350 mg Oral QHS  . docusate sodium  100 mg Oral BID  . DULoxetine  60 mg Oral BID  . folic acid  1 mg Oral  Daily  . furosemide  20 mg Oral Daily  . gabapentin  800 mg Oral TID  . LORazepam  0-4 mg Oral Q12H  . moxifloxacin  400 mg Oral q1800  . mulitivitamin with minerals  1 tablet Oral Daily  . nicotine  14 mg Transdermal Daily  . QUEtiapine  200 mg Oral BID WC  . QUEtiapine  400 mg Oral QHS  . spironolactone  25 mg Oral Daily  . thiamine  100 mg Oral Daily   Or  . thiamine  100 mg Intravenous Daily   Continuous Infusions:  PRN Meds:.alum & mag hydroxide-simeth, clonazePAM, LORazepam, LORazepam, oxyCODONE-acetaminophen, sodium chloride  Assessment/Plan:  Active Problems:  1. HYPOTHYROIDISM: Does not appear to be on replacement therapy. TSH is pending.  2. Alcohol abuse, unspecified: Acute alcohol intoxication. Presented with an ETOH level of 53. Patient claims she "binge drinks", but has been doing drinking alcohol for a month. Patient has been counseled. Managing with vitamin supplements and CIWA protocol. Withdrawal phenomena have steadily improved.   3. Alcohol withdrawal: See above.  4. HYPERTENSION, BENIGN ESSENTIAL: BP is controlled.  5. CIRRHOSIS, ALCOHOLIC, LIVER: NH3 is 23-49. Patient has some evidence of decompensation, including dilated superficial veins, consistent with portal HTN, and recent weight gain, but abdominal U/S of 03/08/12, shows only hepatosplenomegaly. No ascites. Dr Willis Modena, has provided gastroenterology consultation. Input greatly appreciated. We have reduced diuretic treatment and are utilizing low sodium  diet, to good effect.  6. Bipolar 1 disorder: Stable. Continue pre-admission psychotropics. Seen by psychiatrist, Dr Wonda Cerise, on 03/09/12, and recommendations are for AA, and outpatient psychiatry follow up. Psych plans to see again, today.  7. Dysphonia: Patient has had hoarseness for about 8 months. She has a chronic cough, occasionally productive of whitish/yellowish phlegm and smokes about 3 cigarettes per day. She also has a history of hypothyroidism,  not currently on treatment. CXR of 03/08/12, showed stable cardiopulmonary appearance with no new focal or acute abnormality identified. TSH is still pending. As phlegm is yellow/green today, will treat with a 7-day course of Avelox. Have discussed with Dr Jearld Fenton, for ENT evaluation, and he has kindly scheduled an appointment for patient to see him in the office at [facility], on Tuesday, 03/13/12, at 3:30 PM..  8. Smoker: Patient smokes about 3 cigarettes per day. She has been counseled appropriately.  Comment: Vitamin B12 is 470.  Comment: Aiming DC on 03/11/12, unless psych recommends [facility].   LOS: 4 days   Erin [name],CHRISTOPHER 03/10/2012, 12:40 PM

## 2012-03-10 NOTE — Consult Note (Signed)
Reason for Consult: detox   Erin [name] is an 46 y.o. female. Reports chronic pain problems. Drinks 12 packs per day for many years. Drinker is a problem for him and it is affecting his life. Reports shakes right now and relates that the withdrawal from etoh. No psychotic symptoms. Reports getting depressed at times. No manic symptoms. Takes seroquel at home including cymbalta. wiiling t get help for drinking and drugs as out pt.   Interval Hx:  Continues to do well. Plan to stop drinking. No new acute issues. Thinks Seroquel is helping her thoughts and mood. Able to sleep and eat.    Past Medical History  Diagnosis Date  . Hypothyroidism   . Blood transfusion July 2012  . Depression   . Anxiety   . Bipolar 1 disorder   . Menorrhagia   . Liver failure     Past Surgical History  Procedure Date  . Tubal ligation   . Laparoscopy   . Orthopedic surgery   . Cholecystectomy   . Fracture surgery   . Hernia repair   . Vaginal hysterectomy 07/27/2011    Procedure: HYSTERECTOMY VAGINAL;  Surgeon: Scheryl Darter, MD;  Location: [facility];  Service: Gynecology;  Laterality: N/A;  Vaginal Hysterectomy with Right Oophorectomy  . Laparotomy 07/28/2011    Procedure: EXPLORATORY LAPAROTOMY;  Surgeon: Tilda Burrow, MD;  Location: [facility];  Service: Gynecology;  Laterality: N/A;    Family History  Problem Relation Age of Onset  . Diabetes Mother   . Hypertension Mother   . Diabetes Daughter   . Hypertension Daughter     Social History:  reports that she has been smoking.  She does not have any smokeless tobacco history on file. She reports that she drinks alcohol. She reports that she uses illicit drugs (Cocaine and Marijuana).  Allergies:  Allergies  Allergen Reactions  . Fluoxetine Hcl     REACTION: hyperactivity  . Metoclopramide Hcl   . Morphine And Related   . Tylenol (Acetaminophen)     Medications: I have reviewed the patient's current medications.  Results for orders  placed during the hospital encounter of 03/06/12 (from the past 48 hour(s))  CBC     Status: Abnormal   Collection Time   03/09/12  4:53 AM      Component Value Range Comment   WBC 2.2 (*) 4.0 - 10.5 (K/uL)    RBC 4.35  3.87 - 5.11 (MIL/uL)    Hemoglobin 12.5  12.0 - 15.0 (g/dL)    HCT 11.9  14.7 - 82.9 (%)    MCV 88.3  78.0 - 100.0 (fL)    MCH 28.7  26.0 - 34.0 (pg)    MCHC 32.6  30.0 - 36.0 (g/dL)    RDW 56.2 (*) 13.0 - 15.5 (%)    Platelets 47 (*) 150 - 400 (K/uL) CONSISTENT WITH PREVIOUS RESULT  COMPREHENSIVE METABOLIC PANEL     Status: Abnormal   Collection Time   03/09/12  4:53 AM      Component Value Range Comment   Sodium 134 (*) 135 - 145 (mEq/L)    Potassium 3.7  3.5 - 5.1 (mEq/L)    Chloride 94 (*) 96 - 112 (mEq/L)    CO2 32  19 - 32 (mEq/L)    Glucose, Bld 116 (*) 70 - 99 (mg/dL)    BUN 14  6 - 23 (mg/dL)    Creatinine, Ser 8.65  0.50 - 1.10 (mg/dL)    Calcium 9.7  8.4 - 10.5 (mg/dL)    Total Protein 7.6  6.0 - 8.3 (g/dL)    Albumin 3.6  3.5 - 5.2 (g/dL)    AST 42 (*) 0 - 37 (U/L)    ALT 25  0 - 35 (U/L)    Alkaline Phosphatase 139 (*) 39 - 117 (U/L)    Total Bilirubin 0.7  0.3 - 1.2 (mg/dL)    GFR calc non Af Amer 76 (*) >90 (mL/min)    GFR calc Af Amer 88 (*) >90 (mL/min)   COMPREHENSIVE METABOLIC PANEL     Status: Abnormal   Collection Time   03/10/12  5:50 AM      Component Value Range Comment   Sodium 133 (*) 135 - 145 (mEq/L)    Potassium 3.9  3.5 - 5.1 (mEq/L)    Chloride 95 (*) 96 - 112 (mEq/L)    CO2 30  19 - 32 (mEq/L)    Glucose, Bld 100 (*) 70 - 99 (mg/dL)    BUN 19  6 - 23 (mg/dL)    Creatinine, Ser 1.61  0.50 - 1.10 (mg/dL)    Calcium 09.6  8.4 - 10.5 (mg/dL)    Total Protein 7.1  6.0 - 8.3 (g/dL)    Albumin 3.5  3.5 - 5.2 (g/dL)    AST 35  0 - 37 (U/L)    ALT 23  0 - 35 (U/L)    Alkaline Phosphatase 136 (*) 39 - 117 (U/L)    Total Bilirubin 0.6  0.3 - 1.2 (mg/dL)    GFR calc non Af Amer 72 (*) >90 (mL/min)    GFR calc Af Amer 84 (*) >90  (mL/min)   TSH     Status: Normal   Collection Time   03/10/12  1:30 PM      Component Value Range Comment   TSH 0.515  0.350 - 4.500 (uIU/mL)     No results found.  ROS  Blood pressure 127/82, pulse 69, temperature 96 F (35.6 C), temperature source Oral, resp. rate 20, height 5\' 7"  (1.702 m), weight 116.7 kg (257 lb 4.4 oz), SpO2 97.00%. Physical Exam   MSE:  Appearance:  disheveled and overweight  Behavior:  cooprative Speech:  Fast at times Mood: ok Affect:  normal  Thought Process:  normal  Thought Content:  normal  Sensorium:  person, place, time/date and situation  Cognition:  grossly intact  Insight: fair NO SI, NO HI, NO AVH   Assessment/  Axis I: alcohol dep, cannbis abuse, cocaine abuse Axis II: deferred Axis III: see med hx Axis IV: Economic problems, problems related to the social environment, problems with the primary support group Axis V: 55   Plan:  1. continue current seroquel dose at this time. Pt is able to tolerate and getting benefits  2. Will continue to follow  Wonda Cerise 03/10/2012, 10:56 PM

## 2012-03-11 LAB — DIFFERENTIAL
Basophils Relative: 1 % (ref 0–1)
Eosinophils Relative: 3 % (ref 0–5)
Lymphs Abs: 0.8 10*3/uL (ref 0.7–4.0)
Monocytes Absolute: 0.2 10*3/uL (ref 0.1–1.0)
Monocytes Relative: 12 % (ref 3–12)
Neutro Abs: 0.8 10*3/uL — ABNORMAL LOW (ref 1.7–7.7)

## 2012-03-11 LAB — BASIC METABOLIC PANEL
BUN: 21 mg/dL (ref 6–23)
CO2: 31 mEq/L (ref 19–32)
Calcium: 10 mg/dL (ref 8.4–10.5)
Chloride: 94 mEq/L — ABNORMAL LOW (ref 96–112)
Creatinine, Ser: 0.99 mg/dL (ref 0.50–1.10)
Glucose, Bld: 111 mg/dL — ABNORMAL HIGH (ref 70–99)

## 2012-03-11 LAB — CBC
HCT: 39.7 % (ref 36.0–46.0)
MCH: 28.7 pg (ref 26.0–34.0)
MCV: 89.6 fL (ref 78.0–100.0)
RBC: 4.43 MIL/uL (ref 3.87–5.11)
WBC: 1.9 10*3/uL — ABNORMAL LOW (ref 4.0–10.5)

## 2012-03-11 MED ORDER — ADULT MULTIVITAMIN W/MINERALS CH: 1.0000 | ORAL_TABLET | Freq: Every day | ORAL | Status: DC

## 2012-03-11 MED ORDER — SPIRONOLACTONE 25 MG PO TABS: 25.0000 mg | ORAL_TABLET | Freq: Every day | ORAL | Status: DC

## 2012-03-11 MED ORDER — FLEET ENEMA 7-19 GM/118ML RE ENEM
1.0000 | ENEMA | Freq: Once | RECTAL | Status: AC
  Administered 2012-03-11: 1 via RECTAL
  Filled 2012-03-11: qty 1

## 2012-03-11 MED ORDER — THIAMINE HCL 100 MG PO TABS: 100.0000 mg | ORAL_TABLET | Freq: Every day | ORAL | Status: DC

## 2012-03-11 MED ORDER — FOLIC ACID 1 MG PO TABS: 1.0000 mg | ORAL_TABLET | Freq: Every day | ORAL | Status: DC

## 2012-03-11 MED ORDER — MOXIFLOXACIN HCL 400 MG PO TABS: 400.0000 mg | ORAL_TABLET | Freq: Every day | ORAL | Status: AC

## 2012-03-11 MED ORDER — FUROSEMIDE 20 MG PO TABS: 20.0000 mg | ORAL_TABLET | Freq: Every day | ORAL | Status: DC

## 2012-03-11 NOTE — Progress Notes (Signed)
She is d/c'd at this time after a lengthy information session from Dr. Brien Few and myself regarding meds, treatments and need for follow up.  She verbalizes understanding and signs her d/c instructions.  She ambulates without difficulty.

## 2012-03-11 NOTE — Discharge Instructions (Signed)
Follow up with outpatient Psychiatry, and with AA, as instructed.

## 2012-03-12 LAB — PATHOLOGIST SMEAR REVIEW

## 2012-04-02 ENCOUNTER — Other Ambulatory Visit: Payer: Self-pay | Admitting: Otolaryngology

## 2012-04-02 DIAGNOSIS — R1310 Dysphagia, unspecified: Secondary | ICD-10-CM

## 2012-07-12 ENCOUNTER — Encounter (HOSPITAL_COMMUNITY): Payer: Self-pay | Admitting: *Deleted

## 2012-07-12 ENCOUNTER — Emergency Department (HOSPITAL_COMMUNITY)
Admission: EM | Admit: 2012-07-12 | Discharge: 2012-07-13 | Disposition: A | Discharge status: Home / Self Care | Payer: Medicare Other | Attending: Emergency Medicine | Admitting: Emergency Medicine

## 2012-07-12 ENCOUNTER — Emergency Department (HOSPITAL_COMMUNITY): Payer: Medicare Other

## 2012-07-12 DIAGNOSIS — F319 Bipolar disorder, unspecified: Secondary | ICD-10-CM | POA: Insufficient documentation

## 2012-07-12 DIAGNOSIS — K7460 Unspecified cirrhosis of liver: Secondary | ICD-10-CM | POA: Insufficient documentation

## 2012-07-12 DIAGNOSIS — E039 Hypothyroidism, unspecified: Secondary | ICD-10-CM | POA: Insufficient documentation

## 2012-07-12 DIAGNOSIS — E876 Hypokalemia: Secondary | ICD-10-CM | POA: Insufficient documentation

## 2012-07-12 DIAGNOSIS — I8510 Secondary esophageal varices without bleeding: Secondary | ICD-10-CM | POA: Insufficient documentation

## 2012-07-12 DIAGNOSIS — K766 Portal hypertension: Secondary | ICD-10-CM | POA: Insufficient documentation

## 2012-07-12 DIAGNOSIS — F411 Generalized anxiety disorder: Secondary | ICD-10-CM | POA: Insufficient documentation

## 2012-07-12 DIAGNOSIS — W19XXXA Unspecified fall, initial encounter: Secondary | ICD-10-CM | POA: Insufficient documentation

## 2012-07-12 DIAGNOSIS — R0789 Other chest pain: Secondary | ICD-10-CM | POA: Insufficient documentation

## 2012-07-12 DIAGNOSIS — F17200 Nicotine dependence, unspecified, uncomplicated: Secondary | ICD-10-CM | POA: Insufficient documentation

## 2012-07-12 DIAGNOSIS — R109 Unspecified abdominal pain: Secondary | ICD-10-CM | POA: Insufficient documentation

## 2012-07-12 DIAGNOSIS — S20219A Contusion of unspecified front wall of thorax, initial encounter: Secondary | ICD-10-CM

## 2012-07-12 NOTE — ED Notes (Addendum)
Patient fell yesterday hurting her left side.  Now c/o left rib pain and also has been complaining of cough for sometime.  Patient also c/o her lip oozing blood for about 7 months.  No blood noticed at this time.  Patient also complains of vomiting blood this am

## 2012-07-13 ENCOUNTER — Emergency Department (HOSPITAL_COMMUNITY): Payer: Medicare Other

## 2012-07-13 ENCOUNTER — Encounter (HOSPITAL_COMMUNITY): Payer: Self-pay | Admitting: Radiology

## 2012-07-13 LAB — POCT I-STAT, CHEM 8
BUN: 4 mg/dL — ABNORMAL LOW (ref 6–23)
Chloride: 98 mEq/L (ref 96–112)
Creatinine, Ser: 0.8 mg/dL (ref 0.50–1.10)
Glucose, Bld: 130 mg/dL — ABNORMAL HIGH (ref 70–99)
Hemoglobin: 12.6 g/dL (ref 12.0–15.0)
Potassium: 3.2 mEq/L — ABNORMAL LOW (ref 3.5–5.1)

## 2012-07-13 MED ORDER — IBUPROFEN 800 MG PO TABS: 800.0000 mg | ORAL_TABLET | Freq: Three times a day (TID) | ORAL | Status: AC

## 2012-07-13 MED ORDER — POTASSIUM CHLORIDE CRYS ER 20 MEQ PO TBCR
40.0000 meq | EXTENDED_RELEASE_TABLET | Freq: Once | ORAL | Status: AC
  Administered 2012-07-13: 40 meq via ORAL
  Filled 2012-07-13: qty 2

## 2012-07-13 MED ORDER — SODIUM CHLORIDE 0.9 % IV SOLN
INTRAVENOUS | Status: DC
  Administered 2012-07-13: via INTRAVENOUS

## 2012-07-13 MED ORDER — ONDANSETRON HCL 4 MG/2ML IJ SOLN
4.0000 mg | Freq: Once | INTRAMUSCULAR | Status: AC
  Administered 2012-07-13: 4 mg via INTRAVENOUS
  Filled 2012-07-13: qty 2

## 2012-07-13 MED ORDER — HYDROMORPHONE HCL PF 1 MG/ML IJ SOLN
1.0000 mg | Freq: Once | INTRAMUSCULAR | Status: AC
  Administered 2012-07-13: 1 mg via INTRAVENOUS
  Filled 2012-07-13: qty 1

## 2012-07-13 MED ORDER — IOHEXOL 300 MG/ML  SOLN
100.0000 mL | Freq: Once | INTRAMUSCULAR | Status: AC | PRN
  Administered 2012-07-13: 100 mL via INTRAVENOUS

## 2012-07-13 NOTE — ED Notes (Signed)
Patient transported to CT 

## 2012-07-13 NOTE — ED Provider Notes (Signed)
History     CSN: [id number]  Arrival date & time 07/12/12  1478   First MD Initiated Contact with Patient 07/12/12 2355      Chief Complaint  Patient presents with  . Rib Injury    (Consider location/radiation/quality/duration/timing/severity/associated sxs/prior treatment) HPI Hx provided by patient. Fell 4 days ago landing on her left side injuring her left ribs. States she was evaluated with an x-ray at an urgent care at that time and told she had a cracked rib. Hurts to breathe. Sharp in quality and not radiating. Pain is located left lower ribs and left upper quadrant of abdomen. She states pain is worse since she was initially evaluated presents here for further evaluation. States she has not been taking any medications for this. She is prescribed soma and Klonopin and Norco and Neurontin. She denies any hallucinations does have psychiatric history. No fevers or chills. No syncope. Moderate in severity. No known alleviating factors. Past Medical History  Diagnosis Date  . Hypothyroidism   . Blood transfusion July 2012  . Depression   . Anxiety   . Bipolar 1 disorder   . Menorrhagia   . Liver failure     Past Surgical History  Procedure Date  . Tubal ligation   . Laparoscopy   . Orthopedic surgery   . Cholecystectomy   . Fracture surgery   . Hernia repair   . Vaginal hysterectomy 07/27/2011    Procedure: HYSTERECTOMY VAGINAL;  Surgeon: Scheryl Darter, MD;  Location: [facility];  Service: Gynecology;  Laterality: N/A;  Vaginal Hysterectomy with Right Oophorectomy  . Laparotomy 07/28/2011    Procedure: EXPLORATORY LAPAROTOMY;  Surgeon: Tilda Burrow, MD;  Location: [facility];  Service: Gynecology;  Laterality: N/A;    Family History  Problem Relation Age of Onset  . Diabetes Mother   . Hypertension Mother   . Diabetes Daughter   . Hypertension Daughter     History  Substance Use Topics  . Smoking status: Current Some Day Smoker  . Smokeless tobacco: Not on file  .  Alcohol Use: Yes    OB History    Grav Para Term Preterm Abortions TAB SAB Ect Mult Living   4 3 3  0 1     3      Review of Systems  Constitutional: Negative for fever and chills.  HENT: Negative for neck pain and neck stiffness.   Eyes: Negative for pain.  Respiratory: Negative for shortness of breath.   Cardiovascular: Positive for chest pain.  Gastrointestinal: Positive for abdominal pain. Negative for blood in stool.  Genitourinary: Negative for dysuria.  Musculoskeletal: Negative for back pain.  Skin: Negative for rash and wound.  Neurological: Negative for headaches.  All other systems reviewed and are negative.    Allergies  Fluoxetine hcl; Metoclopramide hcl; and Morphine and related  Home Medications   Current Outpatient Rx  Name Route Sig Dispense Refill  . BUPROPION HCL ER (SR) 150 MG PO TB12 Oral Take 150 mg by mouth daily as needed. For depression    . CARISOPRODOL 350 MG PO TABS Oral Take 350 mg by mouth at bedtime.      Marland Kitchen CLONAZEPAM 0.5 MG PO TABS Oral Take 0.5 mg by mouth 2 (two) times daily as needed. anxiety    . DULOXETINE HCL 60 MG PO CPEP Oral Take 60 mg by mouth 2 (two) times daily.      Marland Kitchen FOLIC ACID 1 MG PO TABS Oral Take 1 mg by mouth  daily.    . FUROSEMIDE 20 MG PO TABS Oral Take 20 mg by mouth daily.    Marland Kitchen GABAPENTIN 800 MG PO TABS Oral Take 800 mg by mouth 3 (three) times daily.      Marland Kitchen HYDROCODONE-ACETAMINOPHEN 5-325 MG PO TABS Oral Take 1 tablet by mouth every 6 (six) hours as needed. Pain    . ADULT MULTIVITAMIN W/MINERALS CH Oral Take 1 tablet by mouth daily.    . QUETIAPINE FUMARATE 200 MG PO TABS Oral Take 200 mg by mouth 2 (two) times daily.    . THIAMINE HCL 100 MG PO TABS Oral Take 100 mg by mouth daily.      BP 102/57  Pulse 93  Temp 97.9 F (36.6 C) (Oral)  Resp 22  SpO2 91%  Physical Exam  Constitutional: She is oriented to person, place, and time. She appears well-developed and well-nourished.  HENT:  Head: Normocephalic and  atraumatic.  Eyes: Conjunctivae and EOM are normal. Pupils are equal, round, and reactive to light.  Neck: Trachea normal. Neck supple. No thyromegaly present.  Cardiovascular: Normal rate, regular rhythm, S1 normal, S2 normal and normal pulses.     No systolic murmur is present   No diastolic murmur is present  Pulses:      Radial pulses are 2+ on the right side, and 2+ on the left side.  Pulmonary/Chest: Effort normal and breath sounds normal. She has no wheezes. She has no rhonchi. She has no rales.       Tender left low lateral ribs without rash or crepitus. Skin intact throughout  Abdominal: Soft. Normal appearance and bowel sounds are normal. There is no CVA tenderness and negative Murphy's sign.       Tender left upper quadrant abdomen with voluntary guarding present. No ecchymosis. No abdominal tenderness otherwise.  Musculoskeletal:       BLE:s Calves nontender, no cords or erythema, negative Homans sign  Neurological: She is alert and oriented to person, place, and time. She has normal strength. No cranial nerve deficit or sensory deficit. GCS eye subscore is 4. GCS verbal subscore is 5. GCS motor subscore is 6.  Skin: Skin is warm and dry. No rash noted. She is not diaphoretic.  Psychiatric: Her speech is normal.       Cooperative and appropriate    ED Course  Procedures (including critical care time)  Results for orders placed during the hospital encounter of 07/12/12  POCT I-STAT, CHEM 8      Component Value Range   Sodium 136  135 - 145 mEq/L   Potassium 3.2 (*) 3.5 - 5.1 mEq/L   Chloride 98  96 - 112 mEq/L   BUN 4 (*) 6 - 23 mg/dL   Creatinine, Ser 1.61  0.50 - 1.10 mg/dL   Glucose, Bld 096 (*) 70 - 99 mg/dL   Calcium, Ion 0.45  4.09 - 1.23 mmol/L   TCO2 22  0 - 100 mmol/L   Hemoglobin 12.6  12.0 - 15.0 g/dL   HCT 81.1  91.4 - 78.2 %   Dg Ribs Unilateral W/chest Left  07/12/2012  *RADIOLOGY REPORT*  Clinical Data: Fall.  Left lower anterior rib pain.  LEFT RIBS  AND CHEST - 3+ VIEW  Comparison: Two-view chest 03/08/2012.  Findings: The heart size is normal.  The lungs are clear.  Dedicated views of the left ribs demonstrate no acute or healing fracture.  There is no pneumothorax.  IMPRESSION:  1.  No evidence for  left-sided rib fracture. 2.  No acute cardiopulmonary disease or significant interval change.  Original Report Authenticated By: Jamesetta Orleans. MATTERN, M.D.   Ct Abdomen Pelvis W Contrast  07/13/2012  *RADIOLOGY REPORT*  Clinical Data: Trauma.  Left-sided pain after fall.  CT ABDOMEN AND PELVIS WITH CONTRAST  Technique:  Multidetector CT imaging of the abdomen and pelvis was performed following the standard protocol during bolus administration of intravenous contrast.  Contrast: OMNIPAQUE IOHEXOL 300 MG/ML  SOLN  Comparison: 07/12/2006  Findings: The lung bases are clear.  Surgical absence of the gallbladder.  There is diffuse low attenuation change throughout the liver consistent with fatty infiltration.  Nodular liver parenchymal contour with enlarged lateral and caudate segments consistent with cirrhosis.  The spleen is enlarged.  Mild epigastric varices.  Mild prominence of porta hepatis, celiac axis, and retroperitoneal lymph nodes are likely inflammatory in stable since prior study.  The pancreas, adrenal glands, and kidneys are unremarkable.  There is mild infiltration in the pararenal fat, greater on the left, and stable since previous study likely representing scarring.  The stomach, small bowel, and colon are not abnormally distended.  Calcification of the abdominal aorta without aneurysm.  No free fluid or free air in the abdomen.  Pelvis:  Scarring in the anterior abdominal wall along the midline consistent with postoperative change.  No free or loculated pelvic fluid collections.  Uterus is surgically absent.  No abnormal adnexal masses.  Appendix is normal.  Diverticula in the sigmoid colon without inflammatory change. Small incisional  hernia to the right rectus abdominous muscle containing bowel but without proximal obstruction.  No displaced pelvic or hip fractures identified.  Normal alignment of the lumbar vertebrae.  IMPRESSION: Changes of hepatic cirrhosis and fatty infiltration of the liver. Portal venous hypertension with splenic enlargement and epigastric varices.  Mild prominence of the upper abdominal lymph nodes, likely reactive.  No acute process demonstrated in the abdomen or pelvis.  A small incisional hernia into the right rectus abdominous muscle containing bowel but without proximal obstruction.  Original Report Authenticated By: Marlon Pel, M.D.     IV fluids. IV Dilaudid pain control. CT scan obtained/ reviewed no splenic lac, changes of cirrhosis as above - PT notified and aware of results, aware of need to avoid alcohol and tylenol, Gi referral provided. Potassium provided for hypokalemia.   MDM   Fall with rib and left-sided pain. No traumatic injuries by imaging as above. Patient notified of CT findings.   Nursing notes reviewed. Vital signs reviewed. Labs and imaging obtained and reviewed as above. IV Dilaudid provided. Pain improved and stable for discharge home. Plan GI followup.     Sunnie Nielsen, MD 07/13/12 [id number]

## 2012-07-13 NOTE — ED Notes (Signed)
Pt continues at CT.

## 2012-08-02 ENCOUNTER — Telehealth: Payer: Self-pay | Admitting: Internal Medicine

## 2012-08-02 NOTE — Telephone Encounter (Signed)
Patient transferred from the practice in 07.  She has been seeing [facility] since March.  She realized she has called the wrong practice.  I have provided her the number to Dr.Outlaw at [facility].  She will contact their office for an appt.

## 2012-08-19 ENCOUNTER — Emergency Department (HOSPITAL_COMMUNITY)
Admission: EM | Admit: 2012-08-19 | Discharge: 2012-08-19 | Discharge status: Against Medical Advice | Payer: Medicare Other | Attending: Emergency Medicine | Admitting: Emergency Medicine

## 2012-08-19 DIAGNOSIS — Z0389 Encounter for observation for other suspected diseases and conditions ruled out: Secondary | ICD-10-CM | POA: Insufficient documentation

## 2012-08-19 NOTE — ED Notes (Signed)
Pt called x 2 at different times without answer.

## 2012-08-23 ENCOUNTER — Encounter (HOSPITAL_COMMUNITY): Payer: Self-pay | Admitting: *Deleted

## 2012-08-23 ENCOUNTER — Emergency Department (HOSPITAL_COMMUNITY)
Admission: EM | Admit: 2012-08-23 | Discharge: 2012-08-23 | Disposition: A | Discharge status: Home / Self Care | Payer: Medicare Other | Attending: Emergency Medicine | Admitting: Emergency Medicine

## 2012-08-23 ENCOUNTER — Emergency Department (HOSPITAL_COMMUNITY): Payer: Medicare Other

## 2012-08-23 DIAGNOSIS — R059 Cough, unspecified: Secondary | ICD-10-CM | POA: Insufficient documentation

## 2012-08-23 DIAGNOSIS — J019 Acute sinusitis, unspecified: Secondary | ICD-10-CM

## 2012-08-23 DIAGNOSIS — F411 Generalized anxiety disorder: Secondary | ICD-10-CM | POA: Insufficient documentation

## 2012-08-23 DIAGNOSIS — F17200 Nicotine dependence, unspecified, uncomplicated: Secondary | ICD-10-CM | POA: Insufficient documentation

## 2012-08-23 DIAGNOSIS — E039 Hypothyroidism, unspecified: Secondary | ICD-10-CM | POA: Insufficient documentation

## 2012-08-23 DIAGNOSIS — J209 Acute bronchitis, unspecified: Secondary | ICD-10-CM

## 2012-08-23 DIAGNOSIS — F319 Bipolar disorder, unspecified: Secondary | ICD-10-CM | POA: Insufficient documentation

## 2012-08-23 DIAGNOSIS — R05 Cough: Secondary | ICD-10-CM | POA: Insufficient documentation

## 2012-08-23 MED ORDER — AZITHROMYCIN 250 MG PO TABS: 250.0000 mg | ORAL_TABLET | Freq: Every day | ORAL | Status: AC

## 2012-08-23 MED ORDER — HYDROCOD POLST-CHLORPHEN POLST 10-8 MG/5ML PO LQCR: 5.0000 mL | Freq: Two times a day (BID) | ORAL | Status: DC | PRN

## 2012-08-23 MED ORDER — ALBUTEROL SULFATE HFA 108 (90 BASE) MCG/ACT IN AERS: 1.0000 | INHALATION_SPRAY | Freq: Four times a day (QID) | RESPIRATORY_TRACT | Status: DC | PRN

## 2012-08-23 NOTE — ED Provider Notes (Signed)
History     CSN: [id number]  Arrival date & time 08/23/12  2019   First MD Initiated Contact with Patient 08/23/12 2207      Chief Complaint  Patient presents with  . Cough    (Consider location/radiation/quality/duration/timing/severity/associated sxs/prior treatment) HPI Comments: Patient reports that she has had a productive cough and sinus pressure for the past week.  She has not taken anything for symptoms.  She denies wheezing or SOB.  She currently smokes one pack/week.    Patient is a 46 y.o. female presenting with cough. The history is provided by the patient.  Cough This is a new problem. Episode onset: one week ago. The problem has been gradually worsening. The cough is productive of sputum. There has been no fever. Associated symptoms include rhinorrhea and sore throat. Pertinent negatives include no chest pain, no chills, no ear pain, no shortness of breath and no wheezing. She has tried nothing for the symptoms. She is a smoker. Her past medical history does not include COPD or asthma.    Past Medical History  Diagnosis Date  . Hypothyroidism   . Blood transfusion July 2012  . Depression   . Anxiety   . Bipolar 1 disorder   . Menorrhagia   . Liver failure     Past Surgical History  Procedure Date  . Tubal ligation   . Laparoscopy   . Orthopedic surgery   . Cholecystectomy   . Fracture surgery   . Hernia repair   . Vaginal hysterectomy 07/27/2011    Procedure: HYSTERECTOMY VAGINAL;  Surgeon: Scheryl Darter, MD;  Location: [facility];  Service: Gynecology;  Laterality: N/A;  Vaginal Hysterectomy with Right Oophorectomy  . Laparotomy 07/28/2011    Procedure: EXPLORATORY LAPAROTOMY;  Surgeon: Tilda Burrow, MD;  Location: [facility];  Service: Gynecology;  Laterality: N/A;    Family History  Problem Relation Age of Onset  . Diabetes Mother   . Hypertension Mother   . Diabetes Daughter   . Hypertension Daughter     History  Substance Use Topics  . Smoking status:  Current Some Day Smoker  . Smokeless tobacco: Not on file  . Alcohol Use: Yes    OB History    Grav Para Term Preterm Abortions TAB SAB Ect Mult Living   4 3 3  0 1     3      Review of Systems  Constitutional: Negative for fever, chills and diaphoresis.  HENT: Positive for congestion, sore throat, rhinorrhea, postnasal drip and sinus pressure. Negative for ear pain.   Respiratory: Positive for cough. Negative for shortness of breath and wheezing.   Cardiovascular: Negative for chest pain.  Gastrointestinal:       Post tussive vomiting  Neurological: Negative for dizziness, syncope and light-headedness.    Allergies  Fluoxetine hcl; Metoclopramide hcl; and Morphine and related  Home Medications   Current Outpatient Rx  Name Route Sig Dispense Refill  . BUPROPION HCL ER (SR) 150 MG PO TB12 Oral Take 150 mg by mouth daily. For depression    . CARISOPRODOL 350 MG PO TABS Oral Take 350 mg by mouth at bedtime.      Marland Kitchen CLONAZEPAM 0.5 MG PO TABS Oral Take 0.5 mg by mouth 2 (two) times daily as needed. anxiety    . DULOXETINE HCL 60 MG PO CPEP Oral Take 60 mg by mouth 2 (two) times daily.      Marland Kitchen FOLIC ACID 1 MG PO TABS Oral Take 1 mg  by mouth daily.    . FUROSEMIDE 20 MG PO TABS Oral Take 20 mg by mouth daily.    Marland Kitchen GABAPENTIN 800 MG PO TABS Oral Take 800 mg by mouth 3 (three) times daily.      . ADULT MULTIVITAMIN W/MINERALS CH Oral Take 1 tablet by mouth daily.    Lenn Sink CF PO Oral Take 30 mLs by mouth every 4 (four) hours as needed. For cough    . QUETIAPINE FUMARATE 200 MG PO TABS Oral Take 200 mg by mouth 2 (two) times daily.    . THIAMINE HCL 100 MG PO TABS Oral Take 100 mg by mouth daily.      BP 124/82  Pulse 90  Temp 97.9 F (36.6 C)  Resp 16  SpO2 94%  Physical Exam  Nursing note and vitals reviewed. Constitutional: She appears well-developed and well-nourished. No distress.  HENT:  Head: Normocephalic and atraumatic.  Right Ear: Tympanic membrane and ear  canal normal.  Left Ear: Tympanic membrane and ear canal normal.  Nose: Mucosal edema and rhinorrhea present. Right sinus exhibits frontal sinus tenderness. Left sinus exhibits frontal sinus tenderness.  Mouth/Throat: Uvula is midline, oropharynx is clear and moist and mucous membranes are normal.  Cardiovascular: Normal rate, regular rhythm and normal heart sounds.   Pulmonary/Chest: Effort normal and breath sounds normal. No accessory muscle usage. Not tachypneic. No respiratory distress. She has no rhonchi. She has no rales.       Slight diffuse wheezing  Neurological: She is alert.  Skin: Skin is warm and dry. She is not diaphoretic.  Psychiatric: She has a normal mood and affect.    ED Course  Procedures (including critical care time)  Labs Reviewed - No data to display Dg Chest 2 View  08/23/2012  *RADIOLOGY REPORT*  Clinical Data: Cough for 1 week.  CHEST - 2 VIEW  Comparison: PA and lateral chest 03/08/2012.  Findings: Lungs are clear.  Peribronchial thickening noted.  No pneumothorax or pleural fluid.  Heart size normal.  IMPRESSION: Bronchitic change.  No focal process.   Original Report Authenticated By: Bernadene Bell. Maricela Curet, M.D.      No diagnosis found.    MDM  Patient presenting with a productive cough x 1 week.  She currently smokes.  Xray showing bronchitis changes, but no focal process.  Patient in no acute respiratory distress.  Vital signs stable.  Patient also having sinus pain and pressure for the past week.  Patient given Azithromycin, Albuterol inhaler, and Tussionex prescriptions.  Return precautions discussed with patient.  Patient in agreement with the plan.  She is instructed to follow up with PCP in a couple of days.        Pascal Lux [place], PA-C 08/23/12 2241

## 2012-08-23 NOTE — ED Notes (Signed)
Pt c/o persistent cough x 4 days, states yellow colored sputum.  Vomiting after coughing, no nausea.

## 2012-08-24 NOTE — ED Provider Notes (Signed)
Medical screening examination/treatment/procedure(s) were performed by non-physician practitioner and as supervising physician I was immediately available for consultation/collaboration.  John-Adam Shatana Saxton, M.D.     John-Adam Carlisle Enke, MD 08/24/12 0055 

## 2012-09-01 ENCOUNTER — Emergency Department (HOSPITAL_COMMUNITY): Payer: Medicare Other

## 2012-09-01 ENCOUNTER — Encounter (HOSPITAL_COMMUNITY): Payer: Self-pay | Admitting: *Deleted

## 2012-09-01 ENCOUNTER — Emergency Department (HOSPITAL_COMMUNITY)
Admission: EM | Admit: 2012-09-01 | Discharge: 2012-09-01 | Disposition: A | Discharge status: Home / Self Care | Payer: Medicare Other | Attending: Emergency Medicine | Admitting: Emergency Medicine

## 2012-09-01 DIAGNOSIS — R05 Cough: Secondary | ICD-10-CM | POA: Insufficient documentation

## 2012-09-01 DIAGNOSIS — R059 Cough, unspecified: Secondary | ICD-10-CM | POA: Insufficient documentation

## 2012-09-01 DIAGNOSIS — E039 Hypothyroidism, unspecified: Secondary | ICD-10-CM | POA: Insufficient documentation

## 2012-09-01 DIAGNOSIS — R5381 Other malaise: Secondary | ICD-10-CM | POA: Insufficient documentation

## 2012-09-01 DIAGNOSIS — R911 Solitary pulmonary nodule: Secondary | ICD-10-CM | POA: Insufficient documentation

## 2012-09-01 DIAGNOSIS — J189 Pneumonia, unspecified organism: Secondary | ICD-10-CM

## 2012-09-01 DIAGNOSIS — R599 Enlarged lymph nodes, unspecified: Secondary | ICD-10-CM | POA: Insufficient documentation

## 2012-09-01 DIAGNOSIS — R0602 Shortness of breath: Secondary | ICD-10-CM | POA: Insufficient documentation

## 2012-09-01 DIAGNOSIS — Z79899 Other long term (current) drug therapy: Secondary | ICD-10-CM | POA: Insufficient documentation

## 2012-09-01 HISTORY — DX: Bronchitis, not specified as acute or chronic: J40

## 2012-09-01 LAB — CBC WITH DIFFERENTIAL/PLATELET
Basophils Relative: 0 % (ref 0–1)
Eosinophils Absolute: 0 10*3/uL (ref 0.0–0.7)
Eosinophils Relative: 2 % (ref 0–5)
HCT: 38.5 % (ref 36.0–46.0)
Hemoglobin: 12.7 g/dL (ref 12.0–15.0)
Lymphs Abs: 0.5 10*3/uL — ABNORMAL LOW (ref 0.7–4.0)
MCH: 31.7 pg (ref 26.0–34.0)
MCHC: 33 g/dL (ref 30.0–36.0)
MCV: 96 fL (ref 78.0–100.0)
Monocytes Absolute: 0.2 10*3/uL (ref 0.1–1.0)
Monocytes Relative: 8 % (ref 3–12)
RBC: 4.01 MIL/uL (ref 3.87–5.11)

## 2012-09-01 LAB — COMPREHENSIVE METABOLIC PANEL
Albumin: 3.3 g/dL — ABNORMAL LOW (ref 3.5–5.2)
BUN: 9 mg/dL (ref 6–23)
Creatinine, Ser: 0.71 mg/dL (ref 0.50–1.10)
GFR calc Af Amer: >90 mL/min (ref >90)
Glucose, Bld: 121 mg/dL — ABNORMAL HIGH (ref 70–99)
Total Protein: 6.7 g/dL (ref 6.0–8.3)

## 2012-09-01 LAB — D-DIMER, QUANTITATIVE: D-Dimer, Quant: 1.03 ug/mL-FEU — ABNORMAL HIGH (ref 0.00–0.48)

## 2012-09-01 MED ORDER — MOXIFLOXACIN HCL 400 MG PO TABS: 400.0000 mg | ORAL_TABLET | Freq: Every day | ORAL | Status: DC

## 2012-09-01 MED ORDER — GUAIFENESIN 100 MG/5ML PO LIQD: 100.0000 mg | ORAL | Status: DC | PRN

## 2012-09-01 MED ORDER — PROMETHAZINE HCL 25 MG PO TABS: 25.0000 mg | ORAL_TABLET | Freq: Four times a day (QID) | ORAL | Status: DC | PRN

## 2012-09-01 MED ORDER — GUAIFENESIN 100 MG/5ML PO LIQD: 100.0000 mg | ORAL | Status: AC | PRN

## 2012-09-01 MED ORDER — HYDROCODONE-ACETAMINOPHEN 5-325 MG PO TABS: 2.0000 | ORAL_TABLET | ORAL | Status: AC | PRN

## 2012-09-01 MED ORDER — MOXIFLOXACIN HCL 400 MG PO TABS: 400.0000 mg | ORAL_TABLET | Freq: Every day | ORAL | Status: AC

## 2012-09-01 MED ORDER — PROMETHAZINE HCL 25 MG/ML IJ SOLN
25.0000 mg | Freq: Once | INTRAMUSCULAR | Status: AC
  Administered 2012-09-01: 25 mg via INTRAVENOUS
  Filled 2012-09-01: qty 1

## 2012-09-01 MED ORDER — HYDROCOD POLST-CHLORPHEN POLST 10-8 MG/5ML PO LQCR
5.0000 mL | Freq: Once | ORAL | Status: AC
  Administered 2012-09-01: 5 mL via ORAL
  Filled 2012-09-01: qty 5

## 2012-09-01 MED ORDER — SODIUM CHLORIDE 0.9 % IV BOLUS (SEPSIS)
500.0000 mL | Freq: Once | INTRAVENOUS | Status: AC
  Administered 2012-09-01: 500 mL via INTRAVENOUS

## 2012-09-01 MED ORDER — HYDROCODONE-ACETAMINOPHEN 5-500 MG PO TABS: 1.0000 | ORAL_TABLET | Freq: Four times a day (QID) | ORAL | Status: DC | PRN

## 2012-09-01 MED ORDER — IOHEXOL 350 MG/ML SOLN
80.0000 mL | Freq: Once | INTRAVENOUS | Status: AC | PRN
  Administered 2012-09-01: 80 mL via INTRAVENOUS

## 2012-09-01 MED ORDER — MOXIFLOXACIN HCL IN NACL 400 MG/250ML IV SOLN
400.0000 mg | Freq: Once | INTRAVENOUS | Status: AC
  Administered 2012-09-01: 400 mg via INTRAVENOUS
  Filled 2012-09-01: qty 250

## 2012-09-01 NOTE — ED Notes (Signed)
Pt was seen here 2 weeks prior for a cough and was dx with bronchitis.  She denies chest pain and states she "does not even feel sick", but she coughs continuously to the point that she can't sleep and loses control of her bladder when she coughs.

## 2012-09-01 NOTE — ED Notes (Signed)
Pt reports a "persistent" productive cough yellow colored sputum for 2 weeks, seen here for the same 2 weeks ago and dx w/bronchitis, pt denies feeling sick, pt reports taking OTC cold and congestion medication, and partially filled her prescribed cough syrup, pt states "it cost too much."

## 2012-09-01 NOTE — ED Notes (Signed)
Pt states  "I really came bc my mother said I should have a EKG bc she was like this once and it was her heart."

## 2012-09-01 NOTE — ED Provider Notes (Signed)
History     CSN: [id number]  Arrival date & time 09/01/12  4098   First MD Initiated Contact with Patient 09/01/12 0750      Chief Complaint  Patient presents with  . Cough    (Consider location/radiation/quality/duration/timing/severity/associated sxs/prior treatment) Patient is a 46 y.o. female presenting with cough. The history is provided by the patient.  Cough This is a recurrent problem. Associated symptoms include shortness of breath. Pertinent negatives include no chest pain and no headaches.   patient had a cough for the last 2 weeks. She states it changed position laying down makes her cough. She states she's had minimal production. She states she doesn't feel sick. No fevers. She was seen in ER a week ago and diagnosed with bronchitis and given azithromycin inhaler. She states her cough has gotten worse. No chest pain. She states she has increasing shortness of breath. She does smoke.  Past Medical History  Diagnosis Date  . Hypothyroidism   . Blood transfusion July 2012  . Depression   . Anxiety   . Bipolar 1 disorder   . Menorrhagia   . Liver failure   . Bronchitis     Past Surgical History  Procedure Date  . Tubal ligation   . Laparoscopy   . Orthopedic surgery   . Cholecystectomy   . Fracture surgery   . Hernia repair   . Vaginal hysterectomy 07/27/2011    Procedure: HYSTERECTOMY VAGINAL;  Surgeon: Scheryl Darter, MD;  Location: [facility];  Service: Gynecology;  Laterality: N/A;  Vaginal Hysterectomy with Right Oophorectomy  . Laparotomy 07/28/2011    Procedure: EXPLORATORY LAPAROTOMY;  Surgeon: Tilda Burrow, MD;  Location: [facility];  Service: Gynecology;  Laterality: N/A;    Family History  Problem Relation Age of Onset  . Diabetes Mother   . Hypertension Mother   . Diabetes Daughter   . Hypertension Daughter     History  Substance Use Topics  . Smoking status: Current Some Day Smoker -- 0.2 packs/day  . Smokeless tobacco: Not on file  . Alcohol Use:  Yes     occasional    OB History    Grav Para Term Preterm Abortions TAB SAB Ect Mult Living   4 3 3  0 1     3      Review of Systems  Constitutional: Positive for fatigue. Negative for activity change and appetite change.  HENT: Negative for neck stiffness.   Eyes: Negative for pain.  Respiratory: Positive for cough and shortness of breath. Negative for chest tightness.   Cardiovascular: Negative for chest pain and leg swelling.  Gastrointestinal: Negative for nausea, vomiting, abdominal pain and diarrhea.  Genitourinary: Negative for flank pain.  Musculoskeletal: Negative for back pain.  Skin: Negative for rash.  Neurological: Negative for weakness, numbness and headaches.  Psychiatric/Behavioral: Negative for behavioral problems.    Allergies  Fluoxetine hcl; Metoclopramide hcl; and Morphine and related  Home Medications   Current Outpatient Rx  Name Route Sig Dispense Refill  . ALBUTEROL SULFATE HFA 108 (90 BASE) MCG/ACT IN AERS Inhalation Inhale 1-2 puffs into the lungs every 6 (six) hours as needed for wheezing. 1 Inhaler 0  . BUPROPION HCL ER (SR) 150 MG PO TB12 Oral Take 150 mg by mouth daily. For depression    . CARISOPRODOL 350 MG PO TABS Oral Take 350 mg by mouth at bedtime.      Marland Kitchen HYDROCOD POLST-CPM POLST ER 10-8 MG/5ML PO LQCR Oral Take 5 mLs by  mouth every 12 (twelve) hours as needed. 140 mL 0  . CLONAZEPAM 0.5 MG PO TABS Oral Take 0.5 mg by mouth 2 (two) times daily as needed. anxiety    . DULOXETINE HCL 60 MG PO CPEP Oral Take 60 mg by mouth 2 (two) times daily.      Marland Kitchen FOLIC ACID 1 MG PO TABS Oral Take 1 mg by mouth daily.    . FUROSEMIDE 20 MG PO TABS Oral Take 20 mg by mouth daily.    Marland Kitchen GABAPENTIN 800 MG PO TABS Oral Take 800 mg by mouth 3 (three) times daily.      . ADULT MULTIVITAMIN W/MINERALS CH Oral Take 1 tablet by mouth daily.    Lenn Sink CF PO Oral Take 30 mLs by mouth every 4 (four) hours as needed. For cough    . QUETIAPINE FUMARATE 200 MG PO  TABS Oral Take 200 mg by mouth 2 (two) times daily.    . THIAMINE HCL 100 MG PO TABS Oral Take 100 mg by mouth daily.    . GUAIFENESIN 100 MG/5ML PO LIQD Oral Take 5-10 mLs (100-200 mg total) by mouth every 4 (four) hours as needed for cough. 60 mL 0  . HYDROCODONE-ACETAMINOPHEN 5-500 MG PO TABS Oral Take 1-2 tablets by mouth every 6 (six) hours as needed for pain. 15 tablet 0  . MOXIFLOXACIN HCL 400 MG PO TABS Oral Take 1 tablet (400 mg total) by mouth daily. 6 tablet 0  . PROMETHAZINE HCL 25 MG PO TABS Oral Take 1 tablet (25 mg total) by mouth every 6 (six) hours as needed for nausea. 20 tablet 0    BP 108/65  Pulse 89  Temp 97.8 F (36.6 C) (Oral)  Resp 22  SpO2 96%  Physical Exam  Nursing note and vitals reviewed. Constitutional: She is oriented to person, place, and time. She appears well-developed and well-nourished.  HENT:  Head: Normocephalic and atraumatic.  Eyes: EOM are normal. Pupils are equal, round, and reactive to light.  Neck: Normal range of motion. Neck supple.  Cardiovascular: Normal rate, regular rhythm and normal heart sounds.   No murmur heard. Pulmonary/Chest: Effort normal and breath sounds normal. No respiratory distress. She has no wheezes. She has no rales.       Patient is coughing. No wheezes or rales  Abdominal: Soft. Bowel sounds are normal. She exhibits no distension. There is no tenderness. There is no rebound and no guarding.  Musculoskeletal: Normal range of motion. She exhibits no edema.  Neurological: She is alert and oriented to person, place, and time. No cranial nerve deficit.  Skin: Skin is warm and dry.  Psychiatric: She has a normal mood and affect. Her speech is normal.    ED Course  Procedures (including critical care time)  Labs Reviewed  CBC WITH DIFFERENTIAL - Abnormal; Notable for the following:    WBC 2.3 (*)     Platelets 109 (*)  PLATELET COUNT CONFIRMED BY SMEAR   Neutro Abs 1.5 (*)     Lymphs Abs 0.5 (*)     All other  components within normal limits  COMPREHENSIVE METABOLIC PANEL - Abnormal; Notable for the following:    Glucose, Bld 121 (*)     Albumin 3.3 (*)     AST 46 (*)     Alkaline Phosphatase 125 (*)     All other components within normal limits  D-DIMER, QUANTITATIVE - Abnormal; Notable for the following:    D-Dimer, Quant 1.03 (*)  All other components within normal limits   Dg Chest 2 View  09/01/2012  *RADIOLOGY REPORT*  Clinical Data: Persistent productive cough, history of bronchitis and smoking  CHEST - 2 VIEW  Comparison: 08/23/2012; 03/08/2012; 07/29/2011  Findings:  Grossly unchanged cardiac silhouette and mediastinal contours. Minimal worsening right peri/infrahilar/mid lung heterogeneous opacities. Linear left infrahilar opacities are grossly unchanged and favored to represent subsegmental atelectasis.  No pleural effusion or pneumothorax.  Unchanged bones.  Post cholecystectomy.  IMPRESSION:  Worsening right mid lung opacities, while possibly atelectasis, worrisome for developing infection.  A follow-up chest radiograph in 4 to 6 weeks after treatment is recommended to ensure resolution.   Original Report Authenticated By: Waynard Reeds, M.D.    Ct Angio Chest Pe W/cm &/or Wo Cm  09/01/2012  *RADIOLOGY REPORT*  Clinical Data: Shortness of breath.  Evaluate for pulmonary embolism.  CT ANGIOGRAPHY CHEST  Technique:  Multidetector CT imaging of the chest using the standard protocol during bolus administration of intravenous contrast. Multiplanar reconstructed images including MIPs were obtained and reviewed to evaluate the vascular anatomy.  Contrast: 80mL OMNIPAQUE IOHEXOL 350 MG/ML SOLN  Comparison: No priors.  Findings:  Mediastinum: Study is limited by suboptimal contrast bolus and a small amount of patient respiratory motion.  While there is no central, lobar or proximal segmental sized emboli identified, distal segmental and subsegmental sized emboli cannot be excluded on the basis of  this examination.  There is extensive mediastinal and bilateral hilar lymphadenopathy.  Specific examples include a low right paratracheal lymph node measuring 18 mm in short axis, subcarinal node or nodal conglomerate measuring approximately 4.0 x 3.2 cm, and prevascular lymph nodes measuring up to 1.4 cm. Heart size is mildly enlarged. Calcifications of the mitral annulus. Esophagus is unremarkable in appearance.  Lungs/Pleura: There is a peripheral pleural based wedge-shaped area of airspace consolidation in the lateral segment of the right middle lobe.  Small pleural-based nodule in the medial aspect of the right lung associated with the medial margin of the minor fissure(image 41 of series eight). 5 mm subpleural nodule in the periphery of the left lower lobe (image 55 of series eight).  No pleural effusions.  Upper Abdomen: The spleen is enlarged measuring at least 15.7 cm in AP dimension.  Decreased attenuation throughout the visualized hepatic parenchyma, suggesting hepatic steatosis.  Musculoskeletal: There are no aggressive appearing lytic or blastic lesions noted in the visualized portions of the skeleton.  IMPRESSION: 1.  Limited examination demonstrating no central, lobar or segmental sized pulmonary embolism.  Distal segmental and subsegmental sized pulmonary embolism cannot be excluded on the basis of today's examination secondary to suboptimal contrast bolus and respiratory motion. 2.  Peripheral airspace consolidation in the lateral segment of the right middle lobe concerning for pneumonia.  Strictly speaking, an area of pulmonary hemorrhage from pulmonary infarction could have a similar appearance.  3.  Importantly, there is extensive mediastinal and bilateral hilar lymphadenopathy.  The appearance is concerning for neoplasm, but differential considerations include systemic processes such as sarcoidosis, lymphoma/leukemia, or aggressive neoplasm such as small cell lung carcinoma.  Clinical  correlation is highly recommended. 4. There are some scattered small subpleural pulmonary nodules, as above, largest of which is 8 mm in the medial aspect of the right lung.  Attention on follow-up studies is recommended. 5.  Splenomegaly. 6  Hepatic steatosis. 7.  Mild cardiomegaly. 8.  Calcifications of the mitral valve and annulus. Echocardiographic correlation for valvular dysfunction may be warranted if clinically  indicated.  These results were called by telephone on 09/01/2012 at 10:20 a.m. to Dr. Rubin Payor, who verbally acknowledged these results.   Original Report Authenticated By: Florencia Reasons, M.D.      1. Community acquired pneumonia       MDM  Vision presents with shortness of breath and cough. She was seen 2 weeks earlier and diagnosed with bronchitis. X-ray done today shows possible pneumonia. Since she is a smoker and is having minimal production d-dimer was done and was elevated. CT chest was done and showed likely pneumonia, however there is not a good bolus so it was not able to rule out distal pulmonary embolisms. After discussion with the radiologist he felt it was unlikely that a VQ scan would help at this time. He states he was able to see the relatively large vessels there is no pulmonary embolism there. He states that the pneumonia couldn't be a pulmonary infarct. There is some mediastinal lymphadenopathy and elevated spleen. Although is not in the past medical history this time, the patient does have a history of sarcoidosis. This is likely the cause of the enlarged lymph nodes. It is reportedly was likely pneumonia causing shortness of breath and cough. She has been on azithromycin will now start on Avelox. She was given instructions on followup. She was told that we have not completely ruled out pulmonary embolisms, however she is willing to followup as needed. She was given pain medicines nausea medicines antibiotics and cough medicines.        Juliet Rude. Rubin Payor,  MD 09/01/12 1227

## 2012-09-04 ENCOUNTER — Emergency Department (HOSPITAL_COMMUNITY)
Admission: EM | Admit: 2012-09-04 | Discharge: 2012-09-04 | Disposition: A | Discharge status: Home / Self Care | Payer: Medicare Other | Attending: Emergency Medicine | Admitting: Emergency Medicine

## 2012-09-04 ENCOUNTER — Encounter (HOSPITAL_COMMUNITY): Payer: Self-pay

## 2012-09-04 ENCOUNTER — Emergency Department (HOSPITAL_COMMUNITY): Payer: Medicare Other

## 2012-09-04 DIAGNOSIS — R05 Cough: Secondary | ICD-10-CM | POA: Insufficient documentation

## 2012-09-04 DIAGNOSIS — F17200 Nicotine dependence, unspecified, uncomplicated: Secondary | ICD-10-CM | POA: Insufficient documentation

## 2012-09-04 DIAGNOSIS — R059 Cough, unspecified: Secondary | ICD-10-CM | POA: Insufficient documentation

## 2012-09-04 DIAGNOSIS — E039 Hypothyroidism, unspecified: Secondary | ICD-10-CM | POA: Insufficient documentation

## 2012-09-04 DIAGNOSIS — F411 Generalized anxiety disorder: Secondary | ICD-10-CM | POA: Insufficient documentation

## 2012-09-04 DIAGNOSIS — F319 Bipolar disorder, unspecified: Secondary | ICD-10-CM | POA: Insufficient documentation

## 2012-09-04 LAB — CBC WITH DIFFERENTIAL/PLATELET
Basophils Absolute: 0 10*3/uL (ref 0.0–0.1)
Eosinophils Relative: 3 % (ref 0–5)
HCT: 41.2 % (ref 36.0–46.0)
Lymphocytes Relative: 18 % (ref 12–46)
Lymphs Abs: 0.6 10*3/uL — ABNORMAL LOW (ref 0.7–4.0)
MCV: 95.8 fL (ref 78.0–100.0)
Monocytes Absolute: 0.3 10*3/uL (ref 0.1–1.0)
Neutro Abs: 2.3 10*3/uL (ref 1.7–7.7)
RBC: 4.3 MIL/uL (ref 3.87–5.11)
RDW: 13 % (ref 11.5–15.5)
WBC: 3.3 10*3/uL — ABNORMAL LOW (ref 4.0–10.5)

## 2012-09-04 LAB — BASIC METABOLIC PANEL
CO2: 26 mEq/L (ref 19–32)
Calcium: 8.7 mg/dL (ref 8.4–10.5)
Chloride: 103 mEq/L (ref 96–112)
Creatinine, Ser: 0.83 mg/dL (ref 0.50–1.10)
Glucose, Bld: 164 mg/dL — ABNORMAL HIGH (ref 70–99)
Sodium: 138 mEq/L (ref 135–145)

## 2012-09-04 MED ORDER — BENZONATATE 100 MG PO CAPS: 100.0000 mg | ORAL_CAPSULE | Freq: Three times a day (TID) | ORAL | Status: AC

## 2012-09-04 MED ORDER — ONDANSETRON HCL 4 MG/2ML IJ SOLN
4.0000 mg | Freq: Once | INTRAMUSCULAR | Status: AC
  Administered 2012-09-04: 4 mg via INTRAVENOUS
  Filled 2012-09-04: qty 2

## 2012-09-04 MED ORDER — BENZONATATE 100 MG PO CAPS
100.0000 mg | ORAL_CAPSULE | Freq: Once | ORAL | Status: AC
  Administered 2012-09-04: 100 mg via ORAL
  Filled 2012-09-04: qty 1

## 2012-09-04 NOTE — ED Provider Notes (Signed)
History     CSN: [id number]  Arrival date & time 09/04/12  9629   First MD Initiated Contact with Patient 09/04/12 0827      Chief Complaint  Patient presents with  . Cough    (Consider location/radiation/quality/duration/timing/severity/associated sxs/prior treatment) HPI Pt was seen 4 days ago and diagnosed with pneumonia sent home with avelox. Pt states she has been compliant with meds and now has increasing SOB, constant cough productive of white sputum and chills. She c/o persistent post-tussive emesis, no fever, diarrrhea or abd pain.  Past Medical History  Diagnosis Date  . Hypothyroidism   . Blood transfusion July 2012  . Depression   . Anxiety   . Bipolar 1 disorder   . Menorrhagia   . Liver failure   . Bronchitis     Past Surgical History  Procedure Date  . Tubal ligation   . Laparoscopy   . Orthopedic surgery   . Cholecystectomy   . Fracture surgery   . Hernia repair   . Vaginal hysterectomy 07/27/2011    Procedure: HYSTERECTOMY VAGINAL;  Surgeon: Scheryl Darter, MD;  Location: [facility];  Service: Gynecology;  Laterality: N/A;  Vaginal Hysterectomy with Right Oophorectomy  . Laparotomy 07/28/2011    Procedure: EXPLORATORY LAPAROTOMY;  Surgeon: Tilda Burrow, MD;  Location: [facility];  Service: Gynecology;  Laterality: N/A;    Family History  Problem Relation Age of Onset  . Diabetes Mother   . Hypertension Mother   . Diabetes Daughter   . Hypertension Daughter     History  Substance Use Topics  . Smoking status: Current Some Day Smoker -- 0.2 packs/day  . Smokeless tobacco: Not on file  . Alcohol Use: Yes     occasional    OB History    Grav Para Term Preterm Abortions TAB SAB Ect Mult Living   4 3 3  0 1     3      Review of Systems  Constitutional: Positive for chills and fatigue. Negative for fever.  HENT: Positive for sore throat.   Respiratory: Positive for cough and shortness of breath. Negative for wheezing.   Cardiovascular: Negative for  chest pain, palpitations and leg swelling.  Gastrointestinal: Positive for vomiting. Negative for nausea and abdominal pain.  Skin: Negative for rash and wound.  Neurological: Negative for numbness and headaches.    Allergies  Fluoxetine hcl; Metoclopramide hcl; and Morphine and related  Home Medications   Current Outpatient Rx  Name Route Sig Dispense Refill  . ALBUTEROL SULFATE HFA 108 (90 BASE) MCG/ACT IN AERS Inhalation Inhale 1-2 puffs into the lungs every 6 (six) hours as needed for wheezing. 1 Inhaler 0  . BUPROPION HCL ER (SR) 150 MG PO TB12 Oral Take 150 mg by mouth daily. For depression    . CARISOPRODOL 350 MG PO TABS Oral Take 350 mg by mouth at bedtime.      Marland Kitchen CLONAZEPAM 0.5 MG PO TABS Oral Take 0.5 mg by mouth 2 (two) times daily as needed. anxiety    . DULOXETINE HCL 60 MG PO CPEP Oral Take 60 mg by mouth 2 (two) times daily.      Marland Kitchen FOLIC ACID 1 MG PO TABS Oral Take 1 mg by mouth daily.    . FUROSEMIDE 20 MG PO TABS Oral Take 20 mg by mouth daily.    Marland Kitchen GABAPENTIN 800 MG PO TABS Oral Take 800 mg by mouth 3 (three) times daily.      . GUAIFENESIN  100 MG/5ML PO LIQD Oral Take 5-10 mLs (100-200 mg total) by mouth every 4 (four) hours as needed for cough. 60 mL 0  . HYDROCODONE-ACETAMINOPHEN 5-325 MG PO TABS Oral Take 2 tablets by mouth every 4 (four) hours as needed for pain. 5 tablet 0  . MOXIFLOXACIN HCL 400 MG PO TABS Oral Take 1 tablet (400 mg total) by mouth daily. 6 tablet 0  . ADULT MULTIVITAMIN W/MINERALS CH Oral Take 1 tablet by mouth daily.    Marland Kitchen PROMETHAZINE HCL 25 MG PO TABS Oral Take 1 tablet (25 mg total) by mouth every 6 (six) hours as needed for nausea. 20 tablet 0  . ROBITUSSIN CF PO Oral Take 30 mLs by mouth every 4 (four) hours as needed. For cough    . QUETIAPINE FUMARATE 200 MG PO TABS Oral Take 200 mg by mouth 2 (two) times daily.    . THIAMINE HCL 100 MG PO TABS Oral Take 100 mg by mouth daily.    Marland Kitchen BENZONATATE 100 MG PO CAPS Oral Take 1 capsule (100 mg  total) by mouth every 8 (eight) hours. 21 capsule 0    BP 105/73  Pulse 90  Temp 97.9 F (36.6 C) (Oral)  Resp 18  SpO2 95%  Physical Exam  Nursing note and vitals reviewed. Constitutional: She is oriented to person, place, and time. She appears well-developed and well-nourished. No distress.  HENT:  Head: Normocephalic and atraumatic.  Mouth/Throat: Oropharynx is clear and moist.       Mild post pharynx erythema  Eyes: EOM are normal. Pupils are equal, round, and reactive to light.  Neck: Normal range of motion. Neck supple.  Cardiovascular: Normal rate and regular rhythm.   Pulmonary/Chest: Effort normal and breath sounds normal. No respiratory distress. She has no wheezes. She has no rales.       coughing  Abdominal: Soft. Bowel sounds are normal. There is no tenderness. There is no rebound and no guarding.  Musculoskeletal: Normal range of motion. She exhibits no edema and no tenderness.       No lower ext swelling or pain  Neurological: She is alert and oriented to person, place, and time.  Skin: Skin is warm and dry. No rash noted. No erythema.  Psychiatric: She has a normal mood and affect. Her behavior is normal.    ED Course  Procedures (including critical care time)  Labs Reviewed  CBC WITH DIFFERENTIAL - Abnormal; Notable for the following:    WBC 3.3 (*)     Platelets 130 (*)     Lymphs Abs 0.6 (*)     All other components within normal limits  BASIC METABOLIC PANEL - Abnormal; Notable for the following:    Glucose, Bld 164 (*)     GFR calc non Af Amer 83 (*)     All other components within normal limits   Dg Chest 2 View  09/04/2012  *RADIOLOGY REPORT*  Clinical Data: Cough.  Sarcoidosis.  Short of breath.  CHEST - 2 VIEW  Comparison: 09/01/2012  Findings: Mild central peribronchial thickening is stable.  No evidence of pulmonary infiltrate or pleural effusion.  Heart size is normal. Mild right paratracheal and bilateral hilar soft tissue prominence remains  stable, consistent with adenopathy is seen on recent CT.  IMPRESSION: Stable right paratracheal and bilateral hilar soft tissue prominence consistent with adenopathy.  No active lung disease.   Original Report Authenticated By: Danae Orleans, M.D.      1. Cough  MDM   Pneumonia appear to be improving. Will d/c home to continue abx and with symptomatic control       Loren Racer, MD 09/04/12 1524

## 2012-09-04 NOTE — ED Notes (Signed)
Pt complains of cough, ongoign for three days, pt seen here for same. In past, no help with phenergan or abx given, pt still vomiting, sts losing ocontrol of bladder and also has a sore throat.

## 2012-09-04 NOTE — ED Notes (Signed)
Pt awaiting furhtur orders, given zofran.

## 2013-01-04 ENCOUNTER — Encounter (HOSPITAL_COMMUNITY): Payer: Self-pay | Admitting: *Deleted

## 2013-01-04 ENCOUNTER — Emergency Department (HOSPITAL_COMMUNITY): Payer: Medicare Other

## 2013-01-04 ENCOUNTER — Inpatient Hospital Stay (HOSPITAL_COMMUNITY)
Admission: EM | Admit: 2013-01-04 | Discharge: 2013-01-12 | DRG: 897 | Disposition: A | Discharge status: Home / Self Care | Payer: Medicare Other | Attending: Internal Medicine | Admitting: Internal Medicine

## 2013-01-04 DIAGNOSIS — D7589 Other specified diseases of blood and blood-forming organs: Secondary | ICD-10-CM | POA: Diagnosis present

## 2013-01-04 DIAGNOSIS — R031 Nonspecific low blood-pressure reading: Secondary | ICD-10-CM | POA: Diagnosis present

## 2013-01-04 DIAGNOSIS — R7309 Other abnormal glucose: Secondary | ICD-10-CM | POA: Diagnosis present

## 2013-01-04 DIAGNOSIS — Z79899 Other long term (current) drug therapy: Secondary | ICD-10-CM

## 2013-01-04 DIAGNOSIS — R05 Cough: Secondary | ICD-10-CM

## 2013-01-04 DIAGNOSIS — F411 Generalized anxiety disorder: Secondary | ICD-10-CM | POA: Diagnosis present

## 2013-01-04 DIAGNOSIS — R109 Unspecified abdominal pain: Secondary | ICD-10-CM

## 2013-01-04 DIAGNOSIS — R490 Dysphonia: Secondary | ICD-10-CM | POA: Diagnosis present

## 2013-01-04 DIAGNOSIS — R410 Disorientation, unspecified: Secondary | ICD-10-CM

## 2013-01-04 DIAGNOSIS — F10931 Alcohol use, unspecified with withdrawal delirium: Principal | ICD-10-CM | POA: Diagnosis present

## 2013-01-04 DIAGNOSIS — D72819 Decreased white blood cell count, unspecified: Secondary | ICD-10-CM | POA: Diagnosis present

## 2013-01-04 DIAGNOSIS — K7030 Alcoholic cirrhosis of liver without ascites: Secondary | ICD-10-CM | POA: Diagnosis present

## 2013-01-04 DIAGNOSIS — F10231 Alcohol dependence with withdrawal delirium: Principal | ICD-10-CM | POA: Diagnosis present

## 2013-01-04 DIAGNOSIS — K709 Alcoholic liver disease, unspecified: Secondary | ICD-10-CM

## 2013-01-04 DIAGNOSIS — T65891A Toxic effect of other specified substances, accidental (unintentional), initial encounter: Secondary | ICD-10-CM | POA: Diagnosis present

## 2013-01-04 DIAGNOSIS — F1020 Alcohol dependence, uncomplicated: Secondary | ICD-10-CM | POA: Diagnosis present

## 2013-01-04 DIAGNOSIS — F10239 Alcohol dependence with withdrawal, unspecified: Secondary | ICD-10-CM

## 2013-01-04 DIAGNOSIS — F17200 Nicotine dependence, unspecified, uncomplicated: Secondary | ICD-10-CM | POA: Diagnosis present

## 2013-01-04 DIAGNOSIS — J069 Acute upper respiratory infection, unspecified: Secondary | ICD-10-CM | POA: Diagnosis present

## 2013-01-04 DIAGNOSIS — D869 Sarcoidosis, unspecified: Secondary | ICD-10-CM | POA: Diagnosis present

## 2013-01-04 DIAGNOSIS — F209 Schizophrenia, unspecified: Secondary | ICD-10-CM | POA: Diagnosis present

## 2013-01-04 DIAGNOSIS — E039 Hypothyroidism, unspecified: Secondary | ICD-10-CM | POA: Diagnosis present

## 2013-01-04 DIAGNOSIS — R0602 Shortness of breath: Secondary | ICD-10-CM

## 2013-01-04 DIAGNOSIS — D61818 Other pancytopenia: Secondary | ICD-10-CM

## 2013-01-04 DIAGNOSIS — R059 Cough, unspecified: Secondary | ICD-10-CM

## 2013-01-04 DIAGNOSIS — Z8659 Personal history of other mental and behavioral disorders: Secondary | ICD-10-CM

## 2013-01-04 DIAGNOSIS — F10232 Alcohol dependence with withdrawal with perceptual disturbance: Secondary | ICD-10-CM

## 2013-01-04 DIAGNOSIS — D6959 Other secondary thrombocytopenia: Secondary | ICD-10-CM | POA: Diagnosis present

## 2013-01-04 DIAGNOSIS — J209 Acute bronchitis, unspecified: Secondary | ICD-10-CM | POA: Diagnosis present

## 2013-01-04 DIAGNOSIS — N390 Urinary tract infection, site not specified: Secondary | ICD-10-CM | POA: Diagnosis not present

## 2013-01-04 DIAGNOSIS — E876 Hypokalemia: Secondary | ICD-10-CM | POA: Diagnosis not present

## 2013-01-04 DIAGNOSIS — F10939 Alcohol use, unspecified with withdrawal, unspecified: Secondary | ICD-10-CM

## 2013-01-04 DIAGNOSIS — F319 Bipolar disorder, unspecified: Secondary | ICD-10-CM

## 2013-01-04 DIAGNOSIS — F1010 Alcohol abuse, uncomplicated: Secondary | ICD-10-CM

## 2013-01-04 DIAGNOSIS — D696 Thrombocytopenia, unspecified: Secondary | ICD-10-CM

## 2013-01-04 DIAGNOSIS — F329 Major depressive disorder, single episode, unspecified: Secondary | ICD-10-CM

## 2013-01-04 DIAGNOSIS — E871 Hypo-osmolality and hyponatremia: Secondary | ICD-10-CM | POA: Diagnosis not present

## 2013-01-04 DIAGNOSIS — T510X4A Toxic effect of ethanol, undetermined, initial encounter: Secondary | ICD-10-CM | POA: Diagnosis present

## 2013-01-04 HISTORY — DX: Unspecified cirrhosis of liver: K74.60

## 2013-01-04 LAB — CBC WITH DIFFERENTIAL/PLATELET
Eosinophils Relative: 1 % (ref 0–5)
HCT: 40.1 % (ref 36.0–46.0)
Lymphs Abs: 0.3 10*3/uL — ABNORMAL LOW (ref 0.7–4.0)
MCH: 33.3 pg (ref 26.0–34.0)
MCV: 97.3 fL (ref 78.0–100.0)
Monocytes Absolute: 0.1 10*3/uL (ref 0.1–1.0)
Monocytes Relative: 6 % (ref 3–12)
Neutro Abs: 1 10*3/uL — ABNORMAL LOW (ref 1.7–7.7)
RBC: 4.12 MIL/uL (ref 3.87–5.11)
WBC: 1.4 10*3/uL — CL (ref 4.0–10.5)

## 2013-01-04 LAB — COMPREHENSIVE METABOLIC PANEL
BUN: 5 mg/dL — ABNORMAL LOW (ref 6–23)
CO2: 26 mEq/L (ref 19–32)
Calcium: 8.3 mg/dL — ABNORMAL LOW (ref 8.4–10.5)
Chloride: 95 mEq/L — ABNORMAL LOW (ref 96–112)
Creatinine, Ser: 0.67 mg/dL (ref 0.50–1.10)
GFR calc Af Amer: >90 mL/min (ref >90)
GFR calc non Af Amer: >90 mL/min (ref >90)
Glucose, Bld: 123 mg/dL — ABNORMAL HIGH (ref 70–99)
Total Bilirubin: 2.6 mg/dL — ABNORMAL HIGH (ref 0.3–1.2)

## 2013-01-04 LAB — URINALYSIS, ROUTINE W REFLEX MICROSCOPIC
Hgb urine dipstick: NEGATIVE
Leukocytes, UA: NEGATIVE
Nitrite: NEGATIVE
Protein, ur: NEGATIVE mg/dL
Urobilinogen, UA: 1 mg/dL (ref 0.0–1.0)

## 2013-01-04 LAB — LACTIC ACID, PLASMA: Lactic Acid, Venous: 1.8 mmol/L (ref 0.5–2.2)

## 2013-01-04 LAB — RAPID URINE DRUG SCREEN, HOSP PERFORMED
Opiates: NOT DETECTED
Tetrahydrocannabinol: NOT DETECTED

## 2013-01-04 LAB — AMMONIA: Ammonia: 28 umol/L (ref 11–60)

## 2013-01-04 MED ORDER — SODIUM CHLORIDE 0.9 % IJ SOLN
3.0000 mL | Freq: Two times a day (BID) | INTRAMUSCULAR | Status: DC
  Administered 2013-01-04: via INTRAVENOUS
  Administered 2013-01-05 – 2013-01-06 (×3): 3 mL via INTRAVENOUS
  Administered 2013-01-07: 10:00:00 via INTRAVENOUS
  Administered 2013-01-08 – 2013-01-11 (×6): 3 mL via INTRAVENOUS

## 2013-01-04 MED ORDER — LORAZEPAM 1 MG PO TABS: 1.0000 mg | ORAL_TABLET | Freq: Four times a day (QID) | ORAL | Status: DC | PRN

## 2013-01-04 MED ORDER — THIAMINE HCL 100 MG/ML IJ SOLN
100.0000 mg | Freq: Every day | INTRAMUSCULAR | Status: DC
  Administered 2013-01-04 – 2013-01-05 (×2): 100 mg via INTRAVENOUS
  Filled 2013-01-04 (×3): qty 1
  Filled 2013-01-04: qty 2

## 2013-01-04 MED ORDER — PNEUMOCOCCAL VAC POLYVALENT 25 MCG/0.5ML IJ INJ
0.5000 mL | INJECTION | INTRAMUSCULAR | Status: AC
  Administered 2013-01-05: 0.5 mL via INTRAMUSCULAR
  Filled 2013-01-04 (×2): qty 0.5

## 2013-01-04 MED ORDER — DULOXETINE HCL 60 MG PO CPEP
60.0000 mg | ORAL_CAPSULE | Freq: Two times a day (BID) | ORAL | Status: DC
  Administered 2013-01-04 – 2013-01-12 (×15): 60 mg via ORAL
  Filled 2013-01-04 (×17): qty 1

## 2013-01-04 MED ORDER — LORAZEPAM 2 MG/ML IJ SOLN
2.0000 mg | Freq: Once | INTRAMUSCULAR | Status: AC
  Administered 2013-01-04: 2 mg via INTRAVENOUS
  Filled 2013-01-04: qty 1

## 2013-01-04 MED ORDER — IPRATROPIUM BROMIDE 0.02 % IN SOLN
0.5000 mg | Freq: Once | RESPIRATORY_TRACT | Status: AC
  Administered 2013-01-04: 0.5 mg via RESPIRATORY_TRACT
  Filled 2013-01-04: qty 2.5

## 2013-01-04 MED ORDER — GABAPENTIN 400 MG PO CAPS
800.0000 mg | ORAL_CAPSULE | Freq: Three times a day (TID) | ORAL | Status: DC
  Administered 2013-01-04 – 2013-01-12 (×20): 800 mg via ORAL
  Filled 2013-01-04 (×26): qty 2

## 2013-01-04 MED ORDER — SODIUM CHLORIDE 0.9 % IV BOLUS (SEPSIS)
1000.0000 mL | Freq: Once | INTRAVENOUS | Status: AC
  Administered 2013-01-04: 1000 mL via INTRAVENOUS

## 2013-01-04 MED ORDER — LEVOFLOXACIN IN D5W 500 MG/100ML IV SOLN
500.0000 mg | Freq: Every day | INTRAVENOUS | Status: DC
  Administered 2013-01-05 – 2013-01-06 (×3): 500 mg via INTRAVENOUS
  Filled 2013-01-04 (×5): qty 100

## 2013-01-04 MED ORDER — VITAMIN B-1 100 MG PO TABS
100.0000 mg | ORAL_TABLET | Freq: Every day | ORAL | Status: DC
  Administered 2013-01-06 – 2013-01-07 (×2): 100 mg via ORAL
  Filled 2013-01-04 (×4): qty 1

## 2013-01-04 MED ORDER — LORAZEPAM 2 MG/ML IJ SOLN
1.0000 mg | Freq: Four times a day (QID) | INTRAMUSCULAR | Status: DC | PRN
  Administered 2013-01-04: 1 mg via INTRAVENOUS
  Filled 2013-01-04: qty 1

## 2013-01-04 MED ORDER — ADULT MULTIVITAMIN W/MINERALS CH: 1.0000 | ORAL_TABLET | Freq: Every day | ORAL | Status: DC

## 2013-01-04 MED ORDER — ALBUTEROL SULFATE (5 MG/ML) 0.5% IN NEBU
2.5000 mg | INHALATION_SOLUTION | Freq: Four times a day (QID) | RESPIRATORY_TRACT | Status: DC
  Administered 2013-01-05 – 2013-01-06 (×5): 2.5 mg via RESPIRATORY_TRACT
  Filled 2013-01-04 (×5): qty 0.5

## 2013-01-04 MED ORDER — IOHEXOL 300 MG/ML  SOLN
100.0000 mL | Freq: Once | INTRAMUSCULAR | Status: AC | PRN
  Administered 2013-01-04: 100 mL via INTRAVENOUS

## 2013-01-04 MED ORDER — ALBUTEROL SULFATE (5 MG/ML) 0.5% IN NEBU
5.0000 mg | INHALATION_SOLUTION | Freq: Once | RESPIRATORY_TRACT | Status: AC
  Administered 2013-01-04: 5 mg via RESPIRATORY_TRACT
  Filled 2013-01-04: qty 1

## 2013-01-04 MED ORDER — KCL IN DEXTROSE-NACL 20-5-0.9 MEQ/L-%-% IV SOLN
INTRAVENOUS | Status: DC
  Administered 2013-01-05 – 2013-01-06 (×4): via INTRAVENOUS
  Filled 2013-01-04 (×5): qty 1000

## 2013-01-04 MED ORDER — BUPROPION HCL ER (SR) 150 MG PO TB12
150.0000 mg | ORAL_TABLET | Freq: Every day | ORAL | Status: DC
  Administered 2013-01-05 – 2013-01-12 (×8): 150 mg via ORAL
  Filled 2013-01-04 (×8): qty 1

## 2013-01-04 MED ORDER — ONDANSETRON HCL 4 MG PO TABS: 4.0000 mg | ORAL_TABLET | Freq: Four times a day (QID) | ORAL | Status: DC | PRN

## 2013-01-04 MED ORDER — DOCUSATE SODIUM 100 MG PO CAPS
100.0000 mg | ORAL_CAPSULE | Freq: Two times a day (BID) | ORAL | Status: DC
Start: 2013-01-04 — End: 2013-01-12
  Administered 2013-01-04 – 2013-01-12 (×14): 100 mg via ORAL
  Filled 2013-01-04 (×17): qty 1

## 2013-01-04 MED ORDER — ADULT MULTIVITAMIN W/MINERALS CH
1.0000 | ORAL_TABLET | Freq: Every day | ORAL | Status: DC
  Administered 2013-01-04 – 2013-01-12 (×9): 1 via ORAL
  Filled 2013-01-04 (×9): qty 1

## 2013-01-04 MED ORDER — FOLIC ACID 1 MG PO TABS: 1.0000 mg | ORAL_TABLET | Freq: Every day | ORAL | Status: DC

## 2013-01-04 MED ORDER — FOLIC ACID 1 MG PO TABS
1.0000 mg | ORAL_TABLET | Freq: Every day | ORAL | Status: DC
  Administered 2013-01-04 – 2013-01-12 (×9): 1 mg via ORAL
  Filled 2013-01-04 (×9): qty 1

## 2013-01-04 MED ORDER — ALBUTEROL SULFATE (5 MG/ML) 0.5% IN NEBU: 2.5000 mg | INHALATION_SOLUTION | RESPIRATORY_TRACT | Status: DC | PRN

## 2013-01-04 MED ORDER — ONDANSETRON HCL 4 MG/2ML IJ SOLN
4.0000 mg | Freq: Four times a day (QID) | INTRAMUSCULAR | Status: DC | PRN
  Administered 2013-01-09: 4 mg via INTRAVENOUS
  Filled 2013-01-04: qty 2

## 2013-01-04 MED ORDER — QUETIAPINE FUMARATE 200 MG PO TABS
200.0000 mg | ORAL_TABLET | Freq: Two times a day (BID) | ORAL | Status: DC
  Administered 2013-01-04 – 2013-01-05 (×2): 200 mg via ORAL
  Filled 2013-01-04 (×3): qty 1

## 2013-01-04 MED ORDER — ONDANSETRON HCL 4 MG/2ML IJ SOLN
4.0000 mg | Freq: Once | INTRAMUSCULAR | Status: AC
  Administered 2013-01-04: 4 mg via INTRAVENOUS
  Filled 2013-01-04: qty 2

## 2013-01-04 NOTE — ED Provider Notes (Signed)
Patient is still nausea, vomiting, anxious, tremulous, generalized shaky, feels like alcohol withdrawal, tachycardic, despite IV fluids and Ativan in the ED. No SI/HI/hallucinations. Medicine will be called for admission. 1950  Hurman Horn, MD 01/05/13 [phone]

## 2013-01-04 NOTE — ED Notes (Signed)
[facility], states that she has been SOB for over a day. 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

## 2013-01-04 NOTE — ED Notes (Signed)
Bed:WA09<BR> Expected date:<BR> Expected time:<BR> Means of arrival:<BR> Comments:<BR>

## 2013-01-04 NOTE — H&P (Signed)
[facility] History and Physical  Erin [name] GNF:[id number] DOB: [birth year]    PCP:  None.  Chief Complaint: increase tremor, cough and feeling malaise.  HPI: Erin [name] is an 47 y.o. female with hx of episodic alcohol abuse, alcoholic cirrhosis, hypothyroidism, Bipolar disorder, schizophrenia, and sarcoidosis, presents to the ER as she was having increased tremors, nervousness, palpitation and symptoms of withdrawal.  She also admitted to having increase sputum productive coughs, having subjective fever and chills.  Evaluation in the ER showed tachycardia, tremors, and clinical presentation suspicious for alcohol withdrawal.  She has a WBC of 1.4K, and platelet of 39K with normal hemoglobin.  Her liver fx tests are elevated and her alcohol level is negative.  Her CXR showed stable lymphadenopathy with no infiltrate.  Hospitalist was asked to admit her for active alcohol withdrawal and upper respiratory infection.    Rewiew of Systems:  Constitutional: Negative for significant weight loss or weight gain Eyes: Negative for eye pain, redness and discharge, diplopia, visual changes, or flashes of light. ENMT: Negative for ear pain, hoarseness, nasal congestion, sinus pressure and sore throat. No headaches; tinnitus, drooling, or problem swallowing. Cardiovascular: Negative for chest pain, palpitations, diaphoresis,  and peripheral edema. ; No orthopnea, PND Respiratory: Negative for  hemoptysis, wheezing and stridor. No pleuritic chestpain. Gastrointestinal: Negative for nausea, vomiting, diarrhea, constipation, abdominal pain, melena, blood in stool, hematemesis, jaundice and rectal bleeding.    Genitourinary: Negative for frequency, dysuria, incontinence,flank pain and hematuria; Musculoskeletal: Negative for back pain and neck pain. Negative for swelling and trauma.;  Skin: . Negative for pruritus, rash, abrasions, bruising and skin lesion.; ulcerations Neuro: Negative for  headache, lightheadedness and neck stiffness. Negative for weakness, altered level of consciousness , altered mental status, extremity weakness, burning feet,  seizure and syncope.  Psych: negative for depression, insomnia, tearfulness, panic attacks, hallucinations, paranoia, suicidal or homicidal ideation    Past Medical History  Diagnosis Date  . Hypothyroidism   . Blood transfusion July 2012  . Depression   . Anxiety   . Bipolar 1 disorder   . Menorrhagia   . Liver failure   . Bronchitis   . Cirrhosis of liver     Past Surgical History  Procedure Date  . Tubal ligation   . Laparoscopy   . Orthopedic surgery   . Cholecystectomy   . Fracture surgery   . Hernia repair   . Vaginal hysterectomy 07/27/2011    Procedure: HYSTERECTOMY VAGINAL;  Surgeon: Scheryl Darter, MD;  Location: [facility];  Service: Gynecology;  Laterality: N/A;  Vaginal Hysterectomy with Right Oophorectomy  . Laparotomy 07/28/2011    Procedure: EXPLORATORY LAPAROTOMY;  Surgeon: Tilda Burrow, MD;  Location: [facility];  Service: Gynecology;  Laterality: N/A;    Medications:  HOME MEDS: Prior to Admission medications   Medication Sig Start Date End Date Taking? Authorizing Provider  albuterol (PROVENTIL HFA;VENTOLIN HFA) 108 (90 BASE) MCG/ACT inhaler Inhale 1-2 puffs into the lungs every 6 (six) hours as needed for wheezing. 08/23/12 08/23/13 Yes Heather Zenaida Niece Wingen, PA-C  buPROPion (WELLBUTRIN SR) 150 MG 12 hr tablet Take 150 mg by mouth daily. For depression   Yes Historical Provider, MD  carisoprodol (SOMA) 350 MG tablet Take 350 mg by mouth at bedtime.     Yes Historical Provider, MD  clonazePAM (KLONOPIN) 0.5 MG tablet Take 0.5 mg by mouth 2 (two) times daily as needed. anxiety   Yes Historical Provider, MD  DULoxetine (CYMBALTA) 60 MG capsule Take 60  mg by mouth 2 (two) times daily.     Yes Historical Provider, MD  folic acid (FOLVITE) 1 MG tablet Take 1 mg by mouth daily. 03/11/12 03/11/13 Yes Laveda Norman, MD    furosemide (LASIX) 20 MG tablet Take 20 mg by mouth daily. 03/11/12 03/11/13 Yes Laveda Norman, MD  gabapentin (NEURONTIN) 800 MG tablet Take 800 mg by mouth 3 (three) times daily.     Yes Historical Provider, MD  Multiple Vitamin (MULTIVITAMIN WITH MINERALS) TABS Take 1 tablet by mouth daily. 03/11/12  Yes Laveda Norman, MD  QUEtiapine (SEROQUEL) 200 MG tablet Take 200 mg by mouth 2 (two) times daily.   Yes Historical Provider, MD  thiamine 100 MG tablet Take 100 mg by mouth daily. 03/11/12 03/11/13 Yes Laveda Norman, MD  promethazine (PHENERGAN) 25 MG tablet Take 1 tablet (25 mg total) by mouth every 6 (six) hours as needed for nausea. 09/01/12 09/08/12  Juliet Rude. Pickering, MD  Pseudoephedrine-DM-GG (ROBITUSSIN CF PO) Take 30 mLs by mouth every 4 (four) hours as needed. For cough    Historical Provider, MD     Allergies:  Allergies  Allergen Reactions  . Fluoxetine Hcl Other (See Comments)     hyperactivity  . Metoclopramide Hcl Other (See Comments)    depression  . Morphine And Related Other (See Comments)    High doses cause hallucinations    Social History:   reports that she has been smoking.  She does not have any smokeless tobacco history on file. She reports that she drinks alcohol. She reports that she does not use illicit drugs.  Family History: Family History  Problem Relation Age of Onset  . Diabetes Mother   . Hypertension Mother   . Diabetes Daughter   . Hypertension Daughter      Physical Exam: Filed Vitals:   01/04/13 1826 01/04/13 1915 01/04/13 1947 01/04/13 2000  BP: 153/139 145/71 123/63 122/77  Pulse: 119 116 115 108  Temp: 98.3 F (36.8 C)     TempSrc: Oral     Resp: 29 21  24   SpO2: 95%   94%   Blood pressure 122/77, pulse 108, temperature 98.3 F (36.8 C), temperature source Oral, resp. rate 24, SpO2 94.00%.  GEN:  Pleasant  patient lying in the stretcher in no acute distress; cooperative with exam. PSYCH:  alert and oriented x4; does not appear anxious  or depressed; affect is appropriate. HEENT: Mucous membranes pink and anicteric; PERRLA; EOM intact; no cervical lymphadenopathy nor thyromegaly or carotid bruit; no JVD; There were no stridor. Neck is very supple. Breasts:: Not examined CHEST WALL: No tenderness CHEST: Normal respiration, scattered rhochi, but no rales. HEART: Regular rhythm but tachycardic.  There are no murmur, rub, or gallops.   BACK: No kyphosis or scoliosis; no CVA tenderness ABDOMEN: soft and non-tender; no masses, no organomegaly, normal abdominal bowel sounds; no pannus; no intertriginous candida. There is no rebound and no distention. Rectal Exam: Not done EXTREMITIES: No bone or joint deformity; age-appropriate arthropathy of the hands and knees; no edema; no ulcerations.  There is no calf tenderness. Genitalia: not examined PULSES: 2+ and symmetric SKIN: Normal hydration no rash or ulceration CNS: Cranial nerves 2-12 grossly intact no focal lateralizing neurologic deficit.  Speech is fluent; uvula elevated with phonation, facial symmetry and tongue midline. DTR are normal bilaterally, cerebella exam is intact, barbinski is negative and strengths are equaled bilaterally.  No sensory loss. She has tremors.   Labs on  Admission:  Basic Metabolic Panel:  Lab 01/04/13 9629  NA 136  K 3.5  CL 95*  CO2 26  GLUCOSE 123*  BUN 5*  CREATININE 0.67  CALCIUM 8.3*  MG --  PHOS --   Liver Function Tests:  Lab 01/04/13 1417  AST 176*  ALT 49*  ALKPHOS 175*  BILITOT 2.6*  PROT 6.8  ALBUMIN 3.3*   No results found for this basename: LIPASE:5,AMYLASE:5 in the last 168 hours  Lab 01/04/13 1423  AMMONIA 28   CBC:  Lab 01/04/13 1417  WBC 1.4*  NEUTROABS 1.0*  HGB 13.7  HCT 40.1  MCV 97.3  PLT 39*   Cardiac Enzymes: No results found for this basename: CKTOTAL:5,CKMB:5,CKMBINDEX:5,TROPONINI:5 in the last 168 hours  CBG: No results found for this basename: GLUCAP:5 in the last 168 hours   Radiological  Exams on Admission: Dg Chest 2 View  01/04/2013  *RADIOLOGY REPORT*  Clinical Data: Shortness of breath and cough  CHEST - 2 VIEW  Comparison: September 04, 2012 chest radiograph and September 01, 2012 chest CT  Findings:  Adenopathy in the right paratracheal and hilar regions appears stable.  Currently there is no edema or consolidation.  The heart size and pulmonary vascularity normal.  No bone lesions.  IMPRESSION: Stable adenopathy.  No edema or consolidation.  Nodular pulmonary lesions seen on prior chest CT are not seen on this study.  Given those findings on CT, further surveillance remains warranted.   Original Report Authenticated By: Bretta Bang, M.D.    Ct Abdomen Pelvis W Contrast  01/04/2013  *RADIOLOGY REPORT*  Clinical Data: 47 year old female with cirrhosis.  Nausea. Alcohol abuse  CT ABDOMEN AND PELVIS WITH CONTRAST  Technique:  Multidetector CT imaging of the abdomen and pelvis was performed following the standard protocol during bolus administration of intravenous contrast.  Contrast: OMNIPAQUE IOHEXOL 300 MG/ML  SOLN  Comparison: CT 07/13/2012  Findings: Lung bases are clear.  No pericardial fluid.  There is diffuse fatty infiltration the liver.  The liver is enlarged with a lobular contour.  Portal veins are patent.  Splenic vein is enlarged.  The spleen is enlarged to a calculated volume of 512 ml. No evidence of ascites.  Pancreas is normal.  Post cholecystectomy.  Adrenal glands and kidneys are normal.  There is perinephric stranding surrounding the kidneys which is similar to prior.  No obstructive uropathy.  The stomach, small bowel, appendix, and cecum are normal.  The colon is collapsed.  No acute findings.  Abdominal aorta is normal caliber.  There are shotty retroperitoneal periaortic lymph nodes similar to prior.  The bladder is normal.  Post hysterectomy anatomy.  There is a small ventral hernia in the lower abdomen just above the pubic symphysis which contains a short  segment of nonobstructed small bowel (image 75).  This is similar to prior.  Post hysterectomy anatomy.  The ovaries are normal. Review of  bone windows demonstrates no aggressive osseous lesions.  IMPRESSION:  1.  No acute abdominal or pelvic findings.  No significant change from prior. 2.  Sequelae of cirrhosis with portal hypertension.  No ascites. 3.  Small midline ventral hernia contains a short segment of nonobstructed small bowel just above the pubic symphysis.   Original Report Authenticated By: Genevive Bi, M.D.      Assessment/Plan Present on Admission:  . ALCOHOLIC LIVER DISEASE . Alcohol withdrawal . CIRRHOSIS, ALCOHOLIC, LIVER . HYPOTHYROIDISM . Sarcoidosis . Pancytopenia . TOBACCO DEPENDENCE . Acute URI . Bipolar  1 disorder   PLAN: Will admit her to the ICU as she is having active alcohol withdrawal and so many comorbidity.  I suspect her withdrawal will require more benzodiazepines.  Her thrombocytopenia and leukopenia is most likely from significant alcohol use.  She drinks between 12 beers and "a Fifth" a day.  Her sarcoidosis is stable, and she doesn't need any steroid at this time.  In addition to the alcohol problems, she is having an URI as well.  I will give her nebs, and oxygen along with IV Levaquin.  She is stable at this time, but I suspect she will get into more withdrawal symptoms.  Will watch her carefully in the ICU, admit her to [facility], institute CIWA protocol and follow her abnormal CBC indices and LFTs carefully. Other plans as per orders.  Code Status: FULL Unk Lightning, MD. [facility] Pager [phone] 7pm to 7am.  01/04/2013, 9:27 PM

## 2013-01-04 NOTE — ED Provider Notes (Signed)
History     CSN: [id number]  Arrival date & time 01/04/13  1346   First MD Initiated Contact with Patient 01/04/13 1406      Chief Complaint  Patient presents with  . Shortness of Breath    (Consider location/radiation/quality/duration/timing/severity/associated sxs/prior treatment) The history is provided by the patient.  Erin [name] is a 47 y.o. female hx of cirrhosis, depression, alcohol abuse here with SOB. SOB and cough worsening since yesterday but she has been having chronic cough. She came here a month ago and was given albuterol but ran out of meds. She also was unable to tolerate PO for 2-3 days and felt nauseous. Hx of cirrhosis but abdomen not more swollen. No fever or diarrhea. She also is chronic alcoholic and last drink was today. She also wants to detox.    Past Medical History  Diagnosis Date  . Hypothyroidism   . Blood transfusion July 2012  . Depression   . Anxiety   . Bipolar 1 disorder   . Menorrhagia   . Liver failure   . Bronchitis   . Cirrhosis of liver     Past Surgical History  Procedure Date  . Tubal ligation   . Laparoscopy   . Orthopedic surgery   . Cholecystectomy   . Fracture surgery   . Hernia repair   . Vaginal hysterectomy 07/27/2011    Procedure: HYSTERECTOMY VAGINAL;  Surgeon: Scheryl Darter, MD;  Location: [facility];  Service: Gynecology;  Laterality: N/A;  Vaginal Hysterectomy with Right Oophorectomy  . Laparotomy 07/28/2011    Procedure: EXPLORATORY LAPAROTOMY;  Surgeon: Tilda Burrow, MD;  Location: [facility];  Service: Gynecology;  Laterality: N/A;    Family History  Problem Relation Age of Onset  . Diabetes Mother   . Hypertension Mother   . Diabetes Daughter   . Hypertension Daughter     History  Substance Use Topics  . Smoking status: Current Some Day Smoker -- 0.2 packs/day  . Smokeless tobacco: Not on file  . Alcohol Use: Yes     Comment: occasional    OB History    Grav Para Term Preterm Abortions TAB SAB Ect  Mult Living   4 3 3  0 1     3      Review of Systems  Respiratory: Positive for shortness of breath.   Gastrointestinal: Positive for nausea.  All other systems reviewed and are negative.    Allergies  Fluoxetine hcl; Metoclopramide hcl; and Morphine and related  Home Medications   Current Outpatient Rx  Name  Route  Sig  Dispense  Refill  . ALBUTEROL SULFATE HFA 108 (90 BASE) MCG/ACT IN AERS   Inhalation   Inhale 1-2 puffs into the lungs every 6 (six) hours as needed for wheezing.   1 Inhaler   0   . BUPROPION HCL ER (SR) 150 MG PO TB12   Oral   Take 150 mg by mouth daily. For depression         . CARISOPRODOL 350 MG PO TABS   Oral   Take 350 mg by mouth at bedtime.           Marland Kitchen CLONAZEPAM 0.5 MG PO TABS   Oral   Take 0.5 mg by mouth 2 (two) times daily as needed. anxiety         . DULOXETINE HCL 60 MG PO CPEP   Oral   Take 60 mg by mouth 2 (two) times daily.           Marland Kitchen  FOLIC ACID 1 MG PO TABS   Oral   Take 1 mg by mouth daily.         . FUROSEMIDE 20 MG PO TABS   Oral   Take 20 mg by mouth daily.         Marland Kitchen GABAPENTIN 800 MG PO TABS   Oral   Take 800 mg by mouth 3 (three) times daily.           . ADULT MULTIVITAMIN W/MINERALS CH   Oral   Take 1 tablet by mouth daily.         . QUETIAPINE FUMARATE 200 MG PO TABS   Oral   Take 200 mg by mouth 2 (two) times daily.         . THIAMINE HCL 100 MG PO TABS   Oral   Take 100 mg by mouth daily.         Marland Kitchen PROMETHAZINE HCL 25 MG PO TABS   Oral   Take 1 tablet (25 mg total) by mouth every 6 (six) hours as needed for nausea.   20 tablet   0   . ROBITUSSIN CF PO   Oral   Take 30 mLs by mouth every 4 (four) hours as needed. For cough           BP 121/95  Pulse 130  Temp 98.6 F (37 C) (Oral)  Resp 22  SpO2 99%  Physical Exam  Nursing note and vitals reviewed. Constitutional: She is oriented to person, place, and time.       Uncomfortable, coughing   HENT:  Head:  Normocephalic.  Mouth/Throat: Oropharynx is clear and moist.  Eyes: Conjunctivae normal are normal. Pupils are equal, round, and reactive to light.  Neck: Normal range of motion.  Cardiovascular: Regular rhythm and normal heart sounds.        Tachycardic   Pulmonary/Chest:       Increased effort, diffuse wheezing.   Abdominal:       Firm, distended. + diffuse tenderness. ? Fluid wave.   Musculoskeletal: Normal range of motion.  Neurological: She is alert and oriented to person, place, and time.  Skin: Skin is warm and dry.  Psychiatric: She has a normal mood and affect. Her behavior is normal. Judgment and thought content normal.    ED Course  Procedures (including critical care time)  EMERGENCY DEPARTMENT [place] FAST EXAM  INDICATIONS:Tachycardia  PERFORMED BY: Myself  IMAGES ARCHIVED?: No  FINDINGS: All views negative  LIMITATIONS:  Body habitus  INTERPRETATION:  No abdominal free fluid  COMMENT:  No ascites visualized. Difficult study due to habitus     Labs Reviewed  CBC WITH DIFFERENTIAL - Abnormal; Notable for the following:    WBC 1.4 (*)     Platelets 39 (*)     Neutro Abs 1.0 (*)     Lymphs Abs 0.3 (*)     All other components within normal limits  COMPREHENSIVE METABOLIC PANEL - Abnormal; Notable for the following:    Chloride 95 (*)     Glucose, Bld 123 (*)     BUN 5 (*)     Calcium 8.3 (*)     Albumin 3.3 (*)     AST 176 (*)     ALT 49 (*)     Alkaline Phosphatase 175 (*)     Total Bilirubin 2.6 (*)     All other components within normal limits  ETHANOL  AMMONIA  URINALYSIS, ROUTINE W REFLEX  MICROSCOPIC  URINE RAPID DRUG SCREEN (HOSP PERFORMED)  LACTIC ACID, PLASMA  CULTURE, BLOOD (ROUTINE X 2)  CULTURE, BLOOD (ROUTINE X 2)   Dg Chest 2 View  01/04/2013  *RADIOLOGY REPORT*  Clinical Data: Shortness of breath and cough  CHEST - 2 VIEW  Comparison: September 04, 2012 chest radiograph and September 01, 2012 chest CT  Findings:  Adenopathy in the  right paratracheal and hilar regions appears stable.  Currently there is no edema or consolidation.  The heart size and pulmonary vascularity normal.  No bone lesions.  IMPRESSION: Stable adenopathy.  No edema or consolidation.  Nodular pulmonary lesions seen on prior chest CT are not seen on this study.  Given those findings on CT, further surveillance remains warranted.   Original Report Authenticated By: Bretta Bang, M.D.      No diagnosis found.   Date: 01/04/2013  Rate: 135  Rhythm: sinus tachycardia  QRS Axis: normal  Intervals: normal  ST/T Wave abnormalities: nonspecific ST changes  Conduction Disutrbances:none  Narrative Interpretation:   Old EKG Reviewed: unchanged    MDM  Erin [name] is a 47 y.o. female here with SOB, nausea, dec PO intake. I think the SOB is likely viral in nature. Given that she has some wheezing will give albuterol and reassess. Will also get CXR. Abdomen is mildly tender and distended so will get CT ab/pel to r/o ascites. Tachycardia is likely from patient in distress vs withdrawal vs dehydration. Low risk for PE.   3:39 PM Patient's WBC 1.4, but not neutropenic. CMP showed mildly elevated LFTs likely from alcohol use. Ammonia nl. CXR stable. HR still around 120s after ativan. CT ab/pel pending. I signed out to Dr. Fonnie Jarvis to f/u the CT and to reassess the patient.         Richardean Canal, MD 01/04/13 [phone]

## 2013-01-05 ENCOUNTER — Other Ambulatory Visit: Payer: Self-pay

## 2013-01-05 ENCOUNTER — Inpatient Hospital Stay (HOSPITAL_COMMUNITY): Payer: Medicare Other

## 2013-01-05 DIAGNOSIS — R109 Unspecified abdominal pain: Secondary | ICD-10-CM

## 2013-01-05 DIAGNOSIS — K709 Alcoholic liver disease, unspecified: Secondary | ICD-10-CM

## 2013-01-05 DIAGNOSIS — R490 Dysphonia: Secondary | ICD-10-CM

## 2013-01-05 DIAGNOSIS — E876 Hypokalemia: Secondary | ICD-10-CM | POA: Diagnosis present

## 2013-01-05 LAB — CBC
MCHC: 34.3 g/dL (ref 30.0–36.0)
RDW: 14.3 % (ref 11.5–15.5)

## 2013-01-05 LAB — COMPREHENSIVE METABOLIC PANEL
ALT: 41 U/L — ABNORMAL HIGH (ref 0–35)
Albumin: 3.1 g/dL — ABNORMAL LOW (ref 3.5–5.2)
Alkaline Phosphatase: 148 U/L — ABNORMAL HIGH (ref 39–117)
BUN: 5 mg/dL — ABNORMAL LOW (ref 6–23)
Calcium: 7.9 mg/dL — ABNORMAL LOW (ref 8.4–10.5)
Potassium: 3 mEq/L — ABNORMAL LOW (ref 3.5–5.1)
Sodium: 134 mEq/L — ABNORMAL LOW (ref 135–145)
Total Protein: 6.1 g/dL (ref 6.0–8.3)

## 2013-01-05 LAB — TSH: TSH: 4.378 u[IU]/mL (ref 0.350–4.500)

## 2013-01-05 LAB — LIPASE, BLOOD: Lipase: 42 U/L (ref 11–59)

## 2013-01-05 LAB — MRSA PCR SCREENING: MRSA by PCR: NEGATIVE

## 2013-01-05 MED ORDER — LORAZEPAM 1 MG PO TABS: 2.0000 mg | ORAL_TABLET | ORAL | Status: DC | PRN

## 2013-01-05 MED ORDER — ADULT MULTIVITAMIN W/MINERALS CH: 1.0000 | ORAL_TABLET | Freq: Every day | ORAL | Status: DC

## 2013-01-05 MED ORDER — LORAZEPAM 2 MG/ML IJ SOLN
1.0000 mg | Freq: Four times a day (QID) | INTRAMUSCULAR | Status: DC
  Administered 2013-01-05 – 2013-01-07 (×7): 1 mg via INTRAVENOUS
  Filled 2013-01-05 (×7): qty 1

## 2013-01-05 MED ORDER — LORAZEPAM 1 MG PO TABS: 1.0000 mg | ORAL_TABLET | ORAL | Status: DC | PRN

## 2013-01-05 MED ORDER — MAGNESIUM SULFATE 40 MG/ML IJ SOLN
2.0000 g | Freq: Once | INTRAMUSCULAR | Status: AC
  Administered 2013-01-05: 2 g via INTRAVENOUS
  Filled 2013-01-05: qty 50

## 2013-01-05 MED ORDER — DEXMEDETOMIDINE HCL IN NACL 400 MCG/100ML IV SOLN
0.4000 ug/kg/h | INTRAVENOUS | Status: DC
  Administered 2013-01-05 – 2013-01-06 (×4): 0.4 ug/kg/h via INTRAVENOUS
  Administered 2013-01-07: 0.1 ug/kg/h via INTRAVENOUS
  Filled 2013-01-05 (×5): qty 100

## 2013-01-05 MED ORDER — BIOTENE DRY MOUTH MT LIQD
15.0000 mL | Freq: Two times a day (BID) | OROMUCOSAL | Status: DC
  Administered 2013-01-05 – 2013-01-11 (×10): 15 mL via OROMUCOSAL

## 2013-01-05 MED ORDER — IBUPROFEN 400 MG PO TABS
400.0000 mg | ORAL_TABLET | Freq: Once | ORAL | Status: AC
  Administered 2013-01-05: 400 mg via ORAL
  Filled 2013-01-05: qty 1

## 2013-01-05 MED ORDER — DEXMEDETOMIDINE HCL IN NACL 200 MCG/50ML IV SOLN: 0.2000 ug/kg/h | INTRAVENOUS | Status: DC

## 2013-01-05 MED ORDER — POTASSIUM CHLORIDE 10 MEQ/100ML IV SOLN
10.0000 meq | INTRAVENOUS | Status: AC
  Administered 2013-01-05 (×3): 10 meq via INTRAVENOUS
  Filled 2013-01-05: qty 300

## 2013-01-05 MED ORDER — HYDROCOD POLST-CHLORPHEN POLST 10-8 MG/5ML PO LQCR
5.0000 mL | Freq: Two times a day (BID) | ORAL | Status: DC
  Administered 2013-01-05 – 2013-01-06 (×2): 5 mL via ORAL
  Filled 2013-01-05 (×2): qty 5

## 2013-01-05 MED ORDER — LORAZEPAM 2 MG/ML IJ SOLN
2.0000 mg | INTRAMUSCULAR | Status: DC | PRN
  Administered 2013-01-05 (×5): 2 mg via INTRAVENOUS
  Filled 2013-01-05 (×5): qty 1

## 2013-01-05 MED ORDER — LORAZEPAM 2 MG/ML IJ SOLN
1.0000 mg | INTRAMUSCULAR | Status: DC | PRN
  Administered 2013-01-06: 1 mg via INTRAVENOUS
  Administered 2013-01-06 – 2013-01-07 (×2): 2 mg via INTRAVENOUS
  Filled 2013-01-05 (×4): qty 1

## 2013-01-05 MED ORDER — CHLORHEXIDINE GLUCONATE 0.12 % MT SOLN
15.0000 mL | Freq: Two times a day (BID) | OROMUCOSAL | Status: DC
  Administered 2013-01-05 – 2013-01-11 (×11): 15 mL via OROMUCOSAL
  Filled 2013-01-05 (×18): qty 15

## 2013-01-05 MED ORDER — HALOPERIDOL LACTATE 5 MG/ML IJ SOLN
2.0000 mg | Freq: Once | INTRAMUSCULAR | Status: AC
  Administered 2013-01-05: 2 mg via INTRAVENOUS
  Filled 2013-01-05: qty 1

## 2013-01-05 MED ORDER — FUROSEMIDE 20 MG PO TABS
20.0000 mg | ORAL_TABLET | Freq: Every day | ORAL | Status: DC
  Administered 2013-01-05 – 2013-01-12 (×8): 20 mg via ORAL
  Filled 2013-01-05 (×8): qty 1

## 2013-01-05 MED ORDER — FOLIC ACID 1 MG PO TABS: 1.0000 mg | ORAL_TABLET | Freq: Every day | ORAL | Status: DC

## 2013-01-05 MED ORDER — LORAZEPAM 2 MG/ML IJ SOLN: 1.0000 mg | INTRAMUSCULAR | Status: DC | PRN

## 2013-01-05 NOTE — Care Management (Signed)
UR completed 

## 2013-01-05 NOTE — Progress Notes (Signed)
Late entry. At  the start of the shift, the Pt was noted to be very agitated prn Ativan given, tried calling  to have the Pt's family members  come and sit with the Pt. Was unable to reach anyone.Page Alejandro Mulling NP.  Order given for Haldol. The pt continued to become more agitated, hitting at staff members trying to get oob ,bed alarm on. For safety the Pt was placed in restraints and on a Precedex drip for agitation  will continue to monitor.

## 2013-01-05 NOTE — Progress Notes (Addendum)
Patient ID: Erin [name]  female  OZH:[id number]    DOB: [birth year]    DOA: 01/04/2013  PCP: Default, Provider, MD  Assessment/Plan:  Primary problem . Alcohol withdrawal - Continue CIWA, placed on scheduled Ativan with PRN Ativan. - Continue multivitamin, thiamine, folic acid - Counseled patient on alcohol cessation  . ALCOHOLIC LIVER DISEASE/ . CIRRHOSIS, ALCOHOLIC, LIVER - Patient does have abdominal distention however no ascites on the CT scan. She has had ascites on the previous admissions. Will do abdominal ultrasound, may need diagnostic/therapeutic paracentesis. - Start a diet after abdominal ultrasound - Obtain lipase, she is complaining of abdominal pain. LFTs are trending down from yesterday.  . Pancytopenia: Likely secondary to alcohol use, monitor counts, PLT 36, WBC 1.3, obtain diff  . TOBACCO DEPENDENCE  . Bipolar 1 disorder: Currently stable, continue psych medications   Hypokalemia: Replaced   Hoarseness with URI symptoms - she states that she had vocal cord surgery 5 years ago, continue antibiotics, Tussionex, breathing treatments. If no significant improvement in next 1-2 days, may need ENT to eval.  DVT Prophylaxis: SCD's  Code Status: FC  Disposition: transfer to tele    Subjective: Noticed voice hoarseness, complaining of abdominal pain, coughing with productive phlegm.   Objective: Weight change:   Intake/Output Summary (Last 24 hours) at 01/05/13 0825 Last data filed at 01/05/13 0400  Gross per 24 hour  Intake    475 ml  Output    750 ml  Net   -275 ml   Blood pressure 117/78, pulse 51, temperature 99 F (37.2 C), temperature source Oral, resp. rate 20, height 5\' 7"  (1.702 m), weight 113.7 kg (250 lb 10.6 oz), SpO2 86.00%.  Physical Exam: General: Alert and awake, oriented x3, not in any acute distress. HEENT: anicteric sclera, pupils reactive to light and accommodation, EOMI CVS: S1-S2 clear, no murmur rubs or gallops Chest: clear to  auscultation bilaterally, no wheezing, rales or rhonchi Abdomen: Soft, distended, no significant tenderness on examination  Extremities: no cyanosis, clubbing or edema noted bilaterally Neuro: Cranial nerves II-XII intact, no focal neurological deficits, tremors  Lab Results: Basic Metabolic Panel:  Lab 01/05/13 6295 01/04/13 1417  NA 134* 136  K 3.0* 3.5  CL 96 95*  CO2 26 26  GLUCOSE 138* 123*  BUN 5* 5*  CREATININE 0.72 0.67  CALCIUM 7.9* 8.3*  MG 1.1* --  PHOS -- --   Liver Function Tests:  Lab 01/05/13 0330 01/04/13 1417  AST 124* 176*  ALT 41* 49*  ALKPHOS 148* 175*  BILITOT 2.9* 2.6*  PROT 6.1 6.8  ALBUMIN 3.1* 3.3*    Lab 01/04/13 1423  AMMONIA 28   CBC:  Lab 01/05/13 0330 01/04/13 1417  WBC 1.3* 1.4*  NEUTROABS -- 1.0*  HGB 12.3 13.7  HCT 35.9* 40.1  MCV 98.9 97.3  PLT 36* 39*    Micro Results: Recent Results (from the past 240 hour(s))  MRSA PCR SCREENING     Status: Normal   Collection Time   01/05/13 12:33 AM      Component Value Range Status Comment   MRSA by PCR NEGATIVE  NEGATIVE Final     Studies/Results: Dg Chest 2 View  01/04/2013  *RADIOLOGY REPORT*  Clinical Data: Shortness of breath and cough  CHEST - 2 VIEW  Comparison: September 04, 2012 chest radiograph and September 01, 2012 chest CT  Findings:  Adenopathy in the right paratracheal and hilar regions appears stable.  Currently there is no edema or consolidation.  The heart size and pulmonary vascularity normal.  No bone lesions.  IMPRESSION: Stable adenopathy.  No edema or consolidation.  Nodular pulmonary lesions seen on prior chest CT are not seen on this study.  Given those findings on CT, further surveillance remains warranted.   Original Report Authenticated By: Bretta Bang, M.D.    Ct Abdomen Pelvis W Contrast  01/04/2013  *RADIOLOGY REPORT*  Clinical Data: 47 year old female with cirrhosis.  Nausea. Alcohol abuse  CT ABDOMEN AND PELVIS WITH CONTRAST  Technique:   Multidetector CT imaging of the abdomen and pelvis was performed following the standard protocol during bolus administration of intravenous contrast.  Contrast: OMNIPAQUE IOHEXOL 300 MG/ML  SOLN  Comparison: CT 07/13/2012  Findings: Lung bases are clear.  No pericardial fluid.  There is diffuse fatty infiltration the liver.  The liver is enlarged with a lobular contour.  Portal veins are patent.  Splenic vein is enlarged.  The spleen is enlarged to a calculated volume of 512 ml. No evidence of ascites.  Pancreas is normal.  Post cholecystectomy.  Adrenal glands and kidneys are normal.  There is perinephric stranding surrounding the kidneys which is similar to prior.  No obstructive uropathy.  The stomach, small bowel, appendix, and cecum are normal.  The colon is collapsed.  No acute findings.  Abdominal aorta is normal caliber.  There are shotty retroperitoneal periaortic lymph nodes similar to prior.  The bladder is normal.  Post hysterectomy anatomy.  There is a small ventral hernia in the lower abdomen just above the pubic symphysis which contains a short segment of nonobstructed small bowel (image 75).  This is similar to prior.  Post hysterectomy anatomy.  The ovaries are normal. Review of  bone windows demonstrates no aggressive osseous lesions.  IMPRESSION:  1.  No acute abdominal or pelvic findings.  No significant change from prior. 2.  Sequelae of cirrhosis with portal hypertension.  No ascites. 3.  Small midline ventral hernia contains a short segment of nonobstructed small bowel just above the pubic symphysis.   Original Report Authenticated By: Genevive Bi, M.D.     Medications: Scheduled Meds:   . albuterol  2.5 mg Nebulization Q6H  . buPROPion  150 mg Oral Daily  . chlorpheniramine-HYDROcodone  5 mL Oral Q12H  . docusate sodium  100 mg Oral BID  . DULoxetine  60 mg Oral BID  . folic acid  1 mg Oral Daily  . gabapentin  800 mg Oral TID  . levofloxacin (LEVAQUIN) IV  500 mg  Intravenous QHS  . LORazepam  1 mg Intravenous Q6H  . multivitamin with minerals  1 tablet Oral Daily  . pneumococcal 23 valent vaccine  0.5 mL Intramuscular Tomorrow-1000  . potassium chloride  10 mEq Intravenous Q1 Hr x 3  . QUEtiapine  200 mg Oral BID  . sodium chloride  3 mL Intravenous Q12H  . thiamine  100 mg Oral Daily   Or  . thiamine  100 mg Intravenous Daily      LOS: 1 day   Uniqua Kihn M.D. [facility] 01/05/2013, 8:25 AM Pager: [phone]  If 7PM-7AM, please contact night-coverage [email or web address] Password [email or web address]

## 2013-01-05 NOTE — Progress Notes (Signed)
46 with h/o alcohol abuse, cirrhosis, schizophrenia   Currently agitated concerning for DTs; not responding to prn benzo.   Start Precedex.

## 2013-01-06 DIAGNOSIS — F10232 Alcohol dependence with withdrawal with perceptual disturbance: Secondary | ICD-10-CM | POA: Diagnosis present

## 2013-01-06 DIAGNOSIS — D72819 Decreased white blood cell count, unspecified: Secondary | ICD-10-CM | POA: Diagnosis present

## 2013-01-06 DIAGNOSIS — D696 Thrombocytopenia, unspecified: Secondary | ICD-10-CM | POA: Diagnosis present

## 2013-01-06 LAB — COMPREHENSIVE METABOLIC PANEL
AST: 86 U/L — ABNORMAL HIGH (ref 0–37)
Albumin: 2.9 g/dL — ABNORMAL LOW (ref 3.5–5.2)
Calcium: 8.1 mg/dL — ABNORMAL LOW (ref 8.4–10.5)
Creatinine, Ser: 0.71 mg/dL (ref 0.50–1.10)

## 2013-01-06 LAB — CBC
MCH: 33.6 pg (ref 26.0–34.0)
MCHC: 33.3 g/dL (ref 30.0–36.0)
Platelets: 37 10*3/uL — ABNORMAL LOW (ref 150–400)
RDW: 13.9 % (ref 11.5–15.5)

## 2013-01-06 LAB — MAGNESIUM: Magnesium: 1.9 mg/dL (ref 1.5–2.5)

## 2013-01-06 MED ORDER — MAGNESIUM SULFATE 40 MG/ML IJ SOLN
2.0000 g | Freq: Once | INTRAMUSCULAR | Status: AC
  Administered 2013-01-06: 2 g via INTRAVENOUS
  Filled 2013-01-06: qty 50

## 2013-01-06 MED ORDER — POTASSIUM CHLORIDE 10 MEQ/100ML IV SOLN
10.0000 meq | INTRAVENOUS | Status: AC
  Administered 2013-01-06 (×3): 10 meq via INTRAVENOUS
  Filled 2013-01-06: qty 300

## 2013-01-06 MED ORDER — MENTHOL 3 MG MT LOZG
1.0000 | LOZENGE | OROMUCOSAL | Status: DC | PRN
  Administered 2013-01-06: 3 mg via ORAL
  Filled 2013-01-06: qty 9

## 2013-01-06 NOTE — Progress Notes (Signed)
INITIAL NUTRITION ASSESSMENT  DOCUMENTATION CODES Per approved criteria  -Obesity Unspecified   INTERVENTION: 1. Recommend swallow evaluation.  2. Patient encouraged to order soft, easy to swallow foods.   NUTRITION DIAGNOSIS: Inadequate oral intake related to swallowing difficulty as evidenced by 2% unintentional weight loss over 1 month.   Goal: Patient will meet >/=90% of estimated nutrition needs.  Monitor:  PO intake, swallowing function, weight, labs  Reason for Assessment: Malnutrition screening tool, score of 3.   47 y.o. female  Admitting Dx: Alcohol withdrawal  ASSESSMENT: Patient with history of alcohol abuse, admitted with withdrawal symptoms. She reports difficulty swallowing, with food getting stuck in her throat. She has a history of vocal cord surgery. She reports decreased intake and weight loss of 2% over the last month as a result of this. Diet advanced to Regular today.   Height: Ht Readings from Last 1 Encounters:  01/04/13 5\' 7"  (1.702 m)    Weight: Wt Readings from Last 1 Encounters:  01/04/13 250 lb 10.6 oz (113.7 kg)    Ideal Body Weight: 61.4 kg  % Ideal Body Weight: 185%  Wt Readings from Last 10 Encounters:  01/04/13 250 lb 10.6 oz (113.7 kg)  03/11/12 256 lb 13.4 oz (116.5 kg)  07/19/11 249 lb 9.6 oz (113.218 kg)  05/11/10 247 lb 14.4 oz (112.447 kg)  03/05/10 251 lb 6.4 oz (114.034 kg)  02/05/10 250 lb 1.6 oz (113.445 kg)  01/11/10 255 lb 6.4 oz (115.849 kg)  12/17/09 256 lb 12.8 oz (116.484 kg)  11/06/09 248 lb (112.492 kg)  10/02/09 243 lb 8 oz (110.451 kg)    Usual Body Weight: 115.9 kg  % Usual Body Weight: 98%  BMI:  Body mass index is 39.26 kg/(m^2). Patient is obese.   Estimated Nutritional Needs: Kcal: 2000-2150 kcal Protein: 115-125 g Fluid: 2.1-2.2 L  Skin: Intact  Diet Order: General  EDUCATION NEEDS: -No education needs identified at this time   Intake/Output Summary (Last 24 hours) at 01/06/13  1244 Last data filed at 01/06/13 1200  Gross per 24 hour  Intake 1422.15 ml  Output    800 ml  Net 622.15 ml    Last BM: 1/11   Labs:   Lab 01/06/13 1036 01/06/13 1035 01/05/13 0330 01/04/13 1417  NA 130* -- 134* 136  K 4.6 -- 3.0* 3.5  CL 97 -- 96 95*  CO2 23 -- 26 26  BUN 4* -- 5* 5*  CREATININE 0.71 -- 0.72 0.67  CALCIUM 8.1* -- 7.9* 8.3*  MG -- 1.9 1.1* --  PHOS -- -- -- --  GLUCOSE 136* -- 138* 123*    CBG (last 3)  No results found for this basename: GLUCAP:3 in the last 72 hours  Scheduled Meds:   . antiseptic oral rinse  15 mL Mouth Rinse q12n4p  . buPROPion  150 mg Oral Daily  . chlorhexidine  15 mL Mouth Rinse BID  . docusate sodium  100 mg Oral BID  . DULoxetine  60 mg Oral BID  . folic acid  1 mg Oral Daily  . furosemide  20 mg Oral Daily  . gabapentin  800 mg Oral TID  . levofloxacin (LEVAQUIN) IV  500 mg Intravenous QHS  . LORazepam  1 mg Intravenous Q6H  . multivitamin with minerals  1 tablet Oral Daily  . sodium chloride  3 mL Intravenous Q12H  . thiamine  100 mg Oral Daily   Or  . thiamine  100 mg Intravenous Daily  Continuous Infusions:   . dexmedetomidine 0.4 mcg/kg/hr (01/06/13 1025)  . dextrose 5 % and 0.9 % NaCl with KCl 20 mEq/L 20 mL/hr at 01/06/13 1244    Past Medical History  Diagnosis Date  . Hypothyroidism   . Blood transfusion July 2012  . Depression   . Anxiety   . Bipolar 1 disorder   . Menorrhagia   . Liver failure   . Bronchitis   . Cirrhosis of liver     Past Surgical History  Procedure Date  . Tubal ligation   . Laparoscopy   . Orthopedic surgery   . Cholecystectomy   . Fracture surgery   . Hernia repair   . Vaginal hysterectomy 07/27/2011    Procedure: HYSTERECTOMY VAGINAL;  Surgeon: Scheryl Darter, MD;  Location: [facility];  Service: Gynecology;  Laterality: N/A;  Vaginal Hysterectomy with Right Oophorectomy  . Laparotomy 07/28/2011    Procedure: EXPLORATORY LAPAROTOMY;  Surgeon: Tilda Burrow, MD;   Location: [facility];  Service: Gynecology;  Laterality: N/A;    Linnell Fulling, RD, LDN Pager #: [phone] After-Hours Pager #: [phone]

## 2013-01-06 NOTE — Consult Note (Signed)
PULMONARY  / CRITICAL CARE MEDICINE  Name: Erin [name] MRN: [id number] DOB: 1966/01/01    LOS: 2  REFERRING MD :  Hongalgi  CHIEF COMPLAINT:  EtOH withdrawal delirium  BRIEF PATIENT DESCRIPTION: 47 yo woman, hx sarcoidosis, schizophrenia, bipolar, EtOH abuse, cirrhosis. Admitted 1/10 for EtOH detox and withdrawal, URI symptoms. Progressive delirium and hallucinations, started on precedex 1/11. CCM consulted 1/12.   LINES / TUBES:  CULTURES: Blood x 2 1/10 >>  MRSA screen 1/10 >> negative  ANTIBIOTICS: levaquin 1/10 >>   SIGNIFICANT EVENTS:  1/11 precedex started  LEVEL OF CARE:  SDU PRIMARY SERVICE:  [facility]  CONSULTANTS:  PCCM CODE STATUS: Full DIET:  clears DVT Px:   GI Px:    PAST MEDICAL HISTORY :  Past Medical History  Diagnosis Date  . Hypothyroidism   . Blood transfusion July 2012  . Depression   . Anxiety   . Bipolar 1 disorder   . Menorrhagia   . Liver failure   . Bronchitis   . Cirrhosis of liver    Past Surgical History  Procedure Date  . Tubal ligation   . Laparoscopy   . Orthopedic surgery   . Cholecystectomy   . Fracture surgery   . Hernia repair   . Vaginal hysterectomy 07/27/2011    Procedure: HYSTERECTOMY VAGINAL;  Surgeon: Scheryl Darter, MD;  Location: [facility];  Service: Gynecology;  Laterality: N/A;  Vaginal Hysterectomy with Right Oophorectomy  . Laparotomy 07/28/2011    Procedure: EXPLORATORY LAPAROTOMY;  Surgeon: Tilda Burrow, MD;  Location: [facility];  Service: Gynecology;  Laterality: N/A;   Prior to Admission medications   Medication Sig Start Date End Date Taking? Authorizing Provider  albuterol (PROVENTIL HFA;VENTOLIN HFA) 108 (90 BASE) MCG/ACT inhaler Inhale 1-2 puffs into the lungs every 6 (six) hours as needed for wheezing. 08/23/12 08/23/13 Yes Heather Zenaida Niece Wingen, PA-C  buPROPion (WELLBUTRIN SR) 150 MG 12 hr tablet Take 150 mg by mouth daily. For depression   Yes Historical Provider, MD  carisoprodol (SOMA) 350 MG tablet Take  350 mg by mouth at bedtime.     Yes Historical Provider, MD  clonazePAM (KLONOPIN) 0.5 MG tablet Take 0.5 mg by mouth 2 (two) times daily as needed. anxiety   Yes Historical Provider, MD  DULoxetine (CYMBALTA) 60 MG capsule Take 60 mg by mouth 2 (two) times daily.     Yes Historical Provider, MD  folic acid (FOLVITE) 1 MG tablet Take 1 mg by mouth daily. 03/11/12 03/11/13 Yes Laveda Norman, MD  furosemide (LASIX) 20 MG tablet Take 20 mg by mouth daily. 03/11/12 03/11/13 Yes Laveda Norman, MD  gabapentin (NEURONTIN) 800 MG tablet Take 800 mg by mouth 3 (three) times daily.     Yes Historical Provider, MD  Multiple Vitamin (MULTIVITAMIN WITH MINERALS) TABS Take 1 tablet by mouth daily. 03/11/12  Yes Laveda Norman, MD  QUEtiapine (SEROQUEL) 200 MG tablet Take 200 mg by mouth 2 (two) times daily.   Yes Historical Provider, MD  thiamine 100 MG tablet Take 100 mg by mouth daily. 03/11/12 03/11/13 Yes Laveda Norman, MD  promethazine (PHENERGAN) 25 MG tablet Take 1 tablet (25 mg total) by mouth every 6 (six) hours as needed for nausea. 09/01/12 09/08/12  Juliet Rude. Pickering, MD  Pseudoephedrine-DM-GG (ROBITUSSIN CF PO) Take 30 mLs by mouth every 4 (four) hours as needed. For cough    Historical Provider, MD   Allergies  Allergen Reactions  . Fluoxetine Hcl Other (  See Comments)     hyperactivity  . Metoclopramide Hcl Other (See Comments)    depression  . Morphine And Related Other (See Comments)    High doses cause hallucinations    FAMILY HISTORY:  Family History  Problem Relation Age of Onset  . Diabetes Mother   . Hypertension Mother   . Diabetes Daughter   . Hypertension Daughter    SOCIAL HISTORY:  reports that she has been smoking.  She does not have any smokeless tobacco history on file. She reports that she drinks alcohol. She reports that she does not use illicit drugs.  REVIEW OF SYSTEMS:  Pt comfortable but having hallucinations. Having some cough   INTERVAL HISTORY:   VITAL SIGNS: Temp:   [97.9 F (36.6 C)-99.4 F (37.4 C)] 98.2 F (36.8 C) (01/12 1200) Pulse Rate:  [72-149] 84  (01/12 1400) Resp:  [16-28] 23  (01/12 1400) BP: (89-143)/(57-83) 123/80 mmHg (01/12 1400) SpO2:  [93 %-99 %] 95 % (01/12 1400) HEMODYNAMICS:   VENTILATOR SETTINGS:   INTAKE / OUTPUT: Intake/Output      01/11 0701 - 01/12 0700 01/12 0701 - 01/13 0700   I.V. (mL/kg) 1365.2 (12) 432 (3.8)   IV Piggyback 100    Total Intake(mL/kg) 1465.2 (12.9) 432 (3.8)   Urine (mL/kg/hr) 1125 (0.4) 650 (0.7)   Total Output 1125 650   Net +340.2 -218        Urine Occurrence 1 x    Stool Occurrence 1 x      PHYSICAL EXAMINATION: General:  Obese, NAD Neuro:  Awake, alert, oriented, follows commands, moves all ext HEENT:  Op clear, no stridor Cardiovascular:  Tachy, regular, no M Lungs:  Clear, decreased at bases Abdomen:  Obese, NT, + BS Musculoskeletal:  No deformities Skin:  No rash   LABS: Cbc  Lab 01/06/13 1035 01/05/13 0330 01/04/13 1417  WBC 2.2* -- --  HGB 12.9 12.3 13.7  HCT 38.7 35.9* 40.1  PLT 37* 36* 39*    Chemistry   Lab 01/06/13 1036 01/06/13 1035 01/05/13 0330 01/04/13 1417  NA 130* -- 134* 136  K 4.6 -- 3.0* 3.5  CL 97 -- 96 95*  CO2 23 -- 26 26  BUN 4* -- 5* 5*  CREATININE 0.71 -- 0.72 0.67  CALCIUM 8.1* -- 7.9* 8.3*  MG -- 1.9 1.1* --  PHOS -- -- -- --  GLUCOSE 136* -- 138* 123*    Liver fxn  Lab 01/06/13 1036 01/05/13 0330 01/04/13 1417  AST 86* 124* 176*  ALT 34 41* 49*  ALKPHOS 140* 148* 175*  BILITOT 2.6* 2.9* 2.6*  PROT 6.5 6.1 6.8  ALBUMIN 2.9* 3.1* 3.3*   coags No results found for this basename: APTT:3,INR:3 in the last 168 hours Sepsis markers  Lab 01/04/13 1504  LATICACIDVEN 1.8  PROCALCITON --   Cardiac markers No results found for this basename: CKTOTAL:3,CKMB:3,TROPONINI:3 in the last 168 hours BNP No results found for this basename: PROBNP:3 in the last 168 hours ABG No results found for this basename:  PHART:3,PCO2ART:3,PO2ART:3,HCO3:3,TCO2:3 in the last 168 hours  CBG trend No results found for this basename: GLUCAP:5 in the last 168 hours  IMAGING: Ct Abdomen Pelvis W Contrast  01/04/2013  *RADIOLOGY REPORT*  Clinical Data: 47 year old female with cirrhosis.  Nausea. Alcohol abuse  CT ABDOMEN AND PELVIS WITH CONTRAST  Technique:  Multidetector CT imaging of the abdomen and pelvis was performed following the standard protocol during bolus administration of intravenous contrast.  Contrast:  OMNIPAQUE IOHEXOL 300 MG/ML  SOLN  Comparison: CT 07/13/2012  Findings: Lung bases are clear.  No pericardial fluid.  There is diffuse fatty infiltration the liver.  The liver is enlarged with a lobular contour.  Portal veins are patent.  Splenic vein is enlarged.  The spleen is enlarged to a calculated volume of 512 ml. No evidence of ascites.  Pancreas is normal.  Post cholecystectomy.  Adrenal glands and kidneys are normal.  There is perinephric stranding surrounding the kidneys which is similar to prior.  No obstructive uropathy.  The stomach, small bowel, appendix, and cecum are normal.  The colon is collapsed.  No acute findings.  Abdominal aorta is normal caliber.  There are shotty retroperitoneal periaortic lymph nodes similar to prior.  The bladder is normal.  Post hysterectomy anatomy.  There is a small ventral hernia in the lower abdomen just above the pubic symphysis which contains a short segment of nonobstructed small bowel (image 75).  This is similar to prior.  Post hysterectomy anatomy.  The ovaries are normal. Review of  bone windows demonstrates no aggressive osseous lesions.  IMPRESSION:  1.  No acute abdominal or pelvic findings.  No significant change from prior. 2.  Sequelae of cirrhosis with portal hypertension.  No ascites. 3.  Small midline ventral hernia contains a short segment of nonobstructed small bowel just above the pubic symphysis.   Original Report Authenticated By: Genevive Bi, M.D.    US Abdomen Limited  01/05/2013  *RADIOLOGY REPORT*  Clinical Data: Cirrhosis and abdominal distention.  LIMITED ABDOMEN ULTRASOUND FOR ASCITES  Technique:  Limited ultrasound survey for ascites was performed in all four abdominal quadrants.  Comparison:  CT scan 01/04/2013.  Findings: No abdominal/pelvic ascites is demonstrated.  IMPRESSION: Negative ultrasound for ascites.   Original Report Authenticated By: Rudie Meyer, M.D.     ECG:  DIAGNOSES: Active Problems:  Sarcoidosis  HYPOTHYROIDISM  TOBACCO DEPENDENCE  CIRRHOSIS, ALCOHOLIC, LIVER  HX, PERSONAL, SCHIZOPHRENIA  Bipolar 1 disorder  Acute URI  Hypokalemia  Hoarseness with URI symptoms  Alcohol withdrawal hallucinosis  Leukopenia  Thrombocytopenia   ASSESSMENT / PLAN:  PULMONARY  ASSESSMENT: Sarcoidosis with hx LAD and mild (stable) nodular disease PLAN:   - no interventions  CARDIOVASCULAR  ASSESSMENT:  HTN PLAN:  - home lasix  RENAL  ASSESSMENT:   Hyponatremia  PLAN:   - follow BMP  GASTROINTESTINAL  ASSESSMENT:   Cirrhosis, due to EtOH, LFT improving PLAN:   - follow LFT and INR  HEMATOLOGIC  ASSESSMENT:   Thrombocytopenia Leukopenia, likely due to EtOH  PLAN:   INFECTIOUS  ASSESSMENT:   URI, ? bronchitis PLAN:   - started on levaquin by primary service  ENDOCRINE  ASSESSMENT:  hyperglycemia   PLAN:   - follow glucose, consider CBG's if remain elevated  NEUROLOGIC  ASSESSMENT:   EtOH withdrawal, no difficulty with airway protection or excessive agitation at this time Schizophrenia, bipolar disease PLAN:   - precedex drip titrated per protocol - ativan scheduled and prn - folate, thiamine   I have personally obtained a history, examined the patient, evaluated laboratory and imaging results, formulated the assessment and plan and placed orders. CRITICAL CARE: The patient is critically ill with multiple organ systems failure and requires high complexity  decision making for assessment and support, frequent evaluation and titration of therapies, application of advanced monitoring technologies and extensive interpretation of multiple databases. Critical Care Time devoted to patient care services described in this note is 87  minutes.   Levy Pupa, MD, PhD 01/06/2013, 3:37 PM [facility] [phone] or if no answer [phone]

## 2013-01-06 NOTE — Progress Notes (Signed)
Patient ID: Erin [name]  female  QIO:[id number]    DOB: May 22, 1966    DOA: 01/04/2013  PCP: Default, Provider, MD  Brief narrative Erin [name] is an 47 y.o. female with hx of episodic alcohol abuse, alcoholic cirrhosis, hypothyroidism, Bipolar disorder, schizophrenia, and sarcoidosis, presented to the ED on 1/10 as she was having increased tremors, nervousness, palpitation and symptoms of withdrawal. She also admitted to having increase sputum productive coughs, having subjective fever and chills. Evaluation in the ER showed tachycardia, tremors, and clinical presentation suspicious for alcohol withdrawal. She has a WBC of 1.4K, and platelet of 39K with normal hemoglobin. Her liver fx tests are elevated and her alcohol level is negative. Her CXR showed stable lymphadenopathy with no infiltrate. Hospitalist was asked to admit her for active alcohol withdrawal and upper respiratory infection.   Assessment/Plan:  Primary problem  Alcohol withdrawal DTs Patient was admitted to ICU and started on Ativan protocol. Overnight on 1/11, withdrawal but worsened patient was started on Precedex infusion by critical care medicine. Patient indicates that she is feeling somewhat better but still has periodic hallucinations where she sees people in the room. Management of Precedex drip per CCM  Alcoholic cirrhosis Abdominal ultrasound negative for ascites. Lipase normal. She apparently had some abdominal pain on 1/11 but denies any today. Diet as tolerated.  Leukopenia, thrombocytopenia and macrocytosis Likely secondary to alcohol abuse. White cell count is improved. Platelets stable. No overt bleeding.   Hypokalemia and hypomagnesemia Secondary to alcohol abuse. These have been corrected. Follow daily BMP.   Tobacco abuse Cessation counseled.   Bipolar 1 disorder  Currently stable, continue psych medications  Hoarseness with acute bronchitis she states that she had vocal cord surgery 5 years  ago, ? Viral etiology. Continue Levaquin for now. If no significant improvement in next 1-2 days, may need ENT to eval. No difficulty swallowing.  DVT Prophylaxis: SCD's    Code Status: Full Family communication: Discussed with patient Disposition: Changed to step down.   Subjective: Feels slightly better today. Seeing people in the room who are not there-better than last night. Still hoarse but without difficulty swallowing.  Objective: Weight change:   Intake/Output Summary (Last 24 hours) at 01/06/13 1211 Last data filed at 01/06/13 1200  Gross per 24 hour  Intake 1422.15 ml  Output    800 ml  Net 622.15 ml   Blood pressure 102/76, pulse 77, temperature 97.9 F (36.6 C), temperature source Oral, resp. rate 18, height 5\' 7"  (1.702 m), weight 113.7 kg (250 lb 10.6 oz), SpO2 98.00%.  Physical Exam: General: Alert and awake, oriented x3, not in any acute distress. HEENT: Throat exam unremarkable. Hoarse CVS: S1-S2 clear, no murmur rubs or gallops. Telemetry shows sinus rhythm Chest: clear to auscultation bilaterally, no wheezing, rales or rhonchi. No increased work of breathing. Abdomen: Soft, distended, no significant tenderness on examination. Normal bowel sounds heard.  Extremities: Symmetric 5 x 5 power. Neuro: Cranial nerves II-XII intact, no focal neurological deficits, tremors-mild  Lab Results: Basic Metabolic Panel:  Lab 01/06/13 3244 01/06/13 1035 01/05/13 0330  NA 130* -- 134*  K 4.6 -- 3.0*  CL 97 -- 96  CO2 23 -- 26  GLUCOSE 136* -- 138*  BUN 4* -- 5*  CREATININE 0.71 -- 0.72  CALCIUM 8.1* -- 7.9*  MG -- 1.9 --  PHOS -- -- --   Liver Function Tests:  Lab 01/06/13 1036 01/05/13 0330  AST 86* 124*  ALT 34 41*  ALKPHOS 140*  148*  BILITOT 2.6* 2.9*  PROT 6.5 6.1  ALBUMIN 2.9* 3.1*    Lab 01/04/13 1423  AMMONIA 28   CBC:  Lab 01/06/13 1035 01/05/13 0330 01/04/13 1417  WBC 2.2* 1.3* --  NEUTROABS -- -- 1.0*  HGB 12.9 12.3 --  HCT 38.7 35.9*  --  MCV 100.8* 98.9 --  PLT 37* 36* --    Micro Results: Recent Results (from the past 240 hour(s))  MRSA PCR SCREENING     Status: Normal   Collection Time   01/05/13 12:33 AM      Component Value Range Status Comment   MRSA by PCR NEGATIVE  NEGATIVE Final     Studies/Results: Dg Chest 2 View  01/04/2013  *RADIOLOGY REPORT*  Clinical Data: Shortness of breath and cough  CHEST - 2 VIEW  Comparison: September 04, 2012 chest radiograph and September 01, 2012 chest CT  Findings:  Adenopathy in the right paratracheal and hilar regions appears stable.  Currently there is no edema or consolidation.  The heart size and pulmonary vascularity normal.  No bone lesions.  IMPRESSION: Stable adenopathy.  No edema or consolidation.  Nodular pulmonary lesions seen on prior chest CT are not seen on this study.  Given those findings on CT, further surveillance remains warranted.   Original Report Authenticated By: Bretta Bang, M.D.    Ct Abdomen Pelvis W Contrast  01/04/2013  *RADIOLOGY REPORT*  Clinical Data: 47 year old female with cirrhosis.  Nausea. Alcohol abuse  CT ABDOMEN AND PELVIS WITH CONTRAST  Technique:  Multidetector CT imaging of the abdomen and pelvis was performed following the standard protocol during bolus administration of intravenous contrast.  Contrast: OMNIPAQUE IOHEXOL 300 MG/ML  SOLN  Comparison: CT 07/13/2012  Findings: Lung bases are clear.  No pericardial fluid.  There is diffuse fatty infiltration the liver.  The liver is enlarged with a lobular contour.  Portal veins are patent.  Splenic vein is enlarged.  The spleen is enlarged to a calculated volume of 512 ml. No evidence of ascites.  Pancreas is normal.  Post cholecystectomy.  Adrenal glands and kidneys are normal.  There is perinephric stranding surrounding the kidneys which is similar to prior.  No obstructive uropathy.  The stomach, small bowel, appendix, and cecum are normal.  The colon is collapsed.  No acute findings.   Abdominal aorta is normal caliber.  There are shotty retroperitoneal periaortic lymph nodes similar to prior.  The bladder is normal.  Post hysterectomy anatomy.  There is a small ventral hernia in the lower abdomen just above the pubic symphysis which contains a short segment of nonobstructed small bowel (image 75).  This is similar to prior.  Post hysterectomy anatomy.  The ovaries are normal. Review of  bone windows demonstrates no aggressive osseous lesions.  IMPRESSION:  1.  No acute abdominal or pelvic findings.  No significant change from prior. 2.  Sequelae of cirrhosis with portal hypertension.  No ascites. 3.  Small midline ventral hernia contains a short segment of nonobstructed small bowel just above the pubic symphysis.   Original Report Authenticated By: Genevive Bi, M.D.    Limited abdominal ultrasound for ascites  IMPRESSION:  Negative ultrasound for ascites.  Medications: Scheduled Meds:    . albuterol  2.5 mg Nebulization Q6H  . antiseptic oral rinse  15 mL Mouth Rinse q12n4p  . buPROPion  150 mg Oral Daily  . chlorhexidine  15 mL Mouth Rinse BID  . chlorpheniramine-HYDROcodone  5 mL Oral Q12H  .  docusate sodium  100 mg Oral BID  . DULoxetine  60 mg Oral BID  . folic acid  1 mg Oral Daily  . furosemide  20 mg Oral Daily  . gabapentin  800 mg Oral TID  . levofloxacin (LEVAQUIN) IV  500 mg Intravenous QHS  . LORazepam  1 mg Intravenous Q6H  . multivitamin with minerals  1 tablet Oral Daily  . sodium chloride  3 mL Intravenous Q12H  . thiamine  100 mg Oral Daily   Or  . thiamine  100 mg Intravenous Daily      LOS: 2 days   [facility] M.D. [facility] 01/06/2013, 12:11 PM Pager: [phone]  If 7PM-7AM, please contact night-coverage [email or web address] Password [email or web address]

## 2013-01-06 NOTE — Progress Notes (Signed)
Page Lenny Pastel NP about the Pt's K this am 3.0 no orders given will monitor.

## 2013-01-06 NOTE — Progress Notes (Signed)
Pt resting v/s stable Precedex drip decreased, bed alarm on will continue to monitor.

## 2013-01-07 DIAGNOSIS — D869 Sarcoidosis, unspecified: Secondary | ICD-10-CM

## 2013-01-07 DIAGNOSIS — D696 Thrombocytopenia, unspecified: Secondary | ICD-10-CM

## 2013-01-07 DIAGNOSIS — E876 Hypokalemia: Secondary | ICD-10-CM

## 2013-01-07 DIAGNOSIS — K7030 Alcoholic cirrhosis of liver without ascites: Secondary | ICD-10-CM

## 2013-01-07 DIAGNOSIS — D72819 Decreased white blood cell count, unspecified: Secondary | ICD-10-CM

## 2013-01-07 DIAGNOSIS — J069 Acute upper respiratory infection, unspecified: Secondary | ICD-10-CM

## 2013-01-07 DIAGNOSIS — F1010 Alcohol abuse, uncomplicated: Secondary | ICD-10-CM

## 2013-01-07 DIAGNOSIS — F10951 Alcohol use, unspecified with alcohol-induced psychotic disorder with hallucinations: Secondary | ICD-10-CM

## 2013-01-07 LAB — CBC WITH DIFFERENTIAL/PLATELET
Eosinophils Absolute: 0.1 10*3/uL (ref 0.0–0.7)
Eosinophils Relative: 4 % (ref 0–5)
Hemoglobin: 13.4 g/dL (ref 12.0–15.0)
Lymphocytes Relative: 24 % (ref 12–46)
Lymphs Abs: 0.5 10*3/uL — ABNORMAL LOW (ref 0.7–4.0)
MCH: 33.7 pg (ref 26.0–34.0)
MCV: 99 fL (ref 78.0–100.0)
Monocytes Relative: 9 % (ref 3–12)
Neutrophils Relative %: 63 % (ref 43–77)
RBC: 3.98 MIL/uL (ref 3.87–5.11)

## 2013-01-07 LAB — COMPREHENSIVE METABOLIC PANEL
ALT: 33 U/L (ref 0–35)
AST: 81 U/L — ABNORMAL HIGH (ref 0–37)
Alkaline Phosphatase: 133 U/L — ABNORMAL HIGH (ref 39–117)
CO2: 25 mEq/L (ref 19–32)
Calcium: 8.7 mg/dL (ref 8.4–10.5)
GFR calc Af Amer: >90 mL/min (ref >90)
GFR calc non Af Amer: >90 mL/min (ref >90)
Glucose, Bld: 120 mg/dL — ABNORMAL HIGH (ref 70–99)
Potassium: 4.3 mEq/L (ref 3.5–5.1)
Sodium: 128 mEq/L — ABNORMAL LOW (ref 135–145)

## 2013-01-07 MED ORDER — CARISOPRODOL 350 MG PO TABS
350.0000 mg | ORAL_TABLET | Freq: Every day | ORAL | Status: DC
  Administered 2013-01-07: 350 mg via ORAL
  Filled 2013-01-07: qty 1

## 2013-01-07 MED ORDER — LORAZEPAM 2 MG/ML IJ SOLN
1.0000 mg | Freq: Four times a day (QID) | INTRAMUSCULAR | Status: DC | PRN
  Administered 2013-01-07 – 2013-01-08 (×4): 1 mg via INTRAVENOUS
  Filled 2013-01-07 (×7): qty 1

## 2013-01-07 MED ORDER — ACETAMINOPHEN 325 MG PO TABS: 650.0000 mg | ORAL_TABLET | Freq: Four times a day (QID) | ORAL | Status: DC | PRN

## 2013-01-07 MED ORDER — LORAZEPAM 1 MG PO TABS
1.0000 mg | ORAL_TABLET | Freq: Four times a day (QID) | ORAL | Status: DC | PRN
  Administered 2013-01-07: 1 mg via ORAL
  Filled 2013-01-07: qty 1

## 2013-01-07 MED ORDER — TRAMADOL HCL 50 MG PO TABS
50.0000 mg | ORAL_TABLET | Freq: Four times a day (QID) | ORAL | Status: DC | PRN
  Administered 2013-01-07 (×2): 50 mg via ORAL
  Filled 2013-01-07 (×2): qty 1

## 2013-01-07 MED ORDER — VITAMIN B-1 100 MG PO TABS
100.0000 mg | ORAL_TABLET | Freq: Every day | ORAL | Status: DC
  Administered 2013-01-08 – 2013-01-12 (×5): 100 mg via ORAL
  Filled 2013-01-07 (×5): qty 1

## 2013-01-07 MED ORDER — PNEUMOCOCCAL VAC POLYVALENT 25 MCG/0.5ML IJ INJ
0.5000 mL | INJECTION | INTRAMUSCULAR | Status: AC
  Filled 2013-01-07 (×3): qty 0.5

## 2013-01-07 MED ORDER — ALBUTEROL SULFATE HFA 108 (90 BASE) MCG/ACT IN AERS
1.0000 | INHALATION_SPRAY | Freq: Four times a day (QID) | RESPIRATORY_TRACT | Status: DC | PRN
  Filled 2013-01-07: qty 6.7

## 2013-01-07 MED ORDER — QUETIAPINE FUMARATE 200 MG PO TABS
200.0000 mg | ORAL_TABLET | Freq: Two times a day (BID) | ORAL | Status: DC
  Administered 2013-01-07 – 2013-01-12 (×11): 200 mg via ORAL
  Filled 2013-01-07 (×12): qty 1

## 2013-01-07 NOTE — Progress Notes (Signed)
Patient ID: Erin [name]  female  HYQ:[id number]    DOB: 06/29/1966    DOA: 01/04/2013  PCP: Default, Provider, MD  Brief narrative Erin [name] is an 47 y.o. female with hx of episodic alcohol abuse, alcoholic cirrhosis, hypothyroidism, Bipolar disorder, schizophrenia, and sarcoidosis, presented to the ED on 1/10 as she was having increased tremors, nervousness, palpitation and symptoms of withdrawal. She also admitted to having increase sputum productive coughs, having subjective fever and chills. Evaluation in the ER showed tachycardia, tremors, and clinical presentation suspicious for alcohol withdrawal. She has a WBC of 1.4K, and platelet of 39K with normal hemoglobin. Her liver fx tests are elevated and her alcohol level is negative. Her CXR showed stable lymphadenopathy with no infiltrate. Hospitalist was asked to admit her for active alcohol withdrawal and upper respiratory infection.   Assessment/Plan:  Primary problem  Alcohol withdrawal DTs Patient was admitted to ICU and started on Ativan protocol. Overnight on 1/11, withdrawal worsened and patient was started on Precedex infusion by critical care medicine. Improved. Precedex drip was stopped early 1/13 am. Improving. No further hallucinations.  Alcoholic cirrhosis Abdominal ultrasound negative for ascites. Lipase normal. Complains of intermittent abdominal pain but no acute findings on exam.  Leukopenia, thrombocytopenia and macrocytosis Likely secondary to alcohol abuse. WBC and platelets low but stable. No overt bleeding. Follow daily CBCs.  Hypokalemia and hypomagnesemia Secondary to alcohol abuse. These have been corrected. Follow daily BMP.   Hyponatremia Possibly from cirrhosis. Stable. Follow daily BMP   Tobacco abuse Cessation counseled.   Bipolar 1 disorder  Currently stable, continue psych medications  Hoarseness with acute bronchitis she states that she had vocal cord surgery 5 years ago, ? Viral  etiology. Continue Levaquin for now. Improving. Patient complains of some difficulty swallowing. 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 Outpatient followup  DVT Prophylaxis: SCD's Code Status: Full Family communication: Discussed with patient and boyfriend at bedside Disposition: Transfer to telemetry bed on 1/13   Subjective: Says that she feels much better. Hoarseness improving. No hallucinations. Complains of some difficulty swallowing for? Duration  Objective: Weight change:   Intake/Output Summary (Last 24 hours) at 01/07/13 0938 Last data filed at 01/07/13 0800  Gross per 24 hour  Intake 1639.26 ml  Output   3650 ml  Net -2010.74 ml   Blood pressure 147/94, pulse 84, temperature 98.6 F (37 C), temperature source Oral, resp. rate 22, height 5\' 7"  (1.702 m), weight 113.7 kg (250 lb 10.6 oz), SpO2 96.00%.  Physical Exam: General: Alert and awake, oriented x3, not in any acute distress. Not anxious HEENT: Throat exam unremarkable. Hoarse-much better CVS: S1-S2 clear, no murmur rubs or gallops. Telemetry shows sinus rhythm Chest: clear to auscultation bilaterally, no wheezing, rales or rhonchi. No increased work of breathing. Abdomen: Soft, distended, no significant tenderness on examination. Normal bowel sounds heard.  Extremities: Symmetric 5 x 5 power. Neuro: Cranial nerves II-XII intact, no focal neurological deficits, tremors-mild-decreasing  Lab Results: Basic Metabolic Panel:  Lab 01/07/13 9528 01/06/13 1036 01/06/13 1035  NA 128* 130* --  K 4.3 4.6 --  CL 93* 97 --  CO2 25 23 --  GLUCOSE 120* 136* --  BUN 5* 4* --  CREATININE 0.66 0.71 --  CALCIUM 8.7 8.1* --  MG -- -- 1.9  PHOS -- -- --   Liver Function Tests:  Lab 01/07/13 0351 01/06/13 1036  AST 81* 86*  ALT 33 34  ALKPHOS 133* 140*  BILITOT 2.4* 2.6*  PROT 6.5 6.5  ALBUMIN 3.0* 2.9*    Lab 01/04/13 1423  AMMONIA 28    CBC:  Lab 01/07/13 0350 01/06/13 1035  WBC 2.1* 2.2*  NEUTROABS 1.3* --  HGB 13.4 12.9  HCT 39.4 38.7  MCV 99.0 100.8*  PLT 32* 37*    Micro Results: Recent Results (from the past 240 hour(s))  CULTURE, BLOOD (ROUTINE X 2)     Status: Normal (Preliminary result)   Collection Time   01/04/13  3:04 PM      Component Value Range Status Comment   Specimen Description BLOOD   Final    Special Requests BOTTLES DRAWN AEROBIC AND ANAEROBIC 10CC   Final    Culture  Setup Time 01/05/2013 01:15   Final    Culture     Final    Value:        BLOOD CULTURE RECEIVED NO GROWTH TO DATE CULTURE WILL BE HELD FOR 5 DAYS BEFORE ISSUING A FINAL NEGATIVE REPORT   Report Status PENDING   Incomplete   CULTURE, BLOOD (ROUTINE X 2)     Status: Normal (Preliminary result)   Collection Time   01/04/13  3:35 PM      Component Value Range Status Comment   Specimen Description BLOOD LEFT ARM   Final    Special Requests BOTTLES DRAWN AEROBIC AND ANAEROBIC 5CC   Final    Culture  Setup Time 01/05/2013 03:14   Final    Culture     Final    Value:        BLOOD CULTURE RECEIVED NO GROWTH TO DATE CULTURE WILL BE HELD FOR 5 DAYS BEFORE ISSUING A FINAL NEGATIVE REPORT   Report Status PENDING   Incomplete   MRSA PCR SCREENING     Status: Normal   Collection Time   01/05/13 12:33 AM      Component Value Range Status Comment   MRSA by PCR NEGATIVE  NEGATIVE Final     Studies/Results: Dg Chest 2 View  01/04/2013  *RADIOLOGY REPORT*  Clinical Data: Shortness of breath and cough  CHEST - 2 VIEW  Comparison: September 04, 2012 chest radiograph and September 01, 2012 chest CT  Findings:  Adenopathy in the right paratracheal and hilar regions appears stable.  Currently there is no edema or consolidation.  The heart size and pulmonary vascularity normal.  No bone lesions.  IMPRESSION: Stable adenopathy.  No edema or consolidation.  Nodular pulmonary lesions seen on prior chest CT are not seen on this study.  Given those  findings on CT, further surveillance remains warranted.   Original Report Authenticated By: Bretta Bang, M.D.    Ct Abdomen Pelvis W Contrast  01/04/2013  *RADIOLOGY REPORT*  Clinical Data: 47 year old female with cirrhosis.  Nausea. Alcohol abuse  CT ABDOMEN AND PELVIS WITH CONTRAST  Technique:  Multidetector CT imaging of the abdomen and pelvis was performed following the standard protocol during bolus administration of intravenous contrast.  Contrast: OMNIPAQUE IOHEXOL 300 MG/ML  SOLN  Comparison: CT 07/13/2012  Findings: Lung bases are clear.  No pericardial fluid.  There is diffuse fatty infiltration the liver.  The liver is enlarged with a lobular contour.  Portal veins are patent.  Splenic vein is enlarged.  The spleen is enlarged to a calculated volume of 512 ml. No evidence of ascites.  Pancreas is normal.  Post cholecystectomy.  Adrenal glands and kidneys are normal.  There is perinephric stranding  surrounding the kidneys which is similar to prior.  No obstructive uropathy.  The stomach, small bowel, appendix, and cecum are normal.  The colon is collapsed.  No acute findings.  Abdominal aorta is normal caliber.  There are shotty retroperitoneal periaortic lymph nodes similar to prior.  The bladder is normal.  Post hysterectomy anatomy.  There is a small ventral hernia in the lower abdomen just above the pubic symphysis which contains a short segment of nonobstructed small bowel (image 75).  This is similar to prior.  Post hysterectomy anatomy.  The ovaries are normal. Review of  bone windows demonstrates no aggressive osseous lesions.  IMPRESSION:  1.  No acute abdominal or pelvic findings.  No significant change from prior. 2.  Sequelae of cirrhosis with portal hypertension.  No ascites. 3.  Small midline ventral hernia contains a short segment of nonobstructed small bowel just above the pubic symphysis.   Original Report Authenticated By: Genevive Bi, M.D.    Limited abdominal  ultrasound for ascites  IMPRESSION:  Negative ultrasound for ascites.  Medications: Scheduled Meds:    . antiseptic oral rinse  15 mL Mouth Rinse q12n4p  . buPROPion  150 mg Oral Daily  . chlorhexidine  15 mL Mouth Rinse BID  . docusate sodium  100 mg Oral BID  . DULoxetine  60 mg Oral BID  . folic acid  1 mg Oral Daily  . furosemide  20 mg Oral Daily  . gabapentin  800 mg Oral TID  . levofloxacin (LEVAQUIN) IV  500 mg Intravenous QHS  . LORazepam  1 mg Intravenous Q6H  . multivitamin with minerals  1 tablet Oral Daily  . sodium chloride  3 mL Intravenous Q12H  . thiamine  100 mg Oral Daily   Or  . thiamine  100 mg Intravenous Daily      LOS: 3 days   [facility] M.D. [facility] 01/07/2013, 9:38 AM Pager: [phone]  If 7PM-7AM, please contact night-coverage [email or web address] Password [email or web address]

## 2013-01-07 NOTE — Evaluation (Signed)
Clinical/Bedside Swallow Evaluation Patient Details  Name: Erin [name] MRN: [id number] Date of Birth: 01-Dec-1966  Today's Date: 01/07/2013 Time: 1029-     Past Medical History:  Past Medical History  Diagnosis Date  . Hypothyroidism   . Blood transfusion July 2012  . Depression   . Anxiety   . Bipolar 1 disorder   . Menorrhagia   . Liver failure   . Bronchitis   . Cirrhosis of liver    Past Surgical History:  Past Surgical History  Procedure Date  . Tubal ligation   . Laparoscopy   . Orthopedic surgery   . Cholecystectomy   . Fracture surgery   . Hernia repair   . Vaginal hysterectomy 07/27/2011    Procedure: HYSTERECTOMY VAGINAL;  Surgeon: Scheryl Darter, MD;  Location: [facility];  Service: Gynecology;  Laterality: N/A;  Vaginal Hysterectomy with Right Oophorectomy  . Laparotomy 07/28/2011    Procedure: EXPLORATORY LAPAROTOMY;  Surgeon: Tilda Burrow, MD;  Location: [facility];  Service: Gynecology;  Laterality: N/A;   HPI:  47 yo woman, hx sarcoidosis, schizophrenia, bipolar, EtOH abuse, cirrhosis. Admitted 1/10 for EtOH detox and withdrawal, URI symptoms. Progressive delirium and hallucinations, started on precedex 1/11. CCM consulted 1/12.    Assessment / Plan / Recommendation Clinical Impression  Patient appears to have a functional swallow, with timely swallow initiation, adequate laryngeal elevation palpated, and no cough or throat clearing noted after the swallow.  Patient did complain of the cracker "sticking" in her throat, but stated it cleared after a sip of water.  Patient exhibits a hoarse vocal quality, and reports this is worse in am, but improves as the day progresses.  Patient reports that she frequently has no voice when she first wakes up and for several hours in am.  Question if f/u ENT consult may be indicated.      Aspiration Risk  Mild    Diet Recommendation Regular;Thin liquid   Liquid Administration via: Straw;Cup Medication Administration: Whole  meds with liquid Supervision: Intermittent supervision to cue for compensatory strategies Compensations: Slow rate;Small sips/bites;Follow solids with liquid Postural Changes and/or Swallow Maneuvers: Out of bed for meals;Seated upright 90 degrees;Upright 30-60 min after meal    Other  Recommendations Recommended Consults: Consider ENT evaluation Oral Care Recommendations: Oral care QID;Patient independent with oral care Other Recommendations: Clarify dietary restrictions   Follow Up Recommendations  None    Frequency and Duration        Pertinent Vitals/Pain n/a    SLP Swallow Goals     Swallow Study Prior Functional Status       General HPI: 47 yo woman, hx sarcoidosis, schizophrenia, bipolar, EtOH abuse, cirrhosis. Admitted 1/10 for EtOH detox and withdrawal, URI symptoms. Progressive delirium and hallucinations, started on precedex 1/11. CCM consulted 1/12.  Type of Study: Bedside swallow evaluation Diet Prior to this Study: Regular;Thin liquids Temperature Spikes Noted: No Respiratory Status: Room air History of Recent Intubation: No Behavior/Cognition: Alert;Cooperative;Distractible;Decreased sustained attention;Requires cueing Oral Cavity - Dentition: Adequate natural dentition Self-Feeding Abilities: Able to feed self Patient Positioning: Upright in bed Baseline Vocal Quality: Hoarse Volitional Cough: Strong Volitional Swallow: Able to elicit    Oral/Motor/Sensory Function Overall Oral Motor/Sensory Function: Appears within functional limits for tasks assessed   Ice Chips Ice chips: Within functional limits Presentation: Spoon   Thin Liquid Thin Liquid: Within functional limits Presentation: Spoon;Cup;Straw;Self Fed    Nectar Thick Nectar Thick Liquid: Within functional limits Presentation: Spoon;Cup   Honey Thick Honey Thick Liquid:  Not tested   Puree Puree: Within functional limits   Solid   GO    Solid: Impaired Presentation: Self Fed Pharyngeal Phase  Impairments:  (C/O cracker sticking in throat.  Clears with sip of H2O)       Sem Mccaughey T 01/07/2013,10:59 AM

## 2013-01-07 NOTE — Plan of Care (Signed)
Problem: Phase I Progression Outcomes Goal: OOB as tolerated unless otherwise ordered Outcome: Progressing Pt c/o abdominal pain from abdominal surgery approx 8 months ago, when in chair position.  Tolerates for to 1hr before requesting to lie back down.

## 2013-01-07 NOTE — Progress Notes (Signed)
PULMONARY  / CRITICAL CARE MEDICINE  Name: Erin [name] MRN: [id number] DOB: 1966-10-13    LOS: 3  REFERRING MD :  Hongalgi  CHIEF COMPLAINT:  EtOH withdrawal delirium  BRIEF PATIENT DESCRIPTION: 47 yo woman, hx sarcoidosis, schizophrenia, bipolar, EtOH abuse, cirrhosis. Admitted 1/10 for EtOH detox and withdrawal, URI symptoms. Progressive delirium and hallucinations, started on precedex 1/11. CCM consulted 1/12.   LINES / TUBES:  CULTURES: Blood x 2 1/10 >>  MRSA screen 1/10 >> negative  ANTIBIOTICS: levaquin 1/10 >>   SIGNIFICANT EVENTS:  1/11 precedex started  LEVEL OF CARE:  SDU PRIMARY SERVICE:  [facility]  CONSULTANTS:  PCCM CODE STATUS: Full DIET:  clears DVT Px:   GI Px:    INTERVAL HISTORY:  feeling better although still some hallucinations last night precedex turned off this am at 0730 Hoarse voice and some nasal lesions characteristic of her sarcoidosis  VITAL SIGNS: Temp:  [97.9 F (36.6 C)-99.7 F (37.6 C)] 99.7 F (37.6 C) (01/13 0800) Pulse Rate:  [77-88] 84  (01/13 0755) Resp:  [16-23] 22  (01/13 0755) BP: (116-147)/(76-97) 147/94 mmHg (01/13 0755) SpO2:  [92 %-97 %] 96 % (01/13 0755) HEMODYNAMICS:   VENTILATOR SETTINGS:   INTAKE / OUTPUT: Intake/Output      01/12 0701 - 01/13 0700 01/13 0701 - 01/14 0700   P.O. 720    I.V. (mL/kg) 922.1 (8.1) 20 (0.2)   IV Piggyback 150    Total Intake(mL/kg) 1792.1 (15.8) 20 (0.2)   Urine (mL/kg/hr) 3350 (1.2) 300   Total Output 3350 300   Net -1557.9 -280          PHYSICAL EXAMINATION: General:  Obese, NAD Neuro:  Awake, alert, oriented, follows commands, moves all ext HEENT:  Op clear, no stridor but very hoarse voice Cardiovascular:  regular, no M Lungs:  Clear, decreased at bases Abdomen:  Obese, NT, + BS Musculoskeletal:  No deformities Skin:  No rash   LABS: Cbc  Lab 01/07/13 0350 01/06/13 1035 01/05/13 0330  WBC 2.1* -- --  HGB 13.4 12.9 12.3  HCT 39.4 38.7 35.9*  PLT 32* 37*  36*    Chemistry   Lab 01/07/13 0351 01/06/13 1036 01/06/13 1035 01/05/13 0330  NA 128* 130* -- 134*  K 4.3 4.6 -- 3.0*  CL 93* 97 -- 96  CO2 25 23 -- 26  BUN 5* 4* -- 5*  CREATININE 0.66 0.71 -- 0.72  CALCIUM 8.7 8.1* -- 7.9*  MG -- -- 1.9 1.1*  PHOS -- -- -- --  GLUCOSE 120* 136* -- 138*    Liver fxn  Lab 01/07/13 0351 01/06/13 1036 01/05/13 0330  AST 81* 86* 124*  ALT 33 34 41*  ALKPHOS 133* 140* 148*  BILITOT 2.4* 2.6* 2.9*  PROT 6.5 6.5 6.1  ALBUMIN 3.0* 2.9* 3.1*   coags No results found for this basename: APTT:3,INR:3 in the last 168 hours Sepsis markers  Lab 01/04/13 1504  LATICACIDVEN 1.8  PROCALCITON --   Cardiac markers No results found for this basename: CKTOTAL:3,CKMB:3,TROPONINI:3 in the last 168 hours BNP No results found for this basename: PROBNP:3 in the last 168 hours ABG No results found for this basename: PHART:3,PCO2ART:3,PO2ART:3,HCO3:3,TCO2:3 in the last 168 hours  CBG trend No results found for this basename: GLUCAP:5 in the last 168 hours  IMAGING: US Abdomen Limited  01/05/2013  *RADIOLOGY REPORT*  Clinical Data: Cirrhosis and abdominal distention.  LIMITED ABDOMEN ULTRASOUND FOR ASCITES  Technique:  Limited ultrasound survey for ascites  was performed in all four abdominal quadrants.  Comparison:  CT scan 01/04/2013.  Findings: No abdominal/pelvic ascites is demonstrated.  IMPRESSION: Negative ultrasound for ascites.   Original Report Authenticated By: Rudie Meyer, M.D.     ECG:  DIAGNOSES: Active Problems:  Sarcoidosis  HYPOTHYROIDISM  TOBACCO DEPENDENCE  CIRRHOSIS, ALCOHOLIC, LIVER  HX, PERSONAL, SCHIZOPHRENIA  Bipolar 1 disorder  Acute URI  Hypokalemia  Hoarseness with URI symptoms  Alcohol withdrawal hallucinosis  Leukopenia  Thrombocytopenia   ASSESSMENT / PLAN:  PULMONARY  ASSESSMENT: Sarcoidosis with hx LAD and mild (stable) nodular disease, hx nasal and pharyngeal polyps PLAN:   - she has hoarse voice and  ? Nasal involvement but I do not want to try to treat w steroids in fragile patient already dealing with another reason to have delirium. May need to revisit prednisone in the future, either at the end of this hospitalization or as an outpatient.   CARDIOVASCULAR  ASSESSMENT:  HTN PLAN:  - home lasix  RENAL  ASSESSMENT:   Hyponatremia  PLAN:   - follow BMP  GASTROINTESTINAL  ASSESSMENT:   Cirrhosis, due to EtOH, LFT improving PLAN:   - follow LFT and INR  HEMATOLOGIC  ASSESSMENT:   Thrombocytopenia Leukopenia, likely due to EtOH  PLAN:   INFECTIOUS  ASSESSMENT:   URI, ? bronchitis PLAN:   - started on levaquin by primary service  ENDOCRINE  ASSESSMENT:  hyperglycemia   PLAN:   - follow glucose, consider CBG's if remain elevated  NEUROLOGIC  ASSESSMENT:   EtOH withdrawal, no difficulty with airway protection or excessive agitation at this time Schizophrenia, bipolar disease PLAN:   - precedex drip titrated per protocol, now to off - ativan scheduled and prn - folate, thiamine   I have personally obtained a history, examined the patient, evaluated laboratory and imaging results, formulated the assessment and plan and placed orders.   Levy Pupa, MD, PhD 01/07/2013, 9:43 AM [facility] [phone] or if no answer [phone]

## 2013-01-07 NOTE — Progress Notes (Signed)
Patient is very resistant to following directions. Is standing up and down without assistance. Bed alarm is on, but patient gets out of the bed very quickly. Have educated patient multiple times to press the call light and wait for the nurse or nurse tech before standing up. Patient is alert and oriented and will appropriately answer questions but is not following directions.  Have medicated with ativan for anxiety. Will continue to closely monitor patient. Erskin Burnet RN

## 2013-01-08 DIAGNOSIS — R410 Disorientation, unspecified: Secondary | ICD-10-CM | POA: Diagnosis present

## 2013-01-08 LAB — COMPREHENSIVE METABOLIC PANEL
ALT: 32 U/L (ref 0–35)
AST: 76 U/L — ABNORMAL HIGH (ref 0–37)
Alkaline Phosphatase: 130 U/L — ABNORMAL HIGH (ref 39–117)
CO2: 26 mEq/L (ref 19–32)
Chloride: 91 mEq/L — ABNORMAL LOW (ref 96–112)
Creatinine, Ser: 0.86 mg/dL (ref 0.50–1.10)
GFR calc non Af Amer: 79 mL/min — ABNORMAL LOW (ref >90)
Total Bilirubin: 3.2 mg/dL — ABNORMAL HIGH (ref 0.3–1.2)

## 2013-01-08 LAB — CBC
MCV: 98.3 fL (ref 78.0–100.0)
Platelets: 42 10*3/uL — ABNORMAL LOW (ref 150–400)
RBC: 4.06 MIL/uL (ref 3.87–5.11)
WBC: 3.5 10*3/uL — ABNORMAL LOW (ref 4.0–10.5)

## 2013-01-08 LAB — MRSA PCR SCREENING: MRSA by PCR: NEGATIVE

## 2013-01-08 MED ORDER — DEXMEDETOMIDINE HCL IN NACL 200 MCG/50ML IV SOLN
0.2000 ug/kg/h | INTRAVENOUS | Status: DC
  Filled 2013-01-08: qty 50

## 2013-01-08 MED ORDER — HALOPERIDOL LACTATE 5 MG/ML IJ SOLN
1.0000 mg | INTRAMUSCULAR | Status: DC | PRN
  Administered 2013-01-08: 4 mg via INTRAVENOUS
  Administered 2013-01-09 – 2013-01-10 (×4): 2 mg via INTRAVENOUS
  Filled 2013-01-08 (×6): qty 1

## 2013-01-08 MED ORDER — LORAZEPAM 2 MG/ML IJ SOLN
1.0000 mg | Freq: Three times a day (TID) | INTRAMUSCULAR | Status: DC
  Administered 2013-01-09 – 2013-01-11 (×4): 1 mg via INTRAVENOUS
  Filled 2013-01-08 (×5): qty 1

## 2013-01-08 MED ORDER — LEVOFLOXACIN 500 MG PO TABS
500.0000 mg | ORAL_TABLET | Freq: Every day | ORAL | Status: AC
  Administered 2013-01-09 (×2): 500 mg via ORAL
  Filled 2013-01-08 (×2): qty 1

## 2013-01-08 MED ORDER — LORAZEPAM 2 MG/ML IJ SOLN
1.0000 mg | Freq: Once | INTRAMUSCULAR | Status: AC
  Administered 2013-01-08: 1 mg via INTRAVENOUS

## 2013-01-08 MED ORDER — LORAZEPAM 2 MG/ML IJ SOLN
2.0000 mg | Freq: Four times a day (QID) | INTRAMUSCULAR | Status: DC
  Filled 2013-01-08: qty 1

## 2013-01-08 MED ORDER — POTASSIUM CHLORIDE CRYS ER 20 MEQ PO TBCR
40.0000 meq | EXTENDED_RELEASE_TABLET | Freq: Once | ORAL | Status: AC
  Administered 2013-01-08: 40 meq via ORAL
  Filled 2013-01-08: qty 2

## 2013-01-08 MED ORDER — LORAZEPAM 2 MG/ML IJ SOLN: 2.0000 mg | Freq: Once | INTRAMUSCULAR | Status: DC | PRN

## 2013-01-08 MED ORDER — LORAZEPAM 2 MG/ML IJ SOLN
1.0000 mg | Freq: Three times a day (TID) | INTRAMUSCULAR | Status: DC
  Administered 2013-01-08: 1 mg via INTRAVENOUS

## 2013-01-08 NOTE — Progress Notes (Signed)
PHARMACIST - PHYSICIAN COMMUNICATION DR:  Waymon Amato CONCERNING: Antibiotic IV to Oral Route Change Policy  RECOMMENDATION: This patient is receiving Levaquin by the intravenous route.  Based on criteria approved by the Pharmacy and Therapeutics Committee, the antibiotic(s) is/are being converted to the equivalent oral dose form(s).   DESCRIPTION: These criteria include:  Patient being treated for a respiratory tract infection, urinary tract infection, or cellulitis  The patient is not neutropenic and does not exhibit a GI malabsorption state  The patient is eating (either orally or via tube) and/or has been taking other orally administered medications for a least 24 hours  The patient is improving clinically and has a Tmax < 100.5  If you have questions about this conversion, please contact the Pharmacy Department  []   [phone] )  [facility] []   [phone] )  [name]  []   [phone] )  [facility] [x]   [phone] )  [facility]   Loralee Pacas, PharmD, BCPS 01/08/2013 2:11 PM

## 2013-01-08 NOTE — Progress Notes (Signed)
   CARE MANAGEMENT NOTE 01/08/2013  Patient:  Erin [name], Erin [name]   Account Number:  [id number]  Date Initiated:  01/08/2013  Documentation initiated by:  Jiles Crocker  Subjective/Objective Assessment:   ADMITTED WITH ALCOHOL WITHDRAWL     Action/Plan:   PATIENT RESIDES IN AN ASSISTED LIVING FACILITY; SOC WORKER REFERRAL PLACED   Anticipated DC Date:  01/12/2013   Anticipated DC Plan:  ASSISTED LIVING / REST HOME  In-house referral  Clinical Social Worker      DC Associate Professor  CM consult          Status of service:  In process, will continue to follow Medicare Important Message given?  NA - LOS <3 / Initial given by admissions (If response is "NO", the following Medicare IM given date fields will be blank)  Per UR Regulation:  Reviewed for med. necessity/level of care/duration of stay  Comments:  01/08/2013- B Javaris Wigington RN,BSN,MHA

## 2013-01-08 NOTE — Consult Note (Signed)
Reason for Consult:drug abuse, disorganized Referring Physician: unknown  Erin [name] is an 47 y.o. female.  HPI: Erin [name] is an 47 y.o. female with hx of alcohol dep, cannbis abuse, cocaine abuse, alcoholic cirrhosis, hypothyroidism, Bipolar disorder, schizophrenia, and sarcoidosis, presents to the ER as she was having increased tremors, nervousness, palpitation and symptoms of withdrawal. She also admitted to having increase sputum productive coughs, having subjective fever and chills. Evaluation in the ER showed tachycardia, tremors, and clinical presentation suspicious for alcohol withdrawal. She has a WBC of 1.4K, and platelet of 39K with normal hemoglobin. Her liver fx tests are elevated and her alcohol level is negative. Her CXR showed stable lymphadenopathy with no infiltrate. Hospitalist was asked to admit her for active alcohol withdrawal and upper respiratory infection.   Seen today and chart was reviewed. Very confused and in 2 point restraints now.  She is not sure where she is now and also why she is here. Not sure if she has medical problems. Not able to give much relevant hx now.   Past Medical History  Diagnosis Date  . Hypothyroidism   . Blood transfusion July 2012  . Depression   . Anxiety   . Bipolar 1 disorder   . Menorrhagia   . Liver failure   . Bronchitis   . Cirrhosis of liver     Past Surgical History  Procedure Date  . Tubal ligation   . Laparoscopy   . Orthopedic surgery   . Cholecystectomy   . Fracture surgery   . Hernia repair   . Vaginal hysterectomy 07/27/2011    Procedure: HYSTERECTOMY VAGINAL;  Surgeon: Scheryl Darter, MD;  Location: [facility];  Service: Gynecology;  Laterality: N/A;  Vaginal Hysterectomy with Right Oophorectomy  . Laparotomy 07/28/2011    Procedure: EXPLORATORY LAPAROTOMY;  Surgeon: Tilda Burrow, MD;  Location: [facility];  Service: Gynecology;  Laterality: N/A;    Family History  Problem Relation Age of Onset  . Diabetes  Mother   . Hypertension Mother   . Diabetes Daughter   . Hypertension Daughter     Social History:  reports that she has been smoking.  She does not have any smokeless tobacco history on file. She reports that she drinks alcohol. She reports that she does not use illicit drugs.  Allergies:  Allergies  Allergen Reactions  . Fluoxetine Hcl Other (See Comments)     hyperactivity  . Metoclopramide Hcl Other (See Comments)    depression  . Morphine And Related Other (See Comments)    High doses cause hallucinations    Medications: I have reviewed the patient's current medications.  Results for orders placed during the hospital encounter of 01/04/13 (from the past 48 hour(s))  CBC WITH DIFFERENTIAL     Status: Abnormal   Collection Time   01/07/13  3:50 AM      Component Value Range Comment   WBC 2.1 (*) 4.0 - 10.5 K/uL    RBC 3.98  3.87 - 5.11 MIL/uL    Hemoglobin 13.4  12.0 - 15.0 g/dL    HCT 40.9  81.1 - 91.4 %    MCV 99.0  78.0 - 100.0 fL    MCH 33.7  26.0 - 34.0 pg    MCHC 34.0  30.0 - 36.0 g/dL    RDW 78.2  95.6 - 21.3 %    Platelets 32 (*) 150 - 400 K/uL CONSISTENT WITH PREVIOUS RESULT   Neutrophils Relative 63  43 - 77 %  Neutro Abs 1.3 (*) 1.7 - 7.7 K/uL    Lymphocytes Relative 24  12 - 46 %    Lymphs Abs 0.5 (*) 0.7 - 4.0 K/uL    Monocytes Relative 9  3 - 12 %    Monocytes Absolute 0.2  0.1 - 1.0 K/uL    Eosinophils Relative 4  0 - 5 %    Eosinophils Absolute 0.1  0.0 - 0.7 K/uL    Basophils Relative 1  0 - 1 %    Basophils Absolute 0.0  0.0 - 0.1 K/uL   COMPREHENSIVE METABOLIC PANEL     Status: Abnormal   Collection Time   01/07/13  3:51 AM      Component Value Range Comment   Sodium 128 (*) 135 - 145 mEq/L    Potassium 4.3  3.5 - 5.1 mEq/L    Chloride 93 (*) 96 - 112 mEq/L    CO2 25  19 - 32 mEq/L    Glucose, Bld 120 (*) 70 - 99 mg/dL    BUN 5 (*) 6 - 23 mg/dL    Creatinine, Ser 4.09  0.50 - 1.10 mg/dL    Calcium 8.7  8.4 - 81.1 mg/dL    Total Protein  6.5  6.0 - 8.3 g/dL    Albumin 3.0 (*) 3.5 - 5.2 g/dL    AST 81 (*) 0 - 37 U/L    ALT 33  0 - 35 U/L    Alkaline Phosphatase 133 (*) 39 - 117 U/L    Total Bilirubin 2.4 (*) 0.3 - 1.2 mg/dL    GFR calc non Af Amer >90  >90 mL/min    GFR calc Af Amer >90  >90 mL/min   CBC     Status: Abnormal   Collection Time   01/08/13  5:15 AM      Component Value Range Comment   WBC 3.5 (*) 4.0 - 10.5 K/uL    RBC 4.06  3.87 - 5.11 MIL/uL    Hemoglobin 13.9  12.0 - 15.0 g/dL    HCT 91.4  78.2 - 95.6 %    MCV 98.3  78.0 - 100.0 fL    MCH 34.2 (*) 26.0 - 34.0 pg    MCHC 34.8  30.0 - 36.0 g/dL    RDW 21.3  08.6 - 57.8 %    Platelets 42 (*) 150 - 400 K/uL   COMPREHENSIVE METABOLIC PANEL     Status: Abnormal   Collection Time   01/08/13  5:15 AM      Component Value Range Comment   Sodium 128 (*) 135 - 145 mEq/L    Potassium 3.4 (*) 3.5 - 5.1 mEq/L    Chloride 91 (*) 96 - 112 mEq/L    CO2 26  19 - 32 mEq/L    Glucose, Bld 110 (*) 70 - 99 mg/dL    BUN 12  6 - 23 mg/dL    Creatinine, Ser 4.69  0.50 - 1.10 mg/dL    Calcium 8.8  8.4 - 62.9 mg/dL    Total Protein 6.7  6.0 - 8.3 g/dL    Albumin 3.2 (*) 3.5 - 5.2 g/dL    AST 76 (*) 0 - 37 U/L    ALT 32  0 - 35 U/L    Alkaline Phosphatase 130 (*) 39 - 117 U/L    Total Bilirubin 3.2 (*) 0.3 - 1.2 mg/dL    GFR calc non Af Amer 79 (*) >90 mL/min  GFR calc Af Amer >90  >90 mL/min     No results found.  Review of Systems  Gastrointestinal: Positive for blood in stool.   Blood pressure 111/80, pulse 111, temperature 99.1 F (37.3 C), temperature source Oral, resp. rate 20, height 5\' 7"  (1.702 m), weight 111.585 kg (246 lb), SpO2 93.00%. Physical Exam  MSE:   Appearance:  disheveled and overweight  Behavior:  confused Speech:  Fast at times  Mood:  anxious  Affect: labile Thought Process: diorganized Thought Content:  No si  Sensorium:  Person only  Cognition:  poor Insight:  limited  NO SI, NO HI, NO AVH    Assessment/    Axis I: delirium nos, hx of  alcohol dep, cannbis abuse, cocaine abuse  Axis II: deferred  Axis III: see med hx  Axis IV: unknown Axis V: 20  Plan:   1. recommend to continue work up for AMS. Pt is not at her baseline 2. Continue Seroquel. Will consider increasing later 3. Consider Zyprexa 5-10 mg Q12 PRN for agitation if needed 4. Will follow   Wonda Cerise 01/08/2013, 2:20 PM
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
## 2013-01-08 NOTE — Progress Notes (Addendum)
The patient with h/o DTs returning to ICU for agitation   WE will initiate precedex. She might benefit from psychiatry evaluation.   I discussed with Dr. Roselee Nova and the patient's delirium might not be related to DTs but to her underlying psychiatric disorder. Now awaiting psychiatry evaluation.   We will discontinue the Precedex and continue prn Ativan and Haldol.

## 2013-01-08 NOTE — Progress Notes (Signed)
Patient ID: Erin [name]  female  VOZ:[id number]    DOB: 04-May-1966    DOA: 01/04/2013  PCP: Default, Provider, MD  Brief narrative Erin [name] is an 47 y.o. female with hx of episodic alcohol abuse, alcoholic cirrhosis, hypothyroidism, Bipolar disorder, schizophrenia, and sarcoidosis, presented to the ED on 1/10 as she was having increased tremors, nervousness, palpitation and symptoms of withdrawal. She also admitted to having increase sputum productive coughs, having subjective fever and chills. Evaluation in the ER showed tachycardia, tremors, and clinical presentation suspicious for alcohol withdrawal. She has a WBC of 1.4K, and platelet of 39K with normal hemoglobin. Her liver fx tests are elevated and her alcohol level is negative. Her CXR showed stable lymphadenopathy with no infiltrate. Hospitalist was asked to admit her for active alcohol withdrawal and upper respiratory infection.   Assessment/Plan:  Primary problem  Delirium/alcohol withdrawal DTs, acute psychosis Patient was initially admitted to ICU and started on Ativan protocol. Overnight on 1/11, withdrawal worsened and patient was started on Precedex infusion by critical care medicine. Improved. Precedex drip was stopped early 1/13 am. She was transferred to telemetry on 1/13. Since 1/14 morning, she has progressively worsened-increased confusion, worsening hallucinations-seen her demised family members and other objects, pacing in the room and floor, wandered off the hospital and had to be brought back by security. Apparently there is history of hallucinations even at baseline. Current situation may be multifactorial but does not seem all alcohol withdrawal predominantly. In any event, she will need close monitoring and hence we'll transfer to ICU. Discussed with critical care team-initially plan was to resume Precedex drip but they also think that this is more acute psychosis and recommend psychiatric consultation(already  called), when necessary Haldol rather than Precedex drip. Patient also abuses street drugs-at least benzodiazepines. Will keep on low dose IV Ativan for alcohol withdrawal/?? Benzodiazepine withdrawal  Alcoholic cirrhosis Abdominal ultrasound negative for ascites. Lipase normal. Does not complain of abdominal pain today.  Leukopenia, thrombocytopenia and macrocytosis Likely secondary to alcohol abuse. WBC and platelets low but stable/improving. No overt bleeding. Follow daily CBCs.  Hypokalemia and hypomagnesemia Secondary to alcohol abuse. Replete and follow as needed.   Hyponatremia Possibly from cirrhosis. Stable. Follow daily BMP   Tobacco abuse Cessation counseled.   Bipolar 1 disorder/? Schizophrenia per son  Please see management above.  Hoarseness with acute bronchitis She states that she had vocal cord surgery 5 years ago, ? Viral etiology. Continue Levaquin for now. Improving. ST consulted and did not see any swallowing issues.  Sarcoidosis with history of LAD and mild stable nodular disease. Outpatient followup  DVT Prophylaxis: SCD's Code Status: Full Family communication: Discussed with patient and son Bo at bedside. Disposition: Transfer to ICU on 1/14   Subjective: Increased confusion, restlessness and hallucinations-seeing multiple demised family members and shapes. Denies suicidal or homicidal ideations.  Objective: Weight change:   Intake/Output Summary (Last 24 hours) at 01/08/13 1805 Last data filed at 01/08/13 0818  Gross per 24 hour  Intake    600 ml  Output      0 ml  Net    600 ml   Blood pressure 128/92, pulse 104, temperature 99.1 F (37.3 C), temperature source Oral, resp. rate 21, height 5\' 7"  (1.702 m), weight 111.8 kg (246 lb 7.6 oz), SpO2 96.00%.  Physical Exam: General: Alert and awake, oriented x2, not in any acute distress. Mildly anxious HEENT: Hoarse-continues to improve CVS: S1-S2 clear, no murmur rubs or gallops. Telemetry shows  sinus rhythm to sinus tachycardia in 120s. Chest: clear to auscultation bilaterally, no wheezing, rales or rhonchi. No increased work of breathing. Abdomen: Soft, distended, no significant tenderness on examination. Normal bowel sounds heard.  Extremities: Symmetric 5 x 5 power. Neuro: Cranial nerves II-XII intact, no focal neurological deficits, tremors-mild Psychiatry: Active visual hallucinations.  Lab Results: Basic Metabolic Panel:  Lab 01/08/13 9604 01/07/13 0351 01/06/13 1035  NA 128* 128* --  K 3.4* 4.3 --  CL 91* 93* --  CO2 26 25 --  GLUCOSE 110* 120* --  BUN 12 5* --  CREATININE 0.86 0.66 --  CALCIUM 8.8 8.7 --  MG -- -- 1.9  PHOS -- -- --   Liver Function Tests:  Lab 01/08/13 0515 01/07/13 0351  AST 76* 81*  ALT 32 33  ALKPHOS 130* 133*  BILITOT 3.2* 2.4*  PROT 6.7 6.5  ALBUMIN 3.2* 3.0*    Lab 01/04/13 1423  AMMONIA 28   CBC:  Lab 01/08/13 0515 01/07/13 0350  WBC 3.5* 2.1*  NEUTROABS -- 1.3*  HGB 13.9 13.4  HCT 39.9 39.4  MCV 98.3 99.0  PLT 42* 32*    Micro Results: Recent Results (from the past 240 hour(s))  CULTURE, BLOOD (ROUTINE X 2)     Status: Normal (Preliminary result)   Collection Time   01/04/13  3:04 PM      Component Value Range Status Comment   Specimen Description BLOOD   Final    Special Requests BOTTLES DRAWN AEROBIC AND ANAEROBIC 10CC   Final    Culture  Setup Time 01/05/2013 01:15   Final    Culture     Final    Value:        BLOOD CULTURE RECEIVED NO GROWTH TO DATE CULTURE WILL BE HELD FOR 5 DAYS BEFORE ISSUING A FINAL NEGATIVE REPORT   Report Status PENDING   Incomplete   CULTURE, BLOOD (ROUTINE X 2)     Status: Normal (Preliminary result)   Collection Time   01/04/13  3:35 PM      Component Value Range Status Comment   Specimen Description BLOOD LEFT ARM   Final    Special Requests BOTTLES DRAWN AEROBIC AND ANAEROBIC 5CC   Final    Culture  Setup Time 01/05/2013 03:14   Final    Culture     Final    Value:        BLOOD  CULTURE RECEIVED NO GROWTH TO DATE CULTURE WILL BE HELD FOR 5 DAYS BEFORE ISSUING A FINAL NEGATIVE REPORT   Report Status PENDING   Incomplete   MRSA PCR SCREENING     Status: Normal   Collection Time   01/05/13 12:33 AM      Component Value Range Status Comment   MRSA by PCR NEGATIVE  NEGATIVE Final     Studies/Results: Dg Chest 2 View  01/04/2013  *RADIOLOGY REPORT*  Clinical Data: Shortness of breath and cough  CHEST - 2 VIEW  Comparison: September 04, 2012 chest radiograph and September 01, 2012 chest CT  Findings:  Adenopathy in the right paratracheal and hilar regions appears stable.  Currently there is no edema or consolidation.  The heart size and pulmonary vascularity normal.  No bone lesions.  IMPRESSION: Stable adenopathy.  No edema or consolidation.  Nodular pulmonary lesions seen on prior chest CT are not seen on this study.  Given those findings on CT, further surveillance remains warranted.   Original Report Authenticated By: Bretta Bang, M.D.  Ct Abdomen Pelvis W Contrast  01/04/2013  *RADIOLOGY REPORT*  Clinical Data: 47 year old female with cirrhosis.  Nausea. Alcohol abuse  CT ABDOMEN AND PELVIS WITH CONTRAST  Technique:  Multidetector CT imaging of the abdomen and pelvis was performed following the standard protocol during bolus administration of intravenous contrast.  Contrast: OMNIPAQUE IOHEXOL 300 MG/ML  SOLN  Comparison: CT 07/13/2012  Findings: Lung bases are clear.  No pericardial fluid.  There is diffuse fatty infiltration the liver.  The liver is enlarged with a lobular contour.  Portal veins are patent.  Splenic vein is enlarged.  The spleen is enlarged to a calculated volume of 512 ml. No evidence of ascites.  Pancreas is normal.  Post cholecystectomy.  Adrenal glands and kidneys are normal.  There is perinephric stranding surrounding the kidneys which is similar to prior.  No obstructive uropathy.  The stomach, small bowel, appendix, and cecum are normal.  The  colon is collapsed.  No acute findings.  Abdominal aorta is normal caliber.  There are shotty retroperitoneal periaortic lymph nodes similar to prior.  The bladder is normal.  Post hysterectomy anatomy.  There is a small ventral hernia in the lower abdomen just above the pubic symphysis which contains a short segment of nonobstructed small bowel (image 75).  This is similar to prior.  Post hysterectomy anatomy.  The ovaries are normal. Review of  bone windows demonstrates no aggressive osseous lesions.  IMPRESSION:  1.  No acute abdominal or pelvic findings.  No significant change from prior. 2.  Sequelae of cirrhosis with portal hypertension.  No ascites. 3.  Small midline ventral hernia contains a short segment of nonobstructed small bowel just above the pubic symphysis.   Original Report Authenticated By: Genevive Bi, M.D.    Limited abdominal ultrasound for ascites  IMPRESSION:  Negative ultrasound for ascites.  Medications: Scheduled Meds:    . antiseptic oral rinse  15 mL Mouth Rinse q12n4p  . buPROPion  150 mg Oral Daily  . chlorhexidine  15 mL Mouth Rinse BID  . docusate sodium  100 mg Oral BID  . DULoxetine  60 mg Oral BID  . folic acid  1 mg Oral Daily  . furosemide  20 mg Oral Daily  . gabapentin  800 mg Oral TID  . levofloxacin  500 mg Oral QHS  . multivitamin with minerals  1 tablet Oral Daily  . pneumococcal 23 valent vaccine  0.5 mL Intramuscular Tomorrow-1000  . QUEtiapine  200 mg Oral BID  . sodium chloride  3 mL Intravenous Q12H  . thiamine  100 mg Oral Daily      LOS: 4 days   [facility] M.D. [facility] 01/08/2013, 6:05 PM Pager: [phone]  If 7PM-7AM, please contact night-coverage [email or web address] Password [email or web address]

## 2013-01-08 NOTE — Progress Notes (Signed)
Pt still agitated & restless. Restraint order was obtained from MD. Applied 4 point & waist belt initially around 1330 Son present & explained the need for restraints. Will cont to monitor.

## 2013-01-08 NOTE — Progress Notes (Signed)
Pt was seen by housekeeping escaped on fire exit door around 1215 and notified the desk. Security was called to search on this pt. Director, asst director ,[facility] & charge nurse aware. No sitter available in the house since this am. Pt was finally found after 20-30mins outside by the security & was escorted back in the room. MD was called. See new orders for Ativan prn. Possibly needing restraints. Cont to monitor.

## 2013-01-08 NOTE — Progress Notes (Signed)
Pt transferred to stepdown for further monitoring per MD order; report called to Misty Stanley, RN; patient stable at time of transfer; sitter at bedside

## 2013-01-09 DIAGNOSIS — F319 Bipolar disorder, unspecified: Secondary | ICD-10-CM

## 2013-01-09 DIAGNOSIS — R404 Transient alteration of awareness: Secondary | ICD-10-CM

## 2013-01-09 LAB — COMPREHENSIVE METABOLIC PANEL
BUN: 12 mg/dL (ref 6–23)
CO2: 28 mEq/L (ref 19–32)
Calcium: 8.6 mg/dL (ref 8.4–10.5)
Chloride: 97 mEq/L (ref 96–112)
Creatinine, Ser: 0.81 mg/dL (ref 0.50–1.10)
GFR calc Af Amer: >90 mL/min (ref >90)
GFR calc non Af Amer: 85 mL/min — ABNORMAL LOW (ref >90)
Glucose, Bld: 100 mg/dL — ABNORMAL HIGH (ref 70–99)
Total Bilirubin: 3.3 mg/dL — ABNORMAL HIGH (ref 0.3–1.2)

## 2013-01-09 LAB — URINALYSIS, ROUTINE W REFLEX MICROSCOPIC
Hgb urine dipstick: NEGATIVE
Nitrite: POSITIVE — AB
Protein, ur: NEGATIVE mg/dL
Specific Gravity, Urine: 1.013 (ref 1.005–1.030)
Urobilinogen, UA: 4 mg/dL — ABNORMAL HIGH (ref 0.0–1.0)

## 2013-01-09 LAB — URINE MICROSCOPIC-ADD ON

## 2013-01-09 LAB — CBC
Hemoglobin: 13.1 g/dL (ref 12.0–15.0)
MCH: 33.9 pg (ref 26.0–34.0)
Platelets: 43 10*3/uL — ABNORMAL LOW (ref 150–400)
RBC: 3.87 MIL/uL (ref 3.87–5.11)
WBC: 2 10*3/uL — ABNORMAL LOW (ref 4.0–10.5)

## 2013-01-09 LAB — AMMONIA: Ammonia: 26 umol/L (ref 11–60)

## 2013-01-09 LAB — MAGNESIUM: Magnesium: 1.5 mg/dL (ref 1.5–2.5)

## 2013-01-09 MED ORDER — LORAZEPAM 2 MG/ML IJ SOLN
0.5000 mg | Freq: Once | INTRAMUSCULAR | Status: AC
  Administered 2013-01-09: 0.5 mg via INTRAVENOUS
  Filled 2013-01-09: qty 1

## 2013-01-09 MED ORDER — SODIUM CHLORIDE 0.9 % IV BOLUS (SEPSIS)
500.0000 mL | Freq: Once | INTRAVENOUS | Status: AC
  Administered 2013-01-09: 500 mL via INTRAVENOUS

## 2013-01-09 MED ORDER — MAGNESIUM OXIDE 400 (241.3 MG) MG PO TABS
400.0000 mg | ORAL_TABLET | Freq: Two times a day (BID) | ORAL | Status: DC
  Administered 2013-01-09 – 2013-01-12 (×7): 400 mg via ORAL
  Filled 2013-01-09 (×9): qty 1

## 2013-01-09 NOTE — Consult Note (Signed)
Reason for Consult:drug abuse, disorganized Referring Physician: unknown  Erin [name] is an 47 y.o. female.  HPI: Erin [name] is an 47 y.o. female with hx of alcohol dep, cannbis abuse, cocaine abuse, alcoholic cirrhosis, hypothyroidism, Bipolar disorder, schizophrenia, and sarcoidosis, presents to the ER as she was having increased tremors, nervousness, palpitation and symptoms of withdrawal. She also admitted to having increase sputum productive coughs, having subjective fever and chills. Evaluation in the ER showed tachycardia, tremors, and clinical presentation suspicious for alcohol withdrawal. She has a WBC of 1.4K, and platelet of 39K with normal hemoglobin. Her liver fx tests are elevated and her alcohol level is negative. Her CXR showed stable lymphadenopathy with no infiltrate. Hospitalist was asked to admit her for active alcohol withdrawal and upper respiratory infection.    Interval Hx:  Seen today and chart was reviewed. More calmer now. Reports more depressive symptoms now. Lost some family members recently. Reports more AVH now. Lives alone. Thinks holidays made her more depressed. Not sure why she has AMS now. ETOH was few days ago but not sure.    Past Medical History  Diagnosis Date  . Hypothyroidism   . Blood transfusion July 2012  . Depression   . Anxiety   . Bipolar 1 disorder   . Menorrhagia   . Liver failure   . Bronchitis   . Cirrhosis of liver     Past Surgical History  Procedure Date  . Tubal ligation   . Laparoscopy   . Orthopedic surgery   . Cholecystectomy   . Fracture surgery   . Hernia repair   . Vaginal hysterectomy 07/27/2011    Procedure: HYSTERECTOMY VAGINAL;  Surgeon: Scheryl Darter, MD;  Location: [facility];  Service: Gynecology;  Laterality: N/A;  Vaginal Hysterectomy with Right Oophorectomy  . Laparotomy 07/28/2011    Procedure: EXPLORATORY LAPAROTOMY;  Surgeon: Tilda Burrow, MD;  Location: [facility];  Service: Gynecology;  Laterality:  N/A;    Family History  Problem Relation Age of Onset  . Diabetes Mother   . Hypertension Mother   . Diabetes Daughter   . Hypertension Daughter     Social History:  reports that she has been smoking.  She does not have any smokeless tobacco history on file. She reports that she drinks alcohol. She reports that she does not use illicit drugs.  Allergies:  Allergies  Allergen Reactions  . Fluoxetine Hcl Other (See Comments)     hyperactivity  . Metoclopramide Hcl Other (See Comments)    depression  . Morphine And Related Other (See Comments)    High doses cause hallucinations    Medications: I have reviewed the patient's current medications.  Results for orders placed during the hospital encounter of 01/04/13 (from the past 48 hour(s))  CBC     Status: Abnormal   Collection Time   01/08/13  5:15 AM      Component Value Range Comment   WBC 3.5 (*) 4.0 - 10.5 K/uL    RBC 4.06  3.87 - 5.11 MIL/uL    Hemoglobin 13.9  12.0 - 15.0 g/dL    HCT 16.1  09.6 - 04.5 %    MCV 98.3  78.0 - 100.0 fL    MCH 34.2 (*) 26.0 - 34.0 pg    MCHC 34.8  30.0 - 36.0 g/dL    RDW 40.9  81.1 - 91.4 %    Platelets 42 (*) 150 - 400 K/uL   COMPREHENSIVE METABOLIC PANEL  Status: Abnormal   Collection Time   01/08/13  5:15 AM      Component Value Range Comment   Sodium 128 (*) 135 - 145 mEq/L    Potassium 3.4 (*) 3.5 - 5.1 mEq/L    Chloride 91 (*) 96 - 112 mEq/L    CO2 26  19 - 32 mEq/L    Glucose, Bld 110 (*) 70 - 99 mg/dL    BUN 12  6 - 23 mg/dL    Creatinine, Ser 1.61  0.50 - 1.10 mg/dL    Calcium 8.8  8.4 - 09.6 mg/dL    Total Protein 6.7  6.0 - 8.3 g/dL    Albumin 3.2 (*) 3.5 - 5.2 g/dL    AST 76 (*) 0 - 37 U/L    ALT 32  0 - 35 U/L    Alkaline Phosphatase 130 (*) 39 - 117 U/L    Total Bilirubin 3.2 (*) 0.3 - 1.2 mg/dL    GFR calc non Af Amer 79 (*) >90 mL/min    GFR calc Af Amer >90  >90 mL/min   MRSA PCR SCREENING     Status: Normal   Collection Time   01/08/13  5:54 PM       Component Value Range Comment   MRSA by PCR NEGATIVE  NEGATIVE   COMPREHENSIVE METABOLIC PANEL     Status: Abnormal   Collection Time   01/09/13  3:35 AM      Component Value Range Comment   Sodium 133 (*) 135 - 145 mEq/L    Potassium 3.6  3.5 - 5.1 mEq/L    Chloride 97  96 - 112 mEq/L    CO2 28  19 - 32 mEq/L    Glucose, Bld 100 (*) 70 - 99 mg/dL    BUN 12  6 - 23 mg/dL    Creatinine, Ser 0.45  0.50 - 1.10 mg/dL    Calcium 8.6  8.4 - 40.9 mg/dL    Total Protein 6.3  6.0 - 8.3 g/dL    Albumin 2.9 (*) 3.5 - 5.2 g/dL    AST 70 (*) 0 - 37 U/L    ALT 30  0 - 35 U/L    Alkaline Phosphatase 117  39 - 117 U/L    Total Bilirubin 3.3 (*) 0.3 - 1.2 mg/dL    GFR calc non Af Amer 85 (*) >90 mL/min    GFR calc Af Amer >90  >90 mL/min   CBC     Status: Abnormal   Collection Time   01/09/13  3:35 AM      Component Value Range Comment   WBC 2.0 (*) 4.0 - 10.5 K/uL    RBC 3.87  3.87 - 5.11 MIL/uL    Hemoglobin 13.1  12.0 - 15.0 g/dL    HCT 81.1  91.4 - 78.2 %    MCV 100.0  78.0 - 100.0 fL    MCH 33.9  26.0 - 34.0 pg    MCHC 33.9  30.0 - 36.0 g/dL    RDW 95.6  21.3 - 08.6 %    Platelets 43 (*) 150 - 400 K/uL CONSISTENT WITH PREVIOUS RESULT  AMMONIA     Status: Normal   Collection Time   01/09/13  3:35 AM      Component Value Range Comment   Ammonia 26  11 - 60 umol/L   MAGNESIUM     Status: Normal   Collection Time   01/09/13  3:35 AM  Component Value Range Comment   Magnesium 1.5  1.5 - 2.5 mg/dL     No results found.  Review of Systems  Gastrointestinal: Positive for blood in stool.   Blood pressure 88/53, pulse 82, temperature 98 F (36.7 C), temperature source Oral, resp. rate 15, height 5\' 7"  (1.702 m), weight 113.8 kg (250 lb 14.1 oz), SpO2 97.00%. Physical Exam   MSE:   Appearance:  disheveled and overweight  Behavior:  confused Speech:  Fast at times  Mood:  anxious  Affect: labile Thought Process: diorganized Thought Content:  No si  Sensorium:  Person  only  Cognition:  poor Insight:  limited  NO SI, NO HI, NO AVH    Assessment/   Axis I: delirium nos (resolving), hx of  alcohol dep, cannbis abuse, cocaine abuse  Axis II: deferred  Axis III: see med hx  Axis IV: unknown Axis V: 35  Plan:   1. Pt is getting close to her baseline. 2. Will recommend to taper off from Benzo grradually 3. Increase Seroquel to 300 mg BID .   4. Will follow    Wonda Cerise 01/09/2013, 2:27 PM

## 2013-01-09 NOTE — Progress Notes (Signed)
PULMONARY  / CRITICAL CARE MEDICINE  Name: BHAVIKA SCHNIDER MRN: [id number] DOB: 04-Apr-1966    LOS: 5  REFERRING MD :  Hongalgi  CHIEF COMPLAINT:  Delirium, psychosis  BRIEF PATIENT DESCRIPTION: 47 yo woman, hx sarcoidosis, schizophrenia, bipolar, EtOH abuse, cirrhosis. Admitted 1/10 for EtOH detox and withdrawal, URI symptoms. Progressive delirium and hallucinations, started on precedex 1/11. CCM consulted 1/12.   LINES / TUBES:  CULTURES: Blood x 2 1/10 >>  MRSA screen 1/10 >> negative  ANTIBIOTICS: levaquin 1/10 >>   SIGNIFICANT EVENTS:  1/11 precedex started 1/14 found wandering in parking lot, pt now w no recollection of these events  LEVEL OF CARE:  ICU PRIMARY SERVICE:  [facility]  CONSULTANTS:  PCCM CODE STATUS: Full DIET:  clears DVT Px:   GI Px:    INTERVAL HISTORY:  Delirium vs psychosis 1/14 >> found outside in parking lot Agitation less evident than hallucinations, confabulation Moved back to ICU for safety and ? EtOH withdrawal; precedex was not started  VITAL SIGNS: Temp:  [97.8 F (36.6 C)-99.1 F (37.3 C)] 99.1 F (37.3 C) (01/15 0800) Pulse Rate:  [77-104] 89  (01/15 1000) Resp:  [12-21] 18  (01/15 1000) BP: (80-128)/(42-92) 105/54 mmHg (01/15 1000) SpO2:  [93 %-97 %] 95 % (01/15 1000) Weight:  [111.8 kg (246 lb 7.6 oz)-113.8 kg (250 lb 14.1 oz)] 113.8 kg (250 lb 14.1 oz) (01/15 0400) HEMODYNAMICS:   VENTILATOR SETTINGS:   INTAKE / OUTPUT: Intake/Output      01/14 0701 - 01/15 0700 01/15 0701 - 01/16 0700   P.O. 240    I.V. (mL/kg) 10 (0.1)    Other 120    Total Intake(mL/kg) 370 (3.3)    Urine (mL/kg/hr) 700 (0.3) 600   Total Output 700 600   Net -330 -600        Urine Occurrence 2 x      PHYSICAL EXAMINATION: General:  Obese, NAD Neuro:  Sleep but oriented, follows commands, moves all ext; no active hallucinations HEENT:  Op clear, no stridor but very hoarse voice Cardiovascular:  regular, no M Lungs:  Clear, decreased at  bases Abdomen:  Obese, NT, + BS Musculoskeletal:  No deformities Skin:  No rash  LABS: Cbc  Lab 01/09/13 0335 01/08/13 0515 01/07/13 0350  WBC 2.0* -- --  HGB 13.1 13.9 13.4  HCT 38.7 39.9 39.4  PLT 43* 42* 32*    Chemistry   Lab 01/09/13 0335 01/08/13 0515 01/07/13 0351 01/06/13 1035 01/05/13 0330  NA 133* 128* 128* -- --  K 3.6 3.4* 4.3 -- --  CL 97 91* 93* -- --  CO2 28 26 25  -- --  BUN 12 12 5* -- --  CREATININE 0.81 0.86 0.66 -- --  CALCIUM 8.6 8.8 8.7 -- --  MG 1.5 -- -- 1.9 1.1*  PHOS -- -- -- -- --  GLUCOSE 100* 110* 120* -- --    Liver fxn  Lab 01/09/13 0335 01/08/13 0515 01/07/13 0351  AST 70* 76* 81*  ALT 30 32 33  ALKPHOS 117 130* 133*  BILITOT 3.3* 3.2* 2.4*  PROT 6.3 6.7 6.5  ALBUMIN 2.9* 3.2* 3.0*   coags No results found for this basename: APTT:3,INR:3 in the last 168 hours Sepsis markers  Lab 01/04/13 1504  LATICACIDVEN 1.8  PROCALCITON --   Cardiac markers No results found for this basename: CKTOTAL:3,CKMB:3,TROPONINI:3 in the last 168 hours BNP No results found for this basename: PROBNP:3 in the last 168 hours ABG No  results found for this basename: PHART:3,PCO2ART:3,PO2ART:3,HCO3:3,TCO2:3 in the last 168 hours  CBG trend No results found for this basename: GLUCAP:5 in the last 168 hours  IMAGING: No results found.  ECG:  DIAGNOSES: Active Problems:  Sarcoidosis  HYPOTHYROIDISM  TOBACCO DEPENDENCE  CIRRHOSIS, ALCOHOLIC, LIVER  HX, PERSONAL, SCHIZOPHRENIA  Bipolar 1 disorder  Acute URI  Hypokalemia  Hoarseness with URI symptoms  Alcohol withdrawal hallucinosis  Leukopenia  Thrombocytopenia  Delirium, acute   ASSESSMENT / PLAN:  PULMONARY  ASSESSMENT: Sarcoidosis with hx LAD and mild (stable) nodular disease, hx nasal and pharyngeal polyps PLAN:   - she has hoarse voice and ? Nasal involvement but I do not want to try to treat w steroids in fragile patient already dealing with another reason to have delirium. May  need to revisit prednisone in the future, either at the end of this hospitalization or as an outpatient.   CARDIOVASCULAR  ASSESSMENT:  HTN PLAN:  - home lasix  RENAL  ASSESSMENT:   Hyponatremia  PLAN:   - follow BMP  GASTROINTESTINAL  ASSESSMENT:   Cirrhosis, due to EtOH, LFT improving PLAN:   - follow LFT and INR  HEMATOLOGIC  ASSESSMENT:   Thrombocytopenia Leukopenia, likely due to EtOH history PLAN:   INFECTIOUS  ASSESSMENT:   URI, ? bronchitis PLAN:   - started on levaquin by primary service, has been on since 1/10 >> can probably STOP this  ENDOCRINE  ASSESSMENT:  hyperglycemia   PLAN:   - follow glucose, consider CBG's if remain elevated  NEUROLOGIC  ASSESSMENT:   EtOH withdrawal > this was presumed admission dx and may have been the case, but now her findings are less consistent. More consistent with her underlying psychiatric disease.  Schizophrenia, bipolar disease PLAN:   - did not require precedex overnight 1/14; I do not believe she is actively withdrawing. She does use daily benzos, binge drinks EtOH.  - do not use precedex in this setting - would attempt to wean ativan slowly as she has established benzo use every day - folate, thiamine - critical issue now appears to be getting formal psych eval and sorting out psych regimen (clearly EtOh and benzo use aren't helping her psych issues at baseline). ? Whether she can be managed outpatient or whether she needs inpatient care at [facility]. I suspect she will need inpatient care if she will agree.   GLOBAL: - recommend change to SDU status - do not use precedex as do not believe EtOH is driving her current sx - psych input to adjust meds if appropriate; suspect she needs inpatient psych care - haldol prn - avoiding prednisone to to avoid contribution to delirium - PCCM will sign off. Please call if we can help you  I have personally obtained a history, examined the patient, evaluated laboratory and  imaging results, formulated the assessment and plan and placed orders.   Levy Pupa, MD, PhD 01/09/2013, 10:38 AM [facility] [phone] or if no answer [phone]
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
## 2013-01-10 ENCOUNTER — Inpatient Hospital Stay (HOSPITAL_COMMUNITY): Payer: Medicare Other

## 2013-01-10 ENCOUNTER — Inpatient Hospital Stay (HOSPITAL_COMMUNITY)
Admit: 2013-01-10 | Discharge: 2013-01-10 | Disposition: A | Discharge status: Home / Self Care | Payer: Medicare Other | Attending: Internal Medicine | Admitting: Internal Medicine

## 2013-01-10 DIAGNOSIS — F3289 Other specified depressive episodes: Secondary | ICD-10-CM

## 2013-01-10 DIAGNOSIS — N390 Urinary tract infection, site not specified: Secondary | ICD-10-CM

## 2013-01-10 DIAGNOSIS — F319 Bipolar disorder, unspecified: Secondary | ICD-10-CM

## 2013-01-10 DIAGNOSIS — F329 Major depressive disorder, single episode, unspecified: Secondary | ICD-10-CM

## 2013-01-10 LAB — BASIC METABOLIC PANEL
Calcium: 8.6 mg/dL (ref 8.4–10.5)
Chloride: 96 mEq/L (ref 96–112)
Creatinine, Ser: 0.69 mg/dL (ref 0.50–1.10)
GFR calc Af Amer: >90 mL/min (ref >90)
Sodium: 131 mEq/L — ABNORMAL LOW (ref 135–145)

## 2013-01-10 LAB — CBC
MCH: 34 pg (ref 26.0–34.0)
MCV: 100.8 fL — ABNORMAL HIGH (ref 78.0–100.0)
Platelets: 39 10*3/uL — ABNORMAL LOW (ref 150–400)
RDW: 13.8 % (ref 11.5–15.5)
WBC: 1.5 10*3/uL — ABNORMAL LOW (ref 4.0–10.5)

## 2013-01-10 LAB — URINE CULTURE

## 2013-01-10 MED ORDER — GADOBENATE DIMEGLUMINE 529 MG/ML IV SOLN
20.0000 mL | Freq: Once | INTRAVENOUS | Status: AC | PRN
  Administered 2013-01-10: 20 mL via INTRAVENOUS

## 2013-01-10 MED ORDER — DEXTROSE 5 % IV SOLN
1.0000 g | INTRAVENOUS | Status: DC
  Administered 2013-01-10 – 2013-01-11 (×2): 1 g via INTRAVENOUS
  Filled 2013-01-10 (×3): qty 10

## 2013-01-10 NOTE — Progress Notes (Signed)
[facility] PROGRESS NOTE  SHELTON SOLER WUJ:[id number] DOB: 07-Dec-1966 DOA: 01/04/2013  PCP: Patient states she doesn't have a PCP  Brief HPI: Erin [name] is an 47 y.o. female with hx of episodic alcohol abuse, alcoholic cirrhosis, hypothyroidism, Bipolar disorder, schizophrenia, and sarcoidosis, presented to the ED on 1/10 as she was having increased tremors, nervousness, palpitation and symptoms of withdrawal. She also admitted to having increase sputum productive coughs, having subjective fever and chills. Evaluation in the ER showed tachycardia, tremors, and clinical presentation suspicious for alcohol withdrawal. She has a WBC of 1.4K, and platelet of 39K with normal hemoglobin. Her liver fx tests are elevated and her alcohol level is negative. Her CXR showed stable lymphadenopathy with no infiltrate. Hospitalist was asked to admit her for active alcohol withdrawal and upper respiratory infection. Patient was initially admitted to ICU and started on Ativan protocol. Overnight on 1/11, withdrawal worsened and patient was started on Precedex infusion by critical care medicine. She then improved. Precedex drip was stopped early 1/13 am. She was transferred to telemetry on 1/13. On 1/14 morning, she progressively worsened-increased confusion, worsening hallucinations-seen her demised family members and other objects, pacing in the room and floor, wandered off the hospital and had to be brought back by security. She was then moved back to ICU.  Past medical history:  Past Medical History  Diagnosis Date  . Hypothyroidism   . Blood transfusion July 2012  . Depression   . Anxiety   . Bipolar 1 disorder   . Menorrhagia   . Liver failure   . Bronchitis   . Cirrhosis of liver     Consultants: Psyche, CCM  Procedures: None  Antibiotics: Levaquin from admission till 1/15  Subjective: Patient more lucid today. She doesn't recall events from 2 days ago. Recognized her friend. She  stated she gets vivid hallucinations. Mentions some history of 'seizure' in the past. Patient denies any hallucinations currently.  Objective: Vital Signs  Filed Vitals:   01/10/13 0200 01/10/13 0328 01/10/13 0400 01/10/13 0600  BP: 80/45 102/57  82/50  Pulse: 89 83  79  Temp:   99.1 F (37.3 C)   TempSrc:   Oral   Resp: 13 16  12   Height:      Weight:      SpO2: 95% 95%  96%    Intake/Output Summary (Last 24 hours) at 01/10/13 0757 Last data filed at 01/10/13 0600  Gross per 24 hour  Intake    130 ml  Output   1400 ml  Net  -1270 ml   Filed Weights   01/07/13 1345 01/08/13 1750 01/09/13 0400  Weight: 111.585 kg (246 lb) 111.8 kg (246 lb 7.6 oz) 113.8 kg (250 lb 14.1 oz)    General appearance: alert, cooperative, appears stated age, no distress and moderately obese Head: Normocephalic, without obvious abnormality, atraumatic Eyes: conjunctivae/corneas clear. PERRL, EOM's intact. Resp: clear to auscultation bilaterally Cardio: regular rate and rhythm, S1, S2 normal, no murmur, click, rub or gallop GI: soft, non-tender; bowel sounds normal; no masses,  no organomegaly Extremities: extremities normal, atraumatic, no cyanosis or edema Pulses: 2+ and symmetric Neurologic: Alert and oriented x 3. No focal deficits. More lucid today.  Lab Results:  Basic Metabolic Panel:  Lab 01/10/13 9562 01/09/13 0335 01/08/13 0515 01/07/13 0351 01/06/13 1036 01/06/13 1035 01/05/13 0330  NA 131* 133* 128* 128* 130* -- --  K 4.0 3.6 3.4* 4.3 4.6 -- --  CL 96 97 91* 93* 97 -- --  CO2 27 28 26 25 23  -- --  GLUCOSE 109* 100* 110* 120* 136* -- --  BUN 12 12 12  5* 4* -- --  CREATININE 0.69 0.81 0.86 0.66 0.71 -- --  CALCIUM 8.6 8.6 8.8 8.7 8.1* -- --  MG -- 1.5 -- -- -- 1.9 1.1*  PHOS -- -- -- -- -- -- --   Liver Function Tests:  Lab 01/09/13 0335 01/08/13 0515 01/07/13 0351 01/06/13 1036 01/05/13 0330  AST 70* 76* 81* 86* 124*  ALT 30 32 33 34 41*  ALKPHOS 117 130* 133* 140* 148*    BILITOT 3.3* 3.2* 2.4* 2.6* 2.9*  PROT 6.3 6.7 6.5 6.5 6.1  ALBUMIN 2.9* 3.2* 3.0* 2.9* 3.1*    Lab 01/05/13 0330  LIPASE 42  AMYLASE --    Lab 01/09/13 0335 01/04/13 1423  AMMONIA 26 28   CBC:  Lab 01/10/13 0330 01/09/13 0335 01/08/13 0515 01/07/13 0350 01/06/13 1035 01/04/13 1417  WBC 1.5* 2.0* 3.5* 2.1* 2.2* --  NEUTROABS -- -- -- 1.3* -- 1.0*  HGB 12.7 13.1 13.9 13.4 12.9 --  HCT 37.7 38.7 39.9 39.4 38.7 --  MCV 100.8* 100.0 98.3 99.0 100.8* --  PLT 39* 43* 42* 32* 37* --    Recent Results (from the past 240 hour(s))  CULTURE, BLOOD (ROUTINE X 2)     Status: Normal (Preliminary result)   Collection Time   01/04/13  3:04 PM      Component Value Range Status Comment   Specimen Description BLOOD   Final    Special Requests BOTTLES DRAWN AEROBIC AND ANAEROBIC 10CC   Final    Culture  Setup Time 01/05/2013 01:15   Final    Culture     Final    Value:        BLOOD CULTURE RECEIVED NO GROWTH TO DATE CULTURE WILL BE HELD FOR 5 DAYS BEFORE ISSUING A FINAL NEGATIVE REPORT   Report Status PENDING   Incomplete   CULTURE, BLOOD (ROUTINE X 2)     Status: Normal (Preliminary result)   Collection Time   01/04/13  3:35 PM      Component Value Range Status Comment   Specimen Description BLOOD LEFT ARM   Final    Special Requests BOTTLES DRAWN AEROBIC AND ANAEROBIC 5CC   Final    Culture  Setup Time 01/05/2013 03:14   Final    Culture     Final    Value:        BLOOD CULTURE RECEIVED NO GROWTH TO DATE CULTURE WILL BE HELD FOR 5 DAYS BEFORE ISSUING A FINAL NEGATIVE REPORT   Report Status PENDING   Incomplete   MRSA PCR SCREENING     Status: Normal   Collection Time   01/05/13 12:33 AM      Component Value Range Status Comment   MRSA by PCR NEGATIVE  NEGATIVE Final   MRSA PCR SCREENING     Status: Normal   Collection Time   01/08/13  5:54 PM      Component Value Range Status Comment   MRSA by PCR NEGATIVE  NEGATIVE Final       Studies/Results: No results found.  Medications:   Scheduled:    . antiseptic oral rinse  15 mL Mouth Rinse q12n4p  . buPROPion  150 mg Oral Daily  . chlorhexidine  15 mL Mouth Rinse BID  . docusate sodium  100 mg Oral BID  . DULoxetine  60 mg Oral BID  .  folic acid  1 mg Oral Daily  . furosemide  20 mg Oral Daily  . gabapentin  800 mg Oral TID  . LORazepam  1 mg Intravenous Q8H  . magnesium oxide  400 mg Oral BID  . multivitamin with minerals  1 tablet Oral Daily  . QUEtiapine  200 mg Oral BID  . sodium chloride  3 mL Intravenous Q12H  . thiamine  100 mg Oral Daily   Continuous:  OZH:[id number], albuterol, albuterol, haloperidol lactate, menthol-cetylpyridinium, ondansetron (ZOFRAN) IV, ondansetron, traMADol  Assessment/Plan:  Active Problems:  Sarcoidosis  HYPOTHYROIDISM  TOBACCO DEPENDENCE  CIRRHOSIS, ALCOHOLIC, LIVER  HX, PERSONAL, SCHIZOPHRENIA  Bipolar 1 disorder  Acute URI  Hypokalemia  Hoarseness with URI symptoms  Alcohol withdrawal hallucinosis  Leukopenia  Thrombocytopenia  Delirium, acute    Acute psychosis/Delirium/alcohol withdrawal DTs  Appears to be closer to baseline. No obvious signs of alcohol withdrawal. It appears her current symptoms are secondary to psychiatric disease process. Appreciate Psyche input. Continue Seroquel and Haldol. Close watch on Qt interval (so far, not prolonged). Apparently there is history of hallucinations even at baseline. 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 Will keep on low dose IV Ativan for alcohol withdrawal/?? Benzodiazepine withdrawal. Continue Thiamine. Due to history of 'seizure' in the past we will get MRI brain and EEG to complete work up and rule out organic reason for her presentation.  UTI/Abnormal UA She completed a course of Levaquin recently. UA is abnormal suggestive of UTI. Will initiate ceftriaxone and await urine cultures.  History of Alcoholic cirrhosis  This appears to be stable. Abdominal ultrasound  negative for ascites. Lipase normal.   Borderline Low BP Stable. Asymptomatic. Probably related to cirrhosis/meds. Continue to monitor.  Leukopenia, thrombocytopenia and macrocytosis  All chronic based on old labs. Likely secondary to alcohol abuse/cirrhosis. No overt bleeding.    Hypokalemia and hypomagnesemia  Secondary to alcohol abuse. Replete and follow as needed. Start Mag Ox.  Hyponatremia  Likely from cirrhosis. Stable. Follow daily BMP   Tobacco abuse  Cessation counseled.   Bipolar 1 disorder/? Schizophrenia per son  Please see above.   Hoarseness with acute bronchitis  She states that she had vocal cord surgery 5 years ago. ? Viral etiology. Seems to have improved. ST consulted and did not see any swallowing issues.   History of Sarcoidosis with history of mild stable nodular disease.  Outpatient followup   DVT Prophylaxis SCD's   Code Status: Full  Family communication: Discussed with patient at bedside.  Disposition: Transfer to floor with sitter for now.    LOS: 6 days   [facility]  [facility] Pager [phone] 01/10/2013, 7:57 AM  If 8PM-8AM, please contact night-coverage at [email or web address], password [facility]

## 2013-01-10 NOTE — Progress Notes (Signed)
Met with Pt today to discuss d/c plans.  Pt lucid, calm and cooperative.  Pt doesn't remember events from the past couple of days.  Pt stated that she lives at [facility] and that she really likes it there.  She pays rent and they provide her with meals 5 days a week.    Pt stated that she receives [facility] services and can use these services for outpt tx.  Pt stated that she is not followed on an outpt basis for her BiPolar; Pt now understands that she needs medication mgmt for Bipolar.  Pt agreeable to outpt tx with [facility].  CSW gave Pt information on [facility].  Pt may needs assistance with transportation home upon d/c.  CSW thanked Pt for her time.  Providence Crosby, LCSWA Clinical Social Work [phone]

## 2013-01-10 NOTE — Progress Notes (Signed)
Offsite portable EEG completed.

## 2013-01-10 NOTE — Progress Notes (Signed)
Patient received as transfer from ICU.  Patient is awake alert and oriented x3.  Patient sitter at bedside.  No complaints or questions at this time.  SCD's on.  Belongings put away.  Patient resting comfortably.  Will continue to monitor.

## 2013-01-10 NOTE — Consult Note (Signed)
Reason for Consult:drug abuse, disorganized Referring Physician: unknown  Erin [name] is an 47 y.o. female.  HPI: Erin [name] is an 47 y.o. female with hx of alcohol dep, cannbis abuse, cocaine abuse, alcoholic cirrhosis, hypothyroidism, Bipolar disorder, schizophrenia, and sarcoidosis, presents to the ER as she was having increased tremors, nervousness, palpitation and symptoms of withdrawal. She also admitted to having increase sputum productive coughs, having subjective fever and chills. Evaluation in the ER showed tachycardia, tremors, and clinical presentation suspicious for alcohol withdrawal. She has a WBC of 1.4K, and platelet of 39K with normal hemoglobin. Her liver fx tests are elevated and her alcohol level is negative. Her CXR showed stable lymphadenopathy with no infiltrate. Hospitalist was asked to admit her for active alcohol withdrawal and upper respiratory infection.    Interval Hx:  Seen today and chart was reviewed. Continues to reports more AVH.  More calmer now. Reports more depressive symptoms now but there is some improvement.   Past Medical History  Diagnosis Date  . Hypothyroidism   . Blood transfusion July 2012  . Depression   . Anxiety   . Bipolar 1 disorder   . Menorrhagia   . Liver failure   . Bronchitis   . Cirrhosis of liver     Past Surgical History  Procedure Date  . Tubal ligation   . Laparoscopy   . Orthopedic surgery   . Cholecystectomy   . Fracture surgery   . Hernia repair   . Vaginal hysterectomy 07/27/2011    Procedure: HYSTERECTOMY VAGINAL;  Surgeon: Scheryl Darter, [name];  Location: [facility];  Service: Gynecology;  Laterality: N/A;  Vaginal Hysterectomy with Right Oophorectomy  . Laparotomy 07/28/2011    Procedure: EXPLORATORY LAPAROTOMY;  Surgeon: Erin [name];  Location: [facility];  Service: Gynecology;  Laterality: N/A;    Family History  Problem Relation Age of Onset  . Diabetes Mother   . Hypertension Mother   . Diabetes  Daughter   . Hypertension Daughter     Social History:  reports that she has been smoking.  She does not have any smokeless tobacco history on file. She reports that she drinks alcohol. She reports that she does not use illicit drugs.  Allergies:  Allergies  Allergen Reactions  . Fluoxetine Hcl Other (See Comments)     hyperactivity  . Metoclopramide Hcl Other (See Comments)    depression  . Morphine And Related Other (See Comments)    High doses cause hallucinations    Medications: I have reviewed the patient's current medications.  Results for orders placed during the hospital encounter of 01/04/13 (from the past 48 hour(s))  MRSA PCR SCREENING     Status: Normal   Collection Time   01/08/13  5:54 PM      Component Value Range Comment   MRSA by PCR NEGATIVE  NEGATIVE   COMPREHENSIVE METABOLIC PANEL     Status: Abnormal   Collection Time   01/09/13  3:35 AM      Component Value Range Comment   Sodium 133 (*) 135 - 145 mEq/L    Potassium 3.6  3.5 - 5.1 mEq/L    Chloride 97  96 - 112 mEq/L    CO2 28  19 - 32 mEq/L    Glucose, Bld 100 (*) 70 - 99 mg/dL    BUN 12  6 - 23 mg/dL    Creatinine, Ser 1.61  0.50 - 1.10 mg/dL    Calcium 8.6  8.4 -  10.5 mg/dL    Total Protein 6.3  6.0 - 8.3 g/dL    Albumin 2.9 (*) 3.5 - 5.2 g/dL    AST 70 (*) 0 - 37 U/L    ALT 30  0 - 35 U/L    Alkaline Phosphatase 117  39 - 117 U/L    Total Bilirubin 3.3 (*) 0.3 - 1.2 mg/dL    GFR calc non Af Amer 85 (*) >90 mL/min    GFR calc Af Amer >90  >90 mL/min   CBC     Status: Abnormal   Collection Time   01/09/13  3:35 AM      Component Value Range Comment   WBC 2.0 (*) 4.0 - 10.5 K/uL    RBC 3.87  3.87 - 5.11 MIL/uL    Hemoglobin 13.1  12.0 - 15.0 g/dL    HCT 84.1  32.4 - 40.1 %    MCV 100.0  78.0 - 100.0 fL    MCH 33.9  26.0 - 34.0 pg    MCHC 33.9  30.0 - 36.0 g/dL    RDW 02.7  25.3 - 66.4 %    Platelets 43 (*) 150 - 400 K/uL CONSISTENT WITH PREVIOUS RESULT  AMMONIA     Status: Normal    Collection Time   01/09/13  3:35 AM      Component Value Range Comment   Ammonia 26  11 - 60 umol/L   MAGNESIUM     Status: Normal   Collection Time   01/09/13  3:35 AM      Component Value Range Comment   Magnesium 1.5  1.5 - 2.5 mg/dL   URINALYSIS, ROUTINE W REFLEX MICROSCOPIC     Status: Abnormal   Collection Time   01/09/13  5:28 PM      Component Value Range Comment   Color, Urine AMBER (*) YELLOW BIOCHEMICALS MAY BE AFFECTED BY COLOR   APPearance CLOUDY (*) CLEAR    Specific Gravity, Urine 1.013  1.005 - 1.030    pH 6.5  5.0 - 8.0    Glucose, UA NEGATIVE  NEGATIVE mg/dL    Hgb urine dipstick NEGATIVE  NEGATIVE    Bilirubin Urine SMALL (*) NEGATIVE    Ketones, ur NEGATIVE  NEGATIVE mg/dL    Protein, ur NEGATIVE  NEGATIVE mg/dL    Urobilinogen, UA 4.0 (*) 0.0 - 1.0 mg/dL    Nitrite POSITIVE (*) NEGATIVE    Leukocytes, UA LARGE (*) NEGATIVE   URINE MICROSCOPIC-ADD ON     Status: Abnormal   Collection Time   01/09/13  5:28 PM      Component Value Range Comment   Squamous Epithelial / LPF FEW (*) RARE    WBC, UA 21-50  <3 WBC/hpf    Bacteria, UA FEW (*) RARE   CBC     Status: Abnormal   Collection Time   01/10/13  3:30 AM      Component Value Range Comment   WBC 1.5 (*) 4.0 - 10.5 K/uL    RBC 3.74 (*) 3.87 - 5.11 MIL/uL    Hemoglobin 12.7  12.0 - 15.0 g/dL    HCT 40.3  47.4 - 25.9 %    MCV 100.8 (*) 78.0 - 100.0 fL    MCH 34.0  26.0 - 34.0 pg    MCHC 33.7  30.0 - 36.0 g/dL    RDW 56.3  87.5 - 64.3 %    Platelets 39 (*) 150 - 400 K/uL   BASIC  METABOLIC PANEL     Status: Abnormal   Collection Time   01/10/13  3:30 AM      Component Value Range Comment   Sodium 131 (*) 135 - 145 mEq/L    Potassium 4.0  3.5 - 5.1 mEq/L    Chloride 96  96 - 112 mEq/L    CO2 27  19 - 32 mEq/L    Glucose, Bld 109 (*) 70 - 99 mg/dL    BUN 12  6 - 23 mg/dL    Creatinine, Ser 1.61  0.50 - 1.10 mg/dL    Calcium 8.6  8.4 - 09.6 mg/dL    GFR calc non Af Amer >90  >90 mL/min    GFR calc Af Amer  >90  >90 mL/min     No results found.  Review of Systems  Gastrointestinal: Positive for constipation and blood in stool.   Blood pressure 108/57, pulse 83, temperature 97.6 F (36.4 C), temperature source Oral, resp. rate 13, height 5\' 7"  (1.702 m), weight 113.8 kg (250 lb 14.1 oz), SpO2 96.00%. Physical Exam   MSE:   Appearance:   overweight  Behavior:  calm Speech: fair  Mood:  anxious  Affect: ristricted Thought Process: diorganized Thought Content: AVH at times No si  Sensorium:  Person only  Cognition:  poor Insight:  limited  NO SI, NO HI,    Assessment/   Axis I: delirium nos (resolving), hx of  alcohol dep, cannbis abuse, cocaine abuse  Axis II: deferred  Axis III: see med hx  Axis IV: unknown Axis V: 40  Plan:   1. Pt is getting close to her baseline. Pt might not need to go psy inpt if she continues to improve 2. Will recommend to taper off from Benzo grradually 3. Increase Seroquel to 300 mg BID . recommend to observe QTC as seroquel can cause it but it less potential as compared with others  4. Will follow    Wonda Cerise 01/10/2013, 11:53 AM

## 2013-01-10 NOTE — Progress Notes (Signed)
NUTRITION FOLLOW UP  Intervention:   1.  General healthful diet; continue to encourage intake as needed.  Nutrition Dx:   Inadequate oral intake, resolving.  Monitor:   PO intake, swallowing function, weight, labs  Assessment:   Patient with history of alcohol abuse, admitted with withdrawal symptoms. She reports difficulty swallowing, with food getting stuck in her throat. She has a history of vocal cord surgery. She reports decreased intake and weight loss of 2% over the last month as a result of this.   Pt was seen by SLP (1/13) and determined to be appropriate for Regular, thin liquids with tolerance. Pt states she is having some nausea which is controlled by phenergan and is eating well. PO currently 100% of meals.     Height: Ht Readings from Last 1 Encounters:  01/07/13 5\' 7"  (1.702 m)    Weight Status:   Wt Readings from Last 1 Encounters:  01/09/13 250 lb 14.1 oz (113.8 kg)    Re-estimated needs:  Kcal: 2000-2150 Protein: 115-125g Fluid: >2.0 L/day  Skin: non-pitting edema  Diet Order: General   Intake/Output Summary (Last 24 hours) at 01/10/13 1154 Last data filed at 01/10/13 0848  Gross per 24 hour  Intake    130 ml  Output   1100 ml  Net   -970 ml    Last BM: 1/13   Labs:   Lab 01/10/13 0330 01/09/13 0335 01/08/13 0515 01/06/13 1035 01/05/13 0330  NA 131* 133* 128* -- --  K 4.0 3.6 3.4* -- --  CL 96 97 91* -- --  CO2 27 28 26  -- --  BUN 12 12 12  -- --  CREATININE 0.69 0.81 0.86 -- --  CALCIUM 8.6 8.6 8.8 -- --  MG -- 1.5 -- 1.9 1.1*  PHOS -- -- -- -- --  GLUCOSE 109* 100* 110* -- --    CBG (last 3)  No results found for this basename: GLUCAP:3 in the last 72 hours  Scheduled Meds:   . antiseptic oral rinse  15 mL Mouth Rinse q12n4p  . buPROPion  150 mg Oral Daily  . cefTRIAXone (ROCEPHIN)  IV  1 g Intravenous Q24H  . chlorhexidine  15 mL Mouth Rinse BID  . docusate sodium  100 mg Oral BID  . DULoxetine  60 mg Oral BID  . folic acid   1 mg Oral Daily  . furosemide  20 mg Oral Daily  . gabapentin  800 mg Oral TID  . LORazepam  1 mg Intravenous Q8H  . magnesium oxide  400 mg Oral BID  . multivitamin with minerals  1 tablet Oral Daily  . QUEtiapine  200 mg Oral BID  . sodium chloride  3 mL Intravenous Q12H  . thiamine  100 mg Oral Daily    Continuous Infusions:   Loyce Dys, MS RD LDN Clinical Inpatient Dietitian Pager: [phone] Weekend/After hours pager: [phone]

## 2013-01-11 LAB — CBC
HCT: 37.3 % (ref 36.0–46.0)
Hemoglobin: 12.6 g/dL (ref 12.0–15.0)
RBC: 3.69 MIL/uL — ABNORMAL LOW (ref 3.87–5.11)
WBC: 1.7 10*3/uL — ABNORMAL LOW (ref 4.0–10.5)

## 2013-01-11 LAB — CULTURE, BLOOD (ROUTINE X 2): Culture: NO GROWTH

## 2013-01-11 LAB — BASIC METABOLIC PANEL
BUN: 12 mg/dL (ref 6–23)
Chloride: 99 mEq/L (ref 96–112)
Glucose, Bld: 97 mg/dL (ref 70–99)
Potassium: 3.8 mEq/L (ref 3.5–5.1)

## 2013-01-11 MED ORDER — FLEET ENEMA 7-19 GM/118ML RE ENEM
1.0000 | ENEMA | Freq: Every day | RECTAL | Status: DC | PRN
  Administered 2013-01-11: 1 via RECTAL
  Filled 2013-01-11: qty 1

## 2013-01-11 MED ORDER — LORAZEPAM 1 MG PO TABS
1.0000 mg | ORAL_TABLET | Freq: Three times a day (TID) | ORAL | Status: DC
  Administered 2013-01-11 – 2013-01-12 (×4): 1 mg via ORAL
  Filled 2013-01-11 (×4): qty 1

## 2013-01-11 MED ORDER — POLYETHYLENE GLYCOL 3350 17 G PO PACK
17.0000 g | PACK | Freq: Every day | ORAL | Status: DC
  Administered 2013-01-11 – 2013-01-12 (×2): 17 g via ORAL
  Filled 2013-01-11 (×2): qty 1

## 2013-01-11 NOTE — Progress Notes (Addendum)
[facility] PROGRESS NOTE  Erin [name] ZOX:[id number] DOB: 1966/06/06 DOA: 01/04/2013  PCP: Patient states she doesn't have a PCP  Brief HPI: Erin [name] is an 47 y.o. female with hx of episodic alcohol abuse, alcoholic cirrhosis, hypothyroidism, Bipolar disorder, schizophrenia, and sarcoidosis, presented to the ED on 1/10 as she was having increased tremors, nervousness, palpitation and symptoms of withdrawal. She also admitted to having increase sputum productive coughs, having subjective fever and chills. Evaluation in the ER showed tachycardia, tremors, and clinical presentation suspicious for alcohol withdrawal. She has a WBC of 1.4K, and platelet of 39K with normal hemoglobin. Her liver fx tests are elevated and her alcohol level is negative. Her CXR showed stable lymphadenopathy with no infiltrate. Hospitalist was asked to admit her for active alcohol withdrawal and upper respiratory infection. Patient was initially admitted to ICU and started on Ativan protocol. Overnight on 1/11, withdrawal worsened and patient was started on Precedex infusion by critical care medicine. She then improved. Precedex drip was stopped early 1/13 am. She was transferred to telemetry on 1/13. On 1/14 morning, she progressively worsened-increased confusion, worsening hallucinations-seen her demised family members and other objects, pacing in the room and floor, wandered off the hospital and had to be brought back by security. She was then moved back to ICU.  Past medical history:  Past Medical History  Diagnosis Date  . Hypothyroidism   . Blood transfusion July 2012  . Depression   . Anxiety   . Bipolar 1 disorder   . Menorrhagia   . Liver failure   . Bronchitis   . Cirrhosis of liver     Consultants: Psyche, CCM (signed off)  Procedures: None  Antibiotics: Levaquin from admission till 1/15 Ceftriaxone 1/16-->  Subjective: Patient admits to hallucinations once in a while. Denies any  complaints otherwise.   Objective: Vital Signs  Filed Vitals:   01/10/13 1400 01/10/13 1919 01/10/13 2200 01/11/13 0516  BP: 91/56 124/87 105/70 101/68  Pulse: 79 85 79 75  Temp: 97.8 F (36.6 C) 98 F (36.7 C) 98 F (36.7 C) 98.2 F (36.8 C)  TempSrc: Axillary Oral Oral Oral  Resp: 13   18  Height:      Weight:      SpO2: 97% 91% 93% 93%    Intake/Output Summary (Last 24 hours) at 01/11/13 1046 Last data filed at 01/10/13 1300  Gross per 24 hour  Intake      0 ml  Output    401 ml  Net   -401 ml   Filed Weights   01/07/13 1345 01/08/13 1750 01/09/13 0400  Weight: 111.585 kg (246 lb) 111.8 kg (246 lb 7.6 oz) 113.8 kg (250 lb 14.1 oz)    General appearance: alert, cooperative, appears stated age, no distress and moderately obese Head: Normocephalic, without obvious abnormality, atraumatic Eyes: conjunctivae/corneas clear. PERRL, EOM's intact. Resp: clear to auscultation bilaterally Cardio: regular rate and rhythm, S1, S2 normal, no murmur, click, rub or gallop GI: soft, non-tender; bowel sounds normal; no masses,  no organomegaly Extremities: extremities normal, atraumatic, no cyanosis or edema Pulses: 2+ and symmetric Neurologic: Alert and oriented x 3. No focal deficits.  Lab Results:  Basic Metabolic Panel:  Lab 01/11/13 8119 01/10/13 0330 01/09/13 0335 01/08/13 0515 01/07/13 0351 01/06/13 1035 01/05/13 0330  NA 136 131* 133* 128* 128* -- --  K 3.8 4.0 3.6 3.4* 4.3 -- --  CL 99 96 97 91* 93* -- --  CO2 29 27 28  26  25 -- --  GLUCOSE 97 109* 100* 110* 120* -- --  BUN 12 12 12 12  5* -- --  CREATININE 0.72 0.69 0.81 0.86 0.66 -- --  CALCIUM 9.1 8.6 8.6 8.8 8.7 -- --  MG -- -- 1.5 -- -- 1.9 1.1*  PHOS -- -- -- -- -- -- --   Liver Function Tests:  Lab 01/09/13 0335 01/08/13 0515 01/07/13 0351 01/06/13 1036 01/05/13 0330  AST 70* 76* 81* 86* 124*  ALT 30 32 33 34 41*  ALKPHOS 117 130* 133* 140* 148*  BILITOT 3.3* 3.2* 2.4* 2.6* 2.9*  PROT 6.3 6.7 6.5 6.5  6.1  ALBUMIN 2.9* 3.2* 3.0* 2.9* 3.1*    Lab 01/05/13 0330  LIPASE 42  AMYLASE --    Lab 01/09/13 0335 01/04/13 1423  AMMONIA 26 28   CBC:  Lab 01/11/13 0507 01/10/13 0330 01/09/13 0335 01/08/13 0515 01/07/13 0350 01/04/13 1417  WBC 1.7* 1.5* 2.0* 3.5* 2.1* --  NEUTROABS -- -- -- -- 1.3* 1.0*  HGB 12.6 12.7 13.1 13.9 13.4 --  HCT 37.3 37.7 38.7 39.9 39.4 --  MCV 101.1* 100.8* 100.0 98.3 99.0 --  PLT 65* 39* 43* 42* 32* --    Recent Results (from the past 240 hour(s))  CULTURE, BLOOD (ROUTINE X 2)     Status: Normal (Preliminary result)   Collection Time   01/04/13  3:04 PM      Component Value Range Status Comment   Specimen Description BLOOD   Final    Special Requests BOTTLES DRAWN AEROBIC AND ANAEROBIC 10CC   Final    Culture  Setup Time 01/05/2013 01:15   Final    Culture     Final    Value:        BLOOD CULTURE RECEIVED NO GROWTH TO DATE CULTURE WILL BE HELD FOR 5 DAYS BEFORE ISSUING A FINAL NEGATIVE REPORT   Report Status PENDING   Incomplete   CULTURE, BLOOD (ROUTINE X 2)     Status: Normal (Preliminary result)   Collection Time   01/04/13  3:35 PM      Component Value Range Status Comment   Specimen Description BLOOD LEFT ARM   Final    Special Requests BOTTLES DRAWN AEROBIC AND ANAEROBIC 5CC   Final    Culture  Setup Time 01/05/2013 03:14   Final    Culture     Final    Value:        BLOOD CULTURE RECEIVED NO GROWTH TO DATE CULTURE WILL BE HELD FOR 5 DAYS BEFORE ISSUING A FINAL NEGATIVE REPORT   Report Status PENDING   Incomplete   MRSA PCR SCREENING     Status: Normal   Collection Time   01/05/13 12:33 AM      Component Value Range Status Comment   MRSA by PCR NEGATIVE  NEGATIVE Final   MRSA PCR SCREENING     Status: Normal   Collection Time   01/08/13  5:54 PM      Component Value Range Status Comment   MRSA by PCR NEGATIVE  NEGATIVE Final   URINE CULTURE     Status: Normal   Collection Time   01/09/13  5:28 PM      Component Value Range Status Comment     Specimen Description URINE, RANDOM   Final    Special Requests NONE   Final    Culture  Setup Time 01/10/2013 01:33   Final    Colony Count 8,000 COLONIES/ML  Final    Culture INSIGNIFICANT GROWTH   Final    Report Status 01/10/2013 FINAL   Final       Studies/Results: Mr Laqueta Jean WU Contrast  01/10/2013  *RADIOLOGY REPORT*  Clinical Data: 47 year old female with encephalopathy, hallucinations, altered mental status.  MRI HEAD WITHOUT AND WITH CONTRAST  Technique:  Multiplanar, multiecho pulse sequences of the brain and surrounding structures were obtained according to standard protocol without and with intravenous contrast  Contrast: 20mL MULTIHANCE GADOBENATE DIMEGLUMINE 529 MG/ML IV SOLN  Comparison: Head CT without contrast 05/23/2004.  Findings: No restricted diffusion to suggest acute infarction.  No midline shift, mass effect, evidence of mass lesion, ventriculomegaly, extra-axial collection or acute intracranial hemorrhage.  Cervicomedullary junction and pituitary are within normal limits.  Major intracranial vascular flow voids are preserved.  Scattered periventricular and subcortical white matter small foci of T2 and FLAIR hyperintensity, mild to moderate for age.  No associated enhancement.  No cortical encephalomalacia.  Deep gray matter nuclei, brainstem, cerebellum and visualized cervical spine are within normal limits. No abnormal enhancement identified.  Grossly normal orbits, visualization affected by motion.  Minor paranasal sinus mucosal thickening.  Small volume inferior bilateral mastoid effusions.  Negative visualized nasopharynx. Negative scalp soft tissues. Visualized bone marrow signal is within normal limits.  IMPRESSION: 1. No acute intracranial abnormality. 2.  Mild to moderate for age nonspecific white matter signal changes.   Original Report Authenticated By: Erskine Speed, M.D.     Medications:  Scheduled:    . antiseptic oral rinse  15 mL Mouth Rinse q12n4p  .  buPROPion  150 mg Oral Daily  . cefTRIAXone (ROCEPHIN)  IV  1 g Intravenous Q24H  . chlorhexidine  15 mL Mouth Rinse BID  . docusate sodium  100 mg Oral BID  . DULoxetine  60 mg Oral BID  . folic acid  1 mg Oral Daily  . furosemide  20 mg Oral Daily  . gabapentin  800 mg Oral TID  . LORazepam  1 mg Intravenous Q8H  . magnesium oxide  400 mg Oral BID  . multivitamin with minerals  1 tablet Oral Daily  . QUEtiapine  200 mg Oral BID  . sodium chloride  3 mL Intravenous Q12H  . thiamine  100 mg Oral Daily   Continuous:  JWJ:[id number], albuterol, albuterol, haloperidol lactate, menthol-cetylpyridinium, ondansetron (ZOFRAN) IV, ondansetron, traMADol  Assessment/Plan:  Active Problems:  Sarcoidosis  HYPOTHYROIDISM  TOBACCO DEPENDENCE  CIRRHOSIS, ALCOHOLIC, LIVER  HX, PERSONAL, SCHIZOPHRENIA  Bipolar 1 disorder  Acute URI  Hypokalemia  Hoarseness with URI symptoms  Alcohol withdrawal hallucinosis  Leukopenia  Thrombocytopenia  Delirium, acute  UTI (lower urinary tract infection)    Acute psychosis/Delirium/alcohol withdrawal DTs  Appears to be closer to baseline. It appears her current symptoms are secondary to psychiatric disease process. Appreciate Psyche input. Per Dr. Joelene Millin note she may not require inpatient psychiatric treatment. Continue Seroquel and Haldol. Close watch on Qt interval (so far, not prolonged). Apparently there is history of hallucinations even at baseline. 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 Will keep on low dose IV Ativan for alcohol withdrawal/?? Benzodiazepine withdrawal. Continue Thiamine. Change to oral Lorazepam. Due to history of 'seizure' in the past MRI and EEG was ordered. MRI does not show any concerning abnormalities. EEG report is pending.   UTI/Abnormal UA Urine cultures were negative. Ceftriaxone for 3 days total She remains afebrile.   History of Alcoholic cirrhosis  This appears to  be stable.  Abdominal ultrasound negative for ascites. Lipase normal.   Borderline Low BP Stable. Asymptomatic. Probably related to cirrhosis/meds. Continue to monitor.  Leukopenia, thrombocytopenia and macrocytosis  All chronic based on old labs. Likely secondary to alcohol abuse/cirrhosis. No overt bleeding.    Hypokalemia and hypomagnesemia  Improved. Secondary to alcohol abuse.   Hyponatremia  Improved. Likely from cirrhosis. Stable. Follow daily BMP   Tobacco abuse  Cessation counseled.   Bipolar 1 disorder/? Schizophrenia per son  Please see above.   Hoarseness with acute bronchitis  She states that she had vocal cord surgery 5 years ago. ? Viral etiology. Seems to have improved. ST consulted and did not see any swallowing issues.   History of Sarcoidosis with history of mild stable nodular disease.  Outpatient followup   DVT Prophylaxis SCD's   Code Status: Full  Family communication: Discussed with patient at bedside.  Disposition: Await further recommendations from Psychiatrist regarding disposition. Will she need inpatient treatment or can she be followed as outpatient? If latter, anticipate discharge 1/18. Will get PT and OT eval as patient reported unsteady gait.   LOS: 7 days   [facility]  [facility] Pager [phone] 01/11/2013, 10:46 AM  If 8PM-8AM, please contact night-coverage at [email or web address], password [facility]

## 2013-01-11 NOTE — Consult Note (Signed)
Reason for Consult:drug abuse, disorganized Referring Physician: unknown  Erin [name] is an 47 y.o. female.  HPI: Erin [name] is an 47 y.o. female with hx of alcohol dep, cannbis abuse, cocaine abuse, alcoholic cirrhosis, hypothyroidism, Bipolar disorder, schizophrenia, and sarcoidosis, presents to the ER as she was having increased tremors, nervousness, palpitation and symptoms of withdrawal. She also admitted to having increase sputum productive coughs, having subjective fever and chills. Evaluation in the ER showed tachycardia, tremors, and clinical presentation suspicious for alcohol withdrawal. She has a WBC of 1.4K, and platelet of 39K with normal hemoglobin. Her liver fx tests are elevated and her alcohol level is negative. Her CXR showed stable lymphadenopathy with no infiltrate. Hospitalist was asked to admit her for active alcohol withdrawal and upper respiratory infection.    Interval Hx:  Seen today and chart was reviewed. Reports les AVH now and happy about it. No command hallucinations. Better mood today.  More calmer now. Prefers to go home now after medical clearance.   Past Medical History  Diagnosis Date  . Hypothyroidism   . Blood transfusion July 2012  . Depression   . Anxiety   . Bipolar 1 disorder   . Menorrhagia   . Liver failure   . Bronchitis   . Cirrhosis of liver     Past Surgical History  Procedure Date  . Tubal ligation   . Laparoscopy   . Orthopedic surgery   . Cholecystectomy   . Fracture surgery   . Hernia repair   . Vaginal hysterectomy 07/27/2011    Procedure: HYSTERECTOMY VAGINAL;  Surgeon: Scheryl Darter, MD;  Location: [facility];  Service: Gynecology;  Laterality: N/A;  Vaginal Hysterectomy with Right Oophorectomy  . Laparotomy 07/28/2011    Procedure: EXPLORATORY LAPAROTOMY;  Surgeon: Tilda Burrow, MD;  Location: [facility];  Service: Gynecology;  Laterality: N/A;    Family History  Problem Relation Age of Onset  . Diabetes Mother   .  Hypertension Mother   . Diabetes Daughter   . Hypertension Daughter     Social History:  reports that she has been smoking.  She does not have any smokeless tobacco history on file. She reports that she drinks alcohol. She reports that she does not use illicit drugs.  Allergies:  Allergies  Allergen Reactions  . Fluoxetine Hcl Other (See Comments)     hyperactivity  . Metoclopramide Hcl Other (See Comments)    depression  . Morphine And Related Other (See Comments)    High doses cause hallucinations    Medications: I have reviewed the patient's current medications.  Results for orders placed during the hospital encounter of 01/04/13 (from the past 48 hour(s))  CBC     Status: Abnormal   Collection Time   01/10/13  3:30 AM      Component Value Range Comment   WBC 1.5 (*) 4.0 - 10.5 K/uL    RBC 3.74 (*) 3.87 - 5.11 MIL/uL    Hemoglobin 12.7  12.0 - 15.0 g/dL    HCT 47.8  29.5 - 62.1 %    MCV 100.8 (*) 78.0 - 100.0 fL    MCH 34.0  26.0 - 34.0 pg    MCHC 33.7  30.0 - 36.0 g/dL    RDW 30.8  65.7 - 84.6 %    Platelets 39 (*) 150 - 400 K/uL   BASIC METABOLIC PANEL     Status: Abnormal   Collection Time   01/10/13  3:30 AM  Component Value Range Comment   Sodium 131 (*) 135 - 145 mEq/L    Potassium 4.0  3.5 - 5.1 mEq/L    Chloride 96  96 - 112 mEq/L    CO2 27  19 - 32 mEq/L    Glucose, Bld 109 (*) 70 - 99 mg/dL    BUN 12  6 - 23 mg/dL    Creatinine, Ser 3.87  0.50 - 1.10 mg/dL    Calcium 8.6  8.4 - 56.4 mg/dL    GFR calc non Af Amer >90  >90 mL/min    GFR calc Af Amer >90  >90 mL/min   CBC     Status: Abnormal   Collection Time   01/11/13  5:07 AM      Component Value Range Comment   WBC 1.7 (*) 4.0 - 10.5 K/uL    RBC 3.69 (*) 3.87 - 5.11 MIL/uL    Hemoglobin 12.6  12.0 - 15.0 g/dL    HCT 33.2  95.1 - 88.4 %    MCV 101.1 (*) 78.0 - 100.0 fL    MCH 34.1 (*) 26.0 - 34.0 pg    MCHC 33.8  30.0 - 36.0 g/dL    RDW 16.6  06.3 - 01.6 %    Platelets 65 (*) 150 - 400 K/uL     BASIC METABOLIC PANEL     Status: Normal   Collection Time   01/11/13  5:07 AM      Component Value Range Comment   Sodium 136  135 - 145 mEq/L    Potassium 3.8  3.5 - 5.1 mEq/L    Chloride 99  96 - 112 mEq/L    CO2 29  19 - 32 mEq/L    Glucose, Bld 97  70 - 99 mg/dL    BUN 12  6 - 23 mg/dL    Creatinine, Ser 0.10  0.50 - 1.10 mg/dL    Calcium 9.1  8.4 - 93.2 mg/dL    GFR calc non Af Amer >90  >90 mL/min    GFR calc Af Amer >90  >90 mL/min     Mr Laqueta Jean Wo Contrast  01/10/2013  *RADIOLOGY REPORT*  Clinical Data: 47 year old female with encephalopathy, hallucinations, altered mental status.  MRI HEAD WITHOUT AND WITH CONTRAST  Technique:  Multiplanar, multiecho pulse sequences of the brain and surrounding structures were obtained according to standard protocol without and with intravenous contrast  Contrast: 20mL MULTIHANCE GADOBENATE DIMEGLUMINE 529 MG/ML IV SOLN  Comparison: Head CT without contrast 05/23/2004.  Findings: No restricted diffusion to suggest acute infarction.  No midline shift, mass effect, evidence of mass lesion, ventriculomegaly, extra-axial collection or acute intracranial hemorrhage.  Cervicomedullary junction and pituitary are within normal limits.  Major intracranial vascular flow voids are preserved.  Scattered periventricular and subcortical white matter small foci of T2 and FLAIR hyperintensity, mild to moderate for age.  No associated enhancement.  No cortical encephalomalacia.  Deep gray matter nuclei, brainstem, cerebellum and visualized cervical spine are within normal limits. No abnormal enhancement identified.  Grossly normal orbits, visualization affected by motion.  Minor paranasal sinus mucosal thickening.  Small volume inferior bilateral mastoid effusions.  Negative visualized nasopharynx. Negative scalp soft tissues. Visualized bone marrow signal is within normal limits.  IMPRESSION: 1. No acute intracranial abnormality. 2.  Mild to moderate for age nonspecific  white matter signal changes.   Original Report Authenticated By: Erskine Speed, M.D.     Review of Systems  Gastrointestinal: Positive  for constipation and blood in stool.   Blood pressure 113/75, pulse 81, temperature 98.5 F (36.9 C), temperature source Oral, resp. rate 18, height 5\' 7"  (1.702 m), weight 113.8 kg (250 lb 14.1 oz), SpO2 95.00%. Physical Exam   MSE:   Appearance:   overweight  Behavior:  calm Speech: fair  Mood:  anxious  Affect: ristricted Thought Process: diorganized Thought Content: AVH at times No si  Sensorium:  Person only  Cognition:  poor Insight:  limited  NO SI, NO HI,    Assessment/   Axis I: delirium nos (resolving), hx of  alcohol dep, cannbis abuse, cocaine abuse  Axis II: deferred  Axis III: see med hx  Axis IV: unknown Axis V: 40  Plan:   1. Pt is getting close to her baseline.  2. Will recommend to taper off from Benzo grradually  3. Increase Seroquel to 300 mg BID . recommend to observe QTC as seroquel can cause it but it less potential as compared with others  4. Will recommend out pt psy f/u after d/c. Psy SW can help. Pt also prefers to do that  5. Will follow    Wonda Cerise 01/11/2013, 8:15 PM

## 2013-01-11 NOTE — Progress Notes (Signed)
Chart review.  Noted that MD stating that Pt may be ready for d/c on 01/12/13.    Outpt needs met, as psych CSW gave Pt's information on [facility].  Psych MD to see Pt today for final recommendations.  If psych MD recommends inpt tx, Weekend CSW, Elonda Husky [phone]), to assist.  [place], [facility] Clinical Social Work [phone]

## 2013-01-12 DIAGNOSIS — R0602 Shortness of breath: Secondary | ICD-10-CM

## 2013-01-12 MED ORDER — DULOXETINE HCL 60 MG PO CPEP: 60.0000 mg | ORAL_CAPSULE | Freq: Two times a day (BID) | ORAL | Status: DC

## 2013-01-12 MED ORDER — QUETIAPINE FUMARATE 300 MG PO TABS: 300.0000 mg | ORAL_TABLET | Freq: Two times a day (BID) | ORAL | Status: DC

## 2013-01-12 MED ORDER — ALBUTEROL SULFATE HFA 108 (90 BASE) MCG/ACT IN AERS: 1.0000 | INHALATION_SPRAY | Freq: Four times a day (QID) | RESPIRATORY_TRACT | Status: DC | PRN

## 2013-01-12 MED ORDER — THIAMINE HCL 100 MG PO TABS: 100.0000 mg | ORAL_TABLET | Freq: Every day | ORAL | Status: DC

## 2013-01-12 MED ORDER — ADULT MULTIVITAMIN W/MINERALS CH: 1.0000 | ORAL_TABLET | Freq: Every day | ORAL | Status: DC

## 2013-01-12 MED ORDER — BUPROPION HCL ER (SR) 150 MG PO TB12: 150.0000 mg | ORAL_TABLET | Freq: Every day | ORAL | Status: DC

## 2013-01-12 MED ORDER — GABAPENTIN 800 MG PO TABS: 800.0000 mg | ORAL_TABLET | Freq: Three times a day (TID) | ORAL | Status: DC

## 2013-01-12 MED ORDER — FOLIC ACID 1 MG PO TABS: 1.0000 mg | ORAL_TABLET | Freq: Every day | ORAL | Status: DC

## 2013-01-12 MED ORDER — CLONAZEPAM 0.5 MG PO TABS: 0.5000 mg | ORAL_TABLET | Freq: Two times a day (BID) | ORAL | Status: DC | PRN

## 2013-01-12 MED ORDER — FUROSEMIDE 20 MG PO TABS: 20.0000 mg | ORAL_TABLET | Freq: Every day | ORAL | Status: DC

## 2013-01-12 NOTE — Progress Notes (Signed)
PT Cancellation Note  Patient Details Name: Erin [name] MRN: [id number] DOB: 04-08-1966   Cancelled Treatment:    Reason Eval/Treat Not Completed: Other (comment) (discharged home, reports no needs.)  Spoke with RN who reports patient mobilizing independently.  Patient reports son will be at home initially to assist.   [facility] 01/12/2013, 12:20 PM [place], [address] [phone] 01/12/2013

## 2013-01-12 NOTE — Progress Notes (Signed)
Patient and son has received discharge instructions. Prescriptions have been given to patient. Instructions and resources for follow up with psychiatry has been provided to patient and son. Patient and son both verbalize understanding. Patient has been instructed not to consume alcohol. Patient was taken down in a wheelchair and is being transported by her son via personal vehicle.

## 2013-01-12 NOTE — Procedures (Signed)
EEG report.  Brief clinical history: 47 years old female admitted to the hospital with alcohol withdrawal and altered behavior. No prior history of frank epileptic seizures.  Technique: this is a 17 channel routine scalp EEG performed at the bedside with bipolar and monopolar montages arranged in accordance to the international 10/20 system of electrode placement. One channel was dedicated to EKG recording.  No sleep achieved during this recording. No activating procedures employed during the test.  Description: In the wakeful state,  The best background consisted of a posterior dominant, medium amplitude, well sustained, symmetric, and reactive 11 Hz rhythm. No sleep achieved during recording.  No focal or generalized epileptiform discharges noted.  No pathologic areas of slowing seen.  EKG showed sinus rhythm.  Impression: this is a normal awake EEG. Please, be aware that a normal EEG does not exclude the possibility of epilepsy.  Clinical correlation is advised.  Wyatt Portela, MD

## 2013-01-12 NOTE — Discharge Summary (Signed)
[facility]  Physician Discharge Summary   Patient ID: Erin [name] MRN: [id number] DOB/AGE: 1966-02-03 47 y.o.  Admit date: 01/04/2013 Discharge date: 01/12/2013  PCP: None  DISCHARGE DIAGNOSES:  Active Problems:  Sarcoidosis  HYPOTHYROIDISM  TOBACCO DEPENDENCE  CIRRHOSIS, ALCOHOLIC, LIVER  HX, PERSONAL, SCHIZOPHRENIA  Bipolar 1 disorder  Acute URI  Hypokalemia  Hoarseness with URI symptoms  Alcohol withdrawal hallucinosis  Leukopenia  Thrombocytopenia  Delirium, acute  UTI (lower urinary tract infection)   RECOMMENDATIONS FOR OUTPATIENT FOLLOW UP: 1. Needs close follow up with Psychiatrist.  DISCHARGE CONDITION: fair  Diet recommendation: Regular  Filed Weights   01/07/13 1345 01/08/13 1750 01/09/13 0400  Weight: 111.585 kg (246 lb) 111.8 kg (246 lb 7.6 oz) 113.8 kg (250 lb 14.1 oz)    INITIAL HISTORY: Erin [name] is an 47 y.o. female with hx of episodic alcohol abuse, alcoholic cirrhosis, hypothyroidism, Bipolar disorder, schizophrenia, and sarcoidosis, presented to the ED on 1/10 as she was having increased tremors, nervousness, palpitation and symptoms of withdrawal. She also admitted to having increase sputum productive coughs, having subjective fever and chills. Evaluation in the ER showed tachycardia, tremors, and clinical presentation suspicious for alcohol withdrawal. She has a WBC of 1.4K, and platelet of 39K with normal hemoglobin. Her liver fx tests are elevated and her alcohol level is negative. Her CXR showed stable lymphadenopathy with no infiltrate. Hospitalist was asked to admit her for active alcohol withdrawal and upper respiratory infection. Patient was initially admitted to ICU and started on Ativan protocol. Overnight on 1/11, withdrawal worsened and patient was started on Precedex infusion by critical care medicine. She then improved. Precedex drip was stopped early 1/13 am. She was transferred to telemetry on 1/13. On 1/14 morning,  she progressively worsened-increased confusion, worsening hallucinations-seen her demised family members and other objects, pacing in the room and floor, wandered off the hospital and had to be brought back by security. She was then moved back to ICU.  Consultations:  Psychiatry  Critical care medicine  Procedures:  EEG: No epileptiform activities.  HOSPITAL COURSE:   Acute psychosis/Delirium/alcohol withdrawal DTs  Patient's mental status has significantly improved. She is now at her baseline. Per psychiatry she doesn't need inpatient care. Will have CSW give her resources for outpatient follow up. Will give her 1 month worth of prescription medications. Continue Seroquel. QT interval was closely monitored and this remained normal. Apparently there is history of hallucinations even at baseline. 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 She was kept on Ativan for possible benzodiazepine withdrawal. Will be discharged on a few tablets of Klonopin. Due to history of 'seizure' in the past, MRI and EEG was ordered. MRI does not show any concerning abnormalities. EEG was unremarkable as well.   UTI/Abnormal UA  Urine cultures were negative. She received Ceftriaxone for 3 days total. She remains afebrile.   History of Alcoholic cirrhosis  This appears to be stable. Abdominal ultrasound negative for ascites. Lipase normal. She was counseled extensively to stay off alcohol. Continue low dose diuretics.  Leukopenia, thrombocytopenia and macrocytosis  All chronic based on old labs. Likely secondary to alcohol abuse/cirrhosis. No overt bleeding was noted.  Bipolar 1 disorder/Questionable Schizophrenia Please see above. Will be discharged on Cymbalta, Welbutrin, Seroqul and Neurontin.  Hoarseness with acute bronchitis  She states that she had vocal cord surgery 5 years ago. Could have been viral. She is now back to normal. She was given inhalers. Speech therapist  was consulted  and did not see any swallowing issues.   History of Sarcoidosis with history of mild stable nodular disease.  Outpatient followup. She will be provided with information for primary care providers.  Patient has been ambulating with no difficulty. She is keen on going home. She lives in a retirement facility. CSW will facilitate follow up with Psychiatry.  PERTINENT LABS:  The results of significant diagnostics from this hospitalization (including imaging, microbiology, ancillary and laboratory) are listed below for reference.    Microbiology: Recent Results (from the past 240 hour(s))  CULTURE, BLOOD (ROUTINE X 2)     Status: Normal   Collection Time   01/04/13  3:04 PM      Component Value Range Status Comment   Specimen Description BLOOD   Final    Special Requests BOTTLES DRAWN AEROBIC AND ANAEROBIC 10CC   Final    Culture  Setup Time 01/05/2013 01:15   Final    Culture NO GROWTH 5 DAYS   Final    Report Status 01/11/2013 FINAL   Final   CULTURE, BLOOD (ROUTINE X 2)     Status: Normal   Collection Time   01/04/13  3:35 PM      Component Value Range Status Comment   Specimen Description BLOOD LEFT ARM   Final    Special Requests BOTTLES DRAWN AEROBIC AND ANAEROBIC 5CC   Final    Culture  Setup Time 01/05/2013 03:14   Final    Culture NO GROWTH 5 DAYS   Final    Report Status 01/11/2013 FINAL   Final   MRSA PCR SCREENING     Status: Normal   Collection Time   01/05/13 12:33 AM      Component Value Range Status Comment   MRSA by PCR NEGATIVE  NEGATIVE Final   MRSA PCR SCREENING     Status: Normal   Collection Time   01/08/13  5:54 PM      Component Value Range Status Comment   MRSA by PCR NEGATIVE  NEGATIVE Final   URINE CULTURE     Status: Normal   Collection Time   01/09/13  5:28 PM      Component Value Range Status Comment   Specimen Description URINE, RANDOM   Final    Special Requests NONE   Final    Culture  Setup Time 01/10/2013 01:33   Final    Colony  Count 8,000 COLONIES/ML   Final    Culture INSIGNIFICANT GROWTH   Final    Report Status 01/10/2013 FINAL   Final      Labs: Basic Metabolic Panel:  Lab 01/11/13 6213 01/10/13 0330 01/09/13 0335 01/08/13 0515 01/07/13 0351 01/06/13 1035  NA 136 131* 133* 128* 128* --  K 3.8 4.0 3.6 3.4* 4.3 --  CL 99 96 97 91* 93* --  CO2 29 27 28 26 25  --  GLUCOSE 97 109* 100* 110* 120* --  BUN 12 12 12 12  5* --  CREATININE 0.72 0.69 0.81 0.86 0.66 --  CALCIUM 9.1 8.6 8.6 8.8 8.7 --  MG -- -- 1.5 -- -- 1.9  PHOS -- -- -- -- -- --   Liver Function Tests:  Lab 01/09/13 0335 01/08/13 0515 01/07/13 0351 01/06/13 1036  AST 70* 76* 81* 86*  ALT 30 32 33 34  ALKPHOS 117 130* 133* 140*  BILITOT 3.3* 3.2* 2.4* 2.6*  PROT 6.3 6.7 6.5 6.5  ALBUMIN 2.9* 3.2* 3.0* 2.9*    Lab 01/09/13 0335  AMMONIA 26   CBC:  Lab 01/11/13 0507 01/10/13 0330 01/09/13 0335 01/08/13 0515 01/07/13 0350  WBC 1.7* 1.5* 2.0* 3.5* 2.1*  NEUTROABS -- -- -- -- 1.3*  HGB 12.6 12.7 13.1 13.9 13.4  HCT 37.3 37.7 38.7 39.9 39.4  MCV 101.1* 100.8* 100.0 98.3 99.0  PLT 65* 39* 43* 42* 32*    IMAGING STUDIES Dg Chest 2 View  01/04/2013  *RADIOLOGY REPORT*  Clinical Data: Shortness of breath and cough  CHEST - 2 VIEW  Comparison: September 04, 2012 chest radiograph and September 01, 2012 chest CT  Findings:  Adenopathy in the right paratracheal and hilar regions appears stable.  Currently there is no edema or consolidation.  The heart size and pulmonary vascularity normal.  No bone lesions.  IMPRESSION: Stable adenopathy.  No edema or consolidation.  Nodular pulmonary lesions seen on prior chest CT are not seen on this study.  Given those findings on CT, further surveillance remains warranted.   Original Report Authenticated By: Bretta Bang, M.D.    Mr Laqueta Jean Wo Contrast  01/10/2013  *RADIOLOGY REPORT*  Clinical Data: 47 year old female with encephalopathy, hallucinations, altered mental status.  MRI HEAD WITHOUT AND WITH  CONTRAST  Technique:  Multiplanar, multiecho pulse sequences of the brain and surrounding structures were obtained according to standard protocol without and with intravenous contrast  Contrast: 20mL MULTIHANCE GADOBENATE DIMEGLUMINE 529 MG/ML IV SOLN  Comparison: Head CT without contrast 05/23/2004.  Findings: No restricted diffusion to suggest acute infarction.  No midline shift, mass effect, evidence of mass lesion, ventriculomegaly, extra-axial collection or acute intracranial hemorrhage.  Cervicomedullary junction and pituitary are within normal limits.  Major intracranial vascular flow voids are preserved.  Scattered periventricular and subcortical white matter small foci of T2 and FLAIR hyperintensity, mild to moderate for age.  No associated enhancement.  No cortical encephalomalacia.  Deep gray matter nuclei, brainstem, cerebellum and visualized cervical spine are within normal limits. No abnormal enhancement identified.  Grossly normal orbits, visualization affected by motion.  Minor paranasal sinus mucosal thickening.  Small volume inferior bilateral mastoid effusions.  Negative visualized nasopharynx. Negative scalp soft tissues. Visualized bone marrow signal is within normal limits.  IMPRESSION: 1. No acute intracranial abnormality. 2.  Mild to moderate for age nonspecific white matter signal changes.   Original Report Authenticated By: Erskine Speed, M.D.    Ct Abdomen Pelvis W Contrast  01/04/2013  *RADIOLOGY REPORT*  Clinical Data: 47 year old female with cirrhosis.  Nausea. Alcohol abuse  CT ABDOMEN AND PELVIS WITH CONTRAST  Technique:  Multidetector CT imaging of the abdomen and pelvis was performed following the standard protocol during bolus administration of intravenous contrast.  Contrast: OMNIPAQUE IOHEXOL 300 MG/ML  SOLN  Comparison: CT 07/13/2012  Findings: Lung bases are clear.  No pericardial fluid.  There is diffuse fatty infiltration the liver.  The liver is enlarged with a  lobular contour.  Portal veins are patent.  Splenic vein is enlarged.  The spleen is enlarged to a calculated volume of 512 ml. No evidence of ascites.  Pancreas is normal.  Post cholecystectomy.  Adrenal glands and kidneys are normal.  There is perinephric stranding surrounding the kidneys which is similar to prior.  No obstructive uropathy.  The stomach, small bowel, appendix, and cecum are normal.  The colon is collapsed.  No acute findings.  Abdominal aorta is normal caliber.  There are shotty retroperitoneal periaortic lymph nodes similar to prior.  The bladder is normal.  Post hysterectomy anatomy.  There is a small ventral hernia in the lower abdomen just above the pubic symphysis which contains a short segment of nonobstructed small bowel (image 75).  This is similar to prior.  Post hysterectomy anatomy.  The ovaries are normal. Review of  bone windows demonstrates no aggressive osseous lesions.  IMPRESSION:  1.  No acute abdominal or pelvic findings.  No significant change from prior. 2.  Sequelae of cirrhosis with portal hypertension.  No ascites. 3.  Small midline ventral hernia contains a short segment of nonobstructed small bowel just above the pubic symphysis.   Original Report Authenticated By: Genevive Bi, M.D.    US Abdomen Limited  01/05/2013  *RADIOLOGY REPORT*  Clinical Data: Cirrhosis and abdominal distention.  LIMITED ABDOMEN ULTRASOUND FOR ASCITES  Technique:  Limited ultrasound survey for ascites was performed in all four abdominal quadrants.  Comparison:  CT scan 01/04/2013.  Findings: No abdominal/pelvic ascites is demonstrated.  IMPRESSION: Negative ultrasound for ascites.   Original Report Authenticated By: Rudie Meyer, M.D.     DISCHARGE EXAMINATION: Filed Vitals:   01/11/13 0516 01/11/13 1500 01/11/13 2200 01/12/13 0600  BP: 101/68 113/75 120/81 100/68  Pulse: 75 81 80 74  Temp: 98.2 F (36.8 C) 98.5 F (36.9 C) 98.4 F (36.9 C) 97.8 F (36.6 C)  TempSrc: Oral Oral  Oral Oral  Resp: 18 18 16 18   Height:      Weight:      SpO2: 93% 95% 94% 93%   General appearance: alert, cooperative, appears stated age, no distress and moderately obese Resp: clear to auscultation bilaterally Cardio: regular rate and rhythm, S1, S2 normal, no murmur, click, rub or gallop Neurologic: Alert and oriented x 3. No focal deficits.  DISPOSITION: Home at a retirement facility  Discharge Orders    Future Orders Please Complete By Expires   Diet general      Increase activity slowly      Discharge instructions      Comments:   Be sure to follow up with a psychiatrist for your mental health needs. Please don't consume alcohol.     Current Discharge Medication List    CONTINUE these medications which have CHANGED   Details  albuterol (PROVENTIL HFA;VENTOLIN HFA) 108 (90 BASE) MCG/ACT inhaler Inhale 1-2 puffs into the lungs every 6 (six) hours as needed for wheezing. Qty: 1 Inhaler, Refills: 0    buPROPion (WELLBUTRIN SR) 150 MG 12 hr tablet Take 1 tablet (150 mg total) by mouth daily. For depression Qty: 30 tablet, Refills: 0    clonazePAM (KLONOPIN) 0.5 MG tablet Take 1 tablet (0.5 mg total) by mouth 2 (two) times daily as needed. anxiety Qty: 15 tablet, Refills: 0    DULoxetine (CYMBALTA) 60 MG capsule Take 1 capsule (60 mg total) by mouth 2 (two) times daily. Qty: 60 capsule, Refills: 0    folic acid (FOLVITE) 1 MG tablet Take 1 tablet (1 mg total) by mouth daily. Qty: 30 tablet, Refills: 0    furosemide (LASIX) 20 MG tablet Take 1 tablet (20 mg total) by mouth daily. Qty: 30 tablet, Refills: 0    gabapentin (NEURONTIN) 800 MG tablet Take 1 tablet (800 mg total) by mouth 3 (three) times daily. Qty: 90 tablet, Refills: 0    Multiple Vitamin (MULTIVITAMIN WITH MINERALS) TABS Take 1 tablet by mouth daily. Qty: 30 tablet, Refills: 0    QUEtiapine (SEROQUEL) 300 MG tablet Take 1 tablet (300 mg total) by mouth 2 (two) times daily. Qty: 60 tablet,  Refills: 0     thiamine 100 MG tablet Take 1 tablet (100 mg total) by mouth daily. Qty: 30 tablet, Refills: 0      CONTINUE these medications which have NOT CHANGED   Details  promethazine (PHENERGAN) 25 MG tablet Take 1 tablet (25 mg total) by mouth every 6 (six) hours as needed for nausea. Qty: 20 tablet, Refills: 0      STOP taking these medications     carisoprodol (SOMA) 350 MG tablet      Pseudoephedrine-DM-GG (ROBITUSSIN CF PO)        Follow-up Information    Schedule an appointment as soon as possible for a visit with Psychiatrist of choice.         TOTAL DISCHARGE TIME: 35 mins  [facility]  [facility] Pager [phone]  01/12/2013, 10:44 AM

## 2013-01-12 NOTE — Progress Notes (Signed)
CSW met with the Pt at the bedside to provide d/c planning with behavioral health and substance abuse resources.   Pt is aware that she will need to locate a psychiatrist in the area in order to obtain medication.   Pt was given listing of psychiatrist in the area and was instructed to contact [facility] for assistance if needed and also to inquire about the IOP services available.   CSW stressed the importance of abstinence from alcohol for both her recovery and health concerns.   Pt voiced understanding and stated that she will be looking for a psychiatrist and rehab program.    Leron Croak, Judie Grieve Weekend Coverage [phone]

## 2013-01-12 NOTE — Care Management (Signed)
Cm spoke with pt concerning dc planning. Pt provided list of PCP providers & other community resources. No other needs stated.  Leonie Green [phone]

## 2013-07-15 ENCOUNTER — Emergency Department (EMERGENCY_DEPARTMENT_HOSPITAL)
Admission: EM | Admit: 2013-07-15 | Discharge: 2013-07-16 | Disposition: A | Discharge status: Other Acute Inpatient Hospital | Payer: Medicare Other | Source: Home / Self Care | Attending: Emergency Medicine | Admitting: Emergency Medicine

## 2013-07-15 ENCOUNTER — Encounter (HOSPITAL_COMMUNITY): Payer: Self-pay | Admitting: *Deleted

## 2013-07-15 DIAGNOSIS — F319 Bipolar disorder, unspecified: Secondary | ICD-10-CM | POA: Insufficient documentation

## 2013-07-15 DIAGNOSIS — F1994 Other psychoactive substance use, unspecified with psychoactive substance-induced mood disorder: Secondary | ICD-10-CM

## 2013-07-15 DIAGNOSIS — K7460 Unspecified cirrhosis of liver: Secondary | ICD-10-CM | POA: Insufficient documentation

## 2013-07-15 DIAGNOSIS — Z0289 Encounter for other administrative examinations: Secondary | ICD-10-CM | POA: Insufficient documentation

## 2013-07-15 DIAGNOSIS — E039 Hypothyroidism, unspecified: Secondary | ICD-10-CM | POA: Insufficient documentation

## 2013-07-15 DIAGNOSIS — F1910 Other psychoactive substance abuse, uncomplicated: Secondary | ICD-10-CM

## 2013-07-15 DIAGNOSIS — F1410 Cocaine abuse, uncomplicated: Secondary | ICD-10-CM | POA: Insufficient documentation

## 2013-07-15 DIAGNOSIS — F411 Generalized anxiety disorder: Secondary | ICD-10-CM | POA: Insufficient documentation

## 2013-07-15 DIAGNOSIS — Z79899 Other long term (current) drug therapy: Secondary | ICD-10-CM | POA: Insufficient documentation

## 2013-07-15 DIAGNOSIS — F10232 Alcohol dependence with withdrawal with perceptual disturbance: Secondary | ICD-10-CM

## 2013-07-15 DIAGNOSIS — F10932 Alcohol use, unspecified with withdrawal with perceptual disturbance: Secondary | ICD-10-CM

## 2013-07-15 DIAGNOSIS — F1010 Alcohol abuse, uncomplicated: Secondary | ICD-10-CM | POA: Insufficient documentation

## 2013-07-15 DIAGNOSIS — F17200 Nicotine dependence, unspecified, uncomplicated: Secondary | ICD-10-CM | POA: Insufficient documentation

## 2013-07-15 LAB — COMPREHENSIVE METABOLIC PANEL
ALT: 21 U/L (ref 0–35)
Albumin: 3.3 g/dL — ABNORMAL LOW (ref 3.5–5.2)
Alkaline Phosphatase: 103 U/L (ref 39–117)
BUN: 7 mg/dL (ref 6–23)
Chloride: 102 mEq/L (ref 96–112)
Potassium: 4 mEq/L (ref 3.5–5.1)
Sodium: 137 mEq/L (ref 135–145)
Total Bilirubin: 1.4 mg/dL — ABNORMAL HIGH (ref 0.3–1.2)
Total Protein: 6.7 g/dL (ref 6.0–8.3)

## 2013-07-15 LAB — RAPID URINE DRUG SCREEN, HOSP PERFORMED
Amphetamines: NOT DETECTED
Barbiturates: NOT DETECTED
Benzodiazepines: NOT DETECTED
Cocaine: POSITIVE — AB
Tetrahydrocannabinol: POSITIVE — AB

## 2013-07-15 LAB — CBC
HCT: 36 % (ref 36.0–46.0)
Hemoglobin: 12.3 g/dL (ref 12.0–15.0)
RDW: 13.3 % (ref 11.5–15.5)
WBC: 1.5 10*3/uL — ABNORMAL LOW (ref 4.0–10.5)

## 2013-07-15 LAB — ETHANOL: Alcohol, Ethyl (B): <11 mg/dL (ref 0–11)

## 2013-07-15 MED ORDER — VITAMIN B-1 100 MG PO TABS
100.0000 mg | ORAL_TABLET | Freq: Every day | ORAL | Status: DC
  Administered 2013-07-15: 100 mg via ORAL
  Filled 2013-07-15 (×2): qty 1

## 2013-07-15 MED ORDER — LORAZEPAM 2 MG/ML IJ SOLN: 1.0000 mg | Freq: Four times a day (QID) | INTRAMUSCULAR | Status: DC | PRN

## 2013-07-15 MED ORDER — IBUPROFEN 200 MG PO TABS
600.0000 mg | ORAL_TABLET | Freq: Three times a day (TID) | ORAL | Status: DC | PRN
  Administered 2013-07-16: 600 mg via ORAL
  Filled 2013-07-15: qty 3

## 2013-07-15 MED ORDER — LORAZEPAM 1 MG PO TABS
1.0000 mg | ORAL_TABLET | Freq: Four times a day (QID) | ORAL | Status: DC | PRN
  Administered 2013-07-16 (×2): 1 mg via ORAL
  Filled 2013-07-15: qty 1

## 2013-07-15 MED ORDER — FOLIC ACID 1 MG PO TABS
1.0000 mg | ORAL_TABLET | Freq: Every day | ORAL | Status: DC
  Administered 2013-07-15: 1 mg via ORAL
  Filled 2013-07-15 (×2): qty 1

## 2013-07-15 MED ORDER — ZOLPIDEM TARTRATE 5 MG PO TABS
5.0000 mg | ORAL_TABLET | Freq: Every evening | ORAL | Status: DC | PRN
  Administered 2013-07-15: 5 mg via ORAL
  Filled 2013-07-15: qty 1

## 2013-07-15 MED ORDER — NICOTINE 21 MG/24HR TD PT24: 21.0000 mg | MEDICATED_PATCH | Freq: Every day | TRANSDERMAL | Status: DC | PRN

## 2013-07-15 MED ORDER — LORAZEPAM 1 MG PO TABS
0.0000 mg | ORAL_TABLET | Freq: Four times a day (QID) | ORAL | Status: DC
  Administered 2013-07-15: 1 mg via ORAL
  Administered 2013-07-16: 2 mg via ORAL
  Filled 2013-07-15 (×2): qty 1
  Filled 2013-07-15: qty 2

## 2013-07-15 MED ORDER — ACETAMINOPHEN 325 MG PO TABS: 650.0000 mg | ORAL_TABLET | ORAL | Status: DC | PRN

## 2013-07-15 MED ORDER — LORAZEPAM 1 MG PO TABS: 0.0000 mg | ORAL_TABLET | Freq: Two times a day (BID) | ORAL | Status: DC

## 2013-07-15 MED ORDER — ADULT MULTIVITAMIN W/MINERALS CH
1.0000 | ORAL_TABLET | Freq: Every day | ORAL | Status: DC
  Administered 2013-07-15 – 2013-07-16 (×3): 1 via ORAL
  Filled 2013-07-15 (×2): qty 1

## 2013-07-15 MED ORDER — THIAMINE HCL 100 MG/ML IJ SOLN: 100.0000 mg | Freq: Every day | INTRAMUSCULAR | Status: DC

## 2013-07-15 MED ORDER — ONDANSETRON HCL 4 MG PO TABS: 4.0000 mg | ORAL_TABLET | Freq: Three times a day (TID) | ORAL | Status: DC | PRN

## 2013-07-15 NOTE — ED Provider Notes (Signed)
History    This chart was scribed for Sharilyn Sites, PA working with Ashby Dawes, MD by Quintella Reichert, ED Scribe. This patient was seen in room WTR3/WLPT3 and the patient's care was started at 6:10 PM.   CSN: [id number]  Arrival date & time 07/15/13  1731    Chief Complaint  Patient presents with  . Medical Clearance    The history is provided by the patient. No language interpreter was used.    HPI Comments: Erin [name] is a 47 y.o. female with h/o cirrhosis, liver failure, bipolar disorder, depression and anxiety who presents to the Emergency Department complaining of ETOH and crack cocaine abuse and requesting detox.  Pt last used both last night.  She states that she has no prior h/o illicit drug dependence and became addicted to crack because she is in a relationship with someone else who is addicted.  She has been smoking crack on a daily basis for the past 2 months.  She also admits to a long term history of alcohol abuse and states she drinks wine mixed with liquor and beer on a daily basis.  Pt also reports she has been "snorting all kinds of pills so I can sleep."  She has attempted to stop using crack without assistance but has been unable to do so.  She is seeking treatment in part because she is experiencing financial difficulties due to her addiction. Pt denies prior h/o detox admission.  States she has not been taking her home meds because she does not have them.  Denies SI/HI/AVH.   Past Medical History  Diagnosis Date  . Hypothyroidism   . Blood transfusion July 2012  . Depression   . Anxiety   . Bipolar 1 disorder   . Menorrhagia   . Liver failure   . Bronchitis   . Cirrhosis of liver     Past Surgical History  Procedure Laterality Date  . Tubal ligation    . Laparoscopy    . Orthopedic surgery    . Cholecystectomy    . Fracture surgery    . Hernia repair    . Vaginal hysterectomy  07/27/2011    Procedure: HYSTERECTOMY VAGINAL;  Surgeon:  Scheryl Darter, MD;  Location: [facility];  Service: Gynecology;  Laterality: N/A;  Vaginal Hysterectomy with Right Oophorectomy  . Laparotomy  07/28/2011    Procedure: EXPLORATORY LAPAROTOMY;  Surgeon: Tilda Burrow, MD;  Location: [facility];  Service: Gynecology;  Laterality: N/A;    Family History  Problem Relation Age of Onset  . Diabetes Mother   . Hypertension Mother   . Diabetes Daughter   . Hypertension Daughter     History  Substance Use Topics  . Smoking status: Current Some Day Smoker -- 0.25 packs/day  . Smokeless tobacco: Never Used  . Alcohol Use: Yes     Comment: occasional    OB History   Grav Para Term Preterm Abortions TAB SAB Ect Mult Living   4 3 3  0 1     3       Review of Systems  Psychiatric/Behavioral:       Detox  All other systems reviewed and are negative.      Allergies  Fluoxetine hcl; Metoclopramide hcl; and Morphine and related  Home Medications   Current Outpatient Rx  Name  Route  Sig  Dispense  Refill  . albuterol (PROVENTIL HFA;VENTOLIN HFA) 108 (90 BASE) MCG/ACT inhaler   Inhalation   Inhale 1-2 puffs  into the lungs every 6 (six) hours as needed for wheezing.   1 Inhaler   0   . ALPRAZolam (XANAX) 1 MG tablet   Oral   Take 1 mg by mouth 4 (four) times daily as needed for anxiety.         Marland Kitchen buPROPion (WELLBUTRIN SR) 150 MG 12 hr tablet   Oral   Take 1 tablet (150 mg total) by mouth daily. For depression   30 tablet   0   . DULoxetine (CYMBALTA) 60 MG capsule   Oral   Take 1 capsule (60 mg total) by mouth 2 (two) times daily.   60 capsule   0   . folic acid (FOLVITE) 1 MG tablet   Oral   Take 1 tablet (1 mg total) by mouth daily.   30 tablet   0   . furosemide (LASIX) 20 MG tablet   Oral   Take 1 tablet (20 mg total) by mouth daily.   30 tablet   0   . gabapentin (NEURONTIN) 800 MG tablet   Oral   Take 1 tablet (800 mg total) by mouth 3 (three) times daily.   90 tablet   0   . HYDROcodone-acetaminophen  (NORCO/VICODIN) 5-325 MG per tablet   Oral   Take 1 tablet by mouth every 8 (eight) hours as needed for pain.         . Multiple Vitamin (MULTIVITAMIN WITH MINERALS) TABS   Oral   Take 1 tablet by mouth daily.   30 tablet   0   . oxyCODONE-acetaminophen (PERCOCET) 10-325 MG per tablet   Oral   Take 1 tablet by mouth every 8 (eight) hours as needed for pain.         Marland Kitchen QUEtiapine (SEROQUEL) 300 MG tablet   Oral   Take 1 tablet (300 mg total) by mouth 2 (two) times daily.   60 tablet   0   . thiamine 100 MG tablet   Oral   Take 1 tablet (100 mg total) by mouth daily.   30 tablet   0    BP 134/77  Pulse 89  Temp(Src) 97.8 F (36.6 C) (Oral)  Resp 18  Ht 5\' 7"  (1.702 m)  Wt 240 lb (108.863 kg)  BMI 37.58 kg/m2  SpO2 100%  Physical Exam  Nursing note and vitals reviewed. Constitutional: She is oriented to person, place, and time. She appears well-developed and well-nourished. No distress.  HENT:  Head: Normocephalic and atraumatic.  Mouth/Throat: Oropharynx is clear and moist.  Eyes: Conjunctivae and EOM are normal. Pupils are equal, round, and reactive to light.  Neck: Normal range of motion. Neck supple.  Cardiovascular: Normal rate, regular rhythm and normal heart sounds.   Pulmonary/Chest: Effort normal and breath sounds normal.  Abdominal: Soft. Bowel sounds are normal. There is no tenderness. There is no guarding.  Musculoskeletal: Normal range of motion. She exhibits no edema.  Neurological: She is alert and oriented to person, place, and time.  Skin: Skin is warm and dry. She is not diaphoretic.  Psychiatric: Her speech is normal. Her mood appears anxious. She is not actively hallucinating. Thought content is not delusional. She expresses no homicidal and no suicidal ideation. She expresses no suicidal plans and no homicidal plans.  Calm and cooperate with exam, somewhat anxious, denies SI/HI/AVH    ED Course  Procedures (including critical care  time)  DIAGNOSTIC STUDIES: Oxygen Saturation is 100% on room air, normal by my interpretation.  COORDINATION OF CARE: 6:15 PM-Discussed treatment plan which includes labs and evaluation by behavioral health with pt at bedside and pt agreed to plan.     Labs Reviewed  CBC - Abnormal; Notable for the following:    WBC 1.5 (*)    RBC 3.81 (*)    Platelets 70 (*)    All other components within normal limits  COMPREHENSIVE METABOLIC PANEL - Abnormal; Notable for the following:    Glucose, Bld 135 (*)    Albumin 3.3 (*)    AST 41 (*)    Total Bilirubin 1.4 (*)    All other components within normal limits  SALICYLATE LEVEL - Abnormal; Notable for the following:    Salicylate Lvl <2.0 (*)    All other components within normal limits  URINE RAPID DRUG SCREEN (HOSP PERFORMED) - Abnormal; Notable for the following:    Cocaine POSITIVE (*)    Tetrahydrocannabinol POSITIVE (*)    All other components within normal limits  ACETAMINOPHEN LEVEL  ETHANOL   No results found.  No diagnosis found.  MDM   Labs as above.  Pt medically cleared and moved to psych ED.  CIWA protocol, temp holding orders, and home meds placed.  VS stable.  I personally performed the services described in this documentation, which was scribed in my presence. The recorded information has been reviewed and is accurate.    Garlon Hatchet, PA-C 07/16/13 [phone]
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
## 2013-07-15 NOTE — ED Notes (Signed)
Pt states she's here for rehab from crack and ETOH, last time had both was yesterday.

## 2013-07-16 ENCOUNTER — Encounter (HOSPITAL_COMMUNITY): Payer: Self-pay | Admitting: *Deleted

## 2013-07-16 ENCOUNTER — Inpatient Hospital Stay (HOSPITAL_COMMUNITY)
Admission: AD | Admit: 2013-07-16 | Discharge: 2013-07-24 | DRG: 897 | Disposition: A | Discharge status: Home / Self Care | Payer: Medicare Other | Source: Intra-hospital | Attending: Psychiatry | Admitting: Psychiatry

## 2013-07-16 DIAGNOSIS — Z79899 Other long term (current) drug therapy: Secondary | ICD-10-CM

## 2013-07-16 DIAGNOSIS — K7030 Alcoholic cirrhosis of liver without ascites: Secondary | ICD-10-CM

## 2013-07-16 DIAGNOSIS — K228 Other specified diseases of esophagus: Secondary | ICD-10-CM | POA: Diagnosis present

## 2013-07-16 DIAGNOSIS — F10230 Alcohol dependence with withdrawal, uncomplicated: Secondary | ICD-10-CM

## 2013-07-16 DIAGNOSIS — F1020 Alcohol dependence, uncomplicated: Secondary | ICD-10-CM | POA: Diagnosis present

## 2013-07-16 DIAGNOSIS — K709 Alcoholic liver disease, unspecified: Secondary | ICD-10-CM

## 2013-07-16 DIAGNOSIS — F319 Bipolar disorder, unspecified: Secondary | ICD-10-CM | POA: Diagnosis present

## 2013-07-16 DIAGNOSIS — K2289 Other specified disease of esophagus: Secondary | ICD-10-CM | POA: Diagnosis present

## 2013-07-16 DIAGNOSIS — F1910 Other psychoactive substance abuse, uncomplicated: Secondary | ICD-10-CM | POA: Diagnosis present

## 2013-07-16 DIAGNOSIS — E039 Hypothyroidism, unspecified: Secondary | ICD-10-CM | POA: Diagnosis present

## 2013-07-16 DIAGNOSIS — F1420 Cocaine dependence, uncomplicated: Secondary | ICD-10-CM

## 2013-07-16 DIAGNOSIS — F1994 Other psychoactive substance use, unspecified with psychoactive substance-induced mood disorder: Secondary | ICD-10-CM

## 2013-07-16 DIAGNOSIS — I8500 Esophageal varices without bleeding: Secondary | ICD-10-CM | POA: Diagnosis present

## 2013-07-16 DIAGNOSIS — K2100 Gastro-esophageal reflux disease with esophagitis, without bleeding: Secondary | ICD-10-CM | POA: Diagnosis present

## 2013-07-16 DIAGNOSIS — F10930 Alcohol use, unspecified with withdrawal, uncomplicated: Secondary | ICD-10-CM

## 2013-07-16 MED ORDER — CHLORDIAZEPOXIDE HCL 25 MG PO CAPS
25.0000 mg | ORAL_CAPSULE | Freq: Three times a day (TID) | ORAL | Status: AC
  Administered 2013-07-17 – 2013-07-18 (×3): 25 mg via ORAL
  Filled 2013-07-16 (×3): qty 1

## 2013-07-16 MED ORDER — ONDANSETRON 4 MG PO TBDP
4.0000 mg | ORAL_TABLET | Freq: Four times a day (QID) | ORAL | Status: AC | PRN
  Administered 2013-07-16 – 2013-07-19 (×4): 4 mg via ORAL
  Filled 2013-07-16 (×2): qty 1

## 2013-07-16 MED ORDER — VITAMIN B-1 100 MG PO TABS
100.0000 mg | ORAL_TABLET | Freq: Every day | ORAL | Status: DC
  Administered 2013-07-17 – 2013-07-24 (×8): 100 mg via ORAL
  Filled 2013-07-16 (×10): qty 1

## 2013-07-16 MED ORDER — FOLIC ACID 1 MG PO TABS
1.0000 mg | ORAL_TABLET | Freq: Every day | ORAL | Status: DC
  Administered 2013-07-16: 1 mg via ORAL

## 2013-07-16 MED ORDER — ACETAMINOPHEN 325 MG PO TABS: 650.0000 mg | ORAL_TABLET | Freq: Four times a day (QID) | ORAL | Status: DC | PRN

## 2013-07-16 MED ORDER — QUETIAPINE FUMARATE 50 MG PO TABS
50.0000 mg | ORAL_TABLET | Freq: Every evening | ORAL | Status: DC | PRN
  Administered 2013-07-16 (×2): 50 mg via ORAL
  Filled 2013-07-16 (×8): qty 1

## 2013-07-16 MED ORDER — MAGNESIUM HYDROXIDE 400 MG/5ML PO SUSP: 30.0000 mL | Freq: Every day | ORAL | Status: DC | PRN

## 2013-07-16 MED ORDER — TRAZODONE HCL 50 MG PO TABS
50.0000 mg | ORAL_TABLET | Freq: Every evening | ORAL | Status: DC | PRN
  Filled 2013-07-16 (×2): qty 1

## 2013-07-16 MED ORDER — CHLORDIAZEPOXIDE HCL 25 MG PO CAPS
25.0000 mg | ORAL_CAPSULE | ORAL | Status: AC
  Administered 2013-07-18 – 2013-07-19 (×2): 25 mg via ORAL
  Filled 2013-07-16 (×2): qty 1

## 2013-07-16 MED ORDER — LOPERAMIDE HCL 2 MG PO CAPS: 2.0000 mg | ORAL_CAPSULE | ORAL | Status: AC | PRN

## 2013-07-16 MED ORDER — ADULT MULTIVITAMIN W/MINERALS CH: 1.0000 | ORAL_TABLET | Freq: Every day | ORAL | Status: DC

## 2013-07-16 MED ORDER — ALUM & MAG HYDROXIDE-SIMETH 200-200-20 MG/5ML PO SUSP
30.0000 mL | ORAL | Status: DC | PRN
  Administered 2013-07-20: 30 mL via ORAL

## 2013-07-16 MED ORDER — CHLORDIAZEPOXIDE HCL 25 MG PO CAPS
25.0000 mg | ORAL_CAPSULE | Freq: Every day | ORAL | Status: AC
  Administered 2013-07-20: 25 mg via ORAL
  Filled 2013-07-16: qty 1

## 2013-07-16 MED ORDER — THIAMINE HCL 100 MG/ML IJ SOLN
100.0000 mg | Freq: Once | INTRAMUSCULAR | Status: DC
  Administered 2013-07-16: 100 mg via INTRAMUSCULAR

## 2013-07-16 MED ORDER — CHLORDIAZEPOXIDE HCL 25 MG PO CAPS
25.0000 mg | ORAL_CAPSULE | Freq: Four times a day (QID) | ORAL | Status: AC
  Administered 2013-07-16 – 2013-07-17 (×4): 25 mg via ORAL
  Filled 2013-07-16 (×4): qty 1

## 2013-07-16 MED ORDER — ADULT MULTIVITAMIN W/MINERALS CH
1.0000 | ORAL_TABLET | Freq: Every day | ORAL | Status: DC
  Administered 2013-07-17 – 2013-07-24 (×8): 1 via ORAL
  Filled 2013-07-16 (×6): qty 1
  Filled 2013-07-16: qty 3
  Filled 2013-07-16 (×3): qty 1

## 2013-07-16 MED ORDER — DULOXETINE HCL 60 MG PO CPEP
60.0000 mg | ORAL_CAPSULE | Freq: Two times a day (BID) | ORAL | Status: DC
Start: 2013-07-16 — End: 2013-07-16
  Administered 2013-07-16: 60 mg via ORAL
  Filled 2013-07-16 (×3): qty 1

## 2013-07-16 MED ORDER — HYDROXYZINE HCL 25 MG PO TABS
25.0000 mg | ORAL_TABLET | Freq: Four times a day (QID) | ORAL | Status: AC | PRN
  Administered 2013-07-16 – 2013-07-19 (×5): 25 mg via ORAL
  Filled 2013-07-16 (×3): qty 1

## 2013-07-16 MED ORDER — ALBUTEROL SULFATE HFA 108 (90 BASE) MCG/ACT IN AERS: 1.0000 | INHALATION_SPRAY | Freq: Four times a day (QID) | RESPIRATORY_TRACT | Status: DC | PRN

## 2013-07-16 MED ORDER — BUPROPION HCL ER (SR) 150 MG PO TB12
150.0000 mg | ORAL_TABLET | Freq: Every day | ORAL | Status: DC
  Administered 2013-07-16: 150 mg via ORAL
  Filled 2013-07-16: qty 1

## 2013-07-16 MED ORDER — VITAMIN B-1 100 MG PO TABS
100.0000 mg | ORAL_TABLET | Freq: Every day | ORAL | Status: DC
  Administered 2013-07-16: 100 mg via ORAL
  Filled 2013-07-16: qty 1

## 2013-07-16 MED ORDER — FUROSEMIDE 20 MG PO TABS
20.0000 mg | ORAL_TABLET | Freq: Every day | ORAL | Status: DC
Start: 2013-07-16 — End: 2013-07-16
  Administered 2013-07-16: 20 mg via ORAL
  Filled 2013-07-16: qty 1

## 2013-07-16 MED ORDER — CHLORDIAZEPOXIDE HCL 25 MG PO CAPS
25.0000 mg | ORAL_CAPSULE | Freq: Four times a day (QID) | ORAL | Status: AC | PRN
  Administered 2013-07-16 – 2013-07-19 (×4): 25 mg via ORAL
  Filled 2013-07-16 (×4): qty 1

## 2013-07-16 MED ORDER — GABAPENTIN 400 MG PO CAPS
800.0000 mg | ORAL_CAPSULE | Freq: Three times a day (TID) | ORAL | Status: DC
  Administered 2013-07-16: 800 mg via ORAL
  Filled 2013-07-16 (×3): qty 2

## 2013-07-16 NOTE — ED Notes (Signed)
Report called to Killian, Charity fundraiser at [facility].  BH ready for pt. At 1445.

## 2013-07-16 NOTE — ED Notes (Signed)
Pt. Took only 1 multivitamin at [phone]

## 2013-07-16 NOTE — Progress Notes (Signed)
D: Patient in her room on approach.  Patient states she is detoxing hard from alcohol.  Patient states she has had problems with drugs for some time.  Patient states she is ready to get clean. Patient states she recently broke up with a boyfriend that she was using crack heavily with.  Patient states she she is having difficulty sleeping for the past few nights. A: Staff to monitor Q 15 mins for safety.  Encouragement and support offered.  Scheduled medications administered per orders.  Vistaril administered prn for anxiety. R: Patient remains safe on the unit.  Patient attended group tonight.  Patient isolates to her room and only came out for medications tonight.  Patient taking administered medications.

## 2013-07-16 NOTE — Tx Team (Signed)
Initial Interdisciplinary Treatment Plan  PATIENT STRENGTHS: (choose at least two) Ability for insight Average or above average intelligence General fund of knowledge  PATIENT STRESSORS: Medication change or noncompliance Substance abuse   PROBLEM LIST: Problem List/Patient Goals Date to be addressed Date deferred Reason deferred Estimated date of resolution  Polysubstance Abuse 07/16/13     Depression 07/16/13                                                DISCHARGE CRITERIA:  Ability to meet basic life and health needs Improved stabilization in mood, thinking, and/or behavior Withdrawal symptoms are absent or subacute and managed without 24-hour nursing intervention  PRELIMINARY DISCHARGE PLAN: Attend aftercare/continuing care group Return to previous living arrangement  PATIENT/FAMIILY INVOLVEMENT: This treatment plan has been presented to and reviewed with the patient, Erin [name], and/or family member, .  The patient and family have been given the opportunity to ask questions and make suggestions.  Margorie Renner, [place] 07/16/2013, 6:52 PM

## 2013-07-16 NOTE — Consult Note (Signed)
Reason for Consult:Eval for IP psychiatric mgmt and alcohol detox Referring Physician: Dan Humphreys MD  Erin [name] is an 47 y.o. female.  HPI: Pt is a 47 y/o WF with long standing hx of polysubstance abuse, seeking alcohol detox at this time. Patient is consuming four 40 oz beers/daily and 100-120 dollars of crack daily. Patient endorses hx of DT's and S/z  With prior alcohol cessation. Patient is endorsing depressive sx associated with her chronic abuse but denies any SI/SA/AVH or Hi at this time. The patient states that she cannot contract for safety at present.  Past Medical History  Diagnosis Date  . Hypothyroidism   . Blood transfusion July 2012  . Depression   . Anxiety   . Bipolar 1 disorder   . Menorrhagia   . Liver failure   . Bronchitis   . Cirrhosis of liver     Past Surgical History  Procedure Laterality Date  . Tubal ligation    . Laparoscopy    . Orthopedic surgery    . Cholecystectomy    . Fracture surgery    . Hernia repair    . Vaginal hysterectomy  07/27/2011    Procedure: HYSTERECTOMY VAGINAL;  Surgeon: Scheryl Darter, MD;  Location: [facility];  Service: Gynecology;  Laterality: N/A;  Vaginal Hysterectomy with Right Oophorectomy  . Laparotomy  07/28/2011    Procedure: EXPLORATORY LAPAROTOMY;  Surgeon: Tilda Burrow, MD;  Location: [facility];  Service: Gynecology;  Laterality: N/A;    Family History  Problem Relation Age of Onset  . Diabetes Mother   . Hypertension Mother   . Diabetes Daughter   . Hypertension Daughter     Social History:  reports that she has been smoking.  She has never used smokeless tobacco. She reports that  drinks alcohol. She reports that she does not use illicit drugs.  Allergies:  Allergies  Allergen Reactions  . Fluoxetine Hcl Other (See Comments)     hyperactivity  . Metoclopramide Hcl Other (See Comments)    depression  . Morphine And Related Other (See Comments)    High doses cause hallucinations    Medications: I have  reviewed the patient's current medications.  Results for orders placed during the hospital encounter of 07/15/13 (from the past 48 hour(s))  URINE RAPID DRUG SCREEN (HOSP PERFORMED)     Status: Abnormal   Collection Time    07/15/13  6:03 PM      Result Value Range   Opiates NONE DETECTED  NONE DETECTED   Cocaine POSITIVE (*) NONE DETECTED   Benzodiazepines NONE DETECTED  NONE DETECTED   Amphetamines NONE DETECTED  NONE DETECTED   Tetrahydrocannabinol POSITIVE (*) NONE DETECTED   Barbiturates NONE DETECTED  NONE DETECTED   Comment:            DRUG SCREEN FOR MEDICAL PURPOSES     ONLY.  IF CONFIRMATION IS NEEDED     FOR ANY PURPOSE, NOTIFY LAB     WITHIN 5 DAYS.                LOWEST DETECTABLE LIMITS     FOR URINE DRUG SCREEN     Drug Class       Cutoff (ng/mL)     Amphetamine      1000     Barbiturate      200     Benzodiazepine   200     Tricyclics       300  Opiates          300     Cocaine          300     THC              50  ACETAMINOPHEN LEVEL     Status: None   Collection Time    07/15/13  6:14 PM      Result Value Range   Acetaminophen (Tylenol), Serum <15.0  10 - 30 ug/mL   Comment:            THERAPEUTIC CONCENTRATIONS VARY     SIGNIFICANTLY. A RANGE OF 10-30     ug/mL MAY BE AN EFFECTIVE     CONCENTRATION FOR MANY PATIENTS.     HOWEVER, SOME ARE BEST TREATED     AT CONCENTRATIONS OUTSIDE THIS     RANGE.     ACETAMINOPHEN CONCENTRATIONS     >150 ug/mL AT 4 HOURS AFTER     INGESTION AND >50 ug/mL AT 12     HOURS AFTER INGESTION ARE     OFTEN ASSOCIATED WITH TOXIC     REACTIONS.  CBC     Status: Abnormal   Collection Time    07/15/13  6:14 PM      Result Value Range   WBC 1.5 (*) 4.0 - 10.5 K/uL   RBC 3.81 (*) 3.87 - 5.11 MIL/uL   Hemoglobin 12.3  12.0 - 15.0 g/dL   HCT 14.7  82.9 - 56.2 %   MCV 94.5  78.0 - 100.0 fL   MCH 32.3  26.0 - 34.0 pg   MCHC 34.2  30.0 - 36.0 g/dL   RDW 13.0  86.5 - 78.4 %   Platelets 70 (*) 150 - 400 K/uL   Comment:  REPEATED TO VERIFY     SPECIMEN CHECKED FOR CLOTS     PLATELET COUNT CONFIRMED BY SMEAR     CONSISTENT WITH PREVIOUS RESULT  COMPREHENSIVE METABOLIC PANEL     Status: Abnormal   Collection Time    07/15/13  6:14 PM      Result Value Range   Sodium 137  135 - 145 mEq/L   Potassium 4.0  3.5 - 5.1 mEq/L   Chloride 102  96 - 112 mEq/L   CO2 24  19 - 32 mEq/L   Glucose, Bld 135 (*) 70 - 99 mg/dL   BUN 7  6 - 23 mg/dL   Creatinine, Ser 6.96  0.50 - 1.10 mg/dL   Calcium 9.2  8.4 - 29.5 mg/dL   Total Protein 6.7  6.0 - 8.3 g/dL   Albumin 3.3 (*) 3.5 - 5.2 g/dL   AST 41 (*) 0 - 37 U/L   ALT 21  0 - 35 U/L   Alkaline Phosphatase 103  39 - 117 U/L   Total Bilirubin 1.4 (*) 0.3 - 1.2 mg/dL   GFR calc non Af Amer >90  >90 mL/min   GFR calc Af Amer >90  >90 mL/min   Comment:            The eGFR has been calculated     using the CKD EPI equation.     This calculation has not been     validated in all clinical     situations.     eGFR's persistently     <90 mL/min signify     possible Chronic Kidney Disease.  ETHANOL     Status: None   Collection Time  07/15/13  6:14 PM      Result Value Range   Alcohol, Ethyl (B) <11  0 - 11 mg/dL   Comment:            LOWEST DETECTABLE LIMIT FOR     SERUM ALCOHOL IS 11 mg/dL     FOR MEDICAL PURPOSES ONLY  SALICYLATE LEVEL     Status: Abnormal   Collection Time    07/15/13  6:14 PM      Result Value Range   Salicylate Lvl <2.0 (*) 2.8 - 20.0 mg/dL    No results found.  Review of Systems  Neurological: Positive for weakness.  Psychiatric/Behavioral: Positive for depression and substance abuse. Negative for suicidal ideas, hallucinations and memory loss. The patient is nervous/anxious and has insomnia.        Patient seeking alcohol detox consumes four 40 oz beers daily and endorses related substance abuse mood d/o, but denies SI/HI or AVH. Patient also using crack at 120.00 daily  All other systems reviewed and are negative.   Blood  pressure 123/77, pulse 83, temperature 97.4 F (36.3 C), temperature source Oral, resp. rate 16, height 5\' 7"  (1.702 m), weight 108.863 kg (240 lb), SpO2 95.00%. Physical Exam  Nursing note and vitals reviewed. Constitutional:  Obese, disheveled with poor hygiene  HENT:  Head: Normocephalic.  Eyes: Pupils are equal, round, and reactive to light.  Sclera slightly icteric  Neurological:  somnolent but A & O x 3  Skin: Skin is warm and dry.  Psychiatric:  Limited insight with  slow speech and thought process, over all sad affect    Assessment/Plan: 1) Admit to [address] [facility] for crises mgmt, safety and stabilization of polysubstance/drug induced mood d/o and alcohol detox 2) Social work to aid in OP support services and psychiatric mgmt 3) Mgmt of co-morbid conditions 4) Administration of psychotropics and psychotherapeutic interventions  Jeptha Hinnenkamp E 07/16/2013, 3:08 AM

## 2013-07-16 NOTE — Progress Notes (Signed)
47 year old female pt admitted on voluntary basis. Pt reports that she got on the bus and came to the hospital to get detoxed off crack and alcohol. Pt reports being on a 2 and a half month binge and has not been taking medications as prescribed. Pt does endorse depression but denies any SI on admission and able to contract for safety on the unit. Pt states that [facility] community and will go back there at discharge. Pt states on admission that she is here to get detoxed, back on her medications and will go back to her apartment. Pt was oriented to the unit and safety maintained.

## 2013-07-16 NOTE — BHH Counselor (Signed)
Patient accepted to [facility] by [facility], NP/Dr. Lolly Mustache. The attending psychiatrist is Dr. Jannifer Franklin. The room number is 306-1. Nursing report # is [phone].

## 2013-07-16 NOTE — ED Provider Notes (Signed)
Medical screening examination/treatment/procedure(s) were performed by non-physician practitioner and as supervising physician I was immediately available for consultation/collaboration.   Ashby Dawes, MD 07/16/13 1011

## 2013-07-17 ENCOUNTER — Encounter (HOSPITAL_COMMUNITY): Payer: Self-pay | Admitting: Psychiatry

## 2013-07-17 DIAGNOSIS — F1020 Alcohol dependence, uncomplicated: Secondary | ICD-10-CM | POA: Diagnosis present

## 2013-07-17 LAB — T3, FREE: T3, Free: 2.3 pg/mL (ref 2.3–4.2)

## 2013-07-17 LAB — T4, FREE: Free T4: 1 ng/dL (ref 0.80–1.80)

## 2013-07-17 LAB — TSH: TSH: 2.115 u[IU]/mL (ref 0.350–4.500)

## 2013-07-17 MED ORDER — CYANOCOBALAMIN 1000 MCG/ML IJ SOLN
1000.0000 ug | Freq: Once | INTRAMUSCULAR | Status: AC
  Administered 2013-07-17: 1000 ug via INTRAMUSCULAR

## 2013-07-17 MED ORDER — NAPROXEN 500 MG PO TABS
500.0000 mg | ORAL_TABLET | Freq: Two times a day (BID) | ORAL | Status: DC
  Administered 2013-07-17 – 2013-07-24 (×14): 500 mg via ORAL
  Filled 2013-07-17 (×19): qty 1

## 2013-07-17 MED ORDER — PANTOPRAZOLE SODIUM 40 MG PO TBEC
40.0000 mg | DELAYED_RELEASE_TABLET | Freq: Every day | ORAL | Status: DC
  Administered 2013-07-17 – 2013-07-24 (×8): 40 mg via ORAL
  Filled 2013-07-17 (×11): qty 1

## 2013-07-17 MED ORDER — QUETIAPINE FUMARATE 50 MG PO TABS
50.0000 mg | ORAL_TABLET | Freq: Two times a day (BID) | ORAL | Status: DC
  Administered 2013-07-17 – 2013-07-19 (×4): 50 mg via ORAL
  Filled 2013-07-17 (×8): qty 1

## 2013-07-17 MED ORDER — IBUPROFEN 800 MG PO TABS
800.0000 mg | ORAL_TABLET | Freq: Three times a day (TID) | ORAL | Status: DC | PRN
  Filled 2013-07-17: qty 1

## 2013-07-17 MED ORDER — DULOXETINE HCL 30 MG PO CPEP
30.0000 mg | ORAL_CAPSULE | Freq: Every day | ORAL | Status: DC
  Administered 2013-07-17 – 2013-07-24 (×8): 30 mg via ORAL
  Filled 2013-07-17 (×7): qty 1
  Filled 2013-07-17: qty 3
  Filled 2013-07-17 (×3): qty 1

## 2013-07-17 MED ORDER — IBUPROFEN 600 MG PO TABS: 600.0000 mg | ORAL_TABLET | Freq: Four times a day (QID) | ORAL | Status: DC | PRN

## 2013-07-17 MED ORDER — FLEET ENEMA 7-19 GM/118ML RE ENEM
1.0000 | ENEMA | Freq: Once | RECTAL | Status: AC
  Administered 2013-07-17: 1 via RECTAL
  Filled 2013-07-17 (×2): qty 1

## 2013-07-17 MED ORDER — GABAPENTIN 300 MG PO CAPS
300.0000 mg | ORAL_CAPSULE | Freq: Three times a day (TID) | ORAL | Status: DC
  Administered 2013-07-17 – 2013-07-24 (×22): 300 mg via ORAL
  Filled 2013-07-17 (×8): qty 1
  Filled 2013-07-17 (×2): qty 9
  Filled 2013-07-17 (×17): qty 1
  Filled 2013-07-17: qty 9

## 2013-07-17 MED ORDER — QUETIAPINE FUMARATE 100 MG PO TABS
100.0000 mg | ORAL_TABLET | Freq: Every day | ORAL | Status: DC
  Administered 2013-07-17: 100 mg via ORAL
  Filled 2013-07-17 (×3): qty 1

## 2013-07-17 MED ORDER — QUETIAPINE FUMARATE 50 MG PO TABS
50.0000 mg | ORAL_TABLET | Freq: Two times a day (BID) | ORAL | Status: DC
  Filled 2013-07-17 (×2): qty 1

## 2013-07-17 MED ORDER — FOLIC ACID 1 MG PO TABS
1.0000 mg | ORAL_TABLET | Freq: Every day | ORAL | Status: DC
  Administered 2013-07-17 – 2013-07-24 (×8): 1 mg via ORAL
  Filled 2013-07-17 (×6): qty 1
  Filled 2013-07-17: qty 3
  Filled 2013-07-17 (×3): qty 1

## 2013-07-17 NOTE — Progress Notes (Signed)
Nutrition Brief Note  Patient identified on the Malnutrition Screening Tool (MST) Report  Body mass index is 38.21 kg/(m^2). Patient meets criteria for Obesity grade II based on current BMI.  Ht: 5'7'' Wt: 244 lbs UBW: 250 lbs  Pt reports that she had lost weight before being admitted to [facility], but that she is hungry and is planning on eating lunch. She reports her usual body weight at 250 lbs.  Current diet order is regular, patient is consuming approximately. Labs and medications reviewed.   No nutrition interventions warranted at this time. If nutrition issues arise, please consult RD.   Ebbie Latus RD, LDN

## 2013-07-17 NOTE — BHH Group Notes (Signed)
  [facility] LCSW Group Therapy  07/17/2013  1:15 PM   Type of Therapy:  Group Therapy  Participation Level:  Did Not Attend  Reyes Ivan, LCSWA 07/17/2013 1:52 PM
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
## 2013-07-17 NOTE — BHH Suicide Risk Assessment (Signed)
BHH INPATIENT: Family/Significant Other Suicide Prevention Education  Suicide Prevention Education:  Education Completed; No one has been identified by the patient as the family member/significant other with whom the patient will be residing, and identified as the person(s) who will aid the patient in the event of a mental health crisis (suicidal ideations/suicide attempt). With written consent from the patient, the family member/significant other has been provided the following suicide prevention education, prior to the and/or following the discharge of the patient.  The suicide prevention education provided includes the following:  Suicide risk factors  Suicide prevention and interventions  National Suicide Hotline telephone number  [facility] assessment telephone number  [facility] Emergency Assistance 911  [facility] and/or Residential Mobile Crisis Unit telephone number Request made of family/significant other to:  Remove weapons (e.g., guns, rifles, knives), all items previously/currently identified as safety concern.  Remove drugs/medications (over-the-counter, prescriptions, illicit drugs), all items previously/currently identified as a safety concern. The family member/significant other verbalizes understanding of the suicide prevention education information provided. The family member/significant other agrees to remove the items of safety concern listed above. Pt did not c/o SI at admission, nor have they endorsed SI during their stay here. SPE not required.  Reyes Ivan, LCSWA 07/17/2013  11:59 AM

## 2013-07-17 NOTE — BHH Group Notes (Signed)
[facility] LCSW Aftercare Discharge Planning Group Note   07/17/2013 8:45 AM  Participation Quality:  Did Not Attend   Reyes Ivan, LCSWA 07/17/2013 9:50 AM

## 2013-07-17 NOTE — Tx Team (Signed)
  Interdisciplinary Treatment Plan Update (Adult)  Date: 07/17/2013  Time Reviewed:  9:45 AM  Progress in Treatment: Attending groups: Yes Participating in groups:  Yes Taking medication as prescribed:  Yes Tolerating medication:  Yes Family/Significant othe contact made: CSW assessing Patient understands diagnosis:  Yes Discussing patient identified problems/goals with staff:  Yes Medical problems stabilized or resolved:  Yes Denies suicidal/homicidal ideation: Yes Issues/concerns per patient self-inventory:  Yes Other:  New problem(s) identified: N/A  Discharge Plan or Barriers: CSW assessing for appropriate referrals.    Reason for Continuation of Hospitalization: Anxiety Depression Medication Stabilization  Comments: N/A  Estimated length of stay: 3-5 days  For review of initial/current patient goals, please see plan of care.  Attendees: Patient:     Family:     Physician:  Dr. Dub Mikes 07/17/2013 9:44 AM   Nursing:   Roswell Miners, RN 07/17/2013 9:44 AM   Clinical Social Worker:  Reyes Ivan, LCSWA 07/17/2013 9:44 AM   Other: Robbie Louis, RN 07/17/2013 9:44 AM   Other:  Trula Slade, LCSWA 07/17/2013 9:44 AM   Other:  Burnetta Sabin, RN 07/17/2013 9:47 AM   Other:  Serena Colonel, NP 07/17/2013 9:48 AM   Other: Tomasita Morrow, care coordination 07/17/2013 9:48 AM   Other:    Other:    Other:    Other:    Other:     Scribe for Treatment Team:   Carmina Miller, 07/17/2013 , 9:44 AM
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
## 2013-07-17 NOTE — Progress Notes (Signed)
Patient ID: Erin [name], female   DOB: January 09, 1966, 47 y.o.   MRN: [id number] Pt did not attend the [facility]ational group.

## 2013-07-17 NOTE — Progress Notes (Signed)
Psychoeducational Group Note  Date:  07/17/2013 Time:  2000  Group Topic/Focus:  NA group  Participation Level: Did Not Attend  Participation Quality:  Not Applicable  Affect:  Not Applicable  Cognitive:  Not Applicable  Insight:  Not Applicable  Engagement in Group: Not Applicable  Additional Comments:    Flonnie Hailstone 07/17/2013, 9:29 PM

## 2013-07-17 NOTE — [facility] Suicide Risk Assessment (Signed)
Suicide Risk Assessment  Admission Assessment     Nursing information obtained from:    Demographic factors:    Current Mental Status:    Loss Factors:    Historical Factors:    Risk Reduction Factors:     CLINICAL FACTORS:   Bipolar Disorder:   Depressive phase Alcohol/Substance Abuse/Dependencies  COGNITIVE FEATURES THAT CONTRIBUTE TO RISK:  Closed-mindedness Polarized thinking Thought constriction (tunnel vision)    SUICIDE RISK:   Moderate:  Frequent suicidal ideation with limited intensity, and duration, some specificity in terms of plans, no associated intent, good self-control, limited dysphoria/symptomatology, some risk factors present, and identifiable protective factors, including available and accessible social support.  PLAN OF CARE: Supportive approach/coping skills/relapse prevention                              [facility] Detox protocol                               Reassess and address the co morbidites  I certify that inpatient services furnished can reasonably be expected to improve the patient's condition.  Kerry Chisolm A 07/17/2013, 3:52 PM

## 2013-07-17 NOTE — Progress Notes (Signed)
D: Patient in bed on first approach.  Patient appears more awake today.  Patient states she is having a hard time.  Patient states she has multiple medical problems and patient states she wants to get better.  Patient denies SI/HI and denies AVH. A: Staff to monitor Q 15 mins for safety.  Encouragement and support offered.  Scheduled medications administered per orders.  Vistaril administered prn for anxiety and withdrawals R: Patient remains safe on the unit.  Patient did not attend group tonight.  Patient visible on the unit during snack time and interacting with peers.  Patient cooperative and taking administered medications.

## 2013-07-18 MED ORDER — QUETIAPINE FUMARATE 200 MG PO TABS
200.0000 mg | ORAL_TABLET | Freq: Every day | ORAL | Status: DC
  Administered 2013-07-18 – 2013-07-20 (×3): 200 mg via ORAL
  Filled 2013-07-18 (×6): qty 1
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
## 2013-07-18 NOTE — Progress Notes (Signed)
Patient did not attend the evening karaoke group. Pt remained in bed during group.  
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
## 2013-07-18 NOTE — Progress Notes (Signed)
Recreation Therapy Notes  Date: 07.24.2014 Time: 3:00pm Location: [address] Dayroom  Group Topic: Leisure Education  Goal Area(s) Addresses:  Patient will have increased knowledge of community resources. Patient will verbalize the ability to use positive leisure/recreation as a coping mechanism. Patient will verbalized a benefit of implementing positive leisure/recreation into daily life.   Behavioral Response: Appropriate, Sharing, Disengaged in activity  Intervention: Art, Self-Expression  Activity: Match box of Leisure. Patients were given small white boxes similar in construction to [employer]. Patients were instructed to decorate the outside to represent themselves and used magazine clippings to represent three activities of choice that can be used as coping mechanisms. Patients were given boxes, magazines, glue, scissors, color pencils and crayons to complete activity.   Education:  Discharge Planning, Pharmacologist, Leisure Education   Education Outcome: Needs Additional Education  Clinical Observations: Patient chose not to participate in activity, stating she felt to ill to participate. Patient verbalized knowledge of community resources such as the [employer], parks, Museum/gallery exhibitions officer, and [employer] Patient verbalized she has an understanding of role of individual organization. Patient shared that she enjoys playing pool and drinking beer and whiskey while doing it. Patient shared that she spends a lot of time drinking with her homeless friends. Patient stated the benefit to this is that they can all throw in on a bottle or two of liquor. Patient shared that she enjoys painting and making wind chimes. Patient was able to identify that she feels calm when she paints. LRT educated patient on using painting as a coping mechanism. Patient listened to LRT, however LRT is unsure if patient absorbed what was said.   Marykay Lex Audon Heymann, LRT/CTRS  Jearl Klinefelter 07/18/2013 4:59 PM

## 2013-07-19 DIAGNOSIS — F1020 Alcohol dependence, uncomplicated: Principal | ICD-10-CM

## 2013-07-19 DIAGNOSIS — F319 Bipolar disorder, unspecified: Secondary | ICD-10-CM

## 2013-07-19 DIAGNOSIS — F1420 Cocaine dependence, uncomplicated: Secondary | ICD-10-CM

## 2013-07-19 MED ORDER — QUETIAPINE FUMARATE 50 MG PO TABS
75.0000 mg | ORAL_TABLET | Freq: Two times a day (BID) | ORAL | Status: DC
  Administered 2013-07-19 – 2013-07-22 (×6): 75 mg via ORAL
  Filled 2013-07-19 (×11): qty 1

## 2013-07-19 NOTE — Progress Notes (Signed)
D.  Pt upset that her clothes, which she states were new, were locked up because she was told her shorts were too short.  However Pt has now noticed another Pt with shorts shorter than she states hers were.  Pt would like someone to re-assess her clothes because she has been wearing gowns since admission on the 22nd.  Pt denies SI/HI/Hallucinations at this time.  A.  Support and encouragement offered  R.  Pt remains safe on unit, will continue to monitor.

## 2013-07-19 NOTE — [facility] Group Notes (Signed)
[facility] LCSW Aftercare Discharge Planning Group Note   07/19/2013  8:45 AM  Participation Quality:  Alert and Appropriate   Mood/Affect:  Appropriate, Flat, Depressed  Depression Rating:  8  Anxiety Rating:  10  Thoughts of Suicide:  Pt denies SI/HI, stating she doesn't have the energy to have either  Will you contract for safety?   Yes  Current AVH:  No  Plan for Discharge/Comments:  Pt attended discharge planning group and actively participated in group.  CSW provided pt with today's workbook.  Pt reports doing "poorly" today.  Pt states that she is having a lot of medical issues and pain today.  Pt will return to own home in [place].  Pt will follow up at [facility] for medication management and therapy.  No further needs voiced by pt at this time.    Transportation Means: Pt reports access to transportation  Supports: No supports mentioned at this time  Reyes Ivan, LCSWA 07/19/2013 9:53 AM

## 2013-07-19 NOTE — Progress Notes (Signed)
Nursing Progress Note. D. Patient presents with depressed, anxious mood. She continues to report feeling ''bad'' stating '' I just feel rough, I rested better but my appetite is gone and I'm just dealing with the nerves '' Patient remains on CIWA protocol. Patient denies any suicidal ideation. Has been visible on the unit attending some of the unit programming,although did not attend all groups throughout am programming thus far. Patient states ''I've got a headache I'm just gonna lie down a while''  A. Medications administered, and allowed patient to ventilate, support given. R. Patient currently calm and cooperative with Clinical research associate. Will continue to monitor q 15 minutes for safety.
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
## 2013-07-19 NOTE — Progress Notes (Signed)
D   Pt spent most of the early part of the shift in bed   She did not go to group   She is irritable and isolative   She can be intrusive and was breaking in line at the medication window and was told she would be given her medications when the lady in front of her was finished  She denies suicidal ideation at present A   Verbal support given  Medications administered and effectiveness monitored Q 15 min checks R   Pt safe at present

## 2013-07-19 NOTE — Tx Team (Signed)
Interdisciplinary Treatment Plan Update (Adult)  Date: 07/19/2013  Time Reviewed:  9:45 AM  Progress in Treatment: Attending groups: Yes Participating in groups:  Yes Taking medication as prescribed:  Yes Tolerating medication:  Yes Family/Significant othe contact made: Pt refused Patient understands diagnosis:  Yes Discussing patient identified problems/goals with staff:  Yes Medical problems stabilized or resolved:  Yes Denies suicidal/homicidal ideation: Yes Issues/concerns per patient self-inventory:  Yes Other:  New problem(s) identified: N/A  Discharge Plan or Barriers: Pt has follow up scheduled at [facility] for medication management and therapy.     Reason for Continuation of Hospitalization: Anxiety Depression Medication Stabilization  Comments: N/A  Estimated length of stay: 3 days, d/c possible on Monday  For review of initial/current patient goals, please see plan of care.  Attendees:  Patient:    Family:    Physician: Geoffery Lyons MD 07/19/2013 9:43 AM   Nursing: Sue Lush RN 07/19/2013 9:43 AM   Clinical Social Worker Heather Smart, LCSWA  07/19/2013 9:43 AM   Other: Aggie PA 07/19/2013 9:44 AM  Other: Darden Dates Nurse RN 07/19/2013 9:46 AM   Other: Massie Kluver, Community Care Coordinator  07/19/2013 9:46 AM   Other:      Scribe for Treatment Team:   Carmina Miller, 07/19/2013 , 10:38 AM

## 2013-07-20 LAB — HEMOGLOBIN A1C
Hgb A1c MFr Bld: 4.8 % (ref <5.7)
Mean Plasma Glucose: 91 mg/dL (ref <117)

## 2013-07-20 MED ORDER — MAGNESIUM CITRATE PO SOLN: 1.0000 | Freq: Once | ORAL | Status: DC

## 2013-07-20 MED ORDER — MAGNESIUM CITRATE PO SOLN
1.0000 | Freq: Once | ORAL | Status: AC
  Administered 2013-07-20: 1 via ORAL

## 2013-07-20 NOTE — Progress Notes (Signed)
Adult Psychoeducational Group Note  Date:  07/20/2013 Time:  9:41 AM  Group Topic/Focus:  Relapse Prevention Planning:   The focus of this group is to define relapse and discuss the need for planning to combat relapse.  Participation Level:  Active  Participation Quality:  Intrusive and Monopolizing  Affect:  Defensive and Irritable  Cognitive:  Alert  Insight: Lacking  Engagement in Group:  Monopolizing  Modes of Intervention:  Discussion, Education and Support  Additional Comments:  Pt was monopolizing and distracting throughout group, talking about how "Everything is focused at men." Pt pointed out quote on front of Fridays workbook, stating "Look it says: Nothing can stops the MAN with the right mental attitude from achieving his goal; nothing on earth can help the MAN with wrong mental attitude. Everything is about men! All rehabs are for men, not women." Pt had to be redirected several times; pt brought up subject again later as this Clinical research associate said "You guys" while asking pts for more group participation. Though pt was intrusive during group, pt was able to identify triggers and appeared to understand the importance of making a relapse prevention plan.   Dalia Heading 07/20/2013, 9:41 AM

## 2013-07-20 NOTE — [facility] Group Notes (Signed)
[facility] Group Notes:  (Nursing/MHT/Case Management/Adjunct)  Date:  07/20/2013  Time:  10:21 AM Type of Therapy: patient self inventory review with RN  Participation Level: Active  Participation Quality: Appropriate  Affect: Appropriate  Cognitive: Alert  Insight: Appropriate  Engagement in Group: Engaged  Modes of Intervention: Discussion and Education  Summary of Progress/Problems:  patient self inventory review with RN discussed mood, and healthy coping skills.     Malva Limes 07/20/2013, 10:21 AM

## 2013-07-20 NOTE — Progress Notes (Signed)
Patient did attend the evening speaker AA meeting.  

## 2013-07-20 NOTE — Progress Notes (Signed)
D.  Pt approached stating that she needs something for agitation.  Pt appeared flat and blunted, calm demeanor.  Denies SI/HI/hallucinations at this time. Positive for evening AA group.  Interacting appropriately tonight on unit, can be intrusive at times.  A.  Explained that Pt's scheduled medication, Seroquel 200 mg, should be effective for Pt's stated agitation.  Support and encouragement offered  R.  Will continue to monitor.

## 2013-07-20 NOTE — Progress Notes (Signed)
Adult Psychoeducational Group Note  Date:  07/20/2013 Time:  7:02 PM  Group Topic/Focus:  Healthy Communication:   The focus of this group is to discuss communication, barriers to communication, as well as healthy ways to communicate with others.  Participation Level:  Active  Participation Quality:  Appropriate and Attentive  Affect:  Appropriate  Cognitive:  Appropriate  Insight: Good  Engagement in Group:  Engaged  Modes of Intervention:  Discussion, Education, Socialization and Support  Additional Comments:  Pt shared her thoughts throughout the group. Pt. Talked about her past relationships. Pt. Talked about wanting to communicate better and not become confrontational with others.  Laural Benes, Orlinda Slomski 07/20/2013, 7:02 PM

## 2013-07-20 NOTE — Progress Notes (Signed)
Patient ID: Erin [name], female   DOB: 1966/12/07, 47 y.o.   MRN: [id number] D. Patient presents with depressed and anxious mood, also somewhat irritable stating to RN '' there is another patient here on the ward that has on shorts, and I think they are shorter than the shorts I had when I came in and I don't think it's fair that I'm stuck wearing a gown '' Patient reminded that staff assess all clothing during admission and that staff aware of clothing, and that patient may wash other clothing she has on unit, or make phone call to family to bring items in. Patient also continues to complain of anxiety and feeling depressed rating her mood at 8/10 on depression scale, 10 being worst depression, 1 being none. She continues to report feeling  tiredShe denies any SI/HI/A/V Hallucinations at this time. A. Medications administered, as well as prn medications for anxiety/withdrawal. Patient has attended unit programming, and visible in the milieu, interactive with peers . R. Pt currently cooperative, attending unit programming. Will continue to monitor q 15 minutes for safety.

## 2013-07-20 NOTE — [facility] Group Notes (Signed)
[facility] LCSW Group Therapy  07/20/2013   10:00 AM   Type of Therapy:  Group Therapy  Participation Level:  Active  Participation Quality:  Appropriate and Attentive  Affect:  Appropriate, Flat  Cognitive:  Alert and Appropriate  Insight:  Developing/Improving and Engaged  Engagement in Therapy:  Developing/Improving and Engaged  Modes of Intervention:  Clarification, Confrontation, Discussion, Education, Exploration, Limit-setting, Orientation, Problem-solving, Rapport Building, Dance movement psychotherapist, Socialization and Support  Summary of Progress/Problems: The main focus of today's process group was for the patient to identify ways in which they have in the past sabotaged their own recovery and reasons they may have done this/what they received from doing it. We then worked to identify a specific plan to avoid doing this when discharged from the hospital for this admission.  Pt shared that she self sabotages by allowing herself to be around negative influences and people.  Pt explained that she has low self esteem and will purposely surround herself with people she considers lower than her, to build her self esteem.  In doing this, pt resorts back to using.  Pt was able to identify that screening who she hangs around and staying busy, such as joining the [facility] [employer], could prevent future self sabotaging behaviors.     Reyes Ivan, LCSWA 07/20/2013 9:47 AM

## 2013-07-20 NOTE — Progress Notes (Signed)
Adult Psychoeducational Group Note  Date:  07/20/2013 Time:  9:40 AM  Group Topic/Focus:  Therapeutic Activity  Participation Level:  Did Not Attend   Dalia Heading 07/20/2013, 9:40 AM

## 2013-07-20 NOTE — [facility] Group Notes (Signed)
[facility] Group Notes:  (Nursing/MHT/Case Management/Adjunct) Date:  07/20/2013  Time:  3:22 PM  Type of Therapy:  Psychoeducational Skills  Participation Level:  Active  Participation Quality:  Appropriate and Attentive  Affect:  Appropriate  Cognitive:  Alert  Insight:  Appropriate  Engagement in Group:  Engaged  Modes of Intervention:  Discussion, Exploration and Support  Summary of Progress/Problems: Healthy coping skills educational group with identification of triggers. Malva Limes 07/20/2013, 3:22 PM

## 2013-07-20 NOTE — Progress Notes (Signed)
Patient ID: Erin [name], female   DOB: [birth year], 47 y.o.   MRN: [id number] [facility] MD Progress Note  07/20/2013 4:25 PM Erin [name]  MRN:  [id number] Subjective:  Patient states she is feeling jittery today. She was seen in bed during group time.  Diagnosis:  Cocaine, Alcohol Dependence, Bipolar Disorder  ADL's:  Intact  Sleep: Poor  Appetite:  Fair  Suicidal Ideation:  Plan:  denies Intent:  denies Means:  denies Homicidal Ideation:  Plan:  denies Intent:  denies Means:  denies AEB (as evidenced by):  Psychiatric Specialty Exam: Review of Systems  Constitutional: Positive for malaise/fatigue.  HENT: Negative.   Eyes: Positive for photophobia and pain.  Respiratory: Negative.   Cardiovascular: Negative.   Gastrointestinal: Negative.   Genitourinary: Negative.   Musculoskeletal: Negative.   Skin: Negative.   Neurological: Positive for tremors.  Endo/Heme/Allergies: Negative.   Psychiatric/Behavioral: Positive for depression and substance abuse. The patient is nervous/anxious.     Blood pressure 107/64, pulse 80, temperature 97.4 F (36.3 C), temperature source Oral, resp. rate 16, height 5\' 7"  (1.702 m), weight 110.678 kg (244 lb), SpO2 98.00%.Body mass index is 38.21 kg/(m^2).  General Appearance: Fairly Groomed  Patent attorney::  Fair  Speech:  Clear and Coherent and Slow  Volume:  Decreased  Mood:  Anxious, Depressed, Hopeless and worried  Affect:  Restricted  Thought Process:  Thought blocking  Orientation:  Full (Time, Place, and Person)  Thought Content:  worries, concerns  Suicidal Thoughts:  No  Homicidal Thoughts:  No  Memory:  Immediate;   Fair Recent;   Fair Remote;   Fair  Judgement:  Fair  Insight:  Present  Psychomotor Activity:  Psychomotor Retardation and Restlessness  Concentration:  Fair  Recall:  Fair  Akathisia:  No  Handed:  Right  AIMS (if indicated):     Assets:  Desire for Improvement Housing  Sleep:  Number of Hours: 6.5    Current Medications: Current Facility-Administered Medications  Medication Dose Route Frequency Provider Last Rate Last Dose  . acetaminophen (TYLENOL) tablet 650 mg  650 mg Oral Q6H PRN Kerry Hough, PA-C      . alum & mag hydroxide-simeth (MAALOX/MYLANTA) 200-200-20 MG/5ML suspension 30 mL  30 mL Oral Q4H PRN Kerry Hough, PA-C   30 mL at 07/20/13 0917  . DULoxetine (CYMBALTA) DR capsule 30 mg  30 mg Oral Daily Rachael Fee, MD   30 mg at 07/20/13 0748  . folic acid (FOLVITE) tablet 1 mg  1 mg Oral Daily Rachael Fee, MD   1 mg at 07/20/13 0748  . gabapentin (NEURONTIN) capsule 300 mg  300 mg Oral TID Rachael Fee, MD   300 mg at 07/20/13 1146  . magnesium citrate solution 1 Bottle  1 Bottle Oral Once Verne Spurr, PA-C      . magnesium hydroxide (MILK OF MAGNESIA) suspension 30 mL  30 mL Oral Daily PRN Kerry Hough, PA-C      . multivitamin with minerals tablet 1 tablet  1 tablet Oral Daily Kerry Hough, PA-C   1 tablet at 07/20/13 0747  . naproxen (NAPROSYN) tablet 500 mg  500 mg Oral BID WC Rachael Fee, MD   500 mg at 07/20/13 0747  . pantoprazole (PROTONIX) EC tablet 40 mg  40 mg Oral Daily Rachael Fee, MD   40 mg at 07/20/13 0747  . QUEtiapine (SEROQUEL) tablet 200 mg  200 mg Oral QHS Nicole Kindred  I Nwoko, NP   200 mg at 07/19/13 2106  . QUEtiapine (SEROQUEL) tablet 75 mg  75 mg Oral BID Rachael Fee, MD   75 mg at 07/20/13 0747  . thiamine (VITAMIN B-1) tablet 100 mg  100 mg Oral Daily Kerry Hough, PA-C   100 mg at 07/20/13 0981    Lab Results:  Results for orders placed during the hospital encounter of 07/16/13 (from the past 48 hour(s))  HEMOGLOBIN A1C     Status: None   Collection Time    07/19/13  7:31 PM      Result Value Range   Hemoglobin A1C 4.8  <5.7 %   Comment: (NOTE)                                                                               According to the ADA Clinical Practice Recommendations for 2011, when     HbA1c is used as a screening  test:      >=6.5%   Diagnostic of Diabetes Mellitus               (if abnormal result is confirmed)     5.7-6.4%   Increased risk of developing Diabetes Mellitus     References:Diagnosis and Classification of Diabetes Mellitus,Diabetes     Care,2011,34(Suppl 1):S62-S69 and Standards of Medical Care in             Diabetes - 2011,Diabetes Care,2011,34 (Suppl 1):S11-S61.   Mean Plasma Glucose 91  <117 mg/dL    Physical Findings: AIMS: Facial and Oral Movements Muscles of Facial Expression: None, normal Lips and Perioral Area: None, normal Jaw: None, normal Tongue: None, normal,Extremity Movements Upper (arms, wrists, hands, fingers): None, normal Lower (legs, knees, ankles, toes): None, normal, Trunk Movements Neck, shoulders, hips: None, normal, Overall Severity Severity of abnormal movements (highest score from questions above): None, normal Incapacitation due to abnormal movements: None, normal Patient's awareness of abnormal movements (rate only patient's report): No Awareness, Dental Status Current problems with teeth and/or dentures?: Yes Does patient usually wear dentures?: No  CIWA:  CIWA-Ar Total: 2 COWS:     Treatment Plan Summary: Daily contact with patient to assess and evaluate symptoms and progress in treatment Medication management  Plan: Supportive approach/coping skills/relapse prevention           Continue current medication as planned.            Patient encouraged to attend groups.           Mag Citrate for constipation. Medical Decision Making Problem Points:  Review of psycho-social stressors (1) Data Points:  Review of medication regiment & side effects (2) Review of new medications or change in dosage (2)  I certify that inpatient services furnished can reasonably be expected to improve the patient's condition.  Rona Ravens. Francy Mcilvaine RPAC 4:27 PM 07/20/2013

## 2013-07-20 NOTE — Progress Notes (Signed)
Reviewed the information documented and agree with the treatment plan.  Daylin Gruszka,JANARDHAHA R. 07/20/2013 6:35 PM 

## 2013-07-21 MED ORDER — QUETIAPINE FUMARATE 300 MG PO TABS
300.0000 mg | ORAL_TABLET | Freq: Every day | ORAL | Status: DC
  Administered 2013-07-21 – 2013-07-23 (×3): 300 mg via ORAL
  Filled 2013-07-21: qty 1
  Filled 2013-07-21: qty 3
  Filled 2013-07-21 (×4): qty 1

## 2013-07-21 MED ORDER — FLEET ENEMA 7-19 GM/118ML RE ENEM
1.0000 | ENEMA | Freq: Once | RECTAL | Status: AC
  Administered 2013-07-21: 1 via RECTAL
  Filled 2013-07-21 (×2): qty 1

## 2013-07-21 NOTE — [facility] Group Notes (Signed)
[facility] LCSW Group Therapy  07/21/2013   10:00 AM   Type of Therapy:  Group Therapy  Participation Level:  Active  Participation Quality:  Appropriate and Attentive  Affect:  Appropriate, Flat  Cognitive:  Alert and Appropriate  Insight:  Developing/Improving and Engaged  Engagement in Therapy:  Developing/Improving and Engaged  Modes of Intervention:  Clarification, Confrontation, Discussion, Education, Exploration, Limit-setting, Orientation, Problem-solving, Rapport Building, Dance movement psychotherapist, Socialization and Support  Summary of Progress/Problems: The main focus of today's process group was to identify the patient's current support system and decide on other supports that can be put in place.  An emphasis was placed on using counselor, doctor, therapy groups, 12-step groups, and problem-specific support groups to expand supports, as well as doing something different than has been done before.  Pt shared that everyone in her apartment complex has home care aides, who come into the homes to assist them and take them places.  Pt states that prior to this admission she always turned this service down due to her substance use.  Pt explained that she didn't want people in her home seeing or knowing she was drinking or using drugs.  Pt states that she now wants this service because she feels she will be held accountable this way, has nothing to hide and could use the help.  Pt actively participated and was engaged in group discussion.   Esco Joslyn Horton, LCSWA 07/21/2013 11:00 AM

## 2013-07-21 NOTE — Progress Notes (Signed)
Patient ID: SHELITHA MAGLEY, female   DOB: [birth year], 47 y.o.   MRN: [id number] D. Patient presents with depressed mood, affect blunted. Patient continues to be hyperfocused on peers clothing attempting to split staff to have her locker opened to get clothing already deemed inappropriate. She states to Clinical research associate '' I want to speak to the charge nurse about why that girl has a tank top on and I can't have my other clothes. Patient reminded that staff assess all clothing during admission and that staff aware of clothing, and that patient may wash other clothing she has on unit, or make phone call to family to bring items in.  Patient also continues to complain of anxiety and feeling depressed rating her mood at 8/10 on depression scale, 10 being worst depression, 1 being none. She denies any SI/HI/A/V Hallucinations at this time. Patient reports continued constipation that she did not have results from mag citrate given yesterday.  A.Writer notified provider, orders received. Medications administered, allowed patient to ventilate and support given. Patient has attended unit programming, and visible in the milieu, interactive with peers . R. Pt currently cooperative, attending unit programming. Will continue to monitor q 15 minutes for safety.

## 2013-07-21 NOTE — Progress Notes (Signed)
D.  Flat affect but brightens on approach, states she hasn't been sleeping well and is hopeful that ordered increase in Seroquel will help this tonight.  No other complaints voiced.  Denies SI/HI/hallucinations at this time.  Interacting appropriately on unit, though she can be intrusive at times.  Positive for evening AA group.  A.  Support and encouragement offered  R.  Pt remains safe on unit, will continue to monitor.

## 2013-07-21 NOTE — Progress Notes (Signed)
Adult Psychoeducational Group Note  Date:  07/21/2013 Time:  7:23 PM  Group Topic/Focus:  Emotional Education:   The focus of this group is to discuss what feelings/emotions are, and how they are experienced.  Participation Level:  Active  Participation Quality:  Appropriate  Affect:  Appropriate  Cognitive:  Appropriate  Insight: Appropriate  Engagement in Group:  Engaged and Supportive  Modes of Intervention:  Discussion, Socialization and Support  Elijio Miles 07/21/2013, 7:23 PM

## 2013-07-21 NOTE — Progress Notes (Signed)
Psychoeducational Group Note  Date:  07/21/2013 Time: 1015 Group Topic/Focus:  Making Healthy Choices:   The focus of this group is to help patients identify negative/unhealthy choices they were using prior to admission and identify positive/healthier coping strategies to replace them upon discharge.  Participation Level:  Active  Participation Quality:  Appropriate  Affect:  Depressed  Cognitive:  Appropriate  Insight:  Engaged  Engagement in Group:  Engaged  Additional Comments:    Rich Brave 3:06 PM. 07/21/2013

## 2013-07-21 NOTE — Progress Notes (Signed)
Patient ID: Erin [name], female   DOB: 1966-08-17, 47 y.o.   MRN: [id number] [facility] MD Progress Note  07/21/2013 5:09 PM Erin [name]  MRN:  [id number]  Subjective:  Erin [name] states she is still craving cocaine, while fighting depression. She says she feels wired up, and unable to sleep at night. She feels like leaving to go use. She adds that she is still going through the withdrawal symptoms; headaches, high anxiety, irritation and tremors. Denies SIHI.  Diagnosis:  Cocaine, Alcohol Dependence, Bipolar Disorder  ADL's:  Intact  Sleep: Poor  Appetite:  Fair  Suicidal Ideation:  Plan:  denies Intent:  denies Means:  denies Homicidal Ideation:  Plan:  denies Intent:  denies Means:  denies AEB (as evidenced by):  Psychiatric Specialty Exam: Review of Systems  Constitutional: Positive for malaise/fatigue.  HENT: Negative.   Eyes: Positive for photophobia and pain.  Respiratory: Negative.   Cardiovascular: Negative.   Gastrointestinal: Negative.   Genitourinary: Negative.   Musculoskeletal: Negative.   Skin: Negative.   Neurological: Positive for tremors.  Endo/Heme/Allergies: Negative.   Psychiatric/Behavioral: Positive for depression and substance abuse. The patient is nervous/anxious.     Blood pressure 93/56, pulse 85, temperature 97.5 F (36.4 C), temperature source Oral, resp. rate 20, height 5\' 7"  (1.702 m), weight 110.678 kg (244 lb), SpO2 98.00%.Body mass index is 38.21 kg/(m^2).  General Appearance: Fairly Groomed  Patent attorney::  Fair  Speech:  Clear and Coherent and Slow  Volume:  Decreased  Mood:  Anxious, Depressed, Hopeless and worried  Affect:  Restricted  Thought Process:  Coherent and Goal Directed  Orientation:  Full (Time, Place, and Person)  Thought Content:  worries, concerns  Suicidal Thoughts:  No  Homicidal Thoughts:  No  Memory:  Immediate;   Fair Recent;   Fair Remote;   Fair  Judgement:  Fair  Insight:  Present  Psychomotor  Activity:  Psychomotor Retardation and Restlessness  Concentration:  Fair  Recall:  Fair  Akathisia:  No  Handed:  Right  AIMS (if indicated):     Assets:  Desire for Improvement Housing  Sleep:  Number of Hours: 6.25   Current Medications: Current Facility-Administered Medications  Medication Dose Route Frequency Provider Last Rate Last Dose  . acetaminophen (TYLENOL) tablet 650 mg  650 mg Oral Q6H PRN Kerry Hough, PA-C      . alum & mag hydroxide-simeth (MAALOX/MYLANTA) 200-200-20 MG/5ML suspension 30 mL  30 mL Oral Q4H PRN Kerry Hough, PA-C   30 mL at 07/20/13 0917  . DULoxetine (CYMBALTA) DR capsule 30 mg  30 mg Oral Daily Rachael Fee, MD   30 mg at 07/21/13 0759  . folic acid (FOLVITE) tablet 1 mg  1 mg Oral Daily Rachael Fee, MD   1 mg at 07/21/13 0759  . gabapentin (NEURONTIN) capsule 300 mg  300 mg Oral TID Rachael Fee, MD   300 mg at 07/21/13 1655  . magnesium hydroxide (MILK OF MAGNESIA) suspension 30 mL  30 mL Oral Daily PRN Kerry Hough, PA-C      . multivitamin with minerals tablet 1 tablet  1 tablet Oral Daily Kerry Hough, PA-C   1 tablet at 07/21/13 0759  . naproxen (NAPROSYN) tablet 500 mg  500 mg Oral BID WC Rachael Fee, MD   500 mg at 07/21/13 1655  . pantoprazole (PROTONIX) EC tablet 40 mg  40 mg Oral Daily Rachael Fee, MD   [phone]  mg at 07/21/13 0800  . QUEtiapine (SEROQUEL) tablet 200 mg  200 mg Oral QHS Sanjuana Kava, NP   200 mg at 07/20/13 2104  . QUEtiapine (SEROQUEL) tablet 75 mg  75 mg Oral BID Rachael Fee, MD   75 mg at 07/21/13 1655  . thiamine (VITAMIN B-1) tablet 100 mg  100 mg Oral Daily Kerry Hough, PA-C   100 mg at 07/21/13 0800    Lab Results:  Results for orders placed during the hospital encounter of 07/16/13 (from the past 48 hour(s))  HEMOGLOBIN A1C     Status: None   Collection Time    07/19/13  7:31 PM      Result Value Range   Hemoglobin A1C 4.8  <5.7 %   Comment: (NOTE)                                                                                According to the ADA Clinical Practice Recommendations for 2011, when     HbA1c is used as a screening test:      >=6.5%   Diagnostic of Diabetes Mellitus               (if abnormal result is confirmed)     5.7-6.4%   Increased risk of developing Diabetes Mellitus     References:Diagnosis and Classification of Diabetes Mellitus,Diabetes     Care,2011,34(Suppl 1):S62-S69 and Standards of Medical Care in             Diabetes - 2011,Diabetes Care,2011,34 (Suppl 1):S11-S61.   Mean Plasma Glucose 91  <117 mg/dL    Physical Findings: AIMS: Facial and Oral Movements Muscles of Facial Expression: None, normal Lips and Perioral Area: None, normal Jaw: None, normal Tongue: None, normal,Extremity Movements Upper (arms, wrists, hands, fingers): None, normal Lower (legs, knees, ankles, toes): None, normal, Trunk Movements Neck, shoulders, hips: None, normal, Overall Severity Severity of abnormal movements (highest score from questions above): None, normal Incapacitation due to abnormal movements: None, normal Patient's awareness of abnormal movements (rate only patient's report): No Awareness, Dental Status Current problems with teeth and/or dentures?: Yes Does patient usually wear dentures?: No  CIWA:  CIWA-Ar Total: 2 COWS:     Treatment Plan Summary: Daily contact with patient to assess and evaluate symptoms and progress in treatment Medication management  Plan: Supportive approach/coping skills/relapse prevention Continue Seroquel at 75 mg BID,  Increased night time to 300 mg.  Medical Decision Making Problem Points:  Review of psycho-social stressors (1) Data Points:  Review of medication regiment & side effects (2) Review of new medications or change in dosage (2)  I certify that inpatient services furnished can reasonably be expected to improve the patient's condition.   Armandina Stammer I, PMHNP 07/21/2013, 5:09 PM

## 2013-07-22 DIAGNOSIS — F1010 Alcohol abuse, uncomplicated: Secondary | ICD-10-CM

## 2013-07-22 DIAGNOSIS — F1410 Cocaine abuse, uncomplicated: Secondary | ICD-10-CM

## 2013-07-22 MED ORDER — THYROID 30 MG PO TABS
30.0000 mg | ORAL_TABLET | Freq: Every day | ORAL | Status: DC
  Administered 2013-07-23 – 2013-07-24 (×2): 30 mg via ORAL
  Filled 2013-07-22 (×3): qty 1
  Filled 2013-07-22: qty 3
  Filled 2013-07-22: qty 1

## 2013-07-22 MED ORDER — LITHIUM CARBONATE ER 300 MG PO TBCR
300.0000 mg | EXTENDED_RELEASE_TABLET | Freq: Two times a day (BID) | ORAL | Status: DC
  Administered 2013-07-22 – 2013-07-24 (×4): 300 mg via ORAL
  Filled 2013-07-22 (×4): qty 1
  Filled 2013-07-22 (×2): qty 6
  Filled 2013-07-22 (×3): qty 1

## 2013-07-22 MED ORDER — QUETIAPINE FUMARATE 50 MG PO TABS
75.0000 mg | ORAL_TABLET | Freq: Three times a day (TID) | ORAL | Status: DC
  Filled 2013-07-22: qty 1

## 2013-07-22 MED ORDER — QUETIAPINE FUMARATE 50 MG PO TABS
75.0000 mg | ORAL_TABLET | Freq: Three times a day (TID) | ORAL | Status: DC
  Administered 2013-07-22 – 2013-07-24 (×6): 75 mg via ORAL
  Filled 2013-07-22 (×11): qty 1

## 2013-07-22 NOTE — Progress Notes (Signed)
Adult Psychoeducational Group Note  Date:  07/22/2013 Time:  3:23 PM  Group Topic/Focus:  Self Care:   The focus of this group is to help patients understand the importance of self-care in order to improve or restore emotional, physical, spiritual, interpersonal, and financial health.  Participation Level:  Active  Participation Quality:  Appropriate, Attentive and Sharing  Affect:  Appropriate  Cognitive:  Alert and Appropriate  Insight: Appropriate  Engagement in Group:  Engaged  Modes of Intervention:  Discussion  Additional Comments:  Pt was appropriate and sharing while attending group. Pt shared that self-care meant to clean your house. Pt shared that she has a lady that cleans her house once a month. Pt also shared that she feels she gets enough sleep but plans to work on how much she exercises.  Sharyn Lull 07/22/2013, 3:23 PM

## 2013-07-22 NOTE — [facility] Group Notes (Signed)
[facility] LCSW Aftercare Discharge Planning Group Note   07/22/2013 10:07 AM  Participation Quality:  Appropriate  Mood/Affect:  Appropriate  Depression Rating:  8  Anxiety Rating:  8  Thoughts of Suicide:  No Will you contract for safety?   NA  Current AVH:  No  Plan for Discharge/Comments:  Pt reports severe cravings for crack and feels that she is not mentally ready to d/c. She reports high anxiety and depression due to these cravings. Pt plans to followup with [facility] for med management and will be set up with therapist. Pt also would like to be on different medication for bipolar disorder and asked that this message be relayed to MD.   Transportation Means: unknown  Supports: limited family/friends  Smart, [name]

## 2013-07-22 NOTE — [facility] Group Notes (Signed)
[facility] LCSW Group Therapy  07/22/2013 3:58 PM  Type of Therapy:  Group Therapy  Participation Level:  Active  Participation Quality:  Monopolizing and redirectable  Affect:  Excited  Cognitive:  Alert  Insight:  Improving  Engagement in Therapy:  Improving  Modes of Intervention:  Confrontation, Discussion, Education, Exploration, Socialization and Support  Summary of Progress/Problems: Today's Topic: Overcoming Obstacles. Pt identified obstacles faced currently and processed barriers involved in overcoming these obstacles. Pt identified steps necessary for overcoming these obstacles and explored motivation (internal and external) for facing these difficulties head on. Pt further identified one area of concern in their lives and chose a skill of focus pulled from their "toolbox." Erin [name] talked about temptation and "fighting urges and cravings for crack" as her biggest obstacle that she anticipates facing after d/cing from [facility]. Erin [name] stated that she worries about returning to the same environment and having "bad people who know I am trying to stay clean try to persuade me to start using again." Erin [name] was asked to explore how she will deal with this obstacle. Erin [name] shows improving insight in the group setting AEB her ability to come up with ways to address this obstacle "I'm changing my number and I'm not answering my door." She also was able to take the suggestions of others when discussing the value of AA/NA and other recovery/support groups. Erin [name] would sometimes monopolize conversation and go off on tangents but was easily redirectable.     Smart, [name] 07/22/2013, 3:58 PM

## 2013-07-22 NOTE — Progress Notes (Signed)
[facility] MD Progress Note  07/22/2013 5:53 PM Erin [name]  MRN:  [id number] Subjective:  Erin [name] continues to have a hard time. She is still cravings "badly" can taste the cocaine. Says she was never affected by any drug as cocaine has done. States she is scared. She is afraid if she continues to act out with drugs she is going to lose the placement. She also reports that her mood is very unstable. She would like to up the Seroquel and would like to be back on the thyroid replacement as states is took a while to have it under control. She will like to try lithuim and see if it works for her she can get off some of the other medications Diagnosis:  Alcohol, Cocaine Abuse, Bipolar Disorder  ADL's:  Intact  Sleep: Fair  Appetite:  Fair  Suicidal Ideation:  Plan:  denies Intent:  denies Means:  denies Homicidal Ideation:  Plan:  denies Intent:  denies Means:  denies AEB (as evidenced by):  Psychiatric Specialty Exam: Review of Systems  Constitutional: Negative.   HENT: Negative.   Eyes: Negative.   Respiratory: Negative.   Cardiovascular: Negative.   Gastrointestinal: Positive for abdominal pain.  Genitourinary: Negative.   Musculoskeletal: Negative.   Skin: Negative.   Neurological: Negative.   Endo/Heme/Allergies: Negative.   Psychiatric/Behavioral: Positive for depression and substance abuse. The patient is nervous/anxious.     Blood pressure 109/75, pulse 85, temperature 97.5 F (36.4 C), temperature source Oral, resp. rate 20, height 5\' 7"  (1.702 m), weight 110.678 kg (244 lb), SpO2 98.00%.Body mass index is 38.21 kg/(m^2).  General Appearance: Fairly Groomed  Patent attorney::  Fair  Speech:  Clear and Coherent, Slow and hoarse voice  Volume:  Decreased  Mood:  Anxious, Depressed and worried  Affect:  Restricted  Thought Process:  Coherent and Goal Directed  Orientation:  Full (Time, Place, and Person)  Thought Content:  worries, concerns, fears  Suicidal Thoughts:  No   Homicidal Thoughts:  No  Memory:  Immediate;   Fair Recent;   Fair Remote;   Fair  Judgement:  Fair  Insight:  Present  Psychomotor Activity:  Restlessness  Concentration:  Fair  Recall:  Fair  Akathisia:  No  Handed:  Right  AIMS (if indicated):     Assets:  Desire for Improvement Housing  Sleep:  Number of Hours: 6.5   Current Medications: Current Facility-Administered Medications  Medication Dose Route Frequency Provider Last Rate Last Dose  . acetaminophen (TYLENOL) tablet 650 mg  650 mg Oral Q6H PRN Kerry Hough, PA-C      . alum & mag hydroxide-simeth (MAALOX/MYLANTA) 200-200-20 MG/5ML suspension 30 mL  30 mL Oral Q4H PRN Kerry Hough, PA-C   30 mL at 07/20/13 0917  . DULoxetine (CYMBALTA) DR capsule 30 mg  30 mg Oral Daily Rachael Fee, MD   30 mg at 07/22/13 0803  . folic acid (FOLVITE) tablet 1 mg  1 mg Oral Daily Rachael Fee, MD   1 mg at 07/22/13 0803  . gabapentin (NEURONTIN) capsule 300 mg  300 mg Oral TID Rachael Fee, MD   300 mg at 07/22/13 1720  . lithium carbonate (LITHOBID) CR tablet 300 mg  300 mg Oral BID Rachael Fee, MD      . magnesium hydroxide (MILK OF MAGNESIA) suspension 30 mL  30 mL Oral Daily PRN Kerry Hough, PA-C      . multivitamin with minerals tablet 1  tablet  1 tablet Oral Daily Kerry Hough, PA-C   1 tablet at 07/22/13 9562  . naproxen (NAPROSYN) tablet 500 mg  500 mg Oral BID WC Rachael Fee, MD   500 mg at 07/22/13 1720  . pantoprazole (PROTONIX) EC tablet 40 mg  40 mg Oral Daily Rachael Fee, MD   40 mg at 07/22/13 0803  . QUEtiapine (SEROQUEL) tablet 300 mg  300 mg Oral QHS Sanjuana Kava, NP   300 mg at 07/21/13 2125  . QUEtiapine (SEROQUEL) tablet 75 mg  75 mg Oral TID Rachael Fee, MD      . thiamine (VITAMIN B-1) tablet 100 mg  100 mg Oral Daily Kerry Hough, PA-C   100 mg at 07/22/13 0803  . [START ON 07/23/2013] thyroid (ARMOUR) tablet 30 mg  30 mg Oral QAC breakfast Rachael Fee, MD        Lab Results: No  results found for this or any previous visit (from the past 48 hour(s)).  Physical Findings: AIMS: Facial and Oral Movements Muscles of Facial Expression: None, normal Lips and Perioral Area: None, normal Jaw: None, normal Tongue: None, normal,Extremity Movements Upper (arms, wrists, hands, fingers): None, normal Lower (legs, knees, ankles, toes): None, normal, Trunk Movements Neck, shoulders, hips: None, normal, Overall Severity Severity of abnormal movements (highest score from questions above): None, normal Incapacitation due to abnormal movements: None, normal Patient's awareness of abnormal movements (rate only patient's report): No Awareness, Dental Status Current problems with teeth and/or dentures?: Yes Does patient usually wear dentures?: No  CIWA:  CIWA-Ar Total: 2 COWS:     Treatment Plan Summary: Daily contact with patient to assess and evaluate symptoms and progress in treatment Medication management  Plan: Supportive approach/coping skills/relapse prevention           Discussed the possible side effects of Lithium causing hypothyroidism. She understand but will still like to give it a try           Will resume the thyroid replacement            Will optimize treatment with Seroquel  Medical Decision Making Problem Points:  Review of psycho-social stressors (1) Data Points:  Review of medication regiment & side effects (2) Review of new medications or change in dosage (2)  I certify that inpatient services furnished can reasonably be expected to improve the patient's condition.   Tacori Kvamme A 07/22/2013, 5:53 PM

## 2013-07-22 NOTE — Progress Notes (Signed)
[facility] Group Notes:  (Nursing/MHT/Case Management/Adjunct)  Date:  07/22/2013  Time:  11:15 AM  Type of Therapy:  Therapeutic Activity  Participation Level:  Active  Participation Quality:  Appropriate, Attentive and Sharing  Affect:  Appropriate  Cognitive:  Appropriate  Insight:  Appropriate and Good  Engagement in Group:  Engaged  Modes of Intervention:  Activity, Discussion, Exploration and Socialization  Summary of Progress/Problems: [name] attended and participated in coping skills pictionary. Patient was able to choose a coping skills from box and draw what the skill was and peers in group were to guess the description of the coping skill.  Erin [name] 07/22/2013, 11:15 AM

## 2013-07-22 NOTE — Progress Notes (Signed)
Patient did attend the evening speaker AA meeting.  

## 2013-07-22 NOTE — Progress Notes (Signed)
Patient ID: Erin [name], female   DOB: 04/05/1966, 47 y.o.   MRN: [id number] She has been up and to groups interacting with peers and staff. Self inventory: Depression and hopelessness at 8, denies SI thoughts. Continues to report withdrawal symptoms but has not requested and medication.

## 2013-07-22 NOTE — Tx Team (Signed)
Interdisciplinary Treatment Plan Update (Adult)  Date: 07/22/2013  Time Reviewed:  9:45 AM  Progress in Treatment: Attending groups: Yes Participating in groups:  Yes Taking medication as prescribed:  Yes Tolerating medication:  Yes Family/Significant othe contact made: Pt refused Patient understands diagnosis:  Yes Discussing patient identified problems/goals with staff:  Yes Medical problems stabilized or resolved:  Yes Denies suicidal/homicidal ideation: Yes Issues/concerns per patient self-inventory:  Yes Other:  New problem(s) identified: N/A  Discharge Plan or Barriers: Pt has follow up scheduled at [facility] for medication management and therapy.    Reason for Continuation of Hospitalization: Anxiety Depression Medication Stabilization  Comments: N/A  Estimated length of stay: 1-2 days  For review of initial/current patient goals, please see plan of care.  Attendees: Patient:     Family:     Physician:  Dr. Dub Mikes 07/22/2013 1:03 PM   Nursing:   Roswell Miners, RN 07/22/2013 1:03 PM   Clinical Social Worker:  Reyes Ivan, LCSWA 07/22/2013 1:03 PM   Other: Robbie Louis, RN 07/22/2013 1:03 PM   Other:  Trula Slade, LCSWA 07/22/2013 1:03 PM   Other:  Serena Colonel, NP 07/22/2013 1:03 PM  Other:  Onnie Boer, case manager 07/22/2013 1:04 PM   Other:    Other:    Other:    Other:    Other:    Other:     Scribe for Treatment Team:   Carmina Miller, 07/22/2013 , 1:03 PM

## 2013-07-23 NOTE — Progress Notes (Signed)
Pt reports she has had a good day and may be going home on Wednesday.  She denies SI/HI/AV.  She says she has been going to groups.  Her plan is to avoid the people she knows that use drugs so she can stay clean/sober.  Pt makes her needs known to staff.  Support and encouragement offered.  Safety maintained with q15 minute checks.

## 2013-07-23 NOTE — Progress Notes (Signed)
Adult Psychoeducational Group Note  Date:  07/23/2013 Time:  3:29 PM  Group Topic/Focus:  Recovery Goals:   The focus of this group is to identify appropriate goals for recovery and establish a plan to achieve them.  Participation Level:  Active  Participation Quality:  Appropriate and Attentive  Affect:  Flat  Cognitive:  Disorganized  Insight: Lacking and Limited  Engagement in Group:  Engaged  Modes of Intervention:  Discussion, Socialization and Support  Additional Comments:  Pt came to group, but her speech was disorganized and hard to follow at times. Pt had to be aided to make her recovery goals. Pt stated that drug dealers and drinking stand between her and recovery. Pt plans on changing this by staying away from those certain drug places and staying on her medication and seeing a therapist.  Cathlean Cower 07/23/2013, 3:29 PM

## 2013-07-23 NOTE — Progress Notes (Signed)
Patient ID: Erin [name], female   DOB: 1966/02/09, 47 y.o.   MRN: [id number] [facility] MD Progress Note  07/23/2013 3:11 PM Erin [name]  MRN:  [id number]  Subjective:  Erin [name] reports that she is not feeling too good today, that is why she is in bed this afternoon. She stressed how much she enjoyed lunch that she apparently over ate. First time she admitted feeling like her mood is starting to improve. She explained it is because she recently started on Lithium carbonate. She says she believed that she may be going home soon. Erin [name] did not talk about any cravings and or withdrawal symptoms throughout this follow-up assessment.  Diagnosis:  Alcohol, Cocaine Abuse, Bipolar Disorder  ADL's:  Intact  Sleep: Fair  Appetite:  Fair  Suicidal Ideation:  Plan:  denies Intent:  denies Means:  denies Homicidal Ideation:  Plan:  denies Intent:  denies Means:  denies AEB (as evidenced by):  Psychiatric Specialty Exam: Review of Systems  Constitutional: Negative.   HENT: Negative.   Eyes: Negative.   Respiratory: Negative.   Cardiovascular: Negative.   Gastrointestinal: Positive for abdominal pain.  Genitourinary: Negative.   Musculoskeletal: Negative.   Skin: Negative.   Neurological: Negative.   Endo/Heme/Allergies: Negative.   Psychiatric/Behavioral: Positive for depression and substance abuse. The patient is nervous/anxious.     Blood pressure 99/64, pulse 90, temperature 96.6 F (35.9 C), temperature source Oral, resp. rate 18, height 5\' 7"  (1.702 m), weight 110.678 kg (244 lb), SpO2 98.00%.Body mass index is 38.21 kg/(m^2).  General Appearance: Fairly Groomed  Patent attorney::  Fair  Speech:  Clear and Coherent, Slow and hoarse voice  Volume:  Decreased  Mood: Improving  Affect:  Appropriate  Thought Process:  Coherent and Goal Directed  Orientation:  Full (Time, Place, and Person)  Thought Content:  Within normal  Suicidal Thoughts:  No  Homicidal Thoughts:  No  Memory:   Immediate;   Fair Recent;   Fair Remote;   Fair  Judgement:  Fair  Insight:  Present  Psychomotor Activity:  Restlessness  Concentration:  Fair  Recall:  Fair  Akathisia:  No  Handed:  Right  AIMS (if indicated):     Assets:  Desire for Improvement Housing  Sleep:  Number of Hours: 4.5   Current Medications: Current Facility-Administered Medications  Medication Dose Route Frequency Provider Last Rate Last Dose  . acetaminophen (TYLENOL) tablet 650 mg  650 mg Oral Q6H PRN Kerry Hough, PA-C      . alum & mag hydroxide-simeth (MAALOX/MYLANTA) 200-200-20 MG/5ML suspension 30 mL  30 mL Oral Q4H PRN Kerry Hough, PA-C   30 mL at 07/20/13 0917  . DULoxetine (CYMBALTA) DR capsule 30 mg  30 mg Oral Daily Rachael Fee, MD   30 mg at 07/23/13 0801  . folic acid (FOLVITE) tablet 1 mg  1 mg Oral Daily Rachael Fee, MD   1 mg at 07/23/13 0801  . gabapentin (NEURONTIN) capsule 300 mg  300 mg Oral TID Rachael Fee, MD   300 mg at 07/23/13 1141  . lithium carbonate (LITHOBID) CR tablet 300 mg  300 mg Oral BID Rachael Fee, MD   300 mg at 07/23/13 0801  . magnesium hydroxide (MILK OF MAGNESIA) suspension 30 mL  30 mL Oral Daily PRN Kerry Hough, PA-C      . multivitamin with minerals tablet 1 tablet  1 tablet Oral Daily Kerry Hough, PA-C   1  tablet at 07/23/13 0801  . naproxen (NAPROSYN) tablet 500 mg  500 mg Oral BID WC Rachael Fee, MD   500 mg at 07/23/13 0801  . pantoprazole (PROTONIX) EC tablet 40 mg  40 mg Oral Daily Rachael Fee, MD   40 mg at 07/23/13 0801  . QUEtiapine (SEROQUEL) tablet 300 mg  300 mg Oral QHS Sanjuana Kava, NP   300 mg at 07/22/13 2139  . QUEtiapine (SEROQUEL) tablet 75 mg  75 mg Oral TID Rachael Fee, MD   75 mg at 07/23/13 1142  . thiamine (VITAMIN B-1) tablet 100 mg  100 mg Oral Daily Kerry Hough, PA-C   100 mg at 07/23/13 0801  . thyroid (ARMOUR) tablet 30 mg  30 mg Oral QAC breakfast Rachael Fee, MD   30 mg at 07/23/13 0801    Lab Results:  No results found for this or any previous visit (from the past 48 hour(s)).  Physical Findings: AIMS: Facial and Oral Movements Muscles of Facial Expression: None, normal Lips and Perioral Area: None, normal Jaw: None, normal Tongue: None, normal,Extremity Movements Upper (arms, wrists, hands, fingers): None, normal Lower (legs, knees, ankles, toes): None, normal, Trunk Movements Neck, shoulders, hips: None, normal, Overall Severity Severity of abnormal movements (highest score from questions above): None, normal Incapacitation due to abnormal movements: None, normal Patient's awareness of abnormal movements (rate only patient's report): No Awareness, Dental Status Current problems with teeth and/or dentures?: Yes Does patient usually wear dentures?: No  CIWA:  CIWA-Ar Total: 2 COWS:     Treatment Plan Summary: Daily contact with patient to assess and evaluate symptoms and progress in treatment Medication management  Plan: Supportive approach/coping skills/relapse preventionDiscussed the possible side effects ofLithium causing hypothyroidism. She understand but will still like to give it a try Continue thyroid replacement Obtain lithium levels. Discharge plans in progress. Continue current plan of care.  Medical Decision Making Problem Points:  Review of psycho-social stressors (1) Data Points:  Review of medication regiment & side effects (2) Review of new medications or change in dosage (2)  I certify that inpatient services furnished can reasonably be expected to improve the patient's condition.   Armandina Stammer I, PMHNP-BC 07/23/2013, 3:11 PM

## 2013-07-23 NOTE — Progress Notes (Signed)
Patient ID: Erin [name], female   DOB: 1966/08/03, 47 y.o.   MRN: [id number] She has been up and to groups interacting with peers and staff.  She has been calmer and less intrusive today. Stated that " She feels the best today then she has felt in a long time".

## 2013-07-23 NOTE — Progress Notes (Signed)
Recreation Therapy Notes  Date: 07.29.2014 Time: 2:30pm Location: [address] Dayroom  Group Topic: Animal Assisted Activities  Goal Area(s) Addresses:  Patient will interact appropriately with dog team.    Behavioral Response: Engaged, Appropriate  Dog Team: [employer]  Education: Pharmacologist, Discharge Planning   Education Outcome: Acknowledges understanding  Clinical Observations/Feedback: Patient interacted appropriately with peer, dog team, LRT and MHT.   Marykay Lex Alanda Colton, LRT/CTRS  Pershing Skidmore L 07/23/2013 4:30 PM

## 2013-07-23 NOTE — Progress Notes (Signed)
The focus of this group is to educate the patient on the purpose and policies of crisis stabilization and provide a format to answer questions about their admission.  The group details unit policies and expectations of patients while admitted. Pt did attend and was engage the group.  She hopes by learning ways the unit is run she could use that in her everyday life.  She gave analogy that her "inner brain" would tell her one thing and her "outer brain" would tell her another.  She wished she would had this group when she first was admitted.  She stated,"I didn't understand why some people had a room by themselves this has helped me to understand". She went on to say that she felt she could use of this knowledge "on the out side world"

## 2013-07-23 NOTE — [facility] Group Notes (Signed)
[facility] LCSW Group Therapy  07/23/2013 3:26 PM  Type of Therapy:  Group Therapy  Participation Level:  Did Not Attend   Smart, Erin [name] 07/23/2013, 3:26 PM

## 2013-07-23 NOTE — Progress Notes (Signed)
Recreation Therapy Notes  Date: 07.29.2014 Time: 3:00pm Location: [address] Dayroom  Group Topic: Communication, Team Building, Problem Solving  Goal Area(s) Addresses:  Patient will be able to recognize use of communication, team building and problem solving during course of group activity. Patient will verbalize need for communication, team building and problem solving to make group activity successful.  Patient will verbalize benefit of communication, team building and problem solving to post d/c goals.   Behavioral Response: Appropriate, Engaged, Redirectable, Monoplolizing  Intervention: Problem Solving Activity  Activity: Cup Stack. Patients were given 10 solo cups and a rubber band with four strings attached. Patients were asked to work together to build a pyramid out of the cups, using on the stings and rubber band to pick up and put down the cups.    Education: Customer service manager, Building control surveyor, Recovery Process   Education Outcome: Acknowledges understanding   Clinical Observations/Feedback:  Activity only required four participations, patient chose to observe peer interact in activity. Patient sat to observe activity, offered no suggestions to group members engaged in activity, but congratulated group members when they were done. Patient recognized communication and team work as skills needed for that activity. Patient related the communication skills used in group to setting firm boundaries post d/c. Patient stated she plans on changing her phone number and not answering her door if people she used to do drugs with come to her home. Patient stated she was confident in these changes and with making them stick. Patient had periods of monopolizing the conversation and changing the subject of conversation, however she was easily redirected back to points addressed during group session.    Marykay Lex Jashawna Reever, LRT/CTRS  Mauricia Mertens L 07/23/2013 4:04 PM

## 2013-07-23 NOTE — Progress Notes (Signed)
D: Pt is labile in affect and mood. Pt is reporting detox symptoms of sweating. Pt attended group this evening. Pt is denying any SI/HI.  A: Writer administered scheduled medications to pt. Continued support and availability as needed was extended to this pt. Staff continue to monitor pt with q39min checks.  R: No adverse drug reactions noted. Pt receptive to treatment. Pt remains safe at this time.

## 2013-07-24 MED ORDER — ADULT MULTIVITAMIN W/MINERALS CH: 1.0000 | ORAL_TABLET | Freq: Every day | ORAL | Status: DC

## 2013-07-24 MED ORDER — LITHIUM CARBONATE ER 300 MG PO TBCR: 300.0000 mg | EXTENDED_RELEASE_TABLET | Freq: Two times a day (BID) | ORAL | Status: DC

## 2013-07-24 MED ORDER — ALBUTEROL SULFATE HFA 108 (90 BASE) MCG/ACT IN AERS: 1.0000 | INHALATION_SPRAY | Freq: Four times a day (QID) | RESPIRATORY_TRACT | Status: DC | PRN

## 2013-07-24 MED ORDER — THYROID 30 MG PO TABS: 30.0000 mg | ORAL_TABLET | Freq: Every day | ORAL | Status: DC

## 2013-07-24 MED ORDER — QUETIAPINE FUMARATE 300 MG PO TABS: 300.0000 mg | ORAL_TABLET | Freq: Every day | ORAL | Status: DC

## 2013-07-24 MED ORDER — DULOXETINE HCL 30 MG PO CPEP: 30.0000 mg | ORAL_CAPSULE | Freq: Every day | ORAL | Status: DC

## 2013-07-24 MED ORDER — QUETIAPINE FUMARATE 25 MG PO TABS
75.0000 mg | ORAL_TABLET | Freq: Three times a day (TID) | ORAL | Status: DC
  Filled 2013-07-24 (×4): qty 18

## 2013-07-24 MED ORDER — FOLIC ACID 1 MG PO TABS: 1.0000 mg | ORAL_TABLET | Freq: Every day | ORAL | Status: DC

## 2013-07-24 MED ORDER — QUETIAPINE FUMARATE 25 MG PO TABS: 75.0000 mg | ORAL_TABLET | Freq: Three times a day (TID) | ORAL | Status: DC

## 2013-07-24 MED ORDER — GABAPENTIN 300 MG PO CAPS: 300.0000 mg | ORAL_CAPSULE | Freq: Three times a day (TID) | ORAL | Status: DC

## 2013-07-24 NOTE — Consult Note (Signed)
Agree with assessment and plan Danh Bayus A. Akshar Starnes, M.D. 

## 2013-07-24 NOTE — Progress Notes (Signed)
[facility] Adult Case Management Discharge Plan :  Will you be returning to the same living situation after discharge: Yes,  home At discharge, do you have transportation home?:Yes,  daughter Do you have the ability to pay for your medications:Yes,  medicare  Release of information consent forms completed and in the chart;  Patient's signature needed at discharge.  Patient to Follow up at: Follow-up Information   Follow up with [facility] On 07/24/2013. (Walk in on this date for hospital discharge appointment.  Walk in clinic is Monday - Friday 8 am - 3 pm. )    Contact information:   [address], [place] [address] Phone: [phone] Fax: [phone]      Follow up with [facility]. (Call facility on day of discharge to set up time for assessment and transportation. )    Contact information:   [address] [place], [place] [address] phone: [phone] fax: [phone]      Patient denies SI/HI:   Yes,  in group/self report    Safety Planning and Suicide Prevention discussed:  Yes,  spe not required . Pt provided with SPI pamphlet.   Smart, Byrl Latin 07/24/2013, 11:05 AM

## 2013-07-24 NOTE — Progress Notes (Signed)
Adult Psychoeducational Group Note  Date:  07/24/2013 Time:  3:40 PM  Group Topic/Focus:  Personal Choices and Values:   The focus of this group is to help patients assess and explore the importance of values in their lives, how their values affect their decisions, how they express their values and what opposes their expression.  Participation Level:  Active  Participation Quality:  Appropriate, Attentive and Sharing  Affect:  Appropriate  Cognitive:  Alert, Appropriate and Oriented  Insight: Appropriate and Good  Engagement in Group:  Engaged  Modes of Intervention:  Confrontation, Education and Support  Additional Comments:  Pt identified "trust" as the value that she most wants when she discharges. She has a history of people who have disappointed her.  Reynolds Bowl 07/24/2013, 3:40 PM

## 2013-07-24 NOTE — [facility] Group Notes (Signed)
[facility] LCSW Group Therapy  07/24/2013 4:34 PM  Type of Therapy:  Group Therapy  Participation Level:  Active  Participation Quality:  Monopolizing  Affect:  Appropriate  Cognitive:  Oriented  Insight:  Improving  Engagement in Therapy:  Monopolizing  Modes of Intervention:  Discussion, Education, Exploration, Socialization and Support  Summary of Progress/Problems: Emotion Regulation: This group focused on both positive and negative emotion identification and allowed group members to process ways to identify feelings, regulate negative emotions, and find healthy ways to manage internal/external emotions. Group members were asked to reflect on a time when their reaction to an emotion led to a negative outcome and explored how alternative responses using emotion regulation would have benefited them. Group members were also asked to discuss a time when emotion regulation was utilized when a negative emotion was experienced. [name] stated that she is numb currently due to her medications but feels "good" about her stay at [facility] and future once she discharges later this afternoon. [name] talked about how her mother is a trigger for her and she has difficulty regulating her anger when being forced to interact with her mother. [name] was offered advice/encouragement from other group members concerning how to handle an intrusive and controlling parent. [name] tended to monopolize group time and had to be redirected frequently.    Smart, Braxtyn Bojarski 07/24/2013, 4:34 PM

## 2013-07-24 NOTE — [facility] Group Notes (Signed)
[facility] LCSW Aftercare Discharge Planning Group Note   07/24/2013 9:34 AM  Participation Quality:  Appropriate/monopolizing at times but redirectable.   Mood/Affect:  Appropriate  Depression Rating:  0  Anxiety Rating:  5  Thoughts of Suicide:  No Will you contract for safety?   NA  Current AVH:  No  Plan for Discharge/Comments:  Erin [name] plans to return home where she lives at ALF/apartment. She will follow up at [facility] for med management and [facility] for [facility] IOP. [facility] provide transportation when needed. Pt ready to d/c today.  Transportation Means: daughter  Supports: daughter/limited family supports.   Smart, [name]

## 2013-07-24 NOTE — Discharge Summary (Signed)
Physician Discharge Summary Note  Patient:  Erin [name] is an 47 y.o., female MRN:  [id number] DOB:  [birth year] Patient phone:  [phone] (home)  Patient address:   [address]  [address] [place] [address],   Date of Admission:  07/16/2013 Date of Discharge: 07/24/13  Reason for Admission:  Alcohol/drug detox, mood stabilization  Discharge Diagnoses: Principal Problem:   Alcohol dependence Active Problems:   DISORDER, BIPOLAR NOS   Cocaine dependence  Review of Systems  Constitutional: Negative.   HENT: Negative.   Eyes: Negative.   Respiratory: Negative.   Cardiovascular: Negative.   Gastrointestinal: Negative.   Genitourinary: Negative.   Musculoskeletal: Negative.   Skin: Negative.   Neurological: Negative.   Endo/Heme/Allergies: Negative.   Psychiatric/Behavioral: Positive for depression (Stabilized with medication prior to discharge) and substance abuse (Opiates, cocaine and Benzodiazepine dependenc). Negative for suicidal ideas, hallucinations and memory loss. The patient is nervous/anxious (Stabilized with medication prior to discharge) and has insomnia (Stabilized with medication prior to discharge).    Axis Diagnosis:   AXIS I:  Bipolar affective disorder, Alcohol dependence, Cocaine dependence AXIS II:  Deferred AXIS III:   Past Medical History  Diagnosis Date  . Hypothyroidism   . Blood transfusion July 2012  . Depression   . Anxiety   . Bipolar 1 disorder   . Menorrhagia   . Liver failure   . Bronchitis   . Cirrhosis of liver    AXIS IV:  other psychosocial or environmental problems and Substance abuse/dependence AXIS V:  60  Level of Care:  OP  Hospital Course:  Had been doing well for three years. States he got up with a guy who she did not know was using crack ans she relapsed. She started using crack very day ten started drinking to bring herself down. [place]. Mainly stays to herself. She started getting  out, has known this guy for 16 years. He was getting a divorce they got together. States that his brother died in 12-13-23 from cancer (at [facility] [place]) Has had multiple traumatic experiences  Upon admission into this hospital, and after admission assessment/evaluation coupled with UDS/Toxicology reports, it was determined that Ms. Jantz will need detoxification treatment protocol to stabilize her system of drug intoxication and to combat the withdrawal symptoms of these substances as well.  She was then started on Librium treatment protocol. She was also enrolled in group counseling sessions and activities where she was counseled, taught and learned coping skills that should help her after discharge to cope better and manage her substance abuse issues to sustain a much longer sobriety. She also attended AA/NA meetings being offered and held on this unit. She has some previously existing and or identifiable medical conditions that required treatment and or monitoring. She received medication management for all those health issues as well. She was monitored closely for any potential problems that may arise as a result of and or during detoxification treatment. Patient tolerated her treatment regimen and detoxification treatment protocol without any significant adverse effects and or reactions presented.  Patient attended treatment team meeting this am and met with the treatment team members. Her reason for admission, present symptoms, substance abuse issues, response to treatment and discharge plans discussed. Patient endorsed that she is doing well and stable for discharge to pursue the next phase of her substance abuse treatment. It was then agreed upon that he will follow-up care at the [facility] psychiatric treatment clinic here in [place], [place] for  medication management and routine psychiatric care. Ms. Langwell has been informed that this is a walk-in appointment between the hours of 08:00 am and 03:00 pm. She will  also resume substance abuse treatment at the [facility] here in [place], [place] as well. Patient is instructed to call [facility] on the day of her discharge to set up time for assessment.  Besides the treatments received here and scheduled outpatient psychiatric services, patient was encouraged to join/attend AA/NA meetings offered and held within her community. She was also ordered and received Cymbalta 30 mg daily for depression, Seroquel 300 mg Q bedtime for mood control and 75 mg tid for anxiety, Neurontin 300 mg tid for anxiety and Lithium Carbonate 300 mg twice daily for mood stabilization.  Upon discharge, patient adamantly denies suicidal, homicidal ideations, auditory, visual hallucinations, delusional thougts and or withdrawal symptoms. Patient left [facility] with all personal belongings in no apparent distress. She received 4 days worth supply samples of her [facility] discharge medications provided by [facility] pharmacy. Transportation per family.   Consults:  psychiatry  Significant Diagnostic Studies:  labs: CBC with diff, CMP, UDS, Toxicology tests, U/A, Lithium levels  Discharge Vitals:   Blood pressure 110/79, pulse 94, temperature 96.6 F (35.9 C), temperature source Oral, resp. rate 16, height 5\' 7"  (1.702 m), weight 110.678 kg (244 lb), SpO2 98.00%. Body mass index is 38.21 kg/(m^2). Lab Results:   No results found for this or any previous visit (from the past 72 hour(s)).  Physical Findings: AIMS: Facial and Oral Movements Muscles of Facial Expression: None, normal Lips and Perioral Area: None, normal Jaw: None, normal Tongue: None, normal,Extremity Movements Upper (arms, wrists, hands, fingers): None, normal Lower (legs, knees, ankles, toes): None, normal, Trunk Movements Neck, shoulders, hips: None, normal, Overall Severity Severity of abnormal movements (highest score from questions above): None, normal Incapacitation due to abnormal movements: None,  normal Patient's awareness of abnormal movements (rate only patient's report): No Awareness, Dental Status Current problems with teeth and/or dentures?: Yes Does patient usually wear dentures?: No  CIWA:  CIWA-Ar Total: 2 COWS:     Psychiatric Specialty Exam: See Psychiatric Specialty Exam and Suicide Risk Assessment completed by Attending Physician prior to discharge.  Discharge destination:  Home  Is patient on multiple antipsychotic therapies at discharge:  No   Has Patient had three or more failed trials of antipsychotic monotherapy by history:  No  Recommended Plan for Multiple Antipsychotic Therapies: NA     Medication List    STOP taking these medications       ALPRAZolam 1 MG tablet  Commonly known as:  XANAX     buPROPion 150 MG 12 hr tablet  Commonly known as:  WELLBUTRIN SR     furosemide 20 MG tablet  Commonly known as:  LASIX     gabapentin 800 MG tablet  Commonly known as:  NEURONTIN  Replaced by:  gabapentin 300 MG capsule     HYDROcodone-acetaminophen 5-325 MG per tablet  Commonly known as:  NORCO/VICODIN     oxyCODONE-acetaminophen 10-325 MG per tablet  Commonly known as:  PERCOCET     thiamine 100 MG tablet      TAKE these medications     Indication   albuterol 108 (90 BASE) MCG/ACT inhaler  Commonly known as:  PROVENTIL HFA;VENTOLIN HFA  Inhale 1-2 puffs into the lungs every 6 (six) hours as needed for wheezing.   Indication:  Asthma     DULoxetine 30 MG  capsule  Commonly known as:  CYMBALTA  Take 1 capsule (30 mg total) by mouth daily. For depression   Indication:  Generalized Anxiety Disorder, Major Depressive Disorder, Musculoskeletal Pain, Neuropathic Pain     folic acid 1 MG tablet  Commonly known as:  FOLVITE  Take 1 tablet (1 mg total) by mouth daily. For folic acid replacement   Indication:  Deficiency of Folic Acid in the Diet     gabapentin 300 MG capsule  Commonly known as:  NEURONTIN  Take 1 capsule (300 mg total) by  mouth 3 (three) times daily. For anxiety/pain management   Indication:  Agitation, Pain, Anxiety     lithium carbonate 300 MG CR tablet  Commonly known as:  LITHOBID  Take 1 tablet (300 mg total) by mouth 2 (two) times daily. Mood stabilization   Indication:  Manic-Depression, Mood stabilization     multivitamin with minerals Tabs  Take 1 tablet by mouth daily. For low vitamin   Indication:  Vitamin supplement     QUEtiapine 300 MG tablet  Commonly known as:  SEROQUEL  Take 1 tablet (300 mg total) by mouth at bedtime. For mood control   Indication:  Depressive Phase of Manic-Depression, Trouble Sleeping, Mood control     QUEtiapine 25 MG tablet  Commonly known as:  SEROQUEL  Take 3 tablets (75 mg total) by mouth 3 (three) times daily. For mood control/anxiety   Indication:  Depressive Phase of Manic-Depression, Mood control, anxiety     thyroid 30 MG tablet  Commonly known as:  ARMOUR  Take 1 tablet (30 mg total) by mouth daily before breakfast. For low thyroid hormone   Indication:  Underactive Thyroid       Follow-up Information   Follow up with [facility] On 07/24/2013. (Walk in on this date for hospital discharge appointment.  Walk in clinic is Monday - Friday 8 am - 3 pm. )    Contact information:   [address], [place] [address] Phone: [phone] Fax: [phone]      Follow up with [facility]. (Call facility on day of discharge to set up time for assessment and transportation. )    Contact information:   [address] [name], [place] [address] phone: [phone] fax: [phone]     Follow-up recommendations:  Activity:  As tolerated Diet: As recommended by your primary care doctor. Keep all scheduled follow-up appointments as recommended. Continue to work your relapse prevention plan Comments: Take all your medications as prescribed by your mental healthcare provider. Report any adverse effects and or reactions from your medicines to  your outpatient provider promptly. Patient is instructed and cautioned to not engage in alcohol and or illegal drug use while on prescription medicines. In the event of worsening symptoms, patient is instructed to call the crisis hotline, 911 and or go to the nearest ED for appropriate evaluation and treatment of symptoms. Follow-up with your primary care provider for your other medical issues, concerns and or health care needs.   Total Discharge Time:  Greater than 30 minutes.  Signed: Sanjuana Kava, PMHNP-BC, FNP 07/25/2013, 1:48 PM Agree with assessment and plan Reymundo Poll. [place], [place].D

## 2013-07-24 NOTE — [facility] Suicide Risk Assessment (Signed)
Suicide Risk Assessment  Discharge Assessment     Demographic Factors:  Caucasian  Mental Status Per Nursing Assessment::   On Admission:     Current Mental Status by Physician: In full contact with reality.There are no suicidal ideas, plans or intent. Her mood is euthymic her affect is appropriate. She is committed to abstinence. She is going to participate in a PSR program and follow up with [facility].    Loss Factors: Decline in physical health  Historical Factors: Family history of mental illness, substance absue   Risk Reduction Factors:   Sense of responsibility to family and Positive social support  Continued Clinical Symptoms:  Bipolar Disorder:   Depressive phase Alcohol/Substance Abuse/Dependencies  Cognitive Features That Contribute To Risk:  Closed-mindedness Polarized thinking Thought constriction (tunnel vision)    Suicide Risk:  Minimal: No identifiable suicidal ideation.  Patients presenting with no risk factors but with morbid ruminations; may be classified as minimal risk based on the severity of the depressive symptoms  Discharge Diagnoses:   AXIS I:  Alcohol ,cocaine dependence, Bipolar Disorder AXIS II:  Deferred AXIS III:   Past Medical History  Diagnosis Date  . Hypothyroidism   . Blood transfusion July 2012  . Depression   . Anxiety   . Bipolar 1 disorder   . Menorrhagia   . Liver failure   . Bronchitis   . Cirrhosis of liver    AXIS IV:  other psychosocial or environmental problems AXIS V:  61-70 mild symptoms  Plan Of Care/Follow-up recommendations:  Activity:  as tolerated Diet:  regular Follow up [facility] and a PSR program Is patient on multiple antipsychotic therapies at discharge:  No   Has Patient had three or more failed trials of antipsychotic monotherapy by history:  No  Recommended Plan for Multiple Antipsychotic Therapies: N/A   Erin [name] A 07/24/2013, 4:10 PM

## 2013-07-24 NOTE — Progress Notes (Signed)
Patient ID: Erin [name], female   DOB: 01/31/1966, 47 y.o.   MRN: [id number] She has been discharged home and was picked up by her daughter. She voiced understanding of the discharge teaching of fall safety, medication and discharge instructions. Spoke of feeling much better.

## 2013-07-26 NOTE — Progress Notes (Signed)
Patient Discharge Instructions:  After Visit Summary (AVS):   Faxed to:  07/26/13 Discharge Summary Note:   Faxed to:  07/26/13 Psychiatric Admission Assessment Note:   Faxed to:  07/26/13 Suicide Risk Assessment - Discharge Assessment:   Faxed to:  07/26/13 Faxed/Sent to the Next Level Care provider:  07/26/13 Faxed to [facility] @ [phone] Faxed to [facility] @ [phone]  Jerelene Redden, 07/26/2013, 3:57 PM

## 2013-09-12 ENCOUNTER — Encounter (HOSPITAL_COMMUNITY): Payer: Self-pay | Admitting: *Deleted

## 2013-09-12 ENCOUNTER — Emergency Department (HOSPITAL_COMMUNITY)
Admission: EM | Admit: 2013-09-12 | Discharge: 2013-09-12 | Disposition: A | Discharge status: Home / Self Care | Payer: Medicare Other | Attending: Emergency Medicine | Admitting: Emergency Medicine

## 2013-09-12 ENCOUNTER — Emergency Department (HOSPITAL_COMMUNITY): Payer: Medicare Other

## 2013-09-12 DIAGNOSIS — F319 Bipolar disorder, unspecified: Secondary | ICD-10-CM | POA: Insufficient documentation

## 2013-09-12 DIAGNOSIS — F411 Generalized anxiety disorder: Secondary | ICD-10-CM | POA: Insufficient documentation

## 2013-09-12 DIAGNOSIS — E039 Hypothyroidism, unspecified: Secondary | ICD-10-CM | POA: Insufficient documentation

## 2013-09-12 DIAGNOSIS — Z79899 Other long term (current) drug therapy: Secondary | ICD-10-CM | POA: Insufficient documentation

## 2013-09-12 DIAGNOSIS — R141 Gas pain: Secondary | ICD-10-CM | POA: Insufficient documentation

## 2013-09-12 DIAGNOSIS — R142 Eructation: Secondary | ICD-10-CM | POA: Insufficient documentation

## 2013-09-12 DIAGNOSIS — Z8719 Personal history of other diseases of the digestive system: Secondary | ICD-10-CM | POA: Insufficient documentation

## 2013-09-12 DIAGNOSIS — F17200 Nicotine dependence, unspecified, uncomplicated: Secondary | ICD-10-CM | POA: Insufficient documentation

## 2013-09-12 DIAGNOSIS — R071 Chest pain on breathing: Secondary | ICD-10-CM | POA: Insufficient documentation

## 2013-09-12 DIAGNOSIS — J069 Acute upper respiratory infection, unspecified: Secondary | ICD-10-CM | POA: Insufficient documentation

## 2013-09-12 LAB — COMPREHENSIVE METABOLIC PANEL
Albumin: 3.8 g/dL (ref 3.5–5.2)
Alkaline Phosphatase: 111 U/L (ref 39–117)
BUN: 7 mg/dL (ref 6–23)
Calcium: 8.8 mg/dL (ref 8.4–10.5)
Potassium: 3.7 mEq/L (ref 3.5–5.1)
Total Protein: 7.5 g/dL (ref 6.0–8.3)

## 2013-09-12 LAB — POCT I-STAT TROPONIN I: Troponin i, poc: 0 ng/mL (ref 0.00–0.08)

## 2013-09-12 LAB — CBC WITH DIFFERENTIAL/PLATELET
Basophils Absolute: 0 10*3/uL (ref 0.0–0.1)
Basophils Relative: 1 % (ref 0–1)
Eosinophils Absolute: 0.1 10*3/uL (ref 0.0–0.7)
MCH: 33 pg (ref 26.0–34.0)
MCHC: 34.7 g/dL (ref 30.0–36.0)
Neutrophils Relative %: 75 % (ref 43–77)
Platelets: 69 10*3/uL — ABNORMAL LOW (ref 150–400)
WBC: 4 10*3/uL (ref 4.0–10.5)

## 2013-09-12 MED ORDER — PROMETHAZINE HCL 25 MG PO TABS: 25.0000 mg | ORAL_TABLET | Freq: Four times a day (QID) | ORAL | Status: DC | PRN

## 2013-09-12 MED ORDER — ALBUTEROL SULFATE HFA 108 (90 BASE) MCG/ACT IN AERS
2.0000 | INHALATION_SPRAY | Freq: Once | RESPIRATORY_TRACT | Status: AC
  Administered 2013-09-12: 2 via RESPIRATORY_TRACT
  Filled 2013-09-12: qty 6.7

## 2013-09-12 MED ORDER — ONDANSETRON HCL 4 MG/2ML IJ SOLN
4.0000 mg | Freq: Once | INTRAMUSCULAR | Status: AC
  Administered 2013-09-12: 4 mg via INTRAVENOUS
  Filled 2013-09-12: qty 2

## 2013-09-12 MED ORDER — METHYLPREDNISOLONE SODIUM SUCC 125 MG IJ SOLR
125.0000 mg | Freq: Once | INTRAMUSCULAR | Status: AC
  Administered 2013-09-12: 125 mg via INTRAVENOUS
  Filled 2013-09-12: qty 2

## 2013-09-12 MED ORDER — BENZONATATE 100 MG PO CAPS: 100.0000 mg | ORAL_CAPSULE | Freq: Three times a day (TID) | ORAL | Status: DC

## 2013-09-12 MED ORDER — ALBUTEROL SULFATE (2.5 MG/3ML) 0.083% IN NEBU: 2.5000 mg | INHALATION_SOLUTION | Freq: Four times a day (QID) | RESPIRATORY_TRACT | Status: DC | PRN

## 2013-09-12 MED ORDER — ALBUTEROL SULFATE (5 MG/ML) 0.5% IN NEBU
5.0000 mg | INHALATION_SOLUTION | Freq: Once | RESPIRATORY_TRACT | Status: AC
  Administered 2013-09-12: 5 mg via RESPIRATORY_TRACT
  Filled 2013-09-12: qty 1

## 2013-09-12 MED ORDER — AMOXICILLIN 500 MG PO CAPS: 500.0000 mg | ORAL_CAPSULE | Freq: Three times a day (TID) | ORAL | Status: DC

## 2013-09-12 MED ORDER — SODIUM CHLORIDE 0.9 % IV BOLUS (SEPSIS)
500.0000 mL | Freq: Once | INTRAVENOUS | Status: AC
  Administered 2013-09-12: 500 mL via INTRAVENOUS

## 2013-09-12 NOTE — ED Notes (Signed)
Pt with rhinitis x 1 week.  Pt c/o rib pain with coughing.  Now state she is unable to keep food down d/t vomiting up phlegm and bile.  C/o diarrhea today.  Also c/o sob with ambulation.

## 2013-09-12 NOTE — ED Provider Notes (Signed)
CSN: [id number]     Arrival date & time 09/12/13  0945 History   First MD Initiated Contact with Patient 09/12/13 1116     Chief Complaint  Patient presents with  . Emesis  . URI   (Consider location/radiation/quality/duration/timing/severity/associated sxs/prior Treatment) HPI  Erin [name] is a 47 y.o. female with past medical history significant for cirrhosis, bipolar 1, hypothyroid, active smoker complaining of 3 days of productive cough with pleuritic abdominal and chest pain associated with posttussive emesis and dyspnea on exertion, nonbloody, nonbilious diarrhea onset in the last 24 hours. Patient denies fever, change in bowel or bladder habits.  She's been trying Mucinex with little relief.    Past Medical History  Diagnosis Date  . Hypothyroidism   . Blood transfusion July 2012  . Depression   . Anxiety   . Bipolar 1 disorder   . Menorrhagia   . Liver failure   . Bronchitis   . Cirrhosis of liver    Past Surgical History  Procedure Laterality Date  . Tubal ligation    . Laparoscopy    . Orthopedic surgery    . Cholecystectomy    . Fracture surgery    . Hernia repair    . Vaginal hysterectomy  07/27/2011    Procedure: HYSTERECTOMY VAGINAL;  Surgeon: Scheryl Darter, MD;  Location: [facility];  Service: Gynecology;  Laterality: N/A;  Vaginal Hysterectomy with Right Oophorectomy  . Laparotomy  07/28/2011    Procedure: EXPLORATORY LAPAROTOMY;  Surgeon: Tilda Burrow, MD;  Location: [facility] ORS;  Service: Gynecology;  Laterality: N/A;   Family History  Problem Relation Age of Onset  . Diabetes Mother   . Hypertension Mother   . Diabetes Daughter   . Hypertension Daughter    History  Substance Use Topics  . Smoking status: Current Some Day Smoker -- 0.25 packs/day  . Smokeless tobacco: Never Used  . Alcohol Use: Yes     Comment: occasional   OB History   Grav Para Term Preterm Abortions TAB SAB Ect Mult Living   4 3 3  0 1     3     Review of Systems 10 systems  reviewed and found to be negative, except as noted in the HPI  Allergies  Fluoxetine hcl; Metoclopramide hcl; and Morphine and related  Home Medications   Current Outpatient Rx  Name  Route  Sig  Dispense  Refill  . albuterol (PROVENTIL HFA;VENTOLIN HFA) 108 (90 BASE) MCG/ACT inhaler   Inhalation   Inhale 1-2 puffs into the lungs every 6 (six) hours as needed for wheezing.         . DULoxetine (CYMBALTA) 30 MG capsule   Oral   Take 1 capsule (30 mg total) by mouth daily. For depression   30 capsule   0   . folic acid (FOLVITE) 1 MG tablet   Oral   Take 1 tablet (1 mg total) by mouth daily. For folic acid replacement   30 tablet   0   . gabapentin (NEURONTIN) 300 MG capsule   Oral   Take 1 capsule (300 mg total) by mouth 3 (three) times daily. For anxiety/pain management   90 capsule   0   . lithium carbonate (LITHOBID) 300 MG CR tablet   Oral   Take 1 tablet (300 mg total) by mouth 2 (two) times daily. Mood stabilization   60 tablet   0   . Multiple Vitamin (MULTIVITAMIN WITH MINERALS) TABS   Oral  Take 1 tablet by mouth daily. For low vitamin   30 tablet   0   . QUEtiapine (SEROQUEL) 25 MG tablet   Oral   Take 3 tablets (75 mg total) by mouth 3 (three) times daily. For mood control/anxiety   270 tablet   0   . QUEtiapine (SEROQUEL) 300 MG tablet   Oral   Take 1 tablet (300 mg total) by mouth at bedtime. For mood control   30 tablet   0   . thyroid (ARMOUR) 30 MG tablet   Oral   Take 1 tablet (30 mg total) by mouth daily before breakfast. For low thyroid hormone   30 tablet   0    BP 133/81  Pulse 84  Temp(Src) 98.1 F (36.7 C) (Oral)  Resp 18  Ht 5\' 7"  (1.702 m)  Wt 240 lb (108.863 kg)  BMI 37.58 kg/m2  SpO2 99% Physical Exam  Nursing note and vitals reviewed. Constitutional: She is oriented to person, place, and time. She appears well-developed and well-nourished. No distress.  Coughing  HENT:  Head: Normocephalic.  Mouth/Throat:  Oropharynx is clear and moist.  Eyes: Conjunctivae and EOM are normal. Pupils are equal, round, and reactive to light.  Neck: Normal range of motion. Neck supple. No JVD present.  Cardiovascular: Normal rate, regular rhythm and intact distal pulses.   Pulmonary/Chest: Effort normal. No stridor. No respiratory distress. She has wheezes. She has no rales. She exhibits no tenderness.  Mild scattered expiratory wheezing  Abdominal: Soft. Bowel sounds are normal. She exhibits distension. She exhibits no mass. There is tenderness. There is no rebound and no guarding.  Abdomen distended, mildly tense, no fluid wave, significant firm hepatomegaly.  Mild tenderness to palpation left lower quadrant with no guarding or rebound.  Musculoskeletal: Normal range of motion. She exhibits no edema.  Neurological: She is alert and oriented to person, place, and time.  Psychiatric: She has a normal mood and affect.    ED Course  Procedures (including critical care time) Labs Review Labs Reviewed  COMPREHENSIVE METABOLIC PANEL - Abnormal; Notable for the following:    Total Bilirubin 1.3 (*)    All other components within normal limits  CBC WITH DIFFERENTIAL - Abnormal; Notable for the following:    Platelets 69 (*)    All other components within normal limits  POCT I-STAT TROPONIN I   Imaging Review Dg Chest 2 View  09/12/2013   CLINICAL DATA:  Emesis. Upper respiratory tract infection.  EXAM: CHEST  2 VIEW  COMPARISON:  CT chest 09/01/2012. PA and lateral chest 09/01/2012 and 01/04/2013.  FINDINGS: Mediastinal and hilar lymphadenopathy are again seen and appear improved since the most recent chest films. There is no focal airspace disease or effusion. Heart size is normal. No focal bony abnormality.  IMPRESSION: No acute finding. Lymphadenopathy appears improved compared to the most recent chest films.   Electronically Signed   By: Drusilla Kanner M.D.   On: 09/12/2013 10:37    Date: 09/12/2013  Rate:  87  Rhythm: normal sinus rhythm  QRS Axis: normal  Intervals: normal  ST/T Wave abnormalities: normal  Conduction Disutrbances:none  Narrative Interpretation:   Old EKG Reviewed: None available  MDM   1. URI (upper respiratory infection)     Filed Vitals:   09/12/13 0952 09/12/13 1123  BP: 147/94 133/81  Pulse: 88 84  Temp: 98.1 F (36.7 C)   TempSrc: Oral   Resp: 22 18  Height: 5\' 7"  (1.702 m)  Weight: 240 lb (108.863 kg)   SpO2: 99% 99%     Erin [name] is a 47 y.o. female with pleuritic chest and abdominal pain, rhinorrhea with cough and posttussive emesis associated with diarrhea. Lung sounds are clear except for scattered expiratory wheezing. Patient is a smoker and I think there is an element of COPD. Chest x-ray shows no acute abnormalities, however because she is a smoker think it is reasonable to start her on antibiotics. Patient reports that she feels much better after Solu-Medrol and nebulizer treatment, lung sounds are improved. She is tolerating by mouth at this time. I will send her home with amoxicillin, nebulizer capsules, Phenergan and a cough suppressant. We have discussed return precautions.  Medications  ondansetron (ZOFRAN) injection 4 mg (4 mg Intravenous Given 09/12/13 1229)  sodium chloride 0.9 % bolus 500 mL (0 mLs Intravenous Stopped 09/12/13 1332)  methylPREDNISolone sodium succinate (SOLU-MEDROL) 125 mg/2 mL injection 125 mg (125 mg Intravenous Given 09/12/13 1229)  albuterol (PROVENTIL) (5 MG/ML) 0.5% nebulizer solution 5 mg (5 mg Nebulization Given 09/12/13 1229)  albuterol (PROVENTIL HFA;VENTOLIN HFA) 108 (90 BASE) MCG/ACT inhaler 2 puff (2 puffs Inhalation Given 09/12/13 1332)    Pt is hemodynamically stable, appropriate for, and amenable to discharge at this time. Pt verbalized understanding and agrees with care plan. All questions answered. Outpatient follow-up and specific return precautions discussed.    New Prescriptions   ALBUTEROL  (PROVENTIL) (2.5 MG/3ML) 0.083% NEBULIZER SOLUTION    Take 3 mLs (2.5 mg total) by nebulization every 6 (six) hours as needed for wheezing.   AMOXICILLIN (AMOXIL) 500 MG CAPSULE    Take 1 capsule (500 mg total) by mouth 3 (three) times daily.   BENZONATATE (TESSALON) 100 MG CAPSULE    Take 1 capsule (100 mg total) by mouth every 8 (eight) hours.   PROMETHAZINE (PHENERGAN) 25 MG TABLET    Take 1 tablet (25 mg total) by mouth every 6 (six) hours as needed for nausea.    Note: Portions of this report may have been transcribed using voice recognition software. Every effort was made to ensure accuracy; however, inadvertent computerized transcription errors may be present      Wynetta Emery, PA-C 09/12/13 1347

## 2013-09-12 NOTE — ED Provider Notes (Signed)
Medical screening examination/treatment/procedure(s) were performed by non-physician practitioner and as supervising physician I was immediately available for consultation/collaboration.  Donnetta Hutching, MD 09/12/13 [phone]

## 2014-05-03 ENCOUNTER — Encounter (HOSPITAL_COMMUNITY): Payer: Self-pay | Admitting: Emergency Medicine

## 2014-05-03 ENCOUNTER — Inpatient Hospital Stay (HOSPITAL_COMMUNITY)
Admission: EM | Admit: 2014-05-03 | Discharge: 2014-05-03 | DRG: 897 | Disposition: A | Discharge status: Other Acute Inpatient Hospital | Payer: Medicare Other | Attending: Emergency Medicine | Admitting: Emergency Medicine

## 2014-05-03 ENCOUNTER — Inpatient Hospital Stay (HOSPITAL_COMMUNITY)
Admission: AD | Admit: 2014-05-03 | Discharge: 2014-05-16 | DRG: 897 | Disposition: A | Discharge status: Home / Self Care | Payer: Medicare Other | Source: Intra-hospital | Attending: Emergency Medicine | Admitting: Emergency Medicine

## 2014-05-03 ENCOUNTER — Emergency Department (HOSPITAL_COMMUNITY): Payer: Medicare Other

## 2014-05-03 DIAGNOSIS — F19239 Other psychoactive substance dependence with withdrawal, unspecified: Secondary | ICD-10-CM | POA: Diagnosis not present

## 2014-05-03 DIAGNOSIS — F1220 Cannabis dependence, uncomplicated: Secondary | ICD-10-CM | POA: Diagnosis present

## 2014-05-03 DIAGNOSIS — R259 Unspecified abnormal involuntary movements: Secondary | ICD-10-CM | POA: Diagnosis present

## 2014-05-03 DIAGNOSIS — E039 Hypothyroidism, unspecified: Secondary | ICD-10-CM | POA: Diagnosis present

## 2014-05-03 DIAGNOSIS — F1020 Alcohol dependence, uncomplicated: Secondary | ICD-10-CM | POA: Diagnosis present

## 2014-05-03 DIAGNOSIS — K859 Acute pancreatitis without necrosis or infection, unspecified: Secondary | ICD-10-CM

## 2014-05-03 DIAGNOSIS — F17200 Nicotine dependence, unspecified, uncomplicated: Secondary | ICD-10-CM | POA: Diagnosis present

## 2014-05-03 DIAGNOSIS — J40 Bronchitis, not specified as acute or chronic: Secondary | ICD-10-CM | POA: Diagnosis present

## 2014-05-03 DIAGNOSIS — Z833 Family history of diabetes mellitus: Secondary | ICD-10-CM | POA: Diagnosis not present

## 2014-05-03 DIAGNOSIS — K7460 Unspecified cirrhosis of liver: Secondary | ICD-10-CM | POA: Diagnosis present

## 2014-05-03 DIAGNOSIS — F319 Bipolar disorder, unspecified: Secondary | ICD-10-CM

## 2014-05-03 DIAGNOSIS — F1994 Other psychoactive substance use, unspecified with psychoactive substance-induced mood disorder: Secondary | ICD-10-CM | POA: Diagnosis present

## 2014-05-03 DIAGNOSIS — G4700 Insomnia, unspecified: Secondary | ICD-10-CM | POA: Diagnosis present

## 2014-05-03 DIAGNOSIS — IMO0002 Reserved for concepts with insufficient information to code with codable children: Secondary | ICD-10-CM | POA: Diagnosis present

## 2014-05-03 DIAGNOSIS — K5900 Constipation, unspecified: Secondary | ICD-10-CM | POA: Diagnosis not present

## 2014-05-03 DIAGNOSIS — F10239 Alcohol dependence with withdrawal, unspecified: Secondary | ICD-10-CM

## 2014-05-03 DIAGNOSIS — E876 Hypokalemia: Secondary | ICD-10-CM | POA: Diagnosis present

## 2014-05-03 DIAGNOSIS — F329 Major depressive disorder, single episode, unspecified: Secondary | ICD-10-CM | POA: Diagnosis present

## 2014-05-03 DIAGNOSIS — Z79899 Other long term (current) drug therapy: Secondary | ICD-10-CM

## 2014-05-03 DIAGNOSIS — F1420 Cocaine dependence, uncomplicated: Secondary | ICD-10-CM

## 2014-05-03 DIAGNOSIS — F1120 Opioid dependence, uncomplicated: Secondary | ICD-10-CM

## 2014-05-03 DIAGNOSIS — G8929 Other chronic pain: Secondary | ICD-10-CM | POA: Diagnosis present

## 2014-05-03 DIAGNOSIS — D869 Sarcoidosis, unspecified: Secondary | ICD-10-CM | POA: Diagnosis present

## 2014-05-03 DIAGNOSIS — F411 Generalized anxiety disorder: Secondary | ICD-10-CM | POA: Diagnosis present

## 2014-05-03 DIAGNOSIS — R739 Hyperglycemia, unspecified: Secondary | ICD-10-CM | POA: Diagnosis present

## 2014-05-03 DIAGNOSIS — G479 Sleep disorder, unspecified: Secondary | ICD-10-CM | POA: Diagnosis present

## 2014-05-03 DIAGNOSIS — F19939 Other psychoactive substance use, unspecified with withdrawal, unspecified: Principal | ICD-10-CM | POA: Diagnosis present

## 2014-05-03 DIAGNOSIS — F10939 Alcohol use, unspecified with withdrawal, unspecified: Secondary | ICD-10-CM

## 2014-05-03 DIAGNOSIS — Z8249 Family history of ischemic heart disease and other diseases of the circulatory system: Secondary | ICD-10-CM | POA: Diagnosis not present

## 2014-05-03 DIAGNOSIS — K709 Alcoholic liver disease, unspecified: Secondary | ICD-10-CM

## 2014-05-03 DIAGNOSIS — F1910 Other psychoactive substance abuse, uncomplicated: Secondary | ICD-10-CM

## 2014-05-03 DIAGNOSIS — F1210 Cannabis abuse, uncomplicated: Secondary | ICD-10-CM | POA: Diagnosis present

## 2014-05-03 DIAGNOSIS — K7030 Alcoholic cirrhosis of liver without ascites: Secondary | ICD-10-CM

## 2014-05-03 DIAGNOSIS — F1920 Other psychoactive substance dependence, uncomplicated: Secondary | ICD-10-CM | POA: Diagnosis present

## 2014-05-03 DIAGNOSIS — A4289 Other forms of actinomycosis: Secondary | ICD-10-CM | POA: Diagnosis present

## 2014-05-03 HISTORY — DX: Wegener's granulomatosis without renal involvement: M31.30

## 2014-05-03 HISTORY — DX: Personal history of diseases of the blood and blood-forming organs and certain disorders involving the immune mechanism: Z86.2

## 2014-05-03 LAB — ACETAMINOPHEN LEVEL

## 2014-05-03 LAB — CBC
HEMATOCRIT: 39.1 % (ref 36.0–46.0)
HEMOGLOBIN: 13.3 g/dL (ref 12.0–15.0)
MCH: 33.4 pg (ref 26.0–34.0)
MCHC: 34 g/dL (ref 30.0–36.0)
MCV: 98.2 fL (ref 78.0–100.0)
Platelets: 69 10*3/uL — ABNORMAL LOW (ref 150–400)
RBC: 3.98 MIL/uL (ref 3.87–5.11)
RDW: 13.6 % (ref 11.5–15.5)
WBC: 2.4 10*3/uL — AB (ref 4.0–10.5)

## 2014-05-03 LAB — SALICYLATE LEVEL

## 2014-05-03 LAB — LITHIUM LEVEL: Lithium Lvl: <0.25 mEq/L — ABNORMAL LOW (ref 0.80–1.40)

## 2014-05-03 LAB — ETHANOL

## 2014-05-03 LAB — COMPREHENSIVE METABOLIC PANEL
ALBUMIN: 3.5 g/dL (ref 3.5–5.2)
ALK PHOS: 122 U/L — AB (ref 39–117)
ALT: 26 U/L (ref 0–35)
AST: 68 U/L — AB (ref 0–37)
BILIRUBIN TOTAL: 2.2 mg/dL — AB (ref 0.3–1.2)
BUN: 11 mg/dL (ref 6–23)
CHLORIDE: 98 meq/L (ref 96–112)
CO2: 25 meq/L (ref 19–32)
CREATININE: 0.66 mg/dL (ref 0.50–1.10)
Calcium: 8.9 mg/dL (ref 8.4–10.5)
GFR calc Af Amer: >90 mL/min (ref >90)
Glucose, Bld: 206 mg/dL — ABNORMAL HIGH (ref 70–99)
POTASSIUM: 3.9 meq/L (ref 3.7–5.3)
Sodium: 136 mEq/L — ABNORMAL LOW (ref 137–147)
Total Protein: 7.1 g/dL (ref 6.0–8.3)

## 2014-05-03 LAB — RAPID URINE DRUG SCREEN, HOSP PERFORMED
AMPHETAMINES: NOT DETECTED
BARBITURATES: NOT DETECTED
BENZODIAZEPINES: NOT DETECTED
Cocaine: NOT DETECTED
Opiates: NOT DETECTED
Tetrahydrocannabinol: POSITIVE — AB

## 2014-05-03 MED ORDER — IBUPROFEN 200 MG PO TABS: 600.0000 mg | ORAL_TABLET | Freq: Three times a day (TID) | ORAL | Status: DC | PRN

## 2014-05-03 MED ORDER — LORAZEPAM 1 MG PO TABS
0.0000 mg | ORAL_TABLET | Freq: Four times a day (QID) | ORAL | Status: DC
  Administered 2014-05-03 (×3): 2 mg via ORAL
  Filled 2014-05-03 (×3): qty 2

## 2014-05-03 MED ORDER — ZOLPIDEM TARTRATE 5 MG PO TABS: 5.0000 mg | ORAL_TABLET | Freq: Every evening | ORAL | Status: DC | PRN

## 2014-05-03 MED ORDER — FOLIC ACID 1 MG PO TABS
1.0000 mg | ORAL_TABLET | Freq: Every day | ORAL | Status: DC
  Administered 2014-05-03: 1 mg via ORAL
  Filled 2014-05-03: qty 1

## 2014-05-03 MED ORDER — LORAZEPAM 1 MG PO TABS: 1.0000 mg | ORAL_TABLET | Freq: Three times a day (TID) | ORAL | Status: DC | PRN

## 2014-05-03 MED ORDER — ONDANSETRON HCL 4 MG PO TABS
4.0000 mg | ORAL_TABLET | Freq: Three times a day (TID) | ORAL | Status: DC | PRN
  Administered 2014-05-03: 4 mg via ORAL
  Filled 2014-05-03: qty 1

## 2014-05-03 MED ORDER — ADULT MULTIVITAMIN W/MINERALS CH
1.0000 | ORAL_TABLET | Freq: Every day | ORAL | Status: DC
  Administered 2014-05-03: 1 via ORAL
  Filled 2014-05-03: qty 1

## 2014-05-03 MED ORDER — ALBUTEROL SULFATE HFA 108 (90 BASE) MCG/ACT IN AERS: 2.0000 | INHALATION_SPRAY | RESPIRATORY_TRACT | Status: DC | PRN

## 2014-05-03 MED ORDER — LORAZEPAM 1 MG PO TABS: 0.0000 mg | ORAL_TABLET | Freq: Two times a day (BID) | ORAL | Status: DC

## 2014-05-03 MED ORDER — ALUM & MAG HYDROXIDE-SIMETH 200-200-20 MG/5ML PO SUSP
30.0000 mL | ORAL | Status: DC | PRN
Start: 2014-05-03 — End: 2014-05-03

## 2014-05-03 MED ORDER — NICOTINE 21 MG/24HR TD PT24: 21.0000 mg | MEDICATED_PATCH | Freq: Every day | TRANSDERMAL | Status: DC

## 2014-05-03 MED ORDER — DULOXETINE HCL 30 MG PO CPEP
30.0000 mg | ORAL_CAPSULE | Freq: Every day | ORAL | Status: DC
  Administered 2014-05-03: 30 mg via ORAL
  Filled 2014-05-03: qty 1

## 2014-05-03 NOTE — ED Provider Notes (Signed)
CSN: [id number]     Arrival date & time 05/03/14  1323 History   First MD Initiated Contact with Patient 05/03/14 1358     Chief Complaint  Patient presents with  . Medical Clearance     (Consider location/radiation/quality/duration/timing/severity/associated sxs/prior Treatment) HPI Pt presenting with c/o requesting detox from alcohol and multiple other drugs- including narcotics, benzos, cocaine, marijuana.  She states her last drink and substance intake was 2am last night.  She states she has been using heavily for the past 3 months.  She states she tried to come for detox last night but was "too drunk" and left.  She has hx of liver cirrhosis, alcohol withdrawal seizures and DTs in the past.  She has had occasional vomiting over the past few weeks and some productive cough.  Occasional cigarette smoker.  She denies being SI/HI  Past Medical History  Diagnosis Date  . Hypothyroidism   . Blood transfusion July 2012  . Depression   . Anxiety   . Bipolar 1 disorder   . Menorrhagia   . Liver failure   . Bronchitis   . Cirrhosis of liver   . Wegener's granulomatosis    Past Surgical History  Procedure Laterality Date  . Tubal ligation    . Laparoscopy    . Orthopedic surgery    . Cholecystectomy    . Fracture surgery    . Hernia repair    . Vaginal hysterectomy  07/27/2011    Procedure: HYSTERECTOMY VAGINAL;  Surgeon: [name], MD;  Location: [facility];  Service: Gynecology;  Laterality: N/A;  Vaginal Hysterectomy with Right Oophorectomy  . Laparotomy  07/28/2011    Procedure: EXPLORATORY LAPAROTOMY;  Surgeon: [name], MD;  Location: [facility];  Service: Gynecology;  Laterality: N/A;   Family History  Problem Relation Age of Onset  . Diabetes Mother   . Hypertension Mother   . Diabetes Daughter   . Hypertension Daughter    History  Substance Use Topics  . Smoking status: Current Some Day Smoker -- 0.25 packs/day  . Smokeless tobacco: Never Used  . Alcohol Use: Yes    Comment: a fifth of vodka and a 12 pk a day of beer    OB History   Grav Para Term Preterm Abortions TAB SAB Ect Mult Living   4 3 3  0 1     3     Review of Systems ROS reviewed and all otherwise negative except for mentioned in HPI    Allergies  Fluoxetine hcl; Metoclopramide hcl; and Morphine and related  Home Medications   Prior to Admission medications   Medication Sig Start Date End Date Taking? Authorizing Provider  albuterol (PROVENTIL HFA;VENTOLIN HFA) 108 (90 BASE) MCG/ACT inhaler Inhale 1-2 puffs into the lungs every 6 (six) hours as needed for wheezing. 07/24/13 07/24/14 Yes Encarnacion Slates, NP  albuterol (PROVENTIL) (2.5 MG/3ML) 0.083% nebulizer solution Take 3 mLs (2.5 mg total) by nebulization every 6 (six) hours as needed for wheezing. 09/12/13  Yes Nicole Pisciotta, PA-C  carisoprodol (SOMA) 350 MG tablet Take 350 mg by mouth 4 (four) times daily as needed for muscle spasms.   Yes Historical Provider, MD  clonazePAM (KLONOPIN) 0.5 MG tablet Take 0.5 mg by mouth 4 (four) times daily.   Yes Historical Provider, MD  oxyCODONE (OXY IR/ROXICODONE) 5 MG immediate release tablet Take 5 mg by mouth 4 (four) times daily.   Yes Historical Provider, MD  zolpidem (AMBIEN) 5 MG tablet Take 5 mg  by mouth at bedtime as needed for sleep.   Yes Historical Provider, MD   BP 125/86  Pulse 88  Temp(Src) 97.9 F (36.6 C) (Oral)  Resp 18  SpO2 95% Vitals reviewed Physical Exam Physical Examination: General appearance - alert, well appearing, and in no distress Mental status - alert, oriented to person, place, and time Eyes - no conjunctival injection, no scleral icterus Mouth - mucous membranes moist, pharynx normal without lesions Chest - clear to auscultation, no wheezes, rales or rhonchi, symmetric air entry Heart - normal rate, regular rhythm, normal S1, S2, no murmurs, rubs, clicks or gallops Abdomen - soft, nontender, nondistended, no masses or organomegaly Extremities -  peripheral pulses normal, no pedal edema, no clubbing or cyanosis Skin - normal coloration and turgor, no rashes Psych- normal mood and affect  ED Course  Procedures (including critical care time) Labs Review Labs Reviewed  CBC - Abnormal; Notable for the following:    WBC 2.4 (*)    Platelets 69 (*)    All other components within normal limits  COMPREHENSIVE METABOLIC PANEL - Abnormal; Notable for the following:    Sodium 136 (*)    Glucose, Bld 206 (*)    AST 68 (*)    Alkaline Phosphatase 122 (*)    Total Bilirubin 2.2 (*)    All other components within normal limits  SALICYLATE LEVEL - Abnormal; Notable for the following:    Salicylate Lvl <8.0 (*)    All other components within normal limits  URINE RAPID DRUG SCREEN (HOSP PERFORMED) - Abnormal; Notable for the following:    Tetrahydrocannabinol POSITIVE (*)    All other components within normal limits  ACETAMINOPHEN LEVEL  ETHANOL    Imaging Review Dg Chest 2 View  05/03/2014   CLINICAL DATA:  Chronic productive cough and shortness of breath over the past 3 months. Smoker.  EXAM: CHEST  2 VIEW  COMPARISON:  DG CHEST 2 VIEW dated 09/12/2013; DG CHEST 2 VIEW dated 01/04/2013; DG CHEST 2 VIEW dated 09/04/2012; CT ANGIO CHEST W/CM &/OR WO/CM dated 09/01/2012  FINDINGS: Cardiomediastinal silhouette unremarkable and unchanged. Mild to moderate central peribronchial thickening, more so than on the prior examination. Lungs otherwise clear. No localized airspace consolidation. No pleural effusions. No pneumothorax. Normal pulmonary vascularity. Stable mild elevation of the left hemidiaphragm. Visualized bony thorax intact.  IMPRESSION: Mild to moderate changes of acute bronchitis and/or asthma without localized airspace pneumonia.   Electronically Signed   By: Evangeline Dakin M.D.   On: 05/03/2014 15:22     EKG Interpretation None      MDM   Final diagnoses:  Bronchitis  Polysubstance abuse    Pt presenting with request for detox  from multiple substances including alcohol.  She denies SI/HI.  She c/o productive cough and some emesis.  CXR c/w bronchitis, no pneumonia.  Pt will be seen by TTS and evaluated.  Psych holding orders written and CIWA protocol    Threasa Beards, MD 05/03/14 [phone]

## 2014-05-03 NOTE — ED Notes (Signed)
Erin [name]-[phone]

## 2014-05-03 NOTE — Progress Notes (Signed)
Pt's referral has been faxed to [facility] for review, the following facilities are at capacity, do not accept pt's insurance or do not have SA programming:  [facility]- per Venida Jarvis do not have female detox beds right now but pt has Medicaid and they not take pt's with MCD for detox services [facility]- do not accept pts with [facility]- do not accept MCR/MCD  [facility] only [facility]- at capacity per Madolyn Frieze- per [place] beds available but do not accept adult Medicaid [facility]- no programming for [facility]- at capacity per Faythe Casa- no detox [facility]- at [employer]- at capacity per Milus Mallick- no [facility]- do not do primary detox   [facility] Disposition MHT

## 2014-05-03 NOTE — Progress Notes (Signed)
Report called to Kath[facility] Nations on adult unit Pt accepted to bed 304-2

## 2014-05-03 NOTE — ED Notes (Signed)
Pt alert and cooperative. Pt reports anxiety and moderate tremors noted. -SI/HI, -A/[place]. Denies pain. Medication given as ordered. Will continue to monitor closely.

## 2014-05-03 NOTE — ED Notes (Addendum)
Pt requesting "medical clearance letter" so she can go to [facility] for detox from etoh, crack cocaine, benzos, opioids. Last drink at 2am today. Last cocaine, benzo and opioid use yesterday. Sts she has used everything everyday for past 3 months. Hx DTs, withdrawal seizures with previous attempts at detox. Tremors present, +n/v. Denies SI/HI.

## 2014-05-03 NOTE — ED Notes (Signed)
PT AND HER BELONGINGS HAVE BEEN WANDED
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
## 2014-05-03 NOTE — BH Assessment (Signed)
[place] Assessment Progress Note 7414:  [facility] Agent requested efforts to place pt for detox.  [facility] unable to accept pt with Medicare.  Spoke with Verdis Frederickson at [facility], who said all private hospitals full except [facility]     (cannot take pt due to adult [place])  and [place].  Contacted [facility], left message, received return call at 1700 that they only do psychiatric care, not detox. Lurline Idol, LCSW

## 2014-05-04 ENCOUNTER — Encounter (HOSPITAL_COMMUNITY): Payer: Self-pay | Admitting: *Deleted

## 2014-05-04 DIAGNOSIS — F1420 Cocaine dependence, uncomplicated: Secondary | ICD-10-CM

## 2014-05-04 DIAGNOSIS — F1220 Cannabis dependence, uncomplicated: Secondary | ICD-10-CM

## 2014-05-04 DIAGNOSIS — F319 Bipolar disorder, unspecified: Secondary | ICD-10-CM

## 2014-05-04 DIAGNOSIS — F1020 Alcohol dependence, uncomplicated: Secondary | ICD-10-CM

## 2014-05-04 DIAGNOSIS — F1120 Opioid dependence, uncomplicated: Secondary | ICD-10-CM

## 2014-05-04 DIAGNOSIS — F1910 Other psychoactive substance abuse, uncomplicated: Secondary | ICD-10-CM

## 2014-05-04 LAB — HEMOGLOBIN A1C
Hgb A1c MFr Bld: 5.4 % (ref <5.7)
Mean Plasma Glucose: 108 mg/dL (ref <117)

## 2014-05-04 LAB — TSH: TSH: 1.46 u[IU]/mL (ref 0.350–4.500)

## 2014-05-04 MED ORDER — CHLORDIAZEPOXIDE HCL 25 MG PO CAPS
25.0000 mg | ORAL_CAPSULE | Freq: Three times a day (TID) | ORAL | Status: AC
  Administered 2014-05-05 – 2014-05-06 (×3): 25 mg via ORAL
  Filled 2014-05-04 (×3): qty 1

## 2014-05-04 MED ORDER — CHLORDIAZEPOXIDE HCL 25 MG PO CAPS
25.0000 mg | ORAL_CAPSULE | Freq: Four times a day (QID) | ORAL | Status: AC | PRN
  Administered 2014-05-04 – 2014-05-06 (×6): 25 mg via ORAL
  Filled 2014-05-04 (×6): qty 1

## 2014-05-04 MED ORDER — ONDANSETRON 4 MG PO TBDP
4.0000 mg | ORAL_TABLET | Freq: Four times a day (QID) | ORAL | Status: AC | PRN
  Administered 2014-05-04 – 2014-05-06 (×2): 4 mg via ORAL
  Filled 2014-05-04 (×2): qty 1

## 2014-05-04 MED ORDER — ALBUTEROL SULFATE HFA 108 (90 BASE) MCG/ACT IN AERS
2.0000 | INHALATION_SPRAY | RESPIRATORY_TRACT | Status: DC | PRN
Start: 2014-05-04 — End: 2014-05-04

## 2014-05-04 MED ORDER — THIAMINE HCL 100 MG/ML IJ SOLN: 100.0000 mg | Freq: Once | INTRAMUSCULAR | Status: DC

## 2014-05-04 MED ORDER — ONDANSETRON HCL 4 MG PO TABS
4.0000 mg | ORAL_TABLET | Freq: Three times a day (TID) | ORAL | Status: DC | PRN
  Administered 2014-05-05 – 2014-05-10 (×5): 4 mg via ORAL
  Filled 2014-05-04 (×5): qty 1

## 2014-05-04 MED ORDER — DULOXETINE HCL 30 MG PO CPEP
30.0000 mg | ORAL_CAPSULE | Freq: Every day | ORAL | Status: DC
  Administered 2014-05-04 – 2014-05-16 (×13): 30 mg via ORAL
  Filled 2014-05-04 (×4): qty 1
  Filled 2014-05-04: qty 4
  Filled 2014-05-04 (×11): qty 1

## 2014-05-04 MED ORDER — IBUPROFEN 600 MG PO TABS
600.0000 mg | ORAL_TABLET | Freq: Three times a day (TID) | ORAL | Status: DC | PRN
  Administered 2014-05-04 – 2014-05-12 (×8): 600 mg via ORAL
  Filled 2014-05-04 (×5): qty 1
  Filled 2014-05-04: qty 3
  Filled 2014-05-04 (×2): qty 1

## 2014-05-04 MED ORDER — HYDROXYZINE HCL 25 MG PO TABS
25.0000 mg | ORAL_TABLET | Freq: Four times a day (QID) | ORAL | Status: AC | PRN
  Administered 2014-05-05 – 2014-05-06 (×2): 25 mg via ORAL
  Filled 2014-05-04 (×2): qty 1

## 2014-05-04 MED ORDER — CHLORDIAZEPOXIDE HCL 25 MG PO CAPS
25.0000 mg | ORAL_CAPSULE | Freq: Every day | ORAL | Status: AC
  Administered 2014-05-08: 25 mg via ORAL
  Filled 2014-05-04: qty 1

## 2014-05-04 MED ORDER — NICOTINE 21 MG/24HR TD PT24
21.0000 mg | MEDICATED_PATCH | Freq: Every day | TRANSDERMAL | Status: DC
Start: 2014-05-04 — End: 2014-05-06
  Filled 2014-05-04 (×4): qty 1

## 2014-05-04 MED ORDER — ALBUTEROL SULFATE HFA 108 (90 BASE) MCG/ACT IN AERS
2.0000 | INHALATION_SPRAY | RESPIRATORY_TRACT | Status: DC | PRN
  Administered 2014-05-08 – 2014-05-15 (×4): 2 via RESPIRATORY_TRACT
  Filled 2014-05-04: qty 6.7

## 2014-05-04 MED ORDER — ACETAMINOPHEN 325 MG PO TABS: 650.0000 mg | ORAL_TABLET | Freq: Four times a day (QID) | ORAL | Status: DC | PRN

## 2014-05-04 MED ORDER — VITAMIN B-1 100 MG PO TABS
100.0000 mg | ORAL_TABLET | Freq: Every day | ORAL | Status: DC
  Administered 2014-05-05 – 2014-05-16 (×12): 100 mg via ORAL
  Filled 2014-05-04 (×15): qty 1

## 2014-05-04 MED ORDER — CHLORDIAZEPOXIDE HCL 25 MG PO CAPS
25.0000 mg | ORAL_CAPSULE | Freq: Once | ORAL | Status: AC
  Administered 2014-05-04: 25 mg via ORAL
  Filled 2014-05-04: qty 1

## 2014-05-04 MED ORDER — LOPERAMIDE HCL 2 MG PO CAPS: 2.0000 mg | ORAL_CAPSULE | ORAL | Status: AC | PRN

## 2014-05-04 MED ORDER — CHLORDIAZEPOXIDE HCL 25 MG PO CAPS
25.0000 mg | ORAL_CAPSULE | Freq: Four times a day (QID) | ORAL | Status: AC
  Administered 2014-05-04 – 2014-05-05 (×6): 25 mg via ORAL
  Filled 2014-05-04 (×7): qty 1

## 2014-05-04 MED ORDER — MAGNESIUM HYDROXIDE 400 MG/5ML PO SUSP
30.0000 mL | Freq: Every day | ORAL | Status: DC | PRN
  Administered 2014-05-06: 30 mL via ORAL

## 2014-05-04 MED ORDER — CHLORDIAZEPOXIDE HCL 25 MG PO CAPS
25.0000 mg | ORAL_CAPSULE | ORAL | Status: AC
  Administered 2014-05-06 – 2014-05-07 (×2): 25 mg via ORAL
  Filled 2014-05-04 (×2): qty 1

## 2014-05-04 MED ORDER — TRAZODONE HCL 50 MG PO TABS
50.0000 mg | ORAL_TABLET | Freq: Every evening | ORAL | Status: DC | PRN
  Administered 2014-05-05 – 2014-05-08 (×4): 50 mg via ORAL
  Filled 2014-05-04 (×5): qty 1

## 2014-05-04 MED ORDER — ADULT MULTIVITAMIN W/MINERALS CH
1.0000 | ORAL_TABLET | Freq: Every day | ORAL | Status: DC
  Administered 2014-05-04 – 2014-05-16 (×13): 1 via ORAL
  Filled 2014-05-04 (×16): qty 1

## 2014-05-04 NOTE — [facility] Suicide Risk Assessment (Signed)
   Nursing information obtained from:  Patient Demographic factors:  Divorced or widowed;Caucasian;Living alone;Unemployed;Low socioeconomic status Current Mental Status:  NA Loss Factors:  Decline in physical health Historical Factors:  Family history of mental illness or substance abuse Risk Reduction Factors:  Sense of responsibility to family Total Time spent with patient: 45 minutes  CLINICAL FACTORS:   Bipolar Disorder:   Depressive phase Depression:   Anhedonia Comorbid alcohol abuse/dependence Hopelessness Insomnia Recent sense of peace/wellbeing Severe Alcohol/Substance Abuse/Dependencies Chronic Pain Unstable or Poor Therapeutic Relationship Previous Psychiatric Diagnoses and Treatments Medical Diagnoses and Treatments/Surgeries  Psychiatric Specialty Exam: Physical Exam  ROS  Blood pressure 148/98, pulse 94, temperature 97.9 F (36.6 C), temperature source Oral, resp. rate 17, height 5\' 7"  (1.702 m), weight 112.492 kg (248 lb), SpO2 95.00%.Body mass index is 38.83 kg/(m^2).  General Appearance: Disheveled and Guarded  Eye Sport and exercise psychologist::  Fair  Speech:  Clear and Coherent and Slow  Volume:  Decreased  Mood:  Anxious, Depressed, Hopeless and Worthless  Affect:  Depressed and Flat  Thought Process:  Goal Directed  Orientation:  Full (Time, Place, and Person)  Thought Content:  Rumination  Suicidal Thoughts:  No  Homicidal Thoughts:  No  Memory:  Immediate;   Fair  Judgement:  Impaired  Insight:  Lacking  Psychomotor Activity:  Psychomotor Retardation and Restlessness  Concentration:  Fair  Recall:  [place]: Fair  Akathisia:  NA  Handed:  Right  AIMS (if indicated):     Assets:  Communication Skills Desire for [place] Talents/Skills Transportation  Sleep:  Number of Hours: 4.75   Musculoskeletal: Strength & Muscle Tone: within normal limits Gait & Station: normal Patient leans:  N/A  COGNITIVE FEATURES THAT CONTRIBUTE TO RISK:  Closed-mindedness Loss of executive function Polarized thinking    SUICIDE RISK:   Mild:  Suicidal ideation of limited frequency, intensity, duration, and specificity.  There are no identifiable plans, no associated intent, mild dysphoria and related symptoms, good self-control (both objective and subjective assessment), few other risk factors, and identifiable protective factors, including available and accessible social support.  PLAN OF CARE: Admit for polysubstance dependence and detox treatment of alcohol and bipolar disorder medication management.   I certify that inpatient services furnished can reasonably be expected to improve the patient's condition.  Parke Simmers Kang Ishida 05/04/2014, 9:51 AM

## 2014-05-04 NOTE — [facility] Group Notes (Signed)
[facility] LCSW Group Therapy Note   05/04/2014 at 10:00 AM  Type of Therapy and Topic: Group Therapy: Feelings Around Returning Home & Establishing a Supportive Framework and Activity to Identify signs of Improvement or Decompensation   Participation Level:  Did not attend   Catherine C Harrill, LCSW      
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
## 2014-05-04 NOTE — Progress Notes (Signed)
Patient did not attend the evening speaker AA meeting. Pt was notified that group was beginning but remained in bed during the meeting.   

## 2014-05-04 NOTE — Consult Note (Signed)
Case was discussed with NP. Above note reviewed. Agree with above assessment and plan.
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
## 2014-05-04 NOTE — BHH Counselor (Signed)
Adult Psychosocial Assessment Update Interdisciplinary Team  Previous [facility] admissions/discharges:  Admissions Discharges  Date: 7.22.14 Date: 7.30.14  Date: Date:  Date: Date:  Date: Date:  Date: Date:   Changes since the last Psychosocial Assessment (including adherence to outpatient mental health and/or substance abuse treatment, situational issues contributing to decompensation and/or relapse).  Patient reports she is still divorced although now in a relationship with a female significant   Other. She continues to live alone  In her [place] apartment and deal w substance  Abuse issues. She consumes one fifth alcohol along with a 12 pack of beer daily in addi-  tion to several blunts of THC daily, smoking $100 crack and pain pills (Oxys) whenever  Available. Patient reports "bad" cirrhosis and enlarged liver. She presents with deep cough.     Discharge Plan 1. Will you be returning to the same living situation after discharge?   Yes:X No:      If no, what is your plan?    Can return home yet open to other plans; just "too soon to tell right now."       2. Would you like a referral for services when you are discharged? Yes: X    If yes, for what services?  No:       "Somewhere other than [facility] or [facility] as pt reports she   Does not have patience for either one.      Summary and Recommendations (to be completed by the evaluator) Patient is 48 YO divorced disabled caucasian female admitted with diagnosis of   Mood Disorder, Alcohol Dependence, Opoid Abuse, Cocaine Abuse, Sedative, Hypnotic   Or Anxiolytic Abuse. Patient will benefit from crisis stabilization, medication evaluation,  therapy groups for processing thoughts/feelings/experiences, psycho ed groups for   increasing coping skills, and aftercare planning               Signature:  Lyla Glassing, 05/04/2014 3:17 PM

## 2014-05-04 NOTE — Progress Notes (Signed)
Psychoeducational Group Note  Date: 05/04/2014 Time: 0900 Group Topic/Focus:  Gratefulness:  The focus of this group is to help patients identify what two things they are most grateful for in their lives. What helps ground them and to center them on their work to their recovery.  Participation Level: Did not attend  Erin [name]

## 2014-05-04 NOTE — Progress Notes (Signed)
D) Pt has been secluded to her room all day. Denies SI and HI, delusions and hallucinations. States "I am sick. I don't want to kill myself or anyone else. I just want to feel better. I have been drinking a lot for a long time. I just gotta quit it". A) Pt monitored for signs and symptoms of withdrawal. Maintaining 15 checks on Pt and providing her with fluids and a brief 1:1. R) Pt withdrawing from alcohol and is feeling ill.

## 2014-05-04 NOTE — Progress Notes (Signed)
Admission note:  Pt is a 48 year old [place] female admitted to the services of Dr. Sabra Heck for polysubstance dependence.  Pt states that she has been drinking up to a fifth of liquor daily and has had alcohol by her bed at night to drink in case she wakes up tremulous.  Pt has also been using opiates, cocaine, barbituates, and any other substance available to her.  Presently she has bronchitis.  Pt is cooperative with admission process.  She denies SI/HI/hallucinations of any kind.  Pt has been here before and states the last time was "a few years ago".  Pt is cooperative with the admission process.

## 2014-05-04 NOTE — Tx Team (Signed)
Initial Interdisciplinary Treatment Plan  PATIENT STRENGTHS: (choose at least two) Ability for insight Active sense of humor Average or above average intelligence  PATIENT STRESSORS: Health problems Substance abuse   PROBLEM LIST: Problem List/Patient Goals Date to be addressed Date deferred Reason deferred Estimated date of resolution  Polysubstance abuse 05/04/14                                                      DISCHARGE CRITERIA:  Improved stabilization in mood, thinking, and/or behavior Verbal commitment to aftercare and medication compliance Withdrawal symptoms are absent or subacute and managed without 24-hour nursing intervention  PRELIMINARY DISCHARGE PLAN: Attend 12-step recovery group Outpatient therapy Return to previous living arrangement  PATIENT/FAMIILY INVOLVEMENT: This treatment plan has been presented to and reviewed with the patient, Erin [name].  The patient and family have been given the opportunity to ask questions and make suggestions.  Vilma Meckel [facility] 05/04/2014, 12:58 AM

## 2014-05-05 MED ORDER — QUETIAPINE FUMARATE 300 MG PO TABS
300.0000 mg | ORAL_TABLET | Freq: Every day | ORAL | Status: DC
  Administered 2014-05-05 – 2014-05-11 (×7): 300 mg via ORAL
  Filled 2014-05-05 (×9): qty 1

## 2014-05-05 MED ORDER — GABAPENTIN 300 MG PO CAPS
300.0000 mg | ORAL_CAPSULE | Freq: Three times a day (TID) | ORAL | Status: DC
  Administered 2014-05-05 – 2014-05-16 (×33): 300 mg via ORAL
  Filled 2014-05-05: qty 12
  Filled 2014-05-05 (×7): qty 1
  Filled 2014-05-05: qty 12
  Filled 2014-05-05 (×30): qty 1
  Filled 2014-05-05: qty 12
  Filled 2014-05-05 (×2): qty 1

## 2014-05-05 MED ORDER — QUETIAPINE FUMARATE 25 MG PO TABS
25.0000 mg | ORAL_TABLET | Freq: Three times a day (TID) | ORAL | Status: DC
  Administered 2014-05-05 – 2014-05-09 (×12): 25 mg via ORAL
  Filled 2014-05-05 (×15): qty 1

## 2014-05-05 NOTE — BHH Group Notes (Signed)
[facility] LCSW Aftercare Discharge Planning Group Note   05/05/2014 11:11 AM  Participation Quality:  DID NOT ATTEND-pt in room resting/did not come to d/c planning group.   [place]

## 2014-05-05 NOTE — Progress Notes (Signed)
[facility] MD Progress Note  05/05/2014 3:23 PM Erin [name]  MRN:  [id number]  Subjective:  Erin [name] reports, "I feel like I'm in a worse shape today than when I came into the hospital. I'm in a lot of pain, I have tremors. Have not been able to get out of bed or eat. While on the streets, I was shooting up phenergan to prevent me from throwing up my medicines. My blood pressure has been up since I came to the hospital. I need to get back on the medicines that I was discharged on the last time I was here"  Objective:  Patient is seen and chart is reviewed. Patient continues to endorse ongoing high anxiety, depressive symptoms, difficulty sleeping, tremors and increased pain episodes. She is rating her anxiety and  depression at #9 today.  Patient is requesting for her previous medications to be resumed to help combat her current symptoms. She is compliant with his current treatment regimen and has not verbalized any adverse reactions.  Diagnosis:   DSM5: Schizophrenia Disorders:  NA Obsessive-Compulsive Disorders:  NA Trauma-Stressor Disorders:  NA Substance/Addictive Disorders:  Alcohol Related Disorder - Severe (303.90) Depressive Disorders:  NA Total Time spent with patient: 1 hour  Axis I: Alcohol dependence Axis II: Deferred Axis III:  Past Medical History  Diagnosis Date  . Hypothyroidism   . Blood transfusion July 2012  . Depression   . Anxiety   . Bipolar 1 disorder   . Menorrhagia   . Liver failure   . Bronchitis   . Cirrhosis of liver   . Wegener's granulomatosis    Axis IV: other psychosocial or environmental problems and Alcoholism, chronic Axis V: 41-50 serious symptoms  ADL's:  Impaired  Sleep: Reports poor sleep, documention 5.75  Appetite:  "I have not been eating anything due to nausea"  Suicidal Ideation:  Plan:  Denies Intent:  Denies Means:  Denies Homicidal Ideation:  Plan:  Denies Intent:  Denies Means:  Denies AEB (as evidenced  by):  Psychiatric Specialty Exam: Physical Exam  Psychiatric: Her speech is normal. Judgment and thought content normal. Her mood appears anxious. Her affect is not angry, not blunt, not labile and not inappropriate. She is agitated. Cognition and memory are normal. She exhibits a depressed mood.    Review of Systems  Constitutional: Positive for chills and malaise/fatigue.  Eyes: Negative.   Respiratory: Negative.   Cardiovascular: Negative.   Gastrointestinal: Positive for nausea.  Genitourinary: Negative.   Musculoskeletal: Positive for myalgias.  Skin: Negative.   Neurological: Positive for dizziness, tremors, weakness and headaches.  Endo/Heme/Allergies: Negative.   Psychiatric/Behavioral: Positive for depression and substance abuse (Alcohol dependence). Negative for suicidal ideas, hallucinations and memory loss. The patient is nervous/anxious and has insomnia.     Blood pressure 128/87, pulse 88, temperature 97.6 F (36.4 C), temperature source Oral, resp. rate 20, height 5\' 7"  (1.702 m), weight 112.492 kg (248 lb), SpO2 95.00%.Body mass index is 38.83 kg/(m^2).  General Appearance: Disheveled  Eye Sport and exercise psychologist::  Fair  Speech:  Clear and Coherent  Volume:  Normal  Mood:  Anxious, Depressed and Hopeless  Affect:  Congruent and Flat  Thought Process:  Coherent and Intact  Orientation:  Full (Time, Place, and Person)  Thought Content:  Rumination  Suicidal Thoughts:  No  Homicidal Thoughts:  No  Memory:  Immediate;   Good Recent;   Good Remote;   Good  Judgement:  Fair  Insight:  Fair  Psychomotor Activity:  Restlessness and  Tremor  Concentration:  Poor  Recall:  [employer] of Knowledge:Poor  Language: Fair  Akathisia:  No  Handed:  Right  AIMS (if indicated):     Assets:  Desire for Improvement  Sleep:  Number of Hours: 5.25   Musculoskeletal: Strength & Muscle Tone: within normal limits Gait & Station: normal Patient leans: N/A  Current Medications: Current  Facility-Administered Medications  Medication Dose Route Frequency Provider Last Rate Last Dose  . acetaminophen (TYLENOL) tablet 650 mg  650 mg Oral Q6H PRN Dara Hoyer, PA-C      . albuterol (PROVENTIL HFA;VENTOLIN HFA) 108 (90 BASE) MCG/ACT inhaler 2 puff  2 puff Inhalation Q4H PRN Dara Hoyer, PA-C      . chlordiazePOXIDE (LIBRIUM) capsule 25 mg  25 mg Oral Q6H PRN Dara Hoyer, PA-C   25 mg at 05/05/14 0412  . chlordiazePOXIDE (LIBRIUM) capsule 25 mg  25 mg Oral TID Dara Hoyer, PA-C       Followed by  . [START ON 05/06/2014] chlordiazePOXIDE (LIBRIUM) capsule 25 mg  25 mg Oral BH-qamhs Dara Hoyer, PA-C       Followed by  . [START ON 05/08/2014] chlordiazePOXIDE (LIBRIUM) capsule 25 mg  25 mg Oral Daily Dara Hoyer, PA-C      . DULoxetine (CYMBALTA) DR capsule 30 mg  30 mg Oral Daily Dara Hoyer, PA-C   30 mg at 05/05/14 4403  . gabapentin (NEURONTIN) capsule 300 mg  300 mg Oral TID Encarnacion Slates, NP      . hydrOXYzine (ATARAX/VISTARIL) tablet 25 mg  25 mg Oral Q6H PRN Dara Hoyer, PA-C      . ibuprofen (ADVIL,MOTRIN) tablet 600 mg  600 mg Oral Q8H PRN Dara Hoyer, PA-C   600 mg at 05/05/14 4742  . loperamide (IMODIUM) capsule 2-4 mg  2-4 mg Oral PRN Dara Hoyer, PA-C      . magnesium hydroxide (MILK OF MAGNESIA) suspension 30 mL  30 mL Oral Daily PRN Dara Hoyer, PA-C      . multivitamin with minerals tablet 1 tablet  1 tablet Oral Daily Dara Hoyer, PA-C   1 tablet at 05/05/14 5956  . nicotine (NICODERM CQ - dosed in mg/24 hours) patch 21 mg  21 mg Transdermal Daily Dara Hoyer, PA-C      . ondansetron [facility]) tablet 4 mg  4 mg Oral Q8H PRN Dara Hoyer, PA-C      . ondansetron (ZOFRAN-ODT) disintegrating tablet 4 mg  4 mg Oral Q6H PRN Dara Hoyer, PA-C   4 mg at 05/04/14 1814  . QUEtiapine (SEROQUEL) tablet 25 mg  25 mg Oral TID Encarnacion Slates, NP      . QUEtiapine (SEROQUEL) tablet 300 mg  300 mg Oral QHS Encarnacion Slates, NP      .  thiamine (B-1) injection 100 mg  100 mg Intramuscular Once Dara Hoyer, PA-C      . thiamine (VITAMIN B-1) tablet 100 mg  100 mg Oral Daily Dara Hoyer, PA-C   100 mg at 05/05/14 0819  . traZODone (DESYREL) tablet 50 mg  50 mg Oral QHS PRN Dara Hoyer, PA-C        Lab Results:  Results for orders placed during the hospital encounter of 05/03/14 (from the past 48 hour(s))  HEMOGLOBIN A1C     Status: None   Collection Time    05/04/14  6:15 AM  Result Value Ref Range   Hemoglobin A1C 5.4  <5.7 %   Comment: (NOTE)                                                                               According to the ADA Clinical Practice Recommendations for 2011, when     HbA1c is used as a screening test:      >=6.5%   Diagnostic of Diabetes Mellitus               (if abnormal result is confirmed)     5.7-6.4%   Increased risk of developing Diabetes Mellitus     References:Diagnosis and Classification of Diabetes Mellitus,Diabetes     GBTD,1761,60(VPXTG 1):S62-S69 and Standards of Medical Care in             Diabetes - 2011,Diabetes GYIR,4854,62 (Suppl 1):S11-S61.   Mean Plasma Glucose 108  <117 mg/dL   Comment: Performed at [facility]  TSH     Status: None   Collection Time    05/04/14  6:15 AM      Result Value Ref Range   TSH 1.460  0.350 - 4.500 uIU/mL   Comment: Please note change in reference range.     Performed at [facility]    Physical Findings: AIMS: Facial and Oral Movements Muscles of Facial Expression: None, normal Lips and Perioral Area: None, normal Jaw: None, normal Tongue: None, normal,Extremity Movements Upper (arms, wrists, hands, fingers): None, normal Lower (legs, knees, ankles, toes): None, normal, Trunk Movements Neck, shoulders, hips: None, normal, Overall Severity Severity of abnormal movements (highest score from questions above): None, normal Incapacitation due to abnormal movements: None, normal Patient's awareness of  abnormal movements (rate only patient's report): No Awareness, Dental Status Current problems with teeth and/or dentures?: No Does patient usually wear dentures?: No  CIWA:  CIWA-Ar Total: 8 COWS:     Treatment Plan Summary: Daily contact with patient to assess and evaluate symptoms and progress in treatment Medication management  Plan: Review of chart, vital signs, medications, and notes. 1-Individual and group therapy 2-Medication management for depression and anxiety:  Medications reviewed with the patient, re-instated Seroquel 25 mg tid for agitation, 300 mg Q bedtime for mood control and Neurontin 300 mg tid for substance withdrawal syndrome.  3-Coping skills for depression, anxiety, and substance dependency 4-Continue crisis stabilization and management 5-Address health issues--monitoring vital signs. 6-Treatment plan in progress to prevent relapse of depression, substance abuse, and anxiety  Medical Decision Making Problem Points:  Established problem, worsening (2), Review of last therapy session (1) and Review of psycho-social stressors (1) Data Points:  Review of medication regiment & side effects (2) Review of new medications or change in dosage (2)  I certify that inpatient services furnished can reasonably be expected to improve the patient's condition.   Encarnacion Slates, PMHNP-BC, FNP-BC 05/05/2014, 3:23 PM

## 2014-05-05 NOTE — Progress Notes (Signed)
Adult Psychoeducational Group Note  Date:  05/05/2014 Time:  11:51 AM  Group Topic/Focus:  Self Care:   The focus of this group is to help patients understand the importance of self-care in order to improve or restore emotional, physical, spiritual, interpersonal, and financial health.  Participation Level:  Did Not Attend  Additional Comments:  Pt was in bed asleep at the time of group.  Clint Bolder 05/05/2014, 11:51 AM

## 2014-05-05 NOTE — Progress Notes (Signed)
Patient ID: Erin [name], female   DOB: 05/29/1966, 48 y.o.   MRN: [id number] D. The patient has an anxious mood and affect. She spent most of the evening in bed except to get out of bed for medications. Stated that she just got to the hospital today and was too sick to get out of bed to participate in any groups. Tremors noted and stated that she has been feeling anxious A. Met with patient to assess. Encouraged to get out of bed to attend AA group. Administered HS medication. R. The patient had some visible symptoms of withdrawal. Ciwa = 8. The patient was not feeling well enough to attend AA meeting. Compliant with HS medication.

## 2014-05-05 NOTE — BHH Group Notes (Signed)
[facility] LCSW Group Therapy  05/05/2014 3:27 PM  Type of Therapy:  Group Therapy  Participation Level:  Did Not Attend  [place]  05/05/2014, 3:27 PM

## 2014-05-05 NOTE — Progress Notes (Signed)
Patient ID: Erin [name], female   DOB: 02-23-1966, 48 y.o.   MRN: [id number] PER STATE REGULATIONS 482.30  THIS CHART WAS REVIEWED FOR MEDICAL NECESSITY WITH RESPECT TO THE PATIENT'S ADMISSION/ DURATION OF STAY.  NEXT REVIEW DATE: 05/07/2014  Chauncy Lean, RN, BSN CASE MANAGER

## 2014-05-05 NOTE — Care Management Utilization Note (Signed)
Per State Regulation 482.30  The chart was reviewed for necessity with respect to the patient's Admission/ Duration of stay.Admitted 05/03/14.  Next Review Date: 6/33/35  Conception Oms, RN, BSN

## 2014-05-05 NOTE — Tx Team (Signed)
Interdisciplinary Treatment Plan Update (Adult)  Date: 05/05/2014   Time Reviewed: 11:13 AM  Progress in Treatment:  Attending groups: no.  Participating in groups:  No.  Taking medication as prescribed: Yes  Tolerating medication: Yes  Family/Significant othe contact made: Not yet. SPE required for this pt.  Patient understands diagnosis: Yes, AEB seeking treatment for ETOH detox/marijuana abuse, depression/mood stabilization, and medication management.  Discussing patient identified problems/goals with staff: Yes  Medical problems stabilized or resolved: Yes  Denies suicidal/homicidal ideation: Yes during self report.  Patient has not harmed self or Others: Yes  New problem(s) identified:  Discharge Plan or Barriers: Pt currently not attending d/c planning group. According to [facility] notes over weekend, pt wants to return home and followup for o/p med management/therapy (not at [facility] or [place]). Possibly [facility] (CSW assessing).  Additional comments:Female 48 years old was transferred from the ER with c/o of Binging on Alcohol and "every substance out there" She was last admitted for Alcohol dependence to our unit last July. Patient reported drinking 5th of Vodka and 12 beers daily in the last 3 months. She could not quantify what she has been taking as far as drug is concerned. Patient was anxious and informed this writer" my liver is in trouble, I have been binge drinking for 3-4 months and I have been using every drug in the street".  She has a Hx of Bipolar affective d/o but states she has not been taking her medications in the last 3-4 months. Patient has tremors of her finger and was tearful. Patient reported starting to drink at age 41 and started using drugs at age 38. Patient reports a year of sobriety and she went back drinking off and on. Patient was coughing on arrival and she had a chest xray that showed Bronchitis. The EDP who ordered chest xray is  aware of the result. She denies SI/HI/AVH at this time. We will restart her medications and will also use Ativan for her alcohol detox per protocol. Ptient reports alcohol withdrawal seizure in the past we will medically detox her. We will be seeking an admission at any facility with available beds since we are at capacity at this time.   Reason for Continuation of Hospitalization: Librium taper-withdrawals Mood stabilization Medication management  Estimated length of stay: 3-5 days  For review of initial/current patient goals, please see plan of care.  Attendees:  Patient:    Family:    Physician: Carlton Adam MD 05/05/2014 11:11 AM   Nursing: Butch Penny RN  05/05/2014. 11:12 AM   Clinical Social Worker [place], [place]  05/05/2014 11:12 AM   Other: Chrys Racer RN 05/05/2014 11:12 AM   Other:    Other: Gerline Legacy Nurse CM  05/05/2014. 11:12 AM   Other:    Scribe for Treatment Team:  [place] LCSWA 05/05/2014 11:13 AM

## 2014-05-05 NOTE — Progress Notes (Signed)
Patient ID: Erin [name], female   DOB: January 30, 1966, 48 y.o.   MRN: [id number] D: Patient continues to complain of withdrawal symptoms.  Patient has significant tremors and is complaining of constant cravings.  She has been in bed today, only getting up to get her medications.  Patient continues on the librium protocol.  She has not requested any prns today.  Patient denies any SI/HI/AVH today.  A: Continue to monitor medication management and MD orders.  Safety checks completed every 15 minutes per protocol.  R: patient is receptive to staff; her behavior is appropriate to situation.

## 2014-05-06 DIAGNOSIS — F411 Generalized anxiety disorder: Secondary | ICD-10-CM

## 2014-05-06 MED ORDER — MINERAL OIL RE ENEM
1.0000 | ENEMA | Freq: Once | RECTAL | Status: AC
  Administered 2014-05-06: 1 via RECTAL
  Filled 2014-05-06 (×2): qty 1

## 2014-05-06 MED ORDER — LITHIUM CARBONATE 300 MG PO CAPS
300.0000 mg | ORAL_CAPSULE | Freq: Two times a day (BID) | ORAL | Status: DC
  Administered 2014-05-06 – 2014-05-16 (×20): 300 mg via ORAL
  Filled 2014-05-06 (×5): qty 1
  Filled 2014-05-06: qty 8
  Filled 2014-05-06 (×20): qty 1
  Filled 2014-05-06: qty 8
  Filled 2014-05-06: qty 1

## 2014-05-06 NOTE — Progress Notes (Signed)
Patient ID: Erin [name], female   DOB: [birth year], 48 y.o.   MRN: [id number]   D: After the introduction writer asked pt about her day. Pt mentioned a fleet and the possibility of having the writer administer it. Writer informed pt that Probation officer could adm, however gave pt possible positions for inserting. Pt stated she usually lays on her side but has difficulty. Writer also demonstrated how pt could get on her knees. Pt also asked if it was time for her librium. Writer explained librium was given at 1700. Pt then stated, "I don't do drugs, I'm an alcoholic. I got up with the wrong crowd and have been doing opiates, crack and powder. It was getting outta hand". Pt informed Probation officer of her liver disease and asked the earliest she could take her night meds because, "she was planning to go to bed at 2030". Writer encouraged pt to attend group and informed meds would be given after 2100.  A:  Support and encouragement was offered. 15 min checks continued for safety.  R: Pt remains safe.

## 2014-05-06 NOTE — Progress Notes (Signed)
Patient ID: Erin [name], female   DOB: [birth year], 48 y.o.   MRN: [id number] D: Patient has brighter affect this morning.  She is visible on the milieu and is interacting well with staff and her peers. She is not symptomatic for any severe withdrawals.  She presents with tremors and is complaining of constipation.  Patient states that she has good support at home.  She states she is living with someone that wants her to stop drinking.  She states, "he promised to quit drinking [place] (sp.) if I quit drinking."  Patient is concerned about her health problems, specifically her liver.  Patient remains depressed, rating her depression and hopelessness as an 8.  Her appetite remains poor, as well as her sleep. A: Continue to monitor medication management and MD orders.  Safety checks completed every 15 minutes per protocol.  R: patient is receptive to staff; her behavior is appropriate to situation.

## 2014-05-06 NOTE — Progress Notes (Signed)
[facility] MD Progress Note  05/06/2014 11:54 AM STARLET GALLENTINE  MRN:  [id number] Subjective:  [name] states that her use got out of control. States that she is surprised that her UDS did not show opioids, cocaine as she was using heavily. States she got really overwhelmed as states she knows how bad her liver, immune systems are so she does not want to mess herself up any further. She still has her apartment and her car so she does not want to lose those either. States that the combination of medicaions she was taking when she left here last time where very effective. Diagnosis:   DSM5: Schizophrenia Disorders:  none Obsessive-Compulsive Disorders:  none Trauma-Stressor Disorders:  none Substance/Addictive Disorders:  Alcohol Related Disorder - Severe (303.90) and Opioid Disorder - Severe (304.00), Cocaine related disorder, Cannabis related disorder Depressive Disorders:  Major Depressive Disorder - Severe (296.23) Total Time spent with patient: 30 minutes  Axis I: Bipolar, Depressed, Anxiety Disorder NOS  ADL's:  Intact  Sleep: Poor  Appetite:  Fair  Suicidal Ideation:  Plan:  denies Intent:  denies Means:  denies Homicidal Ideation:  Plan:  denies Intent:  denies Means:  denies AEB (as evidenced by):  Psychiatric Specialty Exam: Physical Exam  Review of Systems  Constitutional: Positive for malaise/fatigue.  HENT: Negative.   Eyes: Negative.   Respiratory: Negative.   Cardiovascular: Negative.   Gastrointestinal: Negative.   Genitourinary: Negative.   Musculoskeletal: Negative.   Skin: Negative.   Neurological: Negative.   Endo/Heme/Allergies: Negative.   Psychiatric/Behavioral: Positive for substance abuse. The patient is nervous/anxious.     Blood pressure 113/79, pulse 101, temperature 97.9 F (36.6 C), temperature source Oral, resp. rate 19, height 5\' 7"  (1.702 m), weight 112.492 kg (248 lb), SpO2 95.00%.Body mass index is 38.83 kg/(m^2).  General Appearance:  Disheveled  Eye Sport and exercise psychologist::  Fair  Speech:  Clear and Coherent and Slow  Volume:  Decreased  Mood:  Anxious, Depressed and worried  Affect:  anxious, worried  Thought Process:  Coherent and Goal Directed  Orientation:  Full (Time, Place, and Person)  Thought Content:  symtpoms, worries, concerns  Suicidal Thoughts:  No  Homicidal Thoughts:  No  Memory:  Immediate;   Fair Recent;   Fair Remote;   Fair  Judgement:  Fair  Insight:  Shallow  Psychomotor Activity:  Restlessness  Concentration:  Fair  Recall:  [employer] of Knowledge:NA  Language: Fair  Akathisia:  No  Handed:    AIMS (if indicated):     Assets:  Desire for Improvement Housing  Sleep:  Number of Hours: 5.25   Musculoskeletal: Strength & Muscle Tone: within normal limits Gait & Station: normal Patient leans: N/A  Current Medications: Current Facility-Administered Medications  Medication Dose Route Frequency Provider Last Rate Last Dose  . acetaminophen (TYLENOL) tablet 650 mg  650 mg Oral Q6H PRN Dara Hoyer, PA-C      . albuterol (PROVENTIL HFA;VENTOLIN HFA) 108 (90 BASE) MCG/ACT inhaler 2 puff  2 puff Inhalation Q4H PRN Dara Hoyer, PA-C      . chlordiazePOXIDE (LIBRIUM) capsule 25 mg  25 mg Oral Q6H PRN Dara Hoyer, PA-C   25 mg at 05/06/14 0352  . chlordiazePOXIDE (LIBRIUM) capsule 25 mg  25 mg Oral BH-qamhs Dara Hoyer, PA-C       Followed by  . [START ON 05/08/2014] chlordiazePOXIDE (LIBRIUM) capsule 25 mg  25 mg Oral Daily Dara Hoyer, PA-C      .  DULoxetine (CYMBALTA) DR capsule 30 mg  30 mg Oral Daily Dara Hoyer, PA-C   30 mg at 05/06/14 0755  . gabapentin (NEURONTIN) capsule 300 mg  300 mg Oral TID Encarnacion Slates, NP   300 mg at 05/06/14 1110  . hydrOXYzine (ATARAX/VISTARIL) tablet 25 mg  25 mg Oral Q6H PRN Dara Hoyer, PA-C   25 mg at 05/06/14 0352  . ibuprofen (ADVIL,MOTRIN) tablet 600 mg  600 mg Oral Q8H PRN Dara Hoyer, PA-C   600 mg at 05/05/14 2671  . lithium  carbonate capsule 300 mg  300 mg Oral BID WC Nicholaus Bloom, MD      . loperamide (IMODIUM) capsule 2-4 mg  2-4 mg Oral PRN Dara Hoyer, PA-C      . magnesium hydroxide (MILK OF MAGNESIA) suspension 30 mL  30 mL Oral Daily PRN Dara Hoyer, PA-C   30 mL at 05/06/14 1126  . mineral oil enema 1 enema  1 enema Rectal Once Nicholaus Bloom, MD      . multivitamin with minerals tablet 1 tablet  1 tablet Oral Daily Dara Hoyer, PA-C   1 tablet at 05/06/14 0757  . ondansetron (ZOFRAN) tablet 4 mg  4 mg Oral Q8H PRN Dara Hoyer, PA-C   4 mg at 05/05/14 1621  . ondansetron (ZOFRAN-ODT) disintegrating tablet 4 mg  4 mg Oral Q6H PRN Dara Hoyer, PA-C   4 mg at 05/06/14 0805  . QUEtiapine (SEROQUEL) tablet 25 mg  25 mg Oral TID Encarnacion Slates, NP   25 mg at 05/06/14 1110  . QUEtiapine (SEROQUEL) tablet 300 mg  300 mg Oral QHS Encarnacion Slates, NP   300 mg at 05/05/14 2129  . thiamine (B-1) injection 100 mg  100 mg Intramuscular Once Dara Hoyer, PA-C      . thiamine (VITAMIN B-1) tablet 100 mg  100 mg Oral Daily Dara Hoyer, PA-C   100 mg at 05/06/14 0755  . traZODone (DESYREL) tablet 50 mg  50 mg Oral QHS PRN Dara Hoyer, PA-C   50 mg at 05/05/14 2129    Lab Results: No results found for this or any previous visit (from the past 48 hour(s)).  Physical Findings: AIMS: Facial and Oral Movements Muscles of Facial Expression: None, normal Lips and Perioral Area: None, normal Jaw: None, normal Tongue: None, normal,Extremity Movements Upper (arms, wrists, hands, fingers): None, normal Lower (legs, knees, ankles, toes): None, normal, Trunk Movements Neck, shoulders, hips: None, normal, Overall Severity Severity of abnormal movements (highest score from questions above): None, normal Incapacitation due to abnormal movements: None, normal Patient's awareness of abnormal movements (rate only patient's report): No Awareness, Dental Status Current problems with teeth and/or dentures?:  No Does patient usually wear dentures?: No  CIWA:  CIWA-Ar Total: 6 COWS:     Treatment Plan Summary: Daily contact with patient to assess and evaluate symptoms and progress in treatment Medication management  Plan: Supportive approach/coping skills/relapse prevention  Medical Decision Making Problem Points:  Review of psycho-social stressors (1) Data Points:  Review of medication regiment & side effects (2) Review of new medications or change in dosage (2)  I certify that inpatient services furnished can reasonably be expected to improve the patient's condition.   Nicholaus Bloom 05/06/2014, 11:54 AM

## 2014-05-06 NOTE — BHH Group Notes (Signed)
[facility] Group Notes:  (Nursing/MHT/Case Management/Adjunct)  Date:  05/06/2014  Time: 0900 am  Type of Therapy:  Psychoeducational Skills  Participation Level:  Did Not Attend    Zipporah Plants 05/06/2014, 5:51 PM

## 2014-05-06 NOTE — BHH Group Notes (Signed)
[place] LCSW Group Therapy  05/06/2014 3:24 PM  Type of Therapy:  Group Therapy  Participation Level:  Did Not Attend-pt in hallway but refused to attend group.   Rashay Barnette Smart  LCSWA   05/06/2014, 3:24 PM

## 2014-05-06 NOTE — Progress Notes (Signed)
D   Pt is depressed and anxious   She stayed in her room and did not attend group   She requested to get her bedtime medications at around 8pm   She said all she wanted to do was go to sleep   Pt was tearful at the medication window   She reports feeling hopeless and helpless  A   Verbal support given   Medications administered and effectiveness monitored   Q 15 min checks R   Pt safe at present

## 2014-05-07 MED ORDER — ALBUTEROL SULFATE (2.5 MG/3ML) 0.083% IN NEBU
2.5000 mg | INHALATION_SOLUTION | Freq: Four times a day (QID) | RESPIRATORY_TRACT | Status: DC | PRN
  Administered 2014-05-07 (×2): 2.5 mg via RESPIRATORY_TRACT
  Filled 2014-05-07 (×2): qty 3

## 2014-05-07 MED ORDER — AMOXICILLIN-POT CLAVULANATE 875-125 MG PO TABS
1.0000 | ORAL_TABLET | Freq: Two times a day (BID) | ORAL | Status: AC
  Administered 2014-05-07 – 2014-05-13 (×14): 1 via ORAL
  Filled 2014-05-07 (×17): qty 1

## 2014-05-07 MED ORDER — FLEET ENEMA 7-19 GM/118ML RE ENEM
1.0000 | ENEMA | Freq: Once | RECTAL | Status: AC
  Administered 2014-05-08: 1 via RECTAL
  Filled 2014-05-07: qty 1

## 2014-05-07 MED ORDER — THYROID 30 MG PO TABS
30.0000 mg | ORAL_TABLET | Freq: Every day | ORAL | Status: DC
  Administered 2014-05-08 – 2014-05-16 (×9): 30 mg via ORAL
  Filled 2014-05-07 (×11): qty 1
  Filled 2014-05-07: qty 4

## 2014-05-07 MED ORDER — DM-GUAIFENESIN ER 30-600 MG PO TB12
1.0000 | ORAL_TABLET | Freq: Two times a day (BID) | ORAL | Status: AC
  Administered 2014-05-07 – 2014-05-12 (×10): 1 via ORAL
  Filled 2014-05-07 (×11): qty 1

## 2014-05-07 NOTE — Progress Notes (Signed)
[facility] MD Progress Note  05/07/2014 2:04 PM Erin [name]  MRN:  [id number] Subjective:  Erin [name] is trying to get herself back together. She continues to state that she is in a better situation than she was before she came here the last time. Does not want to lose her apartment or her car. Wants to get back on all her medications and do all the right things.  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  HENT: Positive for congestion.   Eyes: Negative.   Respiratory: Positive for cough.   Cardiovascular: Negative.   Gastrointestinal: Positive for constipation.  Genitourinary: Negative.   Musculoskeletal: Positive for back pain.  Skin: Negative.   Neurological: Negative.   Endo/Heme/Allergies: Negative.   Psychiatric/Behavioral: Positive for depression and substance abuse. The patient is nervous/anxious.     Blood pressure 102/70, pulse 100, temperature 97.8 F (36.6 C), temperature source Oral, resp. rate 20, height 5\' 7"  (1.702 m), weight 112.492 kg (248 lb), SpO2 95.00%.Body mass index is 38.83 kg/(m^2).  General Appearance: Disheveled  Eye Sport and exercise psychologist::  Fair  Speech:  Clear and Coherent, Slow  and nasal congested  Volume:  Decreased  Mood:  Anxious, Depressed and worried, "sick"  Affect:  Restricted  Thought Process:  Coherent and Goal Directed  Orientation:  Full (Time, Place, and Person)  Thought Content:  symtpoms, worries, concerns  Suicidal Thoughts:  No  Homicidal Thoughts:  No  Memory:  Immediate;   Fair Recent;   Fair Remote;   Fair  Judgement:  Fair  Insight:  Present and Shallow  Psychomotor Activity:  Restlessness  Concentration:  Fair  Recall:  [employer] of Knowledge:NA  Language: Fair  Akathisia:  No  Handed:    AIMS (if indicated):     Assets:  Desire for Improvement Housing Transportation  Sleep:  Number of Hours: 6.75   Musculoskeletal: Strength & Muscle Tone: within normal limits Gait & Station: normal Patient leans: N/A  Current Medications: Current Facility-Administered Medications  Medication Dose Route Frequency Provider Last Rate Last Dose  . acetaminophen (TYLENOL) tablet 650 mg  650 mg Oral Q6H PRN Dara Hoyer, PA-C      . albuterol (PROVENTIL HFA;VENTOLIN HFA) 108 (90 BASE) MCG/ACT inhaler 2 puff  2 puff Inhalation Q4H PRN Dara Hoyer, PA-C      . albuterol (PROVENTIL) (2.5 MG/3ML) 0.083% nebulizer solution 2.5 mg  2.5 mg Nebulization Q6H PRN Encarnacion Slates, NP   2.5 mg at 05/07/14 1111  . amoxicillin-clavulanate (AUGMENTIN) 875-125 MG per tablet 1 tablet  1 tablet Oral Q12H Encarnacion Slates, NP   1 tablet at 05/07/14 1134  . [START ON 05/08/2014] chlordiazePOXIDE (LIBRIUM) capsule 25 mg  25 mg Oral Daily Dara Hoyer, PA-C      .  dextromethorphan-guaiFENesin (MUCINEX DM) 30-600 MG per 12 hr tablet 1 tablet  1 tablet Oral BID Encarnacion Slates, NP      . DULoxetine (CYMBALTA) DR capsule 30 mg  30 mg Oral Daily Dara Hoyer, PA-C   30 mg at 05/07/14 0751  . gabapentin (NEURONTIN) capsule 300 mg  300 mg Oral TID Encarnacion Slates, NP   300 mg at 05/07/14 1134  . ibuprofen (ADVIL,MOTRIN) tablet 600 mg  600 mg Oral Q8H PRN Dara Hoyer,  PA-C   600 mg at 05/05/14 0093  . lithium carbonate capsule 300 mg  300 mg Oral BID WC Nicholaus Bloom, MD   300 mg at 05/07/14 0749  . magnesium hydroxide (MILK OF MAGNESIA) suspension 30 mL  30 mL Oral Daily PRN Dara Hoyer, PA-C   30 mL at 05/06/14 1126  . multivitamin with minerals tablet 1 tablet  1 tablet Oral Daily Dara Hoyer, PA-C   1 tablet at 05/07/14 0750  . ondansetron (ZOFRAN) tablet 4 mg  4 mg Oral Q8H PRN Dara Hoyer, PA-C   4 mg at 05/07/14 1136  . QUEtiapine (SEROQUEL) tablet 25 mg  25 mg Oral TID Encarnacion Slates, NP   25 mg at 05/07/14 1134  . QUEtiapine (SEROQUEL) tablet 300 mg  300 mg Oral QHS Encarnacion Slates, NP   300 mg at 05/06/14 2129  . thiamine (B-1) injection 100 mg  100 mg Intramuscular Once Dara Hoyer, PA-C      . thiamine (VITAMIN B-1) tablet 100 mg  100 mg Oral Daily Dara Hoyer, PA-C   100 mg at 05/07/14 0750  . [START ON 05/08/2014] thyroid (ARMOUR) tablet 30 mg  30 mg Oral QAC breakfast Nicholaus Bloom, MD      . traZODone (DESYREL) tablet 50 mg  50 mg Oral QHS PRN Dara Hoyer, PA-C   50 mg at 05/06/14 2129    Lab Results: No results found for this or any previous visit (from the past 48 hour(s)).  Physical Findings: AIMS: Facial and Oral Movements Muscles of Facial Expression: None, normal Lips and Perioral Area: None, normal Jaw: None, normal Tongue: None, normal,Extremity Movements Upper (arms, wrists, hands, fingers): None, normal Lower (legs, knees, ankles, toes): None, normal, Trunk Movements Neck, shoulders, hips: None, normal, Overall Severity Severity of abnormal movements (highest score from questions above): None, normal Incapacitation due to abnormal movements: None, normal Patient's awareness of abnormal movements (rate only patient's report): No Awareness, Dental Status Current problems with teeth and/or dentures?: No Does patient usually wear dentures?: No  CIWA:  CIWA-Ar Total: 4 COWS:     Treatment Plan  Summary: Daily contact with patient to assess and evaluate symptoms and progress in treatment Medication management  Plan: Supportive approach/coping skills/relapser prevention           Complete the detox            Address the sinusitis/bronchitis  Medical Decision Making Problem Points:  New problem, with no additional work-up planned (3) and Review of last therapy session (1) Data Points:  Review of medication regiment & side effects (2) Review of new medications or change in dosage (2)  I certify that inpatient services furnished can reasonably be expected to improve the patient's condition.   Nicholaus Bloom 05/07/2014, 2:04 PM

## 2014-05-07 NOTE — Progress Notes (Signed)
Patient ID: AUSTEN WYGANT, female   DOB: 02-19-1966, 48 y.o.   MRN: [id number]  D: Pt's boyfriend was visiting with pt at the beginning of the shift. Pt and bf asked writer if bf could bring ice cream onto the unit. Writer and other staff informed that outside food could not be brought into the hosp. Pt's bf asked if he could have ice cream delivered. Writer explained the policy to pt and her bf. Later in the shift writer asked pt about her day. Pt was more reserved today than previous day. Stated, "I'm feeling better today".   A:  Support and encouragement was offered. 15 min checks continued for safety.  R: Pt remains safe.

## 2014-05-07 NOTE — BHH Group Notes (Signed)
 [facility] LCSW Aftercare Discharge Planning Group Note   05/07/2014  8:45 AM  Participation Quality:  Did Not Attend - pt sleeping in her room  Regan Lemming, LCSW 05/07/2014 9:24 AM

## 2014-05-07 NOTE — Progress Notes (Signed)
Patient ID: Erin [name], female   DOB: August 13, 1966, 48 y.o.   MRN: [id number] D: Patient continues to improve with her treatment.  Her main complain is severe congestion.  Patient is having difficulty breathing; she was given a prn nebulizer treatment.  Patient has some relief.  She has also been started on an antibiotic for infection.  Patient is less anxious today; her withdrawal symptoms have decreased.  She reports some cravings, tremors and agitation.  She rates her depression and hopelessness as a 7.  She denies any SI/HI/AVH.  Patient is excited about having a car now so she can attend meetings.  A: continue to monitor medication management and MD orders.  Safety checks completed every 15 minutes per protocol.  R: patient is receptive to staff; her behavior is appropriate to situation.

## 2014-05-07 NOTE — Tx Team (Signed)
Interdisciplinary Treatment Plan Update (Adult)  Date: 05/07/2014  Time Reviewed:  9:45 AM  Progress in Treatment: Attending groups: Yes Participating in groups:  Yes Taking medication as prescribed:  Yes Tolerating medication:  Yes Family/Significant othe contact made: CSW assessing Patient understands diagnosis:  Yes Discussing patient identified problems/goals with staff:  Yes Medical problems stabilized or resolved:  Yes Denies suicidal/homicidal ideation: Yes Issues/concerns per patient self-inventory:  Yes Other:  New problem(s) identified: N/A  Discharge Plan or Barriers: CSW assessing for appropriate referrals.    Reason for Continuation of Hospitalization: Anxiety Depression Detox Medication Stabilization  Comments: N/A  Estimated length of stay: 2-3 days  For review of initial/current patient goals, please see plan of care.  Attendees: Patient:     Family:     Physician:  Dr. Sabra Heck 05/07/2014 10:54 AM   Nursing:   Satira Sark, RN 05/07/2014 10:54 AM   Clinical Social Worker:  Regan Lemming, LCSW 05/07/2014 10:54 AM   Other: Lars Pinks, RN case manager 05/07/2014 10:54 AM   Other:  Mayra Neer, RN 05/07/2014 10:54 AM   Other:  Agustina Caroli, NP 05/07/2014 10:54 AM   Other:     Other:    Other:    Other:    Other:    Other:    Other:     Scribe for Treatment Team:   Ane Payment, 05/07/2014 , 10:54 AM

## 2014-05-07 NOTE — BHH Group Notes (Signed)
[place] LCSW Group Therapy  05/07/2014 3:02 PM  Type of Therapy:  Group Therapy  Participation Level:  Did Not Attend -pt in bed resting.   Hersel Mcmeen Smart LCSWA  05/07/2014, 3:02 PM

## 2014-05-08 MED ORDER — ENSURE COMPLETE PO LIQD
237.0000 mL | Freq: Two times a day (BID) | ORAL | Status: DC
  Administered 2014-05-09 – 2014-05-11 (×4): 237 mL via ORAL
  Filled 2014-05-08: qty 237

## 2014-05-08 MED ORDER — CYANOCOBALAMIN 1000 MCG/ML IJ SOLN
1000.0000 ug | Freq: Once | INTRAMUSCULAR | Status: AC
  Administered 2014-05-08: 1000 ug via INTRAMUSCULAR
  Filled 2014-05-08 (×3): qty 1

## 2014-05-08 NOTE — BHH Group Notes (Signed)
[place] LCSW Group Therapy  05/08/2014  1:15 PM   Type of Therapy:  Group Therapy  Participation Level:  Minimal  Participation Quality:  Minimal  Affect:  Depressed and Flat  Cognitive:  Drowsy  Insight:  Limited, Lacking   Engagement in Therapy:  Limited, Lacking  Modes of Intervention:  Clarification, Confrontation, Discussion, Education, Exploration, Limit-setting, Orientation, Problem-solving, Rapport Building, Art therapist, Socialization and Support  Summary of Progress/Problems: The topic for group was balance in life.  Today's group focused on defining balance in one's own words, identifying things that can knock one off balance, and exploring healthy ways to maintain balance in life. Group members were asked to provide an example of a time when they felt off balance, describe how they handled that situation,and process healthier ways to regain balance in the future. Group members were asked to share the most important tool for maintaining balance that they learned while at [facility] and how they plan to apply this method after discharge.  Pt slept through the duration of group and did not participate.    Regan Lemming, LCSW 05/08/2014  2:14 PM

## 2014-05-08 NOTE — Progress Notes (Signed)
Patient ID: Erin [name], female   DOB: [birth year], 48 y.o.   MRN: [id number] She has been up and about but has not attended any groups even with encouragement. Self inventory: depression 8, hopelessness 8, withdrawals of tremors , craving, and agitation. Has not requested any medications for withdrawals. She was given her fleets up use this AM but has not had any results.

## 2014-05-08 NOTE — Progress Notes (Signed)
[facility] MD Progress Note  05/08/2014 5:44 PM Erin [name]  MRN:  [id number] Subjective:  Erin [name] continues to have a hard time. States she is feeling sluggish but agitated. Does not want to decrease the dose of the medications. Understands that she has been off for a while, so it is going to take some time to get used to them. Would rather wait before the dose is adjusted. Still very worried about her physical ailments. She still endorses nausea. States she has been dependence on the Phenergan IM that she self administered Diagnosis:   DSM5: Schizophrenia Disorders:  none Obsessive-Compulsive Disorders:  none Trauma-Stressor Disorders:  none Substance/Addictive Disorders:  Alcohol Related Disorder - Severe (303.90), Cocaine related disorder Depressive Disorders:  Major Depressive Disorder - Moderate (296.22) Total Time spent with patient: 30 minutes  Axis I: Bipolar, Depressed  ADL's:  Intact  Sleep: Fair  Appetite:  Poor  Suicidal Ideation:  Plan:  denies Intent:  denies Means:  denies Homicidal Ideation:  Plan:  denies Intent:  denies Means:  denies AEB (as evidenced by):  Psychiatric Specialty Exam: Physical Exam  Review of Systems  Constitutional: Positive for malaise/fatigue.  HENT: Negative.   Eyes: Negative.   Respiratory: Negative.   Cardiovascular: Negative.   Gastrointestinal: Positive for nausea.  Genitourinary: Negative.   Musculoskeletal: Positive for back pain.  Skin: Negative.   Neurological: Positive for weakness.  Endo/Heme/Allergies: Negative.   Psychiatric/Behavioral: Positive for depression and substance abuse. The patient is nervous/anxious.     Blood pressure 99/76, pulse 91, temperature 98.8 F (37.1 C), temperature source Oral, resp. rate 16, height 5\' 7"  (1.702 m), weight 112.492 kg (248 lb), SpO2 95.00%.Body mass index is 38.83 kg/(m^2).  General Appearance: Fairly Groomed  Engineer, water::  Fair  Speech:  Clear and Coherent  Volume:   Decreased  Mood:  Anxious and Dysphoric  Affect:  Restricted  Thought Process:  Coherent and Goal Directed  Orientation:  Full (Time, Place, and Person)  Thought Content:  symptoms, worries, concerns  Suicidal Thoughts:  No  Homicidal Thoughts:  No  Memory:  Immediate;   Fair Recent;   Fair Remote;   Fair  Judgement:  Fair  Insight:  Present and Shallow  Psychomotor Activity:  Restlessness  Concentration:  Fair  Recall:  [employer] of Knowledge:NA  Language: Fair  Akathisia:  No  Handed:    AIMS (if indicated):     Assets:  Desire for Improvement Housing Transportation  Sleep:  Number of Hours: 6.75   Musculoskeletal: Strength & Muscle Tone: within normal limits Gait & Station: normal Patient leans: N/A  Current Medications: Current Facility-Administered Medications  Medication Dose Route Frequency Provider Last Rate Last Dose  . acetaminophen (TYLENOL) tablet 650 mg  650 mg Oral Q6H PRN Dara Hoyer, PA-C      . albuterol (PROVENTIL HFA;VENTOLIN HFA) 108 (90 BASE) MCG/ACT inhaler 2 puff  2 puff Inhalation Q4H PRN Dara Hoyer, PA-C      . albuterol (PROVENTIL) (2.5 MG/3ML) 0.083% nebulizer solution 2.5 mg  2.5 mg Nebulization Q6H PRN Encarnacion Slates, NP   2.5 mg at 05/07/14 1645  . amoxicillin-clavulanate (AUGMENTIN) 875-125 MG per tablet 1 tablet  1 tablet Oral Q12H Encarnacion Slates, NP   1 tablet at 05/08/14 0818  . cyanocobalamin ((VITAMIN B-12)) injection 1,000 mcg  1,000 mcg Intramuscular Once Nicholaus Bloom, MD      . dextromethorphan-guaiFENesin [facility] Rhonda W DM) 30-600 MG per 12 hr tablet 1 tablet  1 tablet Oral BID Encarnacion Slates, NP   1 tablet at 05/08/14 1730  . DULoxetine (CYMBALTA) DR capsule 30 mg  30 mg Oral Daily Dara Hoyer, PA-C   30 mg at 05/08/14 1884  . gabapentin (NEURONTIN) capsule 300 mg  300 mg Oral TID Encarnacion Slates, NP   300 mg at 05/08/14 1730  . ibuprofen (ADVIL,MOTRIN) tablet 600 mg  600 mg Oral Q8H PRN Dara Hoyer, PA-C   600 mg at 05/08/14  1732  . lithium carbonate capsule 300 mg  300 mg Oral BID WC Nicholaus Bloom, MD   300 mg at 05/08/14 1730  . magnesium hydroxide (MILK OF MAGNESIA) suspension 30 mL  30 mL Oral Daily PRN Dara Hoyer, PA-C   30 mL at 05/06/14 1126  . multivitamin with minerals tablet 1 tablet  1 tablet Oral Daily Dara Hoyer, PA-C   1 tablet at 05/08/14 0818  . ondansetron (ZOFRAN) tablet 4 mg  4 mg Oral Q8H PRN Dara Hoyer, PA-C   4 mg at 05/08/14 1310  . QUEtiapine (SEROQUEL) tablet 25 mg  25 mg Oral TID Encarnacion Slates, NP   25 mg at 05/08/14 1730  . QUEtiapine (SEROQUEL) tablet 300 mg  300 mg Oral QHS Encarnacion Slates, NP   300 mg at 05/07/14 2104  . thiamine (B-1) injection 100 mg  100 mg Intramuscular Once Dara Hoyer, PA-C      . thiamine (VITAMIN B-1) tablet 100 mg  100 mg Oral Daily Dara Hoyer, PA-C   100 mg at 05/08/14 1660  . thyroid (ARMOUR) tablet 30 mg  30 mg Oral QAC breakfast Nicholaus Bloom, MD   30 mg at 05/08/14 0636  . traZODone (DESYREL) tablet 50 mg  50 mg Oral QHS PRN Dara Hoyer, PA-C   50 mg at 05/07/14 2104    Lab Results: No results found for this or any previous visit (from the past 48 hour(s)).  Physical Findings: AIMS: Facial and Oral Movements Muscles of Facial Expression: None, normal Lips and Perioral Area: None, normal Jaw: None, normal Tongue: None, normal,Extremity Movements Upper (arms, wrists, hands, fingers): None, normal Lower (legs, knees, ankles, toes): None, normal, Trunk Movements Neck, shoulders, hips: None, normal, Overall Severity Severity of abnormal movements (highest score from questions above): None, normal Incapacitation due to abnormal movements: None, normal Patient's awareness of abnormal movements (rate only patient's report): No Awareness, Dental Status Current problems with teeth and/or dentures?: No Does patient usually wear dentures?: No  CIWA:  CIWA-Ar Total: 3 COWS:     Treatment Plan Summary: Daily contact with patient to  assess and evaluate symptoms and progress in treatment Medication management  Plan: Supportive approach/coping skills/relapse prevention           Optimize treatment with psychotropics  Medical Decision Making Problem Points:  Established problem, worsening (2) and Review of psycho-social stressors (1) Data Points:  Review of medication regiment & side effects (2) Review of new medications or change in dosage (2)  I certify that inpatient services furnished can reasonably be expected to improve the patient's condition.   Nicholaus Bloom 05/08/2014, 5:44 PM

## 2014-05-08 NOTE — Progress Notes (Signed)
[facility] Group Notes:  (Nursing/MHT/Case Management/Adjunct)  Date:  05/08/2014  Time:  2100 Type of Therapy:  wrap up group  Participation Level:  Active  Participation Quality:  Appropriate, Attentive and Sharing  Affect:  Appropriate and drowsy  Cognitive:  Appropriate  Insight:  Lacking  Engagement in Group:  Engaged  Modes of Intervention:  Clarification, Education and Support  Summary of Progress/Problems:  Erin [name] 05/08/2014, 9:59 PM

## 2014-05-09 MED ORDER — TRAZODONE HCL 100 MG PO TABS
100.0000 mg | ORAL_TABLET | Freq: Every evening | ORAL | Status: DC | PRN
  Administered 2014-05-09 – 2014-05-12 (×4): 100 mg via ORAL
  Filled 2014-05-09 (×4): qty 1

## 2014-05-09 MED ORDER — DOCUSATE SODIUM 100 MG PO CAPS
100.0000 mg | ORAL_CAPSULE | Freq: Two times a day (BID) | ORAL | Status: DC
  Administered 2014-05-09 – 2014-05-16 (×14): 100 mg via ORAL
  Filled 2014-05-09 (×20): qty 1

## 2014-05-09 MED ORDER — POLYETHYLENE GLYCOL 3350 17 G PO PACK
17.0000 g | PACK | Freq: Every day | ORAL | Status: DC
  Administered 2014-05-09 – 2014-05-16 (×8): 17 g via ORAL
  Filled 2014-05-09 (×12): qty 1

## 2014-05-09 MED ORDER — QUETIAPINE FUMARATE 50 MG PO TABS
50.0000 mg | ORAL_TABLET | Freq: Three times a day (TID) | ORAL | Status: DC
  Administered 2014-05-09 – 2014-05-12 (×8): 50 mg via ORAL
  Filled 2014-05-09 (×14): qty 1

## 2014-05-09 NOTE — BHH Group Notes (Signed)
[place] LCSW Group Therapy  05/09/2014 3:18 PM  Type of Therapy:  Group Therapy  Participation Level:  Did Not Attend-pt in room sleeping/did not attend   [place]  05/09/2014, 3:18 PM

## 2014-05-09 NOTE — Progress Notes (Signed)
Assumed care of patient at 2300. Observed resting in bed with eyes closed. No acute distress. RR WNL. Level III obs in place for safety and pt is safe. [name] [name]

## 2014-05-09 NOTE — BHH Suicide Risk Assessment (Signed)
[facility] INPATIENT:  Family/Significant Other Suicide Prevention Education  Suicide Prevention Education:  Patient Refusal for Family/Significant Other Suicide Prevention Education: The patient Erin [name] has refused to provide written consent for family/significant other to be provided Family/Significant Other Suicide Prevention Education during admission and/or prior to discharge.  Physician notified.  SPE completed with pt. SPI pamphlet provided to pt and she was encouraged to share information with support network, ask questions, and talk about any concerns relating to SPE.  [name] Smart LCSWA  05/09/2014, 3:23 PM

## 2014-05-09 NOTE — BHH Group Notes (Signed)
[facility] LCSW Aftercare Discharge Planning Group Note   05/09/2014 10:26 AM  Participation Quality:  Appropriate   Mood/Affect:  Appropriate  Depression Rating:  8  Anxiety Rating:  8  Thoughts of Suicide:  No Will you contract for safety?   NA  Current AVH:  No  Plan for Discharge/Comments:  Pt reports severe withdrawals today. Agreeable to [place] referral.   Transportation Means: unknown at this time.   Supports: boyfriend   Civil engineer, contracting

## 2014-05-09 NOTE — Progress Notes (Signed)
Patient ID: Erin [name], female   DOB: 04-20-1966, 48 y.o.   MRN: [id number]  D: Patient pleasant on approach this am. Still complains of depression and hopelessness feelings at approximately an "8". Currently denies any SI but still reports withdrawal symptoms of cravings, agitation, and tremors. Reports sleep was fair but appetite is still poor. A: Staff will monitor on q 15 minute checks, follow treatment plan, and give medications as ordered. R: Cooperative on the unit. Taking meds without issue

## 2014-05-09 NOTE — Progress Notes (Signed)
[facility] MD Progress Note  05/09/2014 5:53 PM Erin [name]  MRN:  [id number] Subjective:  Erin [name] still endorses feeling agitated. The Seroquel helps some but does not last that long. She admits that she worries maybe too much about her physical symptoms. 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 Concerned about relapsing. Concerned about her constipation Diagnosis:   DSM5: Schizophrenia Disorders:  denies Obsessive-Compulsive Disorders:  deniee Trauma-Stressor Disorders:  denies Substance/Addictive Disorders:  Alcohol Related Disorder - Severe (303.90) and Opioid Disorder - Severe (304.00), Cocaine use disorder Depressive Disorders:  Major Depressive Disorder - Moderate (296.22) Total Time spent with patient: 30 minutes  Axis I: Bipolar, Depressed and Substance Induced Mood Disorder  ADL's:  Intact  Sleep: Fair  Appetite:  Poor  Suicidal Ideation:  Plan:  denies Intent:  denies Means:  denies Homicidal Ideation:  Plan:  denies Intent:  denies Means:  denies AEB (as evidenced by):  Psychiatric Specialty Exam: Physical Exam  Review of Systems  Constitutional: Positive for malaise/fatigue.  HENT: Negative.   Eyes: Negative.   Respiratory: Negative.   Cardiovascular: Negative.   Gastrointestinal: Positive for constipation.  Genitourinary: Negative.   Musculoskeletal: Positive for back pain.  Skin: Negative.   Neurological: Negative.   Endo/Heme/Allergies: Negative.   Psychiatric/Behavioral: Positive for depression and substance abuse. The patient is nervous/anxious.     Blood pressure 101/69, pulse 81, temperature 97.5 F (36.4 C), temperature source Oral, resp. rate 20, height 5\' 7"  (1.702 m), weight 112.492 kg (248 lb), SpO2 95.00%.Body mass index is 38.83 kg/(m^2).  General Appearance: Fairly Groomed  Engineer, water::  Fair  Speech:  Clear and Coherent  Volume:  fluctuates  Mood:  Anxious, Depressed and worried   Affect:  anxious, worried  Thought Process:  Coherent and Goal Directed  Orientation:  Full (Time, Place, and Person)  Thought Content:  symtpms, worries, concerns  Suicidal Thoughts:  No  Homicidal Thoughts:  No  Memory:  Immediate;   Fair Recent;   Fair Remote;   Fair  Judgement:  Fair  Insight:  Present and Shallow  Psychomotor Activity:  Restlessness  Concentration:  Fair  Recall:  [place]: Fair  Akathisia:  No  Handed:    AIMS (if indicated):     Assets:  Desire for Improvement Housing Social Support Transportation  Sleep:  Number of Hours: 6.75   Musculoskeletal: Strength & Muscle Tone: within normal limits Gait & Station: normal Patient leans: N/A  Current Medications: Current Facility-Administered Medications  Medication Dose Route Frequency Provider Last Rate Last Dose  . acetaminophen (TYLENOL) tablet 650 mg  650 mg Oral Q6H PRN Dara Hoyer, PA-C      . albuterol (PROVENTIL HFA;VENTOLIN HFA) 108 (90 BASE) MCG/ACT inhaler 2 puff  2 puff Inhalation Q4H PRN Dara Hoyer, PA-C   2 puff at 05/09/14 [phone]  . albuterol (PROVENTIL) (2.5 MG/3ML) 0.083% nebulizer solution 2.5 mg  2.5 mg Nebulization Q6H PRN Encarnacion Slates, NP   2.5 mg at 05/07/14 1645  . amoxicillin-clavulanate (AUGMENTIN) 875-125 MG per tablet 1 tablet  1 tablet Oral Q12H Encarnacion Slates, NP   1 tablet at 05/09/14 0802  . dextromethorphan-guaiFENesin (MUCINEX DM) 30-600 MG per 12 hr tablet 1 tablet  1 tablet Oral BID Encarnacion Slates, NP   1 tablet at 05/09/14 1652  . docusate sodium (COLACE) capsule 100 mg  100 mg Oral BID Nicholaus Bloom, MD  100 mg at 05/09/14 1652  . DULoxetine (CYMBALTA) DR capsule 30 mg  30 mg Oral Daily Dara Hoyer, PA-C   30 mg at 05/09/14 4098  . feeding supplement (ENSURE COMPLETE) (ENSURE COMPLETE) liquid 237 mL  237 mL Oral BID BM Nicholaus Bloom, MD   237 mL at 05/09/14 1106  . gabapentin (NEURONTIN) capsule 300 mg  300 mg Oral TID Encarnacion Slates, NP   300 mg at 05/09/14 1652  . ibuprofen (ADVIL,MOTRIN) tablet 600 mg  600 mg Oral Q8H PRN Dara Hoyer, PA-C   600 mg at 05/08/14 1732  . lithium carbonate capsule 300 mg  300 mg Oral BID WC Nicholaus Bloom, MD   300 mg at 05/09/14 1653  . magnesium hydroxide (MILK OF MAGNESIA) suspension 30 mL  30 mL Oral Daily PRN Dara Hoyer, PA-C   30 mL at 05/06/14 1126  . multivitamin with minerals tablet 1 tablet  1 tablet Oral Daily Dara Hoyer, PA-C   1 tablet at 05/09/14 1191  . ondansetron (ZOFRAN) tablet 4 mg  4 mg Oral Q8H PRN Dara Hoyer, PA-C   4 mg at 05/08/14 1310  . polyethylene glycol (MIRALAX / GLYCOLAX) packet 17 g  17 g Oral Daily Nicholaus Bloom, MD      . QUEtiapine (SEROQUEL) tablet 300 mg  300 mg Oral QHS Encarnacion Slates, NP   300 mg at 05/08/14 2118  . QUEtiapine (SEROQUEL) tablet 50 mg  50 mg Oral TID Nicholaus Bloom, MD   50 mg at 05/09/14 1653  . thiamine (B-1) injection 100 mg  100 mg Intramuscular Once Dara Hoyer, PA-C      . thiamine (VITAMIN B-1) tablet 100 mg  100 mg Oral Daily Dara Hoyer, PA-C   100 mg at 05/09/14 4782  . thyroid (ARMOUR) tablet 30 mg  30 mg Oral QAC breakfast Nicholaus Bloom, MD   30 mg at 05/09/14 0610  . traZODone (DESYREL) tablet 100 mg  100 mg Oral QHS PRN Nicholaus Bloom, MD        Lab Results: No results found for this or any previous visit (from the past 48 hour(s)).  Physical Findings: AIMS: Facial and Oral Movements Muscles of Facial Expression: None, normal Lips and Perioral Area: None, normal Jaw: None, normal Tongue: None, normal,Extremity Movements Upper (arms, wrists, hands, fingers): None, normal Lower (legs, knees, ankles, toes): None, normal, Trunk Movements Neck, shoulders, hips: None, normal, Overall Severity Severity of abnormal movements (highest score from questions above): None, normal Incapacitation due to abnormal movements: None, normal Patient's awareness of abnormal movements (rate only patient's  report): No Awareness, Dental Status Current problems with teeth and/or dentures?: No Does patient usually wear dentures?: No  CIWA:  CIWA-Ar Total: 4 COWS:     Treatment Plan Summary: Daily contact with patient to assess and evaluate symptoms and progress in treatment Medication management  Plan: Supportive approach/copign skills/relapse prevention           Reassess and address the co morbidities           Increase the Seroquel to 50 mg TID           Colase 100 mg BID, Miralax 17 gm daily Medical Decision Making Problem Points:  Established problem, worsening (2) and Review of psycho-social stressors (1) Data Points:  Review of medication regiment & side effects (2)  I certify that inpatient services furnished can reasonably be expected  to improve the patient's condition.   Nicholaus Bloom 05/09/2014, 5:53 PM

## 2014-05-09 NOTE — Tx Team (Signed)
Interdisciplinary Treatment Plan Update (Adult)  Date: 05/09/2014   Time Reviewed: 11:54 AM   Progress in Treatment: Attending groups: Intermittently  Participating in groups:  Yes, when she attends  Taking medication as prescribed:  Yes Tolerating medication:  Yes Family/Significant othe contact made: CSW assessing Patient understands diagnosis:  Yes Discussing patient identified problems/goals with staff:  Yes Medical problems stabilized or resolved:  Yes Denies suicidal/homicidal ideation: Yes Issues/concerns per patient self-inventory:  Yes Other:  New problem(s) identified: N/A  Discharge Plan or Barriers: Pt refusing referral to [facility], [facility], [facility] o/p, and Dr. Marquis Buggy office. Pt agreeable to assessment through the [facility].  She plans to return home at d/c.   Reason for Continuation of Hospitalization: Anxiety Depression Medication Stabilization  Comments: N/A  Estimated length of stay: 2-3 days  For review of initial/current patient goals, please see plan of care.  Attendees: Patient:     Family:     Physician:  Dr. Sabra Heck 05/09/2014 11:54 AM   Nursing:   Karsten Fells RN  05/09/2014 11:54 AM   Clinical Social Worker: Maxie Better, [place]   05/09/2014 11:54 AM   Other: Lars Pinks, RN case manager 05/09/2014 11:54 AM   Other:  Oletta Darter RN 05/09/2014 11:54 AM   Other:  Agustina Caroli, NP 05/09/2014 11:54 AM   Other:     Other:    Other:    Other:    Other:    Other:    Other:     Scribe for Treatment Team:   Maxie Better, [place] 05/09/2014 11:54 AM

## 2014-05-09 NOTE — Progress Notes (Signed)
Adult Psychoeducational Group Note  Date:  05/09/2014 Time:  10:02 PM  Group Topic/Focus:  Wrap-Up Group:   The focus of this group is to help patients review their daily goal of treatment and discuss progress on daily workbooks.  Participation Level:  Did Not Attend    Additional Comments: Tonight's group was an [place] meeting pt. declined to attend.  Laure Kidney [id number]/9675, 10:02 PM

## 2014-05-09 NOTE — Progress Notes (Signed)
D.  Pt pleasant on approach, complaint of continued constipation.   Pt states no BM in eight days.  Pt has had enemas times two as well as miirlax this shift.  Pt did not attend evening AA group, went to room and to lay down.  Interacting appropriately within milieu.  A.  Support and encouragement offered, encouraged Pt to ask doctor for mag citrate in AM if she still has not had a bowl movement.  R.  Pt remains safe on unit, will continue to monitor.

## 2014-05-09 NOTE — Progress Notes (Signed)
Adult Psychoeducational Group Note  Date:  05/09/2014 Time:  10:45 AM  Group Topic/Focus:  Early Warning Signs:   The focus of this group is to help patients identify signs or symptoms they exhibit before slipping into an unhealthy state or crisis.  Participation Level:  Did Not Attend   Clint Bolder 05/09/2014, 10:45 AM

## 2014-05-10 DIAGNOSIS — F1994 Other psychoactive substance use, unspecified with psychoactive substance-induced mood disorder: Secondary | ICD-10-CM

## 2014-05-10 DIAGNOSIS — F3130 Bipolar disorder, current episode depressed, mild or moderate severity, unspecified: Secondary | ICD-10-CM

## 2014-05-10 MED ORDER — MAGNESIUM CITRATE PO SOLN
1.0000 | Freq: Once | ORAL | Status: AC
  Administered 2014-05-10: 1 via ORAL

## 2014-05-10 MED ORDER — PROPRANOLOL HCL 10 MG PO TABS
10.0000 mg | ORAL_TABLET | Freq: Three times a day (TID) | ORAL | Status: DC
  Administered 2014-05-10 – 2014-05-13 (×7): 10 mg via ORAL
  Filled 2014-05-10 (×14): qty 1

## 2014-05-10 MED ORDER — BISACODYL 10 MG RE SUPP
RECTAL | Status: AC
  Filled 2014-05-10: qty 1

## 2014-05-10 MED ORDER — BISACODYL 10 MG RE SUPP
10.0000 mg | Freq: Once | RECTAL | Status: AC
  Administered 2014-05-10: 10 mg via RECTAL
  Filled 2014-05-10 (×2): qty 1

## 2014-05-10 NOTE — Progress Notes (Signed)
D.  Pt pleasant on approach, only complaint continued abdominal pain, Pt states she has liver pain.  Pt does want Dr. Sabra Heck to consider ordering more tests on her abdomen.  Pt did not attend evening AA group.  Interacting appropriately with peers on unit.  A.  Support and encouragement offered  R.  Pt remains safe on unit, will continue to monitor.

## 2014-05-10 NOTE — Progress Notes (Signed)
Patient ID: LAVIDA PATCH, female   DOB: 1966-02-20, 48 y.o.   MRN: [id number]  D: Pt has been very flat and depressed on the unit today, pt reported that she felt bad due to not having a bowel movement in 8 days. Aggie NP made aware, new medication was ordered. Pt reported on her self inventory sheet that her depression was a 8, and her hopelessness was a 8.  Pt reported being negative SI/HI, no AH/VH noted. A: 15 min checks continued for patient safety. R: Pt safety maintained.

## 2014-05-10 NOTE — Progress Notes (Signed)
Patient ID: Erin [name], female   DOB: Dec 18, 1966, 48 y.o.   MRN: [id number] [facility] MD Progress Note  05/10/2014 3:03 PM Erin [name]  MRN:  [id number]  Subjective:  Erin [name] still endorses feeling agitated. Says she has not used the bathroom in 8 day (constipation). She states she was using all kinds of drugs there is out there. And normally 3 days after detox she feels agitated, ansy and anxious. Erin [name] is saying today that she feels like she is getting to that point today.  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  HENT: Negative.   Eyes: Negative.   Respiratory: Negative.   Cardiovascular: Negative.   Gastrointestinal: Positive for constipation.  Genitourinary: Negative.   Musculoskeletal: Positive for back pain.  Skin: Negative.   Neurological: Negative.   Endo/Heme/Allergies: Negative.   Psychiatric/Behavioral: Positive for depression and substance abuse. The patient is nervous/anxious.     Blood pressure 99/69, pulse 81, temperature 97.5 F (36.4 C), temperature source Oral, resp. rate 16, height 5\' 7"  (1.702 m), weight 112.492 kg (248 lb), SpO2 95.00%.Body mass index is 38.83 kg/(m^2).  General Appearance: Fairly Groomed  Engineer, water::  Fair  Speech:   Clear and Coherent  Volume:  fluctuates  Mood:  Anxious, Depressed and worried  Affect:  anxious, worried  Thought Process:  Coherent and Goal Directed  Orientation:  Full (Time, Place, and Person)  Thought Content:  symtpms, worries, concerns  Suicidal Thoughts:  No  Homicidal Thoughts:  No  Memory:  Immediate;   Fair Recent;   Fair Remote;   Fair  Judgement:  Fair  Insight:  Present and Shallow  Psychomotor Activity:  Restlessness  Concentration:  Fair  Recall:  [place]: Fair  Akathisia:  No  Handed:    AIMS (if indicated):     Assets:  Desire for Improvement Housing Social Support Transportation  Sleep:  Number of Hours: 6.75   Musculoskeletal: Strength & Muscle Tone: within normal limits Gait & Station: normal Patient leans: N/A  Current Medications: Current Facility-Administered Medications  Medication Dose Route Frequency Provider Last Rate Last Dose  . acetaminophen (TYLENOL) tablet 650 mg  650 mg Oral Q6H PRN Dara Hoyer, PA-C      . albuterol (PROVENTIL HFA;VENTOLIN HFA) 108 (90 BASE) MCG/ACT inhaler 2 puff  2 puff Inhalation Q4H PRN Dara Hoyer, PA-C   2 puff at 05/09/14 [phone]  . albuterol (PROVENTIL) (2.5 MG/3ML) 0.083% nebulizer solution 2.5 mg  2.5 mg Nebulization Q6H PRN Encarnacion Slates, NP   2.5 mg at 05/07/14 1645  . amoxicillin-clavulanate (AUGMENTIN) 875-125 MG per tablet 1 tablet  1 tablet Oral Q12H Encarnacion Slates, NP   1 tablet at 05/10/14 0732  . bisacodyl (DULCOLAX) suppository 10 mg  10 mg Rectal Once Encarnacion Slates, NP      .  dextromethorphan-guaiFENesin (MUCINEX DM) 30-600 MG per 12 hr tablet 1 tablet  1 tablet Oral BID Encarnacion Slates, NP   1 tablet at 05/10/14 0732  . docusate sodium (COLACE) capsule 100 mg  100 mg Oral BID Nicholaus Bloom, MD   100 mg at 05/10/14 0732  . DULoxetine (CYMBALTA) DR capsule 30 mg  30 mg Oral Daily Dara Hoyer, PA-C   30 mg at 05/10/14 2130  . feeding supplement (ENSURE COMPLETE)  (ENSURE COMPLETE) liquid 237 mL  237 mL Oral BID BM Nicholaus Bloom, MD   237 mL at 05/10/14 1121  . gabapentin (NEURONTIN) capsule 300 mg  300 mg Oral TID Encarnacion Slates, NP   300 mg at 05/10/14 1121  . ibuprofen (ADVIL,MOTRIN) tablet 600 mg  600 mg Oral Q8H PRN Dara Hoyer, PA-C   600 mg at 05/09/14 2137  . lithium carbonate capsule 300 mg  300 mg Oral BID WC Nicholaus Bloom, MD   300 mg at 05/10/14 0732  . magnesium hydroxide (MILK OF MAGNESIA) suspension 30 mL  30 mL Oral Daily PRN Dara Hoyer, PA-C   30 mL at 05/06/14 1126  . multivitamin with minerals tablet 1 tablet  1 tablet Oral Daily Dara Hoyer, PA-C   1 tablet at 05/10/14 0732  . ondansetron (ZOFRAN) tablet 4 mg  4 mg Oral Q8H PRN Dara Hoyer, PA-C   4 mg at 05/10/14 1124  . polyethylene glycol (MIRALAX / GLYCOLAX) packet 17 g  17 g Oral Daily Nicholaus Bloom, MD   17 g at 05/10/14 0732  . QUEtiapine (SEROQUEL) tablet 300 mg  300 mg Oral QHS Encarnacion Slates, NP   300 mg at 05/09/14 2137  . QUEtiapine (SEROQUEL) tablet 50 mg  50 mg Oral TID Nicholaus Bloom, MD   50 mg at 05/10/14 1121  . thiamine (B-1) injection 100 mg  100 mg Intramuscular Once Dara Hoyer, PA-C      . thiamine (VITAMIN B-1) tablet 100 mg  100 mg Oral Daily Dara Hoyer, PA-C   100 mg at 05/10/14 0732  . thyroid (ARMOUR) tablet 30 mg  30 mg Oral QAC breakfast Nicholaus Bloom, MD   30 mg at 05/10/14 0602  . traZODone (DESYREL) tablet 100 mg  100 mg Oral QHS PRN Nicholaus Bloom, MD   100 mg at 05/09/14 2137    Lab Results: No results found for this or any previous visit (from the past 48 hour(s)).  Physical Findings: AIMS: Facial and Oral Movements Muscles of Facial Expression: None, normal Lips and Perioral Area: None, normal Jaw: None, normal Tongue: None, normal,Extremity Movements Upper (arms, wrists, hands, fingers): None, normal Lower (legs, knees, ankles, toes): None, normal, Trunk Movements Neck, shoulders, hips: None, normal, Overall  Severity Severity of abnormal movements (highest score from questions above): None, normal Incapacitation due to abnormal movements: None, normal Patient's awareness of abnormal movements (rate only patient's report): No Awareness, Dental Status Current problems with teeth and/or dentures?: No Does patient usually wear dentures?: No  CIWA:  CIWA-Ar Total: 4 COWS:     Treatment Plan Summary: Daily contact with patient to assess and evaluate symptoms and progress in treatment Medication management  Plan: Supportive approach/copign skills/relapse prevention Reassess and address the co morbidities Continue Seroquel to 50 mg TID, Initiate Propranolol 10 mg tid for anxiety Continue Colace 100 mg BID, Miralax 17 gm daily Add magnesium Citrate 1 bottle once, Dulcolax supp 10  mg once. Discharge plans in progress.  Medical Decision Making Problem Points:  Established problem, worsening (2) and Review of psycho-social stressors (1) Data Points:  Review of medication regiment & side effects (2)  I certify that inpatient services furnished can reasonably be expected to improve the patient's condition.   Encarnacion Slates, PMHNP 05/10/2014, 3:03 PM  I agreed with the findings, treatment and disposition plan of this patient. Berniece Andreas, MD

## 2014-05-10 NOTE — BHH Group Notes (Signed)
[facility] LCSW Group Therapy Note  05/10/2014 10:00 AM  Type of Therapy and Topic:  Group Therapy: Avoiding Self-Sabotaging and Enabling Behaviors  Participation Level:  Did Not Attend    Sheilah Pigeon, LCSW

## 2014-05-10 NOTE — Progress Notes (Signed)
Adult Psychoeducational Group Note  Date:  05/10/2014 Time:  7:27 PM  Group Topic/Focus:  Therapeutic Activity   Participation Level:  Did Not Attend  Elisha Headland 05/10/2014, 7:27 PM

## 2014-05-10 NOTE — BHH Group Notes (Signed)
[place] Group Notes:  (Nursing/MHT/Case Management/Adjunct)  Date:  05/10/2014  Time:  11:31 AM  Type of Therapy:  Psychoeducational Skills  Participation Level:  Did Not Attend  Erin [name] Haig Gerardo 05/10/2014, 11:31 AM

## 2014-05-10 NOTE — Progress Notes (Signed)
Adult Psychoeducational Group Note  Date:  05/10/2014 Time:  11:55 PM  Group Topic/Focus:  Wrap-Up Group:   The focus of this group is to help patients review their daily goal of treatment and discuss progress on daily workbooks.  Participation Level:  Did Not Attend   Additional Comments:  Tonight we had a speaker from [place] and pt did not attend. She showered and stayed in her room instead. She reported that she did not want to attend.  Arkie Tagliaferro A Fey Coghill 05/10/2014, 11:55 PM

## 2014-05-11 ENCOUNTER — Inpatient Hospital Stay (HOSPITAL_COMMUNITY): Payer: Medicare Other

## 2014-05-11 ENCOUNTER — Encounter (HOSPITAL_COMMUNITY): Payer: Self-pay | Admitting: Emergency Medicine

## 2014-05-11 LAB — CBC WITH DIFFERENTIAL/PLATELET
BASOS ABS: 0 10*3/uL (ref 0.0–0.1)
Basophils Relative: 1 % (ref 0–1)
Eosinophils Absolute: 0.1 10*3/uL (ref 0.0–0.7)
Eosinophils Relative: 3 % (ref 0–5)
HCT: 38.9 % (ref 36.0–46.0)
Hemoglobin: 12.8 g/dL (ref 12.0–15.0)
Lymphs Abs: 0.5 10*3/uL — ABNORMAL LOW (ref 0.7–4.0)
MCH: 33.3 pg (ref 26.0–34.0)
MCHC: 32.9 g/dL (ref 30.0–36.0)
MCV: 101.3 fL — ABNORMAL HIGH (ref 78.0–100.0)
MONO ABS: 0.2 10*3/uL (ref 0.1–1.0)
Monocytes Relative: 9 % (ref 3–12)
NEUTROS ABS: 1 10*3/uL — AB (ref 1.7–7.7)
Neutrophils Relative %: 57 % (ref 43–77)
Platelets: 92 10*3/uL — ABNORMAL LOW (ref 150–400)
RBC: 3.84 MIL/uL — AB (ref 3.87–5.11)
RDW: 13.2 % (ref 11.5–15.5)
WBC: 1.8 10*3/uL — ABNORMAL LOW (ref 4.0–10.5)

## 2014-05-11 LAB — COMPREHENSIVE METABOLIC PANEL
ALT: 34 U/L (ref 0–35)
AST: 54 U/L — ABNORMAL HIGH (ref 0–37)
Albumin: 3.4 g/dL — ABNORMAL LOW (ref 3.5–5.2)
Alkaline Phosphatase: 130 U/L — ABNORMAL HIGH (ref 39–117)
BUN: 13 mg/dL (ref 6–23)
CALCIUM: 10.4 mg/dL (ref 8.4–10.5)
CO2: 32 mEq/L (ref 19–32)
Chloride: 99 mEq/L (ref 96–112)
Creatinine, Ser: 0.9 mg/dL (ref 0.50–1.10)
GFR calc Af Amer: 86 mL/min — ABNORMAL LOW (ref >90)
GFR calc non Af Amer: 74 mL/min — ABNORMAL LOW (ref >90)
Glucose, Bld: 107 mg/dL — ABNORMAL HIGH (ref 70–99)
Potassium: 4.8 mEq/L (ref 3.7–5.3)
Sodium: 137 mEq/L (ref 137–147)
TOTAL PROTEIN: 7.3 g/dL (ref 6.0–8.3)
Total Bilirubin: 1.1 mg/dL (ref 0.3–1.2)

## 2014-05-11 LAB — LIPASE, BLOOD: LIPASE: 266 U/L — AB (ref 11–59)

## 2014-05-11 MED ORDER — FLEET ENEMA 7-19 GM/118ML RE ENEM
1.0000 | ENEMA | Freq: Once | RECTAL | Status: AC
  Administered 2014-05-11: 10:00:00 via RECTAL
  Filled 2014-05-11: qty 1

## 2014-05-11 MED ORDER — MILK AND MOLASSES ENEMA
1.0000 | Freq: Once | RECTAL | Status: AC
  Administered 2014-05-11: 250 mL via RECTAL
  Filled 2014-05-11: qty 250

## 2014-05-11 MED ORDER — FLEET ENEMA 7-19 GM/118ML RE ENEM
1.0000 | ENEMA | Freq: Once | RECTAL | Status: AC
  Administered 2014-05-12: 1 via RECTAL
  Filled 2014-05-11: qty 1

## 2014-05-11 MED ORDER — DOCUSATE SODIUM 100 MG PO CAPS: 100.0000 mg | ORAL_CAPSULE | Freq: Two times a day (BID) | ORAL | Status: DC

## 2014-05-11 MED ORDER — POLYETHYLENE GLYCOL 3350 17 G PO PACK: 17.0000 g | PACK | Freq: Every day | ORAL | Status: DC

## 2014-05-11 MED ORDER — FLEET ENEMA 7-19 GM/118ML RE ENEM: 1.0000 | ENEMA | Freq: Every day | RECTAL | Status: DC | PRN

## 2014-05-11 NOTE — ED Notes (Signed)
Spoke with Margreta Journey, RN at [facility]. Updates given and acknowledged discharge orders. No other questions at this time.

## 2014-05-11 NOTE — Progress Notes (Signed)
D.  Pt pleasant on approach, hopeful to get her constipation problem resolved soon, had results today but will receive second enema in AM.  Pt also states that she feels her Seroquel isn't working as it once did and wishes to discuss this with Dr. Sabra Heck tomorrow.  Denies SI/HI/hallucinations at this time.  Pt did not attend evening AA group, interacting appropriately with peers on unit.  A.  Support and encouragement offered  R.  Pt remains safe on unit, will continue to monitor.

## 2014-05-11 NOTE — ED Notes (Signed)
Per patient-A&O x4. From [facility] there for alcohol detox. Has been constipated x9 days with only one "ball of stool passed." Pt reports having three enemas, miralax x3 with no relief of constipation. Feels pressure in abdomen radiating to right pelvic region which "feels like period cramps." Awaiting milk and molasses enema at bedside. Pt aware of this and indication for enema. In NAD. No other complaints at this time.

## 2014-05-11 NOTE — ED Notes (Signed)
She comes to [place] as a current inpatient at [facility]. And reports not having a b.m. For some 9 (nine) days.  She is in no distress.  She further tells [place] she has been dx with sarcoidosis.  So far, at [facility]. They have used Miralax; 3 fleet enemas; a Dulcolax supp. And other oral laxatives, with no results.

## 2014-05-11 NOTE — ED Provider Notes (Signed)
CSN: [id number]     Arrival date & time 05/11/14  1148 History   First MD Initiated Contact with Patient 05/11/14 1225     Chief Complaint  Patient presents with  . Constipation     (Consider location/radiation/quality/duration/timing/severity/associated sxs/prior Treatment) HPI Comments: Patient is a 48 year old female with history of hypothyroidism, depression and anxiety, bipolar 1 disorder, cirrhosis of the liver, Wegener's granulomatosis who [facility] for constipation. She reports that she has tried fleets enema, MiraLax, suppositories without relief of her symptoms. She states that she has not had a bowel movement in the past 9 days. She initially states to me that she has nausea without vomiting, but later reveals that she has been vomiting for quite some time. She states that the vomiting began prior to her admission to behavioral health and has persisted there. She use to "shoot Phenergan on the street", but since going to behavioral health she has only received Zofran. She states the zofran does not work as well as the [place]. She generally eats oatmeal and grits. She has a crampy abdominal pain.  Patient is a 48 y.o. female presenting with constipation. The history is provided by the patient. No language interpreter was used.  Constipation Associated symptoms: abdominal pain and nausea   Associated symptoms: no fever     Past Medical History  Diagnosis Date  . Hypothyroidism   . Blood transfusion July 2012  . Depression   . Anxiety   . Bipolar 1 disorder   . Menorrhagia   . Liver failure   . Bronchitis   . Cirrhosis of liver   . Wegener's granulomatosis   . History of sarcoidosis    Past Surgical History  Procedure Laterality Date  . Tubal ligation    . Laparoscopy    . Orthopedic surgery    . Cholecystectomy    . Fracture surgery    . Hernia repair    . Vaginal hysterectomy  07/27/2011    Procedure: HYSTERECTOMY VAGINAL;   Surgeon: [name], MD;  Location: [facility];  Service: Gynecology;  Laterality: N/A;  Vaginal Hysterectomy with Right Oophorectomy  . Laparotomy  07/28/2011    Procedure: EXPLORATORY LAPAROTOMY;  Surgeon: [name], MD;  Location: [facility];  Service: Gynecology;  Laterality: N/A;   Family History  Problem Relation Age of Onset  . Diabetes Mother   . Hypertension Mother   . Diabetes Daughter   . Hypertension Daughter    History  Substance Use Topics  . Smoking status: Current Some Day Smoker -- 0.25 packs/day  . Smokeless tobacco: Never Used  . Alcohol Use: Yes     Comment: a fifth of vodka and a 12 pk a day of beer    OB History   Grav Para Term Preterm Abortions TAB SAB Ect Mult Living   4 3 3  0 1     3     Review of Systems  Constitutional: Negative for fever and chills.  Respiratory: Negative for shortness of breath.   Cardiovascular: Negative for chest pain.  Gastrointestinal: Positive for nausea, abdominal pain and constipation.  All other systems reviewed and are negative.     Allergies  Fluoxetine hcl; Metoclopramide hcl; and Morphine and related  Home Medications   Prior to Admission medications   Medication Sig Start Date End Date Taking? Authorizing Provider  albuterol (PROVENTIL HFA;VENTOLIN HFA) 108 (90 BASE) MCG/ACT inhaler Inhale 1-2 puffs into the lungs every 6 (six) hours as needed  for wheezing. 07/24/13 07/24/14  Encarnacion Slates, NP  albuterol (PROVENTIL) (2.5 MG/3ML) 0.083% nebulizer solution Take 3 mLs (2.5 mg total) by nebulization every 6 (six) hours as needed for wheezing. 09/12/13   Nicole Pisciotta, PA-C  docusate sodium (COLACE) 100 MG capsule Take 1 capsule (100 mg total) by mouth 2 (two) times daily. 05/11/14   Elwyn Lade, PA-C  polyethylene glycol (MIRALAX / GLYCOLAX) packet Take 17 g by mouth daily. 05/11/14   Elwyn Lade, PA-C  sodium phosphate (FLEET) 7-19 GM/118ML ENEM Place 133 mLs (1 enema total) rectally daily as needed for severe  constipation. 05/11/14   Elwyn Lade, PA-C   BP 101/79  Pulse 64  Temp(Src) 97.9 F (36.6 C) (Oral)  Resp 16  Ht 5\' 7"  (1.702 m)  Wt 248 lb (112.492 kg)  BMI 38.83 kg/m2  SpO2 97% Physical Exam  Nursing note and vitals reviewed. Constitutional: She is oriented to person, place, and time. She appears well-developed and well-nourished. No distress.  HENT:  Head: Normocephalic and atraumatic.  Right Ear: External ear normal.  Left Ear: External ear normal.  Nose: Nose normal.  Mouth/Throat: Oropharynx is clear and moist.  Eyes: Conjunctivae are normal.  Neck: Normal range of motion.  Cardiovascular: Normal rate, regular rhythm and normal heart sounds.   Pulmonary/Chest: Effort normal and breath sounds normal. No stridor. No respiratory distress. She has no wheezes. She has no rales.  Abdominal: Soft. Bowel sounds are normal. She exhibits no distension. There is generalized tenderness. There is no rigidity, no rebound and no guarding.  Genitourinary: Rectum normal. Rectal exam shows no external hemorrhoid and no fissure.  There was no stool ball or obstruction felt on rectal exam.  Musculoskeletal: Normal range of motion.  Neurological: She is alert and oriented to person, place, and time. She has normal strength.  Skin: Skin is warm and dry. She is not diaphoretic. No erythema.  Psychiatric: She has a normal mood and affect. Her behavior is normal.    ED Course  Procedures (including critical care time) Labs Review Labs Reviewed  COMPREHENSIVE METABOLIC PANEL - Abnormal; Notable for the following:    Glucose, Bld 107 (*)    Albumin 3.4 (*)    AST 54 (*)    Alkaline Phosphatase 130 (*)    GFR calc non Af Amer 74 (*)    GFR calc Af Amer 86 (*)    All other components within normal limits  LIPASE, BLOOD - Abnormal; Notable for the following:    Lipase 266 (*)    All other components within normal limits  CBC WITH DIFFERENTIAL - Abnormal; Notable for the following:     WBC 1.8 (*)    RBC 3.84 (*)    MCV 101.3 (*)    Platelets 92 (*)    Neutro Abs 1.0 (*)    Lymphs Abs 0.5 (*)    All other components within normal limits  HEMOGLOBIN A1C  TSH    Imaging Review Dg Abd Acute W/chest  05/11/2014   CLINICAL DATA:  No BM for 9 days  EXAM: ACUTE ABDOMEN SERIES (ABDOMEN 2 VIEW & CHEST 1 VIEW)  COMPARISON:  05/03/2014  FINDINGS: Prior cholecystectomy. There is no evidence of dilated bowel loops or free intraperitoneal air. Moderate stool burden identified within the colon. No radiopaque calculi or other significant radiographic abnormality is seen. Heart size and mediastinal contours are within normal limits. Both lungs are clear.  IMPRESSION: 1. Moderate stool burden within the  colon compatible with the history of constipation.   Electronically Signed   By: Kerby Moors M.D.   On: 05/11/2014 13:07     EKG Interpretation None      MDM   Final diagnoses:  Constipation  Pancreatitis   Patient presents to ED from [facility] for constipation. Acute abd series shows constipation without evidence of obstruction. No large stool ball felt on rectal exam. Patient with large BM in ED after milk and molasses enema. Patient was found to additioanlly have mild pancreatitis. Abd is soft and patient is very distractable on abd exam. Patient feels significantly improved after BM and is stable for return to [facility]. Discussed with patient mild diet and discussed reasons to return to ED immediately. Vital signs stable for transfer back to [facility]. Dr. Leonides Schanz evaluated patient and agrees with plan. Patient / Family / Caregiver informed of clinical course, understand medical decision-making process, and agree with plan.   Elwyn Lade, PA-C 05/12/14 1357

## 2014-05-11 NOTE — Progress Notes (Signed)
Patient ID: Erin [name], female   DOB: [birth year], 48 y.o.   MRN: [id number]  D: Pt started that morning off very flat depressed, she was also complaining of severe abdominal pain due to not having a bowel movement in 9 days. Pt was sent to [facility], she returned to the unit at 3:30pm reported that she felt 100% better. Pt continues to report her depression as a 8, and her hopelessness as a 8. Pt reported being negative SI/HI, no AH/VH noted. A: 15 min checks continued for patient safety. R: Pt safety maintained.

## 2014-05-11 NOTE — Discharge Instructions (Signed)
Acute Pancreatitis Acute pancreatitis is a disease in which the pancreas becomes suddenly irritated (inflamed). The pancreas is a large gland behind your stomach. The pancreas makes enzymes that help digest food. The pancreas also makes 2 hormones that help control your blood sugar. Acute pancreatitis happens when the enzymes attack and damage the pancreas. Most attacks last a couple of days and can cause serious problems. HOME CARE  Follow your doctor's diet instructions. You may need to avoid alcohol and limit fat in your diet.  Eat small meals often.  Drink enough fluids to keep your pee (urine) clear or pale yellow.  Only take medicines as told by your doctor.  Avoid drinking alcohol if it caused your disease.  Do not smoke.  Get plenty of rest.  Check your blood sugar at home as told by your doctor.  Keep all doctor visits as told. GET HELP RIGHT AWAY IF:   You are unable to eat or keep fluids down.  Your pain becomes severe.  You have a fever or lasting symptoms for more than 2 to 3 days.  You have a fever and your symptoms suddenly get worse.  Your skin or the white part of your eyes turn yellow (jaundice).  You throw up (vomit).  You feel dizzy, or you pass out (faint).  Your blood sugar is high (over 300 mg/dL).  You do not get better as quickly as expected.  You have new or worsening symptoms.  You have lasting pain, weakness, or feel sick to your stomach (nauseous).  You get better and then have another pain attack. MAKE SURE YOU:   Understand these instructions.  Will watch your condition.  Will get help right away if you are not doing well or get worse. Document Released: 05/30/2008 Document Revised: 06/12/2012 Document Reviewed: 03/22/2012 [facility] Patient Information 2014 [place].  Constipation, Adult Constipation is when a person has fewer than 3 bowel movements a week; has difficulty having a bowel movement; or has stools that are dry,  hard, or larger than normal. As people grow older, constipation is more common. If you try to fix constipation with medicines that make you have a bowel movement (laxatives), the problem may get worse. Long-term laxative use may cause the muscles of the colon to become weak. A low-fiber diet, not taking in enough fluids, and taking certain medicines may make constipation worse. CAUSES   Certain medicines, such as antidepressants, pain medicine, iron supplements, antacids, and water pills.   Certain diseases, such as diabetes, irritable bowel syndrome (IBS), thyroid disease, or depression.   Not drinking enough water.   Not eating enough fiber-rich foods.   Stress or travel.  Lack of physical activity or exercise.  Not going to the restroom when there is the urge to have a bowel movement.  Ignoring the urge to have a bowel movement.  Using laxatives too much. SYMPTOMS   Having fewer than 3 bowel movements a week.   Straining to have a bowel movement.   Having hard, dry, or larger than normal stools.   Feeling full or bloated.   Pain in the lower abdomen.  Not feeling relief after having a bowel movement. DIAGNOSIS  Your caregiver will take a medical history and perform a physical exam. Further testing may be done for severe constipation. Some tests may include:   A barium enema X-ray to examine your rectum, colon, and sometimes, your small intestine.  A sigmoidoscopy to examine your lower colon.  A colonoscopy to  examine your entire colon. TREATMENT  Treatment will depend on the severity of your constipation and what is causing it. Some dietary treatments include drinking more fluids and eating more fiber-rich foods. Lifestyle treatments may include regular exercise. If these diet and lifestyle recommendations do not help, your caregiver may recommend taking over-the-counter laxative medicines to help you have bowel movements. Prescription medicines may be prescribed  if over-the-counter medicines do not work.  HOME CARE INSTRUCTIONS   Increase dietary fiber in your diet, such as fruits, vegetables, whole grains, and beans. Limit high-fat and processed sugars in your diet, such as [place] fries, hamburgers, cookies, candies, and soda.   A fiber supplement may be added to your diet if you cannot get enough fiber from foods.   Drink enough fluids to keep your urine clear or pale yellow.   Exercise regularly or as directed by your caregiver.   Go to the restroom when you have the urge to go. Do not hold it.  Only take medicines as directed by your caregiver. Do not take other medicines for constipation without talking to your caregiver first. [place] IF:   You have bright red blood in your stool.   Your constipation lasts for more than 4 days or gets worse.   You have abdominal or rectal pain.   You have thin, pencil-like stools.  You have unexplained weight loss. MAKE SURE YOU:   Understand these instructions.  Will watch your condition.  Will get help right away if you are not doing well or get worse. Document Released: 09/09/2004 Document Revised: 03/05/2012 Document Reviewed: 09/23/2013 [facility] Patient Information 2014 [place], [place].

## 2014-05-11 NOTE — [facility] Group Notes (Signed)
0900 nursing orientation   The focus of this group is to educate the patient on the purpose and policies of crisis stabilization and provide a format to answer questions about their admission.  The group details unit policies and expectations of patients while admitted.   Pt was an active participant.  She rated her depression and anxiety a 8 She is concerned that her "bowels have not moved and I am suppose to be first priority" Providers are aware.

## 2014-05-11 NOTE — ED Notes (Signed)
[facility] called to transport pt back to [facility].

## 2014-05-11 NOTE — Progress Notes (Signed)
Pt reported this morning that she had not had a bowel movement now for 9 days. Pt reported that she was in a lot of pain and that she was nauseated. Aggie NP made aware patient was given another enema, for which she got no relief. Aggie NP advised that patient be transported to [facility], report was called to [employer] the charge nurse.

## 2014-05-11 NOTE — [facility] Group Notes (Signed)
  [place] LCSW Group Therapy Note   05/11/2014  10:00 AM  To 10:45AM   Type of Therapy and Topic: Group Therapy: Feelings Around Returning Home & Establishing a Supportive Framework and Activity to Identify signs of Improvement or Decompensation   Participation Level: Did not attend; awaiting transport to ED for abdominal pain.  Sheilah Pigeon, LCSW

## 2014-05-11 NOTE — Progress Notes (Signed)
[facility] MD Progress Note  05/11/2014 3:43 PM Erin [name]  MRN:  [id number]  Subjective:  Erin [name] reports, "I don't feel good. My mood is bad. My stomach is hurting. I have not had a bowel movement in 9 days. I need relief. I feel horrible" Erin [name] has been given Enema x 3 at different times and days, she is on at 2 separate laxatives routinely. Received Magnesium Citrate 1 bottle x 1 without relief, including Doculax supp. Will transfer to ED for evaluation.  Diagnosis:   DSM5: Schizophrenia Disorders:  NA Obsessive-Compulsive Disorders:  NA Trauma-Stressor Disorders:  NA Substance/Addictive Disorders:  Opioid dependence Depressive Disorders:  NA  Axis I: Opioid dependence Axis II: Deferred Axis III:  Past Medical History  Diagnosis Date  . Hypothyroidism   . Blood transfusion July 2012  . Depression   . Anxiety   . Bipolar 1 disorder   . Menorrhagia   . Liver failure   . Bronchitis   . Cirrhosis of liver   . Wegener's granulomatosis   . History of sarcoidosis    Axis IV: Polysubstance dependence Axis V: 41-50 serious symptoms  ADL's:  Intact  Sleep: Fair  Appetite:  Fair  Suicidal Ideation:  Plan:  Denies Intent:  Denies Means:  Denies Homicidal Ideation:  Plan:  Denies Intent:  Denies Means:  Denies AEB (as evidenced by):  Psychiatric Specialty Exam: Physical Exam  Psychiatric: Her speech is normal and behavior is normal. Judgment and thought content normal. Her mood appears anxious. Cognition and memory are normal. She exhibits a depressed mood.    Review of Systems  Constitutional: Positive for malaise/fatigue.  HENT: Negative.   Eyes: Negative.   Respiratory: Negative.   Cardiovascular: Negative.   Gastrointestinal: Positive for nausea, abdominal pain and constipation.  Genitourinary: Negative.   Musculoskeletal: Positive for myalgias.  Skin: Negative.   Neurological: Positive for dizziness and weakness.  Endo/Heme/Allergies: Negative.    Psychiatric/Behavioral: Positive for depression and substance abuse. Negative for suicidal ideas, hallucinations and memory loss. The patient is nervous/anxious and has insomnia.     Blood pressure 101/79, pulse 64, temperature 97.9 F (36.6 C), temperature source Oral, resp. rate 16, height _0  (1.702 m), weight 112.492 kg (248 lb), SpO2 97.00%.Body mass index is 38.83 kg/(m^2).  General Appearance: Casual  Eye Contact::  Fair  Speech:  Clear and Coherent  Volume:  Normal  Mood:  Anxious, Depressed and Irritable  Affect:  Flat  Thought Process:  Coherent and Intact  Orientation:  Full (Time, Place, and Person)  Thought Content:  Rumination  Suicidal Thoughts:  No  Homicidal Thoughts:  No  Memory:  Immediate;   Good Recent;   Fair Remote;   Fair  Judgement:  Fair  Insight:  Fair  Psychomotor Activity:  Restlessness  Concentration:  Poor  Recall:  [place]: Fair  Akathisia:  No  Handed:  Right  AIMS (if indicated):     Assets:  Desire for Improvement  Sleep:  Number of Hours: 6   Musculoskeletal: Strength & Muscle Tone: within normal limits Gait & Station: normal Patient leans: N/A  Current Medications: Current Facility-Administered Medications  Medication Dose Route Frequency Provider Last Rate Last Dose  . acetaminophen (TYLENOL) tablet 650 mg  650 mg Oral Q6H PRN Dara Hoyer, PA-C      . albuterol (PROVENTIL HFA;VENTOLIN HFA) 108 (90 BASE) MCG/ACT inhaler 2 puff  2 puff Inhalation Q4H PRN Dara Hoyer, PA-C  2 puff at 05/09/14 0806  . albuterol (PROVENTIL) (2.5 MG/3ML) 0.083% nebulizer solution 2.5 mg  2.5 mg Nebulization Q6H PRN Encarnacion Slates, NP   2.5 mg at 05/07/14 1645  . amoxicillin-clavulanate (AUGMENTIN) 875-125 MG per tablet 1 tablet  1 tablet Oral Q12H Encarnacion Slates, NP   1 tablet at 05/11/14 0721  . dextromethorphan-guaiFENesin (MUCINEX DM) 30-600 MG per 12 hr tablet 1 tablet  1 tablet Oral BID Encarnacion Slates, NP   1  tablet at 05/11/14 0630  . docusate sodium (COLACE) capsule 100 mg  100 mg Oral BID Nicholaus Bloom, MD   100 mg at 05/11/14 1601  . DULoxetine (CYMBALTA) DR capsule 30 mg  30 mg Oral Daily Dara Hoyer, PA-C   30 mg at 05/11/14 0932  . feeding supplement (ENSURE COMPLETE) (ENSURE COMPLETE) liquid 237 mL  237 mL Oral BID BM Nicholaus Bloom, MD   237 mL at 05/11/14 0720  . gabapentin (NEURONTIN) capsule 300 mg  300 mg Oral TID Encarnacion Slates, NP   300 mg at 05/11/14 3557  . ibuprofen (ADVIL,MOTRIN) tablet 600 mg  600 mg Oral Q8H PRN Dara Hoyer, PA-C   600 mg at 05/11/14 1411  . lithium carbonate capsule 300 mg  300 mg Oral BID WC Nicholaus Bloom, MD   300 mg at 05/11/14 3220  . magnesium hydroxide (MILK OF MAGNESIA) suspension 30 mL  30 mL Oral Daily PRN Dara Hoyer, PA-C   30 mL at 05/06/14 1126  . multivitamin with minerals tablet 1 tablet  1 tablet Oral Daily Dara Hoyer, PA-C   1 tablet at 05/11/14 2542  . ondansetron (ZOFRAN) tablet 4 mg  4 mg Oral Q8H PRN Dara Hoyer, PA-C   4 mg at 05/10/14 1124  . polyethylene glycol (MIRALAX / GLYCOLAX) packet 17 g  17 g Oral Daily Nicholaus Bloom, MD   17 g at 05/11/14 0720  . propranolol (INDERAL) tablet 10 mg  10 mg Oral TID Encarnacion Slates, NP   10 mg at 05/11/14 0721  . QUEtiapine (SEROQUEL) tablet 300 mg  300 mg Oral QHS Encarnacion Slates, NP   300 mg at 05/10/14 2107  . QUEtiapine (SEROQUEL) tablet 50 mg  50 mg Oral TID Nicholaus Bloom, MD   50 mg at 05/11/14 7062  . thiamine (B-1) injection 100 mg  100 mg Intramuscular Once Dara Hoyer, PA-C      . thiamine (VITAMIN B-1) tablet 100 mg  100 mg Oral Daily Dara Hoyer, PA-C   100 mg at 05/11/14 3762  . thyroid (ARMOUR) tablet 30 mg  30 mg Oral QAC breakfast Nicholaus Bloom, MD   30 mg at 05/11/14 0600  . traZODone (DESYREL) tablet 100 mg  100 mg Oral QHS PRN Nicholaus Bloom, MD   100 mg at 05/10/14 2107   Current Outpatient Prescriptions  Medication Sig Dispense Refill  . albuterol (PROVENTIL  HFA;VENTOLIN HFA) 108 (90 BASE) MCG/ACT inhaler Inhale 1-2 puffs into the lungs every 6 (six) hours as needed for wheezing.      Marland Kitchen albuterol (PROVENTIL) (2.5 MG/3ML) 0.083% nebulizer solution Take 3 mLs (2.5 mg total) by nebulization every 6 (six) hours as needed for wheezing.  75 mL  12  . docusate sodium (COLACE) 100 MG capsule Take 1 capsule (100 mg total) by mouth 2 (two) times daily.  10 capsule  0  . polyethylene glycol (MIRALAX / GLYCOLAX)  packet Take 17 g by mouth daily.  14 each  0  . sodium phosphate (FLEET) 7-19 GM/118ML ENEM Place 133 mLs (1 enema total) rectally daily as needed for severe constipation.  5 enema  0    Lab Results:  Results for orders placed during the hospital encounter of 05/03/14 (from the past 48 hour(s))  COMPREHENSIVE METABOLIC PANEL     Status: Abnormal   Collection Time    05/11/14  1:53 PM      Result Value Ref Range   Sodium 137  137 - 147 mEq/L   Potassium 4.8  3.7 - 5.3 mEq/L   Chloride 99  96 - 112 mEq/L   CO2 32  19 - 32 mEq/L   Glucose, Bld 107 (*) 70 - 99 mg/dL   BUN 13  6 - 23 mg/dL   Creatinine, Ser 0.90  0.50 - 1.10 mg/dL   Calcium 10.4  8.4 - 10.5 mg/dL   Total Protein 7.3  6.0 - 8.3 g/dL   Albumin 3.4 (*) 3.5 - 5.2 g/dL   AST 54 (*) 0 - 37 U/L   ALT 34  0 - 35 U/L   Alkaline Phosphatase 130 (*) 39 - 117 U/L   Total Bilirubin 1.1  0.3 - 1.2 mg/dL   GFR calc non Af Amer 74 (*) >90 mL/min   GFR calc Af Amer 86 (*) >90 mL/min   Comment: (NOTE)     The eGFR has been calculated using the CKD EPI equation.     This calculation has not been validated in all clinical situations.     eGFR's persistently <90 mL/min signify possible Chronic Kidney     Disease.  LIPASE, BLOOD     Status: Abnormal   Collection Time    05/11/14  1:53 PM      Result Value Ref Range   Lipase 266 (*) 11 - 59 U/L  CBC WITH DIFFERENTIAL     Status: Abnormal   Collection Time    05/11/14  1:53 PM      Result Value Ref Range   WBC 1.8 (*) 4.0 - 10.5 K/uL   RBC  3.84 (*) 3.87 - 5.11 MIL/uL   Hemoglobin 12.8  12.0 - 15.0 g/dL   HCT 38.9  36.0 - 46.0 %   MCV 101.3 (*) 78.0 - 100.0 fL   MCH 33.3  26.0 - 34.0 pg   MCHC 32.9  30.0 - 36.0 g/dL   RDW 13.2  11.5 - 15.5 %   Platelets 92 (*) 150 - 400 K/uL   Comment: PLATELET COUNT CONFIRMED BY SMEAR   Neutrophils Relative % 57%  43 - 77 %   Neutro Abs 1.0 (*) 1.7 - 7.7 K/uL   Lymphocytes Relative 30%  12 - 46 %   Lymphs Abs 0.5 (*) 0.7 - 4.0 K/uL   Monocytes Relative 9%  3 - 12 %   Monocytes Absolute 0.2  0.1 - 1.0 K/uL   Eosinophils Relative 3%  0 - 5 %   Eosinophils Absolute 0.1  0.0 - 0.7 K/uL   Basophils Relative 1%  0 - 1 %   Basophils Absolute 0.0  0.0 - 0.1 K/uL    Physical Findings: AIMS: Facial and Oral Movements Muscles of Facial Expression: None, normal Lips and Perioral Area: None, normal Jaw: None, normal Tongue: None, normal,Extremity Movements Upper (arms, wrists, hands, fingers): None, normal Lower (legs, knees, ankles, toes): None, normal, Trunk Movements Neck, shoulders, hips: None, normal, Overall  Severity Severity of abnormal movements (highest score from questions above): None, normal Incapacitation due to abnormal movements: None, normal Patient's awareness of abnormal movements (rate only patient's report): No Awareness, Dental Status Current problems with teeth and/or dentures?: No Does patient usually wear dentures?: No  CIWA:  CIWA-Ar Total: 4 COWS:     Treatment Plan Summary: Daily contact with patient to assess and evaluate symptoms and progress in treatment Medication management  Plan: Supportive approach/coping skills/relapse prevention. Encouraged out of room, participation in group sessions and application of coping skills when distressed. Transfer to the [facility] ED for complain of abdominal pain, chronic constipation severe. Continue current plan of care.  Medical Decision Making Problem Points:  Established problem, stable/improving (1), Review  of last therapy session (1) and Review of psycho-social stressors (1) Data Points:  Review of medication regiment & side effects (2) Review of new medications or change in dosage (2)  I certify that inpatient services furnished can reasonably be expected to improve the patient's condition.   Encarnacion Slates, PMHNP-BC 05/11/2014, 3:43 PM  I agreed with the findings, treatment and disposition plan of this patient. Berniece Andreas, MD

## 2014-05-12 MED ORDER — OLANZAPINE 2.5 MG PO TABS
2.5000 mg | ORAL_TABLET | Freq: Every day | ORAL | Status: DC
  Filled 2014-05-12 (×2): qty 1

## 2014-05-12 MED ORDER — BOOST / RESOURCE BREEZE PO LIQD
1.0000 | Freq: Two times a day (BID) | ORAL | Status: DC
  Administered 2014-05-12 – 2014-05-16 (×6): 1 via ORAL
  Filled 2014-05-12 (×12): qty 1

## 2014-05-12 MED ORDER — OLANZAPINE 2.5 MG PO TABS
2.5000 mg | ORAL_TABLET | Freq: Two times a day (BID) | ORAL | Status: DC
  Administered 2014-05-12 – 2014-05-13 (×3): 2.5 mg via ORAL
  Filled 2014-05-12 (×7): qty 1

## 2014-05-12 MED ORDER — OLANZAPINE 10 MG PO TBDP
10.0000 mg | ORAL_TABLET | Freq: Every day | ORAL | Status: DC
  Administered 2014-05-12: 10 mg via ORAL
  Filled 2014-05-12 (×2): qty 1

## 2014-05-12 MED ORDER — BOOST / RESOURCE BREEZE PO LIQD: 1.0000 | Freq: Three times a day (TID) | ORAL | Status: DC

## 2014-05-12 NOTE — Progress Notes (Signed)
D:Pt is interacting with staff and peers on the unit. Pt reports that she drinks breeze instead of ensure. Alerted MD and received verbal order to change. Pt is taking medication as ordered. She reports having constipation and an enema ordered. She reports that she had BM yesterday at [facility]. She is requesting prune juice. A:Gave prune juice as requested. Assisted with enema and pt tolerated well. Cafeteria notified and prune juice ordered. Offered support and 15 minute checks. R:Pt denies si and hi. Safety maintained on the unit.

## 2014-05-12 NOTE — Progress Notes (Signed)
Adult Psychoeducational Group Note  Date:  05/12/2014 Time:  10:00 am  Group Topic/Focus:  Wellness Toolbox:   The focus of this group is to discuss various aspects of wellness, balancing those aspects and exploring ways to increase the ability to experience wellness.  Patients will create a wellness toolbox for use upon discharge.  Participation Level:  Active  Participation Quality:  Appropriate, Sharing and Supportive  Affect:  Appropriate  Cognitive:  Appropriate  Insight: Appropriate  Engagement in Group:  Engaged  Modes of Intervention:  Discussion, Education, Socialization and Support  Additional Comments:  Pt stated that she does not know what she can do in her personal life to assist with living more healthy. Pt stated that she has been drinking since her sister was killed when she was 48.   Erin [name] 05/12/2014, 5:23 PM

## 2014-05-12 NOTE — Progress Notes (Signed)
D Pt. Denies SI and HI, no complaints of pain or discomfort noted.  A Writer offered support and encouragement.   R Pt. Remains safe on the unit.  Pt. Has been interacting appropriately with staff and peers, did receive trazodone for sleep.

## 2014-05-12 NOTE — Progress Notes (Signed)
[facility] MD Progress Note  05/12/2014 5:48 PM Erin [name]  MRN:  [id number] Subjective:  States that she is having a hard time. Thinks that the Seroquel is turning against her making her more agitated and keeping her from being able to sleep. She is also concerned that the Seroquel is worsening her contipation Diagnosis:   DSM5: Schizophrenia Disorders:  none Obsessive-Compulsive Disorders:  none Trauma-Stressor Disorders:  none Substance/Addictive Disorders:  Alcohol Related Disorder - Severe (303.90) and Opioid Disorder - Severe (304.00), Cocaine related disorder Depressive Disorders:  Major Depressive Disorder - Moderate (296.22) Total Time spent with patient: 30 minutes  Axis I: Bipolar, Depressed and Substance Induced Mood Disorder  ADL's:  Intact  Sleep: Poor  Appetite:  Fair  Suicidal Ideation:  Plan:  denies Intent:  denies Means:  denies Homicidal Ideation:  Plan:  denies Intent:  denies Means:  denies AEB (as evidenced by):  Psychiatric Specialty Exam: Physical Exam  Review of Systems  Constitutional: Positive for malaise/fatigue.  HENT: Negative.   Eyes: Negative.   Respiratory: Negative.   Gastrointestinal: Positive for abdominal pain and constipation.  Genitourinary: Negative.   Musculoskeletal: Positive for back pain and myalgias.  Skin: Negative.   Endo/Heme/Allergies: Negative.   Psychiatric/Behavioral: Positive for depression and substance abuse. The patient is nervous/anxious and has insomnia.     Blood pressure 108/75, pulse 68, temperature 97.6 F (36.4 C), temperature source Oral, resp. rate 24, height '5\' 7"'  (1.702 m), weight 112.492 kg (248 lb), SpO2 97.00%.Body mass index is 38.83 kg/(m^2).  General Appearance: Fairly Groomed  Engineer, water::  Fair  Speech:  Clear and Coherent  Volume:  fluctuates  Mood:  Anxious, Depressed and worried  Affect:  Restricted and anxious, worried  Thought Process:  Coherent and Goal Directed  Orientation:  Full  (Time, Place, and Person)  Thought Content:  sytmptoms, worries, concerns, somatically focused  Suicidal Thoughts:  No  Homicidal Thoughts:  No  Memory:  Immediate;   Fair Recent;   Fair Remote;   Fair  Judgement:  Fair  Insight:  Present and Shallow  Psychomotor Activity:  Restlessness  Concentration:  Fair  Recall:  [employer] of Knowledge:NA  Language: Fair  Akathisia:  No  Handed:    AIMS (if indicated):     Assets:  Desire for Improvement  Sleep:  Number of Hours: 5   Musculoskeletal: Strength & Muscle Tone: within normal limits Gait & Station: normal Patient leans: N/A  Current Medications: Current Facility-Administered Medications  Medication Dose Route Frequency Provider Last Rate Last Dose  . acetaminophen (TYLENOL) tablet 650 mg  650 mg Oral Q6H PRN Dara Hoyer, PA-C      . albuterol (PROVENTIL HFA;VENTOLIN HFA) 108 (90 BASE) MCG/ACT inhaler 2 puff  2 puff Inhalation Q4H PRN Dara Hoyer, PA-C   2 puff at 05/11/14 1659  . albuterol (PROVENTIL) (2.5 MG/3ML) 0.083% nebulizer solution 2.5 mg  2.5 mg Nebulization Q6H PRN Encarnacion Slates, NP   2.5 mg at 05/07/14 1645  . amoxicillin-clavulanate (AUGMENTIN) 875-125 MG per tablet 1 tablet  1 tablet Oral Q12H Encarnacion Slates, NP   1 tablet at 05/12/14 0759  . docusate sodium (COLACE) capsule 100 mg  100 mg Oral BID Nicholaus Bloom, MD   100 mg at 05/12/14 1718  . DULoxetine (CYMBALTA) DR capsule 30 mg  30 mg Oral Daily Dara Hoyer, PA-C   30 mg at 05/12/14 0759  . feeding supplement (RESOURCE BREEZE) (RESOURCE BREEZE)  liquid 1 Container  1 Container Oral BID WC Nicholaus Bloom, MD   1 Container at 05/12/14 1718  . gabapentin (NEURONTIN) capsule 300 mg  300 mg Oral TID Encarnacion Slates, NP   300 mg at 05/12/14 1719  . ibuprofen (ADVIL,MOTRIN) tablet 600 mg  600 mg Oral Q8H PRN Dara Hoyer, PA-C   600 mg at 05/12/14 1039  . lithium carbonate capsule 300 mg  300 mg Oral BID WC Nicholaus Bloom, MD   300 mg at 05/12/14 1718  .  magnesium hydroxide (MILK OF MAGNESIA) suspension 30 mL  30 mL Oral Daily PRN Dara Hoyer, PA-C   30 mL at 05/06/14 1126  . multivitamin with minerals tablet 1 tablet  1 tablet Oral Daily Dara Hoyer, PA-C   1 tablet at 05/12/14 0759  . OLANZapine (ZYPREXA) tablet 2.5 mg  2.5 mg Oral BID Nicholaus Bloom, MD   2.5 mg at 05/12/14 1256  . OLANZapine zydis (ZYPREXA) disintegrating tablet 10 mg  10 mg Oral QHS Nicholaus Bloom, MD      . ondansetron [facility]) tablet 4 mg  4 mg Oral Q8H PRN Dara Hoyer, PA-C   4 mg at 05/10/14 1124  . polyethylene glycol (MIRALAX / GLYCOLAX) packet 17 g  17 g Oral Daily Nicholaus Bloom, MD   17 g at 05/12/14 0801  . propranolol (INDERAL) tablet 10 mg  10 mg Oral TID Encarnacion Slates, NP   10 mg at 05/12/14 1718  . thiamine (B-1) injection 100 mg  100 mg Intramuscular Once Dara Hoyer, PA-C      . thiamine (VITAMIN B-1) tablet 100 mg  100 mg Oral Daily Dara Hoyer, PA-C   100 mg at 05/12/14 0801  . thyroid (ARMOUR) tablet 30 mg  30 mg Oral QAC breakfast Nicholaus Bloom, MD   30 mg at 05/12/14 0554  . traZODone (DESYREL) tablet 100 mg  100 mg Oral QHS PRN Nicholaus Bloom, MD   100 mg at 05/11/14 2107    Lab Results:  Results for orders placed during the hospital encounter of 05/03/14 (from the past 48 hour(s))  COMPREHENSIVE METABOLIC PANEL     Status: Abnormal   Collection Time    05/11/14  1:53 PM      Result Value Ref Range   Sodium 137  137 - 147 mEq/L   Potassium 4.8  3.7 - 5.3 mEq/L   Chloride 99  96 - 112 mEq/L   CO2 32  19 - 32 mEq/L   Glucose, Bld 107 (*) 70 - 99 mg/dL   BUN 13  6 - 23 mg/dL   Creatinine, Ser 0.90  0.50 - 1.10 mg/dL   Calcium 10.4  8.4 - 10.5 mg/dL   Total Protein 7.3  6.0 - 8.3 g/dL   Albumin 3.4 (*) 3.5 - 5.2 g/dL   AST 54 (*) 0 - 37 U/L   ALT 34  0 - 35 U/L   Alkaline Phosphatase 130 (*) 39 - 117 U/L   Total Bilirubin 1.1  0.3 - 1.2 mg/dL   GFR calc non Af Amer 74 (*) >90 mL/min   GFR calc Af Amer 86 (*) >90 mL/min    Comment: (NOTE)     The eGFR has been calculated using the CKD EPI equation.     This calculation has not been validated in all clinical situations.     eGFR's persistently <90 mL/min signify possible Chronic  Kidney     Disease.  LIPASE, BLOOD     Status: Abnormal   Collection Time    05/11/14  1:53 PM      Result Value Ref Range   Lipase 266 (*) 11 - 59 U/L  CBC WITH DIFFERENTIAL     Status: Abnormal   Collection Time    05/11/14  1:53 PM      Result Value Ref Range   WBC 1.8 (*) 4.0 - 10.5 K/uL   RBC 3.84 (*) 3.87 - 5.11 MIL/uL   Hemoglobin 12.8  12.0 - 15.0 g/dL   HCT 38.9  36.0 - 46.0 %   MCV 101.3 (*) 78.0 - 100.0 fL   MCH 33.3  26.0 - 34.0 pg   MCHC 32.9  30.0 - 36.0 g/dL   RDW 13.2  11.5 - 15.5 %   Platelets 92 (*) 150 - 400 K/uL   Comment: PLATELET COUNT CONFIRMED BY SMEAR   Neutrophils Relative % 57%  43 - 77 %   Neutro Abs 1.0 (*) 1.7 - 7.7 K/uL   Lymphocytes Relative 30%  12 - 46 %   Lymphs Abs 0.5 (*) 0.7 - 4.0 K/uL   Monocytes Relative 9%  3 - 12 %   Monocytes Absolute 0.2  0.1 - 1.0 K/uL   Eosinophils Relative 3%  0 - 5 %   Eosinophils Absolute 0.1  0.0 - 0.7 K/uL   Basophils Relative 1%  0 - 1 %   Basophils Absolute 0.0  0.0 - 0.1 K/uL    Physical Findings: AIMS: Facial and Oral Movements Muscles of Facial Expression: None, normal Lips and Perioral Area: None, normal Jaw: None, normal Tongue: None, normal,Extremity Movements Upper (arms, wrists, hands, fingers): None, normal Lower (legs, knees, ankles, toes): None, normal, Trunk Movements Neck, shoulders, hips: None, normal, Overall Severity Severity of abnormal movements (highest score from questions above): None, normal Incapacitation due to abnormal movements: None, normal Patient's awareness of abnormal movements (rate only patient's report): No Awareness, Dental Status Current problems with teeth and/or dentures?: No Does patient usually wear dentures?: No  CIWA:  CIWA-Ar Total: 4 COWS:      Treatment Plan Summary: Daily contact with patient to assess and evaluate symptoms and progress in treatment Medication management  Plan: Supportive approach/coping skills/relapse prevention           D/C the Seroquel           Trial with Zyprexa 2.5 mg BID, Zydis 10 mg HS           Follow up on the constipation: Colace 100 mg BID, Miralax 17 g  Medical Decision Making Problem Points:  Established problem, worsening (2) and Review of last therapy session (1) Data Points:  Review of medication regiment & side effects (2) Review of new medications or change in dosage (2)  I certify that inpatient services furnished can reasonably be expected to improve the patient's condition.   Nicholaus Bloom 05/12/2014, 5:48 PM

## 2014-05-12 NOTE — Progress Notes (Signed)
Adult Psychoeducational Group Note  Date:  05/12/2014 Time:  10:03 PM  Group Topic/Focus:  AA meeting  Participation Level:  Active  Participation Quality:  Appropriate  Affect:  Appropriate  Cognitive:  Appropriate  Insight: Appropriate  Engagement in Group:  Engaged  Modes of Intervention:  Discussion  Additional Comments:  Pt attended [place] meeting for the last 30 minutes of the meeting  Kiernan Farkas 05/12/2014, 10:03 PM

## 2014-05-12 NOTE — BHH Group Notes (Signed)
[employer] LCSW Group Therapy  05/12/2014 3:10 PM  Type of Therapy:  Group Therapy  Participation Level:  Minimal  Participation Quality:  Drowsy and Monopolizing  Affect:  Flat and Irritable  Cognitive:  Lacking  Insight:  Poor  Engagement in Therapy:  Limited  Modes of Intervention:  Confrontation, Discussion, Education, Exploration, Limit-setting, Problem-solving, Rapport Building, Socialization and Support  Summary of Progress/Problems: Today's Topic: Overcoming Obstacles. Pt identified obstacles faced currently and processed barriers involved in overcoming these obstacles. Pt identified steps necessary for overcoming these obstacles and explored motivation (internal and external) for facing these difficulties head on. Pt further identified one area of concern in their lives and chose a skill of focus pulled from their "toolbox." Erin [name] was inattentive during today's therapy group. When asked about obstacles, Erin [name] shared that she needs a dentist appt. CSW explained how she can go about working with PA/NP to make medical appts but wanted to focus on personal obstacles after leaving hospital. Erin [name] had difficulty remaining on topic during today's group and was asked several times to refrain from side conversations.    Daron Stutz Smart LCSWA  05/12/2014, 3:10 PM

## 2014-05-12 NOTE — BHH Group Notes (Signed)
[facility] LCSW Aftercare Discharge Planning Group Note   05/12/2014 11:17 AM  Participation Quality:  Monopolizing   Mood/Affect:  Irritable and Lethargic  Depression Rating:  10  Anxiety Rating:  10  Thoughts of Suicide:  Passive SI Will you contract for safety?   Yes   Current AVH:  No  Plan for Discharge/Comments:  Pt reports that she does not want to go to [place] anymore. "I want to go somewhere where I can get narcotics for my pain." CSW explained that waiting lists are long for [facility], [facility]. Pt refusing all other options. Pt reports severe withdrawals today. Irritable and lethargic during group. "My meds are not working. I feel like crap."   Transportation Means: unknown at this time.   Supports: none identified by pt.   Lendon George Smart LCSWA  05/12/2014 11:19 AM

## 2014-05-13 MED ORDER — TRAZODONE HCL 100 MG PO TABS
100.0000 mg | ORAL_TABLET | Freq: Every evening | ORAL | Status: DC | PRN
  Administered 2014-05-13 – 2014-05-15 (×6): 100 mg via ORAL
  Filled 2014-05-13 (×3): qty 1
  Filled 2014-05-13: qty 8
  Filled 2014-05-13: qty 1
  Filled 2014-05-13: qty 8
  Filled 2014-05-13 (×4): qty 1

## 2014-05-13 MED ORDER — PROPRANOLOL HCL 20 MG PO TABS
20.0000 mg | ORAL_TABLET | Freq: Three times a day (TID) | ORAL | Status: DC
  Administered 2014-05-13 – 2014-05-14 (×3): 20 mg via ORAL
  Filled 2014-05-13 (×9): qty 1

## 2014-05-13 MED ORDER — OLANZAPINE 10 MG PO TBDP
20.0000 mg | ORAL_TABLET | Freq: Every day | ORAL | Status: DC
  Administered 2014-05-13 – 2014-05-15 (×3): 20 mg via ORAL
  Filled 2014-05-13 (×5): qty 2

## 2014-05-13 MED ORDER — OLANZAPINE 5 MG PO TABS
5.0000 mg | ORAL_TABLET | Freq: Two times a day (BID) | ORAL | Status: DC
  Administered 2014-05-13 – 2014-05-16 (×6): 5 mg via ORAL
  Filled 2014-05-13 (×3): qty 1
  Filled 2014-05-13: qty 8
  Filled 2014-05-13: qty 1
  Filled 2014-05-13: qty 8
  Filled 2014-05-13 (×4): qty 1

## 2014-05-13 NOTE — Progress Notes (Signed)
Recreation Therapy Notes  Animal-Assisted Activity/Therapy (AAA/T) Program Checklist/Progress Notes Patient Eligibility Criteria Checklist & Daily Group note for Rec Tx Intervention  Date: 05.19.2015 Time: 2:45pm Location: [address]    AAA/T Program Assumption of Risk Form signed by Patient/ or Parent Legal Guardian yes  Patient is free of allergies or sever asthma yes  Patient reports no fear of animals yes  Patient reports no history of cruelty to animals yes   Patient understands his/her participation is voluntary yes  Patient washes hands before animal contact yes  Patient washes hands after animal contact yes  Behavioral Response: Appropriate   Education: Hand Washing, Appropriate Animal Interaction   Education Outcome: Acknowledges understanding  Clinical Observations/Feedback: Patient engaged with therapy dog and peers appropriately.   Erin [name], LRT/CTRS  Erin [name] 05/13/2014 4:24 PM

## 2014-05-13 NOTE — Progress Notes (Signed)
D: Patient denies SI/HI and A/V hallucinations; patient reports sleep is poor; reports appetite is improving; reports energy level is low ; reports ability to pay attention is poor; rates depression as 8/10; rates hopelessness 8/10; patient reports tremors, agitation, and cravings  A: Monitored q 15 minutes; patient encouraged to attend groups; patient educated about medications; patient given medications per physician orders; patient encouraged to express feelings and/or concerns  R: Patient asks a lot of questions about her medications and does not appear to have a lot of agitation when interacting with peers; patient observed laughing and joking when she is with them and talkative; patient is asking multiple questions about her follow up plans; patient's interaction with staff and peers is assertive; patient is taking medications as prescribed and tolerating medications; patient is attending all groups

## 2014-05-13 NOTE — Progress Notes (Signed)
[facility] MD Progress Note  05/13/2014 3:08 PM Erin [name]  MRN:  [id number] Subjective:  Erin [name] states that she did not sleep well last night. States she slept less than the night before but that she does not want to go back on the Seroquel. Endorses persistent agitation. Willing to continue to give Zyprexa a try. She continues to endorse problems with the constipation Diagnosis:   DSM5: Schizophrenia Disorders:  none Obsessive-Compulsive Disorders:  none Trauma-Stressor Disorders:  none Substance/Addictive Disorders:  Alcohol Related Disorder - Severe (303.90), Cocaine related disorder Depressive Disorders:  Major Depressive Disorder - Severe (296.23) Total Time spent with patient: 30 minutes  Axis I: Substance Induced Mood Disorder  ADL's:  Intact  Sleep: Poor  Appetite:  Poor  Suicidal Ideation:  Plan:  denies Intent:  denies Means:  denies Homicidal Ideation:  Plan:  denies Intent:  denies Means:  denies AEB (as evidenced by):  Psychiatric Specialty Exam: Physical Exam  Review of Systems  Constitutional: Positive for malaise/fatigue.  HENT: Negative.   Eyes: Negative.   Respiratory: Negative.   Cardiovascular: Negative.   Gastrointestinal: Positive for constipation.  Genitourinary: Negative.   Musculoskeletal: Positive for back pain.  Skin: Negative.   Neurological: Positive for dizziness and weakness.  Endo/Heme/Allergies: Negative.   Psychiatric/Behavioral: Positive for depression and substance abuse. The patient is nervous/anxious and has insomnia.     Blood pressure 103/74, pulse 72, temperature 97.8 F (36.6 C), temperature source Oral, resp. rate 20, height 5\' 7"  (1.702 m), weight 112.492 kg (248 lb), SpO2 97.00%.Body mass index is 38.83 kg/(m^2).  General Appearance: Fairly Groomed  Engineer, water::  Fair  Speech:  Clear and Coherent  Volume:  Decreased  Mood:  Anxious and Depressed  Affect:  anxious, worried  Thought Process:  Coherent and Goal  Directed  Orientation:  Full (Time, Place, and Person)  Thought Content:  worries, concerns, symptoms  Suicidal Thoughts:  No  Homicidal Thoughts:  No  Memory:  Immediate;   Fair Recent;   Fair Remote;   Fair  Judgement:  Fair  Insight:  Present  Psychomotor Activity:  Normal  Concentration:  Fair  Recall:  [employer] [place]  Language: Fair  Akathisia:  No  Handed:    AIMS (if indicated):     Assets:  Desire for Improvement  Sleep:  Number of Hours: 4.25   Musculoskeletal: Strength & Muscle Tone: within normal limits Gait & Station: normal Patient leans: N/A  Current Medications: Current Facility-Administered Medications  Medication Dose Route Frequency Provider Last Rate Last Dose  . acetaminophen (TYLENOL) tablet 650 mg  650 mg Oral Q6H PRN Dara Hoyer, PA-C      . albuterol (PROVENTIL HFA;VENTOLIN HFA) 108 (90 BASE) MCG/ACT inhaler 2 puff  2 puff Inhalation Q4H PRN Dara Hoyer, PA-C   2 puff at 05/11/14 1659  . albuterol (PROVENTIL) (2.5 MG/3ML) 0.083% nebulizer solution 2.5 mg  2.5 mg Nebulization Q6H PRN Encarnacion Slates, NP   2.5 mg at 05/07/14 1645  . amoxicillin-clavulanate (AUGMENTIN) 875-125 MG per tablet 1 tablet  1 tablet Oral Q12H Encarnacion Slates, NP   1 tablet at 05/13/14 0750  . docusate sodium (COLACE) capsule 100 mg  100 mg Oral BID Nicholaus Bloom, MD   100 mg at 05/13/14 0750  . DULoxetine (CYMBALTA) DR capsule 30 mg  30 mg Oral Daily Dara Hoyer, PA-C   30 mg at 05/13/14 0750  . feeding supplement (RESOURCE BREEZE) (RESOURCE BREEZE)  liquid 1 Container  1 Container Oral BID WC Nicholaus Bloom, MD   1 Container at 05/13/14 0800  . gabapentin (NEURONTIN) capsule 300 mg  300 mg Oral TID Encarnacion Slates, NP   300 mg at 05/13/14 1201  . ibuprofen (ADVIL,MOTRIN) tablet 600 mg  600 mg Oral Q8H PRN Dara Hoyer, PA-C   600 mg at 05/12/14 1039  . lithium carbonate capsule 300 mg  300 mg Oral BID WC Nicholaus Bloom, MD   300 mg at 05/13/14 0750  .  magnesium hydroxide (MILK OF MAGNESIA) suspension 30 mL  30 mL Oral Daily PRN Dara Hoyer, PA-C   30 mL at 05/06/14 1126  . multivitamin with minerals tablet 1 tablet  1 tablet Oral Daily Dara Hoyer, PA-C   1 tablet at 05/13/14 0750  . OLANZapine (ZYPREXA) tablet 5 mg  5 mg Oral BID Nicholaus Bloom, MD      . OLANZapine zydis [facility]) disintegrating tablet 20 mg  20 mg Oral QHS Nicholaus Bloom, MD      . ondansetron [facility]) tablet 4 mg  4 mg Oral Q8H PRN Dara Hoyer, PA-C   4 mg at 05/10/14 1124  . polyethylene glycol (MIRALAX / GLYCOLAX) packet 17 g  17 g Oral Daily Nicholaus Bloom, MD   17 g at 05/13/14 0750  . propranolol (INDERAL) tablet 20 mg  20 mg Oral TID Nicholaus Bloom, MD   20 mg at 05/13/14 1201  . thiamine (B-1) injection 100 mg  100 mg Intramuscular Once Dara Hoyer, PA-C      . thiamine (VITAMIN B-1) tablet 100 mg  100 mg Oral Daily Dara Hoyer, PA-C   100 mg at 05/13/14 0751  . thyroid (ARMOUR) tablet 30 mg  30 mg Oral QAC breakfast Nicholaus Bloom, MD   30 mg at 05/13/14 0603  . traZODone (DESYREL) tablet 100 mg  100 mg Oral QHS,MR X 1 Spencer E Simon, PA-C   100 mg at 05/13/14 0151    Lab Results: No results found for this or any previous visit (from the past 48 hour(s)).  Physical Findings: AIMS: Facial and Oral Movements Muscles of Facial Expression: None, normal Lips and Perioral Area: None, normal Jaw: None, normal Tongue: None, normal,Extremity Movements Upper (arms, wrists, hands, fingers): None, normal Lower (legs, knees, ankles, toes): None, normal, Trunk Movements Neck, shoulders, hips: None, normal, Overall Severity Severity of abnormal movements (highest score from questions above): None, normal Incapacitation due to abnormal movements: None, normal Patient's awareness of abnormal movements (rate only patient's report): No Awareness, Dental Status Current problems with teeth and/or dentures?: No Does patient usually wear dentures?: No  CIWA:   CIWA-Ar Total: 4 COWS:     Treatment Plan Summary: Daily contact with patient to assess and evaluate symptoms and progress in treatment Medication management  Plan: Supportive approach/coping skills/relapse prevention           Will increase the Zyprexa to 5 mg BID, Zydis to 20 mg HS             Medical Decision Making Problem Points:  Established problem, worsening (2) and Review of psycho-social stressors (1) Data Points:  Review of new medications or change in dosage (2)  I certify that inpatient services furnished can reasonably be expected to improve the patient's condition.   Nicholaus Bloom 05/13/2014, 3:08 PM

## 2014-05-13 NOTE — Progress Notes (Signed)
Patient in her bed awake at the beginning of this shift. Her mood and affect sad and depressed. Patient stated that her boy friend suppose to visit tonight but he is not coming, so she's sad about that. 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 She reported limited ability to focus and concentrate and still have some tremors. She denied SI/HI and denied hallucinations. Writer encouraged and supported patient. Q 15 minute check continues as ordered to maintain safety.

## 2014-05-13 NOTE — BHH Group Notes (Signed)
[place] LCSW Group Therapy  05/13/2014 3:03 PM  Type of Therapy:  Group Therapy  Participation Level:  Did Not Attend-pt in room sleeping during today's group.   Shimika Ames Smart LCSWA  05/13/2014, 3:03 PM

## 2014-05-13 NOTE — Progress Notes (Signed)
The focus of this group is to educate the patient on the purpose and policies of crisis stabilization and provide a format to answer questions about their admission.  The group details unit policies and expectations of patients while admitted. Patient attended this group and had a lot of questions to ask and had to be redirected to the focus of this group.

## 2014-05-14 MED ORDER — LORATADINE 10 MG PO TABS
10.0000 mg | ORAL_TABLET | Freq: Every day | ORAL | Status: DC
  Administered 2014-05-14 – 2014-05-16 (×3): 10 mg via ORAL
  Filled 2014-05-14 (×5): qty 1

## 2014-05-14 MED ORDER — PROPRANOLOL HCL 20 MG PO TABS
20.0000 mg | ORAL_TABLET | Freq: Two times a day (BID) | ORAL | Status: DC
  Administered 2014-05-15 – 2014-05-16 (×3): 20 mg via ORAL
  Filled 2014-05-14: qty 1
  Filled 2014-05-14: qty 8
  Filled 2014-05-14: qty 1
  Filled 2014-05-14: qty 8
  Filled 2014-05-14 (×3): qty 1

## 2014-05-14 MED ORDER — ALBUTEROL SULFATE (2.5 MG/3ML) 0.083% IN NEBU
2.5000 mg | INHALATION_SOLUTION | Freq: Four times a day (QID) | RESPIRATORY_TRACT | Status: DC
  Administered 2014-05-14 – 2014-05-16 (×5): 2.5 mg via RESPIRATORY_TRACT
  Filled 2014-05-14 (×8): qty 3

## 2014-05-14 NOTE — BHH Group Notes (Signed)
[place] LCSW Group Therapy  05/14/2014  1:15 PM   Type of Therapy:  Group Therapy  Participation Level:  Active  Participation Quality:  Attentive, Sharing and Supportive  Affect:  Appropriate   Cognitive:  Alert and Oriented  Insight:  Developing/Improving and Engaged  Engagement in Therapy:  Developing/Improving and Engaged  Modes of Intervention:  Clarification, Confrontation, Discussion, Education, Exploration, Limit-setting, Orientation, Problem-solving, Rapport Building, Art therapist, Socialization and Support  Summary of Progress/Problems: The topic for group today was emotional regulation.  This group focused on both positive and negative emotion identification and allowed group members to process ways to identify feelings, regulate negative emotions, and find healthy ways to manage internal/external emotions. Group members were asked to reflect on a time when their reaction to an emotion led to a negative outcome and explored how alternative responses using emotion regulation would have benefited them. Group members were also asked to discuss a time when emotion regulation was utilized when a negative emotion was experienced. Erin [name] was attentive and engaged throughout todays' therapy group. She shared that she struggles with anger. Erin [name] was able to identify what anger feels like to her both physically and mentally. Erin [name] shows progress in the group setting and improving insight AEB her ability to process how "going to church and communicating with my bf" will help her talk through her anger and avoid reacting negatively.   [place], [place]  05/14/2014  3:10 PM

## 2014-05-14 NOTE — Progress Notes (Signed)
Patient ID: Erin [name], female   DOB: 1966/11/17, 48 y.o.   MRN: [id number] [facility] MD Progress Note  05/14/2014 5:13 PM Erin [name]  MRN:  [id number] Subjective:  Erin [name] states that she did  sleep better last night. She was able to fall asleep and stay asleep longer. In the morning felt ready to wake up. The Zyprexa during the day is helping to sty calm. She would like to stay in this combination. Still with some respiratory sympotms. She continues to endorse problems with the constipation Diagnosis:   DSM5: Schizophrenia Disorders:  none Obsessive-Compulsive Disorders:  none Trauma-Stressor Disorders:  none Substance/Addictive Disorders:  Alcohol Related Disorder - Severe (303.90), Cocaine related disorder Depressive Disorders:  Major Depressive Disorder - Severe (296.23) Total Time spent with patient: 30 minutes  Axis I: Substance Induced Mood Disorder  ADL's:  Intact  Sleep: Poor  Appetite:  Poor  Suicidal Ideation:  Plan:  denies Intent:  denies Means:  denies Homicidal Ideation:  Plan:  denies Intent:  denies Means:  denies AEB (as evidenced by):  Psychiatric Specialty Exam: Physical Exam  Review of Systems  Constitutional: Positive for malaise/fatigue.  HENT: Negative.   Eyes: Negative.   Respiratory:       Congestion  Cardiovascular: Negative.   Gastrointestinal: Positive for constipation.  Genitourinary: Negative.   Musculoskeletal: Positive for back pain.  Skin: Negative.   Neurological: Positive for weakness.  Endo/Heme/Allergies: Negative.   Psychiatric/Behavioral: Positive for depression and substance abuse. The patient is nervous/anxious.     Blood pressure 97/63, pulse 66, temperature 97.1 F (36.2 C), temperature source Oral, resp. rate 18, height 5\' 7"  (1.702 m), weight 112.492 kg (248 lb), SpO2 97.00%.Body mass index is 38.83 kg/(m^2).  General Appearance: Fairly Groomed  Engineer, water::  Fair  Speech:  Clear and Coherent  Volume:   Decreased  Mood:  Anxious and Depressed  Affect:  anxious, worried  Thought Process:  Coherent and Goal Directed  Orientation:  Full (Time, Place, and Person)  Thought Content:  worries, concerns, symptoms  Suicidal Thoughts:  No  Homicidal Thoughts:  No  Memory:  Immediate;   Fair Recent;   Fair Remote;   Fair  Judgement:  Fair  Insight:  Present  Psychomotor Activity:  Normal  Concentration:  Fair  Recall:  [employer] [place]  Language: Fair  Akathisia:  No  Handed:    AIMS (if indicated):     Assets:  Desire for Improvement  Sleep:  Number of Hours: 5.75   Musculoskeletal: Strength & Muscle Tone: within normal limits Gait & Station: normal Patient leans: N/A  Current Medications: Current Facility-Administered Medications  Medication Dose Route Frequency Provider Last Rate Last Dose  . acetaminophen (TYLENOL) tablet 650 mg  650 mg Oral Q6H PRN Dara Hoyer, PA-C      . albuterol (PROVENTIL HFA;VENTOLIN HFA) 108 (90 BASE) MCG/ACT inhaler 2 puff  2 puff Inhalation Q4H PRN Dara Hoyer, PA-C   2 puff at 05/11/14 1659  . albuterol (PROVENTIL) (2.5 MG/3ML) 0.083% nebulizer solution 2.5 mg  2.5 mg Nebulization QID Nicholaus Bloom, MD   2.5 mg at 05/14/14 1518  . docusate sodium (COLACE) capsule 100 mg  100 mg Oral BID Nicholaus Bloom, MD   100 mg at 05/14/14 1624  . DULoxetine (CYMBALTA) DR capsule 30 mg  30 mg Oral Daily Dara Hoyer, PA-C   30 mg at 05/14/14 9470  . feeding supplement (RESOURCE BREEZE) (RESOURCE BREEZE) liquid  1 Container  1 Container Oral BID WC Nicholaus Bloom, MD   1 Container at 05/14/14 1700  . gabapentin (NEURONTIN) capsule 300 mg  300 mg Oral TID Encarnacion Slates, NP   300 mg at 05/14/14 1624  . ibuprofen (ADVIL,MOTRIN) tablet 600 mg  600 mg Oral Q8H PRN Dara Hoyer, PA-C   600 mg at 05/12/14 1039  . lithium carbonate capsule 300 mg  300 mg Oral BID WC Nicholaus Bloom, MD   300 mg at 05/14/14 1624  . loratadine (CLARITIN) tablet 10 mg  10 mg  Oral Daily Nicholaus Bloom, MD   10 mg at 05/14/14 1516  . magnesium hydroxide (MILK OF MAGNESIA) suspension 30 mL  30 mL Oral Daily PRN Dara Hoyer, PA-C   30 mL at 05/06/14 1126  . multivitamin with minerals tablet 1 tablet  1 tablet Oral Daily Dara Hoyer, PA-C   1 tablet at 05/14/14 0626  . OLANZapine (ZYPREXA) tablet 5 mg  5 mg Oral BID Nicholaus Bloom, MD   5 mg at 05/14/14 0754  . OLANZapine zydis (ZYPREXA) disintegrating tablet 20 mg  20 mg Oral QHS Nicholaus Bloom, MD   20 mg at 05/13/14 2148  . ondansetron (ZOFRAN) tablet 4 mg  4 mg Oral Q8H PRN Dara Hoyer, PA-C   4 mg at 05/10/14 1124  . polyethylene glycol (MIRALAX / GLYCOLAX) packet 17 g  17 g Oral Daily Nicholaus Bloom, MD   17 g at 05/14/14 0750  . [START ON 05/15/2014] propranolol (INDERAL) tablet 20 mg  20 mg Oral BID Encarnacion Slates, NP      . thiamine (B-1) injection 100 mg  100 mg Intramuscular Once Dara Hoyer, PA-C      . thiamine (VITAMIN B-1) tablet 100 mg  100 mg Oral Daily Dara Hoyer, PA-C   100 mg at 05/14/14 9485  . thyroid (ARMOUR) tablet 30 mg  30 mg Oral QAC breakfast Nicholaus Bloom, MD   30 mg at 05/14/14 [phone]  . traZODone (DESYREL) tablet 100 mg  100 mg Oral QHS,MR X 1 Laverle Hobby, PA-C   100 mg at 05/13/14 2148    Lab Results: No results found for this or any previous visit (from the past 48 hour(s)).  Physical Findings: AIMS: Facial and Oral Movements Muscles of Facial Expression: None, normal Lips and Perioral Area: None, normal Jaw: None, normal Tongue: None, normal,Extremity Movements Upper (arms, wrists, hands, fingers): None, normal Lower (legs, knees, ankles, toes): None, normal, Trunk Movements Neck, shoulders, hips: None, normal, Overall Severity Severity of abnormal movements (highest score from questions above): None, normal Incapacitation due to abnormal movements: None, normal Patient's awareness of abnormal movements (rate only patient's report): No Awareness, Dental  Status Current problems with teeth and/or dentures?: No Does patient usually wear dentures?: No  CIWA:  CIWA-Ar Total: 4 COWS:     Treatment Plan Summary: Daily contact with patient to assess and evaluate symptoms and progress in treatment Medication management  Plan: Supportive approach/coping skills/relapse prevention           Continue Zyprexa 5 mg BID, Zydis 20 mg HS and optimize response           NP to address respiratory symptoms             Medical Decision Making Problem Points:  Established problem, worsening (2) and Review of psycho-social stressors (1) Data Points:  Review of new medications  or change in dosage (2)  I certify that inpatient services furnished can reasonably be expected to improve the patient's condition.   Nicholaus Bloom 05/14/2014, 5:13 PM

## 2014-05-14 NOTE — Progress Notes (Signed)
Attended group 
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
## 2014-05-14 NOTE — ED Provider Notes (Signed)
Medical screening examination/treatment/procedure(s) were conducted as a shared visit with non-physician practitioner(s) and myself.  I personally evaluated the patient during the encounter.   EKG Interpretation None      Pt is a 49 y.o. F with bipolar who presents to ED from [facility] with several days of abdominal pain and not having a bowel movement. She has had a bowel movement in the ED and is feeling much better. Her abdominal exam is completely benign. Her lipase is slightly elevated but given her benign exam the fact that she is able to tolerate by mouth, I feel she is safe to be discharged home. She is currently receiving detox for narcotic abuse and I do not feel is appropriate to discharge her with narcotic medications. Have discussed strict return precautions. She verbalizes understanding is that she is comfortable with this plan  [place], DO 05/14/14 1501

## 2014-05-14 NOTE — BHH Group Notes (Signed)
[facility] LCSW Aftercare Discharge Planning Group Note   05/14/2014 10:09 AM  Participation Quality: Appropriate     Mood/Affect:  Flat  Depression Rating:  8  Anxiety Rating:  8  Thoughts of Suicide:  No Will you contract for safety?   NA  Current AVH:  No  Plan for Discharge/Comments:  Pt reports that she is feeling bad today. Poor sleep. Has appt with [facility]. Pt plans to return home at d/c   Transportation Means: boyfriend   Supports: boyfriend   Civil engineer, contracting

## 2014-05-14 NOTE — Progress Notes (Signed)
Adult Psychoeducational Group Note  Date:  05/14/2014 Time:  12:13 AM  Group Topic/Focus:  Wrap-Up Group:   The focus of this group is to help patients review their daily goal of treatment and discuss progress on daily workbooks.  Participation Level:  Minimal  Participation Quality:  Drowsy, Sharing and Supportive  Affect:  Depressed  Cognitive:  Alert and Appropriate  Insight: Appropriate  Engagement in Group:  Limited  Modes of Intervention:  Discussion  Additional Comments:  Patient stated that she feels bad due to the recent medication change but overall feels good.  Margart Sickles Kallan Merrick 05/14/2014, 12:13 AM

## 2014-05-14 NOTE — Progress Notes (Signed)
Pt has been up and active in the milieu today. She rated all her depression hopelessness and anxiety an 8 on her self-inventory.  She denies any S/H ideation or A/V/H.  Pt unsure of her discharge plan at this time.

## 2014-05-15 DIAGNOSIS — F39 Unspecified mood [affective] disorder: Secondary | ICD-10-CM

## 2014-05-15 NOTE — BHH Group Notes (Signed)
[place] LCSW Group Therapy  05/15/2014 3:00 PM  Type of Therapy:  Group Therapy  Participation Level:  Did Not Attend-pt in room sleeping/did not attend   [place]  05/15/2014, 3:00 PM

## 2014-05-15 NOTE — Progress Notes (Signed)
Patient ID: ADNA NOFZIGER, female   DOB: 12-05-1966, 48 y.o.   MRN: [id number] D: Patient reports feeling a lot better on her medications. Pt reports discharging tomorrow and [facility]. Pt report confidence about compliant with medications and continual sobriety.  Pt mood and affect appeared depressed and anxious. Pt denies symptoms of withdrawals. Pt denies SI/HI/AVH and pain. Pt did not attend evening karaoke because of loud noise. Pt appropriate and cooperative with assessment. No acute distressed noted at this time.   A: Met with pt 1:1. Medications administered as prescribed. Writer encouraged pt to discuss feelings. Pt encouraged to come to staff with any question or concerns.   R: Patient remains safe. She is complaint with medications and denies any adverse reaction. Continue current POC.

## 2014-05-15 NOTE — Progress Notes (Signed)
Patient ID: Erin [name], female   DOB: 16-May-1966, 48 y.o.   MRN: [id number] D: Patient in room on approach. Pt report having a good day. Pt states her medication is improving her mood. Pt mood and affect appeared depressed and flat. Pt denies symptoms of withdrawals. Pt denies SI/HI/AVH and pain. Pt attended evening NA group and engage in discussion. Pt denies any needs or concerns.  Cooperative with assessment. No acute distressed noted at this time.   A: Met with pt 1:1. Medications administered as prescribed. Writer encouraged pt to discuss feelings. Pt encouraged to come to staff with any question or concerns.   R: Patient remains safe. She is complaint with medications and denies any adverse reaction. Continue current POC.

## 2014-05-15 NOTE — BHH Group Notes (Signed)
0900 nursing orientation group   The focus of this group is to educate the patient on the purpose and policies of crisis stabilization and provide a format to answer questions about their admission.  The group details unit policies and expectations of patients while admitted.   Pt was an active participant in group.  She was appropriate but had to be redirected a couple of times for interrupting the group.  She did apologize to the group

## 2014-05-15 NOTE — Progress Notes (Signed)
[facility] MD Progress Note  05/15/2014 3:22 PM Erin [name]  MRN:  [id number] Subjective:  Overall starting to feel better. States that the Zyprexa kept her asleep all night. States she is waking up alert. The dosages during the day make her feel more calm without being groggy and has seen a decrease in appetite since she switched to Zyprexa from Seroquel.  Diagnosis:   DSM5: Schizophrenia Disorders:  none Obsessive-Compulsive Disorders:  none Trauma-Stressor Disorders:  none Substance/Addictive Disorders:  Alcohol Related Disorder - Severe (303.90), Cocaine related disorder Depressive Disorders:  Major Depressive Disorder - Moderate (296.22) Total Time spent with patient: 30 minutes  Axis I: Mood Disorder NOS and Substance Induced Mood Disorder  ADL's:  Intact  Sleep: Fair  Appetite:  Fair  Suicidal Ideation:  Plan:  denies Intent:  denies Means:  denies Homicidal Ideation:  Plan:  denies Intent:  denies Means:  denies AEB (as evidenced by):  Psychiatric Specialty Exam: Physical Exam  Review of Systems  Constitutional: Negative.   HENT: Negative.   Eyes: Negative.   Respiratory: Negative.   Cardiovascular: Negative.   Gastrointestinal: Negative.   Genitourinary: Negative.   Musculoskeletal: Positive for back pain.  Skin: Negative.   Neurological: Negative.   Endo/Heme/Allergies: Negative.   Psychiatric/Behavioral: Positive for substance abuse. The patient is nervous/anxious.     Blood pressure 96/65, pulse 80, temperature 97.2 F (36.2 C), temperature source Oral, resp. rate 16, height 5\' 7"  (1.702 m), weight 112.492 kg (248 lb), SpO2 97.00%.Body mass index is 38.83 kg/(m^2).  General Appearance: Fairly Groomed  Engineer, water::  Fair  Speech:  Clear and Coherent  Volume:  Decreased  Mood:  Anxious  Affect:  Restricted  Thought Process:  Coherent and Goal Directed  Orientation:  Full (Time, Place, and Person)  Thought Content:  symtpoms, worries concerns as she  anticipated moving on  Suicidal Thoughts:  No  Homicidal Thoughts:  No  Memory:  Immediate;   Fair Recent;   Fair Remote;   Fair  Judgement:  Fair  Insight:  Present  Psychomotor Activity:  Restlessness  Concentration:  Fair  Recall:  [employer] of Knowledge:NA  Language: Fair  Akathisia:  No  Handed:    AIMS (if indicated):     Assets:  Desire for Improvement Housing Social Support Transportation  Sleep:  Number of Hours: 5.25   Musculoskeletal: Strength & Muscle Tone: within normal limits Gait & Station: normal Patient leans: N/A  Current Medications: Current Facility-Administered Medications  Medication Dose Route Frequency Provider Last Rate Last Dose  . acetaminophen (TYLENOL) tablet 650 mg  650 mg Oral Q6H PRN Dara Hoyer, PA-C      . albuterol (PROVENTIL HFA;VENTOLIN HFA) 108 (90 BASE) MCG/ACT inhaler 2 puff  2 puff Inhalation Q4H PRN Dara Hoyer, PA-C   2 puff at 05/11/14 1659  . albuterol (PROVENTIL) (2.5 MG/3ML) 0.083% nebulizer solution 2.5 mg  2.5 mg Nebulization QID Nicholaus Bloom, MD   2.5 mg at 05/15/14 0814  . docusate sodium (COLACE) capsule 100 mg  100 mg Oral BID Nicholaus Bloom, MD   100 mg at 05/15/14 0815  . DULoxetine (CYMBALTA) DR capsule 30 mg  30 mg Oral Daily Dara Hoyer, PA-C   30 mg at 05/15/14 0816  . feeding supplement (RESOURCE BREEZE) (RESOURCE BREEZE) liquid 1 Container  1 Container Oral BID WC Nicholaus Bloom, MD   1 Container at 05/15/14 [phone]  . gabapentin (NEURONTIN) capsule 300 mg  300 mg Oral TID Encarnacion Slates, NP   300 mg at 05/15/14 1212  . ibuprofen (ADVIL,MOTRIN) tablet 600 mg  600 mg Oral Q8H PRN Dara Hoyer, PA-C   600 mg at 05/12/14 1039  . lithium carbonate capsule 300 mg  300 mg Oral BID WC Nicholaus Bloom, MD   300 mg at 05/15/14 0818  . loratadine (CLARITIN) tablet 10 mg  10 mg Oral Daily Nicholaus Bloom, MD   10 mg at 05/15/14 2440  . magnesium hydroxide (MILK OF MAGNESIA) suspension 30 mL  30 mL Oral Daily PRN Dara Hoyer, PA-C   30 mL at 05/06/14 1126  . multivitamin with minerals tablet 1 tablet  1 tablet Oral Daily Dara Hoyer, PA-C   1 tablet at 05/15/14 [phone]  . OLANZapine (ZYPREXA) tablet 5 mg  5 mg Oral BID Nicholaus Bloom, MD   5 mg at 05/15/14 0816  . OLANZapine zydis (ZYPREXA) disintegrating tablet 20 mg  20 mg Oral QHS Nicholaus Bloom, MD   20 mg at 05/14/14 2143  . ondansetron (ZOFRAN) tablet 4 mg  4 mg Oral Q8H PRN Dara Hoyer, PA-C   4 mg at 05/10/14 1124  . polyethylene glycol (MIRALAX / GLYCOLAX) packet 17 g  17 g Oral Daily Nicholaus Bloom, MD   17 g at 05/15/14 [phone]  . propranolol (INDERAL) tablet 20 mg  20 mg Oral BID Encarnacion Slates, NP   20 mg at 05/15/14 0815  . thiamine (B-1) injection 100 mg  100 mg Intramuscular Once Dara Hoyer, PA-C      . thiamine (VITAMIN B-1) tablet 100 mg  100 mg Oral Daily Dara Hoyer, PA-C   100 mg at 05/15/14 6440  . thyroid (ARMOUR) tablet 30 mg  30 mg Oral QAC breakfast Nicholaus Bloom, MD   30 mg at 05/15/14 0605  . traZODone (DESYREL) tablet 100 mg  100 mg Oral QHS,MR X 1 Laverle Hobby, PA-C   100 mg at 05/14/14 2349    Lab Results: No results found for this or any previous visit (from the past 48 hour(s)).  Physical Findings: AIMS: Facial and Oral Movements Muscles of Facial Expression: None, normal Lips and Perioral Area: None, normal Jaw: None, normal Tongue: None, normal,Extremity Movements Upper (arms, wrists, hands, fingers): None, normal Lower (legs, knees, ankles, toes): None, normal, Trunk Movements Neck, shoulders, hips: None, normal, Overall Severity Severity of abnormal movements (highest score from questions above): None, normal Incapacitation due to abnormal movements: None, normal Patient's awareness of abnormal movements (rate only patient's report): No Awareness, Dental Status Current problems with teeth and/or dentures?: No Does patient usually wear dentures?: No  CIWA:  CIWA-Ar Total: 4 COWS:     Treatment Plan  Summary: Daily contact with patient to assess and evaluate symptoms and progress in treatment Medication management  Plan: Supportive approach/coping skills/relapse prevention           Optimize treatment with psychotropics             Medical Decision Making Problem Points:  Established problem, stable/improving (1) and Review of psycho-social stressors (1) Data Points:  Review of medication regiment & side effects (2) Review of new medications or change in dosage (2)  I certify that inpatient services furnished can reasonably be expected to improve the patient's condition.   Nicholaus Bloom 05/15/2014, 3:22 PM

## 2014-05-15 NOTE — Progress Notes (Signed)
Pt did not attend group this evening.  

## 2014-05-15 NOTE — Progress Notes (Signed)
Pt has been up and active in the milieu today.  She rated her depression and hopelessness 6 and her anxiety a 3 on her self-inventory. She denies any S/H ideation or A/V/H.

## 2014-05-16 MED ORDER — PROPRANOLOL HCL 20 MG PO TABS: 20.0000 mg | ORAL_TABLET | Freq: Two times a day (BID) | ORAL | Status: DC

## 2014-05-16 MED ORDER — LITHIUM CARBONATE 300 MG PO CAPS: 300.0000 mg | ORAL_CAPSULE | Freq: Two times a day (BID) | ORAL | Status: DC

## 2014-05-16 MED ORDER — GABAPENTIN 300 MG PO CAPS: 300.0000 mg | ORAL_CAPSULE | Freq: Three times a day (TID) | ORAL | Status: DC

## 2014-05-16 MED ORDER — OLANZAPINE 5 MG PO TABS: 5.0000 mg | ORAL_TABLET | Freq: Two times a day (BID) | ORAL | Status: DC

## 2014-05-16 MED ORDER — LORATADINE 10 MG PO TABS: 10.0000 mg | ORAL_TABLET | Freq: Every day | ORAL | Status: DC

## 2014-05-16 MED ORDER — THYROID 30 MG PO TABS: 30.0000 mg | ORAL_TABLET | Freq: Every day | ORAL | Status: DC

## 2014-05-16 MED ORDER — POLYETHYLENE GLYCOL 3350 17 G PO PACK: 17.0000 g | PACK | Freq: Every day | ORAL | Status: DC

## 2014-05-16 MED ORDER — DULOXETINE HCL 30 MG PO CPEP: 30.0000 mg | ORAL_CAPSULE | Freq: Every day | ORAL | Status: DC

## 2014-05-16 MED ORDER — DOCUSATE SODIUM 100 MG PO CAPS: 100.0000 mg | ORAL_CAPSULE | Freq: Two times a day (BID) | ORAL | Status: DC

## 2014-05-16 MED ORDER — ALBUTEROL SULFATE HFA 108 (90 BASE) MCG/ACT IN AERS: 1.0000 | INHALATION_SPRAY | Freq: Four times a day (QID) | RESPIRATORY_TRACT | Status: DC | PRN

## 2014-05-16 MED ORDER — ALBUTEROL SULFATE (2.5 MG/3ML) 0.083% IN NEBU: 2.5000 mg | INHALATION_SOLUTION | Freq: Four times a day (QID) | RESPIRATORY_TRACT | Status: DC | PRN

## 2014-05-16 MED ORDER — TRAZODONE HCL 100 MG PO TABS: 100.0000 mg | ORAL_TABLET | Freq: Every evening | ORAL | Status: DC | PRN

## 2014-05-16 NOTE — BHH Group Notes (Signed)
[facility] LCSW Aftercare Discharge Planning Group Note   05/16/2014 10:21 AM  Participation Quality:  Appropriate   Mood/Affect:  Appropriate  Depression Rating:  3  Anxiety Rating:  7-8  Thoughts of Suicide:  No Will you contract for safety?   NA  Current AVH:  No  Plan for Discharge/Comments:  Pt plans to [facility] for med management, saiop, and therapy. She will return home at d.c   Transportation Means: boyfriend coming at [place]: boyfriend   Civil engineer, contracting

## 2014-05-16 NOTE — Progress Notes (Signed)
[facility] Adult Case Management Discharge Plan :  Will you be returning to the same living situation after discharge: Yes,  home At discharge, do you have transportation home?:Yes,  boyfriend Do you have the ability to pay for your medications:Yes,  Medicare and medicaid  Release of information consent forms completed and submitted to Medical Records by CSW.  Patient to Follow up at: Follow-up Information   Follow up with [place] On 05/20/2014. (Appt. at 12PM with Mr. [facility] for assessment (med management/therapy/SA IOP). Arrive 15 minutes early with Medicaid/Medicare cards. If you must cancel/reschedule, please call at by Friday, 5/22 to do so. )    Contact information:   [address] [place], [place] [address] Phone: [phone] Fax; [phone]      Patient denies SI/HI:   Yes,  during group/self report.     Safety Planning and Suicide Prevention discussed:  Yes,  Pt refused to consent to family contact. SPE completed with pt and she was encouraged to share information with support network, ask questions, and talk about any concerns relating to SPE.  Nikki Rusnak Smart LCSWA 05/16/2014, 11:18 AM

## 2014-05-16 NOTE — BHH Suicide Risk Assessment (Signed)
Suicide Risk Assessment  Discharge Assessment     Demographic Factors:  Caucasian  Total Time spent with patient: 30 minutes  Psychiatric Specialty Exam:     Blood pressure 105/71, pulse 81, temperature 98 F (36.7 C), temperature source Oral, resp. rate 20, height 5\' 7"  (1.702 m), weight 112.492 kg (248 lb), SpO2 97.00%.Body mass index is 38.83 kg/(m^2).  General Appearance: Fairly Groomed  Engineer, water::  Fair  Speech:  Clear and Coherent  Volume:  Normal  Mood:  Anxious  Affect:  Appropriate  Thought Process:  Coherent and Goal Directed  Orientation:  Full (Time, Place, and Person)  Thought Content:  plans as she moves on, relapse prevention plan  Suicidal Thoughts:  No  Homicidal Thoughts:  No  Memory:  Immediate;   Fair Recent;   Fair Remote;   Fair  Judgement:  Fair  Insight:  Present and Shallow  Psychomotor Activity:  Normal  Concentration:  Fair  Recall:  [employer] of Knowledge:NA  Language: Fair  Akathisia:  No  Handed:    AIMS (if indicated):     Assets:  Desire for Improvement Housing Social Support Transportation  Sleep:  Number of Hours: 4.25    Musculoskeletal: Strength & Muscle Tone: within normal limits Gait & Station: normal Patient leans: N/A   Mental Status Per Nursing Assessment::   On Admission:  NA  Current Mental Status by Physician: In fill contact with reality. There are no active S/S of withdrawal. No active SI plans or intent. Willing and motivated to pursue outpatient treatment. Will be following up with the [facility] and attend to all her physical needs   Loss Factors: Decline in physical health  Historical Factors: NA  Risk Reduction Factors:   Positive social support  Continued Clinical Symptoms:  Depression:   Comorbid alcohol abuse/dependence Alcohol/Substance Abuse/Dependencies  Cognitive Features That Contribute To Risk:  Closed-mindedness Polarized thinking Thought constriction (tunnel  vision)    Suicide Risk:  Minimal: No identifiable suicidal ideation.  Patients presenting with no risk factors but with morbid ruminations; may be classified as minimal risk based on the severity of the depressive symptoms  Discharge Diagnoses:   AXIS I:  Alcohol Dependence, Cocaine Dependence, Opioid Dependence, Bipolar Disorder AXIS II:  No diagnosis AXIS III:   Past Medical History  Diagnosis Date  . Hypothyroidism   . Blood transfusion July 2012  . Depression   . Anxiety   . Bipolar 1 disorder   . Menorrhagia   . Liver failure   . Bronchitis   . Cirrhosis of liver   . Wegener's granulomatosis   . History of sarcoidosis    AXIS IV:  other psychosocial or environmental problems AXIS V:  61-70 mild symptoms  Plan Of Care/Follow-up recommendations:  Activity:  as tolerated Diet:  as per dietitian Follow up outpatient clinic Is patient on multiple antipsychotic therapies at discharge:  No   Has Patient had three or more failed trials of antipsychotic monotherapy by history:  No  Recommended Plan for Multiple Antipsychotic Therapies: NA    Nicholaus Bloom 05/16/2014, 12:28 PM

## 2014-05-16 NOTE — Discharge Summary (Signed)
Physician Discharge Summary Note  Patient:  Erin [name] is an 48 y.o., female MRN:  [id number] DOB:  1966/03/16 Patient phone:  [phone] (home)  Patient address:   [address]  [id number] [place] [address],  Total Time spent with patient: Greater than 30 minutes  Date of Admission:  05/03/2014 Date of Discharge: 05/16/14  Reason for Admission: Opioid detox  Discharge Diagnoses: Active Problems:   Opioid dependence   Psychiatric Specialty Exam: Physical Exam  Psychiatric: She has a normal mood and affect. Her speech is normal and behavior is normal. Judgment and thought content normal. Cognition and memory are normal.    Review of Systems  Constitutional: Negative.   HENT: Negative.   Eyes: Negative.   Respiratory: Negative.   Cardiovascular: Negative.   Gastrointestinal: Negative.   Genitourinary: Negative.   Musculoskeletal: Negative.   Skin: Negative.   Neurological: Negative.   Endo/Heme/Allergies: Negative.   Psychiatric/Behavioral: Positive for depression (Stable) and substance abuse (Opioid dependence). Negative for suicidal ideas, hallucinations and memory loss. The patient has insomnia (Stable). The patient is not nervous/anxious.     Blood pressure 105/71, pulse 81, temperature 98 F (36.7 C), temperature source Oral, resp. rate 20, height 5\' 7"  (1.702 m), weight 112.492 kg (248 lb), SpO2 97.00%.Body mass index is 38.83 kg/(m^2).   General Appearance: Fairly Groomed   Engineer, water:: Fair   Speech: Clear and Coherent   Volume: Normal   Mood: Anxious   Affect: Appropriate   Thought Process: Coherent and Goal Directed   Orientation: Full (Time, Place, and Person)   Thought Content: plans as she moves on, relapse prevention plan   Suicidal Thoughts: No   Homicidal Thoughts: No   Memory: Immediate; Fair  Recent; Fair  Remote; Fair   Judgement: Fair   Insight: Present and Shallow   Psychomotor Activity: Normal   Concentration: Fair   Recall:  [employer] of Knowledge:NA   Language: Fair   Akathisia: No   Handed:   AIMS (if indicated):   Assets: Desire for Improvement  Housing  Social Support  Transportation   Sleep: Number of Hours: 4.25    Past Psychiatric History: Diagnosis: Opioid dependence, Bipolar 1 disorder  Hospitalizations: [facility] adult units  Outpatient Care: [facility]  Substance Abuse Care:  [facility]  Self-Mutilation: Denies  Suicidal Attempts: Denies  Violent Behaviors: NA   Musculoskeletal: Strength & Muscle Tone: within normal limits Gait & Station: normal Patient leans: N/A  DSM5: Schizophrenia Disorders:  NA Obsessive-Compulsive Disorders:  NA Trauma-Stressor Disorders:  NA Substance/Addictive Disorders:  Opioid Disorder - Severe (304.00) Depressive Disorders:  Bipolar 1 disorder  Axis Diagnosis:   AXIS I:  Opioid dependence, Bipolar 1 disorder AXIS II:  Deferred AXIS III:   Past Medical History  Diagnosis Date  . Hypothyroidism   . Blood transfusion July 2012  . Depression   . Anxiety   . Bipolar 1 disorder   . Menorrhagia   . Liver failure   . Bronchitis   . Cirrhosis of liver   . Wegener's granulomatosis   . History of sarcoidosis    AXIS IV:  Substance dependence issues AXIS V:  62  Level of Care:  OP  Hospital Course:  Female 48 years old was transferred from the ER with c/o of Binging on Alcohol and "every substance out there" She was last admitted for Alcohol dependence to our unit last July. Patient reported drinking 5th of Vodka and 12 beers daily in the  last 3 months. She could not quantify what she has been taking as far as drug is concerned. Patient was anxious and informed this writer" my liver is in trouble, I have been binge drinking for 3-4 months and I have been using every drug in the street".   Erin [name] was admitted to the hospital with her UDS report positive for Opiate. However, Erin [name] reported also that she has been abusing and binging on every substance  there is out there for the last 3 months including alcohol. She received Clonidine detox protocols. She also was medicated and discharged on Olanzapine 5 mg twice daily for mood control, Duloxetine 30 mg daily for depression, Gabapentin 300 mg three times daily for substance withdrawal syndrome/adjunct treatment for pain, lithium carbonate 300 mg twice daily for mood stabilization, Propranolol 20 twice daily for anxiety and Trazodone 100 mg Q bedtime for sleep. Erin [name] was enrolled in the group counseling sessions and AA/NA meetings being offered on this unit. She learned coping skills.  Erin [name] presented variety of complaints from chronic pain episodes to problem with constipation. She was transferred to the ED once for evaluation and possible treatment of her complaints. Her condition gradually improved from there on. Erin [name] has completed detox treatment and her mood stabilized. This is evidenced by her reports of improved mood and absence of withdrawal symptoms. She is currently being discharged to continue substance abuse [facility] here in [place], [place] including medication management. She has been provided with all the necessary information required to make this appointment without problems.  Upon discharge, Erin [name] adamantly denies any SIHI, AVH, delusional thoughts, paranoia and or withdrawal symptoms. She received from [facility] a 4 days worth, supply samples of her [facility] discharge medications. She left [facility] all personal belongings in no distress. Transportation per boyfriend.  Consults:  psychiatry  Significant Diagnostic Studies:  labs: CBC with diff, CMP, UDS, toxicology tests, U/A  Discharge Vitals:   Blood pressure 105/71, pulse 81, temperature 98 F (36.7 C), temperature source Oral, resp. rate 20, height 5\' 7"  (1.702 m), weight 112.492 kg (248 lb), SpO2 97.00%. Body mass index is 38.83 kg/(m^2). Lab Results:   No results found for this or any previous visit (from the past 72  hour(s)).  Physical Findings: AIMS: Facial and Oral Movements Muscles of Facial Expression: None, normal Lips and Perioral Area: None, normal Jaw: None, normal Tongue: None, normal,Extremity Movements Upper (arms, wrists, hands, fingers): None, normal Lower (legs, knees, ankles, toes): None, normal, Trunk Movements Neck, shoulders, hips: None, normal, Overall Severity Severity of abnormal movements (highest score from questions above): None, normal Incapacitation due to abnormal movements: None, normal Patient's awareness of abnormal movements (rate only patient's report): No Awareness, Dental Status Current problems with teeth and/or dentures?: No Does patient usually wear dentures?: No  CIWA:  CIWA-Ar Total: 4 COWS:     Psychiatric Specialty Exam: See Psychiatric Specialty Exam and Suicide Risk Assessment completed by Attending Physician prior to discharge.  Discharge destination:  Home  Is patient on multiple antipsychotic therapies at discharge:  No   Has Patient had three or more failed trials of antipsychotic monotherapy by history:  No  Recommended Plan for Multiple Antipsychotic Therapies: NA     Medication List       Indication   albuterol 108 (90 BASE) MCG/ACT inhaler  Commonly known as:  PROVENTIL HFA;VENTOLIN HFA  Inhale 1-2 puffs into the lungs every 6 (six) hours as needed for wheezing.   Indication:  Asthma  albuterol (2.5 MG/3ML) 0.083% nebulizer solution  Commonly known as:  PROVENTIL  Take 3 mLs (2.5 mg total) by nebulization every 6 (six) hours as needed for wheezing or shortness of breath.   Indication:  Acute Bronchospasm, Disease involving Spasms of the Lungs     docusate sodium 100 MG capsule  Commonly known as:  COLACE  Take 1 capsule (100 mg total) by mouth 2 (two) times daily. (This is an over the counter medicine): For constipation   Indication:  Constipation     DULoxetine 30 MG capsule  Commonly known as:  CYMBALTA  Take 1 capsule  (30 mg total) by mouth daily. For depression   Indication:  Major Depressive Disorder     gabapentin 300 MG capsule  Commonly known as:  NEURONTIN  Take 1 capsule (300 mg total) by mouth 3 (three) times daily. For substance withdrawal syndrome   Indication:  Substance withdrawal syndrome     lithium carbonate 300 MG capsule  Take 1 capsule (300 mg total) by mouth 2 (two) times daily with a meal. For mood stabilization   Indication:  Mood stabilization     loratadine 10 MG tablet  Commonly known as:  CLARITIN  Take 1 tablet (10 mg total) by mouth daily. (This is an over the counter medicine): For allergies   Indication:  Perennial Rhinitis, Hayfever     OLANZapine 5 MG tablet  Commonly known as:  ZYPREXA  Take 1 tablet (5 mg total) by mouth 2 (two) times daily. For mood control   Indication:  Mood control     polyethylene glycol packet  Commonly known as:  MIRALAX / GLYCOLAX  Take 17 g by mouth daily. For constipation   Indication:  Constipation     propranolol 20 MG tablet  Commonly known as:  INDERAL  Take 1 tablet (20 mg total) by mouth 2 (two) times daily. For anxiety   Indication:  Anxiety     sodium phosphate 7-19 GM/118ML Enem  Place 133 mLs (1 enema total) rectally daily as needed for severe constipation.      thyroid 30 MG tablet  Commonly known as:  ARMOUR  Take 1 tablet (30 mg total) by mouth daily before breakfast. For low thyroid function   Indication:  Underactive Thyroid     traZODone 100 MG tablet  Commonly known as:  DESYREL  Take 1 tablet (100 mg total) by mouth at bedtime and may repeat dose one time if needed. For sleep   Indication:  Trouble Sleeping       Follow-up Information   Follow up with [facility] On 05/20/2014. (Appt. at 12PM with Mr. [facility] for assessment (med management/therapy/SA IOP). Arrive 15 minutes early with Medicaid/Medicare cards. If you must cancel/reschedule, please call at by Friday, 5/22 to do so. )    Contact information:    [address] [place], [place] [address] Phone: [phone] Fax; [phone]     Follow-up recommendations:  Activity:  As tolerated Diet: As recommended by your primary care doctor. Keep all scheduled follow-up appointments as recommended.  Comments: Take all your medications as prescribed by your mental healthcare provider. Report any adverse effects and or reactions from your medicines to your outpatient provider promptly. Patient is instructed and cautioned to not engage in alcohol and or illegal drug use while on prescription medicines. In the event of worsening symptoms, patient is instructed to call the crisis hotline, 911 and or go to the nearest ED for appropriate evaluation and  treatment of symptoms. Follow-up with your primary care provider for your other medical issues, concerns and or health care needs.   Total Discharge Time:  Greater than 30 minutes.  Signed: Encarnacion Slates, PMHNP, FNP-BC 05/16/2014, 3:00 PM Personally assessed the patient, formulated the treatment plan Geralyn Flash A. Sabra Heck, M.D.

## 2014-05-16 NOTE — Progress Notes (Signed)
Patient ID: Erin [name], female   DOB: [birth year], 48 y.o.   MRN: [id number] Patient discharged per physician order; patient denies SI/HI and A/V hallucinations; patient received samples, prescriptions, and copy of AVS after it was reviewed; patient had no other questions or concerns at this time; patient verbalized and signed that she received all belongings; patient left the unit ambulatory

## 2014-05-21 NOTE — Progress Notes (Signed)
Patient Discharge Instructions:  After Visit Summary (AVS):   Faxed to:  05/21/14 Discharge Summary Note:   Faxed to:  05/21/14 Psychiatric Admission Assessment Note:   Faxed to:  05/21/14 Suicide Risk Assessment - Discharge Assessment:   Faxed to:  05/21/14 Faxed/Sent to the Next Level Care provider:  05/21/14 Faxed to [place] [facility], 05/21/2014, 3:42 PM

## 2014-05-26 ENCOUNTER — Encounter (HOSPITAL_COMMUNITY): Payer: Self-pay | Admitting: Emergency Medicine

## 2014-05-26 ENCOUNTER — Other Ambulatory Visit: Payer: Self-pay

## 2014-05-26 ENCOUNTER — Emergency Department (HOSPITAL_COMMUNITY)
Admission: EM | Admit: 2014-05-26 | Discharge: 2014-05-27 | Disposition: A | Discharge status: Home / Self Care | Payer: Medicare Other | Attending: Emergency Medicine | Admitting: Emergency Medicine

## 2014-05-26 DIAGNOSIS — Z8709 Personal history of other diseases of the respiratory system: Secondary | ICD-10-CM | POA: Insufficient documentation

## 2014-05-26 DIAGNOSIS — S1093XA Contusion of unspecified part of neck, initial encounter: Principal | ICD-10-CM

## 2014-05-26 DIAGNOSIS — F17200 Nicotine dependence, unspecified, uncomplicated: Secondary | ICD-10-CM | POA: Insufficient documentation

## 2014-05-26 DIAGNOSIS — Z79899 Other long term (current) drug therapy: Secondary | ICD-10-CM | POA: Insufficient documentation

## 2014-05-26 DIAGNOSIS — S7010XA Contusion of unspecified thigh, initial encounter: Secondary | ICD-10-CM

## 2014-05-26 DIAGNOSIS — S8010XA Contusion of unspecified lower leg, initial encounter: Secondary | ICD-10-CM | POA: Insufficient documentation

## 2014-05-26 DIAGNOSIS — W1809XA Striking against other object with subsequent fall, initial encounter: Secondary | ICD-10-CM | POA: Insufficient documentation

## 2014-05-26 DIAGNOSIS — S0003XA Contusion of scalp, initial encounter: Secondary | ICD-10-CM | POA: Insufficient documentation

## 2014-05-26 DIAGNOSIS — Z9181 History of falling: Secondary | ICD-10-CM | POA: Insufficient documentation

## 2014-05-26 DIAGNOSIS — F319 Bipolar disorder, unspecified: Secondary | ICD-10-CM | POA: Insufficient documentation

## 2014-05-26 DIAGNOSIS — IMO0002 Reserved for concepts with insufficient information to code with codable children: Secondary | ICD-10-CM | POA: Insufficient documentation

## 2014-05-26 DIAGNOSIS — F411 Generalized anxiety disorder: Secondary | ICD-10-CM | POA: Insufficient documentation

## 2014-05-26 DIAGNOSIS — Z791 Long term (current) use of non-steroidal anti-inflammatories (NSAID): Secondary | ICD-10-CM | POA: Insufficient documentation

## 2014-05-26 DIAGNOSIS — Y9389 Activity, other specified: Secondary | ICD-10-CM | POA: Insufficient documentation

## 2014-05-26 DIAGNOSIS — S0083XA Contusion of other part of head, initial encounter: Secondary | ICD-10-CM | POA: Insufficient documentation

## 2014-05-26 DIAGNOSIS — Z8679 Personal history of other diseases of the circulatory system: Secondary | ICD-10-CM | POA: Insufficient documentation

## 2014-05-26 DIAGNOSIS — J9859 Other diseases of mediastinum, not elsewhere classified: Secondary | ICD-10-CM

## 2014-05-26 DIAGNOSIS — Z8719 Personal history of other diseases of the digestive system: Secondary | ICD-10-CM | POA: Insufficient documentation

## 2014-05-26 DIAGNOSIS — Z8742 Personal history of other diseases of the female genital tract: Secondary | ICD-10-CM | POA: Insufficient documentation

## 2014-05-26 DIAGNOSIS — R222 Localized swelling, mass and lump, trunk: Secondary | ICD-10-CM | POA: Insufficient documentation

## 2014-05-26 DIAGNOSIS — Y9289 Other specified places as the place of occurrence of the external cause: Secondary | ICD-10-CM | POA: Insufficient documentation

## 2014-05-26 NOTE — ED Notes (Signed)
Patient states that fell against a car, hit the side of her face on left on a side mirror.  Patient also with laceration to left forearm.  Patient is sleepy in triage.  Patient has some pain meds on board at this time.  She states that they are ibuprofen and vicodin.

## 2014-05-27 ENCOUNTER — Emergency Department (HOSPITAL_COMMUNITY): Payer: Medicare Other

## 2014-05-27 LAB — CBC WITH DIFFERENTIAL/PLATELET
Basophils Absolute: 0 10*3/uL (ref 0.0–0.1)
Basophils Relative: 1 % (ref 0–1)
EOS PCT: 4 % (ref 0–5)
Eosinophils Absolute: 0.2 10*3/uL (ref 0.0–0.7)
HEMATOCRIT: 40.7 % (ref 36.0–46.0)
HEMOGLOBIN: 13.1 g/dL (ref 12.0–15.0)
LYMPHS ABS: 0.7 10*3/uL (ref 0.7–4.0)
LYMPHS PCT: 17 % (ref 12–46)
MCH: 32.6 pg (ref 26.0–34.0)
MCHC: 32.2 g/dL (ref 30.0–36.0)
MCV: 101.2 fL — ABNORMAL HIGH (ref 78.0–100.0)
MONO ABS: 0.4 10*3/uL (ref 0.1–1.0)
MONOS PCT: 11 % (ref 3–12)
NEUTROS ABS: 2.7 10*3/uL (ref 1.7–7.7)
Neutrophils Relative %: 67 % (ref 43–77)
Platelets: 168 10*3/uL (ref 150–400)
RBC: 4.02 MIL/uL (ref 3.87–5.11)
RDW: 12.6 % (ref 11.5–15.5)
WBC: 4 10*3/uL (ref 4.0–10.5)

## 2014-05-27 LAB — COMPREHENSIVE METABOLIC PANEL
ALT: 71 U/L — ABNORMAL HIGH (ref 0–35)
AST: 108 U/L — ABNORMAL HIGH (ref 0–37)
Albumin: 3.8 g/dL (ref 3.5–5.2)
Alkaline Phosphatase: 141 U/L — ABNORMAL HIGH (ref 39–117)
BILIRUBIN TOTAL: 1.9 mg/dL — AB (ref 0.3–1.2)
BUN: 17 mg/dL (ref 6–23)
CALCIUM: 10.5 mg/dL (ref 8.4–10.5)
CHLORIDE: 96 meq/L (ref 96–112)
CO2: 31 meq/L (ref 19–32)
CREATININE: 1.13 mg/dL — AB (ref 0.50–1.10)
GFR calc non Af Amer: 56 mL/min — ABNORMAL LOW (ref >90)
GFR, EST AFRICAN AMERICAN: 65 mL/min — AB (ref >90)
GLUCOSE: 103 mg/dL — AB (ref 70–99)
Potassium: 4.4 mEq/L (ref 3.7–5.3)
Sodium: 137 mEq/L (ref 137–147)
Total Protein: 7.8 g/dL (ref 6.0–8.3)

## 2014-05-27 LAB — ACETAMINOPHEN LEVEL

## 2014-05-27 LAB — RAPID URINE DRUG SCREEN, HOSP PERFORMED
Amphetamines: NOT DETECTED
Barbiturates: NOT DETECTED
Benzodiazepines: POSITIVE — AB
Cocaine: NOT DETECTED
Opiates: NOT DETECTED
Tetrahydrocannabinol: POSITIVE — AB

## 2014-05-27 LAB — APTT: APTT: 38 s — AB (ref 24–37)

## 2014-05-27 LAB — PROTIME-INR
INR: 1.06 (ref 0.00–1.49)
Prothrombin Time: 13.6 seconds (ref 11.6–15.2)

## 2014-05-27 LAB — SALICYLATE LEVEL

## 2014-05-27 LAB — ETHANOL: Alcohol, Ethyl (B): <11 mg/dL (ref 0–11)

## 2014-05-27 MED ORDER — NAPROXEN 500 MG PO TABS: 500.0000 mg | ORAL_TABLET | Freq: Two times a day (BID) | ORAL | Status: DC

## 2014-05-27 NOTE — ED Provider Notes (Signed)
CSN: [id number]     Arrival date & time 05/26/14  2328 History   First MD Initiated Contact with Patient 05/26/14 2348     Chief Complaint  Patient presents with  . Fall     (Consider location/radiation/quality/duration/timing/severity/associated sxs/prior Treatment) HPI Comments: 48 year old female with a history of polysubstance abuse, hypothyroidism, bipolar disorder, liver failure secondary to cirrhosis and Wegener's granulomatosis. She has a surgical history of multiple orthopedic fractures requiring implantable hardware. She presents this evening from home after she had several falls. She states that she has been packing to try to move as she feels she needs increased level of care to prevent her from becoming an alcoholic again. She had just undergone detox in a local detox facility for 2 weeks and states that she has been clean since that time. While she was playing with some children by the parked vehicles, she states she lost her balance and fell into the car striking the left side of her head and face, denies loss of consciousness, states she had several other falls as well. She also endorses having multiple minor injuries while packing stating that she bumped into things and bruises easily. On arrival the patient is altered, confused, slurring her speech. She is unable to tell me why she is like this, she does endorse taking pain medication but refuses to tell me what it was or when she took it. She adamantly denies alcohol use. Her only complaint is of some low back pain and jaw pain on the left where she struck her head on the side mirror of the car.  Level 5 caveat applies secondary to Altered MS   Patient is a 48 y.o. female presenting with fall. The history is provided by the patient and medical records.  Fall    Past Medical History  Diagnosis Date  . Hypothyroidism   . Blood transfusion July 2012  . Depression   . Anxiety   . Bipolar 1 disorder   . Menorrhagia   . Liver  failure   . Bronchitis   . Cirrhosis of liver   . Wegener's granulomatosis   . History of sarcoidosis    Past Surgical History  Procedure Laterality Date  . Tubal ligation    . Laparoscopy    . Orthopedic surgery    . Cholecystectomy    . Fracture surgery    . Hernia repair    . Vaginal hysterectomy  07/27/2011    Procedure: HYSTERECTOMY VAGINAL;  Surgeon: [name], MD;  Location: [facility];  Service: Gynecology;  Laterality: N/A;  Vaginal Hysterectomy with Right Oophorectomy  . Laparotomy  07/28/2011    Procedure: EXPLORATORY LAPAROTOMY;  Surgeon: [name], MD;  Location: [facility];  Service: Gynecology;  Laterality: N/A;   Family History  Problem Relation Age of Onset  . Diabetes Mother   . Hypertension Mother   . Diabetes Daughter   . Hypertension Daughter    History  Substance Use Topics  . Smoking status: Current Some Day Smoker -- 0.25 packs/day  . Smokeless tobacco: Never Used  . Alcohol Use: Yes     Comment: a fifth of vodka and a 12 pk a day of beer    OB History   Grav Para Term Preterm Abortions TAB SAB Ect Mult Living   4 3 3  0 1     3     Review of Systems  Unable to perform ROS: Mental status change      Allergies  Fluoxetine  hcl; Metoclopramide hcl; and Morphine and related  Home Medications   Prior to Admission medications   Medication Sig Start Date End Date Taking? Authorizing Provider  albuterol (PROVENTIL HFA;VENTOLIN HFA) 108 (90 BASE) MCG/ACT inhaler Inhale 1-2 puffs into the lungs every 6 (six) hours as needed for wheezing. 05/16/14 05/16/15 Yes Encarnacion Slates, NP  albuterol (PROVENTIL) (2.5 MG/3ML) 0.083% nebulizer solution Take 3 mLs (2.5 mg total) by nebulization every 6 (six) hours as needed for wheezing or shortness of breath. 05/16/14  Yes Encarnacion Slates, NP  docusate sodium (COLACE) 100 MG capsule Take 1 capsule (100 mg total) by mouth 2 (two) times daily. (This is an over the counter medicine): For constipation 05/16/14  Yes Encarnacion Slates, NP  DULoxetine (CYMBALTA) 30 MG capsule Take 1 capsule (30 mg total) by mouth daily. For depression 05/16/14  Yes Encarnacion Slates, NP  gabapentin (NEURONTIN) 300 MG capsule Take 1 capsule (300 mg total) by mouth 3 (three) times daily. For substance withdrawal syndrome 05/16/14  Yes Encarnacion Slates, NP  ibuprofen (ADVIL,MOTRIN) 800 MG tablet Take 800 mg by mouth every 8 (eight) hours as needed for moderate pain.   Yes Historical Provider, MD  lithium carbonate 300 MG capsule Take 1 capsule (300 mg total) by mouth 2 (two) times daily with a meal. For mood stabilization 05/16/14  Yes Encarnacion Slates, NP  loratadine (CLARITIN) 10 MG tablet Take 1 tablet (10 mg total) by mouth daily. (This is an over the counter medicine): For allergies 05/16/14  Yes Encarnacion Slates, NP  OLANZapine (ZYPREXA) 5 MG tablet Take 1 tablet (5 mg total) by mouth 2 (two) times daily. For mood control 05/16/14  Yes Encarnacion Slates, NP  oxyCODONE (OXY IR/ROXICODONE) 5 MG immediate release tablet Take 5 mg by mouth every 4 (four) hours as needed for severe pain.   Yes Historical Provider, MD  polyethylene glycol (MIRALAX / GLYCOLAX) packet Take 17 g by mouth daily. For constipation 05/16/14  Yes Encarnacion Slates, NP  propranolol (INDERAL) 20 MG tablet Take 1 tablet (20 mg total) by mouth 2 (two) times daily. For anxiety 05/16/14  Yes Encarnacion Slates, NP  thyroid (ARMOUR) 30 MG tablet Take 1 tablet (30 mg total) by mouth daily before breakfast. For low thyroid function 05/16/14  Yes Encarnacion Slates, NP  traZODone (DESYREL) 100 MG tablet Take 1 tablet (100 mg total) by mouth at bedtime and may repeat dose one time if needed. For sleep 05/16/14  Yes Encarnacion Slates, NP  naproxen (NAPROSYN) 500 MG tablet Take 1 tablet (500 mg total) by mouth 2 (two) times daily with a meal. 05/27/14   Johnna Acosta, MD   BP 107/61  Pulse 65  Temp(Src) 97.9 F (36.6 C) (Oral)  Resp 20  SpO2 94% Physical Exam  Nursing note and vitals reviewed. Constitutional: She appears  well-developed and well-nourished.  Somnolent  HENT:  Head: Normocephalic and atraumatic.  Mouth/Throat: Oropharynx is clear and moist. No oropharyngeal exudate.  Mucous membranes dry, tenderness over the left mandible at the angle, the patient is unable to open her jaw, unable to tell me if she has malocclusion. No obvious blood in her mouth, naris or behind the tympanic membranes or external auditory canals.  Eyes: Conjunctivae and EOM are normal. Pupils are equal, round, and reactive to light. Right eye exhibits no discharge. Left eye exhibits no discharge. No scleral icterus.  Neck: Normal range of motion. Neck supple. No  JVD present. No thyromegaly present.  Cardiovascular: Normal rate, regular rhythm, normal heart sounds and intact distal pulses.  Exam reveals no gallop and no friction rub.   No murmur heard. Pulmonary/Chest: Effort normal and breath sounds normal. No respiratory distress. She has no wheezes. She has no rales.  Abdominal: Soft. Bowel sounds are normal. She exhibits no distension and no mass. There is no tenderness.  Musculoskeletal: Normal range of motion. She exhibits tenderness ( Multiple bruises up and down the right leg including a large fresh bruise on her right side laterally. Normal range of motion of hip knee and ankles bilaterally). She exhibits no edema.  Lymphadenopathy:    She has no cervical adenopathy.  Neurological: She is alert. Coordination normal.  Somnolence, barely arousable to voice, follows commands albeit slowly, cranial nerves III through XII appear intact, no focal weakness or numbness of all 4 extremities, able to straight leg raise bilaterally, no pronator drift, speech is slurred  Skin: Skin is warm and dry.  Bruising as described above of the right lower extremity, skin abrasion to the left forearm on the extensor surface distally. No laceration present  Psychiatric: She has a normal mood and affect. Her behavior is normal.    ED Course   Procedures (including critical care time) Labs Review Labs Reviewed  SALICYLATE LEVEL - Abnormal; Notable for the following:    Salicylate Lvl 123456 (*)    All other components within normal limits  URINE RAPID DRUG SCREEN (HOSP PERFORMED) - Abnormal; Notable for the following:    Benzodiazepines POSITIVE (*)    Tetrahydrocannabinol POSITIVE (*)    All other components within normal limits  CBC WITH DIFFERENTIAL - Abnormal; Notable for the following:    MCV 101.2 (*)    All other components within normal limits  COMPREHENSIVE METABOLIC PANEL - Abnormal; Notable for the following:    Glucose, Bld 103 (*)    Creatinine, Ser 1.13 (*)    AST 108 (*)    ALT 71 (*)    Alkaline Phosphatase 141 (*)    Total Bilirubin 1.9 (*)    GFR calc non Af Amer 56 (*)    GFR calc Af Amer 65 (*)    All other components within normal limits  APTT - Abnormal; Notable for the following:    aPTT 38 (*)    All other components within normal limits  ACETAMINOPHEN LEVEL  ETHANOL  PROTIME-INR    Imaging Review Ct Head Wo Contrast  05/27/2014   CLINICAL DATA:  Patient fell hitting the left side of the face. Altered mental status.  EXAM: CT HEAD WITHOUT CONTRAST  CT MAXILLOFACIAL WITHOUT CONTRAST  CT CERVICAL SPINE WITHOUT CONTRAST  TECHNIQUE: Multidetector CT imaging of the head, cervical spine, and maxillofacial structures were performed using the standard protocol without intravenous contrast. Multiplanar CT image reconstructions of the cervical spine and maxillofacial structures were also generated.  COMPARISON:  None.  FINDINGS: CT HEAD FINDINGS  Ventricles are normal in configuration. There is ventricular and sulcal enlargement reflecting mild atrophy, advanced for a patient of this age. There are no parenchymal masses or mass effect. Areas of white matter hypoattenuation are noted consistent with mild chronic microvascular ischemic change. There is no evidence of an infarct.  There are no extra-axial masses  or abnormal fluid collections.  No intracranial hemorrhage.  No skull fracture.  CT MAXILLOFACIAL FINDINGS  No acute fracture. There are old medial orbital wall fractures, more prominent on the right.  Mild right  maxillary sinus mucosal thickening. The sinuses are otherwise clear. Clear mastoid air cells and middle ear cavities.  There is left cheek soft tissue swelling.  Normal globes and orbits.  No soft tissue masses.  No adenopathy.  CT CERVICAL SPINE FINDINGS  Study is somewhat degraded by motion.  There is no convincing fracture. There is a grade 1 anterolisthesis of C3 and C4. There is irregular facet degenerative change on the left at C2-C3 and C3-C4 with mild degenerative change of the right C2-C3 facet. There is loss of disc height at C3-C4 through C7-T1.  There is a questionable oval soft tissue mass measuring 2 cm x 1.5 cm in the right upper mediastinum adjacent to the upper thoracic trachea. Reflect adenopathy. It is in close proximity to the inferior aspect of the thyroid gland and could potentially be thyroid in origin.  IMPRESSION: HEAD CT: No acute intracranial abnormality. Mild atrophy, advanced for age. Mild chronic microvascular ischemic change.  MAXILLOFACIAL CT: No acute fracture. Old medial orbital wall fractures. Left cheek/parotid region soft tissue contusion.  CERVICAL CT:  No fracture or acute finding.   Electronically Signed   By: Lajean Manes M.D.   On: 05/27/2014 01:30   Ct Cervical Spine Wo Contrast  05/27/2014   CLINICAL DATA:  Patient fell hitting the left side of the face. Altered mental status.  EXAM: CT HEAD WITHOUT CONTRAST  CT MAXILLOFACIAL WITHOUT CONTRAST  CT CERVICAL SPINE WITHOUT CONTRAST  TECHNIQUE: Multidetector CT imaging of the head, cervical spine, and maxillofacial structures were performed using the standard protocol without intravenous contrast. Multiplanar CT image reconstructions of the cervical spine and maxillofacial structures were also generated.   COMPARISON:  None.  FINDINGS: CT HEAD FINDINGS  Ventricles are normal in configuration. There is ventricular and sulcal enlargement reflecting mild atrophy, advanced for a patient of this age. There are no parenchymal masses or mass effect. Areas of white matter hypoattenuation are noted consistent with mild chronic microvascular ischemic change. There is no evidence of an infarct.  There are no extra-axial masses or abnormal fluid collections.  No intracranial hemorrhage.  No skull fracture.  CT MAXILLOFACIAL FINDINGS  No acute fracture. There are old medial orbital wall fractures, more prominent on the right.  Mild right maxillary sinus mucosal thickening. The sinuses are otherwise clear. Clear mastoid air cells and middle ear cavities.  There is left cheek soft tissue swelling.  Normal globes and orbits.  No soft tissue masses.  No adenopathy.  CT CERVICAL SPINE FINDINGS  Study is somewhat degraded by motion.  There is no convincing fracture. There is a grade 1 anterolisthesis of C3 and C4. There is irregular facet degenerative change on the left at C2-C3 and C3-C4 with mild degenerative change of the right C2-C3 facet. There is loss of disc height at C3-C4 through C7-T1.  There is a questionable oval soft tissue mass measuring 2 cm x 1.5 cm in the right upper mediastinum adjacent to the upper thoracic trachea. Reflect adenopathy. It is in close proximity to the inferior aspect of the thyroid gland and could potentially be thyroid in origin.  IMPRESSION: HEAD CT: No acute intracranial abnormality. Mild atrophy, advanced for age. Mild chronic microvascular ischemic change.  MAXILLOFACIAL CT: No acute fracture. Old medial orbital wall fractures. Left cheek/parotid region soft tissue contusion.  CERVICAL CT:  No fracture or acute finding.   Electronically Signed   By: Lajean Manes M.D.   On: 05/27/2014 01:30   Ct Maxillofacial Wo Cm  05/27/2014   CLINICAL DATA:  Patient fell hitting the left side of the face.  Altered mental status.  EXAM: CT HEAD WITHOUT CONTRAST  CT MAXILLOFACIAL WITHOUT CONTRAST  CT CERVICAL SPINE WITHOUT CONTRAST  TECHNIQUE: Multidetector CT imaging of the head, cervical spine, and maxillofacial structures were performed using the standard protocol without intravenous contrast. Multiplanar CT image reconstructions of the cervical spine and maxillofacial structures were also generated.  COMPARISON:  None.  FINDINGS: CT HEAD FINDINGS  Ventricles are normal in configuration. There is ventricular and sulcal enlargement reflecting mild atrophy, advanced for a patient of this age. There are no parenchymal masses or mass effect. Areas of white matter hypoattenuation are noted consistent with mild chronic microvascular ischemic change. There is no evidence of an infarct.  There are no extra-axial masses or abnormal fluid collections.  No intracranial hemorrhage.  No skull fracture.  CT MAXILLOFACIAL FINDINGS  No acute fracture. There are old medial orbital wall fractures, more prominent on the right.  Mild right maxillary sinus mucosal thickening. The sinuses are otherwise clear. Clear mastoid air cells and middle ear cavities.  There is left cheek soft tissue swelling.  Normal globes and orbits.  No soft tissue masses.  No adenopathy.  CT CERVICAL SPINE FINDINGS  Study is somewhat degraded by motion.  There is no convincing fracture. There is a grade 1 anterolisthesis of C3 and C4. There is irregular facet degenerative change on the left at C2-C3 and C3-C4 with mild degenerative change of the right C2-C3 facet. There is loss of disc height at C3-C4 through C7-T1.  There is a questionable oval soft tissue mass measuring 2 cm x 1.5 cm in the right upper mediastinum adjacent to the upper thoracic trachea. Reflect adenopathy. It is in close proximity to the inferior aspect of the thyroid gland and could potentially be thyroid in origin.  IMPRESSION: HEAD CT: No acute intracranial abnormality. Mild atrophy,  advanced for age. Mild chronic microvascular ischemic change.  MAXILLOFACIAL CT: No acute fracture. Old medial orbital wall fractures. Left cheek/parotid region soft tissue contusion.  CERVICAL CT:  No fracture or acute finding.   Electronically Signed   By: Lajean Manes M.D.   On: 05/27/2014 01:30    ED ECG REPORT  I personally interpreted this EKG   Date: 05/27/2014   Rate: 65  Rhythm: normal sinus rhythm  QRS Axis: normal  Intervals: normal  ST/T Wave abnormalities: normal  Conduction Disutrbances:none  Narrative Interpretation:   Old EKG Reviewed: none available   MDM   Final diagnoses:  Contusion of face  Contusion of thigh  Mediastinal mass    The patient is altered mental status, this could be from polypharmacy, drug use, alcohol, head injury, metabolic abnormalities and so forth. Given her history but the main concern is trauma or substance use, EKG shows no signs of acute abnormalities, we'll check Tylenol level, drug screen, CT scans, cardiac monitoring. Of note her oxygen level is approximately 89% and will be given supplemental oxygen while she is further evaluated.  Laboratory workup shows urine drug screen positive for benzodiazepines and marijuana, no Tylenol, no salicylates, no alcohol, normal blood counts, predictably she has a transaminitis likely related to her chronic alcohol use, kidneys in normal range, electrolytes are normal , CT scan of the head shows no signs of intracranial injury or fracture and the maxillofacial CT and cervical spine x-rays show no signs of fracture. She does have a questionable 2 cm upper mediastinal mass, thought to be  adenopathy but the patient will need followup. She has been informed of these results. Her boyfriend is here with her and they have both expressed her understanding.  The patient is ambulated around the hospital emergency department twice without any difficulty with oxygen levels around 93%. She has no dyspnea, no shortness of  breath, no cough, no fever. She is stable for discharge in the care of her boyfriend. He states that he has been with her since discharge from inpatient substance abuse counseling and her mental status that we are seeing at the time of discharge (which has improved significantly to the point where she is awake, alert and talking) is at her baseline. She'll be discharged home.   Meds given in ED:  Medications - No data to display  New Prescriptions   NAPROXEN (NAPROSYN) 500 MG TABLET    Take 1 tablet (500 mg total) by mouth 2 (two) times daily with a meal.      Johnna Acosta, MD 05/27/14 0345

## 2014-05-27 NOTE — ED Notes (Signed)
1L of NS started.  

## 2014-05-27 NOTE — Discharge Instructions (Signed)
If you don't have a doctor - see below  You have a small 2cm mass in your upper chest - this may be a lymph node but you MUST have it rechecked at your doctor's office in one week.    Emergency Department Resource Guide 1) Find a Doctor and Pay Out of Pocket Although you won't have to find out who is covered by your insurance plan, it is a good idea to ask around and get recommendations. You will then need to call the office and see if the doctor you have chosen will accept you as a new patient and what types of options they offer for patients who are self-pay. Some doctors offer discounts or will set up payment plans for their patients who do not have insurance, but you will need to ask so you aren't surprised when you get to your appointment.  2) Contact Your Local Health Department Not all health departments have doctors that can see patients for sick visits, but many do, so it is worth a call to see if yours does. If you don't know where your local health department is, you can check in your phone book. The CDC also has a tool to help you locate your state's health department, and many state websites also have listings of all of their local health departments.  3) Find a [facility] If your illness is not likely to be very severe or complicated, you may want to try a walk in clinic. These are popping up all over the country in pharmacies, drugstores, and shopping centers. They're usually staffed by nurse practitioners or physician assistants that have been trained to treat common illnesses and complaints. They're usually fairly quick and inexpensive. However, if you have serious medical issues or chronic medical problems, these are probably not your best option.  No Primary Care Doctor: - Call Health Connect at  [phone] - they can help you locate a primary care doctor that  accepts your insurance, provides certain services, etc. - Physician Referral Service- [phone]  Chronic Pain  Problems: Organization         Address  Phone   Notes  [facility]  [phone] Patients need to be referred by their primary care doctor.   Medication Assistance: Organization         Address  Phone   Notes  [facility] Medication [facility] [address] [place], [place] [address] [phone] --Must be a resident of [facility] -- Must have NO insurance coverage whatsoever (no Medicaid/ Medicare, etc.) -- The pt. MUST have a primary care doctor that directs their care regularly and follows them in the community   [facility]ist  [phone]   [employer]  [phone]    Agencies that provide inexpensive medical care: Organization         Address  Phone   Notes  [place]  [phone]   [facility]    [phone]   [facility] [place], [place] [address] [phone]   [place] [address], [place] [phone]   Planned Parenthood    [phone]   [facility]    [phone]   [place] and [address], [place] Phone:  [phone], Fax:  [phone] Hours of Operation:  9 am - 6 pm, M-F.  Also accepts Medicaid/Medicare  and self-pay.  [facility] for [place] [address], [address], [place] Phone: [phone], Fax: [phone]. Hours of Operation:  8:30 am - 5:30 pm, M-F.  Also accepts Medicaid and self-pay.  [facility] [place] [address] [place] Phone: [phone]   [place], [place], [place] [phone], Ext. [phone] Mondays & Thursdays: 7-9 AM.  First 15 patients are seen on a first come, first serve basis.    [place] Providers:  Organization         Address  Phone   Notes  [facility] [address], [place] [phone] Also  accepts self-pay patients.  [facility] [address], [place]  [phone]   [place], [address], [place] [phone]   [facility] [address] [place] [phone]   Lucianne Lei [address], [place]   [phone] Only accepts [place] Access [place] patients after they have their name applied to their card.   Self-Pay (no insurance) in [facility]:  Organization         Address  Phone   Notes  Sickle Cell Patients, [facility] [place] [phone]   [facility] Urgent Care [place] [phone]   [facility] [place]  [place], [place], [place] [phone]   [facility]/Dr. Osei-Bonsu  [address] [place] or [address], [place] [phone] Phone number for both [place] and [place] locations is the same.  Urgent Medical and [facility] [address], [place] [phone]   [facility] [address], [place] or [address] [phone]   [facility] [address], [place] [phone], phone; [phone], fax Sees patients 1st and 3rd Saturday of every month.  Must not qualify for public or private insurance (i.e. Medicaid, Medicare, [place] Health Choice, Veterans' Benefits)  Household income should be no more than 200% of the poverty level The clinic cannot treat you if you are pregnant or think you are pregnant  Sexually transmitted diseases are not treated at the clinic.    Dental Care: Organization         Address  Phone  Notes  [facility] Department of [facility] [phone] Accepts children up to age 41 who are enrolled in [place] or [place]; pregnant  women with a Medicaid card; and children who have applied for Medicaid or [place] Health Choice, but were declined, whose parents can pay a reduced fee at time of service.  [facility] Department of [facility]  [address], [place] [phone] Accepts children up to age 21 who are enrolled in [place] or [place]; pregnant women with a Medicaid card; and children who have applied for Medicaid or [place] Health Choice, but were declined, whose parents can pay a reduced fee at time of service.  [place] Adult Dental Access PROGRAM  [place] [phone] Patients are seen by appointment only. Walk-ins are not accepted. [place] will see patients 51 years of age and older. Monday - Tuesday (8am-5pm) Most Wednesdays (  8:30-5pm) $30 per visit, cash only  [facility] Adult Dental Access PROGRAM  [address], [facility] [phone] Patients are seen by appointment only. Walk-ins are not accepted. [place] will see patients 55 years of age and older. One Wednesday Evening (Monthly: Volunteer Based).  $30 per visit, cash only  [place]  [phone] for adults; Children under age 64, call Graduate Pediatric Dentistry at [phone]. Children aged 78-14, please call [phone] to request a pediatric application.  Dental services are provided in all areas of dental care including fillings, crowns and bridges, complete and partial dentures, implants, gum treatment, root canals, and extractions. Preventive care is also provided. Treatment is provided to both adults and children. Patients are selected via a lottery and there is often a waiting list.   [facility] [address] [place]  [phone] [email or web address]   [facility] [address] [place], [place] [phone], Ext. [phone] Second and Fourth Thursday of each month, opens at 6:30 AM; Clinic ends at 9 AM.  Patients are  seen on a first-come first-served basis, and a limited number are seen during each clinic.   [facility]  [address] Hillard Danker [place], [place] [phone]   Eligibility Requirements You must have lived in [place], [place], or [place] counties for at least the last three months.   You cannot be eligible for state or federal sponsored [employer], including [employer], [place], or [employer].   You generally cannot be eligible for healthcare insurance through your employer.    How to apply: Eligibility screenings are held every Tuesday and Wednesday afternoon from 1:00 pm until 4:00 pm. You do not need an appointment for the interview!  [facility] [address], [place], [place]   [place]  [place] Department  [place]  [phone]    Behavioral Health Resources in the Community: Intensive Outpatient Programs Organization         Address  Phone  Notes  [place]. [address] [place], [place] [phone]   [facility]patient [address] [place], [place]   ADS: Alcohol & Drug Svcs [address], [place], [place]   [place] [address],  [place], [place] or [phone]   Substance Abuse Resources Organization         Address  Phone  Notes  Alcohol and Drug Services  [phone]   [place]  [phone]   [facility] [place]   Chinita Pester  [phone]   Residential & Outpatient Substance Abuse Program  [phone]   Psychological Services Organization         Address  Phone  Notes  [facility] [place]  [place]  [phone]   [place] [address] [place] or [phone]    Mobile Crisis  Teams Organization         Address  Phone  Notes  [facility], Mobile Crisis Care Unit  [phone]   [facility] Psychotherapeutic Services  [address] [place], [place]   [place] [address], [place] [phone]    Self-Help/Support Groups Organization         Address  Phone  Notes  Mental Health Assoc. of [place] - variety of support groups  [place] Call for more information  Narcotics Anonymous (NA), [facility] [address], [employer] [place]  2 meetings at this location   Special educational needs teacher         Address  Phone  Notes  [facility] Residential Treatment [place],    [place]  [phone]   [facility]  [address], [place] [address]2446, [place], [place]   [place], [place] [phone] Admissions: 8am-3pm M-F  Incentives Substance [place] [address],    [place], [place] [phone]   [facility] [address] [place], [place], [place]   [facility] [address],  [place], [place]   [facility]ight Programs - Intensive Outpatient [address]een [address], [place], [place]   [facility] ([place].,  [place], [place] [phone] or [phone]   [facility]idential Treatment Services (RTS) [address], [place], [place] Accepts Medicaid  [place] [address]  [place] [phone] Substance Abuse/Addiction Treatment   [facility] Organization         Address  Phone  Notes  [facility]  [phone]   Domenic Schwab, PhD [address] Arlis Porta [place], [place]   [phone] or [phone]   [place], [place] [phone]   [facility] [address] [place], [place] [phone]  Insurance/Medicaid/sponsorship through [facility] and Families [address], [address] [place]                                    [place], [place] [phone] [place] [address], [place] [phone]    Dr. Adele Schilder  [phone]   [facility] Dept. 1) [address], [place] 2) [place] 3)  [place] [address], [place] [phone]  [phone]   [place] [phone] or [phone] (After Hours)

## 2014-05-27 NOTE — ED Notes (Signed)
Pt ambulated to restroom with assistance.  Pt maintained O2 sats between 91-93%.

## 2014-07-28 ENCOUNTER — Encounter (HOSPITAL_COMMUNITY): Payer: Self-pay | Admitting: Emergency Medicine

## 2014-07-28 ENCOUNTER — Emergency Department (HOSPITAL_COMMUNITY)
Admission: EM | Admit: 2014-07-28 | Discharge: 2014-07-29 | Disposition: A | Discharge status: Home / Self Care | Payer: Medicare Other | Attending: Emergency Medicine | Admitting: Emergency Medicine

## 2014-07-28 DIAGNOSIS — F411 Generalized anxiety disorder: Secondary | ICD-10-CM | POA: Insufficient documentation

## 2014-07-28 DIAGNOSIS — Z862 Personal history of diseases of the blood and blood-forming organs and certain disorders involving the immune mechanism: Secondary | ICD-10-CM | POA: Diagnosis not present

## 2014-07-28 DIAGNOSIS — Z8639 Personal history of other endocrine, nutritional and metabolic disease: Secondary | ICD-10-CM | POA: Insufficient documentation

## 2014-07-28 DIAGNOSIS — R1084 Generalized abdominal pain: Secondary | ICD-10-CM

## 2014-07-28 DIAGNOSIS — Z8709 Personal history of other diseases of the respiratory system: Secondary | ICD-10-CM | POA: Insufficient documentation

## 2014-07-28 DIAGNOSIS — Z8742 Personal history of other diseases of the female genital tract: Secondary | ICD-10-CM | POA: Insufficient documentation

## 2014-07-28 DIAGNOSIS — F319 Bipolar disorder, unspecified: Secondary | ICD-10-CM | POA: Diagnosis not present

## 2014-07-28 DIAGNOSIS — R112 Nausea with vomiting, unspecified: Secondary | ICD-10-CM | POA: Diagnosis not present

## 2014-07-28 DIAGNOSIS — F17200 Nicotine dependence, unspecified, uncomplicated: Secondary | ICD-10-CM | POA: Diagnosis not present

## 2014-07-28 DIAGNOSIS — Z8619 Personal history of other infectious and parasitic diseases: Secondary | ICD-10-CM | POA: Insufficient documentation

## 2014-07-28 DIAGNOSIS — Z8679 Personal history of other diseases of the circulatory system: Secondary | ICD-10-CM | POA: Diagnosis not present

## 2014-07-28 DIAGNOSIS — K5900 Constipation, unspecified: Secondary | ICD-10-CM | POA: Insufficient documentation

## 2014-07-28 DIAGNOSIS — R109 Unspecified abdominal pain: Secondary | ICD-10-CM | POA: Insufficient documentation

## 2014-07-28 DIAGNOSIS — Z79899 Other long term (current) drug therapy: Secondary | ICD-10-CM | POA: Diagnosis not present

## 2014-07-28 NOTE — ED Notes (Signed)
Pt is c/o constipation  Pt states her last good bowel movement was a week ago

## 2014-07-29 ENCOUNTER — Emergency Department (HOSPITAL_COMMUNITY): Payer: Medicare Other

## 2014-07-29 DIAGNOSIS — R1084 Generalized abdominal pain: Secondary | ICD-10-CM | POA: Diagnosis not present

## 2014-07-29 LAB — COMPREHENSIVE METABOLIC PANEL
ALBUMIN: 3.7 g/dL (ref 3.5–5.2)
ALT: 14 U/L (ref 0–35)
ANION GAP: 12 (ref 5–15)
AST: 35 U/L (ref 0–37)
Alkaline Phosphatase: 98 U/L (ref 39–117)
BUN: 12 mg/dL (ref 6–23)
CALCIUM: 9.4 mg/dL (ref 8.4–10.5)
CO2: 28 mEq/L (ref 19–32)
Chloride: 98 mEq/L (ref 96–112)
Creatinine, Ser: 0.71 mg/dL (ref 0.50–1.10)
GFR calc non Af Amer: >90 mL/min (ref >90)
GLUCOSE: 105 mg/dL — AB (ref 70–99)
POTASSIUM: 4.1 meq/L (ref 3.7–5.3)
SODIUM: 138 meq/L (ref 137–147)
TOTAL PROTEIN: 7.6 g/dL (ref 6.0–8.3)
Total Bilirubin: 2 mg/dL — ABNORMAL HIGH (ref 0.3–1.2)

## 2014-07-29 LAB — CBC WITH DIFFERENTIAL/PLATELET
BASOS ABS: 0 10*3/uL (ref 0.0–0.1)
BASOS PCT: 1 % (ref 0–1)
EOS ABS: 0 10*3/uL (ref 0.0–0.7)
Eosinophils Relative: 1 % (ref 0–5)
HCT: 40.4 % (ref 36.0–46.0)
HEMOGLOBIN: 13.9 g/dL (ref 12.0–15.0)
LYMPHS PCT: 28 % (ref 12–46)
Lymphs Abs: 0.6 10*3/uL — ABNORMAL LOW (ref 0.7–4.0)
MCH: 33.2 pg (ref 26.0–34.0)
MCHC: 34.4 g/dL (ref 30.0–36.0)
MCV: 96.4 fL (ref 78.0–100.0)
MONO ABS: 0.2 10*3/uL (ref 0.1–1.0)
Monocytes Relative: 11 % (ref 3–12)
NEUTROS ABS: 1.4 10*3/uL — AB (ref 1.7–7.7)
NEUTROS PCT: 59 % (ref 43–77)
Platelets: 62 10*3/uL — ABNORMAL LOW (ref 150–400)
RBC: 4.19 MIL/uL (ref 3.87–5.11)
RDW: 14.5 % (ref 11.5–15.5)
WBC: 2.2 10*3/uL — ABNORMAL LOW (ref 4.0–10.5)

## 2014-07-29 LAB — LIPASE, BLOOD: Lipase: 26 U/L (ref 11–59)

## 2014-07-29 MED ORDER — MILK AND MOLASSES ENEMA: 1.0000 | Freq: Once | RECTAL | Status: DC

## 2014-07-29 MED ORDER — DOCUSATE SODIUM 100 MG PO CAPS: 100.0000 mg | ORAL_CAPSULE | Freq: Two times a day (BID) | ORAL | Status: DC

## 2014-07-29 MED ORDER — MILK AND MOLASSES ENEMA
1.0000 | Freq: Once | RECTAL | Status: AC
  Administered 2014-07-29: 250 mL via RECTAL
  Filled 2014-07-29: qty 250

## 2014-07-29 MED ORDER — PROMETHAZINE HCL 25 MG PO TABS: 25.0000 mg | ORAL_TABLET | Freq: Four times a day (QID) | ORAL | Status: DC | PRN

## 2014-07-29 MED ORDER — ONDANSETRON HCL 4 MG/2ML IJ SOLN
4.0000 mg | INTRAMUSCULAR | Status: AC
  Administered 2014-07-29: 4 mg via INTRAVENOUS
  Filled 2014-07-29: qty 2

## 2014-07-29 MED ORDER — POLYETHYLENE GLYCOL 3350 17 GM/SCOOP PO POWD: 17.0000 g | Freq: Every day | ORAL | Status: DC

## 2014-07-29 NOTE — ED Notes (Signed)
Patient set up with bedside commode, tissue, and call bell in reach. Patient requested to notify staff is she needs anything else.

## 2014-07-29 NOTE — Discharge Instructions (Signed)

## 2014-07-29 NOTE — ED Notes (Signed)
Patient with small amount of stool with enema. PA notified.

## 2014-07-29 NOTE — ED Provider Notes (Signed)
CSN: [id number]     Arrival date & time 07/28/14  2110 History   First MD Initiated Contact with Patient 07/29/14 0033     Chief Complaint  Patient presents with  . Constipation     (Consider location/radiation/quality/duration/timing/severity/associated sxs/prior Treatment) HPI Comments: Patient is a 48 year old female with a history of depression, anxiety, bipolar 1 disorder, liver cirrhosis, and Wegener's granulomatosis who presents to the emergency department for abdominal pain. Patient states that her abdominal pain feels consistent with constipation. She states that it is aching, diffuse, and constant. She has not had a bowel movement in one week. Patient does endorse passing flatus yesterday morning. Symptoms have been associated with nausea and one episode of nonbloody emesis today. Patient has tried a Merchandiser, retail, MiraLax, Epsom salts, and molasses emesis as an outpatient without improvement in her symptoms. Her constipation is likely secondary to her chronic use of opiates as she takes 5 mg oxycodone every 6 hours. Patient denies associated fever, chest pain, hematemesis, dysuria, and vaginal complaints. Abdominal surgical history significant for hysterectomy, cholecystectomy, and hiatal hernia repair.  Patient is a 48 y.o. female presenting with constipation. The history is provided by the patient. No language interpreter was used.  Constipation Associated symptoms: abdominal pain, nausea and vomiting   Associated symptoms: no diarrhea, no dysuria and no fever     Past Medical History  Diagnosis Date  . Hypothyroidism   . Blood transfusion July 2012  . Depression   . Anxiety   . Bipolar 1 disorder   . Menorrhagia   . Liver failure   . Bronchitis   . Cirrhosis of liver   . Wegener's granulomatosis   . History of sarcoidosis    Past Surgical History  Procedure Laterality Date  . Tubal ligation    . Laparoscopy    . Orthopedic surgery    . Cholecystectomy    . Fracture  surgery    . Hernia repair    . Vaginal hysterectomy  07/27/2011    Procedure: HYSTERECTOMY VAGINAL;  Surgeon: [name], MD;  Location: [facility];  Service: Gynecology;  Laterality: N/A;  Vaginal Hysterectomy with Right Oophorectomy  . Laparotomy  07/28/2011    Procedure: EXPLORATORY LAPAROTOMY;  Surgeon: [name], MD;  Location: [facility];  Service: Gynecology;  Laterality: N/A;   Family History  Problem Relation Age of Onset  . Diabetes Mother   . Hypertension Mother   . Diabetes Daughter   . Hypertension Daughter    History  Substance Use Topics  . Smoking status: Current Some Day Smoker -- 0.25 packs/day  . Smokeless tobacco: Never Used  . Alcohol Use: Yes     Comment: a fifth of vodka and a 12 pk a day of beer    OB History   Grav Para Term Preterm Abortions TAB SAB Ect Mult Living   4 3 3  0 1     3      Review of Systems  Constitutional: Negative for fever.  Cardiovascular: Negative for chest pain.  Gastrointestinal: Positive for nausea, vomiting, abdominal pain, constipation and abdominal distention. Negative for diarrhea and blood in stool.  Genitourinary: Negative for dysuria, vaginal bleeding and vaginal discharge.  Neurological: Negative for weakness and numbness.  All other systems reviewed and are negative.    Allergies  Fluoxetine hcl; Metoclopramide hcl; and Morphine and related  Home Medications   Prior to Admission medications   Medication Sig Start Date End Date Taking? Authorizing Provider  albuterol (PROVENTIL  HFA;VENTOLIN HFA) 108 (90 BASE) MCG/ACT inhaler Inhale 1-2 puffs into the lungs every 6 (six) hours as needed for wheezing. 05/16/14 05/16/15 Yes Encarnacion Slates, NP  carisoprodol (SOMA) 350 MG tablet Take 350 mg by mouth 2 (two) times daily.   Yes Historical Provider, MD  clonazePAM (KLONOPIN) 0.5 MG tablet Take 0.5 mg by mouth 2 (two) times daily as needed for anxiety (anxiety).   Yes Historical Provider, MD  DULoxetine (CYMBALTA) 30 MG capsule  Take 1 capsule (30 mg total) by mouth daily. For depression 05/16/14  Yes Encarnacion Slates, NP  gabapentin (NEURONTIN) 300 MG capsule Take 1 capsule (300 mg total) by mouth 3 (three) times daily. For substance withdrawal syndrome 05/16/14  Yes Encarnacion Slates, NP  lithium carbonate 300 MG capsule Take 1 capsule (300 mg total) by mouth 2 (two) times daily with a meal. For mood stabilization 05/16/14  Yes Encarnacion Slates, NP  magnesium sulfate (EPSOM SALT) GRAN Take 10 g by mouth as needed for mild constipation (constipation).   Yes Historical Provider, MD  milk and molasses SOLN Place 1 enema rectally once.   Yes Historical Provider, MD  OLANZapine (ZYPREXA) 5 MG tablet Take 1 tablet (5 mg total) by mouth 2 (two) times daily. For mood control 05/16/14  Yes Encarnacion Slates, NP  oxyCODONE (OXY IR/ROXICODONE) 5 MG immediate release tablet Take 5 mg by mouth every 4 (four) hours as needed for severe pain.   Yes Historical Provider, MD  propranolol (INDERAL) 20 MG tablet Take 1 tablet (20 mg total) by mouth 2 (two) times daily. For anxiety 05/16/14  Yes Encarnacion Slates, NP  QUEtiapine (SEROQUEL) 400 MG tablet Take 400 mg by mouth at bedtime.   Yes Historical Provider, MD  sodium phosphate (FLEET) 7-19 GM/118ML ENEM Place 1 enema rectally as needed for severe constipation (constipation).   Yes Historical Provider, MD  thyroid (ARMOUR) 30 MG tablet Take 1 tablet (30 mg total) by mouth daily before breakfast. For low thyroid function 05/16/14  Yes Encarnacion Slates, NP  traZODone (DESYREL) 100 MG tablet Take 1 tablet (100 mg total) by mouth at bedtime and may repeat dose one time if needed. For sleep 05/16/14  Yes Encarnacion Slates, NP  zolpidem (AMBIEN) 5 MG tablet Take 5 mg by mouth at bedtime as needed for sleep (sleep).   Yes Historical Provider, MD  docusate sodium (COLACE) 100 MG capsule Take 1 capsule (100 mg total) by mouth 2 (two) times daily. (This is an over the counter medicine): For constipation 07/29/14   Antonietta Breach, PA-C   polyethylene glycol powder (GLYCOLAX/MIRALAX) powder Take 17 g by mouth daily. Until daily soft stools  OTC 07/29/14   Antonietta Breach, PA-C  promethazine (PHENERGAN) 25 MG tablet Take 1 tablet (25 mg total) by mouth every 6 (six) hours as needed for nausea or vomiting. 07/29/14   Antonietta Breach, PA-C   BP 143/84  Pulse 68  Temp(Src) 97.6 F (36.4 C) (Oral)  Resp 18  Ht 5\' 7"  (1.702 m)  Wt 253 lb (114.76 kg)  BMI 39.62 kg/m2  SpO2 98%  Physical Exam  Nursing note and vitals reviewed. Constitutional: She is oriented to person, place, and time. She appears well-developed and well-nourished. No distress.  Nontoxic/nonseptic appearing  HENT:  Head: Normocephalic and atraumatic.  Mouth/Throat: Oropharynx is clear and moist. No oropharyngeal exudate.  Eyes: Conjunctivae and EOM are normal. No scleral icterus.  Neck: Normal range of motion.  Cardiovascular: Normal rate,  regular rhythm and normal heart sounds.   Pulmonary/Chest: Effort normal. No respiratory distress. She has no wheezes. She has no rales.  No tachypnea or dyspnea. Chest expansion symmetric  Abdominal: Soft. She exhibits distension. There is tenderness. There is no rebound and no guarding.  Soft mildly distended abdomen with diffuse tenderness. No focal tenderness or peritoneal signs. No ascites. Bowel sounds hypoactive.  Musculoskeletal: Normal range of motion.  Neurological: She is alert and oriented to person, place, and time. She exhibits normal muscle tone. Coordination normal.  GCS 15. Patient moves extremities without ataxia  Skin: Skin is warm and dry. No rash noted. She is not diaphoretic. No erythema. No pallor.  Psychiatric: She has a normal mood and affect. Her behavior is normal.    ED Course  Procedures (including critical care time) Labs Review Labs Reviewed  CBC WITH DIFFERENTIAL - Abnormal; Notable for the following:    WBC 2.2 (*)    Platelets 62 (*)    Neutro Abs 1.4 (*)    Lymphs Abs 0.6 (*)    All  other components within normal limits  COMPREHENSIVE METABOLIC PANEL - Abnormal; Notable for the following:    Glucose, Bld 105 (*)    Total Bilirubin 2.0 (*)    All other components within normal limits  LIPASE, BLOOD    Imaging Review Dg Abd 2 Views  07/29/2014   CLINICAL DATA:  Constipation for 1 week.  Mid abdominal pain.  EXAM: ABDOMEN - 2 VIEW  COMPARISON:  05/11/2013  FINDINGS: No evidence of bowel obstruction or perforation. No concerning intra-abdominal mass effect or calcification. Cholecystectomy changes. Clear lung bases.  IMPRESSION: Negative.   Electronically Signed   By: Jorje Guild M.D.   On: 07/29/2014 01:27     EKG Interpretation None      MDM   Final diagnoses:  Generalized abdominal pain    48 year old female presents to the emergency department for generalized abdominal pain. Patient states the symptoms feel similar to past history of constipation. She states she has not had a bowel movement in one week, though does endorse passing flatus. Physical exam significant for mild generalized tenderness to palpation without peritoneal signs or guarding. Abdomen soft.  Labs today with leukopenia, consistent with prior workups. Patient also noted to have thrombocytopenia which is also chronic and likely secondary to patient's known cirrhosis. LFTs improved compared to prior work ups.  Acute abdominal series today shows no evidence of bowel obstruction or perforation. No free air or or evidence of stool burden. Patient treated in ED with local glass of emesis with passing of a small bowel movement. Her abdominal reexaminations have been stable. Do not believe further emergent workup is indicated at this time. Symptoms appropriate for outpatient management with MiraLax, Colace, and Phenergan. Have advised patient to follow up with her primary care provider. Return precautions discussed and patient agreeable to plan with no unaddressed concerns. Case discussed with my  attending prior to patient discharge; Dr. Venora Maples in agreement with workup, management plan, and patient's stability for discharge.   Filed Vitals:   07/28/14 2133 07/29/14 0031 07/29/14 0333  BP: 174/77 137/84 143/84  Pulse: 77 72 68  Temp: 97.6 F (36.4 C) 98.1 F (36.7 C) 97.6 F (36.4 C)  TempSrc: Oral Oral Oral  Resp: 18 18 18   Height: 5\' 7"  (1.702 m)    Weight: 253 lb (114.76 kg)    SpO2: 99% 100% 98%       Antonietta Breach, PA-C 07/29/14  0500 

## 2014-07-29 NOTE — ED Notes (Signed)
PA at bedside.

## 2014-07-29 NOTE — ED Provider Notes (Signed)
Medical screening examination/treatment/procedure(s) were performed by non-physician practitioner and as supervising physician I was immediately available for consultation/collaboration.   EKG Interpretation None        Hoy Morn, MD 07/29/14 [phone]

## 2014-10-27 ENCOUNTER — Encounter (HOSPITAL_COMMUNITY): Payer: Self-pay | Admitting: Emergency Medicine

## 2014-11-05 IMAGING — CT CT HEAD W/O CM
4 of 8 series · 16 of 47 positions shown, 18 images · non-contrast
Comparison: None.

CLINICAL DATA: Patient fell hitting the left side of the face.
Altered mental status.

EXAM:
CT HEAD WITHOUT CONTRAST
CT MAXILLOFACIAL WITHOUT CONTRAST
CT CERVICAL SPINE WITHOUT CONTRAST
TECHNIQUE: Multidetector CT imaging of the head, cervical spine, and
maxillofacial structures were performed using the standard protocol
without intravenous contrast. Multiplanar CT image reconstructions
of the cervical spine and maxillofacial structures were also
generated.

[Series 6: facial/ orbits 2.0 h30s · axial · 0.33mm/px · z∈[-222,-102]mm · 7 of 81 slices shown, 9 images]
[im 11/81  brain]
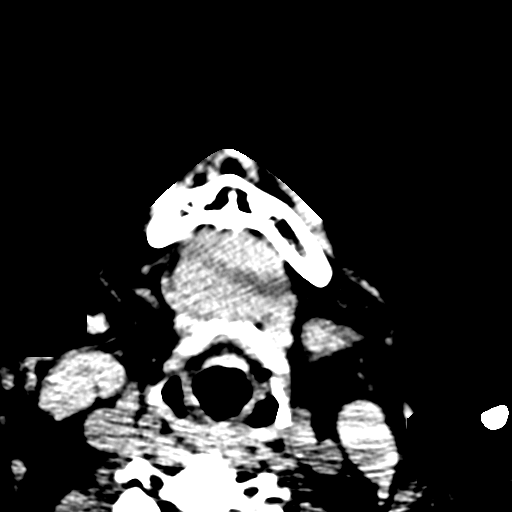
[im 11/81  bone]
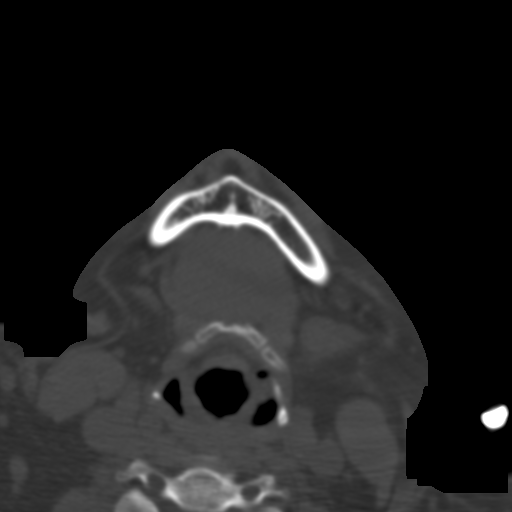
[im 21/81  brain]
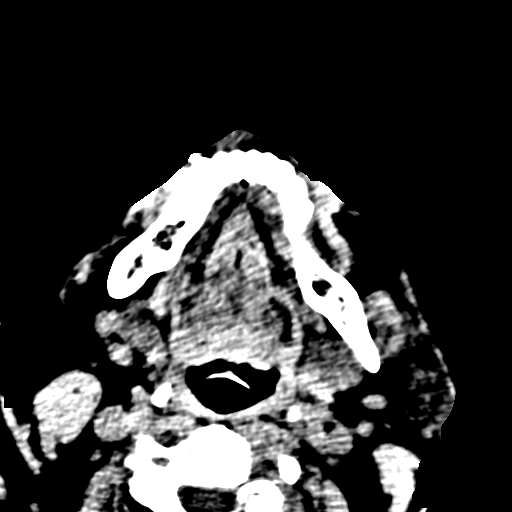
[im 31/81  brain]
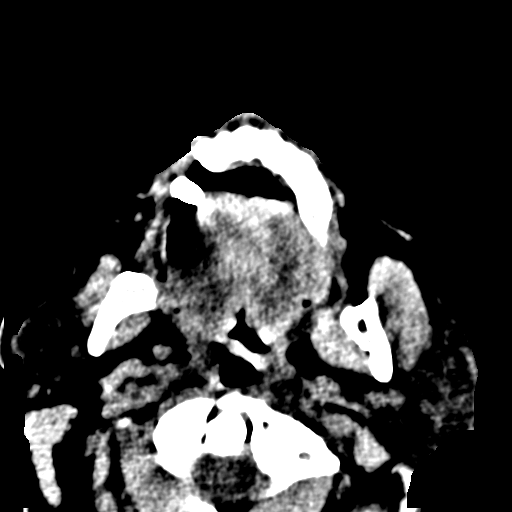
[im 41/81  brain]
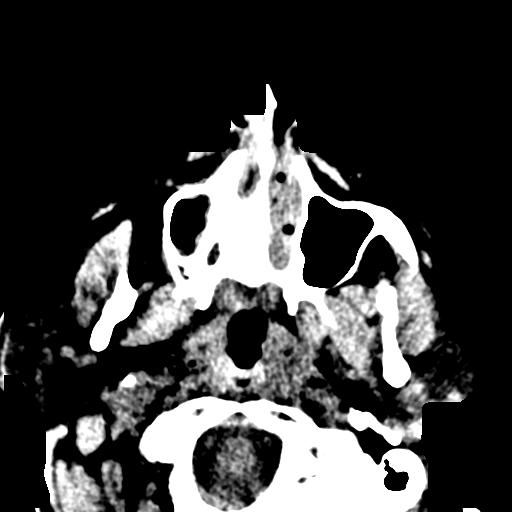
[im 51/81  brain]
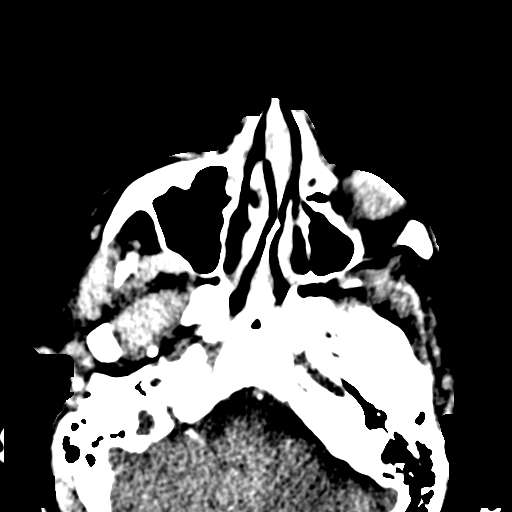
[im 51/81  bone]
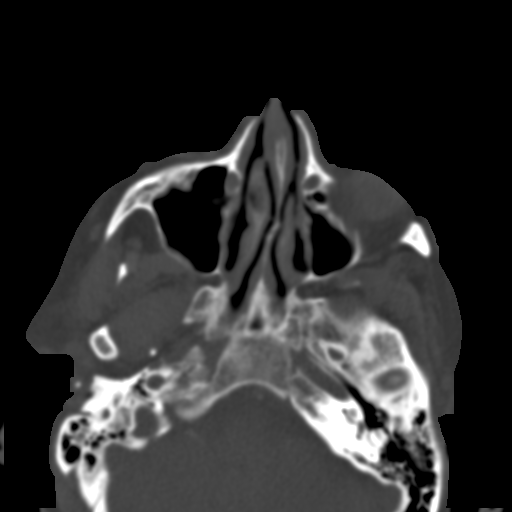
[im 61/81  brain]
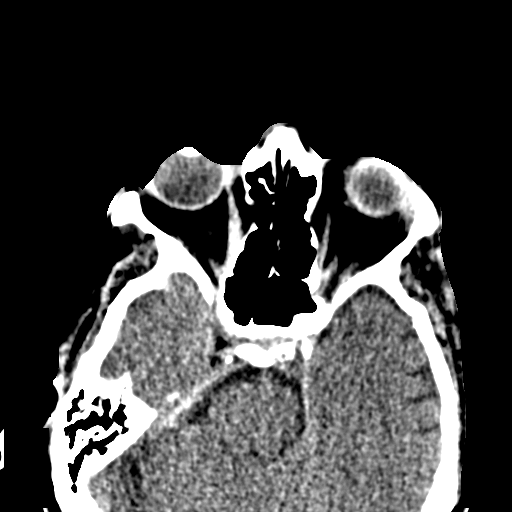
[im 71/81  brain]
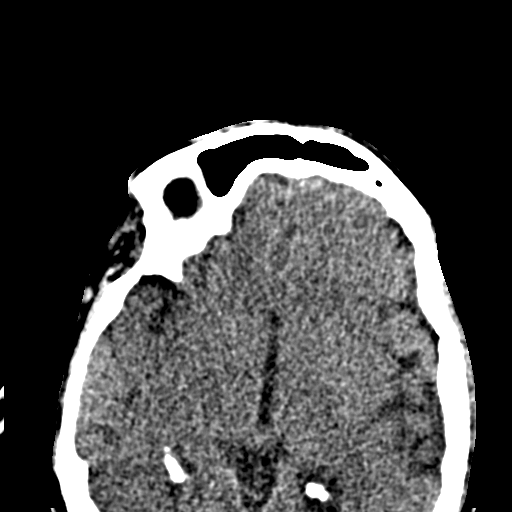

[Series 11: sagittal soft tissue · sagittal · 0.30mm/px · 3 of 68 slices shown]
[im 17/68  brain]
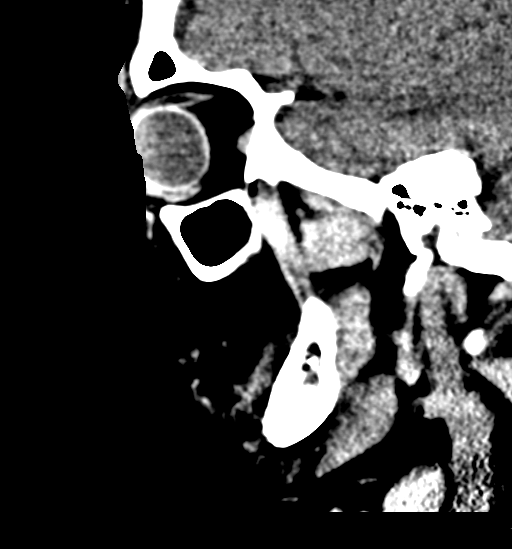
[im 34/68  brain]
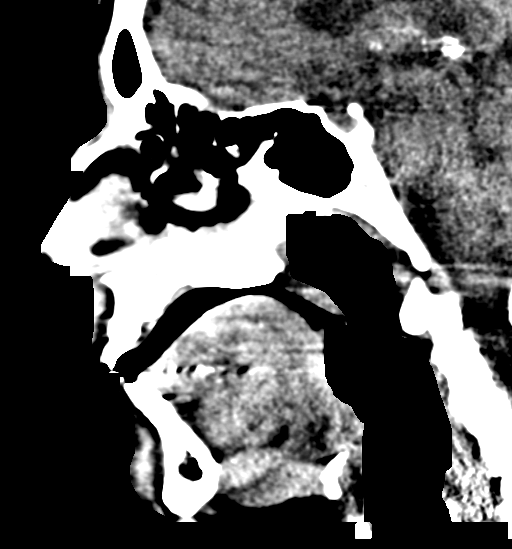
[im 51/68  brain]
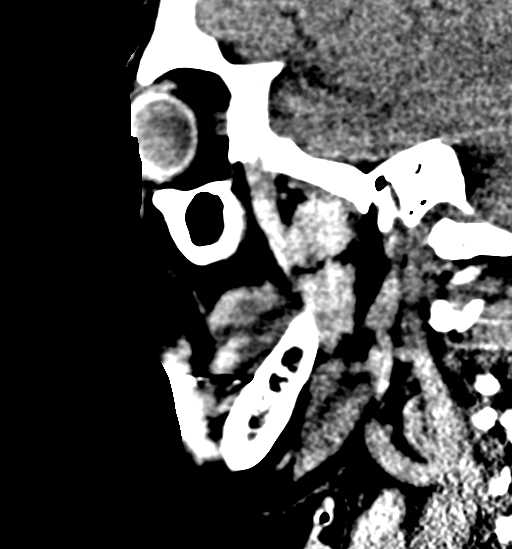

[Series 17: coronals · coronal · 0.26mm/px · 1 of 32 slices shown]
[im 16/32  brain]
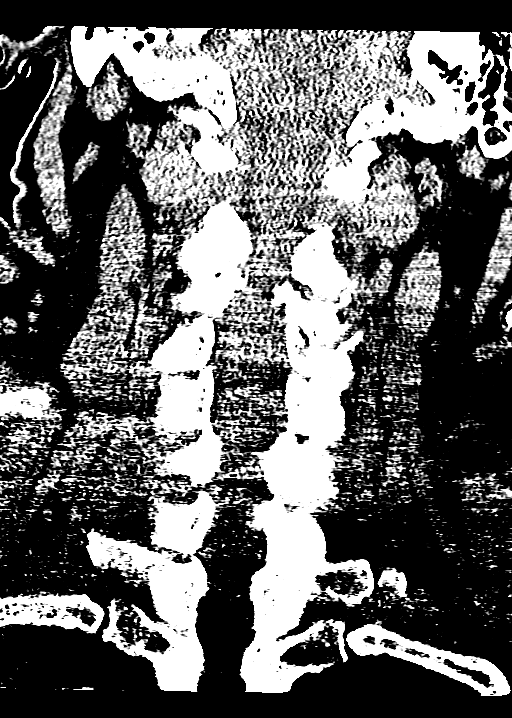

[Series 19: orthogonals · axial · 0.23mm/px · z∈[-290,-209]mm · 5 of 76 slices shown]
[im 11/76  brain]
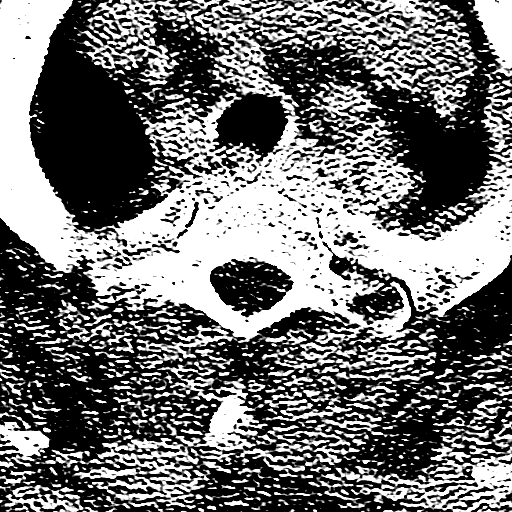
[im 22/76  brain]
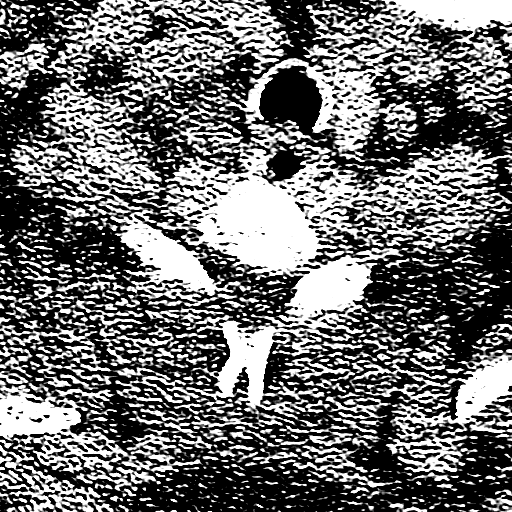
[im 33/76  brain]
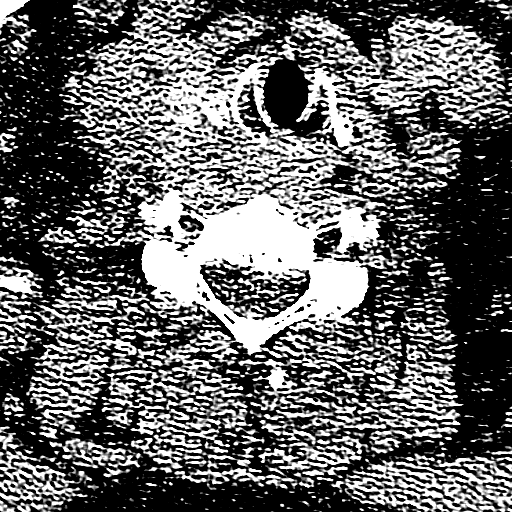
[im 43/76  brain]
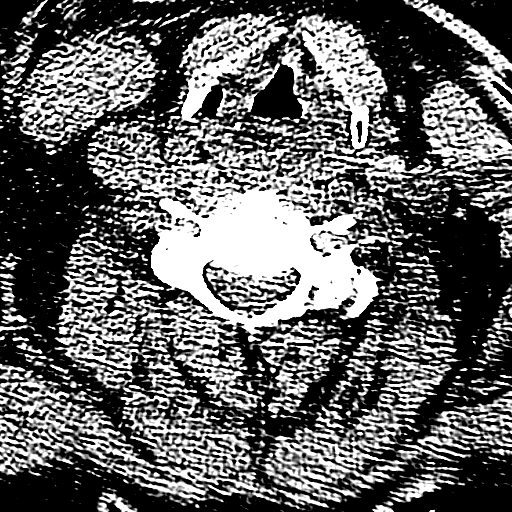
[im 54/76  brain]
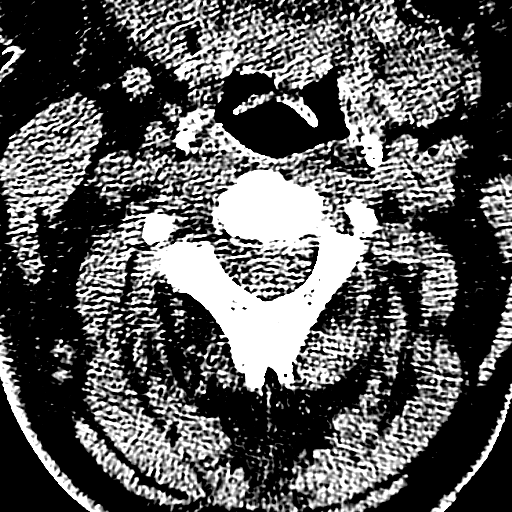

[16 of 47 positions shown; findings below may reference images not displayed]

FINDINGS: CT HEAD FINDINGS

Ventricles are normal in configuration. There is ventricular and
sulcal enlargement reflecting mild atrophy, advanced for a patient
of this age. There are no parenchymal masses or mass effect. Areas
of white matter hypoattenuation are noted consistent with mild
chronic microvascular ischemic change. There is no evidence of an
infarct.

There are no extra-axial masses or abnormal fluid collections.

No intracranial hemorrhage.

No skull fracture.

CT MAXILLOFACIAL FINDINGS

No acute fracture. There are old medial orbital wall fractures, more
prominent on the right.

Mild right maxillary sinus mucosal thickening. The sinuses are
otherwise clear. Clear mastoid air cells and middle ear cavities.

There is left cheek soft tissue swelling.

Normal globes and orbits.  No soft tissue masses.  No adenopathy.

CT CERVICAL SPINE FINDINGS

Study is somewhat degraded by motion.

There is no convincing fracture. There is a grade 1 anterolisthesis
of C3 and C4. There is irregular facet degenerative change on the
left at C2-C3 and C3-C4 with mild degenerative change of the right
C2-C3 facet. There is loss of disc height at C3-C4 through C7-T1.

There is a questionable oval soft tissue mass measuring 2 cm x
cm in the right upper mediastinum adjacent to the upper thoracic
trachea. Reflect adenopathy. It is in close proximity to the
inferior aspect of the thyroid gland and could potentially be
thyroid in origin.
IMPRESSION: HEAD CT: No acute intracranial abnormality. Mild atrophy, advanced
for age. Mild chronic microvascular ischemic change.

MAXILLOFACIAL CT: No acute fracture. Old medial orbital wall
fractures. Left cheek/parotid region soft tissue contusion.

CERVICAL CT:  No fracture or acute finding.

## 2015-01-23 ENCOUNTER — Encounter (HOSPITAL_COMMUNITY): Payer: Self-pay

## 2015-01-23 ENCOUNTER — Emergency Department (HOSPITAL_COMMUNITY)
Admission: EM | Admit: 2015-01-23 | Discharge: 2015-01-23 | Discharge status: Against Medical Advice | Payer: Medicare Other | Attending: Emergency Medicine | Admitting: Emergency Medicine

## 2015-01-23 DIAGNOSIS — Z046 Encounter for general psychiatric examination, requested by authority: Secondary | ICD-10-CM | POA: Diagnosis present

## 2015-01-23 LAB — CBC
HCT: 37.6 % (ref 36.0–46.0)
Hemoglobin: 13.3 g/dL (ref 12.0–15.0)
MCH: 34.5 pg — ABNORMAL HIGH (ref 26.0–34.0)
MCHC: 35.4 g/dL (ref 30.0–36.0)
MCV: 97.7 fL (ref 78.0–100.0)
PLATELETS: 111 10*3/uL — AB (ref 150–400)
RBC: 3.85 MIL/uL — ABNORMAL LOW (ref 3.87–5.11)
RDW: 14.2 % (ref 11.5–15.5)
WBC: 2.4 10*3/uL — ABNORMAL LOW (ref 4.0–10.5)

## 2015-01-23 LAB — COMPREHENSIVE METABOLIC PANEL
ALT: 38 U/L — ABNORMAL HIGH (ref 0–35)
AST: 70 U/L — AB (ref 0–37)
Albumin: 3.5 g/dL (ref 3.5–5.2)
Alkaline Phosphatase: 128 U/L — ABNORMAL HIGH (ref 39–117)
Anion gap: 4 — ABNORMAL LOW (ref 5–15)
BILIRUBIN TOTAL: 1.4 mg/dL — AB (ref 0.3–1.2)
BUN: 6 mg/dL (ref 6–23)
CHLORIDE: 103 mmol/L (ref 96–112)
CO2: 32 mmol/L (ref 19–32)
Calcium: 8.8 mg/dL (ref 8.4–10.5)
Creatinine, Ser: 0.88 mg/dL (ref 0.50–1.10)
GFR calc Af Amer: 87 mL/min — ABNORMAL LOW (ref >90)
GFR calc non Af Amer: 75 mL/min — ABNORMAL LOW (ref >90)
GLUCOSE: 138 mg/dL — AB (ref 70–99)
Potassium: 3.8 mmol/L (ref 3.5–5.1)
Sodium: 139 mmol/L (ref 135–145)
Total Protein: 7.2 g/dL (ref 6.0–8.3)

## 2015-01-23 LAB — ETHANOL: Alcohol, Ethyl (B): 227 mg/dL — ABNORMAL HIGH (ref 0–9)

## 2015-01-23 LAB — SALICYLATE LEVEL: Salicylate Lvl: <4 mg/dL (ref 2.8–20.0)

## 2015-01-23 LAB — ACETAMINOPHEN LEVEL

## 2015-01-23 NOTE — ED Notes (Signed)
No answer X 3.

## 2015-01-23 NOTE — ED Notes (Signed)
Pt brought in with [employer] for detox from crack, pills and ETOH. Pt is intoxicated at this time. Pt states she doesn't abuse pills.

## 2015-01-23 NOTE — ED Notes (Signed)
No answer x3

## 2015-02-04 ENCOUNTER — Encounter (HOSPITAL_COMMUNITY): Payer: Self-pay | Admitting: Emergency Medicine

## 2015-02-04 ENCOUNTER — Emergency Department (EMERGENCY_DEPARTMENT_HOSPITAL)
Admission: EM | Admit: 2015-02-04 | Discharge: 2015-02-06 | Disposition: A | Discharge status: Other Acute Inpatient Hospital | Payer: Medicare Other | Source: Home / Self Care | Attending: Emergency Medicine | Admitting: Emergency Medicine

## 2015-02-04 ENCOUNTER — Emergency Department (HOSPITAL_COMMUNITY)
Admission: EM | Admit: 2015-02-04 | Discharge: 2015-02-04 | Disposition: A | Discharge status: Home / Self Care | Payer: Medicare Other | Attending: Emergency Medicine | Admitting: Emergency Medicine

## 2015-02-04 DIAGNOSIS — F314 Bipolar disorder, current episode depressed, severe, without psychotic features: Secondary | ICD-10-CM | POA: Diagnosis present

## 2015-02-04 DIAGNOSIS — Z8742 Personal history of other diseases of the female genital tract: Secondary | ICD-10-CM | POA: Insufficient documentation

## 2015-02-04 DIAGNOSIS — Z79899 Other long term (current) drug therapy: Secondary | ICD-10-CM

## 2015-02-04 DIAGNOSIS — F10230 Alcohol dependence with withdrawal, uncomplicated: Secondary | ICD-10-CM | POA: Insufficient documentation

## 2015-02-04 DIAGNOSIS — F1310 Sedative, hypnotic or anxiolytic abuse, uncomplicated: Secondary | ICD-10-CM | POA: Insufficient documentation

## 2015-02-04 DIAGNOSIS — Z720 Tobacco use: Secondary | ICD-10-CM | POA: Insufficient documentation

## 2015-02-04 DIAGNOSIS — F1020 Alcohol dependence, uncomplicated: Secondary | ICD-10-CM | POA: Diagnosis present

## 2015-02-04 DIAGNOSIS — F319 Bipolar disorder, unspecified: Secondary | ICD-10-CM | POA: Insufficient documentation

## 2015-02-04 DIAGNOSIS — R45851 Suicidal ideations: Secondary | ICD-10-CM

## 2015-02-04 DIAGNOSIS — E039 Hypothyroidism, unspecified: Secondary | ICD-10-CM | POA: Insufficient documentation

## 2015-02-04 DIAGNOSIS — F1420 Cocaine dependence, uncomplicated: Secondary | ICD-10-CM

## 2015-02-04 DIAGNOSIS — F10129 Alcohol abuse with intoxication, unspecified: Secondary | ICD-10-CM | POA: Insufficient documentation

## 2015-02-04 DIAGNOSIS — F419 Anxiety disorder, unspecified: Secondary | ICD-10-CM | POA: Insufficient documentation

## 2015-02-04 DIAGNOSIS — Z8709 Personal history of other diseases of the respiratory system: Secondary | ICD-10-CM

## 2015-02-04 DIAGNOSIS — Z8739 Personal history of other diseases of the musculoskeletal system and connective tissue: Secondary | ICD-10-CM

## 2015-02-04 DIAGNOSIS — Z8719 Personal history of other diseases of the digestive system: Secondary | ICD-10-CM | POA: Insufficient documentation

## 2015-02-04 DIAGNOSIS — F1910 Other psychoactive substance abuse, uncomplicated: Secondary | ICD-10-CM

## 2015-02-04 DIAGNOSIS — Z862 Personal history of diseases of the blood and blood-forming organs and certain disorders involving the immune mechanism: Secondary | ICD-10-CM | POA: Insufficient documentation

## 2015-02-04 DIAGNOSIS — F1410 Cocaine abuse, uncomplicated: Secondary | ICD-10-CM | POA: Diagnosis not present

## 2015-02-04 DIAGNOSIS — F329 Major depressive disorder, single episode, unspecified: Secondary | ICD-10-CM | POA: Diagnosis present

## 2015-02-04 LAB — CBC
HCT: 39 % (ref 36.0–46.0)
Hemoglobin: 13.3 g/dL (ref 12.0–15.0)
MCH: 33.3 pg (ref 26.0–34.0)
MCHC: 34.1 g/dL (ref 30.0–36.0)
MCV: 97.5 fL (ref 78.0–100.0)
PLATELETS: 75 10*3/uL — AB (ref 150–400)
RBC: 4 MIL/uL (ref 3.87–5.11)
RDW: 13.1 % (ref 11.5–15.5)
WBC: 2.5 10*3/uL — AB (ref 4.0–10.5)

## 2015-02-04 LAB — ACETAMINOPHEN LEVEL

## 2015-02-04 LAB — COMPREHENSIVE METABOLIC PANEL
ALBUMIN: 3.7 g/dL (ref 3.5–5.2)
ALT: 28 U/L (ref 0–35)
ANION GAP: 12 (ref 5–15)
AST: 61 U/L — ABNORMAL HIGH (ref 0–37)
Alkaline Phosphatase: 107 U/L (ref 39–117)
BUN: 9 mg/dL (ref 6–23)
CO2: 23 mmol/L (ref 19–32)
CREATININE: 0.75 mg/dL (ref 0.50–1.10)
Calcium: 9.1 mg/dL (ref 8.4–10.5)
Chloride: 101 mmol/L (ref 96–112)
GFR calc Af Amer: >90 mL/min (ref >90)
GFR calc non Af Amer: >90 mL/min (ref >90)
Glucose, Bld: 138 mg/dL — ABNORMAL HIGH (ref 70–99)
Potassium: 3.4 mmol/L — ABNORMAL LOW (ref 3.5–5.1)
Sodium: 136 mmol/L (ref 135–145)
TOTAL PROTEIN: 7.4 g/dL (ref 6.0–8.3)
Total Bilirubin: 1.2 mg/dL (ref 0.3–1.2)

## 2015-02-04 LAB — RAPID URINE DRUG SCREEN, HOSP PERFORMED
Amphetamines: NOT DETECTED
Barbiturates: NOT DETECTED
Benzodiazepines: POSITIVE — AB
Cocaine: POSITIVE — AB
OPIATES: NOT DETECTED
Tetrahydrocannabinol: NOT DETECTED

## 2015-02-04 LAB — SALICYLATE LEVEL: Salicylate Lvl: <4 mg/dL (ref 2.8–20.0)

## 2015-02-04 LAB — ETHANOL: ALCOHOL ETHYL (B): 190 mg/dL — AB (ref 0–9)

## 2015-02-04 MED ORDER — BUPROPION HCL ER (SR) 150 MG PO TB12
150.0000 mg | ORAL_TABLET | Freq: Every day | ORAL | Status: DC
  Administered 2015-02-05 – 2015-02-06 (×2): 150 mg via ORAL
  Filled 2015-02-04 (×2): qty 1

## 2015-02-04 MED ORDER — ALBUTEROL SULFATE HFA 108 (90 BASE) MCG/ACT IN AERS: 1.0000 | INHALATION_SPRAY | Freq: Four times a day (QID) | RESPIRATORY_TRACT | Status: DC | PRN

## 2015-02-04 MED ORDER — DULOXETINE HCL 60 MG PO CPEP
60.0000 mg | ORAL_CAPSULE | Freq: Two times a day (BID) | ORAL | Status: DC
  Administered 2015-02-04 – 2015-02-06 (×4): 60 mg via ORAL
  Filled 2015-02-04 (×5): qty 1

## 2015-02-04 MED ORDER — CARISOPRODOL 350 MG PO TABS
350.0000 mg | ORAL_TABLET | Freq: Two times a day (BID) | ORAL | Status: DC
  Administered 2015-02-04 – 2015-02-06 (×4): 350 mg via ORAL
  Filled 2015-02-04 (×4): qty 1

## 2015-02-04 MED ORDER — CLONAZEPAM 0.5 MG PO TABS
0.5000 mg | ORAL_TABLET | Freq: Two times a day (BID) | ORAL | Status: DC | PRN
  Administered 2015-02-05 – 2015-02-06 (×3): 0.5 mg via ORAL
  Filled 2015-02-04 (×3): qty 1

## 2015-02-04 MED ORDER — LORAZEPAM 1 MG PO TABS: 0.0000 mg | ORAL_TABLET | Freq: Two times a day (BID) | ORAL | Status: DC

## 2015-02-04 MED ORDER — QUETIAPINE FUMARATE 300 MG PO TABS
400.0000 mg | ORAL_TABLET | Freq: Every day | ORAL | Status: DC
  Administered 2015-02-04 – 2015-02-05 (×2): 400 mg via ORAL
  Filled 2015-02-04 (×4): qty 1

## 2015-02-04 MED ORDER — ZOLPIDEM TARTRATE 5 MG PO TABS
5.0000 mg | ORAL_TABLET | Freq: Every evening | ORAL | Status: DC | PRN
  Administered 2015-02-06: 5 mg via ORAL
  Filled 2015-02-04: qty 1

## 2015-02-04 MED ORDER — LORAZEPAM 1 MG PO TABS
0.0000 mg | ORAL_TABLET | Freq: Four times a day (QID) | ORAL | Status: DC
  Administered 2015-02-04 (×2): 1 mg via ORAL
  Filled 2015-02-04 (×2): qty 1

## 2015-02-04 NOTE — ED Notes (Signed)
Pt 3 bags of belongings, two personal belongings bag and one cream and blue bag with red lining, currently at nurses station.

## 2015-02-04 NOTE — ED Provider Notes (Signed)
CSN: [id number]     Arrival date & time 02/04/15  1752 History   First MD Initiated Contact with Patient 02/04/15 1821     Chief Complaint  Patient presents with  . Suicidal     (Consider location/radiation/quality/duration/timing/severity/associated sxs/prior Treatment) Patient is a 49 y.o. female presenting with mental health disorder.  Mental Health Problem Presenting symptoms: suicidal thoughts   Degree of incapacity (severity):  Moderate Onset quality:  Gradual Duration:  2 days Timing:  Constant Progression:  Worsening Chronicity:  New Context: alcohol use and drug abuse   Treatment compliance:  Untreated Relieved by:  Nothing Worsened by:  Nothing tried Ineffective treatments:  None tried Associated symptoms: fatigue     Past Medical History  Diagnosis Date  . Hypothyroidism   . Blood transfusion July 2012  . Depression   . Anxiety   . Bipolar 1 disorder   . Menorrhagia   . Liver failure   . Bronchitis   . Cirrhosis of liver   . Wegener's granulomatosis   . History of sarcoidosis    Past Surgical History  Procedure Laterality Date  . Tubal ligation    . Laparoscopy    . Orthopedic surgery    . Cholecystectomy    . Fracture surgery    . Hernia repair    . Vaginal hysterectomy  07/27/2011    Procedure: HYSTERECTOMY VAGINAL;  Surgeon: [name], MD;  Location: [facility];  Service: Gynecology;  Laterality: N/A;  Vaginal Hysterectomy with Right Oophorectomy  . Laparotomy  07/28/2011    Procedure: EXPLORATORY LAPAROTOMY;  Surgeon: [name], MD;  Location: [facility];  Service: Gynecology;  Laterality: N/A;   Family History  Problem Relation Age of Onset  . Diabetes Mother   . Hypertension Mother   . Diabetes Daughter   . Hypertension Daughter    History  Substance Use Topics  . Smoking status: Current Some Day Smoker -- 0.25 packs/day  . Smokeless tobacco: Never Used  . Alcohol Use: Yes     Comment: a fifth of vodka and a 12 pk a day of beer    OB  History    Gravida Para Term Preterm AB TAB SAB Ectopic Multiple Living   4 3 3  0 1     3     Review of Systems  Constitutional: Positive for fatigue.  Psychiatric/Behavioral: Positive for suicidal ideas.  All other systems reviewed and are negative.     Allergies  Fluoxetine hcl; Metoclopramide hcl; and Morphine and related  Home Medications   Prior to Admission medications   Medication Sig Start Date End Date Taking? Authorizing Provider  albuterol (PROVENTIL HFA;VENTOLIN HFA) 108 (90 BASE) MCG/ACT inhaler Inhale 1-2 puffs into the lungs every 6 (six) hours as needed for wheezing. 05/16/14 05/16/15 Yes Encarnacion Slates, NP  buPROPion (WELLBUTRIN SR) 150 MG 12 hr tablet Take 150 mg by mouth daily. 01/05/15  Yes Historical Provider, MD  carisoprodol (SOMA) 350 MG tablet Take 350 mg by mouth 2 (two) times daily.   Yes Historical Provider, MD  clonazePAM (KLONOPIN) 0.5 MG tablet Take 0.5 mg by mouth 2 (two) times daily as needed for anxiety (anxiety).   Yes Historical Provider, MD  DULoxetine (CYMBALTA) 60 MG capsule Take 60 mg by mouth 2 (two) times daily. 01/05/15  Yes Historical Provider, MD  QUEtiapine (SEROQUEL) 400 MG tablet Take 400 mg by mouth at bedtime.   Yes Historical Provider, MD  zolpidem (AMBIEN) 5 MG tablet Take 5 mg  by mouth at bedtime as needed for sleep (sleep).   Yes Historical Provider, MD  docusate sodium (COLACE) 100 MG capsule Take 1 capsule (100 mg total) by mouth 2 (two) times daily. (This is an over the counter medicine): For constipation Patient not taking: Reported on 02/04/2015 07/29/14   Antonietta Breach, PA-C  gabapentin (NEURONTIN) 300 MG capsule Take 1 capsule (300 mg total) by mouth 3 (three) times daily. For substance withdrawal syndrome Patient not taking: Reported on 02/04/2015 05/16/14   Encarnacion Slates, NP  lithium carbonate 300 MG capsule Take 1 capsule (300 mg total) by mouth 2 (two) times daily with a meal. For mood stabilization Patient not taking: Reported on  02/04/2015 05/16/14   Encarnacion Slates, NP  OLANZapine (ZYPREXA) 5 MG tablet Take 1 tablet (5 mg total) by mouth 2 (two) times daily. For mood control Patient not taking: Reported on 02/04/2015 05/16/14   Encarnacion Slates, NP  polyethylene glycol powder (GLYCOLAX/MIRALAX) powder Take 17 g by mouth daily. Until daily soft stools  OTC Patient not taking: Reported on 02/04/2015 07/29/14   Antonietta Breach, PA-C  propranolol (INDERAL) 20 MG tablet Take 1 tablet (20 mg total) by mouth 2 (two) times daily. For anxiety Patient not taking: Reported on 02/04/2015 05/16/14   Encarnacion Slates, NP  thyroid (ARMOUR) 30 MG tablet Take 1 tablet (30 mg total) by mouth daily before breakfast. For low thyroid function Patient not taking: Reported on 02/04/2015 05/16/14   Encarnacion Slates, NP  traZODone (DESYREL) 100 MG tablet Take 1 tablet (100 mg total) by mouth at bedtime and may repeat dose one time if needed. For sleep Patient not taking: Reported on 02/04/2015 05/16/14   Encarnacion Slates, NP   BP 125/87 mmHg  Pulse 91  Temp(Src) 98.5 F (36.9 C) (Oral)  Resp 18  SpO2 99% Physical Exam  Constitutional: She is oriented to person, place, and time. She appears well-developed and well-nourished.  HENT:  Head: Normocephalic and atraumatic.  Right Ear: External ear normal.  Left Ear: External ear normal.  Eyes: Conjunctivae and EOM are normal. Pupils are equal, round, and reactive to light.  Neck: Normal range of motion. Neck supple.  Cardiovascular: Normal rate, regular rhythm, normal heart sounds and intact distal pulses.   Pulmonary/Chest: Effort normal and breath sounds normal.  Abdominal: Soft. Bowel sounds are normal. There is tenderness in the right upper quadrant.  Musculoskeletal: Normal range of motion.  Neurological: She is alert and oriented to person, place, and time.  Skin: Skin is warm and dry.  Vitals reviewed.   ED Course  Procedures (including critical care time) Labs Review Labs Reviewed - No data to  display  Imaging Review No results found.   EKG Interpretation None      MDM   Final diagnoses:  Suicidal ideations    49 y.o. female with pertinent PMH of depression, anxiety, bipolar 1, hepatitis presents with recurrent suicidal ideations. Patient was discharged just prior to my visit after she was cleared by psychiatry. The patient states "if I go home I will go home and kill myself".  She denies plan, no homicidal ideations. Medically no new issues.  Pysch recommends inpatient admission, no beds at [facility].  Placement pending  I have reviewed all laboratory and imaging studies if ordered as above  1. Suicidal ideations         Debby Freiberg, MD 02/04/15 [phone]

## 2015-02-04 NOTE — BH Assessment (Signed)
Assessment Note  Erin [name] is an 49 y.o. female with history of depression, anxiety, and Bipolar I Disorder. The patient has a history of significant alcohol abuse. Today she is requesting detox and wants treatment at [facility].  She states that she has been in alcohol detox multiple times in the past but in the last month and a half she has started drinking again after 8 months of sobriety secondary to her life situation and family stressors. There have been multiple people in the family with alcoholism and a brother whom she found dead in a hotel after suicide. Patient's age of first use is 49 yrs old. Her last drink was last night.   Per ED notes, patient also using multiple different pills. Patient did not admit to use of pills. She did however admit to use of crack cocaine. Last use was last night.   She denies suicidal thoughts or self, she states that she is not hallucinating, she does feel significantly depressed.   Axis I: Alcohol Abuse, Anxiety Disorder NOS and Depressive Disorder NOS Axis II: Deferred Axis III:  Past Medical History  Diagnosis Date  . Hypothyroidism   . Blood transfusion July 2012  . Depression   . Anxiety   . Bipolar 1 disorder   . Menorrhagia   . Liver failure   . Bronchitis   . Cirrhosis of liver   . Wegener's granulomatosis   . History of sarcoidosis    Axis IV: other psychosocial or environmental problems, problems related to social environment, problems with access to health care services and problems with primary support group Axis V: 31-40 impairment in reality testing  Past Medical History:  Past Medical History  Diagnosis Date  . Hypothyroidism   . Blood transfusion July 2012  . Depression   . Anxiety   . Bipolar 1 disorder   . Menorrhagia   . Liver failure   . Bronchitis   . Cirrhosis of liver   . Wegener's granulomatosis   . History of sarcoidosis     Past Surgical History  Procedure Laterality Date  . Tubal ligation    .  Laparoscopy    . Orthopedic surgery    . Cholecystectomy    . Fracture surgery    . Hernia repair    . Vaginal hysterectomy  07/27/2011    Procedure: HYSTERECTOMY VAGINAL;  Surgeon: [name], MD;  Location: [facility];  Service: Gynecology;  Laterality: N/A;  Vaginal Hysterectomy with Right Oophorectomy  . Laparotomy  07/28/2011    Procedure: EXPLORATORY LAPAROTOMY;  Surgeon: [name], MD;  Location: [facility];  Service: Gynecology;  Laterality: N/A;    Family History:  Family History  Problem Relation Age of Onset  . Diabetes Mother   . Hypertension Mother   . Diabetes Daughter   . Hypertension Daughter     Social History:  reports that she has been smoking.  She has never used smokeless tobacco. She reports that she drinks alcohol. She reports that she uses illicit drugs (Cocaine and Marijuana).  Additional Social History:  Alcohol / Drug Use Pain Medications: SEE MAR Prescriptions: SEE MAR Over the Counter: SEE MAR History of alcohol / drug use?: Yes Longest period of sobriety (when/how long): 1.5-2 yrs  Substance #1 Name of Substance 1: Alcohol  1 - Age of First Use: 49 yrs old  1 - Amount (size/oz): alcohol binges 1 - Frequency: daily  1 - Duration: daily since Christmas 2015 1 - Last Use /  Amount: last night 02/03/2015 Substance #2 Name of Substance 2: Cocaine  2 - Age of First Use: 49 yrs old 2 - Amount (size/oz): cocaine binges  2 - Frequency: daily  2 - Duration: daily starting Christmas 2015 2 - Last Use / Amount: last night 02/03/2015  CIWA: CIWA-Ar BP: 118/83 mmHg Pulse Rate: 102 COWS:    Allergies:  Allergies  Allergen Reactions  . Fluoxetine Hcl Other (See Comments)     hyperactivity  . Metoclopramide Hcl Other (See Comments)    depression  . Morphine And Related Other (See Comments)    High doses cause hallucinations    Home Medications:  (Not in a hospital admission)  OB/GYN Status:  No LMP recorded. Patient has had a hysterectomy.  General  Assessment Data Location of Assessment: [facility] ED Is this a Tele or Face-to-Face Assessment?: Face-to-Face Is this an Initial Assessment or a Re-assessment for this encounter?: Initial Assessment Living Arrangements: Alone Can pt return to current living arrangement?: Yes Admission Status: Voluntary Is patient capable of signing voluntary admission?: Yes Transfer from: [facility] Referral Source: Self/Family/Friend     [place] Living Arrangements: Alone Name of Psychiatrist:  (No psychiatrist ) Name of Therapist:  (No therapist )  Education Status Is patient currently in school?: No  Risk to self with the past 6 months Suicidal Ideation: No Suicidal Intent: No Is patient at risk for suicide?: No Suicidal Plan?: No Access to Means: No What has been your use of drugs/alcohol within the last 12 months?:  (n/a) Previous Attempts/Gestures: Yes How many times?:  (4 prior suicide attempts (overdoses)) Other Self Harm Risks:  (n/a) Triggers for Past Attempts: Other (Comment) (depression and multiple deaths by family members) Intentional Self Injurious Behavior: None Family Suicide History: Unknown Recent stressful life event(s): Other (Comment) (the death of many family members ) Persecutory voices/beliefs?: No Depression: Yes Depression Symptoms: Feeling angry/irritable, Loss of interest in usual pleasures, Feeling worthless/self pity, Guilt, Fatigue, Isolating, Tearfulness, Insomnia, Despondent Substance abuse history and/or treatment for substance abuse?: No Suicide prevention information given to non-admitted patients: Not applicable  Risk to Others within the past 6 months Homicidal Ideation: No Thoughts of Harm to Others: No Current Homicidal Intent: No Current Homicidal Plan: No Access to Homicidal Means: No Identified Victim:  (n/a) History of harm to others?: No Assessment of Violence: None Noted Violent Behavior Description:  (patient is calm and  coopeative ) Does patient have access to weapons?: No Criminal Charges Pending?: No Does patient have a court date: No  Psychosis Hallucinations: None noted Delusions: None noted  Mental Status Report Appear/Hygiene: Disheveled Eye Contact: Good Motor Activity: Freedom of movement Speech: Logical/coherent Level of Consciousness: Alert Mood: Depressed Affect: Depressed Anxiety Level: None Thought Processes: Coherent, Relevant Judgement: Impaired Orientation: Person, Place, Time, Situation Obsessive Compulsive Thoughts/Behaviors: None  Cognitive Functioning Concentration: Decreased Memory: Recent Intact, Remote Intact IQ: Average Insight: Poor Impulse Control: Poor Appetite: Poor Weight Loss:  (varies ) Weight Gain:  (none reported) Sleep: Decreased Total Hours of Sleep:  (4-6 hrs per night ) Vegetative Symptoms: None  ADLScreening [facility] Assessment Services) Patient's cognitive ability adequate to safely complete daily activities?: Yes Patient able to express need for assistance with ADLs?: Yes Independently performs ADLs?: Yes (appropriate for developmental age)  Prior Inpatient Therapy Prior Inpatient Therapy: Yes Prior Therapy Dates:  (multiple times at [facility]- patient unable to recall dates of adm) Prior Therapy Facilty/Provider(s):  [facility]) Reason for Treatment:  (substance abuse)  Prior Outpatient  Therapy Prior Outpatient Therapy: Yes Prior Therapy Dates:  (AA meetings-currently) Prior Therapy Facilty/Provider(s):  ([facility] meetings) Reason for Treatment:  (substance abuse treatment )  ADL Screening (condition at time of admission) Patient's cognitive ability adequate to safely complete daily activities?: Yes Is the patient deaf or have difficulty hearing?: No Does the patient have difficulty seeing, even when wearing glasses/contacts?: No Does the patient have difficulty concentrating, remembering, or making decisions?: Yes Patient able to express need for  assistance with ADLs?: Yes Does the patient have difficulty dressing or bathing?: No Independently performs ADLs?: Yes (appropriate for developmental age) Does the patient have difficulty walking or climbing stairs?: No Weakness of Legs: None Weakness of Arms/Hands: None  Home Assistive Devices/Equipment Home Assistive Devices/Equipment: None    Abuse/Neglect Assessment (Assessment to be complete while patient is alone) Physical Abuse: Denies Verbal Abuse: Yes, past (Comment) Sexual Abuse: Denies Exploitation of patient/patient's resources: Denies Self-Neglect: Denies Values / Beliefs Cultural Requests During Hospitalization: None Spiritual Requests During Hospitalization: None   Advance Directives (For Healthcare) Does patient have an advance directive?: No Would patient like information on creating an advanced directive?: No - patient declined information    Additional Information 1:1 In Past 12 Months?: No CIRT Risk: No Elopement Risk: No Does patient have medical clearance?: Yes     Disposition:  Disposition Initial Assessment Completed for this Encounter: Yes Disposition of Patient: Other dispositions (Pending consult by psychiatry this morning to determine disp)  On Site Evaluation by:   Reviewed with Physician:    Waldon Merl [facility] 02/04/2015 8:48 AM

## 2015-02-04 NOTE — BH Assessment (Signed)
Patient was seen by TTS and psychiatry earlier today. Psychiatry (Dr. Darleene Cleaver and Reginold Agent, NP) recommended discharge home. Patient did not meet criteria for a inpatient admission. Patient given follow up referrals.   Writer later informed by Lynnda Shields, RN that patient checked herself back into the ED. Anderson Malta asked this Probation officer to come speak with patient. Writer met with patient whom stated that she is not safe to return back home. Patient sts, "I have tried to kill myself 5-6x's in the past and I don't want to do it again". Patient afraid that if she returns home she may try to harm self again. Patient unable to contract for safety. Patient also mentions that she doesn't know how to restrain herself from drinking and "I want to dry out".   Writer informed Anderson Malta, RN of our conversation especially patient's suicidal ideations. Per Anderson Malta, patient will be re-registered as a patient again. Writer discussed with Reginold Agent, NP. Patient to remain in the ED for placement. Patient not a candidate for [facility], per Josephine, TTS to seek appropriate placement.

## 2015-02-04 NOTE — Consult Note (Signed)
[place] [facility] Consult   Reason for Consult:  Alcohol USE DISORDER, SEVERE Referring Physician: EDP Patient Identification: [name] MRN:  [id number] Principal Diagnosis: Alcohol dependence Diagnosis:   Patient Active Problem List   Diagnosis Date Noted  . Alcohol dependence [F10.20] 07/17/2013    Priority: High  . Opioid dependence [F11.20] 05/06/2014  . Cocaine dependence [F14.20] 07/17/2013  . Thrombocytopenia [D69.6] 01/06/2013  . Bipolar 1 disorder [F31.9]   . Sarcoidosis [D86.9] 03/30/2010  . HYPERTENSION, BENIGN ESSENTIAL [I10] 01/11/2010  . HYPOTHYROIDISM [E03.9] 03/02/2009  . ALCOHOLIC LIVER DISEASE [E31.5] 03/06/2008  . DISORDER, BIPOLAR NOS [F31.9] 08/13/2007  . CIRRHOSIS, ALCOHOLIC, LIVER [Q00.86] 76/19/5093  . TOBACCO DEPENDENCE [Z72.0] 02/22/2007  . EXTERNAL HEMORRHOIDS [K64.8] 01/11/2005  . PORTAL HYPERTENSION [K76.6] 01/11/2005    Total Time spent with patient: 45 minutes  Subjective:   Erin [name] is a 49 y.o. female patient admitted with Alcohol dependence.  HPI:  Caucasian female, 49 years old was seen this morning for Alcohol dependence.  Patient reported that she binges on Alcohol and that she was clean for a year.  Patient reports that she relapsed few months ago and that she does not drink daily.  Patient reports that she was hospitalized in [facility] two years ago.  Patient was unable to quantify her use of Alcohol, Cocaine and Marijuana.  Patient's UDS and Alcohol came back negative.  Patient has not been compliant with her medications and has not been seeing her outpatient Psychiatrist.   She reports a MH hx of Bipolar disorder and Schizophrenia. Patient denies SI/HI/AVH but stated that she will attempt to kill herself if she is not admitted to [facility].  Patient also stated that since her UDS came back with nothing in her system he will go out and drink and use drugs and come back.  Patient reported previous suicide attempt but was not able  to state when and what she did.  Patient did not have symptoms of withdrawing from substances.  Patient reported good sleep and appetite.  Patient is up for discharge and will be referred to outpatient rehabilitation facilities.  HPI Elements:   Location:  Alcohol use disorder,. Quality:  Moderate. Severity:  moderate. Timing:  Acute. Duration:  Chronic mental illness, substance abuse. Context:  Seeking detox treatment..  Past Medical History:  Past Medical History  Diagnosis Date  . Hypothyroidism   . Blood transfusion July 2012  . Depression   . Anxiety   . Bipolar 1 disorder   . Menorrhagia   . Liver failure   . Bronchitis   . Cirrhosis of liver   . Wegener's granulomatosis   . History of sarcoidosis     Past Surgical History  Procedure Laterality Date  . Tubal ligation    . Laparoscopy    . Orthopedic surgery    . Cholecystectomy    . Fracture surgery    . Hernia repair    . Vaginal hysterectomy  07/27/2011    Procedure: HYSTERECTOMY VAGINAL;  Surgeon: [name], MD;  Location: [facility];  Service: Gynecology;  Laterality: N/A;  Vaginal Hysterectomy with Right Oophorectomy  . Laparotomy  07/28/2011    Procedure: EXPLORATORY LAPAROTOMY;  Surgeon: [name], MD;  Location: [facility];  Service: Gynecology;  Laterality: N/A;   Family History:  Family History  Problem Relation Age of Onset  . Diabetes Mother   . Hypertension Mother   . Diabetes Daughter   . Hypertension Daughter    Social History:  History  Alcohol Use  . Yes    Comment: a fifth of vodka and a 12 pk a day of beer      History  Drug Use  . Yes  . Special: Cocaine, Marijuana    Comment: opioids, benzos    History   Social History  . Marital Status: Divorced    Spouse Name: N/A  . Number of Children: N/A  . Years of Education: N/A   Social History Main Topics  . Smoking status: Current Some Day Smoker -- 0.25 packs/day  . Smokeless tobacco: Never Used  . Alcohol Use: Yes     Comment: a  fifth of vodka and a 12 pk a day of beer   . Drug Use: Yes    Special: Cocaine, Marijuana     Comment: opioids, benzos  . Sexual Activity: No   Other Topics Concern  . None   Social History Narrative   Additional Social History:    Pain Medications: SEE MAR Prescriptions: SEE MAR Over the Counter: SEE MAR History of alcohol / drug use?: Yes Longest period of sobriety (when/how long): 1.5-2 yrs  Name of Substance 1: Alcohol  1 - Age of First Use: 49 yrs old  1 - Amount (size/oz): alcohol binges 1 - Frequency: daily  1 - Duration: daily since Christmas 2015 1 - Last Use / Amount: last night 02/03/2015 Name of Substance 2: Cocaine  2 - Age of First Use: 49 yrs old 2 - Amount (size/oz): cocaine binges  2 - Frequency: daily  2 - Duration: daily starting Christmas 2015 2 - Last Use / Amount: last night 02/03/2015                 Allergies:   Allergies  Allergen Reactions  . Fluoxetine Hcl Other (See Comments)     hyperactivity  . Metoclopramide Hcl Other (See Comments)    depression  . Morphine And Related Other (See Comments)    High doses cause hallucinations    Vitals: Blood pressure 133/78, pulse 100, temperature 97.6 F (36.4 C), temperature source Oral, resp. rate 18, SpO2 98 %.  Risk to Self: Suicidal Ideation: No Suicidal Intent: No Is patient at risk for suicide?: No Suicidal Plan?: No Access to Means: No What has been your use of drugs/alcohol within the last 12 months?:  (n/a) How many times?:  (4 prior suicide attempts (overdoses)) Other Self Harm Risks:  (n/a) Triggers for Past Attempts: Other (Comment) (depression and multiple deaths by family members) Intentional Self Injurious Behavior: None Risk to Others: Homicidal Ideation: No Thoughts of Harm to Others: No Current Homicidal Intent: No Current Homicidal Plan: No Access to Homicidal Means: No Identified Victim:  (n/a) History of harm to others?: No Assessment of Violence: None  Noted Violent Behavior Description:  (patient is calm and coopeative ) Does patient have access to weapons?: No Criminal Charges Pending?: No Does patient have a court date: No Prior Inpatient Therapy: Prior Inpatient Therapy: Yes Prior Therapy Dates:  (multiple times at [facility]- patient unable to recall dates of adm) Prior Therapy Facilty/Provider(s):  [facility]) Reason for Treatment:  (substance abuse) Prior Outpatient Therapy: Prior Outpatient Therapy: Yes Prior Therapy Dates:  ([facility] meetings-currently) Prior Therapy Facilty/Provider(s):  ([facility] meetings) Reason for Treatment:  (substance abuse treatment )  Current Facility-Administered Medications  Medication Dose Route Frequency Provider Last Rate Last Dose  . LORazepam (ATIVAN) tablet 0-4 mg  0-4 mg Oral 4 times per day Johnna Acosta, MD  1 mg at 02/04/15 1202   Followed by  . [START ON 02/06/2015] LORazepam (ATIVAN) tablet 0-4 mg  0-4 mg Oral Q12H Johnna Acosta, MD       Current Outpatient Prescriptions  Medication Sig Dispense Refill  . buPROPion (WELLBUTRIN SR) 150 MG 12 hr tablet Take 150 mg by mouth daily.  2  . carisoprodol (SOMA) 350 MG tablet Take 350 mg by mouth 2 (two) times daily.    . clonazePAM (KLONOPIN) 0.5 MG tablet Take 0.5 mg by mouth 2 (two) times daily as needed for anxiety (anxiety).    . DULoxetine (CYMBALTA) 60 MG capsule Take 60 mg by mouth 2 (two) times daily.  2  . QUEtiapine (SEROQUEL) 400 MG tablet Take 400 mg by mouth at bedtime.    Marland Kitchen zolpidem (AMBIEN) 5 MG tablet Take 5 mg by mouth at bedtime as needed for sleep (sleep).    Marland Kitchen albuterol (PROVENTIL HFA;VENTOLIN HFA) 108 (90 BASE) MCG/ACT inhaler Inhale 1-2 puffs into the lungs every 6 (six) hours as needed for wheezing.    . docusate sodium (COLACE) 100 MG capsule Take 1 capsule (100 mg total) by mouth 2 (two) times daily. (This is an over the counter medicine): For constipation (Patient not taking: Reported on 02/04/2015) 10 capsule 0  . DULoxetine (CYMBALTA) 30 MG  capsule Take 1 capsule (30 mg total) by mouth daily. For depression (Patient not taking: Reported on 02/04/2015) 30 capsule 0  . gabapentin (NEURONTIN) 300 MG capsule Take 1 capsule (300 mg total) by mouth 3 (three) times daily. For substance withdrawal syndrome (Patient not taking: Reported on 02/04/2015) 90 capsule 0  . lithium carbonate 300 MG capsule Take 1 capsule (300 mg total) by mouth 2 (two) times daily with a meal. For mood stabilization (Patient not taking: Reported on 02/04/2015) 60 capsule 0  . OLANZapine (ZYPREXA) 5 MG tablet Take 1 tablet (5 mg total) by mouth 2 (two) times daily. For mood control (Patient not taking: Reported on 02/04/2015) 60 tablet 0  . polyethylene glycol powder (GLYCOLAX/MIRALAX) powder Take 17 g by mouth daily. Until daily soft stools  OTC (Patient not taking: Reported on 02/04/2015) 255 g 0  . promethazine (PHENERGAN) 25 MG tablet Take 1 tablet (25 mg total) by mouth every 6 (six) hours as needed for nausea or vomiting. (Patient not taking: Reported on 02/04/2015) 12 tablet 0  . propranolol (INDERAL) 20 MG tablet Take 1 tablet (20 mg total) by mouth 2 (two) times daily. For anxiety (Patient not taking: Reported on 02/04/2015) 60 tablet 0  . thyroid (ARMOUR) 30 MG tablet Take 1 tablet (30 mg total) by mouth daily before breakfast. For low thyroid function (Patient not taking: Reported on 02/04/2015) 30 tablet 0  . traZODone (DESYREL) 100 MG tablet Take 1 tablet (100 mg total) by mouth at bedtime and may repeat dose one time if needed. For sleep (Patient not taking: Reported on 02/04/2015) 60 tablet 0    Musculoskeletal: Strength & Muscle Tone: within normal limits Gait & Station: normal Patient leans: N/A  Psychiatric Specialty Exam:     Blood pressure 133/78, pulse 100, temperature 97.6 F (36.4 C), temperature source Oral, resp. rate 18, SpO2 98 %.There is no weight on file to calculate BMI.  General Appearance: Casual  Eye Contact::  Fair  Speech:  Clear and  Coherent and Normal Rate  Volume:  Normal  Mood:  Anxious and Depressed  Affect:  Congruent  Thought Process:  Coherent, Goal Directed and Intact  Orientation:  Full (Time, Place, and Person)  Thought Content:  WDL  Suicidal Thoughts:  Denies but stated she might do something to her self if discharged home.  Homicidal Thoughts:  No  Memory:  Immediate;   Good Recent;   Good Remote;   Fair  Judgement:  Fair  Insight:  Shallow  Psychomotor Activity:  Normal  Concentration:  Good  Recall:  NA  Fund of Knowledge:Fair  Language: Good  Akathisia:  NA  Handed:  Right  AIMS (if indicated):     Assets:  Desire for Improvement  ADL's:  Intact  Cognition: WNL  Sleep:      Medical Decision Making: Established Problem, Stable/Improving (1)  Treatment Plan Summary: Plan Discharge home  Plan:  Discharge home Disposition: Discharge  Home with Referrals.  Delfin Gant   PMHNP-BC 02/04/2015 3:46 PM  Patient seen, evaluated and I agree with notes by Nurse Practitioner. Corena Pilgrim, MD

## 2015-02-04 NOTE — [facility] Suicide Risk Assessment (Cosign Needed)
Suicide Risk Assessment  Discharge Assessment   [facility] Discharge Suicide Risk Assessment   Demographic Factors:  Caucasian, Living alone and Unemployed  Total Time spent with patient: 20 minutes  Musculoskeletal: Strength & Muscle Tone: within normal limits Gait & Station: normal Patient leans: N/A  Psychiatric Specialty Exam:     Blood pressure 133/78, pulse 100, temperature 97.6 F (36.4 C), temperature source Oral, resp. rate 18, SpO2 98 %.There is no weight on file to calculate BMI.  General Appearance: Casual  Eye Contact::  Fair  Speech:  Clear and Coherent and Normal Rate409  Volume:  Normal  Mood:  Anxious and Depressed  Affect:  Congruent, Depressed and Flat  Thought Process:  Coherent, Goal Directed and Intact  Orientation:  Full (Time, Place, and Person)  Thought Content:  WDL  Suicidal Thoughts:  No  Homicidal Thoughts:  No  Memory:  Immediate;   Good Recent;   Good Remote;   Fair  Judgement:  Fair  Insight:  Shallow  Psychomotor Activity:  Normal  Concentration:  Good  Recall:  NA  Fund of Knowledge:Fair  Language: Good  Akathisia:  NA  Handed:  Right  AIMS (if indicated):     Assets:  Desire for Improvement  Sleep:     Cognition: WNL  ADL's:  Intact      Has this patient used any form of tobacco in the last 30 days? (Cigarettes, Smokeless Tobacco, Cigars, and/or Pipes) N/A  Mental Status Per Nursing Assessment::   On Admission:     Current Mental Status by Physician: Denies SI BUT STATES SHE MIGHT DO SOMETHING IF SHE IS DISCHATRGED  Loss Factors: NA  Historical Factors: Prior suicide attempts and Unable to state when and what she did for suicide in the past.  Risk Reduction Factors:   Religious beliefs about death and Positive social support  Continued Clinical Symptoms:  Bipolar Disorder:   Depressive phase Depression:   Comorbid alcohol abuse/dependence Alcohol/Substance Abuse/Dependencies  Cognitive Features That Contribute To  Risk:  Closed-mindedness and Polarized thinking    Suicide Risk:  Minimal: No identifiable suicidal ideation.  Patients presenting with no risk factors but with morbid ruminations; may be classified as minimal risk based on the severity of the depressive symptoms  Principal Problem: Alcohol dependence Discharge Diagnoses:  Patient Active Problem List   Diagnosis Date Noted  . Alcohol dependence [F10.20] 07/17/2013    Priority: High  . Opioid dependence [F11.20] 05/06/2014  . Cocaine dependence [F14.20] 07/17/2013  . Thrombocytopenia [D69.6] 01/06/2013  . Bipolar 1 disorder [F31.9]   . Sarcoidosis [D86.9] 03/30/2010  . HYPERTENSION, BENIGN ESSENTIAL [I10] 01/11/2010  . HYPOTHYROIDISM [E03.9] 03/02/2009  . ALCOHOLIC LIVER DISEASE [M38.4] 03/06/2008  . DISORDER, BIPOLAR NOS [F31.9] 08/13/2007  . CIRRHOSIS, ALCOHOLIC, LIVER [Y65.99] 35/70/1779  . TOBACCO DEPENDENCE [Z72.0] 02/22/2007  . EXTERNAL HEMORRHOIDS [K64.8] 01/11/2005  . PORTAL HYPERTENSION [K76.6] 01/11/2005      Plan Of Care/Follow-up recommendations:  Activity:  as tolerated Diet:  regular  Is patient on multiple antipsychotic therapies at discharge:  No   Has Patient had three or more failed trials of antipsychotic monotherapy by history:  No  Recommended Plan for Multiple Antipsychotic Therapies: NA    Charmaine Downs, C   PMHNP-BC 02/04/2015, 4:12 PM

## 2015-02-04 NOTE — Discharge Instructions (Signed)
To help you maintain a sober lifestyle, a substance abuse treatment program may be helpful to you.  Contact one of the following facilities to see about enrolling in their program:  RESIDENTIAL PROGRAMS:       [facility]      [place], [place] [address]      [phone]       [facility]      [address] [place], [place] [address]      [phone]       [facility]      [address]      [place], [place] [address]      [phone]      This provider may require a co-payment  OUTPATIENT PROGRAMS:       [facility]      [address]      [place], [place] [address]      [phone]      Contact Brandon Melnick, LCAS to see about enrolling in the Chemical Dependency Intensive Outpatient Program (CD-IOP)       [facility]      [place], [place] [address]      [phone]       [facility]      [address], [employer]. [place], [place] [address]      [phone]

## 2015-02-04 NOTE — ED Notes (Signed)
Pt is a 49 year old voluntary admit who was discharged from the [facility] two hours ago. Pt presented back to the [facility] stating she felt SI and could not contract for safety. Pt stated if she went home she would drink herself to death. Pt admitted she has been drinking since the age of 57 and has had a few years of sobriety. Pt stated she is estranged from her family due to her drinking anf usage of cocaine. Pt is pleasant and cooperative. She was brought into room 34 and offered a meal.

## 2015-02-04 NOTE — ED Notes (Addendum)
Pt belongings in lockwer 32, 2 bags

## 2015-02-04 NOTE — ED Provider Notes (Signed)
CSN: [id number]     Arrival date & time 02/04/15  0309 History   First MD Initiated Contact with Patient 02/04/15 [phone]     Chief Complaint  Patient presents with  . Depression  . Alcohol Intoxication     (Consider location/radiation/quality/duration/timing/severity/associated sxs/prior Treatment) HPI Comments: The patient is a 49 year old female with a history of significant alcohol abuse. She is also using crack cocaine, multiple different pills and presented intoxicated on evaluation. She states that she has been in alcohol detox multiple times in the past but in the last month and a half she has started drinking again after 8 months of sobriety secondary to her life situation and family stressors. There have been multiple people in the family with alcoholism and a brother whom she found dead in a hotel after suicide. She denies suicidal thoughts or self, she states that she is not hallucinating, she does feel significantly depressed and requests help with her substance abuse.  Patient is a 49 y.o. female presenting with intoxication. The history is provided by the patient.  Alcohol Intoxication    Past Medical History  Diagnosis Date  . Hypothyroidism   . Blood transfusion July 2012  . Depression   . Anxiety   . Bipolar 1 disorder   . Menorrhagia   . Liver failure   . Bronchitis   . Cirrhosis of liver   . Wegener's granulomatosis   . History of sarcoidosis    Past Surgical History  Procedure Laterality Date  . Tubal ligation    . Laparoscopy    . Orthopedic surgery    . Cholecystectomy    . Fracture surgery    . Hernia repair    . Vaginal hysterectomy  07/27/2011    Procedure: HYSTERECTOMY VAGINAL;  Surgeon: [name], MD;  Location: [facility];  Service: Gynecology;  Laterality: N/A;  Vaginal Hysterectomy with Right Oophorectomy  . Laparotomy  07/28/2011    Procedure: EXPLORATORY LAPAROTOMY;  Surgeon: [name], MD;  Location: [facility];  Service: Gynecology;  Laterality:  N/A;   Family History  Problem Relation Age of Onset  . Diabetes Mother   . Hypertension Mother   . Diabetes Daughter   . Hypertension Daughter    History  Substance Use Topics  . Smoking status: Current Some Day Smoker -- 0.25 packs/day  . Smokeless tobacco: Never Used  . Alcohol Use: Yes     Comment: a fifth of vodka and a 12 pk a day of beer    OB History    Gravida Para Term Preterm AB TAB SAB Ectopic Multiple Living   4 3 3  0 1     3     Review of Systems  All other systems reviewed and are negative.     Allergies  Fluoxetine hcl; Metoclopramide hcl; and Morphine and related  Home Medications   Prior to Admission medications   Medication Sig Start Date End Date Taking? Authorizing Provider  buPROPion (WELLBUTRIN SR) 150 MG 12 hr tablet Take 150 mg by mouth daily. 01/05/15  Yes Historical Provider, MD  carisoprodol (SOMA) 350 MG tablet Take 350 mg by mouth 2 (two) times daily.   Yes Historical Provider, MD  clonazePAM (KLONOPIN) 0.5 MG tablet Take 0.5 mg by mouth 2 (two) times daily as needed for anxiety (anxiety).   Yes Historical Provider, MD  DULoxetine (CYMBALTA) 60 MG capsule Take 60 mg by mouth 2 (two) times daily. 01/05/15  Yes Historical Provider, MD  QUEtiapine (SEROQUEL) 400  MG tablet Take 400 mg by mouth at bedtime.   Yes Historical Provider, MD  zolpidem (AMBIEN) 5 MG tablet Take 5 mg by mouth at bedtime as needed for sleep (sleep).   Yes Historical Provider, MD  albuterol (PROVENTIL HFA;VENTOLIN HFA) 108 (90 BASE) MCG/ACT inhaler Inhale 1-2 puffs into the lungs every 6 (six) hours as needed for wheezing. 05/16/14 05/16/15  Encarnacion Slates, NP  docusate sodium (COLACE) 100 MG capsule Take 1 capsule (100 mg total) by mouth 2 (two) times daily. (This is an over the counter medicine): For constipation Patient not taking: Reported on 02/04/2015 07/29/14   Antonietta Breach, PA-C  DULoxetine (CYMBALTA) 30 MG capsule Take 1 capsule (30 mg total) by mouth daily. For  depression Patient not taking: Reported on 02/04/2015 05/16/14   Encarnacion Slates, NP  gabapentin (NEURONTIN) 300 MG capsule Take 1 capsule (300 mg total) by mouth 3 (three) times daily. For substance withdrawal syndrome Patient not taking: Reported on 02/04/2015 05/16/14   Encarnacion Slates, NP  lithium carbonate 300 MG capsule Take 1 capsule (300 mg total) by mouth 2 (two) times daily with a meal. For mood stabilization Patient not taking: Reported on 02/04/2015 05/16/14   Encarnacion Slates, NP  OLANZapine (ZYPREXA) 5 MG tablet Take 1 tablet (5 mg total) by mouth 2 (two) times daily. For mood control Patient not taking: Reported on 02/04/2015 05/16/14   Encarnacion Slates, NP  polyethylene glycol powder (GLYCOLAX/MIRALAX) powder Take 17 g by mouth daily. Until daily soft stools  OTC Patient not taking: Reported on 02/04/2015 07/29/14   Antonietta Breach, PA-C  promethazine (PHENERGAN) 25 MG tablet Take 1 tablet (25 mg total) by mouth every 6 (six) hours as needed for nausea or vomiting. Patient not taking: Reported on 02/04/2015 07/29/14   Antonietta Breach, PA-C  propranolol (INDERAL) 20 MG tablet Take 1 tablet (20 mg total) by mouth 2 (two) times daily. For anxiety Patient not taking: Reported on 02/04/2015 05/16/14   Encarnacion Slates, NP  thyroid (ARMOUR) 30 MG tablet Take 1 tablet (30 mg total) by mouth daily before breakfast. For low thyroid function Patient not taking: Reported on 02/04/2015 05/16/14   Encarnacion Slates, NP  traZODone (DESYREL) 100 MG tablet Take 1 tablet (100 mg total) by mouth at bedtime and may repeat dose one time if needed. For sleep Patient not taking: Reported on 02/04/2015 05/16/14   Encarnacion Slates, NP   BP 118/83 mmHg  Pulse 102  Temp(Src) 98.3 F (36.8 C) (Oral)  Resp 18  SpO2 98% Physical Exam  Constitutional: She appears well-developed and well-nourished. No distress.  HENT:  Head: Normocephalic and atraumatic.  Mouth/Throat: Oropharynx is clear and moist. No oropharyngeal exudate.  Eyes: Conjunctivae  and EOM are normal. Pupils are equal, round, and reactive to light. Right eye exhibits no discharge. Left eye exhibits no discharge. No scleral icterus.  Neck: Normal range of motion. Neck supple. No JVD present. No thyromegaly present.  Cardiovascular: Normal rate, regular rhythm, normal heart sounds and intact distal pulses.  Exam reveals no gallop and no friction rub.   No murmur heard. Pulmonary/Chest: Effort normal and breath sounds normal. No respiratory distress. She has no wheezes. She has no rales.  Abdominal: Soft. Bowel sounds are normal. She exhibits no distension and no mass. There is no tenderness.  Musculoskeletal: Normal range of motion. She exhibits no edema or tenderness.  Lymphadenopathy:    She has no cervical adenopathy.  Neurological: She is  alert. Coordination normal.  Skin: Skin is warm and dry. No rash noted. No erythema.  Psychiatric: Her behavior is normal.  Affect is mildly depressed and tearful  Nursing note and vitals reviewed.   ED Course  Procedures (including critical care time) Labs Review Labs Reviewed  ACETAMINOPHEN LEVEL - Abnormal; Notable for the following:    Acetaminophen (Tylenol), Serum <10.0 (*)    All other components within normal limits  CBC - Abnormal; Notable for the following:    WBC 2.5 (*)    Platelets 75 (*)    All other components within normal limits  COMPREHENSIVE METABOLIC PANEL - Abnormal; Notable for the following:    Potassium 3.4 (*)    Glucose, Bld 138 (*)    AST 61 (*)    All other components within normal limits  ETHANOL - Abnormal; Notable for the following:    Alcohol, Ethyl (B) 190 (*)    All other components within normal limits  URINE RAPID DRUG SCREEN (HOSP PERFORMED) - Abnormal; Notable for the following:    Cocaine POSITIVE (*)    Benzodiazepines POSITIVE (*)    All other components within normal limits  SALICYLATE LEVEL    Imaging Review No results found.   EKG Interpretation None      MDM    Final diagnoses:  None    Pt has significant substance abuse and is wanting off of ETOH - she reports significant sx with abrupt ETOH cessation in the past aincluding possibly DT's.  She will need evaluation for placement.  She denies SI to me    Johnna Acosta, MD 02/05/15 [phone]

## 2015-02-04 NOTE — ED Notes (Signed)
Pt went to discharge window from being discharged from psych ED and told them that she wanted to check back in because "they didn't help her".  Pt now states she is suicidal.

## 2015-02-04 NOTE — ED Notes (Signed)
Patient upset because she want's to transfer to [facility] for further treatment.  The plan is for patient to follow up with [facility] upon discharge.  Her CIWA is a 6 and she has been medicated with ativan 1 mg.  She exhibits tremors, shakiness, aches and sweating.  Will continue to assess.

## 2015-02-04 NOTE — ED Notes (Signed)
Pt states that she needs evaluation for depression and that she has been drinking heavily since January. Pt is unable to state how much she has had to drink tonight. Pt denies SI/HI.

## 2015-02-04 NOTE — ED Notes (Signed)
Pt AAO x 3, no distress noted, eating supper at present,  Will continue to monitor for safety.

## 2015-02-04 NOTE — BH Assessment (Signed)
Inpt recommended. Pt declined [facility] by Reginold Agent NP. Sent referrals to: [place], [place] Triage Specialist 02/04/2015 7:48 PM

## 2015-02-04 NOTE — Progress Notes (Addendum)
Report from [facility]. Pt is for discharge and states she can not promise once she gets home she will not hurt herself. Pt does appear disheleved. Spoke to NP. Pt stated she is going to check back into the ER after she is discharged from here.

## 2015-02-05 DIAGNOSIS — F419 Anxiety disorder, unspecified: Secondary | ICD-10-CM

## 2015-02-05 DIAGNOSIS — F329 Major depressive disorder, single episode, unspecified: Secondary | ICD-10-CM

## 2015-02-05 DIAGNOSIS — R45851 Suicidal ideations: Secondary | ICD-10-CM | POA: Diagnosis not present

## 2015-02-05 DIAGNOSIS — F332 Major depressive disorder, recurrent severe without psychotic features: Secondary | ICD-10-CM | POA: Insufficient documentation

## 2015-02-05 DIAGNOSIS — F1410 Cocaine abuse, uncomplicated: Secondary | ICD-10-CM | POA: Diagnosis not present

## 2015-02-05 NOTE — ED Notes (Signed)
Pt visiting with family at present, AAO x 3, no distress noted, will continue to monitor for safety.

## 2015-02-05 NOTE — ED Notes (Signed)
Pt says she feels her UDS doesn't reflect her recent drug use -- she says she was smoking marijuana PTA.

## 2015-02-05 NOTE — ED Notes (Signed)
Pt has been cooperative and appropriate during shift. She contracts while on unit but says she will harm self if discharged. She has been anxious and desiring inpatient treatment for ETOH detox. Pt endorses some detox symptoms -- CIWA of 7. PRN Klonopin given for anxiety with results pending. Will continue to monitor for needs/safety.

## 2015-02-05 NOTE — Consult Note (Signed)
[facility] Consult   Reason for Consult:  Alcohol USE DISORDER, SEVERE Referring Physician: EDP Patient Identification: Erin [name] MRN:  [id number] Principal Diagnosis: <principal problem not specified> Diagnosis:   Patient Active Problem List   Diagnosis Date Noted  . Alcohol dependence [F10.20] 07/17/2013    Priority: High  . Opioid dependence [F11.20] 05/06/2014  . Cocaine dependence [F14.20] 07/17/2013  . Thrombocytopenia [D69.6] 01/06/2013  . Bipolar 1 disorder [F31.9]   . Sarcoidosis [D86.9] 03/30/2010  . HYPERTENSION, BENIGN ESSENTIAL [I10] 01/11/2010  . HYPOTHYROIDISM [E03.9] 03/02/2009  . ALCOHOLIC LIVER DISEASE [L97.6] 03/06/2008  . DISORDER, BIPOLAR NOS [F31.9] 08/13/2007  . CIRRHOSIS, ALCOHOLIC, LIVER [B34.19] 37/90/2409  . TOBACCO DEPENDENCE [Z72.0] 02/22/2007  . EXTERNAL HEMORRHOIDS [K64.8] 01/11/2005  . PORTAL HYPERTENSION [K76.6] 01/11/2005    Total Time spent with patient: 45 minutes  Subjective:   Erin [name] is a 49 y.o. female patient admitted with Alcohol dependence.  HPI:   Patient was discharged yesterday and came back to the ER  Immediately without even leaving the facility because she did not feel safe going home.  Patient reported that she has been off her medications for a while and that she needed inpatient treatment in other  to get back on her medications.  Patient is now endorsing suicide and stated that she will hurt herself if let go home.  Patient Caucasian female, 49 years old was seen yesterday for Alcohol dependence.  Patient reported that she binges on Alcohol and that she was clean for a year.  Patient reports that she relapsed few months ago and that she does not drink daily.  Patient reports that she was hospitalized in [facility] two years ago.  Patient was unable to quantify her use of Alcohol, Cocaine and Marijuana use.  Patient's UDS and Alcohol came back negative.  Patient has not been compliant with her medications  and has not been seeing her outpatient Psychiatrist.   She reports a MH hx of Bipolar disorder and Schizophrenia. Patient denies HI/AVH.  We will seek placement at any facility with available bed for inpatient Psychiatric admission.  Her medications are restarted as we wait for bed.    HPI Elements:   Location:  Alcohol use disorder,. Quality:  Moderate. Severity:  moderate. Timing:  Acute. Duration:  Chronic mental illness, substance abuse. Context:  Seeking detox treatment..  Past Medical History:  Past Medical History  Diagnosis Date  . Hypothyroidism   . Blood transfusion July 2012  . Depression   . Anxiety   . Bipolar 1 disorder   . Menorrhagia   . Liver failure   . Bronchitis   . Cirrhosis of liver   . Wegener's granulomatosis   . History of sarcoidosis     Past Surgical History  Procedure Laterality Date  . Tubal ligation    . Laparoscopy    . Orthopedic surgery    . Cholecystectomy    . Fracture surgery    . Hernia repair    . Vaginal hysterectomy  07/27/2011    Procedure: HYSTERECTOMY VAGINAL;  Surgeon: [name], MD;  Location: [facility];  Service: Gynecology;  Laterality: N/A;  Vaginal Hysterectomy with Right Oophorectomy  . Laparotomy  07/28/2011    Procedure: EXPLORATORY LAPAROTOMY;  Surgeon: [name], MD;  Location: [facility];  Service: Gynecology;  Laterality: N/A;   Family History:  Family History  Problem Relation Age of Onset  . Diabetes Mother   . Hypertension Mother   . Diabetes  Daughter   . Hypertension Daughter    Social History:  History  Alcohol Use  . Yes    Comment: a fifth of vodka and a 12 pk a day of beer      History  Drug Use  . Yes  . Special: Cocaine, Marijuana    Comment: opioids, benzos    History   Social History  . Marital Status: Divorced    Spouse Name: N/A  . Number of Children: N/A  . Years of Education: N/A   Social History Main Topics  . Smoking status: Current Some Day Smoker -- 0.25 packs/day  . Smokeless  tobacco: Never Used  . Alcohol Use: Yes     Comment: a fifth of vodka and a 12 pk a day of beer   . Drug Use: Yes    Special: Cocaine, Marijuana     Comment: opioids, benzos  . Sexual Activity: No   Other Topics Concern  . None   Social History Narrative   Additional Social History:   Allergies:   Allergies  Allergen Reactions  . Fluoxetine Hcl Other (See Comments)     hyperactivity  . Metoclopramide Hcl Other (See Comments)    depression  . Morphine And Related Other (See Comments)    High doses cause hallucinations    Vitals: Blood pressure 139/95, pulse 81, temperature 97.7 F (36.5 C), temperature source Oral, resp. rate 18, SpO2 98 %.  Risk to Self:   Risk to Others:   Prior Inpatient Therapy:   Prior Outpatient Therapy:    Current Facility-Administered Medications  Medication Dose Route Frequency Provider Last Rate Last Dose  . albuterol (PROVENTIL HFA;VENTOLIN HFA) 108 (90 BASE) MCG/ACT inhaler 1-2 puff  1-2 puff Inhalation Q6H PRN Debby Freiberg, MD      . buPROPion [facility] SR) 12 hr tablet 150 mg  150 mg Oral Daily Debby Freiberg, MD   150 mg at 02/05/15 0300  . carisoprodol (SOMA) tablet 350 mg  350 mg Oral BID Debby Freiberg, MD   350 mg at 02/05/15 9233  . clonazePAM (KLONOPIN) tablet 0.5 mg  0.5 mg Oral BID PRN Debby Freiberg, MD      . DULoxetine (CYMBALTA) DR capsule 60 mg  60 mg Oral BID Debby Freiberg, MD   60 mg at 02/05/15 0076  . QUEtiapine (SEROQUEL) tablet 400 mg  400 mg Oral QHS Debby Freiberg, MD   400 mg at 02/04/15 2231  . zolpidem (AMBIEN) tablet 5 mg  5 mg Oral QHS PRN Debby Freiberg, MD       Current Outpatient Prescriptions  Medication Sig Dispense Refill  . albuterol (PROVENTIL HFA;VENTOLIN HFA) 108 (90 BASE) MCG/ACT inhaler Inhale 1-2 puffs into the lungs every 6 (six) hours as needed for wheezing.    Marland Kitchen buPROPion (WELLBUTRIN SR) 150 MG 12 hr tablet Take 150 mg by mouth daily.  2  . carisoprodol (SOMA) 350 MG tablet Take 350 mg by  mouth 2 (two) times daily.    . clonazePAM (KLONOPIN) 0.5 MG tablet Take 0.5 mg by mouth 2 (two) times daily as needed for anxiety (anxiety).    . DULoxetine (CYMBALTA) 60 MG capsule Take 60 mg by mouth 2 (two) times daily.  2  . QUEtiapine (SEROQUEL) 400 MG tablet Take 400 mg by mouth at bedtime.    Marland Kitchen zolpidem (AMBIEN) 5 MG tablet Take 5 mg by mouth at bedtime as needed for sleep (sleep).    . docusate sodium (COLACE) 100 MG capsule  Take 1 capsule (100 mg total) by mouth 2 (two) times daily. (This is an over the counter medicine): For constipation (Patient not taking: Reported on 02/04/2015) 10 capsule 0  . gabapentin (NEURONTIN) 300 MG capsule Take 1 capsule (300 mg total) by mouth 3 (three) times daily. For substance withdrawal syndrome (Patient not taking: Reported on 02/04/2015) 90 capsule 0  . lithium carbonate 300 MG capsule Take 1 capsule (300 mg total) by mouth 2 (two) times daily with a meal. For mood stabilization (Patient not taking: Reported on 02/04/2015) 60 capsule 0  . OLANZapine (ZYPREXA) 5 MG tablet Take 1 tablet (5 mg total) by mouth 2 (two) times daily. For mood control (Patient not taking: Reported on 02/04/2015) 60 tablet 0  . polyethylene glycol powder (GLYCOLAX/MIRALAX) powder Take 17 g by mouth daily. Until daily soft stools  OTC (Patient not taking: Reported on 02/04/2015) 255 g 0  . propranolol (INDERAL) 20 MG tablet Take 1 tablet (20 mg total) by mouth 2 (two) times daily. For anxiety (Patient not taking: Reported on 02/04/2015) 60 tablet 0  . thyroid (ARMOUR) 30 MG tablet Take 1 tablet (30 mg total) by mouth daily before breakfast. For low thyroid function (Patient not taking: Reported on 02/04/2015) 30 tablet 0  . traZODone (DESYREL) 100 MG tablet Take 1 tablet (100 mg total) by mouth at bedtime and may repeat dose one time if needed. For sleep (Patient not taking: Reported on 02/04/2015) 60 tablet 0    Musculoskeletal: Strength & Muscle Tone: within normal limits Gait &  Station: normal Patient leans: N/A  Psychiatric Specialty Exam:     Blood pressure 139/95, pulse 81, temperature 97.7 F (36.5 C), temperature source Oral, resp. rate 18, SpO2 98 %.There is no weight on file to calculate BMI.  General Appearance: Casual  Eye Contact::  Fair  Speech:  Clear and Coherent and Normal Rate  Volume:  Normal  Mood:  Anxious and Depressed  Affect:  Congruent  Thought Process:  Coherent, Goal Directed and Intact  Orientation:  Full (Time, Place, and Person)  Thought Content:  WDL  Suicidal Thoughts:  Yes.  with intent/plan  Homicidal Thoughts:  No  Memory:  Immediate;   Good Recent;   Good Remote;   Fair  Judgement:  Fair  Insight:  Shallow  Psychomotor Activity:  Normal  Concentration:  Good  Recall:  NA  Fund of Knowledge:Fair  Language: Good  Akathisia:  NA  Handed:  Right  AIMS (if indicated):     Assets:  Desire for Improvement  ADL's:  Intact  Cognition: WNL  Sleep:      Medical Decision Making: Established Problem, Worsening (2)  Treatment Plan Summary: Admit to inpatient Psychiatric unit  Plan:  Recommend psychiatric Inpatient admission when medically cleared. Patient reports she does not feel safe going home without inpatient treatment  Disposition: Admit to inpatient for safety and stabilization  Delfin Gant   PMHNP-BC 02/05/2015 12:57 PM  Patient seen, evaluated and I agree with notes by Nurse Practitioner. Corena Pilgrim, MD

## 2015-02-05 NOTE — Progress Notes (Signed)
Noted no pcp for pt  Reviewed EPIC  Follow-up With Details Why Contact Info Elwyn Reach This is your medicaid assigned primary care provider If this is not your preferred provider Please call [facility] [phone] to get assist with changing [place]. [place] [phone] Pt updated

## 2015-02-06 ENCOUNTER — Inpatient Hospital Stay (HOSPITAL_COMMUNITY)
Admission: AD | Admit: 2015-02-06 | Discharge: 2015-02-17 | DRG: 885 | Disposition: A | Discharge status: Home / Self Care | Payer: Medicare Other | Source: Intra-hospital | Attending: Psychiatry | Admitting: Psychiatry

## 2015-02-06 ENCOUNTER — Encounter (HOSPITAL_COMMUNITY): Payer: Self-pay | Admitting: *Deleted

## 2015-02-06 DIAGNOSIS — E039 Hypothyroidism, unspecified: Secondary | ICD-10-CM | POA: Diagnosis present

## 2015-02-06 DIAGNOSIS — Z833 Family history of diabetes mellitus: Secondary | ICD-10-CM

## 2015-02-06 DIAGNOSIS — F419 Anxiety disorder, unspecified: Secondary | ICD-10-CM | POA: Diagnosis present

## 2015-02-06 DIAGNOSIS — Z87891 Personal history of nicotine dependence: Secondary | ICD-10-CM | POA: Diagnosis not present

## 2015-02-06 DIAGNOSIS — F1020 Alcohol dependence, uncomplicated: Secondary | ICD-10-CM | POA: Diagnosis present

## 2015-02-06 DIAGNOSIS — Z8249 Family history of ischemic heart disease and other diseases of the circulatory system: Secondary | ICD-10-CM | POA: Diagnosis not present

## 2015-02-06 DIAGNOSIS — F1410 Cocaine abuse, uncomplicated: Secondary | ICD-10-CM | POA: Diagnosis not present

## 2015-02-06 DIAGNOSIS — F329 Major depressive disorder, single episode, unspecified: Secondary | ICD-10-CM | POA: Diagnosis not present

## 2015-02-06 DIAGNOSIS — R569 Unspecified convulsions: Secondary | ICD-10-CM | POA: Diagnosis present

## 2015-02-06 DIAGNOSIS — D869 Sarcoidosis, unspecified: Secondary | ICD-10-CM | POA: Diagnosis present

## 2015-02-06 DIAGNOSIS — G4700 Insomnia, unspecified: Secondary | ICD-10-CM | POA: Diagnosis present

## 2015-02-06 DIAGNOSIS — F310 Bipolar disorder, current episode hypomanic: Secondary | ICD-10-CM | POA: Diagnosis not present

## 2015-02-06 DIAGNOSIS — I10 Essential (primary) hypertension: Secondary | ICD-10-CM | POA: Diagnosis present

## 2015-02-06 DIAGNOSIS — R45851 Suicidal ideations: Secondary | ICD-10-CM

## 2015-02-06 DIAGNOSIS — F314 Bipolar disorder, current episode depressed, severe, without psychotic features: Secondary | ICD-10-CM | POA: Diagnosis present

## 2015-02-06 DIAGNOSIS — F1024 Alcohol dependence with alcohol-induced mood disorder: Secondary | ICD-10-CM | POA: Diagnosis present

## 2015-02-06 DIAGNOSIS — F1420 Cocaine dependence, uncomplicated: Secondary | ICD-10-CM | POA: Diagnosis not present

## 2015-02-06 DIAGNOSIS — F319 Bipolar disorder, unspecified: Principal | ICD-10-CM | POA: Diagnosis present

## 2015-02-06 DIAGNOSIS — K7030 Alcoholic cirrhosis of liver without ascites: Secondary | ICD-10-CM | POA: Diagnosis present

## 2015-02-06 DIAGNOSIS — F1320 Sedative, hypnotic or anxiolytic dependence, uncomplicated: Secondary | ICD-10-CM | POA: Diagnosis not present

## 2015-02-06 DIAGNOSIS — K5900 Constipation, unspecified: Secondary | ICD-10-CM | POA: Diagnosis present

## 2015-02-06 DIAGNOSIS — F39 Unspecified mood [affective] disorder: Secondary | ICD-10-CM | POA: Diagnosis not present

## 2015-02-06 DIAGNOSIS — G8929 Other chronic pain: Secondary | ICD-10-CM | POA: Diagnosis present

## 2015-02-06 DIAGNOSIS — F1910 Other psychoactive substance abuse, uncomplicated: Secondary | ICD-10-CM | POA: Diagnosis not present

## 2015-02-06 DIAGNOSIS — F1010 Alcohol abuse, uncomplicated: Secondary | ICD-10-CM | POA: Diagnosis not present

## 2015-02-06 LAB — TSH: TSH: 1.529 u[IU]/mL (ref 0.350–4.500)

## 2015-02-06 MED ORDER — HYDROXYZINE HCL 25 MG PO TABS: 25.0000 mg | ORAL_TABLET | Freq: Four times a day (QID) | ORAL | Status: DC | PRN

## 2015-02-06 MED ORDER — ALBUTEROL SULFATE HFA 108 (90 BASE) MCG/ACT IN AERS
1.0000 | INHALATION_SPRAY | Freq: Four times a day (QID) | RESPIRATORY_TRACT | Status: DC | PRN
  Administered 2015-02-14 – 2015-02-16 (×5): 2 via RESPIRATORY_TRACT
  Filled 2015-02-06: qty 6.7

## 2015-02-06 MED ORDER — LORAZEPAM 1 MG PO TABS
1.0000 mg | ORAL_TABLET | Freq: Four times a day (QID) | ORAL | Status: DC | PRN
  Administered 2015-02-06: 1 mg via ORAL
  Filled 2015-02-06: qty 1

## 2015-02-06 MED ORDER — CARISOPRODOL 350 MG PO TABS
350.0000 mg | ORAL_TABLET | Freq: Two times a day (BID) | ORAL | Status: DC
  Administered 2015-02-06 – 2015-02-07 (×2): 350 mg via ORAL
  Filled 2015-02-06 (×2): qty 1

## 2015-02-06 MED ORDER — LORAZEPAM 1 MG PO TABS
1.0000 mg | ORAL_TABLET | Freq: Four times a day (QID) | ORAL | Status: DC | PRN
  Administered 2015-02-06 – 2015-02-07 (×3): 1 mg via ORAL
  Filled 2015-02-06 (×3): qty 1

## 2015-02-06 MED ORDER — PNEUMOCOCCAL VAC POLYVALENT 25 MCG/0.5ML IJ INJ
0.5000 mL | INJECTION | INTRAMUSCULAR | Status: AC
  Administered 2015-02-08: 0.5 mL via INTRAMUSCULAR

## 2015-02-06 MED ORDER — ALUM & MAG HYDROXIDE-SIMETH 200-200-20 MG/5ML PO SUSP
30.0000 mL | ORAL | Status: DC | PRN
  Administered 2015-02-14: 30 mL via ORAL
  Filled 2015-02-06: qty 30

## 2015-02-06 MED ORDER — ACETAMINOPHEN 325 MG PO TABS
650.0000 mg | ORAL_TABLET | Freq: Four times a day (QID) | ORAL | Status: DC | PRN
  Filled 2015-02-06: qty 2

## 2015-02-06 MED ORDER — PROPRANOLOL HCL 20 MG PO TABS
20.0000 mg | ORAL_TABLET | Freq: Two times a day (BID) | ORAL | Status: DC
  Administered 2015-02-06 – 2015-02-17 (×22): 20 mg via ORAL
  Filled 2015-02-06: qty 1
  Filled 2015-02-06: qty 8
  Filled 2015-02-06 (×14): qty 1
  Filled 2015-02-06: qty 2
  Filled 2015-02-06 (×2): qty 1
  Filled 2015-02-06: qty 8
  Filled 2015-02-06 (×2): qty 1
  Filled 2015-02-06: qty 2
  Filled 2015-02-06 (×5): qty 1

## 2015-02-06 MED ORDER — QUETIAPINE FUMARATE 400 MG PO TABS
400.0000 mg | ORAL_TABLET | Freq: Every day | ORAL | Status: DC
  Administered 2015-02-06: 400 mg via ORAL
  Filled 2015-02-06: qty 2
  Filled 2015-02-06 (×3): qty 1

## 2015-02-06 MED ORDER — THYROID 30 MG PO TABS
30.0000 mg | ORAL_TABLET | Freq: Every day | ORAL | Status: DC
  Filled 2015-02-06: qty 1

## 2015-02-06 MED ORDER — HYDROXYZINE HCL 25 MG PO TABS
25.0000 mg | ORAL_TABLET | Freq: Four times a day (QID) | ORAL | Status: DC | PRN
  Administered 2015-02-07: 25 mg via ORAL
  Filled 2015-02-06: qty 1

## 2015-02-06 MED ORDER — THYROID 30 MG PO TABS
30.0000 mg | ORAL_TABLET | Freq: Every day | ORAL | Status: DC
  Administered 2015-02-07 – 2015-02-17 (×11): 30 mg via ORAL
  Filled 2015-02-06 (×14): qty 1

## 2015-02-06 MED ORDER — PROPRANOLOL HCL 20 MG PO TABS
20.0000 mg | ORAL_TABLET | Freq: Two times a day (BID) | ORAL | Status: DC
  Filled 2015-02-06: qty 1

## 2015-02-06 MED ORDER — BUPROPION HCL ER (SR) 150 MG PO TB12
150.0000 mg | ORAL_TABLET | Freq: Every day | ORAL | Status: DC
  Administered 2015-02-07: 150 mg via ORAL
  Filled 2015-02-06 (×3): qty 1

## 2015-02-06 MED ORDER — DULOXETINE HCL 60 MG PO CPEP
60.0000 mg | ORAL_CAPSULE | Freq: Two times a day (BID) | ORAL | Status: DC
  Administered 2015-02-06 – 2015-02-17 (×22): 60 mg via ORAL
  Filled 2015-02-06 (×3): qty 1
  Filled 2015-02-06: qty 8
  Filled 2015-02-06 (×2): qty 1
  Filled 2015-02-06: qty 8
  Filled 2015-02-06 (×21): qty 1

## 2015-02-06 MED ORDER — INFLUENZA VAC SPLIT QUAD 0.5 ML IM SUSY
0.5000 mL | PREFILLED_SYRINGE | INTRAMUSCULAR | Status: AC
  Administered 2015-02-08: 0.5 mL via INTRAMUSCULAR
  Filled 2015-02-06: qty 0.5

## 2015-02-06 MED ORDER — MAGNESIUM HYDROXIDE 400 MG/5ML PO SUSP: 30.0000 mL | Freq: Every day | ORAL | Status: DC | PRN

## 2015-02-06 MED ORDER — ZOLPIDEM TARTRATE 5 MG PO TABS
5.0000 mg | ORAL_TABLET | Freq: Every evening | ORAL | Status: DC | PRN
  Administered 2015-02-07: 5 mg via ORAL
  Filled 2015-02-06: qty 1

## 2015-02-06 NOTE — Tx Team (Signed)
Initial Interdisciplinary Treatment Plan   PATIENT STRESSORS: Marital or family conflict Substance abuse   PATIENT STRENGTHS: Ability for insight Average or above average intelligence Communication skills General fund of knowledge Motivation for treatment/growth   PROBLEM LIST: Problem List/Patient Goals Date to be addressed Date deferred Reason deferred Estimated date of resolution  ETOH detox 02/06/15     Cocaine detox 02/06/15     depression 02/06/15                                          DISCHARGE CRITERIA:  Improved stabilization in mood, thinking, and/or behavior Verbal commitment to aftercare and medication compliance Withdrawal symptoms are absent or subacute and managed without 24-hour nursing intervention  PRELIMINARY DISCHARGE PLAN: Outpatient therapy Participate in family therapy Return to previous living arrangement  PATIENT/FAMIILY INVOLVEMENT: This treatment plan has been presented to and reviewed with the patient, Erin [name].  The patient and family have been given the opportunity to ask questions and make suggestions.  Judie Petit 02/06/2015, 9:49 PM

## 2015-02-06 NOTE — Consult Note (Signed)
[facility] Consult   Reason for Consult:  Alcohol detox, depression Referring Physician:  EDP Patient Identification: Erin [name] MRN:  [id number] Principal Diagnosis: <principal problem not specified> Diagnosis:   Patient Active Problem List   Diagnosis Date Noted  . Bipolar affective disorder, depressed, severe [F31.4] 02/06/2015    Priority: High  . Suicidal ideations [R45.851]     Priority: High  . Cocaine dependence [F14.20] 07/17/2013    Priority: High  . Opioid dependence [F11.20] 05/06/2014  . Thrombocytopenia [D69.6] 01/06/2013  . Sarcoidosis [D86.9] 03/30/2010  . HYPERTENSION, BENIGN ESSENTIAL [I10] 01/11/2010  . HYPOTHYROIDISM [E03.9] 03/02/2009  . ALCOHOLIC LIVER DISEASE [H82.9] 03/06/2008  . CIRRHOSIS, ALCOHOLIC, LIVER [H37.16] 96/78/9381  . TOBACCO DEPENDENCE [Z72.0] 02/22/2007  . EXTERNAL HEMORRHOIDS [K64.8] 01/11/2005  . PORTAL HYPERTENSION [K76.6] 01/11/2005    Total Time spent with patient: 30 minutes  Subjective:   Erin [name] is a 49 y.o. female patient admitted with alcohol detox, depression.  HPI:  The patient requests detox from alcohol, has been drinking daily and using cocaine; slight tremor today from withdrawal.  Patient also endorses depression with suicidal ideations.  She has a history of bipolar disorder and has had increasing depression since December due to the holidays and sudden loss of her brother.  Erin [name] reports many recent deaths in the family and her 61 yo mother with dementia, stressors.  Suicidal ideations with vague plan at this time.  She was living in a retirement home until 3 months ago and now lives in a house by herself. HPI Elements:   Location:  generalized. Quality:  acute. Severity:  severe . Timing:  constant. Duration:  since Christmas . Context:  stressors.  Past Medical History:  Past Medical History  Diagnosis Date  . Hypothyroidism   . Blood transfusion July 2012  . Depression   .  Anxiety   . Bipolar 1 disorder   . Menorrhagia   . Liver failure   . Bronchitis   . Cirrhosis of liver   . Wegener's granulomatosis   . History of sarcoidosis     Past Surgical History  Procedure Laterality Date  . Tubal ligation    . Laparoscopy    . Orthopedic surgery    . Cholecystectomy    . Fracture surgery    . Hernia repair    . Vaginal hysterectomy  07/27/2011    Procedure: HYSTERECTOMY VAGINAL;  Surgeon: [name], MD;  Location: [facility];  Service: Gynecology;  Laterality: N/A;  Vaginal Hysterectomy with Right Oophorectomy  . Laparotomy  07/28/2011    Procedure: EXPLORATORY LAPAROTOMY;  Surgeon: [name], MD;  Location: [facility];  Service: Gynecology;  Laterality: N/A;   Family History:  Family History  Problem Relation Age of Onset  . Diabetes Mother   . Hypertension Mother   . Diabetes Daughter   . Hypertension Daughter    Social History:  History  Alcohol Use  . Yes    Comment: a fifth of vodka and a 12 pk a day of beer      History  Drug Use  . Yes  . Special: Cocaine, Marijuana    Comment: opioids, benzos    History   Social History  . Marital Status: Divorced    Spouse Name: N/A  . Number of Children: N/A  . Years of Education: N/A   Social History Main Topics  . Smoking status: Current Some Day Smoker -- 0.25 packs/day  . Smokeless tobacco: Never Used  .  Alcohol Use: Yes     Comment: a fifth of vodka and a 12 pk a day of beer   . Drug Use: Yes    Special: Cocaine, Marijuana     Comment: opioids, benzos  . Sexual Activity: No   Other Topics Concern  . None   Social History Narrative   Additional Social History:                          Allergies:   Allergies  Allergen Reactions  . Fluoxetine Hcl Other (See Comments)     hyperactivity  . Metoclopramide Hcl Other (See Comments)    depression  . Morphine And Related Other (See Comments)    High doses cause hallucinations    Vitals: Blood pressure 155/82, pulse 73,  temperature 98.1 F (36.7 C), temperature source Oral, resp. rate 18, SpO2 97 %.  Risk to Self:   Risk to Others:   Prior Inpatient Therapy:   Prior Outpatient Therapy:    Current Facility-Administered Medications  Medication Dose Route Frequency Provider Last Rate Last Dose  . albuterol (PROVENTIL HFA;VENTOLIN HFA) 108 (90 BASE) MCG/ACT inhaler 1-2 puff  1-2 puff Inhalation Q6H PRN Debby Freiberg, MD      . buPROPion [facility] SR) 12 hr tablet 150 mg  150 mg Oral Daily Debby Freiberg, MD   150 mg at 02/06/15 1829  . carisoprodol (SOMA) tablet 350 mg  350 mg Oral BID Debby Freiberg, MD   350 mg at 02/06/15 9371  . clonazePAM (KLONOPIN) tablet 0.5 mg  0.5 mg Oral BID PRN Debby Freiberg, MD   0.5 mg at 02/06/15 0933  . DULoxetine (CYMBALTA) DR capsule 60 mg  60 mg Oral BID Debby Freiberg, MD   60 mg at 02/06/15 6967  . QUEtiapine (SEROQUEL) tablet 400 mg  400 mg Oral QHS Debby Freiberg, MD   400 mg at 02/05/15 2105  . zolpidem (AMBIEN) tablet 5 mg  5 mg Oral QHS PRN Debby Freiberg, MD   5 mg at 02/06/15 0127   Current Outpatient Prescriptions  Medication Sig Dispense Refill  . albuterol (PROVENTIL HFA;VENTOLIN HFA) 108 (90 BASE) MCG/ACT inhaler Inhale 1-2 puffs into the lungs every 6 (six) hours as needed for wheezing.    Marland Kitchen buPROPion (WELLBUTRIN SR) 150 MG 12 hr tablet Take 150 mg by mouth daily.  2  . carisoprodol (SOMA) 350 MG tablet Take 350 mg by mouth 2 (two) times daily.    . clonazePAM (KLONOPIN) 0.5 MG tablet Take 0.5 mg by mouth 2 (two) times daily as needed for anxiety (anxiety).    . DULoxetine (CYMBALTA) 60 MG capsule Take 60 mg by mouth 2 (two) times daily.  2  . QUEtiapine (SEROQUEL) 400 MG tablet Take 400 mg by mouth at bedtime.    Marland Kitchen zolpidem (AMBIEN) 5 MG tablet Take 5 mg by mouth at bedtime as needed for sleep (sleep).    . docusate sodium (COLACE) 100 MG capsule Take 1 capsule (100 mg total) by mouth 2 (two) times daily. (This is an over the counter medicine): For  constipation (Patient not taking: Reported on 02/04/2015) 10 capsule 0  . gabapentin (NEURONTIN) 300 MG capsule Take 1 capsule (300 mg total) by mouth 3 (three) times daily. For substance withdrawal syndrome (Patient not taking: Reported on 02/04/2015) 90 capsule 0  . lithium carbonate 300 MG capsule Take 1 capsule (300 mg total) by mouth 2 (two) times daily with a meal. For  mood stabilization (Patient not taking: Reported on 02/04/2015) 60 capsule 0  . OLANZapine (ZYPREXA) 5 MG tablet Take 1 tablet (5 mg total) by mouth 2 (two) times daily. For mood control (Patient not taking: Reported on 02/04/2015) 60 tablet 0  . polyethylene glycol powder (GLYCOLAX/MIRALAX) powder Take 17 g by mouth daily. Until daily soft stools  OTC (Patient not taking: Reported on 02/04/2015) 255 g 0  . propranolol (INDERAL) 20 MG tablet Take 1 tablet (20 mg total) by mouth 2 (two) times daily. For anxiety (Patient not taking: Reported on 02/04/2015) 60 tablet 0  . thyroid (ARMOUR) 30 MG tablet Take 1 tablet (30 mg total) by mouth daily before breakfast. For low thyroid function (Patient not taking: Reported on 02/04/2015) 30 tablet 0  . traZODone (DESYREL) 100 MG tablet Take 1 tablet (100 mg total) by mouth at bedtime and may repeat dose one time if needed. For sleep (Patient not taking: Reported on 02/04/2015) 60 tablet 0    Musculoskeletal: Strength & Muscle Tone: within normal limits Gait & Station: normal Patient leans: N/A  Psychiatric Specialty Exam:     Blood pressure 155/82, pulse 73, temperature 98.1 F (36.7 C), temperature source Oral, resp. rate 18, SpO2 97 %.There is no weight on file to calculate BMI.  General Appearance: Disheveled  Eye Sport and exercise psychologist::  Fair  Speech:  Normal Rate  Volume:  Normal  Mood:  Depressed  Affect:  Congruent  Thought Process:  Coherent  Orientation:  Full (Time, Place, and Person)  Thought Content:  Rumination  Suicidal Thoughts:  Yes.  with intent/plan  Homicidal Thoughts:  No   Memory:  Immediate;   Fair Recent;   Fair Remote;   Fair  Judgement:  Fair  Insight:  Fair  Psychomotor Activity:  Decreased  Concentration:  Fair  Recall:  [employer] of Knowledge:Fair  Language: Fair  Akathisia:  No  Handed:  Right  AIMS (if indicated):     Assets:  Housing Leisure Time Resilience  ADL's:  Intact  Cognition: WNL  Sleep:      Medical Decision Making: New problem, with additional work up planned, Review of Psycho-Social Stressors (1) and Review of Medication Regimen & Side Effects (2)  Treatment Plan Summary: Daily contact with patient to assess and evaluate symptoms and progress in treatment, Medication management and Plan admit to inpatient psychiatry for stabilization  Plan:  Recommend psychiatric Inpatient admission when medically cleared. Disposition: Admit to inpatient psychiatry for stabilization  Waylan Boga, PMH-NP 02/06/2015 1:55 PM  Patient seen, evaluated and I agree with notes by Nurse Practitioner. Corena Pilgrim, MD

## 2015-02-06 NOTE — ED Notes (Signed)
Report received from Kerby Nora RN. Pt. Alert and oriented in no distress denies SI, HI, AVH and pain.  Pt. Instructed to come to me with problems or concerns.Will continue to monitor for safety via security cameras and Q 15 minute checks.

## 2015-02-06 NOTE — Progress Notes (Signed)
Patient ID: Erin [name], female   DOB: [birth year], 49 y.o.   MRN: [id number] Pt reported she had not taken any PTA medications in a couple of months.

## 2015-02-06 NOTE — ED Notes (Signed)
Pt pleased to learn she will be transferred to [facility]. Pt is to transfer after 1930.

## 2015-02-06 NOTE — Progress Notes (Signed)
Patient ID: Erin [name], female   DOB: [birth year], 49 y.o.   MRN: [id number] Pt admitted voluntarily to [facility] for detox from ETOH and cocaine. Pt verbalized she has been using daily since Thanksgiving. Pt reported her longest length of sobriety has been 2.5 years. Pt stated she has lost several family members including a brother to cancer 1.5 years ago. Pt reported she tried to have a Thanksgiving dinner and no one showed up which caused her to become depressed and start drinking heavily again. Pt reported motivation for treatment is to be able to continue a relationship with her boyfriend of 1.5 years. Pt denied having a good support system. Pt denied SI, HI and AVH during admission. Fifteen minute checks initiated. Pt safe on unit.

## 2015-02-06 NOTE — Progress Notes (Signed)
Patient accepted to [facility] 306-2.  Clayborne Dana, RN

## 2015-02-06 NOTE — ED Notes (Signed)
Pt asked when her next meds were due. When this writer asked which ones, pt responded "any." She said she thought she was going to be prescribed Ativan. She also mentioned that she takes Phenergan at home and can't eat much at one time with it. Told her that no medications have been changed. Will continue to monitor for needs/safety.

## 2015-02-06 NOTE — ED Notes (Signed)
Erin [name] is pleasant upon approach. She denies SI/HI/AVH (answering "not at the moment") to each. She asks about Klonopin and says she was too anxious to eat when breakfast arrived. Suggested she postpone Klonopin, but she felt she needed it now. PRN given as ordered. She feels she needs detox treatment and seemed to have hope that maybe now she could go to [facility], although she has previously been told options are being sought for placement elsewhere. Will continue to monitor for needs/safety.

## 2015-02-07 DIAGNOSIS — F1410 Cocaine abuse, uncomplicated: Secondary | ICD-10-CM

## 2015-02-07 DIAGNOSIS — F310 Bipolar disorder, current episode hypomanic: Secondary | ICD-10-CM

## 2015-02-07 DIAGNOSIS — F1024 Alcohol dependence with alcohol-induced mood disorder: Secondary | ICD-10-CM

## 2015-02-07 DIAGNOSIS — F39 Unspecified mood [affective] disorder: Secondary | ICD-10-CM

## 2015-02-07 MED ORDER — LORAZEPAM 1 MG PO TABS
1.0000 mg | ORAL_TABLET | Freq: Two times a day (BID) | ORAL | Status: DC
  Administered 2015-02-09: 1 mg via ORAL
  Filled 2015-02-07: qty 1

## 2015-02-07 MED ORDER — LOPERAMIDE HCL 2 MG PO CAPS
2.0000 mg | ORAL_CAPSULE | ORAL | Status: AC | PRN
  Filled 2015-02-07: qty 1

## 2015-02-07 MED ORDER — THIAMINE HCL 100 MG/ML IJ SOLN: 100.0000 mg | Freq: Once | INTRAMUSCULAR | Status: DC

## 2015-02-07 MED ORDER — LORAZEPAM 1 MG PO TABS
1.0000 mg | ORAL_TABLET | Freq: Four times a day (QID) | ORAL | Status: AC
  Administered 2015-02-07 – 2015-02-08 (×4): 1 mg via ORAL
  Filled 2015-02-07 (×4): qty 1

## 2015-02-07 MED ORDER — CARISOPRODOL 350 MG PO TABS
350.0000 mg | ORAL_TABLET | Freq: Every morning | ORAL | Status: DC
  Administered 2015-02-08 – 2015-02-09 (×2): 350 mg via ORAL
  Filled 2015-02-07 (×2): qty 1

## 2015-02-07 MED ORDER — LORAZEPAM 1 MG PO TABS
1.0000 mg | ORAL_TABLET | Freq: Three times a day (TID) | ORAL | Status: AC
  Administered 2015-02-08 – 2015-02-09 (×3): 1 mg via ORAL
  Filled 2015-02-07 (×2): qty 1

## 2015-02-07 MED ORDER — LORAZEPAM 1 MG PO TABS
1.0000 mg | ORAL_TABLET | Freq: Four times a day (QID) | ORAL | Status: DC | PRN
  Administered 2015-02-07 – 2015-02-09 (×3): 1 mg via ORAL
  Filled 2015-02-07 (×3): qty 1

## 2015-02-07 MED ORDER — VITAMIN B-1 100 MG PO TABS
100.0000 mg | ORAL_TABLET | Freq: Every day | ORAL | Status: DC
Start: 2015-02-08 — End: 2015-02-17
  Administered 2015-02-08 – 2015-02-17 (×10): 100 mg via ORAL
  Filled 2015-02-07 (×12): qty 1

## 2015-02-07 MED ORDER — HYDROXYZINE HCL 25 MG PO TABS
25.0000 mg | ORAL_TABLET | Freq: Four times a day (QID) | ORAL | Status: AC | PRN
  Administered 2015-02-07 – 2015-02-09 (×4): 25 mg via ORAL
  Filled 2015-02-07 (×4): qty 1

## 2015-02-07 MED ORDER — QUETIAPINE FUMARATE 50 MG PO TABS
50.0000 mg | ORAL_TABLET | Freq: Three times a day (TID) | ORAL | Status: DC
  Administered 2015-02-07 – 2015-02-10 (×9): 50 mg via ORAL
  Filled 2015-02-07 (×13): qty 1

## 2015-02-07 MED ORDER — ADULT MULTIVITAMIN W/MINERALS CH
1.0000 | ORAL_TABLET | Freq: Every day | ORAL | Status: DC
  Administered 2015-02-08 – 2015-02-17 (×10): 1 via ORAL
  Filled 2015-02-07 (×13): qty 1

## 2015-02-07 MED ORDER — ONDANSETRON 4 MG PO TBDP
4.0000 mg | ORAL_TABLET | Freq: Four times a day (QID) | ORAL | Status: AC | PRN
  Administered 2015-02-08 – 2015-02-09 (×3): 4 mg via ORAL
  Filled 2015-02-07 (×3): qty 1

## 2015-02-07 MED ORDER — QUETIAPINE FUMARATE 300 MG PO TABS
300.0000 mg | ORAL_TABLET | Freq: Every day | ORAL | Status: DC
  Administered 2015-02-07 – 2015-02-08 (×2): 300 mg via ORAL
  Filled 2015-02-07 (×4): qty 1

## 2015-02-07 MED ORDER — LORAZEPAM 1 MG PO TABS: 1.0000 mg | ORAL_TABLET | Freq: Every day | ORAL | Status: DC

## 2015-02-07 NOTE — Progress Notes (Signed)
D: Report was received from admitting nurse.  Introduced self to pt.  Pt denies SI/HI, denies hallucinations.  Pt focused on getting medications for withdrawal symptoms. A: Medications administered per order.  PRN medication administered for withdrawal symptoms, see flowsheet.  Safety maintained.   R: Pt verbally contracts for safety and reports that she will notify staff of needs and concerns.  Will continue to monitor and assess for safety.

## 2015-02-07 NOTE — Progress Notes (Signed)
.  Psychoeducational Group Note    Date: 02/07/2015 Time:  0930    Goal Setting Purpose of Group: To be able to set a goal that is measurable and that can be accomplished in one day  Participation Level:  Active  Participation Quality:  Attentive  Affect:  Appropriate  Cognitive:  Oriented  Insight:  Improving  Engagement in Group:  Engaged  Additional Comments:  Pt participated in the group and shared ideas and his goal for today. Pt arrived late.  Paulino Rily

## 2015-02-07 NOTE — Progress Notes (Addendum)
Erin [name] is becoming acclamated to being in hte hopsital and says she is glad " to be back...that she feels safe here ".  Her physical status is somwhat compromised, ie...she is obseved having many " fresh bruises" over her entire body. She has 2 plus oeda, foot and ankle edema and says this is her " norm" and she tells this nurse this is because " my liver's shot...". Her color is a sickly tannish - yellow but there are no yellow or dark sclera in her eyes. She speaks with a thick medicated tongue and this is her " status quo normal"     A She is seen circulating out in the milieu. SHe takes her meds as ordered. SHe requests and is given prn ativan , for complaints of " withdrawal ", saying " don't you see my skin...its all crawled up.. 't you tell I'm withdrawin???.     R Safety is in place, no complications observed and poc ocnt'Erin;

## 2015-02-07 NOTE — Progress Notes (Signed)
Psychoeducational Group Note  Date: 02/07/2015 Time:  1015  Group Topic/Focus:  Identifying Needs:   The focus of this group is to help patients identify their personal needs that have been historically problematic and identify healthy behaviors to address their needs.  Participation Level:  Active  Participation Quality:  Appropriate  Affect:  Appropriate  Cognitive:  Oriented  Insight:  Improving  Engagement in Group:  Engaged  Additional Comments:  Pt attended for a part of the group and left after being called out by the doctor. While there Pt added to the group with participation.  Paulino Rily

## 2015-02-07 NOTE — [facility] Group Notes (Signed)
[facility] Group Notes: (Clinical Social Work)   02/07/2015      Type of Therapy:  Group Therapy   Participation Level:  Did Not Attend despite MHT prompting   Selmer Dominion, LCSW 02/07/2015, 5:18 PM

## 2015-02-07 NOTE — H&P (Signed)
Psychiatric Admission Assessment Adult  Patient Identification: Erin [name] MRN:  [id number] Date of Evaluation:  02/07/2015 Chief Complaint:  ALCHOL USE DISORDER Principal Diagnosis: <principal problem not specified> Diagnosis:   Patient Active Problem List   Diagnosis Date Noted  . Bipolar affective disorder, depressed, severe [F31.4] 02/06/2015  . Suicidal ideations [R45.851]   . Opioid dependence [F11.20] 05/06/2014  . Cocaine dependence [F14.20] 07/17/2013  . Thrombocytopenia [D69.6] 01/06/2013  . Sarcoidosis [D86.9] 03/30/2010  . HYPERTENSION, BENIGN ESSENTIAL [I10] 01/11/2010  . HYPOTHYROIDISM [E03.9] 03/02/2009  . ALCOHOLIC LIVER DISEASE [L89.2] 03/06/2008  . CIRRHOSIS, ALCOHOLIC, LIVER [J19.41] 74/07/1447  . TOBACCO DEPENDENCE [Z72.0] 02/22/2007  . EXTERNAL HEMORRHOIDS [K64.8] 01/11/2005  . PORTAL HYPERTENSION [K76.6] 01/11/2005   History of Present Illness: Erin [name] is a 49 year old Caucasian female. Admitted to [facility] from the [facility]. She reports, "The police took me to the hospital ED because I wanted to sign myself in to the hospital. I have been binging on alcohol & cocaine everyday since Christmas. I need to stop. I need help getting into some rehab treatment. I was sober x 12 months prior to relapsing at Christmas. The holidays did me in. I did not have any family to celebrate Christmas with, so I used drugs and drank alcohol to help forget my problems. Right now, my body is doing a lot of shaking & twitching. I feel very anxious. Right now, my depression is at #9 and anxiety at #8".  Elements:  Location:  Alcohol/Copcaine dependence. Quality:  Tremors, high anxiety levels, insomnia, . Severity:  Severe. Timing:  "My drug use worsened since Christmas". Duration:  Chronic. Context:  "It was Christmas, I got no family to celebrate Xmas with, so used drugs & drank to cope".  Associated Signs/Symptoms:  Depression Symptoms:  depressed mood, feelings of  worthlessness/guilt, hopelessness, anxiety, insomnia, loss of energy/fatigue,  (Hypo) Manic Symptoms:  Impulsivity,  Anxiety Symptoms:  Excessive Worry,  Psychotic Symptoms:  Denies  PTSD Symptoms: NA  Total Time spent with patient: 1 hour  Past Medical History:  Past Medical History  Diagnosis Date  . Hypothyroidism   . Blood transfusion July 2012  . Depression   . Anxiety   . Bipolar 1 disorder   . Menorrhagia   . Liver failure   . Bronchitis   . Cirrhosis of liver   . Wegener's granulomatosis   . History of sarcoidosis     Past Surgical History  Procedure Laterality Date  . Tubal ligation    . Laparoscopy    . Orthopedic surgery    . Cholecystectomy    . Fracture surgery    . Hernia repair    . Vaginal hysterectomy  07/27/2011    Procedure: HYSTERECTOMY VAGINAL;  Surgeon: [name], MD;  Location: [facility] [facility];  Service: Gynecology;  Laterality: N/A;  Vaginal Hysterectomy with Right Oophorectomy  . Laparotomy  07/28/2011    Procedure: EXPLORATORY LAPAROTOMY;  Surgeon: [name], MD;  Location: [facility];  Service: Gynecology;  Laterality: N/A;   Family History:  Family History  Problem Relation Age of Onset  . Diabetes Mother   . Hypertension Mother   . Diabetes Daughter   . Hypertension Daughter    Social History:  History  Alcohol Use  . Yes    Comment: a fifth of vodka and a 12 pk a day of beer      History  Drug Use  . Yes  . Special: Cocaine, Marijuana  Comment: opioids, benzos    History   Social History  . Marital Status: Divorced    Spouse Name: N/A  . Number of Children: N/A  . Years of Education: N/A   Social History Main Topics  . Smoking status: Current Some Day Smoker -- 0.25 packs/day  . Smokeless tobacco: Never Used  . Alcohol Use: Yes     Comment: a fifth of vodka and a 12 pk a day of beer   . Drug Use: Yes    Special: Cocaine, Marijuana     Comment: opioids, benzos  . Sexual Activity: Yes    Birth Control/ Protection:  None   Other Topics Concern  . None   Social History Narrative   Additional Social History:    History of alcohol / drug use?: Yes Longest period of sobriety (when/how long): 2.5 years Withdrawal Symptoms: Sweats, Tremors, Nausea / Vomiting, Seizures Onset of Seizures: detox Date of most recent seizure: 3 years ago Name of Substance 1: ETOH 1 - Age of First Use: 49 years old 1 - Amount (size/oz): alcohol binges 1 - Frequency: daily 1 - Duration: daily since Thanksgiving 2015 1 - Last Use / Amount: 02/03/14 Name of Substance 2: Cocaine 2 - Age of First Use: 49 yrs old 2 - Amount (size/oz): unknown amount 2 - Frequency: sporadic 2 - Last Use / Amount: 02/02/15  Musculoskeletal: Strength & Muscle Tone: within normal limits Gait & Station: normal Patient leans: N/A  Psychiatric Specialty Exam: Physical Exam  Constitutional: She is oriented to person, place, and time. She appears well-developed.  Obese  HENT:  Head: Normocephalic.  Eyes: Pupils are equal, round, and reactive to light.  Neck: Normal range of motion.  Cardiovascular: Normal rate.   Respiratory: Effort normal.  GI: Soft.  Genitourinary:  Denies any issues in this area  Musculoskeletal: Normal range of motion.  Neurological: She is alert and oriented to person, place, and time.  Skin: Skin is warm and dry.  Psychiatric: Her speech is normal and behavior is normal. Thought content normal. Her mood appears anxious. Her affect is not angry, not blunt, not labile and not inappropriate. Cognition and memory are normal. She expresses impulsivity. She exhibits a depressed mood.    Review of Systems  Constitutional: Positive for chills, malaise/fatigue and diaphoresis.  HENT: Negative.   Eyes: Negative.   Respiratory: Negative.   Cardiovascular: Negative.   Genitourinary: Negative.   Musculoskeletal: Negative.   Skin: Negative.   Neurological: Positive for dizziness, tremors and weakness.  Endo/Heme/Allergies:  Negative.   Psychiatric/Behavioral: Positive for depression and substance abuse (Polysubstance dependence). Negative for suicidal ideas, hallucinations and memory loss. The patient is nervous/anxious and has insomnia.     Blood pressure 129/80, pulse 65, temperature 98.5 F (36.9 C), temperature source Oral, resp. rate 20, height '5\' 7"'  (1.702 m), weight 109.77 kg (242 lb).Body mass index is 37.89 kg/(m^2).  General Appearance: Disheveled and obese  Eye Contact::  Fair  Speech:  Clear and Coherent  Volume:  Normal  Mood:  Angry and Anxious  Affect:  Flat  Thought Process:  Coherent and Goal Directed  Orientation:  Full (Time, Place, and Person)  Thought Content:  Rumination and denies any hallucinations  Suicidal Thoughts:  No  Homicidal Thoughts:  No  Memory:  Immediate;   Good Recent;   Good Remote;   Fair  Judgement:  Fair  Insight:  Fair  Psychomotor Activity:  Restlessness and Tremor  Concentration:  Fair  Recall:  [place] of Knowledge:Poor  Language: Fair  Akathisia:  No  Handed:  Right  AIMS (if indicated):     Assets:  Desire for Improvement  ADL's:  Fair  Cognition: WNL  Sleep:  Number of Hours: 6   Risk to Self:  No Risk to Others: No Prior Inpatient Therapy: Yes Prior Outpatient Therapy: Yes Alcohol Screening: 1. How often do you have a drink containing alcohol?: 4 or more times a week 2. How many drinks containing alcohol do you have on a typical day when you are drinking?: 7, 8, or 9 3. How often do you have six or more drinks on one occasion?: Daily or almost daily Preliminary Score: 7 4. How often during the last year have you found that you were not able to stop drinking once you had started?: Daily or almost daily 5. How often during the last year have you failed to do what was normally expected from you becasue of drinking?: Daily or almost daily 6. How often during the last year have you needed a first drink in the morning to get yourself going after a  heavy drinking session?: Never 7. How often during the last year have you had a feeling of guilt of remorse after drinking?: Never 8. How often during the last year have you been unable to remember what happened the night before because you had been drinking?: Monthly 9. Have you or someone else been injured as a result of your drinking?: No 10. Has a relative or friend or a doctor or another health worker been concerned about your drinking or suggested you cut down?: Yes, during the last year Alcohol Use Disorder Identification Test Final Score (AUDIT): 25  Allergies:   Allergies  Allergen Reactions  . Fluoxetine Hcl Other (See Comments)     hyperactivity  . Metoclopramide Hcl Other (See Comments)    depression  . Morphine And Related Other (See Comments)    High doses cause hallucinations   Lab Results:  Results for orders placed or performed during the hospital encounter of 02/04/15 (from the past 48 hour(s))  TSH     Status: None   Collection Time: 02/06/15  3:23 PM  Result Value Ref Range   TSH 1.529 0.350 - 4.500 uIU/mL    Comment: Performed at [facility]   Current Medications: Current Facility-Administered Medications  Medication Dose Route Frequency Provider Last Rate Last Dose  . acetaminophen (TYLENOL) tablet 650 mg  650 mg Oral Q6H PRN Waylan Boga, NP      . albuterol (PROVENTIL HFA;VENTOLIN HFA) 108 (90 BASE) MCG/ACT inhaler 1-2 puff  1-2 puff Inhalation Q6H PRN Waylan Boga, NP      . alum & mag hydroxide-simeth (MAALOX/MYLANTA) 200-200-20 MG/5ML suspension 30 mL  30 mL Oral Q4H PRN Waylan Boga, NP      . buPROPion [facility] SR) 12 hr tablet 150 mg  150 mg Oral Daily Waylan Boga, NP   150 mg at 02/07/15 0759  . carisoprodol (SOMA) tablet 350 mg  350 mg Oral BID Waylan Boga, NP   350 mg at 02/07/15 0800  . DULoxetine (CYMBALTA) DR capsule 60 mg  60 mg Oral BID Waylan Boga, NP   60 mg at 02/07/15 0759  . hydrOXYzine (ATARAX/VISTARIL) tablet 25 mg  25 mg  Oral Q6H PRN Encarnacion Slates, NP      . Influenza vac split quadrivalent PF (FLUARIX) injection 0.5 mL  0.5 mL Intramuscular Tomorrow-1000 Nicholaus Bloom, MD      .  loperamide (IMODIUM) capsule 2-4 mg  2-4 mg Oral PRN Encarnacion Slates, NP      . LORazepam (ATIVAN) tablet 1 mg  1 mg Oral Q6H PRN Encarnacion Slates, NP      . LORazepam (ATIVAN) tablet 1 mg  1 mg Oral QID Encarnacion Slates, NP       Followed by  . [START ON 02/08/2015] LORazepam (ATIVAN) tablet 1 mg  1 mg Oral TID Encarnacion Slates, NP       Followed by  . [START ON 02/09/2015] LORazepam (ATIVAN) tablet 1 mg  1 mg Oral BID Encarnacion Slates, NP       Followed by  . [START ON 02/11/2015] LORazepam (ATIVAN) tablet 1 mg  1 mg Oral Daily Encarnacion Slates, NP      . magnesium hydroxide (MILK OF MAGNESIA) suspension 30 mL  30 mL Oral Daily PRN Waylan Boga, NP      . multivitamin with minerals tablet 1 tablet  1 tablet Oral Daily Encarnacion Slates, NP      . ondansetron (ZOFRAN-ODT) disintegrating tablet 4 mg  4 mg Oral Q6H PRN Encarnacion Slates, NP      . pneumococcal 23 valent vaccine (PNU-IMMUNE) injection 0.5 mL  0.5 mL Intramuscular Tomorrow-1000 Nicholaus Bloom, MD      . propranolol (INDERAL) tablet 20 mg  20 mg Oral BID Waylan Boga, NP   20 mg at 02/07/15 0759  . QUEtiapine (SEROQUEL) tablet 400 mg  400 mg Oral QHS Waylan Boga, NP   400 mg at 02/06/15 2103  . QUEtiapine (SEROQUEL) tablet 50 mg  50 mg Oral TID Encarnacion Slates, NP      . thiamine (B-1) injection 100 mg  100 mg Intramuscular Once Encarnacion Slates, NP      . Derrill Memo ON 02/08/2015] thiamine (VITAMIN B-1) tablet 100 mg  100 mg Oral Daily Encarnacion Slates, NP      . thyroid (ARMOUR) tablet 30 mg  30 mg Oral QAC breakfast Waylan Boga, NP   30 mg at 02/07/15 0644  . zolpidem (AMBIEN) tablet 5 mg  5 mg Oral QHS PRN Waylan Boga, NP   5 mg at 02/07/15 0154   PTA Medications: Prescriptions prior to admission  Medication Sig Dispense Refill Last Dose  . albuterol (PROVENTIL HFA;VENTOLIN HFA) 108 (90 BASE) MCG/ACT  inhaler Inhale 1-2 puffs into the lungs every 6 (six) hours as needed for wheezing.   Unknown at Unknown time  . buPROPion (WELLBUTRIN SR) 150 MG 12 hr tablet Take 150 mg by mouth daily.  2 Unknown at Unknown time  . carisoprodol (SOMA) 350 MG tablet Take 350 mg by mouth 2 (two) times daily.   Unknown at Unknown time  . clonazePAM (KLONOPIN) 0.5 MG tablet Take 0.5 mg by mouth 2 (two) times daily as needed for anxiety (anxiety).   Unknown at Unknown time  . docusate sodium (COLACE) 100 MG capsule Take 1 capsule (100 mg total) by mouth 2 (two) times daily. (This is an over the counter medicine): For constipation (Patient not taking: Reported on 02/04/2015) 10 capsule 0 Unknown at Unknown time  . DULoxetine (CYMBALTA) 60 MG capsule Take 60 mg by mouth 2 (two) times daily.  2 Unknown at Unknown time  . gabapentin (NEURONTIN) 300 MG capsule Take 1 capsule (300 mg total) by mouth 3 (three) times daily. For substance withdrawal syndrome (Patient not taking: Reported on 02/04/2015) 90 capsule 0 Unknown at Unknown  time  . lithium carbonate 300 MG capsule Take 1 capsule (300 mg total) by mouth 2 (two) times daily with a meal. For mood stabilization (Patient not taking: Reported on 02/04/2015) 60 capsule 0 Unknown at Unknown time  . OLANZapine (ZYPREXA) 5 MG tablet Take 1 tablet (5 mg total) by mouth 2 (two) times daily. For mood control (Patient not taking: Reported on 02/04/2015) 60 tablet 0 Unknown at Unknown time  . polyethylene glycol powder (GLYCOLAX/MIRALAX) powder Take 17 g by mouth daily. Until daily soft stools  OTC (Patient not taking: Reported on 02/04/2015) 255 g 0 Unknown at Unknown time  . propranolol (INDERAL) 20 MG tablet Take 1 tablet (20 mg total) by mouth 2 (two) times daily. For anxiety (Patient not taking: Reported on 02/04/2015) 60 tablet 0 Unknown at Unknown time  . QUEtiapine (SEROQUEL) 400 MG tablet Take 400 mg by mouth at bedtime.   Unknown at Unknown time  . thyroid (ARMOUR) 30 MG tablet  Take 1 tablet (30 mg total) by mouth daily before breakfast. For low thyroid function (Patient not taking: Reported on 02/04/2015) 30 tablet 0 Unknown at Unknown time  . traZODone (DESYREL) 100 MG tablet Take 1 tablet (100 mg total) by mouth at bedtime and may repeat dose one time if needed. For sleep (Patient not taking: Reported on 02/04/2015) 60 tablet 0 Unknown at Unknown time  . zolpidem (AMBIEN) 5 MG tablet Take 5 mg by mouth at bedtime as needed for sleep (sleep).   Unknown at Unknown time   Previous Psychotropic Medications: Yes   Substance Abuse History in the last 12 months:  Yes.    Consequences of Substance Abuse: Medical Consequences:  Liver damage, Possible death by overdose Legal Consequences:  Arrests, jail time, Loss of driving privilege. Family Consequences:  Family discord, divorce and or separation.  Results for orders placed or performed during the hospital encounter of 02/04/15 (from the past 72 hour(s))  TSH     Status: None   Collection Time: 02/06/15  3:23 PM  Result Value Ref Range   TSH 1.529 0.350 - 4.500 uIU/mL    Comment: Performed at [facility]    Observation Level/Precautions:  15 minute checks  Laboratory:  Per ED  Psychotherapy: Group session   Medications: See medication lists   Consultations: As needed    Discharge Concerns: Maintaining sobriety   Estimated LOS: 2-4 days  Other:     Psychological Evaluations: Yes   Treatment Plan Summary: Daily contact with patient to assess and evaluate symptoms and progress in treatment and Medication management: 1. Admit for crisis management and stabilization, estimated length of stay 3-5 days.  2. Medication management to reduce current symptoms to base line and improve the patient's overall level of functioning; Initiate Ativan detox regimen, Seroquel 50 mg tid for aigitation 3. Treat health problems as indicated.  4. Develop treatment plan to decrease risk of relapse upon discharge and the need for  readmission.  5. Psycho-social education regarding relapse prevention and self care.  6. Health care follow up as needed for medical problems.  7. Review, reconcile, and reinstate any pertinent home medications for other health issues where appropriate. 8. Call for consults with hospitalist for any additional specialty patient care services as needed.  Medical Decision Making:  New problem, with additional work up planned, Review of Psycho-Social Stressors (1), Review or order clinical lab tests (1), Review and summation of old records (2), Review of Medication Regimen & Side Effects (2) and  Review of New Medication or Change in Dosage (2)  I certify that inpatient services furnished can reasonably be expected to improve the patient's condition.   [place], [place], FNP-BC  2/13/20161:29 PM  I have discussed case with NP and have met with patient. I agree with NP's Note, Assessment , Plan  Patient is a 49 year old female, reports that after a long period of sobriety, relapsed on cocaine and alcohol over the holiday season. Reports depression and frustration in the context of relapse. Presented requesting detoxification.

## 2015-02-07 NOTE — BHH Suicide Risk Assessment (Addendum)
[facility] Admission Suicide Risk Assessment   Nursing information obtained from:  Patient Demographic factors:  Caucasian, Living alone, Unemployed Current Mental Status:  NA Loss Factors:  NA Historical Factors:  Prior suicide attempts, NA Risk Reduction Factors:  NA Total Time spent with patient: 45 minutes Principal Problem: Alcohol Dependence, Cocaine Abuse, Mood Disorder- has been diagnosed with Bipolar Disorder  Diagnosis:   Patient Active Problem List   Diagnosis Date Noted  . Bipolar affective disorder, depressed, severe [F31.4] 02/06/2015  . Suicidal ideations [R45.851]   . Opioid dependence [F11.20] 05/06/2014  . Cocaine dependence [F14.20] 07/17/2013  . Thrombocytopenia [D69.6] 01/06/2013  . Sarcoidosis [D86.9] 03/30/2010  . HYPERTENSION, BENIGN ESSENTIAL [I10] 01/11/2010  . HYPOTHYROIDISM [E03.9] 03/02/2009  . ALCOHOLIC LIVER DISEASE [X52.8] 03/06/2008  . CIRRHOSIS, ALCOHOLIC, LIVER [U13.24] 40/09/2724  . TOBACCO DEPENDENCE [Z72.0] 02/22/2007  . EXTERNAL HEMORRHOIDS [K64.8] 01/11/2005  . PORTAL HYPERTENSION [K76.6] 01/11/2005     Continued Clinical Symptoms:  Alcohol Use Disorder Identification Test Final Score (AUDIT): 25 The "Alcohol Use Disorders Identification Test", Guidelines for Use in Primary Care, Second Edition.  World Pharmacologist [facility] Score between 0-7:  no or low risk or alcohol related problems. Score between 8-15:  moderate risk of alcohol related problems. Score between 16-19:  high risk of alcohol related problems. Score 20 or above:  warrants further diagnostic evaluation for alcohol dependence and treatment.   CLINICAL FACTORS:  49 year old female, reports history of mood disorder and alcohol/cocaine dependencies.  States that after one years of sobriety relapsed about three months ago. Reports a history of severe withdrawals in the past, and may have had seizures in the past. At this time presents with symptoms of withdrawal- primarily  anxiety, mild distal tremors. Vitals are stable.    Musculoskeletal: Strength & Muscle Tone: within normal limits- mild distal tremors  Gait & Station: normal Patient leans: N/A  Psychiatric Specialty Exam: Physical Exam  ROS  Blood pressure 129/80, pulse 65, temperature 98.5 F (36.9 C), temperature source Oral, resp. rate 20, height 5\' 7"  (1.702 m), weight 242 lb (109.77 kg).Body mass index is 37.89 kg/(m^2).  General Appearance: Fairly Groomed  Engineer, water::  Good  Speech:  Normal Rate  Volume:  Decreased  Mood:  Depressed, mildly anxious   Affect:  Constricted and anxious  Thought Process:  Linear  Orientation:  Other:  fully alert and attentive, no evidence of delirium at this time  Thought Content:  currently denies hallucinations , and does not appear internally preoccupied. No delusions expressed   Suicidal Thoughts:  No- at this time denies plan or intention of hurting self and contracts for safety on unit  Homicidal Thoughts:  No  Memory:  recent and remote grossly intact   Judgement:  Fair  Insight:  Present  Psychomotor Activity:  slight distal tremors, no psychomotor agitation  Concentration:  Fair  Recall:  Good  Fund of Knowledge:Good  Language: Good  Akathisia:  Negative  Handed:  Right  AIMS (if indicated):     Assets:  Desire for Improvement Resilience  Sleep:  Number of Hours: 6  Cognition: grossly intact   ADL's:  Impaired      COGNITIVE FEATURES THAT CONTRIBUTE TO RISK:  Closed-mindedness and Loss of executive function    SUICIDE RISK:   Moderate:  Frequent suicidal ideation with limited intensity, and duration, some specificity in terms of plans, no associated intent, good self-control, limited dysphoria/symptomatology, some risk factors present, and identifiable protective factors, including available and  accessible social support.  PLAN OF CARE: Patient will be admitted to inpatient psychiatric unit for stabilization and safety. Will provide  and encourage milieu participation. Provide medication management and maked adjustments as needed.   Will also add medication management to address alcohol withdrawal symptoms. Will follow daily.  * Patient currently on Ativan alcohol detox protocol. Will taper down Seroquel dose, Soma dose, and D/C Ambien PRNs to minimize the risk of excessive sedation. Will also stop Wellbutrin due to increased risk of seizure during active alcohol withdrawal.   Medical Decision Making:  Review of Psycho-Social Stressors (1), Review or order clinical lab tests (1), Established Problem, Worsening (2) and Review of Medication Regimen & Side Effects (2)  I certify that inpatient services furnished can reasonably be expected to improve the patient's condition.   COBOS, FERNANDO 02/07/2015, 1:42 PM
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
## 2015-02-08 DIAGNOSIS — F314 Bipolar disorder, current episode depressed, severe, without psychotic features: Secondary | ICD-10-CM

## 2015-02-08 DIAGNOSIS — F319 Bipolar disorder, unspecified: Secondary | ICD-10-CM | POA: Diagnosis not present

## 2015-02-08 DIAGNOSIS — F419 Anxiety disorder, unspecified: Secondary | ICD-10-CM

## 2015-02-08 DIAGNOSIS — R45851 Suicidal ideations: Secondary | ICD-10-CM

## 2015-02-08 MED ORDER — MAGNESIUM CITRATE PO SOLN
1.0000 | Freq: Once | ORAL | Status: AC
  Administered 2015-02-08: 1 via ORAL

## 2015-02-08 NOTE — Plan of Care (Signed)
Problem: Alteration in mood Goal: LTG-Patient reports reduction in suicidal thoughts (Patient reports reduction in suicidal thoughts and is able to verbalize a safety plan for whenever patient is feeling suicidal)  Outcome: Progressing Pt denies SI this shift and verbally contracts for safety.    Problem: Diagnosis: Increased Risk For Suicide Attempt Goal: STG-Patient Will Comply With Medication Regime Outcome: Progressing Pt has been compliant with medications this shift.

## 2015-02-08 NOTE — Progress Notes (Signed)
[facility] MD Progress Note  02/08/2015 3:47 PM ZOELLE MARKUS  MRN:  [id number]  Subjective:  Jalexa reports that she is up all night because of nightmares. However, documentation indicated 6 hours of sleep. She says her mood remains depressed. She says she feels agitated & restless. She is complaining of constipation & says she has not had a bowel movement in almost a week. She is ordered Mag citrate 1 bottle once. She is encouraged to continue her medications as ordered & give them time to work. Patient was likely non-adherence to her medications while binging on cocaine & alcohol. She is encouraged to participate in group sessions.  Principal Problem: <principal problem not specified> Diagnosis:   Patient Active Problem List   Diagnosis Date Noted  . Alcohol dependence with alcohol-induced mood disorder [F10.24]   . Bipolar affective disorder, depressed, severe [F31.4] 02/06/2015  . Suicidal ideations [R45.851]   . Opioid dependence [F11.20] 05/06/2014  . Cocaine dependence [F14.20] 07/17/2013  . Thrombocytopenia [D69.6] 01/06/2013  . Sarcoidosis [D86.9] 03/30/2010  . HYPERTENSION, BENIGN ESSENTIAL [I10] 01/11/2010  . HYPOTHYROIDISM [E03.9] 03/02/2009  . ALCOHOLIC LIVER DISEASE [J88.4] 03/06/2008  . CIRRHOSIS, ALCOHOLIC, LIVER [Z66.06] 30/16/0109  . TOBACCO DEPENDENCE [Z72.0] 02/22/2007  . EXTERNAL HEMORRHOIDS [K64.8] 01/11/2005  . PORTAL HYPERTENSION [K76.6] 01/11/2005   Total Time spent with patient: 35 minutes  Past Medical History:  Past Medical History  Diagnosis Date  . Hypothyroidism   . Blood transfusion July 2012  . Depression   . Anxiety   . Bipolar 1 disorder   . Menorrhagia   . Liver failure   . Bronchitis   . Cirrhosis of liver   . Wegener's granulomatosis   . History of sarcoidosis     Past Surgical History  Procedure Laterality Date  . Tubal ligation    . Laparoscopy    . Orthopedic surgery    . Cholecystectomy    . Fracture surgery    . Hernia  repair    . Vaginal hysterectomy  07/27/2011    Procedure: HYSTERECTOMY VAGINAL;  Surgeon: [name], MD;  Location: [facility];  Service: Gynecology;  Laterality: N/A;  Vaginal Hysterectomy with Right Oophorectomy  . Laparotomy  07/28/2011    Procedure: EXPLORATORY LAPAROTOMY;  Surgeon: [name], MD;  Location: [facility];  Service: Gynecology;  Laterality: N/A;   Family History:  Family History  Problem Relation Age of Onset  . Diabetes Mother   . Hypertension Mother   . Diabetes Daughter   . Hypertension Daughter    Social History:  History  Alcohol Use  . Yes    Comment: a fifth of vodka and a 12 pk a day of beer      History  Drug Use  . Yes  . Special: Cocaine, Marijuana    Comment: opioids, benzos    History   Social History  . Marital Status: Divorced    Spouse Name: N/A  . Number of Children: N/A  . Years of Education: N/A   Social History Main Topics  . Smoking status: Current Some Day Smoker -- 0.25 packs/day  . Smokeless tobacco: Never Used  . Alcohol Use: Yes     Comment: a fifth of vodka and a 12 pk a day of beer   . Drug Use: Yes    Special: Cocaine, Marijuana     Comment: opioids, benzos  . Sexual Activity: Yes    Birth Control/ Protection: None   Other Topics Concern  . None  Social History Narrative   Additional History:    Sleep: "Poor", although documentation indicated 6 hours  Appetite:  Good  Assessment:   Musculoskeletal: Strength & Muscle Tone: within normal limits Gait & Station: normal Patient leans: N/A  Psychiatric Specialty Exam: Physical Exam  Review of Systems  Constitutional: Positive for chills and malaise/fatigue.  HENT: Negative.   Eyes: Negative.   Respiratory: Negative.   Cardiovascular: Negative.   Gastrointestinal: Positive for abdominal pain and constipation.  Genitourinary: Negative.   Musculoskeletal: Negative.   Skin: Negative.   Neurological: Positive for tremors and weakness.  Endo/Heme/Allergies:  Negative.   Psychiatric/Behavioral: Positive for depression and substance abuse (Alcoholism, chronic). Negative for suicidal ideas, hallucinations and memory loss. The patient is nervous/anxious and has insomnia.     Blood pressure 117/79, pulse 70, temperature 97.9 F (36.6 C), temperature source Oral, resp. rate 20, height 5\' 7"  (1.702 m), weight 109.77 kg (242 lb).Body mass index is 37.89 kg/(m^2).  General Appearance: Casual  Eye Contact::  Good  Speech:  Clear and Coherent  Volume:  Normal  Mood:  Anxious and Depressed  Affect:  Flat  Thought Process:  Coherent and Goal Directed  Orientation:  Full (Time, Place, and Person)  Thought Content:  Rumination  Suicidal Thoughts:  No  Homicidal Thoughts:  No  Memory:  Immediate;   Good Recent;   Fair Remote;   Fair  Judgement:  Fair  Insight:  Fair  Psychomotor Activity:  Tremor  Concentration:  Fair  Recall:  [employer] of Knowledge:Fair  Language: Good  Akathisia:  No  Handed:  Right  AIMS (if indicated):     Assets:  Desire for Improvement  ADL's:  Intact  Cognition: WNL  Sleep:  Number of Hours: 6     Current Medications: Current Facility-Administered Medications  Medication Dose Route Frequency Provider Last Rate Last Dose  . acetaminophen (TYLENOL) tablet 650 mg  650 mg Oral Q6H PRN Waylan Boga, NP      . albuterol (PROVENTIL HFA;VENTOLIN HFA) 108 (90 BASE) MCG/ACT inhaler 1-2 puff  1-2 puff Inhalation Q6H PRN Waylan Boga, NP      . alum & mag hydroxide-simeth (MAALOX/MYLANTA) 200-200-20 MG/5ML suspension 30 mL  30 mL Oral Q4H PRN Waylan Boga, NP      . carisoprodol (SOMA) tablet 350 mg  350 mg Oral q morning - 10a Jenne Campus, MD   350 mg at 02/08/15 0827  . DULoxetine (CYMBALTA) DR capsule 60 mg  60 mg Oral BID Waylan Boga, NP   60 mg at 02/08/15 0829  . hydrOXYzine (ATARAX/VISTARIL) tablet 25 mg  25 mg Oral Q6H PRN Encarnacion Slates, NP   25 mg at 02/07/15 2258  . loperamide (IMODIUM) capsule 2-4 mg  2-4 mg  Oral PRN Encarnacion Slates, NP      . LORazepam (ATIVAN) tablet 1 mg  1 mg Oral Q6H PRN Encarnacion Slates, NP   1 mg at 02/08/15 0437  . LORazepam (ATIVAN) tablet 1 mg  1 mg Oral TID Encarnacion Slates, NP   1 mg at 02/08/15 1152   Followed by  . [START ON 02/09/2015] LORazepam (ATIVAN) tablet 1 mg  1 mg Oral BID Encarnacion Slates, NP       Followed by  . [START ON 02/11/2015] LORazepam (ATIVAN) tablet 1 mg  1 mg Oral Daily Encarnacion Slates, NP      . magnesium citrate solution 1 Bottle  1 Bottle Oral Once  Encarnacion Slates, NP      . magnesium hydroxide (MILK OF MAGNESIA) suspension 30 mL  30 mL Oral Daily PRN Waylan Boga, NP      . multivitamin with minerals tablet 1 tablet  1 tablet Oral Daily Encarnacion Slates, NP   1 tablet at 02/08/15 [phone]  . ondansetron (ZOFRAN-ODT) disintegrating tablet 4 mg  4 mg Oral Q6H PRN Encarnacion Slates, NP   4 mg at 02/08/15 3762  . propranolol (INDERAL) tablet 20 mg  20 mg Oral BID Waylan Boga, NP   20 mg at 02/08/15 0829  . QUEtiapine (SEROQUEL) tablet 300 mg  300 mg Oral QHS Jenne Campus, MD   300 mg at 02/07/15 2100  . QUEtiapine (SEROQUEL) tablet 50 mg  50 mg Oral TID Encarnacion Slates, NP   50 mg at 02/08/15 1152  . thiamine (B-1) injection 100 mg  100 mg Intramuscular Once Encarnacion Slates, NP   100 mg at 02/07/15 1330  . thiamine (VITAMIN B-1) tablet 100 mg  100 mg Oral Daily Encarnacion Slates, NP   100 mg at 02/08/15 8315  . thyroid (ARMOUR) tablet 30 mg  30 mg Oral QAC breakfast Waylan Boga, NP   30 mg at 02/08/15 1761    Lab Results: No results found for this or any previous visit (from the past 48 hour(s)).  Physical Findings: AIMS: Facial and Oral Movements Muscles of Facial Expression: None, normal Lips and Perioral Area: None, normal Jaw: None, normal Tongue: None, normal,Extremity Movements Upper (arms, wrists, hands, fingers): None, normal Lower (legs, knees, ankles, toes): None, normal, Trunk Movements Neck, shoulders, hips: None, normal, Overall Severity Severity of  abnormal movements (highest score from questions above): None, normal Incapacitation due to abnormal movements: None, normal Patient's awareness of abnormal movements (rate only patient's report): No Awareness, Dental Status Current problems with teeth and/or dentures?: No Does patient usually wear dentures?: No  CIWA:  CIWA-Ar Total: 5 COWS:  COWS Total Score: 10  Treatment Plan Summary: Daily contact with patient to assess and evaluate symptoms and progress in treatment and Medication management:   Plan: Supportive approach/coping skills/relapse prevention           Depression: Continue DuloxetineDR 60 mg, Insomnia: continue Ativan detox regimen , Propranolol 20 mg for anxiety/HTN, Seroquel 300 mg for mood control & 25 mg tid for agitation, Vistaril 25 mg prn for anxiety/tension.  Continue to work with mindfulness, CBT help  identify the cognitive distortions that keep the depression going.          Discuss other life style changes that can help with both his depression and her alcohol use such like exercise, meditation           Will use motivational interviewing and encourage to continue medication management & counseling services while inpatient & after discharge.                      Continue to educate on the effects of substance abuse and other recovery strategies.            Magnesium Citrate 1 bottle x once for constipation.    Medical Decision Making:  Established Problem, Stable/Improving (1), Review of Psycho-Social Stressors (1), Review of Medication Regimen & Side Effects (2) and Review of New Medication or Change in Dosage (2)  Encarnacion Slates, PMHNP, FNP-BC 02/08/2015, 3:47 PM

## 2015-02-08 NOTE — BHH Group Notes (Signed)
[facility] Group Notes: (Clinical Social Work)   02/08/2015      Type of Therapy:  Group Therapy   Participation Level:  Did Not Attend despite MHT prompting   Selmer Dominion, LCSW 02/08/2015, 2:53 PM

## 2015-02-08 NOTE — Progress Notes (Signed)
Psychoeducational Group Note  Date:  02/07/2015 Time:  2100  Group Topic/Focus:  Wrap up group  Participation Level: Did Not Attend  Participation Quality:  Not Applicable  Affect:  Not Applicable  Cognitive:  Not Applicable  Insight:  Not Applicable  Engagement in Group: Not Applicable  Additional Comments: Pt was notified that group was beginning but remained in bed.    Jacques Navy 02/08/2015, 4:34 AM

## 2015-02-08 NOTE — Progress Notes (Addendum)
[name] is at med window first thing this morning at 0730, as soon as this shift personnel  Is seen coming out of shift report. She is wide-eyed. She is patting the med window cill  With her hand and she says" I'm just here...waiting for you to come give me my medicine". " I feel rough..   I  need some more medicine".    A She is wide-eyed. Agitated. Requested and given prn medication for c/o nausea . SL given despite her request for IM phenergan. She reports wanting to " sleep all day" and this is what she does in between her periods of waking up and snacking and talking whatever meds are available. She completes her daily self inventory and on it she  Writes she denies experiencing SI within the past 24 hrs and she rates her depression, hopelessness and anxiety "9/8/8", respectively.   R Safety is in place and poc includes continuing to reinforce pt engagement AND compliance  in treatment. Will cont to offer positive reinforcement with out placing judgement.  1826 Erin [name] has magnesium citrate ( for constipation ) scheduled at 1800 and she does not want to take it now and requests to take at 2100 so it is moved forward as " due " .

## 2015-02-09 DIAGNOSIS — F1994 Other psychoactive substance use, unspecified with psychoactive substance-induced mood disorder: Secondary | ICD-10-CM | POA: Insufficient documentation

## 2015-02-09 DIAGNOSIS — F329 Major depressive disorder, single episode, unspecified: Secondary | ICD-10-CM

## 2015-02-09 DIAGNOSIS — F1320 Sedative, hypnotic or anxiolytic dependence, uncomplicated: Secondary | ICD-10-CM

## 2015-02-09 DIAGNOSIS — G4700 Insomnia, unspecified: Secondary | ICD-10-CM

## 2015-02-09 MED ORDER — LORAZEPAM 1 MG PO TABS
1.0000 mg | ORAL_TABLET | Freq: Every day | ORAL | Status: AC
  Administered 2015-02-13: 1 mg via ORAL
  Filled 2015-02-09: qty 1

## 2015-02-09 MED ORDER — LORAZEPAM 1 MG PO TABS
1.0000 mg | ORAL_TABLET | Freq: Four times a day (QID) | ORAL | Status: AC
  Administered 2015-02-09 – 2015-02-10 (×4): 1 mg via ORAL
  Filled 2015-02-09 (×4): qty 1

## 2015-02-09 MED ORDER — LORAZEPAM 1 MG PO TABS
1.0000 mg | ORAL_TABLET | Freq: Three times a day (TID) | ORAL | Status: AC
  Administered 2015-02-10 – 2015-02-11 (×3): 1 mg via ORAL
  Filled 2015-02-09 (×3): qty 1

## 2015-02-09 MED ORDER — CARISOPRODOL 350 MG PO TABS
350.0000 mg | ORAL_TABLET | Freq: Three times a day (TID) | ORAL | Status: DC
  Administered 2015-02-09 – 2015-02-17 (×24): 350 mg via ORAL
  Filled 2015-02-09 (×24): qty 1

## 2015-02-09 MED ORDER — LORAZEPAM 1 MG PO TABS
1.0000 mg | ORAL_TABLET | Freq: Two times a day (BID) | ORAL | Status: AC
  Administered 2015-02-11 – 2015-02-12 (×2): 1 mg via ORAL
  Filled 2015-02-09 (×2): qty 1

## 2015-02-09 MED ORDER — QUETIAPINE FUMARATE 400 MG PO TABS
400.0000 mg | ORAL_TABLET | Freq: Every day | ORAL | Status: DC
  Administered 2015-02-09: 400 mg via ORAL
  Filled 2015-02-09 (×2): qty 1

## 2015-02-09 MED ORDER — LORAZEPAM 1 MG PO TABS
1.0000 mg | ORAL_TABLET | Freq: Four times a day (QID) | ORAL | Status: AC | PRN
  Administered 2015-02-10 (×2): 1 mg via ORAL
  Filled 2015-02-09 (×2): qty 1

## 2015-02-09 NOTE — Progress Notes (Signed)
D Pt. Denies SI and HI, no complaints of pain or discomfort noted at this time.    A Writer offers support and encouragement, discussed coping skills with pt.   R Pt. Remains safe on the unit,  Continues to report anxiety which she received ativan and vistaril to treat.  Reports a better day today.

## 2015-02-09 NOTE — Progress Notes (Signed)
Erin [name] has spent much of her day sleeping. When she wakes up, she comes out to the med room, asks for " whatever I can have" and then returns to her bed.    A She has yet to complete her self inventory today, but she is able to verbally contract for safety with this nurse. She demonstrates little engagement into her recovery but [facility] engagement into what medications she will receive.   R Safety is in place and poc moves forward.

## 2015-02-09 NOTE — Progress Notes (Signed)
[facility] MD Progress Note  02/09/2015 5:01 PM Erin [name]  MRN:  [id number] Subjective:  Erin [name] continues to have a hard time. States her addiction got out of control she went off her medications while using. States during the holidays her birthday the super bowl she used constantly out of control. States it took to come here to stop it. State she saw herself going down really quick. She states she now has a house trough public housing, and things were going really well but that now she is afraid she might lose everything if she cant get herself back together. She has been drinking using cocaine as well as benzodiazepines. She states she is not sleeping and that Seroquel does not seem to help anymore Principal Problem: <principal problem not specified> Diagnosis:   Patient Active Problem List   Diagnosis Date Noted  . Opioid dependence [F11.20] 05/06/2014    Priority: High  . Cocaine dependence [F14.20] 07/17/2013    Priority: High  . Alcohol dependence with alcohol-induced mood disorder [F10.24]   . Bipolar affective disorder, depressed, severe [F31.4] 02/06/2015  . Suicidal ideations [R45.851]   . Thrombocytopenia [D69.6] 01/06/2013  . Sarcoidosis [D86.9] 03/30/2010  . HYPERTENSION, BENIGN ESSENTIAL [I10] 01/11/2010  . HYPOTHYROIDISM [E03.9] 03/02/2009  . ALCOHOLIC LIVER DISEASE [K24.0] 03/06/2008  . CIRRHOSIS, ALCOHOLIC, LIVER [X73.53] 29/92/4268  . TOBACCO DEPENDENCE [Z72.0] 02/22/2007  . EXTERNAL HEMORRHOIDS [K64.8] 01/11/2005  . PORTAL HYPERTENSION [K76.6] 01/11/2005   Total Time spent with patient: 30 minutes   Past Medical History:  Past Medical History  Diagnosis Date  . Hypothyroidism   . Blood transfusion July 2012  . Depression   . Anxiety   . Bipolar 1 disorder   . Menorrhagia   . Liver failure   . Bronchitis   . Cirrhosis of liver   . Wegener's granulomatosis   . History of sarcoidosis     Past Surgical History  Procedure Laterality Date  . Tubal  ligation    . Laparoscopy    . Orthopedic surgery    . Cholecystectomy    . Fracture surgery    . Hernia repair    . Vaginal hysterectomy  07/27/2011    Procedure: HYSTERECTOMY VAGINAL;  Surgeon: [name], MD;  Location: [facility];  Service: Gynecology;  Laterality: N/A;  Vaginal Hysterectomy with Right Oophorectomy  . Laparotomy  07/28/2011    Procedure: EXPLORATORY LAPAROTOMY;  Surgeon: [name], MD;  Location: [facility];  Service: Gynecology;  Laterality: N/A;   Family History:  Family History  Problem Relation Age of Onset  . Diabetes Mother   . Hypertension Mother   . Diabetes Daughter   . Hypertension Daughter    Social History:  History  Alcohol Use  . Yes    Comment: a fifth of vodka and a 12 pk a day of beer      History  Drug Use  . Yes  . Special: Cocaine, Marijuana    Comment: opioids, benzos    History   Social History  . Marital Status: Divorced    Spouse Name: N/A  . Number of Children: N/A  . Years of Education: N/A   Social History Main Topics  . Smoking status: Current Some Day Smoker -- 0.25 packs/day  . Smokeless tobacco: Never Used  . Alcohol Use: Yes     Comment: a fifth of vodka and a 12 pk a day of beer   . Drug Use: Yes    Special: Cocaine, Marijuana  Comment: opioids, benzos  . Sexual Activity: Yes    Birth Control/ Protection: None   Other Topics Concern  . None   Social History Narrative   Additional History:    Sleep: Poor  Appetite:  Poor   Assessment:   Musculoskeletal: Strength & Muscle Tone: within normal limits Gait & Station: normal Patient leans: N/A   Psychiatric Specialty Exam: Physical Exam  Review of Systems  Constitutional: Positive for malaise/fatigue.  HENT: Negative.   Eyes: Negative.   Respiratory: Negative.   Cardiovascular: Negative.   Gastrointestinal: Negative.   Genitourinary: Negative.   Musculoskeletal: Positive for myalgias and joint pain.  Skin: Negative.   Neurological:  Positive for weakness.  Endo/Heme/Allergies: Negative.   Psychiatric/Behavioral: Positive for depression and substance abuse. The patient is nervous/anxious and has insomnia.     Blood pressure 101/65, pulse 69, temperature 98 F (36.7 C), temperature source Oral, resp. rate 20, height 5\' 7"  (1.702 m), weight 109.77 kg (242 lb).Body mass index is 37.89 kg/(m^2).  General Appearance: Fairly Groomed  Engineer, water::  Fair  Speech:  Clear and Coherent  Volume:  fluctuates  Mood:  Anxious and Depressed  Affect:  anxious worried  Thought Process:  Coherent and Goal Directed  Orientation:  Full (Time, Place, and Person)  Thought Content:  symptoms events worries concerns  Suicidal Thoughts:  not right now  Homicidal Thoughts:  No  Memory:  Immediate;   Fair Recent;   Fair Remote;   Fair  Judgement:  Fair  Insight:  Present and Shallow  Psychomotor Activity:  Restlessness  Concentration:  Fair  Recall:  [employer] of Knowledge:Fair  Language: Fair  Akathisia:  No  Handed:  Right  AIMS (if indicated):     Assets:  Desire for Improvement Housing  ADL's:  Intact  Cognition: WNL  Sleep:  Number of Hours: 2     Current Medications: Current Facility-Administered Medications  Medication Dose Route Frequency Provider Last Rate Last Dose  . acetaminophen (TYLENOL) tablet 650 mg  650 mg Oral Q6H PRN Waylan Boga, NP      . albuterol (PROVENTIL HFA;VENTOLIN HFA) 108 (90 BASE) MCG/ACT inhaler 1-2 puff  1-2 puff Inhalation Q6H PRN Waylan Boga, NP      . alum & mag hydroxide-simeth (MAALOX/MYLANTA) 200-200-20 MG/5ML suspension 30 mL  30 mL Oral Q4H PRN Waylan Boga, NP      . carisoprodol (SOMA) tablet 350 mg  350 mg Oral TID Nicholaus Bloom, MD   350 mg at 02/09/15 1649  . DULoxetine (CYMBALTA) DR capsule 60 mg  60 mg Oral BID Waylan Boga, NP   60 mg at 02/09/15 1649  . hydrOXYzine (ATARAX/VISTARIL) tablet 25 mg  25 mg Oral Q6H PRN Encarnacion Slates, NP   25 mg at 02/09/15 0556  . loperamide  (IMODIUM) capsule 2-4 mg  2-4 mg Oral PRN Encarnacion Slates, NP      . LORazepam (ATIVAN) tablet 1 mg  1 mg Oral Q6H PRN Nicholaus Bloom, MD      . LORazepam (ATIVAN) tablet 1 mg  1 mg Oral QID Nicholaus Bloom, MD   1 mg at 02/09/15 1649   Followed by  . [START ON 02/10/2015] LORazepam (ATIVAN) tablet 1 mg  1 mg Oral TID Nicholaus Bloom, MD       Followed by  . [START ON 02/11/2015] LORazepam (ATIVAN) tablet 1 mg  1 mg Oral BID Nicholaus Bloom, MD  Followed by  . [START ON 02/13/2015] LORazepam (ATIVAN) tablet 1 mg  1 mg Oral Daily Nicholaus Bloom, MD      . magnesium hydroxide (MILK OF MAGNESIA) suspension 30 mL  30 mL Oral Daily PRN Waylan Boga, NP      . multivitamin with minerals tablet 1 tablet  1 tablet Oral Daily Encarnacion Slates, NP   1 tablet at 02/09/15 [phone]  . ondansetron (ZOFRAN-ODT) disintegrating tablet 4 mg  4 mg Oral Q6H PRN Encarnacion Slates, NP   4 mg at 02/09/15 0556  . propranolol (INDERAL) tablet 20 mg  20 mg Oral BID Waylan Boga, NP   20 mg at 02/09/15 1649  . QUEtiapine (SEROQUEL) tablet 400 mg  400 mg Oral QHS Nicholaus Bloom, MD      . QUEtiapine (SEROQUEL) tablet 50 mg  50 mg Oral TID Encarnacion Slates, NP   50 mg at 02/09/15 1649  . thiamine (B-1) injection 100 mg  100 mg Intramuscular Once Encarnacion Slates, NP   100 mg at 02/07/15 1330  . thiamine (VITAMIN B-1) tablet 100 mg  100 mg Oral Daily Encarnacion Slates, NP   100 mg at 02/09/15 3254  . thyroid (ARMOUR) tablet 30 mg  30 mg Oral QAC breakfast Waylan Boga, NP   30 mg at 02/09/15 9826    Lab Results: No results found for this or any previous visit (from the past 48 hour(s)).  Physical Findings: AIMS: Facial and Oral Movements Muscles of Facial Expression: None, normal Lips and Perioral Area: None, normal Jaw: None, normal Tongue: None, normal,Extremity Movements Upper (arms, wrists, hands, fingers): None, normal Lower (legs, knees, ankles, toes): None, normal, Trunk Movements Neck, shoulders, hips: None, normal, Overall  Severity Severity of abnormal movements (highest score from questions above): None, normal Incapacitation due to abnormal movements: None, normal Patient's awareness of abnormal movements (rate only patient's report): No Awareness, Dental Status Current problems with teeth and/or dentures?: No Does patient usually wear dentures?: No  CIWA:  CIWA-Ar Total: 1 COWS:  COWS Total Score: 10  Treatment Plan Summary: Daily contact with patient to assess and evaluate symptoms and progress in treatment and Medication management Supportive approach/coping skills/relapse prevention Alcohol/Benzodiazepine Abuse-Dependence; will detox with the Ativan detox protocol Insomnia: will increase the Seroquel to 400 mg HS Mood instability; will continue the Seroquel 50 mg TID and increase the HS dose to 400 mg Major Depression: will continue the Cymbalta reassess for a possible increase   Medical Decision Making:  Review of Psycho-Social Stressors (1), Review or order clinical lab tests (1), Review of Medication Regimen & Side Effects (2) and Review of New Medication or Change in Dosage (2)     Jayonna Meyering A 02/09/2015, 5:01 PM

## 2015-02-09 NOTE — Progress Notes (Signed)
Patient ID: Erin [name], female   DOB: [birth year], 49 y.o.   MRN: [id number] PER STATE REGULATIONS 482.30  THIS CHART WAS REVIEWED FOR MEDICAL NECESSITY WITH RESPECT TO THE PATIENT'S ADMISSION/DURATION OF STAY.  NEXT REVIEW DATE: 02/10/15  Roma Schanz, RN, BSN CASE MANAGER

## 2015-02-09 NOTE — BHH Group Notes (Signed)
Adult Psychoeducational Group Note  Date:  02/08/15 Time:  2000   Group Topic/Focus:  Wrap-up Group  Participation Level:  Active  Participation Quality:  Drowsy  Affect:  Appropriate  Cognitive:  Appropriate  Insight: Appropriate  Engagement in Group:  Engaged  Modes of Intervention:  Discussion and Support  Additional Comments: Pt states that her day consisted of being tired and boring and she slept most of the day because she could not sleep last night as result of nightmares she had.  States she had a rough day.   Rick Duff Coursey 02/09/2015, 12:20 AM

## 2015-02-09 NOTE — BHH Group Notes (Signed)
[facility] LCSW Group Therapy  02/09/2015 3:36 PM  Type of Therapy:  Group Therapy  Participation Level:  Did Not Attend-pt invited/in room resting.   Summary of Progress/Problems: Today's Topic: Overcoming Obstacles. Pt identified obstacles faced currently and processed barriers involved in overcoming these obstacles. Pt identified steps necessary for overcoming these obstacles and explored motivation (internal and external) for facing these difficulties head on. Pt further identified one area of concern in their lives and chose a skill of focus pulled from their "toolbox."   Smart, [place]  02/09/2015, 3:36 PM

## 2015-02-09 NOTE — Tx Team (Signed)
Interdisciplinary Treatment Plan Update (Adult)   Date: 02/09/2015   Time Reviewed: 11:24 AM  Progress in Treatment:  Attending groups: Yes  Participating in groups:  Yes  Taking medication as prescribed: Yes  Tolerating medication: Yes  Family/Significant othe contact made: Not yet. SPE required for this pt. Pt also requesting family session/CSW assessing.   Patient understands diagnosis: Yes, AEB seeking treatment for ETOH detox, crack cocaine abuse, passive SI/Depression/mood instability, and for medication stabilization.  Discussing patient identified problems/goals with staff: Yes  Medical problems stabilized or resolved: Yes  Denies suicidal/homicidal ideation: Yes during group/self report.  Patient has not harmed self or Others: Yes  New problem(s) identified:  Discharge Plan or Barriers: Pt would like to detox, stabilize, and return home. Pt and CSW met to discuss aftercare-decided on ADS for med management and assessment for SAIOP/individual therapy. Pt given list of AA meetings and encouraged to begin attending. Pt also asking for [employer] info "I can get in free with medicaid and medicare and need something to do like that." CSW assessing. Pt reporting poor sleep currently and VH due to lack of sleep. Minimal withdrawals reported.  Additional comments: Erin [name] is a 49 year old Caucasian female. Admitted to [facility] from the [facility]. She reports, "The police took me to the hospital ED because I wanted to sign myself in to the hospital. I have been binging on alcohol & cocaine everyday since Christmas. I need to stop. I need help getting into some rehab treatment. I was sober x 12 months prior to relapsing at Christmas. The holidays did me in. I did not have any family to celebrate Christmas with, so I used drugs and drank alcohol to help forget my problems. Right now, my body is doing a lot of shaking & twitching. I feel very anxious. Right now, my depression is at #9 and anxiety at  #8". Reason for Continuation of Hospitalization: Ativan taper-withdrawals Mood stabilization/depression Medication management VH Estimated length of stay: 3-5 days  For review of initial/current patient goals, please see plan of care.  Attendees:  Patient:    Family:    Physician: Carlton Adam MD 02/09/2015 11:26 AM   Nursing: Chong Sicilian RN 02/09/2015 11:27 AM   Clinical Social Worker [place], [place]  02/09/2015  11:27 AM   Other: Reginold AgentErasmo Downer LCSWA 02/09/2015 11:27 AM   Other: 02/09/2015 11:27 AM   Other: 02/09/2015 11:27 AM   Other:    Scribe for Treatment Team:  [place] LCSWA 02/09/2015 11:27 AM

## 2015-02-09 NOTE — BHH Group Notes (Signed)
[facility] LCSW Aftercare Discharge Planning Group Note   02/09/2015 9:40 AM  Participation Quality:  Appropriate   Mood/Affect:  Appropriate  Depression Rating:  2-3  Anxiety Rating:  2-3  Thoughts of Suicide:  No Will you contract for safety?   NA  Current AVH:  Yes-pt reports seeing blurry visions and thinks it has to do with her lack of sleep.   Plan for Discharge/Comments:  Pt reports that she relapsed in late Nov on alcohol and crack cocaine. Med noncompliance since late November. Pt plans to detox and return home-interested in going back to [employer]. CSW assessing.   Transportation Means: bus   Supports: "I have a significant other nowAflac Incorporated, [employer]

## 2015-02-10 MED ORDER — CLONAZEPAM 1 MG PO TABS: 1.0000 mg | ORAL_TABLET | Freq: Two times a day (BID) | ORAL | Status: DC | PRN

## 2015-02-10 MED ORDER — OLANZAPINE 5 MG PO TBDP
15.0000 mg | ORAL_TABLET | Freq: Every day | ORAL | Status: DC
  Administered 2015-02-10: 15 mg via ORAL
  Filled 2015-02-10 (×4): qty 1

## 2015-02-10 MED ORDER — CLONAZEPAM 1 MG PO TABS: 1.0000 mg | ORAL_TABLET | Freq: Every day | ORAL | Status: DC

## 2015-02-10 MED ORDER — CLONAZEPAM 1 MG PO TABS
1.0000 mg | ORAL_TABLET | Freq: Every evening | ORAL | Status: DC | PRN
  Administered 2015-02-10 – 2015-02-11 (×2): 1 mg via ORAL
  Filled 2015-02-10 (×2): qty 1

## 2015-02-10 NOTE — BHH Group Notes (Signed)
[facility] Group Notes:  (Nursing/MHT/Case Management/Adjunct)  Date:  02/10/2015  Time:  0900am  Type of Therapy:  Nurse Education  Participation Level:  Active  Participation Quality:  Intrusive  Affect:  Appropriate  Cognitive:  Disorganized  Insight:  Lacking  Engagement in Group:  Engaged  Modes of Intervention:  Discussion, Education and Support  Summary of Progress/Problems: Patient attended group and responded when prompted. Pt was tangential and intrusive at times but redirectable. Pt reports her goal for the day is to "make it through and get some meds to sleep, I'm exhausted." Pt reports she only slept "3-4 hours" last night.  Charlyne Quale A 02/10/2015, 11:21 AM

## 2015-02-10 NOTE — Progress Notes (Signed)
Patient ID: Erin [name], female   DOB: January 20, 1966, 49 y.o.   MRN: [id number] PER STATE REGULATIONS 482.30  THIS CHART WAS REVIEWED FOR MEDICAL NECESSITY WITH RESPECT TO THE PATIENT'S ADMISSION/ DURATION OF STAY.  NEXT REVIEW DATE:  02/14/2015 Chauncy Lean, RN, BSN CASE MANAGER

## 2015-02-10 NOTE — Progress Notes (Signed)
D: Patient's affect appropriate to situation and mood is depressed. She reported on the self inventory sheet that her sleep and ability to concentrate are poor, appetite is fair and energy level is low. Patient rates depression/feelings of hopelessness "2" and anxiety "8". She's sitting in the dayroom conversing with peers and participating in group sessions throughout the day. Patient is compliant with medication regimen.  A: Support and encouragement provided to patient. Scheduled medications given per MD orders. Maintain Q15 minute checks for safety.  R: Patient receptive. Denies SI/HI and auditory/visual hallucinations. Patient remains safe on the hall.

## 2015-02-10 NOTE — BHH Group Notes (Signed)
[place] LCSW Group Therapy  02/10/2015 1:23 PM  Type of Therapy:  Group Therapy  Participation Level:  Minimal  Participation Quality:  Inattentive  Affect:  Flat  Cognitive:  Lacking  Insight:  Limited  Engagement in Therapy:  Limited  Modes of Intervention:  Discussion, Education, Exploration, Problem-solving, Rapport Building, Socialization and Support  Summary of Progress/Problems: [facility] Speaker came to talk about his personal journey with substance abuse and addiction. The pt processed ways by which to relate to the speaker. [place] speaker provided handouts and educational information pertaining to groups and services offered by the [facility].   Smart, Kenyen Candy LCSWA 02/10/2015, 1:23 PM

## 2015-02-10 NOTE — Progress Notes (Signed)
[facility] MD Progress Note  02/10/2015 6:37 PM Erin [name]  MRN:  [id number] Subjective:  Erin [name] states she is still not feeling well. She states she did not sleep well last night and today she has been more tired, sleepy wanting to stay in bed. She is still not feeling stable. She admits she has been off her medications for a long time and realizes it is going to get some time before she gets to feel better again. States she was using Klonopin and out of all medications Klonopin was the most effective for the anxiety and the insomnia. We have used Zyprexa last time that she was here Principal Problem: <principal problem not specified> Diagnosis:   Patient Active Problem List   Diagnosis Date Noted  . Alcohol dependence with alcohol-induced mood disorder [F10.24]     Priority: High  . Bipolar affective disorder, depressed, severe [F31.4] 02/06/2015    Priority: High  . Opioid dependence [F11.20] 05/06/2014    Priority: High  . Cocaine dependence [F14.20] 07/17/2013    Priority: High  . HYPOTHYROIDISM [E03.9] 03/02/2009    Priority: High  . Bipolar disorder [F31.9] 02/09/2015  . Suicidal ideations [R45.851]   . Thrombocytopenia [D69.6] 01/06/2013  . Sarcoidosis [D86.9] 03/30/2010  . HYPERTENSION, BENIGN ESSENTIAL [I10] 01/11/2010  . ALCOHOLIC LIVER DISEASE [I33.8] 03/06/2008  . CIRRHOSIS, ALCOHOLIC, LIVER [S50.53] 97/67/3419  . TOBACCO DEPENDENCE [Z72.0] 02/22/2007  . EXTERNAL HEMORRHOIDS [K64.8] 01/11/2005  . PORTAL HYPERTENSION [K76.6] 01/11/2005   Total Time spent with patient: 30 minutes   Past Medical History:  Past Medical History  Diagnosis Date  . Hypothyroidism   . Blood transfusion July 2012  . Depression   . Anxiety   . Bipolar 1 disorder   . Menorrhagia   . Liver failure   . Bronchitis   . Cirrhosis of liver   . Wegener's granulomatosis   . History of sarcoidosis     Past Surgical History  Procedure Laterality Date  . Tubal ligation    . Laparoscopy    .  Orthopedic surgery    . Cholecystectomy    . Fracture surgery    . Hernia repair    . Vaginal hysterectomy  07/27/2011    Procedure: HYSTERECTOMY VAGINAL;  Surgeon: [name], MD;  Location: [facility];  Service: Gynecology;  Laterality: N/A;  Vaginal Hysterectomy with Right Oophorectomy  . Laparotomy  07/28/2011    Procedure: EXPLORATORY LAPAROTOMY;  Surgeon: [name], MD;  Location: [facility];  Service: Gynecology;  Laterality: N/A;   Family History:  Family History  Problem Relation Age of Onset  . Diabetes Mother   . Hypertension Mother   . Diabetes Daughter   . Hypertension Daughter    Social History:  History  Alcohol Use  . Yes    Comment: a fifth of vodka and a 12 pk a day of beer      History  Drug Use  . Yes  . Special: Cocaine, Marijuana    Comment: opioids, benzos    History   Social History  . Marital Status: Divorced    Spouse Name: N/A  . Number of Children: N/A  . Years of Education: N/A   Social History Main Topics  . Smoking status: Current Some Day Smoker -- 0.25 packs/day  . Smokeless tobacco: Never Used  . Alcohol Use: Yes     Comment: a fifth of vodka and a 12 pk a day of beer   . Drug Use: Yes  Special: Cocaine, Marijuana     Comment: opioids, benzos  . Sexual Activity: Yes    Birth Control/ Protection: None   Other Topics Concern  . None   Social History Narrative   Additional History:    Sleep: Poor  Appetite:  Poor   Assessment:   Musculoskeletal: Strength & Muscle Tone: within normal limits Gait & Station: normal Patient leans: N/A   Psychiatric Specialty Exam: Physical Exam  Review of Systems  Constitutional: Positive for malaise/fatigue.  HENT: Negative.   Eyes: Negative.   Respiratory: Negative.   Cardiovascular: Negative.   Gastrointestinal: Negative.   Genitourinary: Negative.   Musculoskeletal: Positive for joint pain.  Skin: Negative.   Neurological: Positive for weakness.  Endo/Heme/Allergies:  Negative.   Psychiatric/Behavioral: Positive for depression and substance abuse. The patient is nervous/anxious and has insomnia.     Blood pressure 105/89, pulse 59, temperature 97.5 F (36.4 C), temperature source Oral, resp. rate 20, height 5\' 7"  (1.702 m), weight 109.77 kg (242 lb).Body mass index is 37.89 kg/(m^2).  General Appearance: Fairly Groomed  Engineer, water::  Fair  Speech:  Clear and Coherent  Volume:  Normal  Mood:  Anxious, Depressed, Dysphoric and worried  Affect:  Restricted  Thought Process:  Coherent and Goal Directed  Orientation:  Full (Time, Place, and Person)  Thought Content:  symptoms events worries concerns  Suicidal Thoughts:  No  Homicidal Thoughts:  No  Memory:  Immediate;   Fair Recent;   Fair Remote;   Fair  Judgement:  Fair  Insight:  Present and Shallow  Psychomotor Activity:  Restlessness  Concentration:  Fair  Recall:  [employer] of Knowledge:Fair  Language: Fair  Akathisia:  No  Handed:  Right  AIMS (if indicated):     Assets:  Desire for Improvement Housing  ADL's:  Intact  Cognition: WNL  Sleep:  Number of Hours: 3.5     Current Medications: Current Facility-Administered Medications  Medication Dose Route Frequency Provider Last Rate Last Dose  . acetaminophen (TYLENOL) tablet 650 mg  650 mg Oral Q6H PRN Waylan Boga, NP      . albuterol (PROVENTIL HFA;VENTOLIN HFA) 108 (90 BASE) MCG/ACT inhaler 1-2 puff  1-2 puff Inhalation Q6H PRN Waylan Boga, NP      . alum & mag hydroxide-simeth (MAALOX/MYLANTA) 200-200-20 MG/5ML suspension 30 mL  30 mL Oral Q4H PRN Waylan Boga, NP      . carisoprodol (SOMA) tablet 350 mg  350 mg Oral TID Nicholaus Bloom, MD   350 mg at 02/10/15 1647  . clonazePAM (KLONOPIN) tablet 1 mg  1 mg Oral QHS PRN Nicholaus Bloom, MD      . DULoxetine (CYMBALTA) DR capsule 60 mg  60 mg Oral BID Waylan Boga, NP   60 mg at 02/10/15 1647  . LORazepam (ATIVAN) tablet 1 mg  1 mg Oral Q6H PRN Nicholaus Bloom, MD   1 mg at 02/10/15  0344  . LORazepam (ATIVAN) tablet 1 mg  1 mg Oral TID Nicholaus Bloom, MD   1 mg at 02/10/15 1647   Followed by  . [START ON 02/11/2015] LORazepam (ATIVAN) tablet 1 mg  1 mg Oral BID Nicholaus Bloom, MD       Followed by  . [START ON 02/13/2015] LORazepam (ATIVAN) tablet 1 mg  1 mg Oral Daily Nicholaus Bloom, MD      . magnesium hydroxide (MILK OF MAGNESIA) suspension 30 mL  30 mL Oral Daily PRN  Waylan Boga, NP      . multivitamin with minerals tablet 1 tablet  1 tablet Oral Daily Encarnacion Slates, NP   1 tablet at 02/10/15 0830  . OLANZapine zydis (ZYPREXA) disintegrating tablet 15 mg  15 mg Oral QHS Nicholaus Bloom, MD      . propranolol (INDERAL) tablet 20 mg  20 mg Oral BID Waylan Boga, NP   20 mg at 02/10/15 1647  . thiamine (B-1) injection 100 mg  100 mg Intramuscular Once Encarnacion Slates, NP   100 mg at 02/07/15 1330  . thiamine (VITAMIN B-1) tablet 100 mg  100 mg Oral Daily Encarnacion Slates, NP   100 mg at 02/10/15 0830  . thyroid (ARMOUR) tablet 30 mg  30 mg Oral QAC breakfast Waylan Boga, NP   30 mg at 02/10/15 1601    Lab Results: No results found for this or any previous visit (from the past 48 hour(s)).  Physical Findings: AIMS: Facial and Oral Movements Muscles of Facial Expression: None, normal Lips and Perioral Area: None, normal Jaw: None, normal Tongue: None, normal,Extremity Movements Upper (arms, wrists, hands, fingers): None, normal Lower (legs, knees, ankles, toes): None, normal, Trunk Movements Neck, shoulders, hips: None, normal, Overall Severity Severity of abnormal movements (highest score from questions above): None, normal Incapacitation due to abnormal movements: None, normal Patient's awareness of abnormal movements (rate only patient's report): No Awareness, Dental Status Current problems with teeth and/or dentures?: No Does patient usually wear dentures?: No  CIWA:  CIWA-Ar Total: 2 COWS:  COWS Total Score: 10  Treatment Plan Summary: Daily contact with patient to  assess and evaluate symptoms and progress in treatment and Medication management Supportive approach/coping skills Alcohol Dependence; Ativan detox protocol, relapse prevention plan Mood instability: will have a trial with Zyprexa what seems to have worked last time she was here Insomnia: will use the Zyprexa combined with a Klonopin 1 mg at [facility]    Medical Decision Making:  Review of Psycho-Social Stressors (1), Review or order clinical lab tests (1), Review of Medication Regimen & Side Effects (2) and Review of New Medication or Change in Dosage (2)     Erin [name] A 02/10/2015, 6:37 PM

## 2015-02-11 DIAGNOSIS — F1420 Cocaine dependence, uncomplicated: Secondary | ICD-10-CM

## 2015-02-11 MED ORDER — TRAZODONE HCL 50 MG PO TABS
50.0000 mg | ORAL_TABLET | Freq: Every evening | ORAL | Status: DC | PRN
  Administered 2015-02-11 – 2015-02-13 (×5): 50 mg via ORAL
  Filled 2015-02-11 (×10): qty 1

## 2015-02-11 MED ORDER — OLANZAPINE 10 MG PO TBDP
20.0000 mg | ORAL_TABLET | Freq: Every day | ORAL | Status: DC
  Administered 2015-02-11: 20 mg via ORAL
  Filled 2015-02-11 (×4): qty 2

## 2015-02-11 MED ORDER — TRAZODONE HCL 50 MG PO TABS
50.0000 mg | ORAL_TABLET | Freq: Every evening | ORAL | Status: DC | PRN
  Filled 2015-02-11: qty 1

## 2015-02-11 NOTE — BHH Group Notes (Signed)
[facility] LCSW Group Therapy  02/11/2015 3:32 PM  Type of Therapy:  Group Therapy  Participation Level:  Active  Participation Quality:  Attentive  Affect:  Appropriate  Cognitive:  Oriented  Insight:  Limited  Engagement in Therapy:  Improving  Modes of Intervention:  Confrontation, Discussion, Education, Exploration, Problem-solving, Rapport Building, Socialization and Support  Summary of Progress/Problems: Today's Topic: Overcoming Obstacles. Pt identified obstacles faced currently and processed barriers involved in overcoming these obstacles. Pt identified steps necessary for overcoming these obstacles and explored motivation (internal and external) for facing these difficulties head on. Pt further identified one area of concern in their lives and chose a skill of focus pulled from their "toolbox." Erin [name] was attentive and engaged during today's processing group. She shared that her biggest obstacle involves getting out of her house and staying active. She identified boredom and loneliness as two triggers that lead her to relapse. Erin [name] shows improving insight and progress in the group setting AEB her ability to identify realistic ways to stay busy "join the [facility], go to meetings, and go to IOP and meet new people doing those things." Erin [name] stated that getting her health back was a goal for her in addition to maintaining sobriety and talkd about ways that she can improve her self care [facility] and eating healthier.   Smart, Farzad Tibbetts LCSWA  02/11/2015, 3:32 PM

## 2015-02-11 NOTE — Progress Notes (Signed)
[facility] MD Progress Note  02/11/2015 12:07 PM Erin [name]  MRN:  [id number] Subjective:  Erin [name] states that she continues to have a hard time with sleep. She states she remained awake most of the night. In the morning she is tired, feeling fatigued.  States she does not feel safe outside of here. Her mood is still unstable. States she feels she goes up and down States that she is also dealing with her chronic pain and is scared about the progression of her sarcoidosis. When she starts thinking about all these things she feels completely hopeless, helpless and starts thinking about suicide. She feel pretty much isolated as when she started using this last time her BF told her he did not want to be a witness to her destroying herself so he left.  Principal Problem: <principal problem not specified> Diagnosis:   Patient Active Problem List   Diagnosis Date Noted  . Alcohol dependence with alcohol-induced mood disorder [F10.24]     Priority: High  . Bipolar affective disorder, depressed, severe [F31.4] 02/06/2015    Priority: High  . Opioid dependence [F11.20] 05/06/2014    Priority: High  . Cocaine dependence [F14.20] 07/17/2013    Priority: High  . HYPOTHYROIDISM [E03.9] 03/02/2009    Priority: High  . Bipolar disorder [F31.9] 02/09/2015  . Suicidal ideations [R45.851]   . Thrombocytopenia [D69.6] 01/06/2013  . Sarcoidosis [D86.9] 03/30/2010  . HYPERTENSION, BENIGN ESSENTIAL [I10] 01/11/2010  . ALCOHOLIC LIVER DISEASE [E17.4] 03/06/2008  . CIRRHOSIS, ALCOHOLIC, LIVER [Y81.44] 81/85/6314  . TOBACCO DEPENDENCE [Z72.0] 02/22/2007  . EXTERNAL HEMORRHOIDS [K64.8] 01/11/2005  . PORTAL HYPERTENSION [K76.6] 01/11/2005   Total Time spent with patient: 30 minutes   Past Medical History:  Past Medical History  Diagnosis Date  . Hypothyroidism   . Blood transfusion July 2012  . Depression   . Anxiety   . Bipolar 1 disorder   . Menorrhagia   . Liver failure   . Bronchitis   . Cirrhosis  of liver   . Wegener's granulomatosis   . History of sarcoidosis     Past Surgical History  Procedure Laterality Date  . Tubal ligation    . Laparoscopy    . Orthopedic surgery    . Cholecystectomy    . Fracture surgery    . Hernia repair    . Vaginal hysterectomy  07/27/2011    Procedure: HYSTERECTOMY VAGINAL;  Surgeon: [name], MD;  Location: [facility];  Service: Gynecology;  Laterality: N/A;  Vaginal Hysterectomy with Right Oophorectomy  . Laparotomy  07/28/2011    Procedure: EXPLORATORY LAPAROTOMY;  Surgeon: [name], MD;  Location: [facility];  Service: Gynecology;  Laterality: N/A;   Family History:  Family History  Problem Relation Age of Onset  . Diabetes Mother   . Hypertension Mother   . Diabetes Daughter   . Hypertension Daughter    Social History:  History  Alcohol Use  . Yes    Comment: a fifth of vodka and a 12 pk a day of beer      History  Drug Use  . Yes  . Special: Cocaine, Marijuana    Comment: opioids, benzos    History   Social History  . Marital Status: Divorced    Spouse Name: N/A  . Number of Children: N/A  . Years of Education: N/A   Social History Main Topics  . Smoking status: Current Some Day Smoker -- 0.25 packs/day  . Smokeless tobacco: Never Used  . Alcohol  Use: Yes     Comment: a fifth of vodka and a 12 pk a day of beer   . Drug Use: Yes    Special: Cocaine, Marijuana     Comment: opioids, benzos  . Sexual Activity: Yes    Birth Control/ Protection: None   Other Topics Concern  . None   Social History Narrative   Additional History:    Sleep: Poor  Appetite:  Poor   Assessment:   Musculoskeletal: Strength & Muscle Tone: within normal limits Gait & Station: normal Patient leans: N/A   Psychiatric Specialty Exam: Physical Exam  Review of Systems  Constitutional: Positive for malaise/fatigue.  HENT: Negative.   Eyes: Negative.   Respiratory: Negative.   Cardiovascular: Negative.   Gastrointestinal:  Negative.   Genitourinary: Negative.   Musculoskeletal: Positive for joint pain.  Skin: Negative.   Neurological: Positive for weakness.  Endo/Heme/Allergies: Negative.   Psychiatric/Behavioral: Positive for depression and substance abuse. The patient is nervous/anxious and has insomnia.     Blood pressure 107/68, pulse 59, temperature 97.7 F (36.5 C), temperature source Oral, resp. rate 19, height 5\' 7"  (1.702 m), weight 109.77 kg (242 lb).Body mass index is 37.89 kg/(m^2).  General Appearance: Fairly Groomed  Engineer, water::  Fair  Speech:  Clear and Coherent  Volume:  Decreased  Mood:  Anxious, Depressed and Dysphoric  Affect:  Labile and Tearful  Thought Process:  Coherent and Goal Directed  Orientation:  Full (Time, Place, and Person)  Thought Content:  symptoms events worries concerns  Suicidal Thoughts:  intermittent  Homicidal Thoughts:  No  Memory:  Immediate;   Fair Recent;   Fair Remote;   Fair  Judgement:  Fair  Insight:  Present and Shallow  Psychomotor Activity:  Restlessness  Concentration:  Fair  Recall:  [employer] of Knowledge:Fair  Language: Fair  Akathisia:  No  Handed:  Right  AIMS (if indicated):     Assets:  Desire for Improvement Housing  ADL's:  Intact  Cognition: WNL  Sleep:  Number of Hours: 3.5     Current Medications: Current Facility-Administered Medications  Medication Dose Route Frequency Provider Last Rate Last Dose  . acetaminophen (TYLENOL) tablet 650 mg  650 mg Oral Q6H PRN Waylan Boga, NP      . albuterol (PROVENTIL HFA;VENTOLIN HFA) 108 (90 BASE) MCG/ACT inhaler 1-2 puff  1-2 puff Inhalation Q6H PRN Waylan Boga, NP      . alum & mag hydroxide-simeth (MAALOX/MYLANTA) 200-200-20 MG/5ML suspension 30 mL  30 mL Oral Q4H PRN Waylan Boga, NP      . carisoprodol (SOMA) tablet 350 mg  350 mg Oral TID Nicholaus Bloom, MD   350 mg at 02/11/15 1203  . clonazePAM (KLONOPIN) tablet 1 mg  1 mg Oral QHS PRN Nicholaus Bloom, MD   1 mg at 02/10/15  2116  . DULoxetine (CYMBALTA) DR capsule 60 mg  60 mg Oral BID Waylan Boga, NP   60 mg at 02/11/15 0800  . LORazepam (ATIVAN) tablet 1 mg  1 mg Oral Q6H PRN Nicholaus Bloom, MD   1 mg at 02/10/15 2322  . LORazepam (ATIVAN) tablet 1 mg  1 mg Oral BID Nicholaus Bloom, MD       Followed by  . [START ON 02/13/2015] LORazepam (ATIVAN) tablet 1 mg  1 mg Oral Daily Nicholaus Bloom, MD      . magnesium hydroxide (MILK OF MAGNESIA) suspension 30 mL  30 mL Oral  Daily PRN Waylan Boga, NP      . multivitamin with minerals tablet 1 tablet  1 tablet Oral Daily Encarnacion Slates, NP   1 tablet at 02/11/15 0801  . OLANZapine zydis (ZYPREXA) disintegrating tablet 20 mg  20 mg Oral QHS Nicholaus Bloom, MD      . propranolol (INDERAL) tablet 20 mg  20 mg Oral BID Waylan Boga, NP   20 mg at 02/11/15 0800  . thiamine (B-1) injection 100 mg  100 mg Intramuscular Once Encarnacion Slates, NP   100 mg at 02/07/15 1330  . thiamine (VITAMIN B-1) tablet 100 mg  100 mg Oral Daily Encarnacion Slates, NP   100 mg at 02/11/15 0801  . thyroid (ARMOUR) tablet 30 mg  30 mg Oral QAC breakfast Waylan Boga, NP   30 mg at 02/11/15 8416  . traZODone (DESYREL) tablet 50 mg  50 mg Oral QHS,MR X 1 Spencer E Simon, PA-C   50 mg at 02/11/15 6063    Lab Results: No results found for this or any previous visit (from the past 106 hour(s)).  Physical Findings: AIMS: Facial and Oral Movements Muscles of Facial Expression: None, normal Lips and Perioral Area: None, normal Jaw: None, normal Tongue: None, normal,Extremity Movements Upper (arms, wrists, hands, fingers): None, normal Lower (legs, knees, ankles, toes): None, normal, Trunk Movements Neck, shoulders, hips: None, normal, Overall Severity Severity of abnormal movements (highest score from questions above): None, normal Incapacitation due to abnormal movements: None, normal Patient's awareness of abnormal movements (rate only patient's report): No Awareness, Dental Status Current problems with teeth  and/or dentures?: No Does patient usually wear dentures?: No  CIWA:  CIWA-Ar Total: 2 COWS:  COWS Total Score: 10  Treatment Plan Summary: Daily contact with patient to assess and evaluate symptoms and progress in treatment and Medication management Supportive approach/coping skills Alcohol dependence: Pursue detox with Ativan, work a relapse prevention plan, explore the use of medications for cravings Cocaine Dependence: monitor the mood instability coming from withdrawal of cocaine Mood instability: will continue to work with the Zyprexa. Will increase the bedtime dose to 20 mg Zydis Insomnia (severe) will increase the Zyprexa Zydis to 20 mg HS and work with one mg of klonopin at night. Will reinforce sleep hygiene, will encourage not to go to bed during the day although feeling tired from not sleeping the night before.  Chronic pain: will pursue the soma as states other than the Oxy's this is the only medication that works for her pain Will address the safety concerns  Medical Decision Making:  Review of Psycho-Social Stressors (1), Review or order clinical lab tests (1), Review of Medication Regimen & Side Effects (2) and Review of New Medication or Change in Dosage (2)     Pete Schnitzer A 02/11/2015, 12:07 PM

## 2015-02-11 NOTE — Progress Notes (Signed)
Pt alert and cooperative. Affect/mood anxious, depressed and labile. "I'm kinda anxious because I've been up all day. I haven't slept well for days".  -SI/HI, -A/[place], verbally contracts for safety. Emotional support and encouragement given. Will continue to monitor closely and evaluate for stabilization.

## 2015-02-11 NOTE — Plan of Care (Signed)
Problem: Ineffective individual coping Goal: STG: Patient will remain free from self harm Outcome: Progressing Pt safe on the unit aeb asessments  Problem: Alteration in mood & ability to function due to Goal: LTG-Pt reports reduction in suicidal thoughts (Patient reports reduction in suicidal thoughts and is able to verbalize a safety plan for whenever patient is feeling suicidal)  Pt denies SI at this time

## 2015-02-11 NOTE — Progress Notes (Signed)
Pt stated that she stayed out of day all day and hopes to get some sleep tonight.

## 2015-02-11 NOTE — BHH Group Notes (Signed)
[facility] LCSW Aftercare Discharge Planning Group Note   02/11/2015 9:48 AM  Participation Quality:  Appropriate   Mood/Affect:  Appropriate  Depression Rating:  High   Anxiety Rating:  High "because I haven't slept last night."   Thoughts of Suicide:  No Will you contract for safety?   NA  Current AVH:  No  Plan for Discharge/Comments:  Pt reports that she wants help getting set up with mediciad transport and [employer] "So I have something to do every day." Pt also asking for dentist referral-CSW informed her that we can give her some resources but she would have to follow-up and call around. Pt will follow up with her PCP-refusing to give consent for [place] to contact "I don't want him knowing I was here because he might drop me." Pt to follow-up at [facility] at d/c for [facility] IOP assessment. Pt plans to return home at d/c.   Transportation Means: bus   Supports: boyfriend  Proofreader, Research officer, trade union

## 2015-02-11 NOTE — Progress Notes (Signed)
D: Patient has appropriate affect and depressed mood. She reported on the self inventory sheet that she slept poorly, low energy level and poor appetite and ability to concentrate. Patient rates depression "7", feelings of hopelessness "6" and anxiety "9". Continues to participate in groups. Adheres to all medications and tolerating them well.  A: Support and encouragement provided to patient. Administered medications per ordering MD. Monitor Q15 minute checks for safety.  R: Patient receptive. Denies SI/HI/AVH. Patient remains safe on the unit.

## 2015-02-11 NOTE — Progress Notes (Signed)
D: Pt denies SI/HI/AVH. Pt is pleasant and cooperative. Pt appears to show med seeking behaviors. Pt complained of not being able to sleep. Pt was given all medication available.   A: Pt was offered support and encouragement. Pt was given scheduled medications. Pt was encourage to attend groups. Q 15 minute checks were done for safety.   R:Pt attends groups and interacts well with peers and staff. Pt is taking medication.Pt receptive to treatment and safety maintained on unit.

## 2015-02-12 MED ORDER — BENZONATATE 100 MG PO CAPS
100.0000 mg | ORAL_CAPSULE | Freq: Three times a day (TID) | ORAL | Status: DC | PRN
  Administered 2015-02-12 – 2015-02-15 (×4): 100 mg via ORAL
  Filled 2015-02-12 (×3): qty 1

## 2015-02-12 MED ORDER — CLONAZEPAM 1 MG PO TABS
1.0000 mg | ORAL_TABLET | Freq: Every day | ORAL | Status: DC
  Administered 2015-02-12 – 2015-02-16 (×5): 1 mg via ORAL
  Filled 2015-02-12 (×5): qty 1

## 2015-02-12 MED ORDER — LORATADINE 10 MG PO TABS
10.0000 mg | ORAL_TABLET | Freq: Every day | ORAL | Status: DC
  Administered 2015-02-12 – 2015-02-17 (×6): 10 mg via ORAL
  Filled 2015-02-12 (×8): qty 1

## 2015-02-12 MED ORDER — LORATADINE 10 MG PO TABS
ORAL_TABLET | ORAL | Status: AC
Start: 2015-02-12 — End: 2015-02-12
  Filled 2015-02-12: qty 1

## 2015-02-12 MED ORDER — BENZONATATE 100 MG PO CAPS
ORAL_CAPSULE | ORAL | Status: AC
  Filled 2015-02-12: qty 1

## 2015-02-12 MED ORDER — QUETIAPINE FUMARATE 400 MG PO TABS
400.0000 mg | ORAL_TABLET | Freq: Every day | ORAL | Status: DC
  Administered 2015-02-12 – 2015-02-16 (×5): 400 mg via ORAL
  Filled 2015-02-12 (×6): qty 1
  Filled 2015-02-12: qty 8

## 2015-02-12 NOTE — Progress Notes (Signed)
Pt did not attend karaoke group this evening.  

## 2015-02-12 NOTE — Progress Notes (Signed)
[facility] MD Progress Note  02/12/2015 2:17 PM Erin [name]  MRN:  [id number] Subjective:  Erin [name] was reported not to have slept at all last night. She did all the right things during the day: she staid up, went to groups, did not go back to bed all day and still did not sleep. States she is tired, exhausted. States that if she cant sleep she is not going to be able to function. Afraid that she would go back to drinking to be able to sleep. She states she is getting to feel hopeless helpless. Her mood is unstable and she does not feel safe out of the structure the hospital offers.  Principal Problem: <principal problem not specified> Diagnosis:   Patient Active Problem List   Diagnosis Date Noted  . Alcohol dependence with alcohol-induced mood disorder [F10.24]     Priority: High  . Bipolar affective disorder, depressed, severe [F31.4] 02/06/2015    Priority: High  . Opioid dependence [F11.20] 05/06/2014    Priority: High  . Cocaine dependence [F14.20] 07/17/2013    Priority: High  . HYPOTHYROIDISM [E03.9] 03/02/2009    Priority: High  . Bipolar disorder [F31.9] 02/09/2015  . Suicidal ideations [R45.851]   . Thrombocytopenia [D69.6] 01/06/2013  . Sarcoidosis [D86.9] 03/30/2010  . HYPERTENSION, BENIGN ESSENTIAL [I10] 01/11/2010  . ALCOHOLIC LIVER DISEASE [W10.9] 03/06/2008  . CIRRHOSIS, ALCOHOLIC, LIVER [N23.55] 73/22/0254  . TOBACCO DEPENDENCE [Z72.0] 02/22/2007  . EXTERNAL HEMORRHOIDS [K64.8] 01/11/2005  . PORTAL HYPERTENSION [K76.6] 01/11/2005   Total Time spent with patient: 30 minutes   Past Medical History:  Past Medical History  Diagnosis Date  . Hypothyroidism   . Blood transfusion July 2012  . Depression   . Anxiety   . Bipolar 1 disorder   . Menorrhagia   . Liver failure   . Bronchitis   . Cirrhosis of liver   . Wegener's granulomatosis   . History of sarcoidosis     Past Surgical History  Procedure Laterality Date  . Tubal ligation    . Laparoscopy    .  Orthopedic surgery    . Cholecystectomy    . Fracture surgery    . Hernia repair    . Vaginal hysterectomy  07/27/2011    Procedure: HYSTERECTOMY VAGINAL;  Surgeon: [name], MD;  Location: [facility];  Service: Gynecology;  Laterality: N/A;  Vaginal Hysterectomy with Right Oophorectomy  . Laparotomy  07/28/2011    Procedure: EXPLORATORY LAPAROTOMY;  Surgeon: [name], MD;  Location: [facility];  Service: Gynecology;  Laterality: N/A;   Family History:  Family History  Problem Relation Age of Onset  . Diabetes Mother   . Hypertension Mother   . Diabetes Daughter   . Hypertension Daughter    Social History:  History  Alcohol Use  . Yes    Comment: a fifth of vodka and a 12 pk a day of beer      History  Drug Use  . Yes  . Special: Cocaine, Marijuana    Comment: opioids, benzos    History   Social History  . Marital Status: Divorced    Spouse Name: N/A  . Number of Children: N/A  . Years of Education: N/A   Social History Main Topics  . Smoking status: Current Some Day Smoker -- 0.25 packs/day  . Smokeless tobacco: Never Used  . Alcohol Use: Yes     Comment: a fifth of vodka and a 12 pk a day of beer   . Drug  Use: Yes    Special: Cocaine, Marijuana     Comment: opioids, benzos  . Sexual Activity: Yes    Birth Control/ Protection: None   Other Topics Concern  . None   Social History Narrative   Additional History:    Sleep: Poor  Appetite:  Poor   Assessment:   Musculoskeletal: Strength & Muscle Tone: within normal limits Gait & Station: normal Patient leans: N/A   Psychiatric Specialty Exam: Physical Exam  Review of Systems  Constitutional: Positive for malaise/fatigue.  HENT: Negative.   Eyes: Positive for double vision.  Respiratory: Negative.   Cardiovascular: Negative.   Gastrointestinal: Negative.   Genitourinary: Negative.   Musculoskeletal: Positive for myalgias and joint pain.  Skin: Negative.   Neurological: Positive for dizziness  and weakness.  Endo/Heme/Allergies: Negative.   Psychiatric/Behavioral: Positive for depression and substance abuse. The patient is nervous/anxious and has insomnia.     Blood pressure 100/55, pulse 65, temperature 97.6 F (36.4 C), temperature source Oral, resp. rate 16, height 5\' 7"  (1.702 m), weight 109.77 kg (242 lb).Body mass index is 37.89 kg/(m^2).  General Appearance: Fairly Groomed  Engineer, water::  Fair  Speech:  Slow  Volume:  Decreased  Mood:  Anxious, Depressed and Hopeless fearful of relapsing   Affect:  Restricted and tired  Thought Process:  Coherent and Goal Directed  Orientation:  Full (Time, Place, and Person)  Thought Content:  symptoms events worries concerns  Suicidal Thoughts:  As she gets more hopeless helpless tired has started thinking about suicide  Homicidal Thoughts:  No  Memory:  Immediate;   Fair Recent;   Fair Remote;   Fair  Judgement:  Fair  Insight:  Shallow  Psychomotor Activity:  Restlessness  Concentration:  Poor  Recall:  [employer] of Knowledge:Fair  Language: Fair  Akathisia:  No  Handed:  Right  AIMS (if indicated):     Assets:  Desire for Improvement Housing  ADL's:  Intact  Cognition: WNL  Sleep:  Number of Hours: 2.75     Current Medications: Current Facility-Administered Medications  Medication Dose Route Frequency Provider Last Rate Last Dose  . acetaminophen (TYLENOL) tablet 650 mg  650 mg Oral Q6H PRN Waylan Boga, NP      . albuterol (PROVENTIL HFA;VENTOLIN HFA) 108 (90 BASE) MCG/ACT inhaler 1-2 puff  1-2 puff Inhalation Q6H PRN Waylan Boga, NP      . alum & mag hydroxide-simeth (MAALOX/MYLANTA) 200-200-20 MG/5ML suspension 30 mL  30 mL Oral Q4H PRN Waylan Boga, NP      . benzonatate (TESSALON) capsule 100 mg  100 mg Oral TID PRN Samantha Crimes, NP   100 mg at 02/12/15 0119  . carisoprodol (SOMA) tablet 350 mg  350 mg Oral TID Nicholaus Bloom, MD   350 mg at 02/12/15 1150  . clonazePAM (KLONOPIN) tablet 1 mg  1 mg  Oral QHS Nicholaus Bloom, MD      . DULoxetine (CYMBALTA) DR capsule 60 mg  60 mg Oral BID Waylan Boga, NP   60 mg at 02/12/15 0814  . loratadine (CLARITIN) tablet 10 mg  10 mg Oral Daily Samantha Crimes, NP   10 mg at 02/12/15 0119  . LORazepam (ATIVAN) tablet 1 mg  1 mg Oral Q6H PRN Nicholaus Bloom, MD   1 mg at 02/10/15 2322  . [START ON 02/13/2015] LORazepam (ATIVAN) tablet 1 mg  1 mg Oral Daily Nicholaus Bloom, MD      .  magnesium hydroxide (MILK OF MAGNESIA) suspension 30 mL  30 mL Oral Daily PRN Waylan Boga, NP      . multivitamin with minerals tablet 1 tablet  1 tablet Oral Daily Encarnacion Slates, NP   1 tablet at 02/12/15 [phone]  . propranolol (INDERAL) tablet 20 mg  20 mg Oral BID Waylan Boga, NP   20 mg at 02/12/15 0814  . QUEtiapine (SEROQUEL) tablet 400 mg  400 mg Oral QHS Nicholaus Bloom, MD      . thiamine (B-1) injection 100 mg  100 mg Intramuscular Once Encarnacion Slates, NP   100 mg at 02/07/15 1330  . thiamine (VITAMIN B-1) tablet 100 mg  100 mg Oral Daily Encarnacion Slates, NP   100 mg at 02/12/15 0815  . thyroid (ARMOUR) tablet 30 mg  30 mg Oral QAC breakfast Waylan Boga, NP   30 mg at 02/12/15 0601  . traZODone (DESYREL) tablet 50 mg  50 mg Oral QHS,MR X 1 Laverle Hobby, PA-C   50 mg at 02/11/15 2318    Lab Results: No results found for this or any previous visit (from the past 48 hour(s)).  Physical Findings: AIMS: Facial and Oral Movements Muscles of Facial Expression: None, normal Lips and Perioral Area: None, normal Jaw: None, normal Tongue: None, normal,Extremity Movements Upper (arms, wrists, hands, fingers): None, normal Lower (legs, knees, ankles, toes): None, normal, Trunk Movements Neck, shoulders, hips: None, normal, Overall Severity Severity of abnormal movements (highest score from questions above): None, normal Incapacitation due to abnormal movements: None, normal Patient's awareness of abnormal movements (rate only patient's report): No Awareness, Dental  Status Current problems with teeth and/or dentures?: No Does patient usually wear dentures?: No  CIWA:  CIWA-Ar Total: 1 COWS:  COWS Total Score: 10  Treatment Plan Summary: Daily contact with patient to assess and evaluate symptoms and progress in treatment and Medication management  Alcohol dependence: complete the Ativan detox protocol Alcohol Cravings; will start Campral 333 mg two TID Work a relapse prevention plan Insomnia: will D/C the Zyprexa and resume the Seroquel at 400 mg HS Mood Instability: will use the Seroquel as a mood stabilizer Depression; will pursue the Cymbalta Needs further stabilization in the inpatient setting. Erin [name] states she does not feel safe outside of here, afraid she is going to act on thoughts of hurting herself as she gets more frustrated not being able to sleep or overall "feel better" Has no positive support out there as she just gravitated towards people using and excluded the people who were not    Medical Decision Making:  Review of Psycho-Social Stressors (1), Review of Medication Regimen & Side Effects (2) and Review of New Medication or Change in Dosage (2)     Nimai Burbach A 02/12/2015, 2:17 PM

## 2015-02-12 NOTE — Progress Notes (Signed)
D: Patient presents today with anxious affect and depressed mood. She's reporting that she's still sleeping poorly, along with decreased concentration as well, fair appetite and low energy level. Patient rated depression/feelings of hopelessness "8" and anxiety "7". She's attending groups, but voices that because she didn't rest well last night, she's falling asleep during the sessions. Patient in compliance with the current medication regimen.  A: Support and encouragement provided to patient. Scheduled medications given per MD orders. Maintain Q15 minute checks for safety.  R: Patient receptive. Denies SI/HI. Patient remains safe on the hall.

## 2015-02-12 NOTE — BHH Group Notes (Signed)
[facility] LCSW Group Therapy  02/12/2015 2:33 PM  Type of Therapy:  Group Therapy  Participation Level:  Active  Participation Quality:  Drowsy  Affect:  Appropriate and Lethargic  Cognitive:  Lacking  Insight:  Limited  Engagement in Therapy:  Limited  Modes of Intervention:  Confrontation, Discussion, Education, Exploration, Problem-solving, Rapport Building, Socialization and Support  Summary of Progress/Problems:  Finding Balance in Life. Today's group focused on defining balance in one's own words, identifying things that can knock one off balance, and exploring healthy ways to maintain balance in life. Group members were asked to provide an example of a time when they felt off balance, describe how they handled that situation,and process healthier ways to regain balance in the future. Group members were asked to share the most important tool for maintaining balance that they learned while at [facility] and how they plan to apply this method after discharge. [name] struggled to remain awake during today's processing group. "I haven't slept last night and now I'm really tired." [name] shared taht she is starting to feel more balanced and feels "motivated and just ready to get my health back and start taking better care of myself. I have a good man who cares about me and that makes me feel good." [name] talked about the importance of having positive supports in one's life and how this affects her self worth. She dozed off during the last portion of today's group and apologized afterward-"sorry, I'm just so tired."   Smart, [place] LCSWA  02/12/2015, 2:33 PM

## 2015-02-13 MED ORDER — CLONAZEPAM 0.5 MG PO TABS: 0.5000 mg | ORAL_TABLET | Freq: Two times a day (BID) | ORAL | Status: DC | PRN

## 2015-02-13 MED ORDER — TRAZODONE HCL 100 MG PO TABS
100.0000 mg | ORAL_TABLET | Freq: Every evening | ORAL | Status: DC | PRN
  Administered 2015-02-13 – 2015-02-17 (×8): 100 mg via ORAL
  Filled 2015-02-13 (×2): qty 1
  Filled 2015-02-13: qty 8
  Filled 2015-02-13 (×4): qty 1
  Filled 2015-02-13: qty 8
  Filled 2015-02-13 (×5): qty 1

## 2015-02-13 MED ORDER — QUETIAPINE FUMARATE 50 MG PO TABS
50.0000 mg | ORAL_TABLET | Freq: Three times a day (TID) | ORAL | Status: DC
  Administered 2015-02-13 – 2015-02-17 (×12): 50 mg via ORAL
  Filled 2015-02-13 (×6): qty 1
  Filled 2015-02-13: qty 12
  Filled 2015-02-13 (×3): qty 1
  Filled 2015-02-13: qty 12
  Filled 2015-02-13 (×6): qty 1
  Filled 2015-02-13: qty 12
  Filled 2015-02-13: qty 1

## 2015-02-13 MED ORDER — CLONAZEPAM 0.5 MG PO TABS
0.5000 mg | ORAL_TABLET | Freq: Two times a day (BID) | ORAL | Status: DC
  Administered 2015-02-13 – 2015-02-17 (×8): 0.5 mg via ORAL
  Filled 2015-02-13 (×9): qty 1

## 2015-02-13 NOTE — BHH Group Notes (Signed)
[facility] LCSW Aftercare Discharge Planning Group Note   02/13/2015 10:02 AM  Participation Quality:  Appropriate   Mood/Affect:  Appropriate  Depression Rating:  8  Anxiety Rating:  0  Thoughts of Suicide:  No Will you contract for safety?   NA  Current AVH:  no  Plan for Discharge/Comments:  Pt presents as pleasant and calm, however she rates depression as high and high withdrawals. Pt wants to stay through weekend and is asking for several referrals. CSW explained to pt that she would need referral through PCP for most of her requests; however pt continues to request appts for dentist, lap band surgery, etc. Pt pleasant and cooperative/easily redirectable. Pt plans to return home and follow up with [facility] and her PCP-pt refusing to give consent for CSW/MD to contact PCP. "I don't want him to find out I was here and drop me from his care."   Transportation Means: bus   Supports: boyfriend.   Smart, [employer]

## 2015-02-13 NOTE — Progress Notes (Signed)
[facility] MD Progress Note  02/13/2015 4:54 PM ANABELLA CAPSHAW  MRN:  [id number] Subjective:  Still having problems with sleep, but slept more last night back on the Seroquel. She endorses persistent anxiety, racing thoughts. She would like to be back on the Klonopin she was prescribed during the day as states it did help the anxiety, the worry. She states she does not want to mess up again. States that she was doing OK but the holidays, her birthday got her off track. Once off track she has a hard time getting back. States she starts drinking and she continues to drink to get drunk. Then states her mood gets all over the place and then she starts feeling guilty and starts thinking about ending it all Principal Problem: <principal problem not specified> Diagnosis:   Patient Active Problem List   Diagnosis Date Noted  . Alcohol dependence with alcohol-induced mood disorder [F10.24]     Priority: High  . Bipolar affective disorder, depressed, severe [F31.4] 02/06/2015    Priority: High  . Opioid dependence [F11.20] 05/06/2014    Priority: High  . Cocaine dependence [F14.20] 07/17/2013    Priority: High  . HYPOTHYROIDISM [E03.9] 03/02/2009    Priority: High  . Bipolar disorder [F31.9] 02/09/2015  . Suicidal ideations [R45.851]   . Thrombocytopenia [D69.6] 01/06/2013  . Sarcoidosis [D86.9] 03/30/2010  . HYPERTENSION, BENIGN ESSENTIAL [I10] 01/11/2010  . ALCOHOLIC LIVER DISEASE [K24.0] 03/06/2008  . CIRRHOSIS, ALCOHOLIC, LIVER [X73.53] 29/92/4268  . TOBACCO DEPENDENCE [Z72.0] 02/22/2007  . EXTERNAL HEMORRHOIDS [K64.8] 01/11/2005  . PORTAL HYPERTENSION [K76.6] 01/11/2005   Total Time spent with patient: 30 minutes   Past Medical History:  Past Medical History  Diagnosis Date  . Hypothyroidism   . Blood transfusion July 2012  . Depression   . Anxiety   . Bipolar 1 disorder   . Menorrhagia   . Liver failure   . Bronchitis   . Cirrhosis of liver   . Wegener's granulomatosis   .  History of sarcoidosis     Past Surgical History  Procedure Laterality Date  . Tubal ligation    . Laparoscopy    . Orthopedic surgery    . Cholecystectomy    . Fracture surgery    . Hernia repair    . Vaginal hysterectomy  07/27/2011    Procedure: HYSTERECTOMY VAGINAL;  Surgeon: [name], MD;  Location: [facility];  Service: Gynecology;  Laterality: N/A;  Vaginal Hysterectomy with Right Oophorectomy  . Laparotomy  07/28/2011    Procedure: EXPLORATORY LAPAROTOMY;  Surgeon: [name], MD;  Location: [facility];  Service: Gynecology;  Laterality: N/A;   Family History:  Family History  Problem Relation Age of Onset  . Diabetes Mother   . Hypertension Mother   . Diabetes Daughter   . Hypertension Daughter    Social History:  History  Alcohol Use  . Yes    Comment: a fifth of vodka and a 12 pk a day of beer      History  Drug Use  . Yes  . Special: Cocaine, Marijuana    Comment: opioids, benzos    History   Social History  . Marital Status: Divorced    Spouse Name: N/A  . Number of Children: N/A  . Years of Education: N/A   Social History Main Topics  . Smoking status: Current Some Day Smoker -- 0.25 packs/day  . Smokeless tobacco: Never Used  . Alcohol Use: Yes     Comment: a fifth  of vodka and a 12 pk a day of beer   . Drug Use: Yes    Special: Cocaine, Marijuana     Comment: opioids, benzos  . Sexual Activity: Yes    Birth Control/ Protection: None   Other Topics Concern  . None   Social History Narrative   Additional History:    Sleep: better last night but still not enough  Appetite:  Fair   Assessment:   Musculoskeletal: Strength & Muscle Tone: within normal limits Gait & Station: normal Patient leans: N/A   Psychiatric Specialty Exam: Physical Exam  Review of Systems  Constitutional: Positive for malaise/fatigue.  HENT: Negative.   Eyes: Negative.   Respiratory: Negative.   Cardiovascular: Negative.   Gastrointestinal: Negative.    Genitourinary: Negative.   Musculoskeletal: Positive for back pain and joint pain.  Skin: Negative.   Neurological: Positive for weakness.  Endo/Heme/Allergies: Negative.   Psychiatric/Behavioral: Positive for depression and substance abuse.    Blood pressure 93/67, pulse 60, temperature 98.5 F (36.9 C), temperature source Oral, resp. rate 17, height 5\' 7"  (1.702 m), weight 109.77 kg (242 lb).Body mass index is 37.89 kg/(m^2).  General Appearance: Fairly Groomed  Engineer, water::  Fair  Speech:  Clear and Coherent and Slow  Volume:  Decreased  Mood:  Anxious and worried  Affect:  anxious worried  Thought Process:  Coherent and Goal Directed  Orientation:  Full (Time, Place, and Person)  Thought Content:  symptoms events worries concerns  Suicidal Thoughts:  intermittent, still not feeling safe " not turned the corner yet"  Homicidal Thoughts:  No  Memory:  Immediate;   Fair Recent;   Fair Remote;   Fair  Judgement:  Fair  Insight:  Present  Psychomotor Activity:  Restlessness  Concentration:  Fair  Recall:  [employer] of Knowledge:Fair  Language: Fair  Akathisia:  No  Handed:  Right  AIMS (if indicated):     Assets:  Desire for Improvement Housing Social Support  ADL's:  Intact  Cognition: WNL  Sleep:  Number of Hours: 4.25     Current Medications: Current Facility-Administered Medications  Medication Dose Route Frequency Provider Last Rate Last Dose  . acetaminophen (TYLENOL) tablet 650 mg  650 mg Oral Q6H PRN Waylan Boga, NP      . albuterol (PROVENTIL HFA;VENTOLIN HFA) 108 (90 BASE) MCG/ACT inhaler 1-2 puff  1-2 puff Inhalation Q6H PRN Waylan Boga, NP      . alum & mag hydroxide-simeth (MAALOX/MYLANTA) 200-200-20 MG/5ML suspension 30 mL  30 mL Oral Q4H PRN Waylan Boga, NP      . benzonatate (TESSALON) capsule 100 mg  100 mg Oral TID PRN Samantha Crimes, NP   100 mg at 02/13/15 0350  . carisoprodol (SOMA) tablet 350 mg  350 mg Oral TID Nicholaus Bloom, MD    350 mg at 02/13/15 1633  . clonazePAM (KLONOPIN) tablet 0.5 mg  0.5 mg Oral BID Nicholaus Bloom, MD   0.5 mg at 02/13/15 1633  . clonazePAM (KLONOPIN) tablet 1 mg  1 mg Oral QHS Nicholaus Bloom, MD   1 mg at 02/12/15 2009  . DULoxetine (CYMBALTA) DR capsule 60 mg  60 mg Oral BID Waylan Boga, NP   60 mg at 02/13/15 1633  . loratadine (CLARITIN) tablet 10 mg  10 mg Oral Daily Samantha Crimes, NP   10 mg at 02/13/15 0824  . magnesium hydroxide (MILK OF MAGNESIA) suspension 30 mL  30 mL Oral  Daily PRN Waylan Boga, NP      . multivitamin with minerals tablet 1 tablet  1 tablet Oral Daily Encarnacion Slates, NP   1 tablet at 02/13/15 [phone]  . propranolol (INDERAL) tablet 20 mg  20 mg Oral BID Waylan Boga, NP   20 mg at 02/13/15 1633  . QUEtiapine (SEROQUEL) tablet 400 mg  400 mg Oral QHS Nicholaus Bloom, MD   400 mg at 02/12/15 2010  . QUEtiapine (SEROQUEL) tablet 50 mg  50 mg Oral TID Nicholaus Bloom, MD   50 mg at 02/13/15 1633  . thiamine (B-1) injection 100 mg  100 mg Intramuscular Once Encarnacion Slates, NP   100 mg at 02/07/15 1330  . thiamine (VITAMIN B-1) tablet 100 mg  100 mg Oral Daily Encarnacion Slates, NP   100 mg at 02/13/15 8657  . thyroid (ARMOUR) tablet 30 mg  30 mg Oral QAC breakfast Waylan Boga, NP   30 mg at 02/13/15 8469  . traZODone (DESYREL) tablet 100 mg  100 mg Oral QHS,MR X 1 Nicholaus Bloom, MD        Lab Results: No results found for this or any previous visit (from the past 48 hour(s)).  Physical Findings: AIMS: Facial and Oral Movements Muscles of Facial Expression: None, normal Lips and Perioral Area: None, normal Jaw: None, normal Tongue: None, normal,Extremity Movements Upper (arms, wrists, hands, fingers): None, normal Lower (legs, knees, ankles, toes): None, normal, Trunk Movements Neck, shoulders, hips: None, normal, Overall Severity Severity of abnormal movements (highest score from questions above): None, normal Incapacitation due to abnormal movements: None,  normal Patient's awareness of abnormal movements (rate only patient's report): No Awareness, Dental Status Current problems with teeth and/or dentures?: No Does patient usually wear dentures?: No  CIWA:  CIWA-Ar Total: 1 COWS:  COWS Total Score: 10  Treatment Plan Summary: Daily contact with patient to assess and evaluate symptoms and progress in treatment and Medication management Supportive approach/coping skills/relapse prevention Mood instability: will continue the Seroquel at 400 mg HS but will add 50 mg TID to help with the anxiety during the day Anxiety; will resume the klonopin 0.5 mg BID she was being prescribed Insomnia: will continue to work with the Seroquel 400 mg at HS, the klonopin 1 mg HS and will increase the Trazodone to 100 mg HS PRN sleep Continue to work a relapse prevention plan  Medical Decision Making:  Review of Psycho-Social Stressors (1), Review of Medication Regimen & Side Effects (2) and Review of New Medication or Change in Dosage (2)     Iverna Hammac A 02/13/2015, 4:54 PM

## 2015-02-13 NOTE — BHH Group Notes (Signed)
[place] LCSW Group Therapy  02/13/2015 4:13 PM  Type of Therapy:  Group Therapy  Participation Level:  Minimal  Participation Quality:  Drowsy  Affect:  Lethargic  Cognitive:  Lacking  Insight:  Limited  Engagement in Therapy:  Limited  Modes of Intervention:  Confrontation, Discussion, Education, Exploration, Problem-solving, Rapport Building, Socialization and Support  Summary of Progress/Problems: Feelings around Relapse. Group members discussed the meaning of relapse and shared personal stories of relapse, how it affected them and others, and how they perceived themselves during this time. Group members were encouraged to identify triggers, warning signs and coping skills used when facing the possibility of relapse. Social supports were discussed and explored in detail. Post Acute Withdrawal Syndrome (handout provided) was introduced and examined. Pt's were encouraged to ask questions, talk about key points associated with PAWS, and process this information in terms of relapse prevention. Erin [name] attended group but had difficulty remaining awake. She presents with drowsy affect and did not actively participate in group discussion. At this time, Erin [name] is not making progress in the group setting. Pt stated that she did not get much sleep last night and has been feeling tired all day.    Smart, Srihith Aquilino LCSW  02/13/2015, 4:13 PM

## 2015-02-13 NOTE — Progress Notes (Signed)
Patient ID: Erin [name], female   DOB: 21-Sep-1966, 49 y.o.   MRN: [id number]   D: Pt had a visitor with her during the initial introduction. The visitor asked the writer "what days had conjugal visits". Writer informed pt's "boyfriend" that we don't have conjugal visits. After her visitor left pt informed the writer that her day was "ok".  Stated she had difficulty sleeping the night before and was instructed to take her meds at 2000.   A: Gave pt scheduled meds and encouraged to stay awake until bed time. Support and encouragement was offered. 15 min checks  Continued for safety.   R: Pt remains safe.

## 2015-02-13 NOTE — Tx Team (Signed)
Interdisciplinary Treatment Plan Update (Adult)   Date: 02/13/2015   Time Reviewed: 11:30 AM  Progress in Treatment:  Attending groups: Yes  Participating in groups:  Yes  Taking medication as prescribed: Yes  Tolerating medication: Yes  Family/Significant othe contact made: Not yet. SPE required for this pt. Pt also requesting family session/CSW assessing.   Patient understands diagnosis: Yes, AEB seeking treatment for ETOH detox, crack cocaine abuse, passive SI/Depression/mood instability, and for medication stabilization.  Discussing patient identified problems/goals with staff: Yes  Medical problems stabilized or resolved: Yes  Denies suicidal/homicidal ideation: Yes during group/self report.  Patient has not harmed self or Others: Yes  New problem(s) identified:  Discharge Plan or Barriers: Pt would like to detox, stabilize, and return home. Pt and CSW met to discuss aftercare-decided on ADS for med management and assessment for SAIOP/individual therapy. Pt given list of AA meetings and encouraged to begin attending. Pt also asking for [employer] info "I can get in free with medicaid and medicare and need something to do like that." CSW assessing. Pt reporting poor sleep currently and VH due to lack of sleep. Minimal withdrawals reported.  Additional comments: Erin [name] is a 49 year old Caucasian female. Admitted to [facility] from the [facility]. She reports, "The police took me to the hospital ED because I wanted to sign myself in to the hospital. I have been binging on alcohol & cocaine everyday since Christmas. I need to stop. I need help getting into some rehab treatment. I was sober x 12 months prior to relapsing at Christmas. The holidays did me in. I did not have any family to celebrate Christmas with, so I used drugs and drank alcohol to help forget my problems. Right now, my body is doing a lot of shaking & twitching. I feel very anxious. Right now, my depression is at #9 and anxiety at  #8".  2/19: [name] has been attending and participating in most groups. She reports poor sleep, slight improvement last night. Pt repetative and requesting several referrals for specialist doctors-redirectable. Pt rating anxiety/depression/withdrawals as high but presents as pleasant and calm. Pt requesting to stay through weekend and have family session with bf at discharge. Continued med changes to assist her with sleep.  Reason for Continuation of Hospitalization: Mood stabilization/depression Medication management Estimated length of stay: 2-4 days  For review of initial/current patient goals, please see plan of care.  Attendees:  Patient:    Family:    Physician: Carlton Adam MD 02/13/2015 11:30 AM   Nursing: Maurice March RN 02/13/2015 11:30 AM   Clinical Social Worker Cheney, [place]  02/13/2015  11:30 AM   Other: Wallace Cullens LCSWA 02/13/2015 11:30 AM   Other: Mateo Flow; [facility] 02/13/2015 11:30 AM   Other: Gerline Legacy Nurse CM 02/13/2015 11:30 AM   Other:    Scribe for Treatment Team:  Nira Conn Smart LCSWA 02/13/2015 11:30 AM

## 2015-02-13 NOTE — Progress Notes (Signed)
Patient ID: Erin [name], female   DOB: [birth year], 49 y.o.   MRN: [id number]   D: Pt was asleep prior to the assessment. Upon entering the pt woke up and spoke to the Probation officer. When asked about her discharge plans pt stated she plans to return to her house. However, pt stated she "doesn't need a 2 bedroom house because she lives by herself. Pt wishes that she had someone to come over and spend time with her. Stated, "I don't have any friends".   Pt decided to attend AA group. However, the writer was informed that the pt stayed only a few minutes. Pt returned to her room and went to sleep.   A:  Support and encouragement was offered. 15 min checks continued for safety.  R: Pt remains safe.

## 2015-02-14 MED ORDER — POLYETHYLENE GLYCOL 3350 17 G PO PACK
17.0000 g | PACK | Freq: Every day | ORAL | Status: DC
  Administered 2015-02-15 – 2015-02-17 (×3): 17 g via ORAL
  Filled 2015-02-14 (×5): qty 1

## 2015-02-14 NOTE — [facility] Group Notes (Signed)
[facility] Group Notes:  (Clinical Social Work)  02/14/2015   1:15-2:15PM  Summary of Progress/Problems:   The main focus of today's process group was for the patient to identify ways in which they have sabotaged their own mental health wellness/recovery.  Motivational interviewing and a handout were used to explore the benefits and costs of their self-sabotaging behavior as well as the benefits and costs of changing this behavior.  The Stages of Change were explained to the group using a handout, and patients identified where they are with regard to changing self-defeating behaviors.  The patient expressed she self-sabotages with expecting others to read her mind and becoming very angry when they cannot do so.  She was somewhat monopolizing, but her comments did in fact have relevancy to the group topic.  Type of Therapy:  Process Group  Participation Level:  Active  Participation Quality:  Attentive, Monopolizing and Redirectable  Affect:  Blunted  Cognitive:  Alert and Appropriate  Insight:  Developing/Improving  Engagement in Therapy:  Engaged  Modes of Intervention:  Education, Motivational Interviewing   Selmer Dominion, LCSW 02/14/2015, 4:00pm

## 2015-02-14 NOTE — [facility]HH Group Notes (Signed)
The focus of this group is to educate the patient on the purpose and policies of crisis stabilization and provide a format to answer questions about their admission.  The group details unit policies and expectations of patients while admitted.  Patient attended 0900 nurse education orientation group this morning.  Patient actively participated, appropriate affect, alert, appropriate insight and engagement.  Today patient will work on 3 goals for discharge.  

## 2015-02-14 NOTE — Progress Notes (Signed)
Pt alert and cooperative. Affect/mood anxious, depressed and worried  C/o cough, generalized weakness and low blood pressure, see MAR. Pt encouraged to drink more fluids. -SI/HI, -A/[place], verbally contracts for safety. Emotional support and encouragement given. Will continue to monitor closely and evaluate for stabilization.

## 2015-02-14 NOTE — Progress Notes (Signed)
Pt attended part of [place] meeting. Pt reported to day room late for meeting. After participating in meeting for several minutes, pt returned to her room due to "I started coughing really bad."

## 2015-02-14 NOTE — Progress Notes (Signed)
D:  Patient's self inventory sheet, patient had fair sleep last night, sleep medication was helpful.  Fair appetite, low energy level, good concentration.  Rated depression and hopeless 6, anxiety 5.  Stated Erin [name] has withdrawals, sedation, cravings, nausea.  Denied SI.  Physical problems of pain, headaches, blurred vision.  Worst pain #7, leg/arm.  Pain medication is helpful.  Goal is to "getting myself together."  Plans are to talk which Erin [name] will discuss with staff.  Does have discharge plans.  No problems anticipated after discharge. A:  Medications administered per MD orders.  Emotional support and encouragement given patient. R:  Denied SI and HI, contracts for safety.  Denied A/V hallucinations.  Safety maintained with 15 minute checks.

## 2015-02-14 NOTE — Progress Notes (Signed)
Patient did not attend the evening speaker [place] meeting. Pt remained out of group due to a persistent cough.  Pt requested inhaler and it was offered by nurse.

## 2015-02-14 NOTE — Plan of Care (Signed)
Problem: Consults Goal: Depression Patient Education See Patient Education Module for education specifics.  Outcome: Completed/Met Date Met:  02/14/15 Nurse discussed depression with patient.     

## 2015-02-14 NOTE — Progress Notes (Signed)
Patient ID: Erin [name], female   DOB: 1966-02-06, 49 y.o.   MRN: [id number] [facility] MD Progress Note  02/14/2015 4:26 PM Erin [name]  MRN:  [id number] Subjective:  Met with Erin [name] who states she is a little better today. She is reporting feeling better today and has put together a list of things that she would like addressed.  The list includes using gas to help her relax during dental procedures as she needs many teeth fixed and she has seizures when people are working on her mouth.  She also wants to know whom to discuss having a Lap-Band with, as she now has [employer] and they will pay for it.  She was told that it would help with her Sarcoidosis if she lost some weight.  She also tells me that she only needs to be sober for 6 months before she can apply for the "donor list at [facility]" to get a liver transplant. She rates her depression as a 7/10 and reports that she got up twice during the night.  Objective: patient is up right and active on the hall. She is informative to say the least and reports feeling better. She does report some blurry vision but is able to attend her groups and says that too much Seroquel makes her shakey. She would like to try Sinequan for sleep. She appears sincere in her presentation of symptoms and does not appear to be med seeking.    Principal Problem: <principal problem not specified> Diagnosis:   Patient Active Problem List   Diagnosis Date Noted  . Bipolar disorder [F31.9] 02/09/2015  . Alcohol dependence with alcohol-induced mood disorder [F10.24]   . Bipolar affective disorder, depressed, severe [F31.4] 02/06/2015  . Suicidal ideations [R45.851]   . Opioid dependence [F11.20] 05/06/2014  . Cocaine dependence [F14.20] 07/17/2013  . Thrombocytopenia [D69.6] 01/06/2013  . Sarcoidosis [D86.9] 03/30/2010  . HYPERTENSION, BENIGN ESSENTIAL [I10] 01/11/2010  . HYPOTHYROIDISM [E03.9] 03/02/2009  . ALCOHOLIC LIVER DISEASE [X77.4] 03/06/2008  . CIRRHOSIS,  ALCOHOLIC, LIVER [F42.39] 53/20/2334  . TOBACCO DEPENDENCE [Z72.0] 02/22/2007  . EXTERNAL HEMORRHOIDS [K64.8] 01/11/2005  . PORTAL HYPERTENSION [K76.6] 01/11/2005   Total Time spent with patient: 20 minutes   Past Medical History:  Past Medical History  Diagnosis Date  . Hypothyroidism   . Blood transfusion July 2012  . Depression   . Anxiety   . Bipolar 1 disorder   . Menorrhagia   . Liver failure   . Bronchitis   . Cirrhosis of liver   . Wegener's granulomatosis   . History of sarcoidosis     Past Surgical History  Procedure Laterality Date  . Tubal ligation    . Laparoscopy    . Orthopedic surgery    . Cholecystectomy    . Fracture surgery    . Hernia repair    . Vaginal hysterectomy  07/27/2011    Procedure: HYSTERECTOMY VAGINAL;  Surgeon: [name], MD;  Location: [facility];  Service: Gynecology;  Laterality: N/A;  Vaginal Hysterectomy with Right Oophorectomy  . Laparotomy  07/28/2011    Procedure: EXPLORATORY LAPAROTOMY;  Surgeon: [name], MD;  Location: [facility];  Service: Gynecology;  Laterality: N/A;   Family History:  Family History  Problem Relation Age of Onset  . Diabetes Mother   . Hypertension Mother   . Diabetes Daughter   . Hypertension Daughter    Social History:  History  Alcohol Use  . Yes    Comment: a fifth of vodka and  a 12 pk a day of beer      History  Drug Use  . Yes  . Special: Cocaine, Marijuana    Comment: opioids, benzos    History   Social History  . Marital Status: Divorced    Spouse Name: N/A  . Number of Children: N/A  . Years of Education: N/A   Social History Main Topics  . Smoking status: Current Some Day Smoker -- 0.25 packs/day  . Smokeless tobacco: Never Used  . Alcohol Use: Yes     Comment: a fifth of vodka and a 12 pk a day of beer   . Drug Use: Yes    Special: Cocaine, Marijuana     Comment: opioids, benzos  . Sexual Activity: Yes    Birth Control/ Protection: None   Other Topics Concern  . None    Social History Narrative   Additional History:    Sleep: better last night but still not enough  Appetite:  Fair   Assessment:   Musculoskeletal: Strength & Muscle Tone: within normal limits Gait & Station: normal Patient leans: N/A   Psychiatric Specialty Exam: Physical Exam  ROS  Blood pressure 93/61, pulse 68, temperature 98.5 F (36.9 C), temperature source Oral, resp. rate 16, height $RemoveBe'5\' 7"'stYuzIgQB$  (1.702 m), weight 109.77 kg (242 lb).Body mass index is 37.89 kg/(m^2).  General Appearance: Fairly Groomed  Engineer, water::  Fair  Speech:  Clear and Coherent and Slow  Volume:  Decreased  Mood:  Anxious and worried  Affect:  anxious worried  Thought Process:  Coherent and Goal Directed  Orientation:  Full (Time, Place, and Person)  Thought Content:  symptoms events worries concerns  Suicidal Thoughts:  Denies SI  Homicidal Thoughts:  No  Memory:  Immediate;   Fair Recent;   Fair Remote;   Fair  Judgement:  Fair  Insight:  Present  Psychomotor Activity:  Restlessness  Concentration:  Fair  Recall:  [employer] of Knowledge:Fair  Language: Fair  Akathisia:  No  Handed:  Right  AIMS (if indicated):     Assets:  Desire for Improvement Housing Social Support  ADL's:  Intact  Cognition: WNL  Sleep:  Number of Hours: 4.5     Current Medications: Current Facility-Administered Medications  Medication Dose Route Frequency Provider Last Rate Last Dose  . acetaminophen (TYLENOL) tablet 650 mg  650 mg Oral Q6H PRN Waylan Boga, NP      . albuterol (PROVENTIL HFA;VENTOLIN HFA) 108 (90 BASE) MCG/ACT inhaler 1-2 puff  1-2 puff Inhalation Q6H PRN Waylan Boga, NP      . alum & mag hydroxide-simeth (MAALOX/MYLANTA) 200-200-20 MG/5ML suspension 30 mL  30 mL Oral Q4H PRN Waylan Boga, NP   30 mL at 02/14/15 0208  . benzonatate (TESSALON) capsule 100 mg  100 mg Oral TID PRN Samantha Crimes, NP   100 mg at 02/13/15 0350  . carisoprodol (SOMA) tablet 350 mg  350 mg Oral TID  Nicholaus Bloom, MD   350 mg at 02/14/15 1157  . clonazePAM (KLONOPIN) tablet 0.5 mg  0.5 mg Oral BID Nicholaus Bloom, MD   0.5 mg at 02/14/15 0800  . clonazePAM (KLONOPIN) tablet 1 mg  1 mg Oral QHS Nicholaus Bloom, MD   1 mg at 02/13/15 2210  . DULoxetine (CYMBALTA) DR capsule 60 mg  60 mg Oral BID Waylan Boga, NP   60 mg at 02/14/15 0800  . loratadine (CLARITIN) tablet 10 mg  10 mg  Oral Daily Samantha Crimes, NP   10 mg at 02/14/15 0800  . magnesium hydroxide (MILK OF MAGNESIA) suspension 30 mL  30 mL Oral Daily PRN Waylan Boga, NP      . multivitamin with minerals tablet 1 tablet  1 tablet Oral Daily Encarnacion Slates, NP   1 tablet at 02/14/15 0800  . [START ON 02/15/2015] polyethylene glycol (MIRALAX / GLYCOLAX) packet 17 g  17 g Oral Daily Clinton Sawyer, NP      . propranolol (INDERAL) tablet 20 mg  20 mg Oral BID Waylan Boga, NP   20 mg at 02/14/15 0800  . QUEtiapine (SEROQUEL) tablet 400 mg  400 mg Oral QHS Nicholaus Bloom, MD   400 mg at 02/13/15 2210  . QUEtiapine (SEROQUEL) tablet 50 mg  50 mg Oral TID Nicholaus Bloom, MD   50 mg at 02/14/15 1158  . thiamine (B-1) injection 100 mg  100 mg Intramuscular Once Encarnacion Slates, NP   100 mg at 02/07/15 1330  . thiamine (VITAMIN B-1) tablet 100 mg  100 mg Oral Daily Encarnacion Slates, NP   100 mg at 02/14/15 0759  . thyroid (ARMOUR) tablet 30 mg  30 mg Oral QAC breakfast Waylan Boga, NP   30 mg at 02/14/15 0617  . traZODone (DESYREL) tablet 100 mg  100 mg Oral QHS,MR X 1 Nicholaus Bloom, MD   100 mg at 02/14/15 0124    Lab Results: No results found for this or any previous visit (from the past 40 hour(s)).  Physical Findings: AIMS: Facial and Oral Movements Muscles of Facial Expression: None, normal Lips and Perioral Area: None, normal Jaw: None, normal Tongue: None, normal,Extremity Movements Upper (arms, wrists, hands, fingers): None, normal Lower (legs, knees, ankles, toes): None, normal, Trunk Movements Neck, shoulders, hips: None, normal,  Overall Severity Severity of abnormal movements (highest score from questions above): None, normal Incapacitation due to abnormal movements: None, normal Patient's awareness of abnormal movements (rate only patient's report): No Awareness, Dental Status Current problems with teeth and/or dentures?: No Does patient usually wear dentures?: No  CIWA:  CIWA-Ar Total: 1 COWS:  COWS Total Score: 1  Treatment Plan Summary: Daily contact with patient to assess and evaluate symptoms and progress in treatment and Medication management Supportive approach/coping skills/relapse prevention Continue to work a relapse prevention plan  Medical Decision Making:  Review of Psycho-Social Stressors (1), Review of Medication Regimen & Side Effects (2) and Review of New Medication or Change in Dosage (2)   Milta Deiters T. Rusti Arizmendi RPAC 4:38 PM 02/14/2015

## 2015-02-15 NOTE — Progress Notes (Signed)
Patient ID: Erin [name], female   DOB: 07/14/1966, 49 y.o.   MRN: [id number] Adult Psychoeducational Group Note  Date:  02/15/2015 Time:  09:40 AM  Group Topic/Focus:  Making Healthy Choices:   The focus of this group is to help patients identify negative/unhealthy choices they were using prior to admission and identify positive/healthier coping strategies to replace them upon discharge.  Participation Level:  Active  Participation Quality:  Appropriate  Affect:  Appropriate  Cognitive:  Alert and Appropriate  Insight: Appropriate  Engagement in Group:  Engaged  Modes of Intervention:  Activity, Discussion, Education and Support  Additional Comments:  Pt able to verbalize healthy coping skills to utilize in their life.   Elenore Rota 02/15/2015, 10:34 AM

## 2015-02-15 NOTE — Progress Notes (Signed)
D:  Patient in the hallway on approach.  Patient states,"Im here" when asked how her day was.  Patient pleasant and cooperative.  Patient states she has been taking her medications.  Patient states she had a visit from a friend today and she states it went ok.  Patient denies SI/HI but states she hears music in her head.  Patient verbally contracts for safety. A: Staff to monitor Q 15 mins for safety.  Encouragement and support offered.  Scheduled medications administered per orders.  Tessalon given prn for a cough. R: Patient remains safe on the unit.  Patient did not attend group tonight.  Patient visible on the unit during snack time and after group.  Patient taking administered medications.

## 2015-02-15 NOTE — [facility] Group Notes (Signed)
[facility] Group Notes:  (Clinical Social Work)  02/15/2015   1:15-2:15PM  Summary of Progress/Problems:  The main focus of today's process group was to   identify the patient's current support system and decide on other supports that can be put in place.  The picture on workbook was used to discuss why additional supports are needed.  An emphasis was placed on using counselor, doctor, therapy groups, 12-step groups, and problem-specific support groups to expand supports.   There was also an extensive discussion about what constitutes a healthy support versus an unhealthy support.  The patient expressed full comprehension of the concepts presented.  She wants to add church as an additional positive, healthy support but also feels scared she will be judged because of her tattoos.  She was open to suggestions by others.  She displayed much humor throughout group, and was very supportive to others with humor.  Type of Therapy:  Process Group  Participation Level:  Active  Participation Quality:  Attentive and Sharing  Affect:  Blunted  Cognitive:  Appropriate and Oriented  Insight:  Engaged  Engagement in Therapy:  Engaged  Modes of Intervention:  Education,  Support and [employer], LCSW 02/15/2015, 4:00pm

## 2015-02-15 NOTE — Progress Notes (Signed)
Patient ID: Erin [name], female   DOB: Jun 30, 1966, 49 y.o.   MRN: [id number]  Pt currently presents with a flat affect and intrusive, anxious behavior. Per self inventory, pt rates depression at a 7, hopelessness 6 and anxiety 7. Pt's daily goal is to "stay, wait." Pt reports fair sleep, poor concentration and a fair appetite.   Pt provided with medications per providers orders. Pt's labs and vitals were monitored throughout the day. Pt supported emotionally and encouraged to express concerns and questions. Pt offered 1:1 support, pt reports "I don't want to stay up late all the time, I want to get up and make breakfast and do normal things." Pt encouraged to work on changing negative behaviors and to speak with social worker about other concerns.  Pt's safety ensured with 15 minute and environmental checks. Pt currently denies SI/HI and A/V hallucinations. Pt verbally agrees to seek staff if SI/HI or A/VH occurs and to consult with staff before acting on these thoughts.

## 2015-02-15 NOTE — Progress Notes (Addendum)
Patient ID: Erin [name], female   DOB: Nov 29, 1966, 49 y.o.   MRN: [id number]  Adult Psychoeducational Group Note  Date:  02/15/2015 Time:  09:15 AM  Group Topic/Focus:  Orientation:   The focus of this group is to educate the patient on the purpose and policies of crisis stabilization and provide a format to answer questions about their admission.  The group details unit policies and expectations of patients while admitted.  Participation Level:  Active  Participation Quality:  Appropriate  Affect:  Appropriate  Cognitive:  Alert  Insight: Appropriate  Engagement in Group:  Engaged  Modes of Intervention:  Education, Orientation and Support  Additional Comments:  Pt to establish one daily goal to work on today.   Elenore Rota 02/15/2015, 10:24 AM
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
## 2015-02-15 NOTE — Progress Notes (Signed)
Patient ID: YAZMINA PAREJA, female   DOB: 07-14-1966, 49 y.o.   MRN: [id number] Patient ID: DEBANHI BLAKER, female   DOB: September 26, 1966, 49 y.o.   MRN: [id number] [facility] MD Progress Note  02/15/2015 3:36 PM SAARA KIJOWSKI  MRN:  [id number] Subjective:  Met with patient who was naked in bed after lunch. She reports that she has been up since 5:30am and has attended all the groups. Objective: The patient appears sleepy and drowsy and doses off while I am speaking with her. She received Seroquel at noon.  She did deny SI/HI or AVH, but drifted off to sleep and became unintelligible after that.  Principal Problem: <principal problem not specified> Diagnosis:   Patient Active Problem List   Diagnosis Date Noted  . Bipolar disorder [F31.9] 02/09/2015  . Alcohol dependence with alcohol-induced mood disorder [F10.24]   . Bipolar affective disorder, depressed, severe [F31.4] 02/06/2015  . Suicidal ideations [R45.851]   . Opioid dependence [F11.20] 05/06/2014  . Cocaine dependence [F14.20] 07/17/2013  . Thrombocytopenia [D69.6] 01/06/2013  . Sarcoidosis [D86.9] 03/30/2010  . HYPERTENSION, BENIGN ESSENTIAL [I10] 01/11/2010  . HYPOTHYROIDISM [E03.9] 03/02/2009  . ALCOHOLIC LIVER DISEASE [Y86.5] 03/06/2008  . CIRRHOSIS, ALCOHOLIC, LIVER [H84.69] 62/95/2841  . TOBACCO DEPENDENCE [Z72.0] 02/22/2007  . EXTERNAL HEMORRHOIDS [K64.8] 01/11/2005  . PORTAL HYPERTENSION [K76.6] 01/11/2005   Total Time spent with patient: 20 minutes   Past Medical History:  Past Medical History  Diagnosis Date  . Hypothyroidism   . Blood transfusion July 2012  . Depression   . Anxiety   . Bipolar 1 disorder   . Menorrhagia   . Liver failure   . Bronchitis   . Cirrhosis of liver   . Wegener's granulomatosis   . History of sarcoidosis     Past Surgical History  Procedure Laterality Date  . Tubal ligation    . Laparoscopy    . Orthopedic surgery    . Cholecystectomy    . Fracture surgery    . Hernia repair     . Vaginal hysterectomy  07/27/2011    Procedure: HYSTERECTOMY VAGINAL;  Surgeon: [name], MD;  Location: [facility];  Service: Gynecology;  Laterality: N/A;  Vaginal Hysterectomy with Right Oophorectomy  . Laparotomy  07/28/2011    Procedure: EXPLORATORY LAPAROTOMY;  Surgeon: [name], MD;  Location: [facility];  Service: Gynecology;  Laterality: N/A;   Family History:  Family History  Problem Relation Age of Onset  . Diabetes Mother   . Hypertension Mother   . Diabetes Daughter   . Hypertension Daughter    Social History:  History  Alcohol Use  . Yes    Comment: a fifth of vodka and a 12 pk a day of beer      History  Drug Use  . Yes  . Special: Cocaine, Marijuana    Comment: opioids, benzos    History   Social History  . Marital Status: Divorced    Spouse Name: N/A  . Number of Children: N/A  . Years of Education: N/A   Social History Main Topics  . Smoking status: Current Some Day Smoker -- 0.25 packs/day  . Smokeless tobacco: Never Used  . Alcohol Use: Yes     Comment: a fifth of vodka and a 12 pk a day of beer   . Drug Use: Yes    Special: Cocaine, Marijuana     Comment: opioids, benzos  . Sexual Activity: Yes    Birth Control/ Protection: None  Other Topics Concern  . None   Social History Narrative   Additional History:    Sleep: better last night but still not enough  Appetite:  Fair   Assessment:  May need to decrease seroquel dose during the afternoon due to sedation.  Musculoskeletal: Strength & Muscle Tone: within normal limits Gait & Station: normal Patient leans: N/A   Psychiatric Specialty Exam: Physical Exam  ROS  Blood pressure 95/62, pulse 70, temperature 97.8 F (36.6 C), temperature source Oral, resp. rate 17, height '5\' 7"'  (1.702 m), weight 109.77 kg (242 lb).Body mass index is 37.89 kg/(m^2).  General Appearance:  casual  Eye Contact::  Poor due to drowsiness  Speech:  Clear and Coherent and Slow  Volume:  Decreased   Mood:  sedated  Affect:  anxious worried  Thought Process:  Coherent and Goal Directed  Orientation:  Full (Time, Place, and Person)  Thought Content:  symptoms events worries concerns  Suicidal Thoughts:  Denies SI  Homicidal Thoughts:  No  Memory:  Immediate;   Fair Recent;   Fair Remote;   Fair  Judgement:  Fair  Insight:  Present  Psychomotor Activity:  Restlessness  Concentration:  Fair  Recall:  [employer] of Knowledge:Fair  Language: Fair  Akathisia:  No  Handed:  Right  AIMS (if indicated):     Assets:  Desire for Improvement Housing Social Support  ADL's:  Intact  Cognition: WNL  Sleep:  Number of Hours: 4.5     Current Medications: Current Facility-Administered Medications  Medication Dose Route Frequency Provider Last Rate Last Dose  . acetaminophen (TYLENOL) tablet 650 mg  650 mg Oral Q6H PRN Waylan Boga, NP      . albuterol (PROVENTIL HFA;VENTOLIN HFA) 108 (90 BASE) MCG/ACT inhaler 1-2 puff  1-2 puff Inhalation Q6H PRN Waylan Boga, NP      . alum & mag hydroxide-simeth (MAALOX/MYLANTA) 200-200-20 MG/5ML suspension 30 mL  30 mL Oral Q4H PRN Waylan Boga, NP   30 mL at 02/14/15 0208  . benzonatate (TESSALON) capsule 100 mg  100 mg Oral TID PRN Samantha Crimes, NP   100 mg at 02/13/15 0350  . carisoprodol (SOMA) tablet 350 mg  350 mg Oral TID Nicholaus Bloom, MD   350 mg at 02/14/15 1157  . clonazePAM (KLONOPIN) tablet 0.5 mg  0.5 mg Oral BID Nicholaus Bloom, MD   0.5 mg at 02/14/15 0800  . clonazePAM (KLONOPIN) tablet 1 mg  1 mg Oral QHS Nicholaus Bloom, MD   1 mg at 02/13/15 2210  . DULoxetine (CYMBALTA) DR capsule 60 mg  60 mg Oral BID Waylan Boga, NP   60 mg at 02/14/15 0800  . loratadine (CLARITIN) tablet 10 mg  10 mg Oral Daily Samantha Crimes, NP   10 mg at 02/14/15 0800  . magnesium hydroxide (MILK OF MAGNESIA) suspension 30 mL  30 mL Oral Daily PRN Waylan Boga, NP      . multivitamin with minerals tablet 1 tablet  1 tablet Oral Daily Encarnacion Slates, NP   1 tablet at 02/14/15 0800  . [START ON 02/15/2015] polyethylene glycol (MIRALAX / GLYCOLAX) packet 17 g  17 g Oral Daily Clinton Sawyer, NP      . propranolol (INDERAL) tablet 20 mg  20 mg Oral BID Waylan Boga, NP   20 mg at 02/14/15 0800  . QUEtiapine (SEROQUEL) tablet 400 mg  400 mg Oral QHS Nicholaus Bloom, MD   400  mg at 02/13/15 2210  . QUEtiapine (SEROQUEL) tablet 50 mg  50 mg Oral TID Nicholaus Bloom, MD   50 mg at 02/14/15 1158  . thiamine (B-1) injection 100 mg  100 mg Intramuscular Once Encarnacion Slates, NP   100 mg at 02/07/15 1330  . thiamine (VITAMIN B-1) tablet 100 mg  100 mg Oral Daily Encarnacion Slates, NP   100 mg at 02/14/15 0759  . thyroid (ARMOUR) tablet 30 mg  30 mg Oral QAC breakfast Waylan Boga, NP   30 mg at 02/14/15 0617  . traZODone (DESYREL) tablet 100 mg  100 mg Oral QHS,MR X 1 Nicholaus Bloom, MD   100 mg at 02/14/15 0124    Lab Results: No results found for this or any previous visit (from the past 81 hour(s)).  Physical Findings: AIMS: Facial and Oral Movements Muscles of Facial Expression: None, normal Lips and Perioral Area: None, normal Jaw: None, normal Tongue: None, normal,Extremity Movements Upper (arms, wrists, hands, fingers): None, normal Lower (legs, knees, ankles, toes): None, normal, Trunk Movements Neck, shoulders, hips: None, normal, Overall Severity Severity of abnormal movements (highest score from questions above): None, normal Incapacitation due to abnormal movements: None, normal Patient's awareness of abnormal movements (rate only patient's report): No Awareness, Dental Status Current problems with teeth and/or dentures?: Yes Does patient usually wear dentures?: No  CIWA:  CIWA-Ar Total: 2 COWS:  COWS Total Score: 1  Treatment Plan Summary: Daily contact with patient to assess and evaluate symptoms and progress in treatment and Medication management Supportive approach/coping skills/relapse prevention Continue to work a relapse  prevention plan  Medical Decision Making:  Review of Psycho-Social Stressors (1), Review of Medication Regimen & Side Effects (2) and Review of New Medication or Change in Dosage (2)   Milta Deiters T. Alesia Oshields RPAC 3:36 PM 02/15/2015

## 2015-02-15 NOTE — Progress Notes (Signed)
Patient did not attend the evening speaker AA meeting.  

## 2015-02-16 MED ORDER — BISACODYL 10 MG RE SUPP
10.0000 mg | Freq: Once | RECTAL | Status: AC
Start: 2015-02-16 — End: 2015-02-16
  Administered 2015-02-16: 10 mg via RECTAL
  Filled 2015-02-16 (×2): qty 1

## 2015-02-16 NOTE — BHH Group Notes (Signed)
[facility] LCSW Group Therapy  02/16/2015 3:44 PM  Type of Therapy:  Group Therapy  Participation Level:  Active  Participation Quality:  Attentive  Affect:  Appropriate  Cognitive:  Alert and Oriented  Insight:  Limited  Engagement in Therapy:  Engaged  Modes of Intervention:  Confrontation, Discussion, Education, Exploration, Problem-solving, Art therapist, Socialization and Support  Summary of Progress/Problems: Today's Topic: Overcoming Obstacles. Pt identified obstacles faced currently and processed barriers involved in overcoming these obstacles. Pt identified steps necessary for overcoming these obstacles and explored motivation (internal and external) for facing these difficulties head on. Pt further identified one area of concern in their lives and chose a skill of focus pulled from their "toolbox." [name] was attentive and engaged during today's processing group. She shared that she plans to get her medical appts in order so that she can take care of herself and make sure she is healthy. "It will get me out of the house too and knowing that I'm doing something to help myself feels good." [name] shows progress in the group setting and improving insight AEB her ability to identify potential triggers for relapse "boredom, getting mad at my boyfriend, and feeling lonely." [name] was also able to talk about ways to avoid these triggers and avoid relapse by "going to the [facility] and exercsiing, going to appts and taking my medicine, and staying in touch with my sister who is supportive."    Smart, Alicia Amel  02/16/2015, 3:44 PM

## 2015-02-16 NOTE — Progress Notes (Signed)
[facility] MD Progress Note  02/16/2015 11:05 AM Erin [name]  MRN:  [id number] Subjective:  Erin [name] is having a hard time today. She is still feels she is not sleeping well. Woke up couple of times during the night. She would like to go back on the Zyprexa. She states she is still "not right" and still does not feel safe to go home. Sh is concerned about constipation. She is also concerned as does not want her pain management MD to know about her use as they might take her off pain pills Principal Problem: <principal problem not specified> Diagnosis:   Patient Active Problem List   Diagnosis Date Noted  . Alcohol dependence with alcohol-induced mood disorder [F10.24]     Priority: High  . Bipolar affective disorder, depressed, severe [F31.4] 02/06/2015    Priority: High  . Opioid dependence [F11.20] 05/06/2014    Priority: High  . Cocaine dependence [F14.20] 07/17/2013    Priority: High  . HYPOTHYROIDISM [E03.9] 03/02/2009    Priority: High  . Bipolar disorder [F31.9] 02/09/2015  . Suicidal ideations [R45.851]   . Thrombocytopenia [D69.6] 01/06/2013  . Sarcoidosis [D86.9] 03/30/2010  . HYPERTENSION, BENIGN ESSENTIAL [I10] 01/11/2010  . ALCOHOLIC LIVER DISEASE [F16.3] 03/06/2008  . CIRRHOSIS, ALCOHOLIC, LIVER [W46.65] 99/35/7017  . TOBACCO DEPENDENCE [Z72.0] 02/22/2007  . EXTERNAL HEMORRHOIDS [K64.8] 01/11/2005  . PORTAL HYPERTENSION [K76.6] 01/11/2005   Total Time spent with patient: 30 minutes   Past Medical History:  Past Medical History  Diagnosis Date  . Hypothyroidism   . Blood transfusion July 2012  . Depression   . Anxiety   . Bipolar 1 disorder   . Menorrhagia   . Liver failure   . Bronchitis   . Cirrhosis of liver   . Wegener's granulomatosis   . History of sarcoidosis     Past Surgical History  Procedure Laterality Date  . Tubal ligation    . Laparoscopy    . Orthopedic surgery    . Cholecystectomy    . Fracture surgery    . Hernia repair    . Vaginal  hysterectomy  07/27/2011    Procedure: HYSTERECTOMY VAGINAL;  Surgeon: [name], MD;  Location: [facility];  Service: Gynecology;  Laterality: N/A;  Vaginal Hysterectomy with Right Oophorectomy  . Laparotomy  07/28/2011    Procedure: EXPLORATORY LAPAROTOMY;  Surgeon: [name], MD;  Location: [facility];  Service: Gynecology;  Laterality: N/A;   Family History:  Family History  Problem Relation Age of Onset  . Diabetes Mother   . Hypertension Mother   . Diabetes Daughter   . Hypertension Daughter    Social History:  History  Alcohol Use  . Yes    Comment: a fifth of vodka and a 12 pk a day of beer      History  Drug Use  . Yes  . Special: Cocaine, Marijuana    Comment: opioids, benzos    History   Social History  . Marital Status: Divorced    Spouse Name: N/A  . Number of Children: N/A  . Years of Education: N/A   Social History Main Topics  . Smoking status: Current Some Day Smoker -- 0.25 packs/day  . Smokeless tobacco: Never Used  . Alcohol Use: Yes     Comment: a fifth of vodka and a 12 pk a day of beer   . Drug Use: Yes    Special: Cocaine, Marijuana     Comment: opioids, benzos  . Sexual Activity: Yes  Birth Control/ Protection: None   Other Topics Concern  . None   Social History Narrative   Additional History:    Sleep: improving objectively   Appetite:  Fair   Assessment:   Musculoskeletal: Strength & Muscle Tone: within normal limits Gait & Station: normal Patient leans: N/A   Psychiatric Specialty Exam: Physical Exam  Review of Systems  Constitutional: Positive for malaise/fatigue.  HENT: Negative.   Eyes: Negative.   Respiratory: Negative.   Cardiovascular: Negative.   Gastrointestinal: Positive for constipation.  Genitourinary: Negative.   Musculoskeletal: Negative.   Skin: Negative.   Neurological: Positive for weakness.  Endo/Heme/Allergies: Negative.   Psychiatric/Behavioral: Positive for depression and substance abuse. The  patient is nervous/anxious and has insomnia.     Blood pressure 87/67, pulse 81, temperature 97.9 F (36.6 C), temperature source Oral, resp. rate 16, height 5\' 7"  (1.702 m), weight 109.77 kg (242 lb).Body mass index is 37.89 kg/(m^2).  General Appearance: Fairly Groomed  Engineer, water::  Fair  Speech:  Clear and Coherent and Slow  Volume:  Decreased  Mood:  Anxious, Depressed and worried  Affect:  Restricted  Thought Process:  Coherent and Goal Directed  Orientation:  Full (Time, Place, and Person)  Thought Content:  symtpoms events worries concerns  Suicidal Thoughts:  No  Homicidal Thoughts:  No  Memory:  Immediate;   Fair Recent;   Fair Remote;   Fair  Judgement:  Fair  Insight:  Shallow  Psychomotor Activity:  Restlessness  Concentration:  Fair  Recall:  [employer] of Knowledge:Fair  Language: Fair  Akathisia:  No  Handed:  Right  AIMS (if indicated):     Assets:  Desire for Improvement  ADL's:  Intact  Cognition: WNL  Sleep:  Number of Hours: 5     Current Medications: Current Facility-Administered Medications  Medication Dose Route Frequency Provider Last Rate Last Dose  . acetaminophen (TYLENOL) tablet 650 mg  650 mg Oral Q6H PRN Waylan Boga, NP      . albuterol (PROVENTIL HFA;VENTOLIN HFA) 108 (90 BASE) MCG/ACT inhaler 1-2 puff  1-2 puff Inhalation Q6H PRN Waylan Boga, NP   2 puff at 02/16/15 [phone]  . alum & mag hydroxide-simeth (MAALOX/MYLANTA) 200-200-20 MG/5ML suspension 30 mL  30 mL Oral Q4H PRN Waylan Boga, NP   30 mL at 02/14/15 0208  . benzonatate (TESSALON) capsule 100 mg  100 mg Oral TID PRN Samantha Crimes, NP   100 mg at 02/15/15 2109  . carisoprodol (SOMA) tablet 350 mg  350 mg Oral TID Nicholaus Bloom, MD   350 mg at 02/16/15 0808  . clonazePAM (KLONOPIN) tablet 0.5 mg  0.5 mg Oral BID Nicholaus Bloom, MD   0.5 mg at 02/16/15 0808  . clonazePAM (KLONOPIN) tablet 1 mg  1 mg Oral QHS Nicholaus Bloom, MD   1 mg at 02/15/15 2109  . DULoxetine (CYMBALTA)  DR capsule 60 mg  60 mg Oral BID Waylan Boga, NP   60 mg at 02/16/15 0808  . loratadine (CLARITIN) tablet 10 mg  10 mg Oral Daily Samantha Crimes, NP   10 mg at 02/16/15 0809  . magnesium hydroxide (MILK OF MAGNESIA) suspension 30 mL  30 mL Oral Daily PRN Waylan Boga, NP      . multivitamin with minerals tablet 1 tablet  1 tablet Oral Daily Encarnacion Slates, NP   1 tablet at 02/16/15 0808  . polyethylene glycol (MIRALAX / GLYCOLAX) packet 17 g  17 g Oral Daily Clinton Sawyer, NP   17 g at 02/16/15 0809  . propranolol (INDERAL) tablet 20 mg  20 mg Oral BID Waylan Boga, NP   20 mg at 02/16/15 0808  . QUEtiapine (SEROQUEL) tablet 400 mg  400 mg Oral QHS Nicholaus Bloom, MD   400 mg at 02/15/15 2110  . QUEtiapine (SEROQUEL) tablet 50 mg  50 mg Oral TID Nicholaus Bloom, MD   50 mg at 02/16/15 7209  . thiamine (B-1) injection 100 mg  100 mg Intramuscular Once Encarnacion Slates, NP   100 mg at 02/07/15 1330  . thiamine (VITAMIN B-1) tablet 100 mg  100 mg Oral Daily Encarnacion Slates, NP   100 mg at 02/16/15 0815  . thyroid (ARMOUR) tablet 30 mg  30 mg Oral QAC breakfast Waylan Boga, NP   30 mg at 02/16/15 0556  . traZODone (DESYREL) tablet 100 mg  100 mg Oral QHS,MR X 1 Nicholaus Bloom, MD   100 mg at 02/16/15 4709    Lab Results: No results found for this or any previous visit (from the past 36 hour(s)).  Physical Findings: AIMS: Facial and Oral Movements Muscles of Facial Expression: None, normal Lips and Perioral Area: None, normal Jaw: None, normal Tongue: None, normal,Extremity Movements Upper (arms, wrists, hands, fingers): None, normal Lower (legs, knees, ankles, toes): None, normal, Trunk Movements Neck, shoulders, hips: None, normal, Overall Severity Severity of abnormal movements (highest score from questions above): None, normal Incapacitation due to abnormal movements: None, normal Patient's awareness of abnormal movements (rate only patient's report): No Awareness, Dental Status Current  problems with teeth and/or dentures?: Yes Does patient usually wear dentures?: No  CIWA:  CIWA-Ar Total: 0 COWS:  COWS Total Score: 1  Treatment Plan Summary: Daily contact with patient to assess and evaluate symptoms and progress in treatment and Medication management Supportive approach/coping skills/relapse prevention Insomnia: wants to go back on Zyprexa what did not work for her before. Will continue the Seroquel at the same dose. She was reassured that she has been off medications for a long time and she has been using drugs and alcohol what has affected her brain chemistry. Reassured that it is going to take time before the medications work and hes mood, her sleep gets regulated Alcohol/Cocaine Dependence: will continue to work a relapse prevention plan and  underscore the need to abstain if she wants her symptoms to get better Constipation: will ask NP to help with recommendations for her constipation. She is concerned as she has hemorrhoids and she is worried that she could bust them open if she was to exert herself Help her get "emotionally" ready tor D/C tommorrow Medical Decision Making:  Review of Psycho-Social Stressors (1), Review of Medication Regimen & Side Effects (2) and Review of New Medication or Change in Dosage (2)     Mccabe Gloria A 02/16/2015, 11:05 AM

## 2015-02-16 NOTE — BHH Group Notes (Signed)
Adult Psychoeducational Group Note  Date:  02/16/2015 Time:  10:07 PM   Participation Level:  Did Not Attend  Additional Comments:  Tonight's group was AA meeting. Staff let pt know group was about to start and pt stated that she would not be attending.   Lavinia Sharps P 02/16/2015, 10:07 PM

## 2015-02-16 NOTE — Progress Notes (Signed)
Patient denied SI & HI, contracts for safety.  Denied A/V hallucinations.  Safety maintained with 15 minute checks.  Emotional support and encouragement given patient.

## 2015-02-16 NOTE — Progress Notes (Signed)
Patient ID: Erin [name], female   DOB: [birth year], 49 y.o.   MRN: [id number] PER STATE REGULATIONS 482.30  THIS CHART WAS REVIEWED FOR MEDICAL NECESSITY WITH RESPECT TO THE PATIENT'S ADMISSION/ DURATION OF STAY.  NEXT REVIEW DATE: 02/18/2015  Chauncy Lean, RN, BSN CASE MANAGER

## 2015-02-16 NOTE — Progress Notes (Signed)
Patient ID: Erin [name], female   DOB: Nov 15, 1966, 49 y.o.   MRN: [id number] D: Patient calm and cooperative this morning.  When discharge was discussed, patient became anxious and upset.  Patient also complaining of constipation.  She was offered a suppository which she has refused.  She denies any SI/HI/AVH.   A: Continue to monitor medication management and MD orders.  Safety checks completed every 15 minutes per protocol.  Meet 1:1 with patient to discuss concerns and offer encouragement. R: Patient's behavior is appropriate to situation.

## 2015-02-16 NOTE — BHH Group Notes (Signed)
[facility] LCSW Aftercare Discharge Planning Group Note   02/16/2015 12:59 PM  Participation Quality:  Appropriate   Mood/Affect:  Appropriate  Depression Rating:  2-3  Anxiety Rating:  2-3  Thoughts of Suicide:  No Will you contract for safety?   NA  Current AVH:  No  Plan for Discharge/Comments:  Pt reports that she wants to stay longer at [facility] and is asking for several referrals that we cannot provide. Pt has been told multiple times to contact her PCP (bieng that she will not allow MD or CSW to contact her PCP) in order for PCP to make referrals for specialists. Pt reporting poor sleep and is requesting additional meds. Pt confused and repeating information-appears to be her baseline. Pt requesting that CSW sit down with her later to go over resources. CSW agreed to meet with pt individually this afternoon.   Transportation Means: bus or boyfriend   Supports: boyfriend   Proofreader, Research officer, trade union

## 2015-02-16 NOTE — BHH Suicide Risk Assessment (Signed)
[facility] INPATIENT:  Family/Significant Other Suicide Prevention Education  Suicide Prevention Education:  Contact Attempts: Erin [name] (pt's boyfriend) [phone] has been identified by the patient as the family member/significant other with whom the patient will be residing, and identified as the person(s) who will aid the patient in the event of a mental health crisis.  With written consent from the patient, two attempts were made to provide suicide prevention education, prior to and/or following the patient's discharge.  We were unsuccessful in providing suicide prevention education.  A suicide education pamphlet was given to the patient to share with family/significant other.  Date and time of first attempt: Friday 02/13/15 2:45 PM (unable to leave vm-mailbox full)  Date and time of second attempt: Monday 02/16/15 3:54PM (unable to leave vm-mailbox full)   Smart, Erin [name] LCSWA  02/16/2015, 3:53 PM

## 2015-02-16 NOTE — Progress Notes (Signed)
Pt attended spiritual care group on grief and loss facilitated by chaplain Jerene Pitch. Group opened with brief discussion and psycho-social ed around grief and loss in relationships and in relation to self - identifying life patterns, circumstances, changes that cause losses. Established group norm of speaking from own life experience. Group goal of establishing open and affirming space for members to share loss and experience with grief, normalize grief experience and provide psycho social education and grief support.  Group drew on narrative and Alderian therapeutic modalities.    [name] was present throughout group.  Spoke with group about grief around losses in life and anger that others "moved on too easily."  Connected losses to substance use / coping through alcohol, and tendency to become overwhelmed and responding in anger.  Received support from group and found several group members that identified with her experience unhelpful coping through substance use.   [place], [place]

## 2015-02-17 DIAGNOSIS — F1010 Alcohol abuse, uncomplicated: Secondary | ICD-10-CM

## 2015-02-17 DIAGNOSIS — F1910 Other psychoactive substance abuse, uncomplicated: Secondary | ICD-10-CM

## 2015-02-17 MED ORDER — ALBUTEROL SULFATE HFA 108 (90 BASE) MCG/ACT IN AERS: 1.0000 | INHALATION_SPRAY | Freq: Four times a day (QID) | RESPIRATORY_TRACT | Status: DC | PRN

## 2015-02-17 MED ORDER — TRAZODONE HCL 100 MG PO TABS: 100.0000 mg | ORAL_TABLET | Freq: Every evening | ORAL | Status: DC | PRN

## 2015-02-17 MED ORDER — DULOXETINE HCL 60 MG PO CPEP: 60.0000 mg | ORAL_CAPSULE | Freq: Two times a day (BID) | ORAL | Status: DC

## 2015-02-17 MED ORDER — LORATADINE 10 MG PO TABS: 10.0000 mg | ORAL_TABLET | Freq: Every day | ORAL | Status: DC

## 2015-02-17 MED ORDER — DOCUSATE SODIUM 100 MG PO CAPS: 100.0000 mg | ORAL_CAPSULE | Freq: Two times a day (BID) | ORAL | Status: DC

## 2015-02-17 MED ORDER — CARISOPRODOL 350 MG PO TABS: ORAL_TABLET | ORAL | Status: DC

## 2015-02-17 MED ORDER — THYROID 30 MG PO TABS: 30.0000 mg | ORAL_TABLET | Freq: Every day | ORAL | Status: DC

## 2015-02-17 MED ORDER — POLYETHYLENE GLYCOL 3350 17 GM/SCOOP PO POWD: 17.0000 g | Freq: Every day | ORAL | Status: DC

## 2015-02-17 MED ORDER — QUETIAPINE FUMARATE 400 MG PO TABS: 400.0000 mg | ORAL_TABLET | Freq: Every day | ORAL | Status: DC

## 2015-02-17 MED ORDER — CLONAZEPAM 1 MG PO TABS: 1.0000 mg | ORAL_TABLET | Freq: Every day | ORAL | Status: DC

## 2015-02-17 MED ORDER — QUETIAPINE FUMARATE 50 MG PO TABS: 50.0000 mg | ORAL_TABLET | Freq: Three times a day (TID) | ORAL | Status: DC

## 2015-02-17 MED ORDER — CLONAZEPAM 0.5 MG PO TABS: 0.5000 mg | ORAL_TABLET | Freq: Two times a day (BID) | ORAL | Status: DC | PRN

## 2015-02-17 MED ORDER — PROPRANOLOL HCL 20 MG PO TABS: 20.0000 mg | ORAL_TABLET | Freq: Two times a day (BID) | ORAL | Status: DC

## 2015-02-17 NOTE — BHH Group Notes (Signed)
[facility] Group Notes:  (Nursing/MHT/Case Management/Adjunct)  Date:  02/17/2015  Time:  0900 am  Type of Therapy:  Psychoeducational Skills  Participation Level:  Active  Participation Quality:  Monopolizing  Affect:  Appropriate  Cognitive:  Alert and Appropriate  Insight:  Improving  Engagement in Group:  Developing/Improving and Monopolizing  Modes of Intervention:  Support  Summary of Progress/Problems: [name] discussed her concerns regarding discharge.  Zipporah Plants 02/17/2015, 9:33 AM

## 2015-02-17 NOTE — Progress Notes (Signed)
Patient ID: Erin [name], female   DOB: 10-06-1966, 49 y.o.   MRN: [id number] Nursing discharge note:  Patient discharged home per MD order.  Patient denies SI/HI/AVH.  Patient received all personal belongings, prescriptions and medication samples.  Follow up appts. Were reviewed and patient indicated understanding.  Patient left ambulatory with two bus tickets.

## 2015-02-17 NOTE — Discharge Summary (Signed)
Physician Discharge Summary Note  Patient:  Erin [name] is an 49 y.o., female MRN:  [id number] DOB:  Aug 26, 1966 Patient phone:  [phone] (home)  Patient address:   [address] [place] [address],  Total Time spent with patient: Greater than 30 minutes  Date of Admission:  02/06/2015 Date of Discharge: 02/17/15  Reason for Admission:  Drug detox & mood stabilization treatments  Principal Problem: <principal problem not specified> Discharge Diagnoses: Patient Active Problem List   Diagnosis Date Noted  . Bipolar disorder [F31.9] 02/09/2015  . Alcohol dependence with alcohol-induced mood disorder [F10.24]   . Bipolar affective disorder, depressed, severe [F31.4] 02/06/2015  . Suicidal ideations [R45.851]   . Opioid dependence [F11.20] 05/06/2014  . Cocaine dependence [F14.20] 07/17/2013  . Thrombocytopenia [D69.6] 01/06/2013  . Sarcoidosis [D86.9] 03/30/2010  . HYPERTENSION, BENIGN ESSENTIAL [I10] 01/11/2010  . HYPOTHYROIDISM [E03.9] 03/02/2009  . ALCOHOLIC LIVER DISEASE [Z60.1] 03/06/2008  . CIRRHOSIS, ALCOHOLIC, LIVER [U93.23] 55/73/2202  . TOBACCO DEPENDENCE [Z72.0] 02/22/2007  . EXTERNAL HEMORRHOIDS [K64.8] 01/11/2005  . PORTAL HYPERTENSION [K76.6] 01/11/2005   Musculoskeletal: Strength & Muscle Tone: within normal limits Gait & Station: normal Patient leans: N/A  Psychiatric Specialty Exam: Physical Exam  Psychiatric: Her speech is normal and behavior is normal. Judgment and thought content normal. Her mood appears not anxious. Her affect is not angry, not blunt, not labile and not inappropriate. Cognition and memory are normal. She does not exhibit a depressed mood.    Review of Systems  Constitutional: Negative.   HENT: Negative.   Eyes: Negative.   Respiratory: Negative.   Cardiovascular: Negative.   Gastrointestinal: Negative.   Genitourinary: Negative.   Musculoskeletal: Negative.   Skin: Negative.   Neurological: Negative.    Endo/Heme/Allergies: Negative.   Psychiatric/Behavioral: Positive for depression (Stable) and substance abuse (Polysubstance dependence). Negative for suicidal ideas and hallucinations. The patient has insomnia (Stable). The patient is not nervous/anxious.     Blood pressure 106/68, pulse 75, temperature 98 F (36.7 C), temperature source Oral, resp. rate 14, height 5\' 7"  (1.702 m), weight 109.77 kg (242 lb).Body mass index is 37.89 kg/(m^2).  See Md's SRA   Past Medical History:  Past Medical History  Diagnosis Date  . Hypothyroidism   . Blood transfusion July 2012  . Depression   . Anxiety   . Bipolar 1 disorder   . Menorrhagia   . Liver failure   . Bronchitis   . Cirrhosis of liver   . Wegener's granulomatosis   . History of sarcoidosis     Past Surgical History  Procedure Laterality Date  . Tubal ligation    . Laparoscopy    . Orthopedic surgery    . Cholecystectomy    . Fracture surgery    . Hernia repair    . Vaginal hysterectomy  07/27/2011    Procedure: HYSTERECTOMY VAGINAL;  Surgeon: [name], MD;  Location: [facility];  Service: Gynecology;  Laterality: N/A;  Vaginal Hysterectomy with Right Oophorectomy  . Laparotomy  07/28/2011    Procedure: EXPLORATORY LAPAROTOMY;  Surgeon: [name], MD;  Location: [facility];  Service: Gynecology;  Laterality: N/A;   Family History:  Family History  Problem Relation Age of Onset  . Diabetes Mother   . Hypertension Mother   . Diabetes Daughter   . Hypertension Daughter    Social History:  History  Alcohol Use  . Yes    Comment: a fifth of vodka and a 12 pk a day of beer  History  Drug Use  . Yes  . Special: Cocaine, Marijuana    Comment: opioids, benzos    History   Social History  . Marital Status: Divorced    Spouse Name: N/A  . Number of Children: N/A  . Years of Education: N/A   Social History Main Topics  . Smoking status: Current Some Day Smoker -- 0.25 packs/day  . Smokeless tobacco: Never Used   . Alcohol Use: Yes     Comment: a fifth of vodka and a 12 pk a day of beer   . Drug Use: Yes    Special: Cocaine, Marijuana     Comment: opioids, benzos  . Sexual Activity: Yes    Birth Control/ Protection: None   Other Topics Concern  . None   Social History Narrative   Risk to Self: What has been your use of drugs/alcohol within the last 12 months?: Alcohol daily; powder and crack cocaine 1-2 times a month Risk to Others: No Prior Inpatient Therapy: No Prior Outpatient Therapy: no  Level of Care:  OP  Hospital Course:  Erin [name] is a 49 year old Caucasian female. Admitted to [facility] from the [facility]. She reports, "The police took me to the hospital ED because I wanted to sign myself in to the hospital. I have been binging on alcohol & cocaine everyday since Christmas. I need to stop. I need help getting into some rehab treatment. I was sober x 12 months prior to relapsing at Christmas. The holidays did me in. I did not have any family to celebrate Christmas with, so I used drugs and drank alcohol to help forget my problems. Right now, my body is doing a lot of shaking & twitching. I feel very anxious. Right now, my depression is at #9 and anxiety at #8".  While a patient in this hospital, Erin [name] received medication management for mood/anxiety stabilization treatment. She did not receive any detoxification treatment because her UDS was positive for cocaine. Cocaine intoxication as of date has no established detoxification protocols. However, she was monitored closely for any substance withdrawal symptoms that may need intervention. She was medicated & discharged on; Cymbalta 60 mg daily for depression, Clonazepam 0.5 mg bid & 1 mg at bedtime for anxiety, Seroquel 50 mg tid & 400 mg at bedtime for agitation/mood control, propranolol 20 mg twice daily for anxiety & Trazodone 100 mg Q bedtime for sleep. Erin [name] was resumed on all her pertinent home medications for other pre-existing  medical conditions. She tolerated her treatment regimen without any adverse effects reported. She was also enrolled and participated in the group counseling sessions being offered and held on this unit. She learned coping skills.   During her course of treatment, Glennette was evaluated each day by a clinical provider to ascertain her response to treatment. Although she had a lot of somatic complaints from consitipation to other generalized symptoms, improvement was noted. She was asked each day to complete a self inventory noting mood, mental status, pain, new symptoms, anxiety and concerns. Her symptoms responded well to her treatment regimen and being in a therapeutic and supportive environment also helped.    On this day of her hospital discharge, Etheleen is in much improved condition than upon admission. She contracted for her safety and felt more in control of her mood. Her symptoms were reported as significantly decreased or resolved completely. She denies any SI/HI and voiced no AVH. She was motivated to continue taking medication with a goal of  continued improvement in mental health. She will follow-up care at the [facility] treatment center here in [place], [place] & the [facility] for her other medical issues. She was provided with a 4 days worth supply sample of her [facility] discharge medications. She left [facility] in no apparent distress with all belongings.Transportation per city bus. [place] assisted with bus pass.  Consults:  psychiatry  Significant Diagnostic Studies:  labs: CBC with diff, CMP, UDS, toxicology tests, U/A, reports reviewed, stable  Discharge Vitals:   Blood pressure 106/68, pulse 75, temperature 98 F (36.7 C), temperature source Oral, resp. rate 14, height 5\' 7"  (1.702 m), weight 109.77 kg (242 lb). Body mass index is 37.89 kg/(m^2). Lab Results:   No results found for this or any previous visit (from the past 72 hour(s)).  Physical Findings: AIMS: Facial and  Oral Movements Muscles of Facial Expression: None, normal Lips and Perioral Area: None, normal Jaw: None, normal Tongue: None, normal,Extremity Movements Upper (arms, wrists, hands, fingers): None, normal Lower (legs, knees, ankles, toes): None, normal, Trunk Movements Neck, shoulders, hips: None, normal, Overall Severity Severity of abnormal movements (highest score from questions above): None, normal Incapacitation due to abnormal movements: None, normal Patient's awareness of abnormal movements (rate only patient's report): No Awareness, Dental Status Current problems with teeth and/or dentures?: Yes Does patient usually wear dentures?: No  CIWA:  CIWA-Ar Total: 1 COWS:  COWS Total Score: 1   See Psychiatric Specialty Exam and Suicide Risk Assessment completed by Attending Physician prior to discharge.  Discharge destination:  Home  Is patient on multiple antipsychotic therapies at discharge:  No   Has Patient had three or more failed trials of antipsychotic monotherapy by history:  No  Recommended Plan for Multiple Antipsychotic Therapies: NA    Medication List    STOP taking these medications        buPROPion 150 MG 12 hr tablet  Commonly known as:  WELLBUTRIN SR     gabapentin 300 MG capsule  Commonly known as:  NEURONTIN     lithium carbonate 300 MG capsule     OLANZapine 5 MG tablet  Commonly known as:  ZYPREXA     zolpidem 5 MG tablet  Commonly known as:  AMBIEN      TAKE these medications      Indication   albuterol 108 (90 BASE) MCG/ACT inhaler  Commonly known as:  PROVENTIL HFA;VENTOLIN HFA  Inhale 1-2 puffs into the lungs every 6 (six) hours as needed for wheezing.   Indication:  Asthma     carisoprodol 350 MG tablet  Commonly known as:  SOMA  Take 1 tablet three times daily: For muscle cramps   Indication:  Musculoskeletal Pain     clonazePAM 0.5 MG tablet  Commonly known as:  KLONOPIN  Take 1 tablet (0.5 mg total) by mouth 2 (two) times  daily as needed for anxiety (anxiety).   Indication:  Anxiety     clonazePAM 1 MG tablet  Commonly known as:  KLONOPIN  Take 1 tablet (1 mg total) by mouth at bedtime. For anxiety/insomnia   Indication:  Anxiety/insomnia     docusate sodium 100 MG capsule  Commonly known as:  COLACE  Take 1 capsule (100 mg total) by mouth 2 (two) times daily. (This is an over the counter medicine): For constipation   Indication:  Constipation     DULoxetine 60 MG capsule  Commonly known as:  CYMBALTA  Take 1 capsule (60 mg total) by  mouth 2 (two) times daily. For depression/muscle pain   Indication:  Major Depressive Disorder, Musculoskeletal Pain     loratadine 10 MG tablet  Commonly known as:  CLARITIN  Take 1 tablet (10 mg total) by mouth daily. (may purchase from over the counter at yr local pharmacy): For allergies   Indication:  Perennial Rhinitis, Hayfever     polyethylene glycol powder powder  Commonly known as:  GLYCOLAX/MIRALAX  Take 17 g by mouth daily. Until daily soft stools: (Over the counter): For constipation   Indication:  Constipation     propranolol 20 MG tablet  Commonly known as:  INDERAL  Take 1 tablet (20 mg total) by mouth 2 (two) times daily. Fr anxiety   Indication:  Anxiety     QUEtiapine 400 MG tablet  Commonly known as:  SEROQUEL  Take 1 tablet (400 mg total) by mouth at bedtime. For mood control   Indication:  Mood control     QUEtiapine 50 MG tablet  Commonly known as:  SEROQUEL  Take 1 tablet (50 mg total) by mouth 3 (three) times daily. For agitation   Indication:  Agitation     thyroid 30 MG tablet  Commonly known as:  ARMOUR  Take 1 tablet (30 mg total) by mouth daily before breakfast. For low thyroid function   Indication:  Underactive Thyroid     traZODone 100 MG tablet  Commonly known as:  DESYREL  Take 1 tablet (100 mg total) by mouth at bedtime and may repeat dose one time if needed. sleep   Indication:  Trouble Sleeping       Follow-up  Information    Follow up with [facility] On 02/23/2015.   Why:  Appt with Roselyn Reef on this date at 11:00AM. Make sure to bring Medicaid card/photo ID to this appt. Appt will last between 1-1 1/2 hours. (assessment for SA IOP/therapy)   Contact information:   [address] [place], [place] [address] Phone: [phone] Fax: [phone]      Follow up with [facility] and [facility] On 02/19/2015.   Why:  New patient appointment on Thursday Feb. 25th at 9 am. Please bring ID and insurance card (or $20 copay if uninsured). Please call office if you need to reschedule.   Contact information:   [address] [place], [place] [address] Phone: [phone] Fax: [phone]     Follow-up recommendations: Activity:  As tolerated Diet: As recommended by your primary care doctor. Keep all scheduled follow-up appointments as recommended.    Comments:  Take all your medications as prescribed by your mental healthcare provider. Report any adverse effects and or reactions from your medicines to your outpatient provider promptly. Patient is instructed and cautioned to not engage in alcohol and or illegal drug use while on prescription medicines. In the event of worsening symptoms, patient is instructed to call the crisis hotline, 911 and or go to the nearest ED for appropriate evaluation and treatment of symptoms. Follow-up with your primary care provider for your other medical issues, concerns and or health care needs.   Total Discharge Time: Greater than 30 minutes  Signed: Encarnacion Slates, PMHNP, FNP-BC 02/17/2015, 11:09 AM  I personally assessed the patient and formulated the plan Geralyn Flash A. Sabra Heck, M.D.

## 2015-02-17 NOTE — Progress Notes (Signed)
  [facility] Adult Case Management Discharge Plan :  Will you be returning to the same living situation after discharge:  Yes,  home At discharge, do you have transportation home?: Yes,  family member or bus (pass in chart) Do you have the ability to pay for your medications: Yes,  Medicare/Medicaid  Release of information consent forms completed and submitted to Medical Records by CSW.  Patient to Follow up at: Follow-up Information    Follow up with [facility] On 02/23/2015.   Why:  Appt with Roselyn Reef on this date at 11:00AM. Make sure to bring Medicaid card/photo ID to this appt. Appt will last between 1-1 1/2 hours. (assessment for SA IOP/therapy)   Contact information:   [address] [place], [place] [address] Phone: [phone] Fax: [phone]      Follow up with [facility] On 02/19/2015.   Why:  New patient appointment on Thursday Feb. 25th at 9 am. Please bring ID and insurance card (or $20 copay if uninsured). Please call office if you need to reschedule.   Contact information:   [address] [place], [place] [address] Phone: [phone] Fax: [phone]      Patient denies SI/HI: Yes,  during group, self report.     Safety Planning and Suicide Prevention discussed: Yes,  SPE completed with pt--contact attempts made with pt's boyfriend. SPI pamphlet provided to pt and she was encouraged to share information with support network, ask questions, and talk about any concerns relating to SPE.  Have you used any form of tobacco in the last 30 days? (Cigarettes, Smokeless Tobacco, Cigars, and/or Pipes): Yes  Has patient been referred to the Quitline?: Patient refused referral  Smart, Alicia Amel  02/17/2015, 10:31 AM

## 2015-02-17 NOTE — Progress Notes (Signed)
Patient ID: Erin [name], female   DOB: 22-Oct-1966, 49 y.o.   MRN: [id number] D: Patient alert and cooperative. Pt denies [place] withdrawal symptoms. Pt c/o of constipation. Pt reports plans to "go to my house" after discharge. Pt mood/affect is depressed and sad. Pt denies SI/HI/AVH. No acute distressed noted at this time.   A: Medications administered as prescribed. Emotional support given and will continue to monitor pt's progress.  R: Patient remains safe and complaint with medications.

## 2015-02-17 NOTE — [facility] Suicide Risk Assessment (Signed)
[facility] Discharge Suicide Risk Assessment   Demographic Factors:  Caucasian  Total Time spent with patient: 30 minutes  Musculoskeletal: Strength & Muscle Tone: within normal limits Gait & Station: normal Patient leans: N/A  Psychiatric Specialty Exam: Physical Exam  Review of Systems  Constitutional: Negative.   HENT: Negative.   Eyes: Negative.   Respiratory: Negative.   Cardiovascular: Negative.   Gastrointestinal: Positive for constipation.  Genitourinary: Negative.   Musculoskeletal: Positive for joint pain.  Skin: Negative.   Neurological: Negative.   Endo/Heme/Allergies: Negative.   Psychiatric/Behavioral: Positive for substance abuse. The patient is nervous/anxious.     Blood pressure 106/68, pulse 75, temperature 98 F (36.7 C), temperature source Oral, resp. rate 14, height 5\' 7"  (1.702 m), weight 109.77 kg (242 lb).Body mass index is 37.89 kg/(m^2).  General Appearance: Fairly Groomed  Engineer, water::  Fair  Speech:  Clear and Coherent and Slow409  Volume:  Decreased  Mood:  Anxious and worried  Affect:  Restricted  Thought Process:  Coherent and Goal Directed  Orientation:  Full (Time, Place, and Person)  Thought Content:  plans as she moves on, worries concerns  Suicidal Thoughts:  No  Homicidal Thoughts:  No  Memory:  Immediate;   Fair Recent;   Fair Remote;   Fair  Judgement:  Fair  Insight:  Shallow  Psychomotor Activity:  Restlessness  Concentration:  Fair  Recall:  [employer] of Knowledge:Fair  Language: Fair  Akathisia:  No  Handed:  Right  AIMS (if indicated):     Assets:  Desire for Improvement Housing  Sleep:  Number of Hours: 5.75  Cognition: WNL  ADL's:  Intact   Have you used any form of tobacco in the last 30 days? (Cigarettes, Smokeless Tobacco, Cigars, and/or Pipes): Yes  Has this patient used any form of tobacco in the last 30 days? (Cigarettes, Smokeless Tobacco, Cigars, and/or Pipes) Yes, A prescription for an FDA-approved tobacco  cessation medication was offered at discharge and the patient refused  Mental Status Per Nursing Assessment::   On Admission:  NA  Current Mental Status by Physician: In full contact with reality. There are no active S/S of withdrawal. No active SI plans or intent. She states that this time around she plans to follow up and expressed commitment to quit drinking and using cocaine.    Loss Factors: Decline in physical health  Historical Factors: Impulsivity  Risk Reduction Factors:   Positive social support  Continued Clinical Symptoms:  Bipolar Disorder:   Bipolar II Alcohol/Substance Abuse/Dependencies  Cognitive Features That Contribute To Risk:  Closed-mindedness, Polarized thinking and Thought constriction (tunnel vision)    Suicide Risk:  Minimal: No identifiable suicidal ideation.  Patients presenting with no risk factors but with morbid ruminations; may be classified as minimal risk based on the severity of the depressive symptoms  Principal Problem: <principal problem not specified> Discharge Diagnoses:  Patient Active Problem List   Diagnosis Date Noted  . Alcohol dependence with alcohol-induced mood disorder [F10.24]     Priority: High  . Bipolar affective disorder, depressed, severe [F31.4] 02/06/2015    Priority: High  . Opioid dependence [F11.20] 05/06/2014    Priority: High  . Cocaine dependence [F14.20] 07/17/2013    Priority: High  . HYPOTHYROIDISM [E03.9] 03/02/2009    Priority: High  . Bipolar disorder [F31.9] 02/09/2015  . Suicidal ideations [R45.851]   . Thrombocytopenia [D69.6] 01/06/2013  . Sarcoidosis [D86.9] 03/30/2010  . HYPERTENSION, BENIGN ESSENTIAL [I10] 01/11/2010  . ALCOHOLIC LIVER  DISEASE [K70.9] 03/06/2008  . CIRRHOSIS, ALCOHOLIC, LIVER [G62.69] 48/54/6270  . TOBACCO DEPENDENCE [Z72.0] 02/22/2007  . EXTERNAL HEMORRHOIDS [K64.8] 01/11/2005  . PORTAL HYPERTENSION [K76.6] 01/11/2005    Follow-up Information    Follow up with [facility] On  02/23/2015.   Why:  Appt with Roselyn Reef on this date at 11:00AM. Make sure to bring Medicaid card/photo ID to this appt. Appt will last between 1-1 1/2 hours. (assessment for SA IOP/therapy)   Contact information:   [address] [place], [place] [address] Phone: [phone] Fax: [phone]      Follow up with [facility] On 02/19/2015.   Why:  New patient appointment on Thursday Feb. 25th at 9 am. Please bring ID and insurance card (or $20 copay if uninsured). Please call office if you need to reschedule.   Contact information:   [address] [place], [place] [address] Phone: [phone] Fax: [phone]      Plan Of Care/Follow-up recommendations:  Activity:  as tolerated Diet:  regular Follow up [place] for her medical concerns and [facility] Is patient on multiple antipsychotic therapies at discharge:  No   Has Patient had three or more failed trials of antipsychotic monotherapy by history:  No  Recommended Plan for Multiple Antipsychotic Therapies: NA    Carmellia Kreisler A 02/17/2015, 12:25 PM

## 2015-02-19 ENCOUNTER — Inpatient Hospital Stay: Payer: Self-pay

## 2015-02-20 NOTE — Progress Notes (Signed)
Patient Discharge Instructions:  After Visit Summary ([facility]):   Faxed to:  02/20/15 Discharge Summary Note:   Faxed to:  02/20/15 Psychiatric Admission Assessment Note:   Faxed to:  02/20/15 Suicide Risk Assessment - Discharge Assessment:   Faxed to:  02/20/15 Faxed/Sent to the Next Level Care provider:  02/20/15 Faxed to [facility] @ [phone] No documentation was faxed to [facility] for [facility].  NO ROI is available.  Patsey Berthold, 02/20/2015, 2:32 PM

## 2015-05-27 ENCOUNTER — Encounter (HOSPITAL_COMMUNITY): Payer: Self-pay

## 2015-05-27 ENCOUNTER — Emergency Department (HOSPITAL_COMMUNITY)
Admission: EM | Admit: 2015-05-27 | Discharge: 2015-05-28 | Disposition: A | Discharge status: Home / Self Care | Payer: Medicare Other | Attending: Emergency Medicine | Admitting: Emergency Medicine

## 2015-05-27 DIAGNOSIS — F1310 Sedative, hypnotic or anxiolytic abuse, uncomplicated: Secondary | ICD-10-CM | POA: Insufficient documentation

## 2015-05-27 DIAGNOSIS — E039 Hypothyroidism, unspecified: Secondary | ICD-10-CM | POA: Insufficient documentation

## 2015-05-27 DIAGNOSIS — F419 Anxiety disorder, unspecified: Secondary | ICD-10-CM | POA: Insufficient documentation

## 2015-05-27 DIAGNOSIS — F1024 Alcohol dependence with alcohol-induced mood disorder: Secondary | ICD-10-CM

## 2015-05-27 DIAGNOSIS — R45851 Suicidal ideations: Secondary | ICD-10-CM | POA: Diagnosis present

## 2015-05-27 DIAGNOSIS — F1110 Opioid abuse, uncomplicated: Secondary | ICD-10-CM | POA: Insufficient documentation

## 2015-05-27 DIAGNOSIS — F1210 Cannabis abuse, uncomplicated: Secondary | ICD-10-CM | POA: Insufficient documentation

## 2015-05-27 DIAGNOSIS — F1424 Cocaine dependence with cocaine-induced mood disorder: Secondary | ICD-10-CM

## 2015-05-27 DIAGNOSIS — F319 Bipolar disorder, unspecified: Secondary | ICD-10-CM | POA: Diagnosis not present

## 2015-05-27 DIAGNOSIS — Z8719 Personal history of other diseases of the digestive system: Secondary | ICD-10-CM | POA: Diagnosis not present

## 2015-05-27 DIAGNOSIS — F1410 Cocaine abuse, uncomplicated: Secondary | ICD-10-CM | POA: Insufficient documentation

## 2015-05-27 DIAGNOSIS — Z862 Personal history of diseases of the blood and blood-forming organs and certain disorders involving the immune mechanism: Secondary | ICD-10-CM | POA: Insufficient documentation

## 2015-05-27 DIAGNOSIS — Z720 Tobacco use: Secondary | ICD-10-CM | POA: Diagnosis not present

## 2015-05-27 DIAGNOSIS — Z8742 Personal history of other diseases of the female genital tract: Secondary | ICD-10-CM | POA: Diagnosis not present

## 2015-05-27 DIAGNOSIS — Z8709 Personal history of other diseases of the respiratory system: Secondary | ICD-10-CM | POA: Diagnosis not present

## 2015-05-27 DIAGNOSIS — Z79899 Other long term (current) drug therapy: Secondary | ICD-10-CM | POA: Insufficient documentation

## 2015-05-27 DIAGNOSIS — Z791 Long term (current) use of non-steroidal anti-inflammatories (NSAID): Secondary | ICD-10-CM | POA: Diagnosis not present

## 2015-05-27 DIAGNOSIS — Z8739 Personal history of other diseases of the musculoskeletal system and connective tissue: Secondary | ICD-10-CM | POA: Insufficient documentation

## 2015-05-27 DIAGNOSIS — F1123 Opioid dependence with withdrawal: Secondary | ICD-10-CM

## 2015-05-27 DIAGNOSIS — F1910 Other psychoactive substance abuse, uncomplicated: Secondary | ICD-10-CM

## 2015-05-27 LAB — COMPREHENSIVE METABOLIC PANEL
ALBUMIN: 4.1 g/dL (ref 3.5–5.0)
ALT: 16 U/L (ref 14–54)
ANION GAP: 10 (ref 5–15)
AST: 30 U/L (ref 15–41)
Alkaline Phosphatase: 92 U/L (ref 38–126)
BUN: 8 mg/dL (ref 6–20)
CO2: 25 mmol/L (ref 22–32)
CREATININE: 0.67 mg/dL (ref 0.44–1.00)
Calcium: 9.1 mg/dL (ref 8.9–10.3)
Chloride: 103 mmol/L (ref 101–111)
GFR calc Af Amer: >60 mL/min (ref >60)
Glucose, Bld: 110 mg/dL — ABNORMAL HIGH (ref 65–99)
Potassium: 4.1 mmol/L (ref 3.5–5.1)
Sodium: 138 mmol/L (ref 135–145)
TOTAL PROTEIN: 7.9 g/dL (ref 6.5–8.1)
Total Bilirubin: 1.4 mg/dL — ABNORMAL HIGH (ref 0.3–1.2)

## 2015-05-27 LAB — CBC
HEMATOCRIT: 40.5 % (ref 36.0–46.0)
HEMOGLOBIN: 14 g/dL (ref 12.0–15.0)
MCH: 32.5 pg (ref 26.0–34.0)
MCHC: 34.6 g/dL (ref 30.0–36.0)
MCV: 94 fL (ref 78.0–100.0)
PLATELETS: 172 10*3/uL (ref 150–400)
RBC: 4.31 MIL/uL (ref 3.87–5.11)
RDW: 14.7 % (ref 11.5–15.5)
WBC: 8.3 10*3/uL (ref 4.0–10.5)

## 2015-05-27 LAB — SALICYLATE LEVEL

## 2015-05-27 LAB — RAPID URINE DRUG SCREEN, HOSP PERFORMED
Amphetamines: NOT DETECTED
Barbiturates: NOT DETECTED
Benzodiazepines: POSITIVE — AB
COCAINE: POSITIVE — AB
OPIATES: POSITIVE — AB
TETRAHYDROCANNABINOL: POSITIVE — AB

## 2015-05-27 LAB — ETHANOL: Alcohol, Ethyl (B): 135 mg/dL — ABNORMAL HIGH (ref <5)

## 2015-05-27 LAB — ACETAMINOPHEN LEVEL

## 2015-05-27 MED ORDER — LORATADINE 10 MG PO TABS
10.0000 mg | ORAL_TABLET | Freq: Every day | ORAL | Status: DC
  Administered 2015-05-27: 10 mg via ORAL
  Filled 2015-05-27 (×2): qty 1

## 2015-05-27 MED ORDER — CLONAZEPAM 1 MG PO TABS: 1.0000 mg | ORAL_TABLET | Freq: Every day | ORAL | Status: DC

## 2015-05-27 MED ORDER — THYROID 30 MG PO TABS
30.0000 mg | ORAL_TABLET | Freq: Every day | ORAL | Status: DC
  Administered 2015-05-27 – 2015-05-28 (×2): 30 mg via ORAL
  Filled 2015-05-27 (×3): qty 1

## 2015-05-27 MED ORDER — QUETIAPINE FUMARATE 300 MG PO TABS
400.0000 mg | ORAL_TABLET | Freq: Every day | ORAL | Status: DC
  Administered 2015-05-27: 400 mg via ORAL
  Filled 2015-05-27 (×2): qty 1

## 2015-05-27 MED ORDER — TRAZODONE HCL 100 MG PO TABS
100.0000 mg | ORAL_TABLET | Freq: Every evening | ORAL | Status: DC | PRN
  Administered 2015-05-27: 100 mg via ORAL
  Filled 2015-05-27: qty 1

## 2015-05-27 MED ORDER — CLONAZEPAM 0.5 MG PO TABS
0.5000 mg | ORAL_TABLET | Freq: Two times a day (BID) | ORAL | Status: DC | PRN
Start: 2015-05-27 — End: 2015-05-28
  Administered 2015-05-27: 0.5 mg via ORAL
  Filled 2015-05-27: qty 1

## 2015-05-27 MED ORDER — ALBUTEROL SULFATE HFA 108 (90 BASE) MCG/ACT IN AERS: 1.0000 | INHALATION_SPRAY | Freq: Four times a day (QID) | RESPIRATORY_TRACT | Status: DC | PRN

## 2015-05-27 MED ORDER — QUETIAPINE FUMARATE 50 MG PO TABS
50.0000 mg | ORAL_TABLET | Freq: Three times a day (TID) | ORAL | Status: DC
  Administered 2015-05-27 (×2): 50 mg via ORAL
  Administered 2015-05-27: 450 mg via ORAL
  Filled 2015-05-27 (×3): qty 1

## 2015-05-27 MED ORDER — PROPRANOLOL HCL 20 MG PO TABS
20.0000 mg | ORAL_TABLET | Freq: Two times a day (BID) | ORAL | Status: DC
  Administered 2015-05-27 (×2): 20 mg via ORAL
  Filled 2015-05-27 (×4): qty 1

## 2015-05-27 NOTE — ED Provider Notes (Signed)
CSN: [id number]     Arrival date & time 05/27/15  0356 History   First MD Initiated Contact with Patient 05/27/15 0501     Chief Complaint  Patient presents with  . Suicidal     (Consider location/radiation/quality/duration/timing/severity/associated sxs/prior Treatment) HPI Comments: Call the police car.  She was feeling suicidal when they arrived to his wielding a knife.  She said it was dull but she admits to cutting her left wrist in a suicide attempt.  She also admits to being an alcoholic, drinking alcohol on a daily basis anything.  She got doesn't care whether it's beer or hard liquor.  She also uses cocaine frequently.  She can remember the last use, but it was relatively short period of time ago.  She admits to feeling suicidal.  She states she's been hospitalized before for suicide attempts.  She can't exactly remember why or with what were exactly when. 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    Past Medical History  Diagnosis Date  . Hypothyroidism   . Blood transfusion July 2012  . Depression   . Anxiety   . Bipolar 1 disorder   . Menorrhagia   . Liver failure   . Bronchitis   . Cirrhosis of liver   . Wegener's granulomatosis   . History of sarcoidosis    Past Surgical History  Procedure Laterality Date  . Tubal ligation    . Laparoscopy    . Orthopedic surgery    . Cholecystectomy    . Fracture surgery    . Hernia repair    . Vaginal hysterectomy  07/27/2011    Procedure: HYSTERECTOMY VAGINAL;  Surgeon: [name], MD;  Location: [facility];  Service: Gynecology;  Laterality: N/A;  Vaginal Hysterectomy with Right Oophorectomy  . Laparotomy  07/28/2011    Procedure: EXPLORATORY LAPAROTOMY;  Surgeon: [name], MD;  Location: [facility];  Service: Gynecology;  Laterality: N/A;   Family History  Problem Relation Age of Onset  . Diabetes Mother   . Hypertension Mother   . Diabetes Daughter   .  Hypertension Daughter    History  Substance Use Topics  . Smoking status: Current Some Day Smoker -- 0.25 packs/day  . Smokeless tobacco: Never Used  . Alcohol Use: Yes     Comment: a fifth of vodka and a 12 pk a day of beer    OB History    Gravida Para Term Preterm AB TAB SAB Ectopic Multiple Living   4 3 3  0 1     3     Review of Systems  Constitutional: Negative for fever.  Respiratory: Negative for shortness of breath.   Cardiovascular: Negative for chest pain.  Gastrointestinal: Negative for nausea and vomiting.  Genitourinary: Negative for dysuria.  Skin: Positive for wound.  Neurological: Negative for dizziness and headaches.  Psychiatric/Behavioral: Positive for suicidal ideas.  All other systems reviewed and are negative.     Allergies  Fluoxetine hcl; Metoclopramide hcl; and Morphine and related  Home Medications   Prior to Admission medications   Medication Sig Start Date End Date Taking? Authorizing Provider  albuterol (PROVENTIL HFA;VENTOLIN HFA) 108 (90 BASE) MCG/ACT inhaler Inhale 1-2 puffs into the lungs every 6 (six) hours as needed for wheezing. 02/17/15 02/17/16 Yes Encarnacion Slates, NP  buPROPion (WELLBUTRIN SR) 150 MG 12 hr tablet Take 150 mg by mouth daily.   Yes Historical Provider, MD  carisoprodol (SOMA) 350 MG tablet Take 1 tablet three times daily: For muscle cramps 02/17/15  Yes Encarnacion Slates, NP  clonazePAM (KLONOPIN) 0.5 MG tablet Take 1 tablet (0.5 mg total) by mouth 2 (two) times daily as needed for anxiety (anxiety). Patient taking differently: Take 0.5 mg by mouth every 6 (six) hours.  02/17/15  Yes Encarnacion Slates, NP  DULoxetine (CYMBALTA) 60 MG capsule Take 1 capsule (60 mg total) by mouth 2 (two) times daily. For depression/muscle pain 02/17/15  Yes Encarnacion Slates, NP  hydrochlorothiazide (HYDRODIURIL) 25 MG tablet Take 25 mg by mouth daily.   Yes Historical Provider, MD  meloxicam (MOBIC) 7.5 MG tablet Take 7.5 mg by mouth daily.   Yes Historical  Provider, MD  QUEtiapine (SEROQUEL) 400 MG tablet Take 1 tablet (400 mg total) by mouth at bedtime. For mood control Patient taking differently: Take 400 mg by mouth 2 (two) times daily. For mood control 02/17/15  Yes Encarnacion Slates, NP  docusate sodium (COLACE) 100 MG capsule Take 1 capsule (100 mg total) by mouth 2 (two) times daily. (This is an over the counter medicine): For constipation 02/17/15   Encarnacion Slates, NP  loratadine (CLARITIN) 10 MG tablet Take 1 tablet (10 mg total) by mouth daily. (may purchase from over the counter at yr local pharmacy): For allergies 02/17/15   Encarnacion Slates, NP  polyethylene glycol powder (GLYCOLAX/MIRALAX) powder Take 17 g by mouth daily. Until daily soft stools: (Over the counter): For constipation 02/17/15   Encarnacion Slates, NP  propranolol (INDERAL) 20 MG tablet Take 1 tablet (20 mg total) by mouth 2 (two) times daily. Fr anxiety 02/17/15   Encarnacion Slates, NP  QUEtiapine (SEROQUEL) 50 MG tablet Take 1 tablet (50 mg total) by mouth 3 (three) times daily. For agitation 02/17/15   Encarnacion Slates, NP  thyroid (ARMOUR) 30 MG tablet Take 1 tablet (30 mg total) by mouth daily before breakfast. For low thyroid function 02/17/15   Encarnacion Slates, NP   BP 124/91 mmHg  Pulse 75  Temp(Src) 98.8 F (37.1 C) (Oral)  Resp 20  Ht 5\' 7"  (1.702 m)  Wt 255 lb (115.667 kg)  BMI 39.93 kg/m2  SpO2 98% Physical Exam  Constitutional: She appears well-developed and well-nourished.  HENT:  Head: Normocephalic.  Eyes: Pupils are equal, round, and reactive to light.  Neck: Normal range of motion.  Cardiovascular: Normal rate and regular rhythm.   Pulmonary/Chest: Effort normal and breath sounds normal.  Abdominal: Soft.  Musculoskeletal: Normal range of motion. She exhibits no edema or tenderness.  Neurological: She is alert.  Skin: Skin is warm. There is erythema.  Patient has superficial abrasion to left wrist, not requiring sutures or even addressing  Psychiatric: Her affect is  inappropriate. Her speech is delayed. She is slowed. Cognition and memory are impaired. She expresses impulsivity. She exhibits a depressed mood.  Vitals reviewed.   ED Course  Procedures (including critical care time) Labs Review Labs Reviewed  ACETAMINOPHEN LEVEL - Abnormal; Notable for the following:    Acetaminophen (Tylenol), Serum <10 (*)    All other components within normal limits  COMPREHENSIVE METABOLIC PANEL - Abnormal; Notable for the following:    Glucose, Bld 110 (*)    Total Bilirubin 1.4 (*)    All other components within normal limits  ETHANOL - Abnormal; Notable for the following:    Alcohol, Ethyl (B) 135 (*)    All other components within  normal limits  URINE RAPID DRUG SCREEN (HOSP PERFORMED) NOT AT [facility] - Abnormal; Notable for the following:    Opiates POSITIVE (*)    Cocaine POSITIVE (*)    Benzodiazepines POSITIVE (*)    Tetrahydrocannabinol POSITIVE (*)    All other components within normal limits  CBC  SALICYLATE LEVEL    Imaging Review No results found.   EKG Interpretation None     We will move patient to psych unit and have her evaluated MDM   Final diagnoses:  None         [name], NP 05/28/15 2043  Varney Biles, MD 05/31/15 1525

## 2015-05-27 NOTE — ED Notes (Addendum)
Pt presents with SI, self destructive behavior, biting herself, pt also had a small piece of metal staple, attempting to cut wrist.  Staple confiscated by [employer] [facility], security at bedside for assistance. [employer] reports pt welding a knife at home.  Pt awake, alert & responsive, no distress noted, Uncooperative, not forthcoming with information.  Denies SI, HI or AV hallucinations.  Pt staring and rolling her eyes during assessment, mumbling also. Reports feeling hopeless.  Monitoring for safety, Q 44min checks in effect.

## 2015-05-27 NOTE — BH Assessment (Signed)
Per Lonn Georgia, patient accepted to [facility] by Dr. Alvino Chapel. Nursing report # is [phone].

## 2015-05-27 NOTE — ED Notes (Signed)
Bed: [facility] Expected date:  Expected time:  Means of arrival:  Comments: Triage 4

## 2015-05-27 NOTE — BH Assessment (Addendum)
Reviewed ED notes prior to initiating assessment. Per ED notes police were called to home and pt was wielding a knife. Pt reported to NP that she was attempting to kill herself via cutting her wrists. Pt reports to abusing etoh and cocaine. Pt was inpt at [facility] for etoh use and depression in Feb. 2016.  Requested cart be placed with pt for assessment.   Assessment to commence shortly.   Pt was moved from Triage to [place] will contact when cart is placed and pt ready for assessment.    Lear Ng, [facility] Triage Specialist 05/27/2015 5:20 AM

## 2015-05-27 NOTE — Consult Note (Signed)
[facility] Consult   Reason for Consult:  Alcohol use disorder, severe, Polysubstance abuse, Bipolar disorder by hx Referring Physician:  EDP Patient Identification: Erin [name] MRN:  [id number] Principal Diagnosis: Alcohol dependence with alcohol-induced mood disorder Diagnosis:   Patient Active Problem List   Diagnosis Date Noted  . Alcohol dependence with alcohol-induced mood disorder [F10.24]     Priority: High  . Cocaine dependence with cocaine-induced mood disorder [F14.24]   . Opioid dependence with withdrawal [F11.23]   . Bipolar disorder [F31.9] 02/09/2015  . Bipolar affective disorder, depressed, severe [F31.4] 02/06/2015  . Suicidal ideations [R45.851]   . Opioid dependence [F11.20] 05/06/2014  . Cocaine dependence [F14.20] 07/17/2013  . Thrombocytopenia [D69.6] 01/06/2013  . Sarcoidosis [D86.9] 03/30/2010  . HYPERTENSION, BENIGN ESSENTIAL [I10] 01/11/2010  . HYPOTHYROIDISM [E03.9] 03/02/2009  . ALCOHOLIC LIVER DISEASE [K87.6] 03/06/2008  . CIRRHOSIS, ALCOHOLIC, LIVER [O11.57] 26/20/3559  . TOBACCO DEPENDENCE [Z72.0] 02/22/2007  . EXTERNAL HEMORRHOIDS [K64.8] 01/11/2005  . PORTAL HYPERTENSION [K76.6] 01/11/2005    Total Time spent with patient: 1 hour  Subjective:   Erin [name] is a 49 y.o. female patient admitted with .Alcohol use disorder, severe, Polysubstance abuse, Bipolar disorder by hx  HPI:  Caucasian female, 49 years old was evaluated for alcohol intoxication and suicidal attempt by superficially cutting her left wrist.  Patient is well known to the service for detox treatment and substance abuse.  Patient also has a hx of Bipolar disorder.  Patient reported that she was intoxicated and was angry after she found out the her boyfriend was cheating on her.  She decided to kill herself by cutting her wrist superficially.  Patient reports that she has not been taking her medications because her Psychiatrist found out that she is using  drugs in her urine and discontinued her medications.   and she has been using drugs and Alcohol to treat her Bipolar disorder.  Her UDS is positive for Cocaine, Marijuana, Opiates and Benzodiazepines and Alcohol level of 135.  Patient is still endorsing suicide and cannot contract for safety.  She denied HI/AVH.  Patient admits to previous suicide attempts by cutting and has been hospitalized several times.   Patient also has a hx of 10 ECT treatment  for depression and last was back 9 years ago at [facility].  Patient has been accepted for admission and we will be seeking bed placement.   HPI Elements:   Location:  Alcohol use disorder, severe, Polysubstance abuse, Bipolar disorder by hxx, Medication non compliant. Quality:  severe. Severity:  severe. Timing:  Acute. Duration:  Chronic mental illness. Context:  Brought in to the hospital after suicide attempt by cutting wrist..  Past Medical History:  Past Medical History  Diagnosis Date  . Hypothyroidism   . Blood transfusion July 2012  . Depression   . Anxiety   . Bipolar 1 disorder   . Menorrhagia   . Liver failure   . Bronchitis   . Cirrhosis of liver   . Wegener's granulomatosis   . History of sarcoidosis     Past Surgical History  Procedure Laterality Date  . Tubal ligation    . Laparoscopy    . Orthopedic surgery    . Cholecystectomy    . Fracture surgery    . Hernia repair    . Vaginal hysterectomy  07/27/2011    Procedure: HYSTERECTOMY VAGINAL;  Surgeon: [name], MD;  Location: [facility];  Service: Gynecology;  Laterality: N/A;  Vaginal Hysterectomy with  Right Oophorectomy  . Laparotomy  07/28/2011    Procedure: EXPLORATORY LAPAROTOMY;  Surgeon: [name], MD;  Location: [facility];  Service: Gynecology;  Laterality: N/A;   Family History:  Family History  Problem Relation Age of Onset  . Diabetes Mother   . Hypertension Mother   . Diabetes Daughter   . Hypertension Daughter    Social History:  History   Alcohol Use  . Yes    Comment: a fifth of vodka and a 12 pk a day of beer      History  Drug Use  . Yes  . Special: Cocaine, Marijuana    Comment: opioids, benzos    History   Social History  . Marital Status: Divorced    Spouse Name: N/A  . Number of Children: N/A  . Years of Education: N/A   Social History Main Topics  . Smoking status: Current Some Day Smoker -- 0.25 packs/day  . Smokeless tobacco: Never Used  . Alcohol Use: Yes     Comment: a fifth of vodka and a 12 pk a day of beer   . Drug Use: Yes    Special: Cocaine, Marijuana     Comment: opioids, benzos  . Sexual Activity: Yes    Birth Control/ Protection: None   Other Topics Concern  . None   Social History Narrative   Additional Social History:    Pain Medications: See PTA Prescriptions: See PTA reports she is not compliant  Over the Counter: See PTA History of alcohol / drug use?: Yes Longest period of sobriety (when/how long): UTA to assess Negative Consequences of Use:  (UTA) Name of Substance 1: etoh  1 - Age of First Use: 12 1 - Amount (size/oz): alcohol binges per 01-2015 assessment pt unable/unwilling to respond today  1 - Frequency: UTA 1 - Duration: UTA 1 - Last Use / Amount: 05-27-15 Name of Substance 2: cocaine 2 - Age of First Use: 42 2 - Amount (size/oz): cocaine binges per last assessment  2 - Frequency: UTA 2 - Duration: UTA 2 - Last Use / Amount: unknown UDS pending, did not respond                  Allergies:   Allergies  Allergen Reactions  . Fluoxetine Hcl Other (See Comments)     hyperactivity  . Metoclopramide Hcl Other (See Comments)    depression  . Morphine And Related Other (See Comments)    High doses cause hallucinations    Labs:  Results for orders placed or performed during the hospital encounter of 05/27/15 (from the past 48 hour(s))  Acetaminophen level     Status: Abnormal   Collection Time: 05/27/15  4:54 AM  Result Value Ref Range    Acetaminophen (Tylenol), Serum <10 (L) 10 - 30 ug/mL    Comment:        THERAPEUTIC CONCENTRATIONS VARY SIGNIFICANTLY. A RANGE OF 10-30 ug/mL MAY BE AN EFFECTIVE CONCENTRATION FOR MANY PATIENTS. HOWEVER, SOME ARE BEST TREATED AT CONCENTRATIONS OUTSIDE THIS RANGE. ACETAMINOPHEN CONCENTRATIONS >150 ug/mL AT 4 HOURS AFTER INGESTION AND >50 ug/mL AT 12 HOURS AFTER INGESTION ARE OFTEN ASSOCIATED WITH TOXIC REACTIONS.   CBC     Status: None   Collection Time: 05/27/15  4:54 AM  Result Value Ref Range   WBC 8.3 4.0 - 10.5 K/uL   RBC 4.31 3.87 - 5.11 MIL/uL   Hemoglobin 14.0 12.0 - 15.0 g/dL   HCT 40.5 36.0 - 46.0 %  MCV 94.0 78.0 - 100.0 fL   MCH 32.5 26.0 - 34.0 pg   MCHC 34.6 30.0 - 36.0 g/dL   RDW 14.7 11.5 - 15.5 %   Platelets 172 150 - 400 K/uL  Comprehensive metabolic panel     Status: Abnormal   Collection Time: 05/27/15  4:54 AM  Result Value Ref Range   Sodium 138 135 - 145 mmol/L   Potassium 4.1 3.5 - 5.1 mmol/L    Comment: SLIGHT HEMOLYSIS HEMOLYSIS AT THIS LEVEL MAY AFFECT RESULT    Chloride 103 101 - 111 mmol/L   CO2 25 22 - 32 mmol/L   Glucose, Bld 110 (H) 65 - 99 mg/dL   BUN 8 6 - 20 mg/dL   Creatinine, Ser 0.67 0.44 - 1.00 mg/dL   Calcium 9.1 8.9 - 10.3 mg/dL   Total Protein 7.9 6.5 - 8.1 g/dL   Albumin 4.1 3.5 - 5.0 g/dL   AST 30 15 - 41 U/L   ALT 16 14 - 54 U/L   Alkaline Phosphatase 92 38 - 126 U/L   Total Bilirubin 1.4 (H) 0.3 - 1.2 mg/dL   GFR calc non Af Amer >60 >60 mL/min   GFR calc Af Amer >60 >60 mL/min    Comment: (NOTE) The eGFR has been calculated using the CKD EPI equation. This calculation has not been validated in all clinical situations. eGFR's persistently <60 mL/min signify possible Chronic Kidney Disease.    Anion gap 10 5 - 15  Ethanol (ETOH)     Status: Abnormal   Collection Time: 05/27/15  4:54 AM  Result Value Ref Range   Alcohol, Ethyl (B) 135 (H) <5 mg/dL    Comment:        LOWEST DETECTABLE LIMIT FOR SERUM ALCOHOL IS  11 mg/dL FOR MEDICAL PURPOSES ONLY   Salicylate level     Status: None   Collection Time: 05/27/15  4:54 AM  Result Value Ref Range   Salicylate Lvl <6.2 2.8 - 30.0 mg/dL  Urine Drug Screen     Status: Abnormal   Collection Time: 05/27/15  8:06 AM  Result Value Ref Range   Opiates POSITIVE (A) NONE DETECTED   Cocaine POSITIVE (A) NONE DETECTED   Benzodiazepines POSITIVE (A) NONE DETECTED   Amphetamines NONE DETECTED NONE DETECTED   Tetrahydrocannabinol POSITIVE (A) NONE DETECTED   Barbiturates NONE DETECTED NONE DETECTED    Comment:        DRUG SCREEN FOR MEDICAL PURPOSES ONLY.  IF CONFIRMATION IS NEEDED FOR ANY PURPOSE, NOTIFY LAB WITHIN 5 DAYS.        LOWEST DETECTABLE LIMITS FOR URINE DRUG SCREEN Drug Class       Cutoff (ng/mL) Amphetamine      1000 Barbiturate      200 Benzodiazepine   563 Tricyclics       893 Opiates          300 Cocaine          300 THC              50     Vitals: Blood pressure 102/74, pulse 81, temperature 98.7 F (37.1 C), temperature source Oral, resp. rate 16, height '5\' 7"'  (1.702 m), weight 115.667 kg (255 lb), SpO2 97 %.  Risk to Self: Suicidal Ideation: Yes-Currently Present Suicidal Intent: Yes-Currently Present Is patient at risk for suicide?: Yes Suicidal Plan?: Yes-Currently Present Specify Current Suicidal Plan: pt was planning on cutting herself with a knife, she reports she called  police because "I changed my mind:" she was trying to cut herself with a staple while in the [facility] Access to Means: Yes Specify Access to Suicidal Means: knife What has been your use of drugs/alcohol within the last 12 months?: Pt did not provide information, but previously reported to drinking heavily and using cocaine How many times?:  ("dozens") Other Self Harm Risks: drug use Triggers for Past Attempts: Unknown Intentional Self Injurious Behavior: Cutting Comment - Self Injurious Behavior: attempting to cut self with staple while in ED Risk to  Others: Homicidal Ideation: No Thoughts of Harm to Others: No Current Homicidal Intent: No Current Homicidal Plan: No Access to Homicidal Means: No Identified Victim: none History of harm to others?: No Assessment of Violence: None Noted Violent Behavior Description: none Does patient have access to weapons?: No Criminal Charges Pending?: No Does patient have a court date: No Prior Inpatient Therapy: Prior Inpatient Therapy: Yes Prior Therapy Dates: mutliple since age 43 Prior Therapy Facilty/Provider(s): [facility], [facility], can not recall others Reason for Treatment: depression, suicide attempts Prior Outpatient Therapy: Prior Outpatient Therapy: Yes Prior Therapy Dates: unknown Prior Therapy Facilty/Provider(s): "everywhere" denies current providers Reason for Treatment: SA, depression Does patient have an ACCT team?: No Does patient have Intensive In-House Services?  : No Does patient have [facility] services? : No Does patient have [facility] services?: No  Current Facility-Administered Medications  Medication Dose Route Frequency Provider Last Rate Last Dose  . albuterol (PROVENTIL HFA;VENTOLIN HFA) 108 (90 BASE) MCG/ACT inhaler 1-2 puff  1-2 puff Inhalation Q6H PRN [name], NP      . clonazePAM Bobbye Charleston) tablet 0.5 mg  0.5 mg Oral BID PRN [name], NP   0.5 mg at 05/27/15 0806  . loratadine (CLARITIN) tablet 10 mg  10 mg Oral Daily [name], NP   10 mg at 05/27/15 1006  . propranolol (INDERAL) tablet 20 mg  20 mg Oral BID [name], NP   20 mg at 05/27/15 1006  . QUEtiapine (SEROQUEL) tablet 400 mg  400 mg Oral QHS [name], NP      . QUEtiapine (SEROQUEL) tablet 50 mg  50 mg Oral TID [name], NP   50 mg at 05/27/15 1006  . thyroid (ARMOUR) tablet 30 mg  30 mg Oral QAC breakfast [name], NP   30 mg at 05/27/15 0727  . traZODone (DESYREL) tablet 100 mg  100 mg Oral QHS,MR X 1 [name], NP       Current Outpatient Prescriptions  Medication Sig Dispense Refill  .  albuterol (PROVENTIL HFA;VENTOLIN HFA) 108 (90 BASE) MCG/ACT inhaler Inhale 1-2 puffs into the lungs every 6 (six) hours as needed for wheezing.    Marland Kitchen buPROPion (WELLBUTRIN SR) 150 MG 12 hr tablet Take 150 mg by mouth daily.    . carisoprodol (SOMA) 350 MG tablet Take 1 tablet three times daily: For muscle cramps 30 tablet 0  . clonazePAM (KLONOPIN) 0.5 MG tablet Take 1 tablet (0.5 mg total) by mouth 2 (two) times daily as needed for anxiety (anxiety). (Patient taking differently: Take 0.5 mg by mouth every 6 (six) hours. ) 10 tablet 0  . DULoxetine (CYMBALTA) 60 MG capsule Take 1 capsule (60 mg total) by mouth 2 (two) times daily. For depression/muscle pain 60 capsule 0  . hydrochlorothiazide (HYDRODIURIL) 25 MG tablet Take 25 mg by mouth daily.    . meloxicam (MOBIC) 7.5 MG tablet Take 7.5 mg by mouth daily.    Marland Kitchen oxyCODONE (OXY IR/ROXICODONE)  5 MG immediate release tablet Take 5 mg by mouth every 6 (six) hours as needed for moderate pain.   0  . QUEtiapine (SEROQUEL) 400 MG tablet Take 1 tablet (400 mg total) by mouth at bedtime. For mood control (Patient taking differently: Take 400 mg by mouth 2 (two) times daily. For mood control) 30 tablet 0  . traZODone (DESYREL) 100 MG tablet Take 1 tablet (100 mg total) by mouth at bedtime and may repeat dose one time if needed. sleep 60 tablet 0  . zolpidem (AMBIEN) 5 MG tablet Take 5 mg by mouth at bedtime.    . docusate sodium (COLACE) 100 MG capsule Take 1 capsule (100 mg total) by mouth 2 (two) times daily. (This is an over the counter medicine): For constipation 10 capsule 0  . loratadine (CLARITIN) 10 MG tablet Take 1 tablet (10 mg total) by mouth daily. (may purchase from over the counter at yr local pharmacy): For allergies    . polyethylene glycol powder (GLYCOLAX/MIRALAX) powder Take 17 g by mouth daily. Until daily soft stools: (Over the counter): For constipation 255 g 0  . propranolol (INDERAL) 20 MG tablet Take 1 tablet (20 mg total) by mouth 2  (two) times daily. Fr anxiety 60 tablet 0  . QUEtiapine (SEROQUEL) 50 MG tablet Take 1 tablet (50 mg total) by mouth 3 (three) times daily. For agitation 90 tablet 0  . thyroid (ARMOUR) 30 MG tablet Take 1 tablet (30 mg total) by mouth daily before breakfast. For low thyroid function 30 tablet 0    Musculoskeletal: Strength & Muscle Tone: within normal limits Gait & Station: normal Patient leans: N/A  Psychiatric Specialty Exam: Physical Exam  Review of Systems  Constitutional: Negative.   HENT: Negative.   Eyes: Negative.   Respiratory: Negative.   Cardiovascular: Negative.   Gastrointestinal: Negative.        Hx of Cirrhosis of the liver  Genitourinary: Negative.   Musculoskeletal: Negative.   Skin:       Superficial cuts to left wrist as suicide attempt  Neurological: Negative.   Endo/Heme/Allergies: Negative.     Blood pressure 102/74, pulse 81, temperature 98.7 F (37.1 C), temperature source Oral, resp. rate 16, height '5\' 7"'  (1.702 m), weight 115.667 kg (255 lb), SpO2 97 %.Body mass index is 39.93 kg/(m^2).  General Appearance: Casual and Disheveled  Eye Contact::  Minimal  Speech:  Clear and Coherent and Normal Rate  Volume:  Normal  Mood:  Anxious, Hopeless, Worthless and helpless  Affect:  Congruent, Depressed and Flat  Thought Process:  Coherent, Goal Directed and Intact  Orientation:  Full (Time, Place, and Person)  Thought Content:  WDL  Suicidal Thoughts:  Yes.  with intent/plan  Homicidal Thoughts:  No  Memory:  Immediate;   Good Recent;   Good Remote;   Good  Judgement:  Impaired  Insight:  Shallow  Psychomotor Activity:  Tremor  Concentration:  Good  Recall:  NA  Fund of Knowledge:Fair  Language: Good  Akathisia:  NA  Handed:  Right  AIMS (if indicated):     Assets:  Desire for Improvement  ADL's:  Intact  Cognition: WNL  Sleep:      Medical Decision Making: Review of Psycho-Social Stressors (1), Established Problem, Worsening (2), Review of  Medication Regimen & Side Effects (2) and Review of New Medication or Change in Dosage (2)  Treatment Plan Summary: Daily contact with patient to assess and evaluate symptoms and progress in treatment and  Medication management  Plan:  Resume Trazodone 100 mg po at bed time for sleep, Seroquel 400 mg po qhs for mood control, 18m po tid for mood control.  Klonopin 0.5 mg po bid for anxiety.  Disposition: Accepted for admission, seek placement.  ODelfin Gant  PMHNP-BC 05/27/2015 2:15 PM Patient seen face-to-face for psychiatric evaluation, chart reviewed and case discussed with the physician extender and developed treatment plan. Reviewed the information documented and agree with the treatment plan. MCorena Pilgrim MD

## 2015-05-27 NOTE — ED Provider Notes (Signed)
[facility] paperwork requested for patient admission. I have reviewed the history of present illness with patient brandishing a knife in the field and exhibiting ongoing self-destructive behavior in the emergency department with reported suicidal ideation. The patient did report directly to me that she was here for trying to cut herself. She awakens to light voice. Her heart is regular. Breath sounds are clear. Abdomen is soft. There is superficial abrasion to the wrist from her attempted injury. She is not exhibiting acute agitation or signs of acute alcohol withdrawal.  Erin Shanks, MD 05/27/15 1536

## 2015-05-27 NOTE — BH Assessment (Signed)
Inpt recommended, no current female [facility] beds. TTS seeking placement. Sent referrals to: Corky Mull, [place], [place] Triage Specialist 05/27/2015 6:50 AM

## 2015-05-27 NOTE — ED Notes (Addendum)
Pt presents with c/o suicidal ideation. Police were called to pt's house and pt was wielding a knife when police arrived on scene. When asked about the knife, pt reports "it was dull, I was seeing if it was sharp enough to cut my steak". Pt does not want to directly answer questions when asked if she is SI. Pt reports no drug use but does admit to ETOH. Pt's eyes are glazed and she is slow to answer questions.

## 2015-05-27 NOTE — ED Notes (Signed)
Pt. and belongings wanded by security 

## 2015-05-27 NOTE — ED Notes (Signed)
Erin [name] states transport will be 05/28/2015

## 2015-05-27 NOTE — ED Notes (Signed)
Pt. Complains of tremors , headache and nausea.  Contracts for safety.

## 2015-05-27 NOTE — BH Assessment (Addendum)
Tele Assessment Note   Erin [name] is an 49 y.o. female. Brought to ED after contacting police stating she had SI. When police arrive pt had a knife. She reports she wanted to kill herself and then "changed my mind and made a bad call." At the time of assessment ED staff had just had to remove a staple from pt that she was using to try to injure herself. During assessment pt was irritable, and did not answer many questions. At times she seemed to refused to participate, then other times appeared to be experiencing thought blocking and would get frustrated and give up. Information provided by pt was very limited. She reports she is upset because her SO was lying to her and cheated on her. She communicated this in broken phrases. "It doesn't just happen" (referring to drinking) "Lies." "When someone lies" "when someone cheats." Pt denies HI, and AVH. Pt denies continued SI but was trying to harm herself in ED. Hx of self injury unknown. Pt reports attempting suicide "Dozens" of times. She has been hospitalized multiple times since age 46 most recently at [facility] in 01/2015.  Pt reports she drinks and uses drugs but would not provide details.   Pt reports recent changes in eating but will not elaborate. She reports she has no OP providers and no supports at this time. She reports she is not compliant with her medications.    Per note from [name], NP and ED provider: HPI Comments: Call the police car. She was feeling suicidal when they arrived to his wielding a knife. She said it was dull but she admits to cutting her left wrist in a suicide attempt. She also admits to being an alcoholic, drinking alcohol on a daily basis anything. She got doesn't care whether it's beer or hard liquor. She also uses cocaine frequently. She can remember the last use, but it was relatively short period of time ago. She admits to feeling suicidal. She states she's been hospitalized before for suicide attempts. She  can't exactly remember why or with what were exactly when. She states she does have a history of alcohol withdrawal seizures  From TTS assessment in 01/2015: Erin [name] is an 49 y.o. female with history of depression, anxiety, and Bipolar I Disorder. The patient has a history of significant alcohol abuse. Today she is requesting detox and wants treatment at [facility].  She states that she has been in alcohol detox multiple times in the past but in the last month and a half she has started drinking again after 8 months of sobriety secondary to her life situation and family stressors. There have been multiple people in the family with alcoholism and a brother whom she found dead in a hotel after suicide. Patient's age of first use is 49 yrs old. Her last drink was last night.   Per ED notes, patient also using multiple different pills. Patient did not admit to use of pills. She did however admit to use of crack cocaine. Last use was last night.   She denies suicidal thoughts or self, she states that she is not hallucinating, she does feel significantly depressed.   Axis I: 303.90 Alcohol Use Disorder, Severe, 304.20 Cocaine Use Disorder, moderate, 296.53 Bipolar I Disorder, most recent episode depressed, hx of 300.00 Unspecified Anxiety Disorder   Past Medical History:  Past Medical History  Diagnosis Date  . Hypothyroidism   . Blood transfusion July 2012  . Depression   . Anxiety   .  Bipolar 1 disorder   . Menorrhagia   . Liver failure   . Bronchitis   . Cirrhosis of liver   . Wegener's granulomatosis   . History of sarcoidosis     Past Surgical History  Procedure Laterality Date  . Tubal ligation    . Laparoscopy    . Orthopedic surgery    . Cholecystectomy    . Fracture surgery    . Hernia repair    . Vaginal hysterectomy  07/27/2011    Procedure: HYSTERECTOMY VAGINAL;  Surgeon: [name], MD;  Location: [facility];  Service: Gynecology;  Laterality: N/A;  Vaginal Hysterectomy with  Right Oophorectomy  . Laparotomy  07/28/2011    Procedure: EXPLORATORY LAPAROTOMY;  Surgeon: [name], MD;  Location: [facility];  Service: Gynecology;  Laterality: N/A;    Family History:  Family History  Problem Relation Age of Onset  . Diabetes Mother   . Hypertension Mother   . Diabetes Daughter   . Hypertension Daughter     Social History:  reports that she has been smoking.  She has never used smokeless tobacco. She reports that she drinks alcohol. She reports that she uses illicit drugs (Cocaine and Marijuana).  Additional Social History:  Alcohol / Drug Use Pain Medications: See PTA Prescriptions: See PTA reports she is not compliant  Over the Counter: See PTA History of alcohol / drug use?: Yes Longest period of sobriety (when/how long): UTA to assess Negative Consequences of Use:  (UTA) Substance #1 Name of Substance 1: etoh  1 - Age of First Use: 12 1 - Amount (size/oz): alcohol binges per 01-2015 assessment pt unable/unwilling to respond today  1 - Frequency: UTA 1 - Duration: UTA 1 - Last Use / Amount: 05-27-15 Substance #2 Name of Substance 2: cocaine 2 - Age of First Use: 42 2 - Amount (size/oz): cocaine binges per last assessment  2 - Frequency: UTA 2 - Duration: UTA 2 - Last Use / Amount: unknown UDS pending, did not respond   CIWA: CIWA-Ar BP: 127/92 mmHg Pulse Rate: 94 COWS:    PATIENT STRENGTHS: (choose at least two) Average or above average intelligence General fund of knowledge  Allergies:  Allergies  Allergen Reactions  . Fluoxetine Hcl Other (See Comments)     hyperactivity  . Metoclopramide Hcl Other (See Comments)    depression  . Morphine And Related Other (See Comments)    High doses cause hallucinations    Home Medications:  (Not in a hospital admission)  OB/GYN Status:  No LMP recorded. Patient has had a hysterectomy.  General Assessment Data Location of Assessment: [facility] TTS Assessment: In system Is this a Tele or  Face-to-Face Assessment?: Tele Assessment Is this an Initial Assessment or a Re-assessment for this encounter?: Initial Assessment Marital status: Long term relationship Is patient pregnant?: No Pregnancy Status: No Living Arrangements: Alone Can pt return to current living arrangement?: Yes Admission Status: Voluntary Is patient capable of signing voluntary admission?: Yes Referral Source: Self/Family/Friend Insurance type: [facility]     Crisis Care Plan Living Arrangements: Alone Name of Psychiatrist: denies Name of Therapist: denies  Education Status Is patient currently in school?: No Current Grade: NA Highest grade of school patient has completed: [employer] Name of school: NA Contact person: NA  Risk to self with the past 6 months Suicidal Ideation: Yes-Currently Present Has patient been a risk to self within the past 6 months prior to admission? : Yes Suicidal Intent: Yes-Currently Present Has patient  had any suicidal intent within the past 6 months prior to admission? : Yes Is patient at risk for suicide?: Yes Suicidal Plan?: Yes-Currently Present Has patient had any suicidal plan within the past 6 months prior to admission? : Yes Specify Current Suicidal Plan: pt was planning on cutting herself with a knife, she reports she called police because "I changed my mind:" she was trying to cut herself with a staple while in the [facility] Access to Means: Yes Specify Access to Suicidal Means: knife What has been your use of drugs/alcohol within the last 12 months?: Pt did not provide information, but previously reported to drinking heavily and using cocaine Previous Attempts/Gestures: Yes How many times?:  ("dozens") Other Self Harm Risks: drug use Triggers for Past Attempts: Unknown Intentional Self Injurious Behavior: Cutting Comment - Self Injurious Behavior: attempting to cut self with staple while in ED Family Suicide History: Unable to assess Recent stressful life event(s):  Conflict (Comment) (reports SO lies and cheats on her) Persecutory voices/beliefs?: No Depression: Yes Depression Symptoms: Despondent, Feeling angry/irritable (SI) Substance abuse history and/or treatment for substance abuse?: Yes Suicide prevention information given to non-admitted patients: Not applicable  Risk to Others within the past 6 months Homicidal Ideation: No Does patient have any lifetime risk of violence toward others beyond the six months prior to admission? : No Thoughts of Harm to Others: No Current Homicidal Intent: No Current Homicidal Plan: No Access to Homicidal Means: No Identified Victim: none History of harm to others?: No Assessment of Violence: None Noted Violent Behavior Description: none Does patient have access to weapons?: No Criminal Charges Pending?: No Does patient have a court date: No Is patient on probation?: No  Psychosis Hallucinations: None noted Delusions: None noted  Mental Status Report Appearance/Hygiene: Disheveled Eye Contact: Poor Motor Activity: Other (Comment) (rolling eyes, making a lot of nonverbal gestures) Speech: Other (Comment) (mumbling, provided little verbal information) Level of Consciousness: Alert Mood: Depressed, Irritable, Preoccupied Affect: Irritable, Labile Anxiety Level:  (UTA) Thought Processes: Circumstantial, Thought Blocking Judgement: Impaired Orientation: Person, Place, Situation Obsessive Compulsive Thoughts/Behaviors: None  Cognitive Functioning Concentration: Decreased Memory: Recent Impaired, Remote Impaired IQ: Average Insight: Poor Impulse Control: Poor Appetite:  (UTA) Sleep: Unable to Assess Vegetative Symptoms: Unable to Assess  ADLScreening [facility] Assessment Services) Patient's cognitive ability adequate to safely complete daily activities?: Yes Patient able to express need for assistance with ADLs?: Yes Independently performs ADLs?: Yes (appropriate for developmental age)  Prior  Inpatient Therapy Prior Inpatient Therapy: Yes Prior Therapy Dates: mutliple since age 72 Prior Therapy Facilty/Provider(s): [facility], [facility], can not recall others Reason for Treatment: depression, suicide attempts  Prior Outpatient Therapy Prior Outpatient Therapy: Yes Prior Therapy Dates: unknown Prior Therapy Facilty/Provider(s): "everywhere" denies current providers Reason for Treatment: SA, depression Does patient have an ACCT team?: No Does patient have Intensive In-House Services?  : No Does patient have [facility] services? : No Does patient have P4CC services?: No  ADL Screening (condition at time of admission) Patient's cognitive ability adequate to safely complete daily activities?: Yes Is the patient deaf or have difficulty hearing?: No Does the patient have difficulty concentrating, remembering, or making decisions?:  (UTA to assess, appear to have some thought blocking ) Patient able to express need for assistance with ADLs?: Yes Does the patient have difficulty dressing or bathing?: No Independently performs ADLs?: Yes (appropriate for developmental age) Does the patient have difficulty walking or climbing stairs?: No Weakness of Legs: None Weakness of Arms/Hands: None  Home Assistive  Devices/Equipment Home Assistive Devices/Equipment: None    Abuse/Neglect Assessment (Assessment to be complete while patient is alone) Physical Abuse: Denies Verbal Abuse: Yes, past (Comment) Sexual Abuse: Denies Exploitation of patient/patient's resources: Denies Self-Neglect: Denies Values / Beliefs Cultural Requests During Hospitalization: None Spiritual Requests During Hospitalization: None   Advance Directives (For Healthcare) Does patient have an advance directive?: No Would patient like information on creating an advanced directive?: No - patient declined information    Additional Information 1:1 In Past 12 Months?: No CIRT Risk: No Elopement Risk: No Does patient have  medical clearance?: No     Disposition:  Per Patriciaann Clan, PA pt meets inpt criteria. Per Luretha Murphy there are currently no female [facility] beds, however, possible discharges are pending. TTS will seek placement. Per Patriciaann Clan, PA IVC should be considered if pt attempts to leave prior to placement.   Informed Dr. Kathrynn Humble of recommendations, including considering IVC if pt attempts to leave.   Informed Latricia RN of plan.     Lear Ng, [facility] Triage Specialist 05/27/2015 6:19 AM  Disposition Initial Assessment Completed for this Encounter: Yes  Mohd. Derflinger M 05/27/2015 6:08 AM

## 2015-05-28 DIAGNOSIS — F319 Bipolar disorder, unspecified: Secondary | ICD-10-CM | POA: Diagnosis not present

## 2015-05-28 NOTE — ED Notes (Signed)
D: Pt at this time denies SI/HI, pt also denies pain and other acute distress. Pt is calm and cooperative. Pt is to be discharged to [facility], pt however verbalizes not wanting to go there. Pt in bed resting.  A: Pt was informed about her IVC status. Comfort and encouragement were also offered. 15 mins checks continues.  R: Pt had a talk with the MD. Agreed willingly to go [facility]. Q15 mins continues

## 2015-05-28 NOTE — ED Notes (Signed)
Pt left willingly with the [facility]. No problems.

## 2015-06-01 DIAGNOSIS — F1420 Cocaine dependence, uncomplicated: Secondary | ICD-10-CM | POA: Insufficient documentation

## 2015-06-02 DIAGNOSIS — F1120 Opioid dependence, uncomplicated: Secondary | ICD-10-CM | POA: Insufficient documentation

## 2015-10-05 ENCOUNTER — Emergency Department (HOSPITAL_COMMUNITY)
Admission: EM | Admit: 2015-10-05 | Discharge: 2015-10-05 | Disposition: A | Discharge status: Home / Self Care | Payer: Medicare Other | Attending: Emergency Medicine | Admitting: Emergency Medicine

## 2015-10-05 ENCOUNTER — Encounter (HOSPITAL_COMMUNITY): Payer: Self-pay | Admitting: *Deleted

## 2015-10-05 ENCOUNTER — Emergency Department (HOSPITAL_COMMUNITY): Payer: Medicare Other

## 2015-10-05 DIAGNOSIS — S5012XA Contusion of left forearm, initial encounter: Secondary | ICD-10-CM | POA: Diagnosis not present

## 2015-10-05 DIAGNOSIS — R6883 Chills (without fever): Secondary | ICD-10-CM | POA: Insufficient documentation

## 2015-10-05 DIAGNOSIS — R140 Abdominal distension (gaseous): Secondary | ICD-10-CM | POA: Insufficient documentation

## 2015-10-05 DIAGNOSIS — R0600 Dyspnea, unspecified: Secondary | ICD-10-CM | POA: Diagnosis not present

## 2015-10-05 DIAGNOSIS — Y999 Unspecified external cause status: Secondary | ICD-10-CM | POA: Insufficient documentation

## 2015-10-05 DIAGNOSIS — F319 Bipolar disorder, unspecified: Secondary | ICD-10-CM | POA: Insufficient documentation

## 2015-10-05 DIAGNOSIS — Z862 Personal history of diseases of the blood and blood-forming organs and certain disorders involving the immune mechanism: Secondary | ICD-10-CM | POA: Insufficient documentation

## 2015-10-05 DIAGNOSIS — E039 Hypothyroidism, unspecified: Secondary | ICD-10-CM | POA: Diagnosis not present

## 2015-10-05 DIAGNOSIS — F419 Anxiety disorder, unspecified: Secondary | ICD-10-CM | POA: Diagnosis not present

## 2015-10-05 DIAGNOSIS — Z8709 Personal history of other diseases of the respiratory system: Secondary | ICD-10-CM | POA: Insufficient documentation

## 2015-10-05 DIAGNOSIS — Z79899 Other long term (current) drug therapy: Secondary | ICD-10-CM | POA: Diagnosis not present

## 2015-10-05 DIAGNOSIS — R000 Tachycardia, unspecified: Secondary | ICD-10-CM | POA: Diagnosis not present

## 2015-10-05 DIAGNOSIS — Y9389 Activity, other specified: Secondary | ICD-10-CM | POA: Insufficient documentation

## 2015-10-05 DIAGNOSIS — W19XXXA Unspecified fall, initial encounter: Secondary | ICD-10-CM | POA: Diagnosis not present

## 2015-10-05 DIAGNOSIS — S60222A Contusion of left hand, initial encounter: Secondary | ICD-10-CM | POA: Diagnosis not present

## 2015-10-05 DIAGNOSIS — M25532 Pain in left wrist: Secondary | ICD-10-CM

## 2015-10-05 DIAGNOSIS — R002 Palpitations: Secondary | ICD-10-CM | POA: Diagnosis not present

## 2015-10-05 DIAGNOSIS — S60212A Contusion of left wrist, initial encounter: Secondary | ICD-10-CM | POA: Diagnosis not present

## 2015-10-05 DIAGNOSIS — Y929 Unspecified place or not applicable: Secondary | ICD-10-CM | POA: Diagnosis not present

## 2015-10-05 DIAGNOSIS — Z791 Long term (current) use of non-steroidal anti-inflammatories (NSAID): Secondary | ICD-10-CM | POA: Diagnosis not present

## 2015-10-05 DIAGNOSIS — Z720 Tobacco use: Secondary | ICD-10-CM | POA: Insufficient documentation

## 2015-10-05 DIAGNOSIS — Z8742 Personal history of other diseases of the female genital tract: Secondary | ICD-10-CM | POA: Diagnosis not present

## 2015-10-05 DIAGNOSIS — R0602 Shortness of breath: Secondary | ICD-10-CM | POA: Diagnosis present

## 2015-10-05 DIAGNOSIS — Z8719 Personal history of other diseases of the digestive system: Secondary | ICD-10-CM | POA: Diagnosis not present

## 2015-10-05 LAB — CBC WITH DIFFERENTIAL/PLATELET
BASOS ABS: 0 10*3/uL (ref 0.0–0.1)
BASOS PCT: 1 %
EOS PCT: 2 %
Eosinophils Absolute: 0 10*3/uL (ref 0.0–0.7)
HEMATOCRIT: 33.8 % — AB (ref 36.0–46.0)
Hemoglobin: 11.7 g/dL — ABNORMAL LOW (ref 12.0–15.0)
LYMPHS ABS: 0.4 10*3/uL — AB (ref 0.7–4.0)
Lymphocytes Relative: 32 %
MCH: 32.3 pg (ref 26.0–34.0)
MCHC: 34.6 g/dL (ref 30.0–36.0)
MCV: 93.4 fL (ref 78.0–100.0)
Monocytes Absolute: 0.1 10*3/uL (ref 0.1–1.0)
Monocytes Relative: 9 %
NEUTROS ABS: 0.7 10*3/uL — AB (ref 1.7–7.7)
Neutrophils Relative %: 56 %
Platelets: 41 10*3/uL — ABNORMAL LOW (ref 150–400)
RBC: 3.62 MIL/uL — ABNORMAL LOW (ref 3.87–5.11)
RDW: 14.5 % (ref 11.5–15.5)
WBC: 1.2 10*3/uL — CL (ref 4.0–10.5)

## 2015-10-05 LAB — COMPREHENSIVE METABOLIC PANEL
ALBUMIN: 3.3 g/dL — AB (ref 3.5–5.0)
ALK PHOS: 144 U/L — AB (ref 38–126)
ALT: 31 U/L (ref 14–54)
AST: 68 U/L — AB (ref 15–41)
Anion gap: 12 (ref 5–15)
BILIRUBIN TOTAL: 2.2 mg/dL — AB (ref 0.3–1.2)
BUN: 17 mg/dL (ref 6–20)
CO2: 24 mmol/L (ref 22–32)
Calcium: 8.6 mg/dL — ABNORMAL LOW (ref 8.9–10.3)
Chloride: 100 mmol/L — ABNORMAL LOW (ref 101–111)
Creatinine, Ser: 0.85 mg/dL (ref 0.44–1.00)
GFR calc Af Amer: >60 mL/min (ref >60)
GFR calc non Af Amer: >60 mL/min (ref >60)
GLUCOSE: 234 mg/dL — AB (ref 65–99)
POTASSIUM: 3.1 mmol/L — AB (ref 3.5–5.1)
Sodium: 136 mmol/L (ref 135–145)
TOTAL PROTEIN: 6.6 g/dL (ref 6.5–8.1)

## 2015-10-05 LAB — URINE MICROSCOPIC-ADD ON

## 2015-10-05 LAB — URINALYSIS, ROUTINE W REFLEX MICROSCOPIC
GLUCOSE, UA: NEGATIVE mg/dL
HGB URINE DIPSTICK: NEGATIVE
Ketones, ur: 15 mg/dL — AB
Nitrite: NEGATIVE
PH: 6 (ref 5.0–8.0)
Protein, ur: NEGATIVE mg/dL
SPECIFIC GRAVITY, URINE: 1.012 (ref 1.005–1.030)
Urobilinogen, UA: 1 mg/dL (ref 0.0–1.0)

## 2015-10-05 LAB — I-STAT TROPONIN, ED: TROPONIN I, POC: 0.07 ng/mL (ref 0.00–0.08)

## 2015-10-05 LAB — I-STAT CG4 LACTIC ACID, ED
LACTIC ACID, VENOUS: 1.1 mmol/L (ref 0.5–2.0)
Lactic Acid, Venous: 1.41 mmol/L (ref 0.5–2.0)

## 2015-10-05 MED ORDER — IOHEXOL 350 MG/ML SOLN
100.0000 mL | Freq: Once | INTRAVENOUS | Status: AC | PRN
  Administered 2015-10-05: 100 mL via INTRAVENOUS

## 2015-10-05 NOTE — ED Notes (Signed)
Patient transported to CT 

## 2015-10-05 NOTE — ED Notes (Addendum)
Pt reports having productive cough, sob and fever today. Appears anxious at triage, HR 150.

## 2015-10-05 NOTE — ED Provider Notes (Signed)
CSN: [id number]     Arrival date & time 10/05/15  1431 History   None    Chief Complaint  Patient presents with  . Shortness of Breath    The history is provided by the patient and medical records.     49 year old female with history of sarcoidosis, cirrhosis, bipolar disorder, depression/anxiety, bronchitis, hypothyroidism, ETOH/polysubstance abuse presenting with shortness of breath. Onset- today. Worsening since onset. Worse with exertion. Now moderate to severe. Associated with cough productive of some phlegm but no blood, chills, palpitations, feelings of anxiety and panic. Not alleviated by home inhaler or rest.   Pt also reports she had mechanical fall earlier in the week and has bruising and pain to left upper extremity that she wants to get checked out.     Past Medical History  Diagnosis Date  . Hypothyroidism   . Blood transfusion July 2012  . Depression   . Anxiety   . Bipolar 1 disorder ([place])   . Menorrhagia   . Liver failure ([place])   . Bronchitis   . Cirrhosis of liver ([place])   . 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 History of sarcoidosis    Past Surgical History  Procedure Laterality Date  . Tubal ligation    . Laparoscopy    . Orthopedic surgery    . Cholecystectomy    . Fracture surgery    . Hernia repair    . Vaginal hysterectomy  07/27/2011    Procedure: HYSTERECTOMY VAGINAL;  Surgeon: [name], MD;  Location: [facility];  Service: Gynecology;  Laterality: N/A;  Vaginal Hysterectomy with Right Oophorectomy  . Laparotomy  07/28/2011    Procedure: EXPLORATORY LAPAROTOMY;  Surgeon: [name], MD;  Location: [facility];  Service: Gynecology;  Laterality: N/A;   Family History  Problem Relation Age of Onset  . Diabetes Mother   . Hypertension Mother   . Diabetes Daughter   . Hypertension Daughter    Social History  Substance Use Topics  . Smoking status: Current Some Day Smoker -- 0.25 packs/day  . Smokeless tobacco: Never Used  . Alcohol Use: Yes      Comment: a fifth of vodka and a 12 pk a day of beer    OB History    Gravida Para Term Preterm AB TAB SAB Ectopic Multiple Living   4 3 3  0 1     3     Review of Systems  Constitutional: Positive for chills. Negative for fever.  HENT: Negative for rhinorrhea.   Eyes: Negative for visual disturbance.  Respiratory: Positive for cough and shortness of breath.   Cardiovascular: Positive for palpitations. Negative for chest pain and leg swelling.  Gastrointestinal: Negative for vomiting and abdominal pain.  Genitourinary: Negative for decreased urine volume.  Musculoskeletal: Positive for joint swelling and arthralgias.  Allergic/Immunologic: Negative for immunocompromised state.  Neurological: Negative for syncope.  Psychiatric/Behavioral: The patient is nervous/anxious.     Allergies  Fluoxetine hcl; Metoclopramide hcl; and Morphine and related  Home Medications   Prior to Admission medications   Medication Sig Start Date End Date Taking? Authorizing Provider  albuterol (PROVENTIL HFA;VENTOLIN HFA) 108 (90 BASE) MCG/ACT inhaler Inhale 1-2 puffs into the lungs every 6 (six) hours as needed for wheezing. 02/17/15 02/17/16 Yes Encarnacion Slates, NP  buPROPion (WELLBUTRIN SR) 150 MG 12 hr tablet Take 150 mg by mouth daily as needed (moody).    Yes Historical Provider, MD  carisoprodol (SOMA) 350 MG tablet Take 1 tablet three  times daily: For muscle cramps Patient taking differently: Take 350 mg by mouth 3 (three) times daily. Take 1 tablet three times daily: For muscle cramps 02/17/15  Yes Encarnacion Slates, NP  clonazePAM (KLONOPIN) 0.5 MG tablet Take 1 tablet (0.5 mg total) by mouth 2 (two) times daily as needed for anxiety (anxiety). Patient taking differently: Take 0.5 mg by mouth every 6 (six) hours.  02/17/15  Yes Encarnacion Slates, NP  docusate sodium (COLACE) 100 MG capsule Take 1 capsule (100 mg total) by mouth 2 (two) times daily. (This is an over the counter medicine): For constipation 02/17/15   Yes Encarnacion Slates, NP  DULoxetine (CYMBALTA) 60 MG capsule Take 1 capsule (60 mg total) by mouth 2 (two) times daily. For depression/muscle pain 02/17/15  Yes Encarnacion Slates, NP  hydrochlorothiazide (HYDRODIURIL) 25 MG tablet Take 25 mg by mouth daily.    Historical Provider, MD  loratadine (CLARITIN) 10 MG tablet Take 1 tablet (10 mg total) by mouth daily. (may purchase from over the counter at yr local pharmacy): For allergies 02/17/15   Encarnacion Slates, NP  meloxicam (MOBIC) 7.5 MG tablet Take 7.5 mg by mouth daily.    Historical Provider, MD  polyethylene glycol powder (GLYCOLAX/MIRALAX) powder Take 17 g by mouth daily. Until daily soft stools: (Over the counter): For constipation 02/17/15   Encarnacion Slates, NP  propranolol (INDERAL) 20 MG tablet Take 1 tablet (20 mg total) by mouth 2 (two) times daily. Fr anxiety 02/17/15   Encarnacion Slates, NP  QUEtiapine (SEROQUEL) 400 MG tablet Take 1 tablet (400 mg total) by mouth at bedtime. For mood control Patient taking differently: Take 400 mg by mouth 2 (two) times daily. For mood control 02/17/15   Encarnacion Slates, NP  QUEtiapine (SEROQUEL) 50 MG tablet Take 1 tablet (50 mg total) by mouth 3 (three) times daily. For agitation 02/17/15   Encarnacion Slates, NP  thyroid (ARMOUR) 30 MG tablet Take 1 tablet (30 mg total) by mouth daily before breakfast. For low thyroid function 02/17/15   Encarnacion Slates, NP   BP 139/86 mmHg  Pulse 93  Temp(Src) 97.2 F (36.2 C) (Oral)  Resp 33  Ht 5\' 7"  (1.702 m)  Wt 240 lb (108.863 kg)  BMI 37.58 kg/m2  SpO2 100% Physical Exam  Constitutional: She is oriented to person, place, and time. She appears well-developed and well-nourished. No distress.  HENT:  Head: Normocephalic and atraumatic.  Eyes: Right eye exhibits no discharge. Left eye exhibits no discharge.  Neck: No tracheal deviation present.  Cardiovascular: Regular rhythm.  Tachycardia present.   Pulmonary/Chest: Effort normal. No respiratory distress (occasional). She has  wheezes. She has no rales. She exhibits no tenderness.  Abdominal: Soft. She exhibits distension. There is no tenderness.  Musculoskeletal: She exhibits tenderness (LUE- faint older contusion to dorsum of hand, wrist, forearm. Intact ROM in all digits and hand / wrist, no crepitus or deformity, 2+ radial, fingers wwp).  Neurological: She is alert and oriented to person, place, and time.  Skin: Skin is warm and dry.  Psychiatric: She has a normal mood and affect. Her behavior is normal.  Nursing note and vitals reviewed.   ED Course  Procedures (including critical care time) Labs Review Labs Reviewed  COMPREHENSIVE METABOLIC PANEL - Abnormal; Notable for the following:    Potassium 3.1 (*)    Chloride 100 (*)    Glucose, Bld 234 (*)    Calcium 8.6 (*)  Albumin 3.3 (*)    AST 68 (*)    Alkaline Phosphatase 144 (*)    Total Bilirubin 2.2 (*)    All other components within normal limits  CBC WITH DIFFERENTIAL/PLATELET - Abnormal; Notable for the following:    WBC 1.2 (*)    RBC 3.62 (*)    Hemoglobin 11.7 (*)    HCT 33.8 (*)    Platelets 41 (*)    Neutro Abs 0.7 (*)    Lymphs Abs 0.4 (*)    All other components within normal limits  URINALYSIS, ROUTINE W REFLEX MICROSCOPIC (NOT AT [facility]) - Abnormal; Notable for the following:    Color, Urine AMBER (*)    Bilirubin Urine SMALL (*)    Ketones, ur 15 (*)    Leukocytes, UA TRACE (*)    All other components within normal limits  URINE MICROSCOPIC-ADD ON - Abnormal; Notable for the following:    Bacteria, UA FEW (*)    All other components within normal limits  CULTURE, BLOOD (ROUTINE X 2)  CULTURE, BLOOD (ROUTINE X 2)  URINE CULTURE  I-STAT CG4 LACTIC ACID, ED  I-STAT CG4 LACTIC ACID, ED  I-STAT TROPOININ, ED    Imaging Review Dg Chest 2 View  10/05/2015   CLINICAL DATA:  Onset of productive cough, fever, and shortness of breath today, current smoker, history of sarcoidosis and cirrhosis  EXAM: CHEST  2 VIEW  COMPARISON:   Chest x-ray of May 11, 2014  FINDINGS: The lungs are adequately inflated. The interstitial markings are coarse in the infrahilar regions bilaterally but this is not new. There is no alveolar infiltrate or pleural effusion. The heart and pulmonary vascularity are normal. The mediastinum is normal in width. The trachea is midline. The bony thorax exhibits no acute abnormality.  IMPRESSION: There is no acute cardiopulmonary abnormality. Coarse infrahilar lung markings bilaterally are chronic and may reflect fibrotic change secondary to tobacco use.   Electronically Signed   By: David  Martinique M.D.   On: 10/05/2015 15:57   Dg Elbow Complete Left  10/05/2015   CLINICAL DATA:  Acute left elbow pain following fall today. Initial encounter.  EXAM: LEFT ELBOW - COMPLETE 3+ VIEW  COMPARISON:  None.  FINDINGS: There is no evidence of fracture, dislocation, or joint effusion. There is no evidence of arthropathy or other focal bone abnormality. Soft tissues are unremarkable.  IMPRESSION: Negative.   Electronically Signed   By: Margarette Canada M.D.   On: 10/05/2015 22:20   Dg Forearm Left  10/05/2015   CLINICAL DATA:  Fall today.  Left arm pain and bruising.  EXAM: LEFT FOREARM - 2 VIEW  COMPARISON:  None.  FINDINGS: There is no evidence of fracture or other focal bone lesions. Soft tissues are unremarkable.  IMPRESSION: Negative.   Electronically Signed   By: Lucienne Capers M.D.   On: 10/05/2015 22:20   Dg Wrist Complete Left  10/05/2015   CLINICAL DATA:  Patient fell today. Left arm pain and left wrist and hand pain.  EXAM: LEFT WRIST - COMPLETE 3+ VIEW  COMPARISON:  None.  FINDINGS: There is no evidence of fracture or dislocation. There is no evidence of arthropathy or other focal bone abnormality. Soft tissues are unremarkable.  IMPRESSION: Negative.   Electronically Signed   By: Lucienne Capers M.D.   On: 10/05/2015 22:19   Ct Angio Chest Pe W/cm &/or Wo Cm  10/05/2015   CLINICAL DATA:  Shortness of breath for  3-4 days, intermittent  for few months. History of hypothyroidism, depression, anxiety, liver failure, sarcoidosis, Wegner's granulomatosis.  EXAM: CT ANGIOGRAPHY CHEST WITH CONTRAST  TECHNIQUE: Multidetector CT imaging of the chest was performed using the standard protocol during bolus administration of intravenous contrast. Multiplanar CT image reconstructions and MIPs were obtained to evaluate the vascular anatomy.  CONTRAST:  170mL OMNIPAQUE IOHEXOL 350 MG/ML SOLN  COMPARISON:  Chest CT dated 09/01/2012 and chest x-ray from earlier same day.  FINDINGS: There is no pulmonary embolism identified within the main, lobar, or central segmental pulmonary arteries. The more peripheral segmental and subsegmental pulmonary arteries are difficult to definitively characterize due to patient breathing motion artifact.  Heart size is normal. No pericardial effusion. Thoracic aorta is normal in caliber. No aortic aneurysm or dissection.  Numerous small and moderately enlarged lymph nodes are present throughout the mediastinum and bilateral perihilar regions, representative lymph node in the right lower paratracheal space measuring 2.8 x 1.9 cm and conglomerate subcarinal lymphadenopathy measuring approximately 3.2 x 2.8 cm. This is likely related to patient's given history of sarcoidosis.  Lungs are clear. No pneumonia. No pulmonary nodule or mass. No pleural effusion. No pneumothorax.  There is mild peribronchial thickening, right greater than left. Trachea and central bronchi otherwise unremarkable.  Liver contours are irregular suggesting underlying cirrhosis, compatible with given history of liver failure. Spleen appears enlarged, incompletely imaged. No acute osseous abnormality.  Review of the MIP images confirms the above findings.  IMPRESSION: 1. No pulmonary embolism seen, with mild study limitations detailed above. 2. Prominent lymphadenopathy within the mediastinum and bilateral perihilar regions, presumably chronic  and likely related to the given history of sarcoidosis. Similar lymphadenopathy was seen on patient's previous chest CT of 09/01/2012. 3. Mild peribronchial thickening bilaterally, right greater than left. This raises the possibility of mild infectious bronchiolitis, however, this peribronchial thickening was also present on the earlier chest CT making it more likely chronic and again presumably related to patient's known sarcoidosis. 4. Lungs otherwise clear. No evidence of pneumonia. No pleural effusion. 5. Heart size is normal.  No pericardial effusion. 6. No aortic aneurysm or dissection.   Electronically Signed   By: Franki Cabot M.D.   On: 10/05/2015 18:35   Dg Hand Complete Left  10/05/2015   CLINICAL DATA:  Acute left hand pain following fall today. Initial encounter.  EXAM: LEFT HAND - COMPLETE 3+ VIEW  COMPARISON:  None.  FINDINGS: There is no evidence of fracture or dislocation. There is no evidence of arthropathy or other focal bone abnormality. Soft tissues are unremarkable.  IMPRESSION: Negative.   Electronically Signed   By: Margarette Canada M.D.   On: 10/05/2015 22:19   I have personally reviewed and evaluated these images and lab results as part of my medical decision-making.   EKG Interpretation   Date/Time:  Monday October 05 2015 14:55:43 EDT Ventricular Rate:  157 PR Interval:  136 QRS Duration: 78 QT Interval:  270 QTC Calculation: 436 R Axis:   60 Text Interpretation:  probable  sinus tachycardia Nonspecific ST  abnormality Abnormal ECG Since last tracing rate faster Confirmed by KNAPP   MD-J, JON ([id number]) on 10/05/2015 3:58:27 PM      MDM   Final diagnoses:  Wrist pain, acute, left  Dyspnea    49 year old female with history of sarcoidosis, cirrhosis, bipolar disorder, depression/anxiety, bronchitis, hypothyroidism, ETOH/polysubstance abuse presenting with shortness of breath. AF, significantly tachycardic to 150s (sinus on EKG) though this decreased without further  tx to 80s. BPs stable.  Workup as above. No PE, new PNA. O2 sats of 100%. Possible that pt had anxiety attack vs substance induced anxiety leading to palpitations/tachycardia/SOB as tachycardia resolved spontaneously and pt reported her "panic attack" was over. Patient also reports she may have been sleeping when attack happened and that she has sleep apnea but has not been wearing an appropriate CPAP mask. Troponin negative, no acute ischemia. No acute bony abnormalities on left upper extremity imaging, neurovascularly intact. Discussed several abnormalities on workup as above including leukopenia, thrombocytopenia (though this has been seen before for this pt and may be reflective of liver disease). Pt in agreement to follow up closely with PCP. Given additional resources for primary care as pt wasn't sure who her PCP was. Dc in stable condition with verbal discharge instructions, return precautions.   Case discussed with Dr. Tomi Bamberger, who oversaw management of this patient.   Ivin Booty, MD 10/06/15 [id number]  Dorie Rank, MD 10/06/15 [phone]

## 2015-10-05 NOTE — ED Notes (Signed)
Pt. Left with all belongings and refused wheelchair. Discharge instructions were reviewed and all questions were answered.  

## 2015-10-05 NOTE — Discharge Instructions (Signed)
°Emergency Department Resource Guide °1) Find a Doctor and Pay Out of Pocket °Although you won't have to find out who is covered by your insurance plan, it is a good idea to ask around and get recommendations. You will then need to call the office and see if the doctor you have chosen will accept you as a new patient and what types of options they offer for patients who are self-pay. Some doctors offer discounts or will set up payment plans for their patients who do not have insurance, but you will need to ask so you aren't surprised when you get to your appointment. ° °2) Contact Your Local Health Department °Not all health departments have doctors that can see patients for sick visits, but many do, so it is worth a call to see if yours does. If you don't know where your local health department is, you can check in your phone book. The CDC also has a tool to help you locate your state's health department, and many state websites also have listings of all of their local health departments. ° °3) Find a Walk-in Clinic °If your illness is not likely to be very severe or complicated, you may want to try a walk in clinic. These are popping up all over the country in pharmacies, drugstores, and shopping centers. They're usually staffed by nurse practitioners or physician assistants that have been trained to treat common illnesses and complaints. They're usually fairly quick and inexpensive. However, if you have serious medical issues or chronic medical problems, these are probably not your best option. ° °No Primary Care Doctor: °- Call [facility] at  832-8000 - they can help you locate a primary care doctor that  accepts your insurance, provides certain services, etc. °- Physician Referral Service- 1-800-533-3463 ° °Chronic Pain Problems: °Organization         Address  Phone   Notes  °[facility]  (336) 297-2271 Patients need to be referred by their primary care doctor.  ° °Medication  Assistance: °Organization         Address  Phone   Notes  °[place] [facility] [address] °[place], [place] [address] (336) 641-8030 --Must be a resident of [place] °-- Must have NO insurance coverage whatsoever (no Medicaid/ Medicare, etc.) °-- The pt. MUST have a primary care doctor that directs their care regularly and follows them in the community °  °[facility]  (866) 331-1348   °[employer]  (888) 892-1162   ° °Agencies that provide inexpensive medical care: °Organization         Address  Phone   Notes  °[facility]  (336) 832-8035   °[facility]    (336) 832-7272   °[facility] [address] °[place], [place] [address] (336) 832-4777   °[facility] [address], °[place] (336) 271-4999   °[facility]    (336) 373-0678   °[facility]    (336) 272-1050   °[facility] ° [address], [place] Phone:  (336) 832-4444, Fax:  (336) 832-4440 Hours of Operation:  9 am - 6 pm, M-F.  Also accepts Medicaid/Medicare and self-pay.  °[facility] ° [address],  Phone: (336) 832-3150, Fax: (336) [phone]. Hours of Operation:  8:30 am - 5:30 pm, M-F.  Also accepts Medicaid and self-pay.  °[facility] [place] [address], [place] Phone: (336) 878-6027   °[facility] [address], [place], [place] (336)723-1848, Ext. 123 Mondays & Thursdays: 7-9 AM.  First 15 patients are seen on a first come, first serve basis. °  ° °Medicaid-accepting [place] Providers: ° °Organization         Address  Phone   Notes  °[facility] [address], [place] (336) 641-2100 Also accepts self-pay patients.  °[facility] [address], [place] ° (336) 856-9996   °[facility] [address], [place]  (336) 288-8857   °[facility] [address] [place] [address], [place] (336) 299-7000   °Veita Bland [address], [place]  ° (336) 373-1557 Only accepts [place] Access Medicaid patients after they have their name applied to their card.  ° °Self-Pay (no insurance) in [place]: ° °Organization         Address  Phone   Notes  °Sickle Cell Patients, [facility] [address], [place] (336) 832-1970   °[facility] [address], [place] (336) 832-4400   °[facility] ° [address], [place] (336) 992-4800   °[facility]/Dr. Osei-Bonsu ° [address] [place] [address], [place] or [address], [place] (336) 841-8500 Phone number for both [place] and [place] locations is the same.  °[facility] [address], [place] (336) 299-0000   °[facility] [address] [place] [address], [place] or [address] (336) 852-7530 °(336) 878-2260   °[facility] [address], [place] (336) 350-1642, phone; (336) 294-5005, fax Sees patients 1st and 3rd Saturday of every month.  Must not qualify for public or private insurance (i.e. Medicaid, Medicare, [place] Health Choice, Veterans' Benefits) • Household income should be no more than 200% of the poverty level •The clinic cannot treat you if you are pregnant or think you are pregnant • Sexually transmitted diseases are not treated at the clinic.  ° ° °Dental Care: °Organization         Address  Phone  Notes  °[place] [facility] [address], [place] (336) 641-6152 Accepts children up to age 21 who are enrolled in Medicaid or [place] Health Choice; pregnant women with a Medicaid card; and children who have applied for Medicaid or [place] Health Choice, but were declined, whose parents can pay a reduced fee at time of service.  °[place]  [facility] [place]  [address], [place] (336) 641-7733 Accepts children up to age 21 who are enrolled in Medicaid or [place] Health Choice; pregnant women with a Medicaid card; and children who have applied for Medicaid or  Health Choice, but were declined, whose parents can pay a reduced fee at time of service.  °[facility] ° [address], [place] (336) 641-4533 Patients are seen by appointment only. Walk-ins are not accepted. [facility] will see patients 18 years of age and older. °Monday - Tuesday (8am-5pm) °Most Wednesdays (8:30-5pm) °$30 per visit, cash only  °[facility] ° [address], [place] (336) 641-4533 Patients are seen by appointment only. Walk-ins are not accepted. [facility] will see patients 18 years of age and older. °One   Wednesday Evening (Monthly: Volunteer Based).  $30 per visit, cash only  °[facility]  (919) 537-3737 for adults; Children under age 4, call Graduate Pediatric Dentistry at (919) [phone]. Children aged 4-14, please call (919) [phone] to request a pediatric application. ° Dental services are provided in all areas of dental care including fillings, crowns and bridges, complete and partial dentures, implants, gum treatment, root canals, and extractions. Preventive care is also provided. Treatment is provided to both adults and children. °Patients are selected via a lottery and there is often a waiting list. °  °[facility] [address], °[place] ° (336) 763-8833 [email or web address].[email or web address] °  °[facility] [address], [place], [place] (336)723-1848, Ext. 123 Second and Fourth Thursday of each month, opens at 6:30 AM; Clinic ends at 9 AM.  Patients are seen on a first-come first-served basis, and a limited number are seen during each clinic.  ° °[facility] ° [address], [place], [place] (336) 723-7904    Eligibility Requirements °You must have lived in [place], [place], or [place] counties for at least the last three months. °  You cannot be eligible for state or federal sponsored healthcare insurance, including Veterans Administration, Medicaid, or Medicare. °  You generally cannot be eligible for healthcare insurance through your employer.  °  How to apply: °Eligibility screenings are held every Tuesday and Wednesday afternoon from 1:00 pm until 4:00 pm. You do not need an appointment for the interview!  °[facility] [address], [place], [place] 336-631-2330   °[facility] [facility]  336-342-8273   °[facility]  336-703-3100   °[facility]  336-570-6415   ° °Behavioral Health Resources in the Community: °Intensive Outpatient Programs °Organization         Address  Phone  Notes  °[facility] [address], [place], [place] 336-878-6098   °[facility] [address], [place], [place] 336-832-9800   °[facility] [address], [place], [place] ° 336-882-2125   °[facility] [address],  °[place], [place] 1-800-853-5163 or 336-641-4981   °Substance Abuse Resources °Organization         Address  Phone  Notes  °[facility]  336-882-2125   °[facility]  336-784-9470   °[facility]  336-285-9073   °[facility]  336-845-3988   °Residential & Outpatient Substance Abuse Program  1-800-659-3381   °Psychological Services °Organization         Address  Phone  Notes  ° Health  336- 832-9600   °[facility]  336- 378-7881   °[facility] [address], [place] 1-800-853-5163 or 336-641-4981   ° °Mobile Crisis Teams °Organization         Address  Phone  Notes  °[facility], Mobile Crisis Care Unit  1-877-626-1772   °Assertive °[facility] ° [address]  [place], [place] 336-834-9664   °Sharon DeEsch [address] °[place] 336-554-5454   ° °Self-Help/Support Groups °Organization         Address  Phone             Notes  °[facility]  - variety of support groups  336- 373-1402 Call for more information  °Narcotics Anonymous (NA), [facility] [address], °[place]  2 meetings at this location  ° °  Residential Treatment Programs °Organization         Address  Phone  Notes  °[facility] [address],    °[place]  1-866-801-8205   °[facility] ° [address], [place], [place] 704-293-8524   °[facility] [address], [place] 336-845-3988 Admissions: 8am-3pm M-F  °[facility] [address]    °[place], [place] 336-841-1104   °[facility] [address], [place], [place] 336-379-7146   °[facility] [address]  °[place], [place] 336-285-9073   °[facility] Intensive Outpatient [address], [place], [place] 336-852-3033   °[facility] [address]  °[place], [place] 1-877-615-2722 or 336-784-9470   °[facility] [address] [place], [place] 336-227-7417 Accepts Medicaid  °[facility] [address]  °[place] 1-800-659-3381 Substance Abuse/Addiction Treatment  ° °[place] Behavioral Health Resources °Organization         Address  Phone  Notes  °[facility]  (888) 581-9988   °Julie Brannon, PhD [address] [place], [place]   (336) 349-5553 or (336) 951-0000   °[facility]   [address] °[place], [place] (336) 349-4454   °[facility] [address], [place], [place] (336) 342-8316 Insurance/Medicaid/sponsorship through [facility]  °[facility] [address]                                    [place], [place] (336) 342-8316 Therapy/tele-psych/case    °[facility] [address]  ° [place], [place] (336) 349-2233    °Dr. Arfeen  (336) 349-4544   °[facility] [place]  [employer] [place] [facility] 1) [address], [place] °2) [address], [place] °3)  [address], [place] (336) 349-3220 °(336) 342-7768 ° °(336) 342-8140   °[place] [facility] (336) 342-1394 or (336) 342-3537 (After Hours)    ° ° °

## 2015-10-07 LAB — URINE CULTURE

## 2015-10-10 LAB — CULTURE, BLOOD (ROUTINE X 2)
CULTURE: NO GROWTH
CULTURE: NO GROWTH

## 2016-01-12 ENCOUNTER — Encounter (HOSPITAL_COMMUNITY): Payer: Self-pay | Admitting: *Deleted

## 2016-01-12 ENCOUNTER — Encounter (HOSPITAL_COMMUNITY): Payer: Self-pay | Admitting: Emergency Medicine

## 2016-01-12 ENCOUNTER — Inpatient Hospital Stay (HOSPITAL_COMMUNITY)
Admission: AD | Admit: 2016-01-12 | Discharge: 2016-01-26 | DRG: 885 | Disposition: A | Discharge status: Home / Self Care | Payer: Medicare Other | Source: Intra-hospital | Attending: Psychiatry | Admitting: Psychiatry

## 2016-01-12 ENCOUNTER — Emergency Department (HOSPITAL_COMMUNITY)
Admission: EM | Admit: 2016-01-12 | Discharge: 2016-01-12 | Disposition: A | Discharge status: Home / Self Care | Payer: Medicare Other | Attending: Physician Assistant | Admitting: Physician Assistant

## 2016-01-12 DIAGNOSIS — Z818 Family history of other mental and behavioral disorders: Secondary | ICD-10-CM

## 2016-01-12 DIAGNOSIS — F1210 Cannabis abuse, uncomplicated: Secondary | ICD-10-CM | POA: Diagnosis not present

## 2016-01-12 DIAGNOSIS — F17200 Nicotine dependence, unspecified, uncomplicated: Secondary | ICD-10-CM | POA: Diagnosis not present

## 2016-01-12 DIAGNOSIS — F419 Anxiety disorder, unspecified: Secondary | ICD-10-CM | POA: Diagnosis present

## 2016-01-12 DIAGNOSIS — M549 Dorsalgia, unspecified: Secondary | ICD-10-CM | POA: Diagnosis present

## 2016-01-12 DIAGNOSIS — Z8249 Family history of ischemic heart disease and other diseases of the circulatory system: Secondary | ICD-10-CM

## 2016-01-12 DIAGNOSIS — Z8742 Personal history of other diseases of the female genital tract: Secondary | ICD-10-CM | POA: Insufficient documentation

## 2016-01-12 DIAGNOSIS — Z8719 Personal history of other diseases of the digestive system: Secondary | ICD-10-CM | POA: Insufficient documentation

## 2016-01-12 DIAGNOSIS — F1020 Alcohol dependence, uncomplicated: Secondary | ICD-10-CM | POA: Diagnosis present

## 2016-01-12 DIAGNOSIS — F1424 Cocaine dependence with cocaine-induced mood disorder: Secondary | ICD-10-CM | POA: Diagnosis present

## 2016-01-12 DIAGNOSIS — Z833 Family history of diabetes mellitus: Secondary | ICD-10-CM

## 2016-01-12 DIAGNOSIS — F1010 Alcohol abuse, uncomplicated: Secondary | ICD-10-CM | POA: Diagnosis not present

## 2016-01-12 DIAGNOSIS — G4700 Insomnia, unspecified: Secondary | ICD-10-CM | POA: Diagnosis present

## 2016-01-12 DIAGNOSIS — F314 Bipolar disorder, current episode depressed, severe, without psychotic features: Principal | ICD-10-CM | POA: Diagnosis present

## 2016-01-12 DIAGNOSIS — R51 Headache: Secondary | ICD-10-CM | POA: Diagnosis present

## 2016-01-12 DIAGNOSIS — Z0283 Encounter for blood-alcohol and blood-drug test: Secondary | ICD-10-CM | POA: Diagnosis present

## 2016-01-12 DIAGNOSIS — Z862 Personal history of diseases of the blood and blood-forming organs and certain disorders involving the immune mechanism: Secondary | ICD-10-CM | POA: Insufficient documentation

## 2016-01-12 DIAGNOSIS — K7030 Alcoholic cirrhosis of liver without ascites: Secondary | ICD-10-CM | POA: Diagnosis present

## 2016-01-12 DIAGNOSIS — I10 Essential (primary) hypertension: Secondary | ICD-10-CM | POA: Diagnosis present

## 2016-01-12 DIAGNOSIS — E039 Hypothyroidism, unspecified: Secondary | ICD-10-CM | POA: Diagnosis present

## 2016-01-12 DIAGNOSIS — F17210 Nicotine dependence, cigarettes, uncomplicated: Secondary | ICD-10-CM | POA: Diagnosis present

## 2016-01-12 DIAGNOSIS — R52 Pain, unspecified: Secondary | ICD-10-CM

## 2016-01-12 DIAGNOSIS — R451 Restlessness and agitation: Secondary | ICD-10-CM | POA: Diagnosis not present

## 2016-01-12 DIAGNOSIS — F1024 Alcohol dependence with alcohol-induced mood disorder: Secondary | ICD-10-CM | POA: Diagnosis present

## 2016-01-12 DIAGNOSIS — R441 Visual hallucinations: Secondary | ICD-10-CM | POA: Diagnosis not present

## 2016-01-12 DIAGNOSIS — Z79899 Other long term (current) drug therapy: Secondary | ICD-10-CM | POA: Diagnosis not present

## 2016-01-12 DIAGNOSIS — Z8709 Personal history of other diseases of the respiratory system: Secondary | ICD-10-CM | POA: Diagnosis not present

## 2016-01-12 DIAGNOSIS — Z8739 Personal history of other diseases of the musculoskeletal system and connective tissue: Secondary | ICD-10-CM | POA: Insufficient documentation

## 2016-01-12 DIAGNOSIS — F319 Bipolar disorder, unspecified: Secondary | ICD-10-CM | POA: Insufficient documentation

## 2016-01-12 DIAGNOSIS — D869 Sarcoidosis, unspecified: Secondary | ICD-10-CM | POA: Diagnosis present

## 2016-01-12 DIAGNOSIS — F1990 Other psychoactive substance use, unspecified, uncomplicated: Secondary | ICD-10-CM

## 2016-01-12 DIAGNOSIS — Z23 Encounter for immunization: Secondary | ICD-10-CM | POA: Diagnosis not present

## 2016-01-12 LAB — CBC WITH DIFFERENTIAL/PLATELET
BASOS ABS: 0.1 10*3/uL (ref 0.0–0.1)
Basophils Relative: 2 %
EOS ABS: 0.1 10*3/uL (ref 0.0–0.7)
EOS PCT: 2 %
HCT: 41.9 % (ref 36.0–46.0)
HEMOGLOBIN: 13.9 g/dL (ref 12.0–15.0)
LYMPHS PCT: 44 %
Lymphs Abs: 1.3 10*3/uL (ref 0.7–4.0)
MCH: 32.4 pg (ref 26.0–34.0)
MCHC: 33.2 g/dL (ref 30.0–36.0)
MCV: 97.7 fL (ref 78.0–100.0)
Monocytes Absolute: 0.2 10*3/uL (ref 0.1–1.0)
Monocytes Relative: 8 %
NEUTROS PCT: 45 %
Neutro Abs: 1.4 10*3/uL — ABNORMAL LOW (ref 1.7–7.7)
PLATELETS: 86 10*3/uL — AB (ref 150–400)
RBC: 4.29 MIL/uL (ref 3.87–5.11)
RDW: 13.6 % (ref 11.5–15.5)
WBC: 3 10*3/uL — AB (ref 4.0–10.5)

## 2016-01-12 LAB — RAPID URINE DRUG SCREEN, HOSP PERFORMED
AMPHETAMINES: NOT DETECTED
BENZODIAZEPINES: NOT DETECTED
Barbiturates: NOT DETECTED
COCAINE: NOT DETECTED
OPIATES: NOT DETECTED
TETRAHYDROCANNABINOL: POSITIVE — AB

## 2016-01-12 LAB — COMPREHENSIVE METABOLIC PANEL
ALK PHOS: 147 U/L — AB (ref 38–126)
ALT: 37 U/L (ref 14–54)
ANION GAP: 16 — AB (ref 5–15)
AST: 101 U/L — ABNORMAL HIGH (ref 15–41)
Albumin: 4.2 g/dL (ref 3.5–5.0)
BILIRUBIN TOTAL: 1.3 mg/dL — AB (ref 0.3–1.2)
BUN: 7 mg/dL (ref 6–20)
CALCIUM: 9 mg/dL (ref 8.9–10.3)
CO2: 28 mmol/L (ref 22–32)
CREATININE: 0.61 mg/dL (ref 0.44–1.00)
Chloride: 102 mmol/L (ref 101–111)
Glucose, Bld: 111 mg/dL — ABNORMAL HIGH (ref 65–99)
Potassium: 3.5 mmol/L (ref 3.5–5.1)
SODIUM: 146 mmol/L — AB (ref 135–145)
TOTAL PROTEIN: 7.8 g/dL (ref 6.5–8.1)

## 2016-01-12 LAB — TSH: TSH: 4.397 u[IU]/mL (ref 0.350–4.500)

## 2016-01-12 LAB — ETHANOL: ALCOHOL ETHYL (B): 370 mg/dL — AB (ref <5)

## 2016-01-12 LAB — SALICYLATE LEVEL: Salicylate Lvl: <4 mg/dL (ref 2.8–30.0)

## 2016-01-12 LAB — ACETAMINOPHEN LEVEL: Acetaminophen (Tylenol), Serum: <10 ug/mL — ABNORMAL LOW (ref 10–30)

## 2016-01-12 MED ORDER — NICOTINE 21 MG/24HR TD PT24
21.0000 mg | MEDICATED_PATCH | Freq: Every day | TRANSDERMAL | Status: DC
  Filled 2016-01-12: qty 1

## 2016-01-12 MED ORDER — VITAMIN B-1 100 MG PO TABS
100.0000 mg | ORAL_TABLET | Freq: Every day | ORAL | Status: DC
  Administered 2016-01-12: 100 mg via ORAL
  Filled 2016-01-12: qty 1

## 2016-01-12 MED ORDER — LORATADINE 10 MG PO TABS
10.0000 mg | ORAL_TABLET | Freq: Every day | ORAL | Status: DC
  Administered 2016-01-13 – 2016-01-26 (×14): 10 mg via ORAL
  Filled 2016-01-12 (×17): qty 1

## 2016-01-12 MED ORDER — LORAZEPAM 1 MG PO TABS
1.0000 mg | ORAL_TABLET | Freq: Once | ORAL | Status: AC
Start: 2016-01-12 — End: 2016-01-12
  Administered 2016-01-12: 1 mg via ORAL
  Filled 2016-01-12: qty 1

## 2016-01-12 MED ORDER — ONDANSETRON HCL 4 MG PO TABS
4.0000 mg | ORAL_TABLET | Freq: Three times a day (TID) | ORAL | Status: DC | PRN
  Administered 2016-01-12: 4 mg via ORAL
  Filled 2016-01-12: qty 1

## 2016-01-12 MED ORDER — MELOXICAM 7.5 MG PO TABS
7.5000 mg | ORAL_TABLET | Freq: Every day | ORAL | Status: DC
  Administered 2016-01-13: 7.5 mg via ORAL
  Filled 2016-01-12 (×3): qty 1

## 2016-01-12 MED ORDER — DULOXETINE HCL 60 MG PO CPEP
60.0000 mg | ORAL_CAPSULE | Freq: Two times a day (BID) | ORAL | Status: DC
  Administered 2016-01-12 – 2016-01-13 (×2): 60 mg via ORAL
  Filled 2016-01-12 (×5): qty 1

## 2016-01-12 MED ORDER — HYDROCHLOROTHIAZIDE 25 MG PO TABS
25.0000 mg | ORAL_TABLET | Freq: Every day | ORAL | Status: DC
  Administered 2016-01-13 – 2016-01-26 (×13): 25 mg via ORAL
  Filled 2016-01-12 (×15): qty 1

## 2016-01-12 MED ORDER — MELOXICAM 7.5 MG PO TABS
7.5000 mg | ORAL_TABLET | Freq: Every day | ORAL | Status: DC
  Administered 2016-01-12: 7.5 mg via ORAL
  Filled 2016-01-12: qty 1

## 2016-01-12 MED ORDER — QUETIAPINE FUMARATE 50 MG PO TABS
50.0000 mg | ORAL_TABLET | Freq: Three times a day (TID) | ORAL | Status: DC
  Administered 2016-01-12: 50 mg via ORAL
  Filled 2016-01-12: qty 1

## 2016-01-12 MED ORDER — LORAZEPAM 1 MG PO TABS: 1.0000 mg | ORAL_TABLET | Freq: Three times a day (TID) | ORAL | Status: DC | PRN

## 2016-01-12 MED ORDER — TRAZODONE HCL 100 MG PO TABS: 100.0000 mg | ORAL_TABLET | Freq: Every day | ORAL | Status: DC

## 2016-01-12 MED ORDER — CLONAZEPAM 0.5 MG PO TABS: 0.5000 mg | ORAL_TABLET | Freq: Two times a day (BID) | ORAL | Status: DC | PRN

## 2016-01-12 MED ORDER — PROPRANOLOL HCL 20 MG PO TABS
20.0000 mg | ORAL_TABLET | Freq: Two times a day (BID) | ORAL | Status: DC
  Administered 2016-01-12: 20 mg via ORAL
  Filled 2016-01-12 (×2): qty 1

## 2016-01-12 MED ORDER — THYROID 30 MG PO TABS
30.0000 mg | ORAL_TABLET | Freq: Every day | ORAL | Status: DC
Start: 2016-01-12 — End: 2016-01-12
  Administered 2016-01-12: 30 mg via ORAL
  Filled 2016-01-12 (×2): qty 1

## 2016-01-12 MED ORDER — ZOLPIDEM TARTRATE 5 MG PO TABS: 5.0000 mg | ORAL_TABLET | Freq: Every evening | ORAL | Status: DC | PRN

## 2016-01-12 MED ORDER — THYROID 30 MG PO TABS
30.0000 mg | ORAL_TABLET | Freq: Every day | ORAL | Status: DC
  Administered 2016-01-13 – 2016-01-26 (×14): 30 mg via ORAL
  Filled 2016-01-12 (×15): qty 1

## 2016-01-12 MED ORDER — PROPRANOLOL HCL 20 MG PO TABS
20.0000 mg | ORAL_TABLET | Freq: Two times a day (BID) | ORAL | Status: DC
  Administered 2016-01-12 – 2016-01-26 (×24): 20 mg via ORAL
  Filled 2016-01-12 (×5): qty 1
  Filled 2016-01-12: qty 2
  Filled 2016-01-12 (×25): qty 1

## 2016-01-12 MED ORDER — ALBUTEROL SULFATE HFA 108 (90 BASE) MCG/ACT IN AERS: 1.0000 | INHALATION_SPRAY | Freq: Four times a day (QID) | RESPIRATORY_TRACT | Status: DC | PRN

## 2016-01-12 MED ORDER — HALOPERIDOL LACTATE 5 MG/ML IJ SOLN
5.0000 mg | Freq: Once | INTRAMUSCULAR | Status: DC | PRN
Start: 2016-01-12 — End: 2016-01-12

## 2016-01-12 MED ORDER — QUETIAPINE FUMARATE 300 MG PO TABS: 400.0000 mg | ORAL_TABLET | Freq: Every day | ORAL | Status: DC

## 2016-01-12 MED ORDER — ALUM & MAG HYDROXIDE-SIMETH 200-200-20 MG/5ML PO SUSP: 30.0000 mL | ORAL | Status: DC | PRN

## 2016-01-12 MED ORDER — IBUPROFEN 600 MG PO TABS
600.0000 mg | ORAL_TABLET | Freq: Three times a day (TID) | ORAL | Status: DC | PRN
  Administered 2016-01-13 (×2): 600 mg via ORAL
  Filled 2016-01-12 (×2): qty 1

## 2016-01-12 MED ORDER — LORAZEPAM 1 MG PO TABS: 0.0000 mg | ORAL_TABLET | Freq: Two times a day (BID) | ORAL | Status: DC

## 2016-01-12 MED ORDER — DULOXETINE HCL 60 MG PO CPEP
60.0000 mg | ORAL_CAPSULE | Freq: Two times a day (BID) | ORAL | Status: DC
  Administered 2016-01-12: 60 mg via ORAL
  Filled 2016-01-12 (×3): qty 1

## 2016-01-12 MED ORDER — CLONAZEPAM 0.5 MG PO TABS
0.5000 mg | ORAL_TABLET | Freq: Once | ORAL | Status: AC
  Administered 2016-01-12: 0.5 mg via ORAL
  Filled 2016-01-12: qty 1

## 2016-01-12 MED ORDER — GABAPENTIN 300 MG PO CAPS
300.0000 mg | ORAL_CAPSULE | Freq: Two times a day (BID) | ORAL | Status: DC
  Administered 2016-01-12 – 2016-01-14 (×5): 300 mg via ORAL
  Filled 2016-01-12 (×9): qty 1

## 2016-01-12 MED ORDER — THIAMINE HCL 100 MG/ML IJ SOLN: 100.0000 mg | Freq: Every day | INTRAMUSCULAR | Status: DC

## 2016-01-12 MED ORDER — BUPROPION HCL ER (SR) 150 MG PO TB12
150.0000 mg | ORAL_TABLET | Freq: Every day | ORAL | Status: DC | PRN
Start: 2016-01-12 — End: 2016-01-13

## 2016-01-12 MED ORDER — VITAMIN B-1 100 MG PO TABS
100.0000 mg | ORAL_TABLET | Freq: Every day | ORAL | Status: DC
  Administered 2016-01-13: 100 mg via ORAL
  Filled 2016-01-12 (×2): qty 1

## 2016-01-12 MED ORDER — CARISOPRODOL 350 MG PO TABS
350.0000 mg | ORAL_TABLET | Freq: Three times a day (TID) | ORAL | Status: DC
  Administered 2016-01-12: 350 mg via ORAL
  Filled 2016-01-12: qty 1

## 2016-01-12 MED ORDER — LORAZEPAM 1 MG PO TABS
1.0000 mg | ORAL_TABLET | Freq: Three times a day (TID) | ORAL | Status: DC | PRN
  Administered 2016-01-13: 1 mg via ORAL
  Filled 2016-01-12: qty 1

## 2016-01-12 MED ORDER — LORATADINE 10 MG PO TABS
10.0000 mg | ORAL_TABLET | Freq: Every day | ORAL | Status: DC
  Administered 2016-01-12: 10 mg via ORAL
  Filled 2016-01-12: qty 1

## 2016-01-12 MED ORDER — BUPROPION HCL ER (SR) 150 MG PO TB12
150.0000 mg | ORAL_TABLET | Freq: Every day | ORAL | Status: DC | PRN
  Filled 2016-01-12: qty 1

## 2016-01-12 MED ORDER — QUETIAPINE FUMARATE 400 MG PO TABS
400.0000 mg | ORAL_TABLET | Freq: Every day | ORAL | Status: DC
Start: 2016-01-12 — End: 2016-01-13
  Administered 2016-01-12: 400 mg via ORAL
  Filled 2016-01-12: qty 1
  Filled 2016-01-12: qty 2
  Filled 2016-01-12: qty 1

## 2016-01-12 MED ORDER — IBUPROFEN 200 MG PO TABS: 600.0000 mg | ORAL_TABLET | Freq: Three times a day (TID) | ORAL | Status: DC | PRN

## 2016-01-12 MED ORDER — LORAZEPAM 1 MG PO TABS
0.0000 mg | ORAL_TABLET | Freq: Four times a day (QID) | ORAL | Status: DC
  Administered 2016-01-12: 2 mg via ORAL
  Filled 2016-01-12: qty 2

## 2016-01-12 MED ORDER — GABAPENTIN 300 MG PO CAPS
300.0000 mg | ORAL_CAPSULE | Freq: Two times a day (BID) | ORAL | Status: DC
  Administered 2016-01-12: 300 mg via ORAL
  Filled 2016-01-12: qty 1

## 2016-01-12 MED ORDER — NICOTINE 21 MG/24HR TD PT24
21.0000 mg | MEDICATED_PATCH | Freq: Every day | TRANSDERMAL | Status: DC
  Administered 2016-01-15 – 2016-01-16 (×2): 21 mg via TRANSDERMAL
  Filled 2016-01-12 (×5): qty 1

## 2016-01-12 MED ORDER — HYDROCHLOROTHIAZIDE 25 MG PO TABS
25.0000 mg | ORAL_TABLET | Freq: Every day | ORAL | Status: DC
  Administered 2016-01-12: 25 mg via ORAL
  Filled 2016-01-12: qty 1

## 2016-01-12 MED ORDER — DOCUSATE SODIUM 100 MG PO CAPS
100.0000 mg | ORAL_CAPSULE | Freq: Two times a day (BID) | ORAL | Status: DC
  Administered 2016-01-12: 100 mg via ORAL
  Filled 2016-01-12: qty 1

## 2016-01-12 MED ORDER — DOCUSATE SODIUM 100 MG PO CAPS
100.0000 mg | ORAL_CAPSULE | Freq: Two times a day (BID) | ORAL | Status: DC
  Administered 2016-01-12 – 2016-01-26 (×26): 100 mg via ORAL
  Filled 2016-01-12 (×32): qty 1

## 2016-01-12 MED ORDER — FUROSEMIDE 20 MG PO TABS
20.0000 mg | ORAL_TABLET | Freq: Every day | ORAL | Status: DC
  Administered 2016-01-13 – 2016-01-26 (×13): 20 mg via ORAL
  Filled 2016-01-12 (×15): qty 1

## 2016-01-12 MED ORDER — CARISOPRODOL 350 MG PO TABS
350.0000 mg | ORAL_TABLET | Freq: Three times a day (TID) | ORAL | Status: DC
  Administered 2016-01-12 – 2016-01-18 (×19): 350 mg via ORAL
  Filled 2016-01-12 (×19): qty 1

## 2016-01-12 MED ORDER — FUROSEMIDE 40 MG PO TABS
20.0000 mg | ORAL_TABLET | Freq: Every day | ORAL | Status: DC
  Administered 2016-01-12: 20 mg via ORAL
  Filled 2016-01-12: qty 1

## 2016-01-12 MED ORDER — LORAZEPAM 1 MG PO TABS
0.0000 mg | ORAL_TABLET | Freq: Four times a day (QID) | ORAL | Status: DC
  Administered 2016-01-12: 1 mg via ORAL
  Administered 2016-01-13: 2 mg via ORAL
  Filled 2016-01-12 (×2): qty 2

## 2016-01-12 NOTE — Progress Notes (Signed)
Admission note:  Patient is a 50 yo female that is well known to [facility].  Patient has hx of depression, bipolar 1 disorder and substance abuse.  Patient has been drinking 1/2 gallon daily, along with smoking crack, taking oxys and smoking weed.  Patient has a hx of significant alcohol use.  She has medical hx of liver failure, cirrhosis, wegener's granulomatosis and hypothyroidism.  Patient has started drinking after 8 months of sobriety.  Patient states she has had several people in her family struggle with alcoholism.  Patient had a brother that she found dead in a hotel after he had committed suicide.  Patients alcohol level was well over 300.  Patient states her birthday is Friday and she doesn't want to be home "where they will be drinking and partying."  Patient denies SI/HI/AVH.  Patient came on the unit in a wheelchair due to extreme unsteadiness.  Patient presents with sad, depressed mood.  She is soft spoken and just wants "to go to bed."  Patient reoriented to room and unit.

## 2016-01-12 NOTE — ED Provider Notes (Signed)
CSN: [id number]     Arrival date & time 01/12/16  0458 History   First MD Initiated Contact with Patient 01/12/16 [phone]     Chief Complaint  Patient presents with  . Drug / Alcohol Assessment     (Consider location/radiation/quality/duration/timing/severity/associated sxs/prior Treatment) HPI   Erin [name] is a 50 y.o. female, with a history of depression, anxiety, bipolar, cirrhosis of the liver, and liver failure, presenting to the ED with request for alcohol and drug rehab. Pt states, "I took a pill last night that was white and green and it's making me freak out!" Patient does not know what she took. Pt also admits to alcohol use, but would not quantify how much she drank or how much she drinks daily. Pt denies SI or HI. Endorses visual hallucinations: "There are at least three people all around the room staring at me!" when there is no one in the room other than the provider. Pt also keeps repeating, "Drugs are bad! They will make you hurt your old people. They are bad for old people."  Past Medical History  Diagnosis Date  . Hypothyroidism   . Blood transfusion July 2012  . Depression   . Anxiety   . Bipolar 1 disorder ([place])   . Menorrhagia   . Liver failure ([place])   . Bronchitis   . Cirrhosis of liver ([place])   . Wegener's granulomatosis ([place])   . History of sarcoidosis    Past Surgical History  Procedure Laterality Date  . Tubal ligation    . Laparoscopy    . Orthopedic surgery    . Cholecystectomy    . Fracture surgery    . Hernia repair    . Vaginal hysterectomy  07/27/2011    Procedure: HYSTERECTOMY VAGINAL;  Surgeon: [name], MD;  Location: [facility];  Service: Gynecology;  Laterality: N/A;  Vaginal Hysterectomy with Right Oophorectomy  . Laparotomy  07/28/2011    Procedure: EXPLORATORY LAPAROTOMY;  Surgeon: [name], MD;  Location: [facility];  Service: Gynecology;  Laterality: N/A;   Family History  Problem Relation Age of Onset  . Diabetes Mother   .  Hypertension Mother   . Diabetes Daughter   . Hypertension Daughter    Social History  Substance Use Topics  . Smoking status: Current Some Day Smoker -- 0.25 packs/day  . Smokeless tobacco: Never Used  . Alcohol Use: Yes     Comment: a fifth of vodka and a 12 pk a day of beer    OB History    Gravida Para Term Preterm AB TAB SAB Ectopic Multiple Living   4 3 3  0 1     3     Review of Systems  Psychiatric/Behavioral: Positive for hallucinations and agitation.       Alcohol and illicit drug use  All other systems reviewed and are negative.     Allergies  Fluoxetine hcl; Metoclopramide hcl; and Morphine and related  Home Medications   Prior to Admission medications   Medication Sig Start Date End Date Taking? Authorizing Provider  albuterol (PROVENTIL HFA;VENTOLIN HFA) 108 (90 BASE) MCG/ACT inhaler Inhale 1-2 puffs into the lungs every 6 (six) hours as needed for wheezing. 02/17/15 02/17/16 Yes Encarnacion Slates, NP  buPROPion (WELLBUTRIN SR) 150 MG 12 hr tablet Take 150 mg by mouth daily as needed (moody).    Yes Historical Provider, MD  carisoprodol (SOMA) 350 MG tablet Take 1 tablet three times daily: For muscle cramps Patient taking  differently: Take 350 mg by mouth 3 (three) times daily. Take 1 tablet three times daily: For muscle cramps 02/17/15  Yes Encarnacion Slates, NP  clonazePAM (KLONOPIN) 0.5 MG tablet Take 1 tablet (0.5 mg total) by mouth 2 (two) times daily as needed for anxiety (anxiety). Patient taking differently: Take 0.5 mg by mouth every 6 (six) hours.  02/17/15  Yes Encarnacion Slates, NP  docusate sodium (COLACE) 100 MG capsule Take 1 capsule (100 mg total) by mouth 2 (two) times daily. (This is an over the counter medicine): For constipation 02/17/15  Yes Encarnacion Slates, NP  DULoxetine (CYMBALTA) 60 MG capsule Take 1 capsule (60 mg total) by mouth 2 (two) times daily. For depression/muscle pain 02/17/15  Yes Encarnacion Slates, NP  furosemide (LASIX) 20 MG tablet Take 20 mg by  mouth daily. 12/25/15  Yes Historical Provider, MD  hydrochlorothiazide (HYDRODIURIL) 25 MG tablet Take 25 mg by mouth daily.   Yes Historical Provider, MD  loratadine (CLARITIN) 10 MG tablet Take 1 tablet (10 mg total) by mouth daily. (may purchase from over the counter at yr local pharmacy): For allergies Patient taking differently: Take 10 mg by mouth daily as needed for allergies. (may purchase from over the counter at yr local pharmacy): For allergies 02/17/15  Yes Encarnacion Slates, NP  meloxicam (MOBIC) 7.5 MG tablet Take 7.5 mg by mouth daily.   Yes Historical Provider, MD  oxymorphone (OPANA ER) 10 MG 12 hr tablet Take 10 mg by mouth every 12 (twelve) hours as needed for pain.   Yes Historical Provider, MD  polyethylene glycol powder (GLYCOLAX/MIRALAX) powder Take 17 g by mouth daily. Until daily soft stools: (Over the counter): For constipation 02/17/15  Yes Encarnacion Slates, NP  propranolol (INDERAL) 20 MG tablet Take 1 tablet (20 mg total) by mouth 2 (two) times daily. Fr anxiety 02/17/15  Yes Encarnacion Slates, NP  QUEtiapine (SEROQUEL) 400 MG tablet Take 1 tablet (400 mg total) by mouth at bedtime. For mood control Patient taking differently: Take 400 mg by mouth 2 (two) times daily. For mood control 02/17/15  Yes Encarnacion Slates, NP  QUEtiapine (SEROQUEL) 50 MG tablet Take 1 tablet (50 mg total) by mouth 3 (three) times daily. For agitation 02/17/15  Yes Encarnacion Slates, NP  thyroid (ARMOUR) 30 MG tablet Take 1 tablet (30 mg total) by mouth daily before breakfast. For low thyroid function 02/17/15  Yes Encarnacion Slates, NP  traZODone (DESYREL) 100 MG tablet Take 100 mg by mouth at bedtime. 06/02/15  Yes Historical Provider, MD   BP 159/95 mmHg  Pulse 102  Temp(Src) 98.5 F (36.9 C) (Oral)  Resp 20  SpO2 97% Physical Exam  Constitutional: She is oriented to person, place, and time. She appears well-developed and well-nourished. No distress.  HENT:  Head: Normocephalic and atraumatic.  Eyes: Conjunctivae  and EOM are normal.  Pupillary size is increased, but equal bilaterally.  Neck: Normal range of motion. Neck supple.  Cardiovascular: Normal rate, regular rhythm, normal heart sounds and intact distal pulses.   Pulmonary/Chest: Effort normal and breath sounds normal. No respiratory distress.  Abdominal: Soft. Bowel sounds are normal.  Musculoskeletal: She exhibits no edema or tenderness.  Lymphadenopathy:    She has no cervical adenopathy.  Neurological: She is alert and oriented to person, place, and time. She has normal reflexes.  Patient would not sit still long enough to comply with a full examination.  No sensory deficits. Strength  5/5 in all extremities. No gait disturbance. Coordination intact. Cranial nerves III-XII grossly intact. No facial droop.   Skin: Skin is warm and dry. She is not diaphoretic.  Psychiatric: Her mood appears anxious. Her affect is labile and inappropriate. Her speech is rapid and/or pressured and tangential. She is agitated and hyperactive. She expresses impulsivity and inappropriate judgment.  Patient is pacing the room, having tremors, and talking constantly. Pt is intermittently tearful and speaks in a loud voice, yelling at times. Speech is pressured and tangential. Patient's speech does not seem to make sense. She keeps yelling, "I took something and I need the antidote! Everything has an antidote. Like if you take crack, you have to drink milk." Patient's eyes are wide and shifty.   Nursing note and vitals reviewed.   ED Course  Procedures (including critical care time) Labs Review Labs Reviewed  COMPREHENSIVE METABOLIC PANEL - Abnormal; Notable for the following:    Sodium 146 (*)    Glucose, Bld 111 (*)    AST 101 (*)    Alkaline Phosphatase 147 (*)    Total Bilirubin 1.3 (*)    Anion gap 16 (*)    All other components within normal limits  ETHANOL - Abnormal; Notable for the following:    Alcohol, Ethyl (B) 370 (*)    All other components  within normal limits  CBC WITH DIFFERENTIAL/PLATELET - Abnormal; Notable for the following:    WBC 3.0 (*)    Platelets 86 (*)    Neutro Abs 1.4 (*)    All other components within normal limits  URINE RAPID DRUG SCREEN, HOSP PERFORMED - Abnormal; Notable for the following:    Tetrahydrocannabinol POSITIVE (*)    All other components within normal limits  ACETAMINOPHEN LEVEL - Abnormal; Notable for the following:    Acetaminophen (Tylenol), Serum <10 (*)    All other components within normal limits  SALICYLATE LEVEL  TSH    I have personally reviewed and evaluated these lab results as part of my medical decision-making.   EKG Interpretation None      MDM   Final diagnoses:  Drug use  Alcohol abuse  Agitation  Hallucination, visual    INAARA HOLLIMAN presents after taking an unknown substance last night in now has agitation, and hallucinations.  This patient's presentation could be from one of the substances that she consumed last night or the combination of alcohol with the patient's mental illness. Patient was medically cleared and placed in psych hold. Ethanol noted to be 370. Patient to be treated for this in TCU. Home medications ordered. 8:03 AM Patient was reassessed. She has calmed down enough at this point to allow a full physical exam. Those results are above, underneath the comment about that patient not able to participate in an exam fully. Patient is not tachycardic upon my exam.  Lorayne Bender, PA-C 01/12/16 [place], MD 01/12/16 1611

## 2016-01-12 NOTE — BH Assessment (Signed)
[place] Assessment Progress Note  After speaking with ED secretary, attempted to call tele assessment machine, but it is not ringing and  After several attempts, no one is available in triage at the moment to assist with the machine. Pt's ETOH level is 370.

## 2016-01-12 NOTE — ED Notes (Addendum)
Awake. Verbally responsive. A/O x4 with intermittent confusion. Resp even and unlabored. No audible adventitious breath sounds noted. ABC's intact. Pt noted with anxiety. Pt requesting to make phone call to caregiver but does not remember the number. Pt stated that she drinks daily and wants to stop. Pt denies SI/HI and visual/audible hallucinations.

## 2016-01-12 NOTE — BH Assessment (Addendum)
Assessment Note  Erin [name] is an 50 y.o. female with history of depression, anxiety, and Bipolar I Disorder. Writer met with patient face to face. Patient sts, "I want to go to [facility]", "I want to be on Dr. Baird Kay", and I please put me on the hall for mental health and not psychotic patient's".   The patient has a history of significant alcohol abuse. Today she is requesting detox and wants treatment at [facility]. She states that she has been in alcohol detox multiple times in the past but in past several months she has started drinking again after 8 months of sobriety secondary to her "life stressors". Patient reports that multiple people in her family struggle with alcoholism. She has a brother whom she found dead in a hotel after suicide. Patient's age of first use is 50 yrs old. Her last drink was late last night going into the early part of this morning.   Per previous ED notes, patient also using multiple different pills. Patient did not admit to use of pills. She did however admit to use of crack cocaine. Last use was last night.   She denies suicidal thoughts or self, she states that she is not hallucinating, she does feel significantly depressed. Her depression is evidenced by increased crying spells and fatigue.  Diagnosis: Alcohol Abuse, Anxiety Disorder NOS and Depressive Disorder NOS  Past Medical History:  Past Medical History  Diagnosis Date  . Hypothyroidism   . Blood transfusion July 2012  . Depression   . Anxiety   . Bipolar 1 disorder ([place])   . Menorrhagia   . Liver failure ([place])   . Bronchitis   . Cirrhosis of liver ([place])   . Wegener's granulomatosis ([place])   . History of sarcoidosis     Past Surgical History  Procedure Laterality Date  . Tubal ligation    . Laparoscopy    . Orthopedic surgery    . Cholecystectomy    . Fracture surgery    . Hernia repair    . Vaginal hysterectomy  07/27/2011    Procedure: HYSTERECTOMY VAGINAL;  Surgeon: [name], MD;   Location: [facility];  Service: Gynecology;  Laterality: N/A;  Vaginal Hysterectomy with Right Oophorectomy  . Laparotomy  07/28/2011    Procedure: EXPLORATORY LAPAROTOMY;  Surgeon: [name], MD;  Location: [facility];  Service: Gynecology;  Laterality: N/A;    Family History:  Family History  Problem Relation Age of Onset  . Diabetes Mother   . Hypertension Mother   . Diabetes Daughter   . Hypertension Daughter     Social History:  reports that she has been smoking.  She has never used smokeless tobacco. She reports that she drinks alcohol. She reports that she uses illicit drugs (Cocaine and Marijuana).  Additional Social History:  Alcohol / Drug Use Pain Medications: SEE MAR Prescriptions: SEE MAR Over the Counter: SEE MAR History of alcohol / drug use?: Yes Substance #1 Name of Substance 1: Alcohol  1 - Age of First Use: 50 yrs old  1 - Amount (size/oz): Alcohol binges; 1/2 gallon of Aris 1 - Frequency: daily  1 - Duration: daily since the age of 64 1 - Last Use / Amount: 01/12/2016 Substance #2 Name of Substance 2: Cocaine  2 - Age of First Use: 50 yrs old  2 - Amount (size/oz): varies  2 - Frequency: daily  2 - Duration: on-going  2 - Last Use / Amount: 01/12/2016  CIWA: CIWA-Ar  BP: 159/95 mmHg Pulse Rate: 102 COWS:    Allergies:  Allergies  Allergen Reactions  . Fluoxetine Hcl Other (See Comments)     hyperactivity  . Metoclopramide Hcl Other (See Comments)    depression  . Morphine And Related Other (See Comments)    High doses cause hallucinations    Home Medications:  (Not in a hospital admission)  OB/GYN Status:  No LMP recorded. Patient has had a hysterectomy.  General Assessment Data [facility] of Assessment: [facility] TTS Assessment: In system Is this a Tele or Face-to-Face Assessment?: Face-to-Face Is this an Initial Assessment or a Re-assessment for this encounter?: Initial Assessment Marital status: Single Maiden name:  (n/a) Is patient pregnant?:  No Pregnancy Status: No Living Arrangements: Alone Can pt return to current living arrangement?: Yes Admission Status: Voluntary Is patient capable of signing voluntary admission?: Yes Referral Source: Self/Family/Friend Insurance type:  (MCR/MCD)     Crisis Care Plan Living Arrangements: Alone Legal Guardian:  (Patient denies but does have a caregiver that cleans her hom) Name of Psychiatrist:  (No psychiatrist ) Name of Therapist:  (No therapist)  Education Status Is patient currently in school?: No Current Grade:  (n/a) Highest grade of school patient has completed:  (n/a) Name of school:  (n/a) Contact person:  (n/a)  Risk to self with the past 6 months Suicidal Ideation: No Has patient been a risk to self within the past 6 months prior to admission? : No Suicidal Intent: No Has patient had any suicidal intent within the past 6 months prior to admission? : No Is patient at risk for suicide?: No Suicidal Plan?: No Has patient had any suicidal plan within the past 6 months prior to admission? : No Access to Means: No What has been your use of drugs/alcohol within the last 12 months?:  (polysubstance use ) Previous Attempts/Gestures: No Other Self Harm Risks:  (patient denies ) Triggers for Past Attempts:  (no previous attempts/gestures) Intentional Self Injurious Behavior: None Family Suicide History: No Recent stressful life event(s): Other (Comment) ("I want to stop drinking"; "I am tired of it") Persecutory voices/beliefs?: No Depression: Yes Depression Symptoms: Feeling angry/irritable, Feeling worthless/self pity, Guilt, Loss of interest in usual pleasures, Isolating, Fatigue, Tearfulness, Insomnia, Despondent Substance abuse history and/or treatment for substance abuse?: No Suicide prevention information given to non-admitted patients: Not applicable  Risk to Others within the past 6 months Homicidal Ideation: No Does patient have any lifetime risk of violence  toward others beyond the six months prior to admission? : No Thoughts of Harm to Others: No Current Homicidal Intent: No Current Homicidal Plan: No Access to Homicidal Means: No Identified Victim:  (n/a) History of harm to others?: No Assessment of Violence: None Noted Violent Behavior Description:  (patient is calm and cooperative ) Does patient have access to weapons?: No Criminal Charges Pending?: No Does patient have a court date: No Is patient on probation?: No  Psychosis Hallucinations: None noted Delusions: None noted  Mental Status Report Appearance/Hygiene: Disheveled Eye Contact: Good Motor Activity: Freedom of movement Speech: Logical/coherent Level of Consciousness: Alert Mood: Depressed Affect: Appropriate to circumstance Anxiety Level: None Thought Processes: Coherent, Relevant Judgement: Impaired Orientation: Person, Place, Time, Situation Obsessive Compulsive Thoughts/Behaviors: None  Cognitive Functioning Concentration: Decreased Memory: Recent Intact, Remote Intact IQ: Average Insight: Poor Impulse Control: Poor Appetite: Fair Weight Loss:  ("I don't know.Marland KitchenMarland KitchenI don't have a scale") Weight Gain:  (unk) Sleep: Decreased Total Hours of Sleep:  ("I don't know") Vegetative Symptoms: None  ADLScreening [facility] Assessment Services) Patient's cognitive ability adequate to safely complete daily activities?: Yes Patient able to express need for assistance with ADLs?: Yes Independently performs ADLs?: Yes (appropriate for developmental age)  Prior Inpatient Therapy Prior Inpatient Therapy: Yes Prior Therapy Dates:  (yes; [place]) Prior Therapy Facilty/Provider(s):  (Multiple) Reason for Treatment:  (substance abuse)  Prior Outpatient Therapy Prior Outpatient Therapy: No Prior Therapy Dates:  (n/a) Prior Therapy Facilty/Provider(s):  (n/a) Reason for Treatment:  (n/a) Does patient have an ACCT team?: No Does patient have Intensive In-House Services?  :  No Does patient have [facility] services? : No Does patient have P4CC services?: No  ADL Screening (condition at time of admission) Patient's cognitive ability adequate to safely complete daily activities?: Yes Is the patient deaf or have difficulty hearing?: No Does the patient have difficulty seeing, even when wearing glasses/contacts?: No Does the patient have difficulty concentrating, remembering, or making decisions?: No Patient able to express need for assistance with ADLs?: Yes Does the patient have difficulty dressing or bathing?: No Independently performs ADLs?: Yes (appropriate for developmental age) Does the patient have difficulty walking or climbing stairs?: No Weakness of Legs: None Weakness of Arms/Hands: None  Home Assistive Devices/Equipment Home Assistive Devices/Equipment: None    Abuse/Neglect Assessment (Assessment to be complete while patient is alone) Physical Abuse: Denies Verbal Abuse: Denies Sexual Abuse: Denies Exploitation of patient/patient's resources: Denies Self-Neglect: Denies Values / Beliefs Cultural Requests During Hospitalization: None Spiritual Requests During Hospitalization: None   Advance Directives (For Healthcare) Does patient have an advance directive?: No Would patient like information on creating an advanced directive?: No - patient declined information Nutrition Screen- [facility] Adult/[facility] Patient's home diet: Regular  Additional Information 1:1 In Past 12 Months?: No CIRT Risk: No Elopement Risk: No Does patient have medical clearance?: Yes     Disposition:  Disposition Initial Assessment Completed for this Encounter: Yes Disposition of Patient: Inpatient treatment program (Per Dr. Aura Dials, NP patient meets INPT treatmen) Type of inpatient treatment program: Adult  On Site Evaluation by:   Reviewed with Physician:    Waldon Merl [facility] 01/12/2016 10:41 AM

## 2016-01-12 NOTE — ED Notes (Signed)
Resting quietly with eye closed. Easily arousable. Verbally responsive. Pt eaten 40% of lunch. Resp even and unlabored. ABC's intact. No behavior problems noted. NAD noted. Pt encourage to awaken and made aware of [facility] bed. Verbalized understanding. Pt reported feeling tired and informed pt will recheck in an hour and plans to transport at that time. Pt verbalized understanding. Pt given water to drink.

## 2016-01-12 NOTE — ED Notes (Signed)
Resting quietly with eye closed. Easily arousable. Verbally responsive. Resp even and unlabored. ABC's intact. No behavior problems noted. NAD noted.  

## 2016-01-12 NOTE — BHH Group Notes (Signed)
Pt did not attend wrap up group.  Victorino Sparrow, MHT

## 2016-01-12 NOTE — ED Notes (Signed)
Called [facility] to transport pt to [facility] and ETA will be [id number].

## 2016-01-12 NOTE — Progress Notes (Signed)
D:Patient in the hallway on approach.  Patient states,"I just got here today and I am having withdrawal symptoms."  Patient CIWA was 15 tonight.  Patient states she is trying to get back to taking all of her medications.  Patient did state she was unsteady on her feet and has been using a wheelchair at times. Patient denies SI/HI and denies AVH. A: Staff to monitor Q 15 mins for safety.  Encouragement and support offered.  Scheduled medications administered per orders.  Zofran administered prn for nausea.  R: Patient remains safe on the unit.  Patient did not attend group tonight.  Patient visible on unit for medications.  Patient taking administered medications.

## 2016-01-12 NOTE — ED Notes (Signed)
Pt brought in by police voluntarily  Pt states she wants to stop drinking tonight  Pt is intoxicated upon arrival  Pt states she drinks heavily everyday  Denies SI/HI at this time

## 2016-01-12 NOTE — Tx Team (Signed)
Initial Interdisciplinary Treatment Plan   PATIENT STRESSORS: Financial difficulties Substance abuse   PATIENT STRENGTHS: Capable of independent living Supportive family/friends   PROBLEM LIST: Problem List/Patient Goals Date to be addressed Date deferred Reason deferred Estimated date of resolution  Alcohol abuse 12/27/2015     Polysubstance abuse 12/27/2015     Depression 12/27/2015                                          DISCHARGE CRITERIA:  Adequate post-discharge living arrangements Withdrawal symptoms are absent or subacute and managed without 24-hour nursing intervention  PRELIMINARY DISCHARGE PLAN: Attend 12-step recovery group Return to previous living arrangement  PATIENT/FAMIILY INVOLVEMENT: This treatment plan has been presented to and reviewed with the patient, KORALEE BUEHNER.  The patient and family have been given the opportunity to ask questions and make suggestions.  Zipporah Plants 01/12/2016, 6:41 PM

## 2016-01-12 NOTE — ED Notes (Signed)
Bed: [id number] Expected date:  Expected time:  Means of arrival:  Comments: Triage 9

## 2016-01-12 NOTE — Progress Notes (Signed)
Patient ID: Erin [name], female   DOB: August 27, 1966, 51 y.o.   MRN: [id number] Per State regulations 482.30 this chart was reviewed for medical necessity with respect to the patient's admission/duration of stay.    Next review date: 01/16/16  Debarah Crape, BSN, RN-BC  Case Manager

## 2016-01-12 NOTE — ED Notes (Signed)
SW called and reported that pt can go to [facility] Rm 304-2 as soon as she sobers up and is steady on her feet. Pt resting quietly with eyes closed. Easily arousable . Verbally responsive. Resp even and unlabored. No audible adventitious breath sounds noted. ABC's intact. NAD noted.

## 2016-01-12 NOTE — ED Notes (Signed)
Awake. Verbally responsive. Noted restlessness. A/O x4 with intermittent confusion. Resp even and unlabored. No audible adventitious breath sounds noted. ABC's intact. Pleasant and cooperative behavior.

## 2016-01-12 NOTE — BH Assessment (Signed)
[place] Assessment Progress Note  Per Corena Pilgrim, MD, this pt requires psychiatric hospitalization at this time.  [name], RN, [facility] has assigned pt to [facility] Rm 304-2.  Pt has signed Voluntary Admission and Consent for Treatment, as well as Consent to Release Information to no one, and signed forms have been faxed to [facility].  Pt's nurse has been notified, and agrees to send original paperwork along with pt via [facility], and to call report to [phone].  Jalene Mullet, [place] Triage Specialist [phone]

## 2016-01-13 ENCOUNTER — Encounter (HOSPITAL_COMMUNITY): Payer: Self-pay | Admitting: Psychiatry

## 2016-01-13 DIAGNOSIS — F314 Bipolar disorder, current episode depressed, severe, without psychotic features: Principal | ICD-10-CM

## 2016-01-13 DIAGNOSIS — F1424 Cocaine dependence with cocaine-induced mood disorder: Secondary | ICD-10-CM

## 2016-01-13 DIAGNOSIS — F1020 Alcohol dependence, uncomplicated: Secondary | ICD-10-CM

## 2016-01-13 MED ORDER — LOPERAMIDE HCL 2 MG PO CAPS: 2.0000 mg | ORAL_CAPSULE | ORAL | Status: AC | PRN

## 2016-01-13 MED ORDER — ONDANSETRON 4 MG PO TBDP
4.0000 mg | ORAL_TABLET | Freq: Four times a day (QID) | ORAL | Status: AC | PRN
  Administered 2016-01-13: 4 mg via ORAL
  Filled 2016-01-13: qty 1

## 2016-01-13 MED ORDER — TRAZODONE HCL 50 MG PO TABS
50.0000 mg | ORAL_TABLET | Freq: Every evening | ORAL | Status: DC | PRN
  Administered 2016-01-13 – 2016-01-25 (×15): 50 mg via ORAL
  Filled 2016-01-13 (×30): qty 1

## 2016-01-13 MED ORDER — IBUPROFEN 800 MG PO TABS
800.0000 mg | ORAL_TABLET | Freq: Three times a day (TID) | ORAL | Status: DC | PRN
  Administered 2016-01-13 – 2016-01-17 (×5): 800 mg via ORAL
  Filled 2016-01-13 (×5): qty 1

## 2016-01-13 MED ORDER — LORAZEPAM 1 MG PO TABS
2.0000 mg | ORAL_TABLET | Freq: Once | ORAL | Status: AC
  Administered 2016-01-13: 2 mg via ORAL
  Filled 2016-01-13: qty 2

## 2016-01-13 MED ORDER — HYDROXYZINE HCL 25 MG PO TABS
25.0000 mg | ORAL_TABLET | Freq: Four times a day (QID) | ORAL | Status: AC | PRN
  Administered 2016-01-14: 25 mg via ORAL
  Filled 2016-01-13: qty 1

## 2016-01-13 MED ORDER — LORAZEPAM 1 MG PO TABS
1.0000 mg | ORAL_TABLET | Freq: Four times a day (QID) | ORAL | Status: DC | PRN
Start: 2016-01-13 — End: 2016-01-13

## 2016-01-13 MED ORDER — LORAZEPAM 1 MG PO TABS
1.0000 mg | ORAL_TABLET | Freq: Four times a day (QID) | ORAL | Status: AC
  Administered 2016-01-13 (×2): 1 mg via ORAL
  Filled 2016-01-13 (×3): qty 1

## 2016-01-13 MED ORDER — LORAZEPAM 1 MG PO TABS
1.0000 mg | ORAL_TABLET | Freq: Two times a day (BID) | ORAL | Status: AC
  Administered 2016-01-15 (×2): 1 mg via ORAL
  Filled 2016-01-13 (×2): qty 1

## 2016-01-13 MED ORDER — THIAMINE HCL 100 MG/ML IJ SOLN: 100.0000 mg | Freq: Once | INTRAMUSCULAR | Status: AC

## 2016-01-13 MED ORDER — LORAZEPAM 1 MG PO TABS
1.0000 mg | ORAL_TABLET | Freq: Three times a day (TID) | ORAL | Status: AC
  Administered 2016-01-14 (×3): 1 mg via ORAL
  Filled 2016-01-13 (×3): qty 1

## 2016-01-13 MED ORDER — ADULT MULTIVITAMIN W/MINERALS CH
1.0000 | ORAL_TABLET | Freq: Every day | ORAL | Status: DC
  Administered 2016-01-14 – 2016-01-26 (×13): 1 via ORAL
  Filled 2016-01-13 (×15): qty 1

## 2016-01-13 MED ORDER — LORAZEPAM 1 MG PO TABS
1.0000 mg | ORAL_TABLET | ORAL | Status: DC | PRN
Start: 2016-01-13 — End: 2016-01-18
  Administered 2016-01-13 – 2016-01-18 (×11): 1 mg via ORAL
  Filled 2016-01-13 (×11): qty 1

## 2016-01-13 MED ORDER — LORAZEPAM 1 MG PO TABS
1.0000 mg | ORAL_TABLET | Freq: Every day | ORAL | Status: AC
  Administered 2016-01-16: 1 mg via ORAL
  Filled 2016-01-13: qty 1

## 2016-01-13 MED ORDER — VITAMIN B-1 100 MG PO TABS
100.0000 mg | ORAL_TABLET | Freq: Every day | ORAL | Status: DC
  Administered 2016-01-14 – 2016-01-26 (×13): 100 mg via ORAL
  Filled 2016-01-13 (×14): qty 1

## 2016-01-13 NOTE — Progress Notes (Signed)
Patient up at nurses' station asking for tray. "I'm bed ridden they told me" however patient not ambulating in wheelchair as instructed. This was rationale for patient remaining in bed. Very unsteady on feet. Patient extremely disheveled with poor hygiene. Displays mild confusion believing it is 2016 however did remember her [birth year] is [birth year]. Does know she is at [facility]. Reports VH. BP elevated (manually). Complaining of severe headache of a 10/10. Medicated per orders, ativan prn given. Gatorade provided and encouraged. Instructed patient to ambulate only via wheelchair. Fall precautions reviewed and patient verbalizes understanding. Denies SI/HI. Will reassess BP, pain at 1 hour. Patient remains safe on level III obs and staff will continue to monitor closely. Jamie Kato

## 2016-01-13 NOTE — BHH Group Notes (Signed)
[facility] LCSW Aftercare Discharge Planning Group Note   01/13/2016 10:03 AM  Participation Quality:  DID NOT ATTEND. Pt was taken to room by RN due to weakness/severity of withdrawals.   Smart, Dorrine Montone LCSW

## 2016-01-13 NOTE — H&P (Signed)
Psychiatric Admission Assessment Adult  Patient Identification: Erin [name] MRN:  [id number] Date of Evaluation:  01/13/2016 Chief Complaint:  ALCOHOL USE DISORDER ANXIETY DISORDER NOS DEPRESSIVE DISORDER NOS Principal Diagnosis: <principal problem not specified> Diagnosis:   Patient Active Problem List   Diagnosis Date Noted  . Alcohol dependence with alcohol-induced mood disorder ([place]) [F10.24]     Priority: High  . Bipolar affective disorder, depressed, severe ([place]) [F31.4] 02/06/2015    Priority: High  . Opioid dependence ([place]) [F11.20] 05/06/2014    Priority: High  . Cocaine dependence ([place]) [F14.20] 07/17/2013    Priority: High  . HYPOTHYROIDISM [E03.9] 03/02/2009    Priority: High  . Alcohol use disorder, severe, dependence ([place]) [F10.20] 01/12/2016  . Cocaine dependence with cocaine-induced mood disorder ([place]) [F14.24]   . Opioid dependence with withdrawal ([place]) [F11.23]   . Bipolar disorder ([place]) [F31.9] 02/09/2015  . Suicidal ideations [R45.851]   . Thrombocytopenia ([employer]) [D69.6] 01/06/2013  . Sarcoidosis ([place]) [D86.9] 03/30/2010  . HYPERTENSION, BENIGN ESSENTIAL [I10] 01/11/2010  . ALCOHOLIC LIVER DISEASE [K53.9] 03/06/2008  . CIRRHOSIS, ALCOHOLIC, LIVER [J67.34] 19/37/9024  . TOBACCO DEPENDENCE [F17.200] 02/22/2007  . EXTERNAL HEMORRHOIDS [K64.4] 01/11/2005  . PORTAL HYPERTENSION [K76.6] 01/11/2005   History of Present Illness:: 50 Y/O female known to our service who states she relapsed on alcohol 2 months ago. States that she has been drinking everyday. States she got around the wrong people. It was the holidays. States "I am a popular girl," I have a lot of friends I go to bars and they provide alcohol for me." States she has never felt this sick before. She states she is also using cocaine. Has kept herself drinking and using some more cocaine and then drinking. She admits she started to have some withdrawal, "seeing things" feeling very anxious shaking. She  admits she has not been taking her medications for a while. She is having a birthday tomorrow and she knows that around her birthday she gets worst so she came for help. States she wants to go to rehab The initial assessment is as follows: Erin [name] is an 50 y.o. female with history of depression, anxiety, and Bipolar I Disorder. Writer met with patient face to face. Patient sts, "I want to go to [facility]", "I want to be on Dr. Baird Kay", and I please put me on the hall for mental health and not psychotic patient's".  The patient has a history of significant alcohol abuse. Today she is requesting detox and wants treatment at [facility]. She states that she has been in alcohol detox multiple times in the past but in past several months she has started drinking again after 8 months of sobriety secondary to her "life stressors". Patient reports that multiple people in her family struggle with alcoholism. She has a brother whom she found dead in a hotel after suicide. Patient's age of first use is 50 yrs old. Her last drink was late last night going into the early part of this morning.    Associated Signs/Symptoms: Depression Symptoms:  depressed mood, anhedonia, insomnia, fatigue, feelings of worthlessness/guilt, difficulty concentrating, anxiety, loss of energy/fatigue, disturbed sleep, (Hypo) Manic Symptoms:  Impulsivity, Irritable Mood, Labiality of Mood, Anxiety Symptoms:  Excessive Worry, Panic Symptoms, Psychotic Symptoms:  Hallucinations: Auditory PTSD Symptoms: Negative Total Time spent with patient: 45 minutes  Past Psychiatric History:   Risk to Self: Is patient at risk for suicide?: No Risk to Others:   Prior Inpatient Therapy:  [facility], [place] for ECT Prior  Outpatient Therapy:  [facility], [facility]  Alcohol Screening: 1. How often do you have a drink containing alcohol?: 4 or more times a week 2. How many drinks containing alcohol do you have on a typical day when you  are drinking?: 7, 8, or 9 3. How often do you have six or more drinks on one occasion?: Weekly Preliminary Score: 6 4. How often during the last year have you found that you were not able to stop drinking once you had started?: Weekly 5. How often during the last year have you failed to do what was normally expected from you becasue of drinking?: Weekly 6. How often during the last year have you needed a first drink in the morning to get yourself going after a heavy drinking session?: Weekly 7. How often during the last year have you had a feeling of guilt of remorse after drinking?: Weekly 8. How often during the last year have you been unable to remember what happened the night before because you had been drinking?: Weekly 9. Have you or someone else been injured as a result of your drinking?: Yes, during the last year 10. Has a relative or friend or a doctor or another health worker been concerned about your drinking or suggested you cut down?: Yes, during the last year Alcohol Use Disorder Identification Test Final Score (AUDIT): 33 Brief Intervention: Yes Substance Abuse History in the last 12 months:  Yes.   Consequences of Substance Abuse: Medical Consequences:  liver disease Blackouts:   DT's: Withdrawal Symptoms:   Diaphoresis Diarrhea Nausea Tremors visual tactile hallucinations Previous Psychotropic Medications: Yes  Psychological Evaluations: No  Past Medical History:  Past Medical History  Diagnosis Date  . Hypothyroidism   . Blood transfusion July 2012  . Depression   . Anxiety   . Bipolar 1 disorder ([place])   . Menorrhagia   . Liver failure ([place])   . Bronchitis   . Cirrhosis of liver ([place])   . Wegener's granulomatosis ([place])   . History of sarcoidosis     Past Surgical History  Procedure Laterality Date  . Tubal ligation    . Laparoscopy    . Orthopedic surgery    . Cholecystectomy    . Fracture surgery    . Hernia repair    . Vaginal hysterectomy  07/27/2011     Procedure: HYSTERECTOMY VAGINAL;  Surgeon: [name], MD;  Location: [facility];  Service: Gynecology;  Laterality: N/A;  Vaginal Hysterectomy with Right Oophorectomy  . Laparotomy  07/28/2011    Procedure: EXPLORATORY LAPAROTOMY;  Surgeon: [name], MD;  Location: [facility];  Service: Gynecology;  Laterality: N/A;   Family History:  Family History  Problem Relation Age of Onset  . Diabetes Mother   . Hypertension Mother   . Diabetes Daughter   . Hypertension Daughter    Family Psychiatric  History: Extensive family history of mental illness and alcohol and substance abuse  Social History:  History  Alcohol Use  . Yes    Comment: a fifth of vodka and a 12 pk a day of beer      History  Drug Use  . Yes  . Special: Cocaine, Marijuana    Comment: opioids, benzos    Social History   Social History  . Marital Status: Divorced    Spouse Name: N/A  . Number of Children: N/A  . Years of Education: N/A   Social History Main Topics  . Smoking status: Current Some  Day Smoker -- 0.25 packs/day  . Smokeless tobacco: Never Used  . Alcohol Use: Yes     Comment: a fifth of vodka and a 12 pk a day of beer   . Drug Use: Yes    Special: Cocaine, Marijuana     Comment: opioids, benzos  . Sexual Activity: Yes    Birth Control/ Protection: None   Other Topics Concern  . None   Social History Narrative  Divorced X 4, has 3 grown kids who she does not see. 11 th grade education on disability  Additional Social History:                         Allergies:   Allergies  Allergen Reactions  . Fluoxetine Hcl Other (See Comments)     hyperactivity  . Metoclopramide Hcl Other (See Comments)    depression  . Morphine And Related Other (See Comments)    High doses cause hallucinations   Lab Results:  Results for orders placed or performed during the hospital encounter of 01/12/16 (from the past 48 hour(s))  Comprehensive metabolic panel     Status: Abnormal   Collection  Time: 01/12/16  5:50 AM  Result Value Ref Range   Sodium 146 (H) 135 - 145 mmol/L   Potassium 3.5 3.5 - 5.1 mmol/L   Chloride 102 101 - 111 mmol/L   CO2 28 22 - 32 mmol/L   Glucose, Bld 111 (H) 65 - 99 mg/dL   BUN 7 6 - 20 mg/dL   Creatinine, Ser 0.61 0.44 - 1.00 mg/dL   Calcium 9.0 8.9 - 10.3 mg/dL   Total Protein 7.8 6.5 - 8.1 g/dL   Albumin 4.2 3.5 - 5.0 g/dL   AST 101 (H) 15 - 41 U/L   ALT 37 14 - 54 U/L   Alkaline Phosphatase 147 (H) 38 - 126 U/L   Total Bilirubin 1.3 (H) 0.3 - 1.2 mg/dL   GFR calc non Af Amer >60 >60 mL/min   GFR calc Af Amer >60 >60 mL/min    Comment: (NOTE) The eGFR has been calculated using the CKD EPI equation. This calculation has not been validated in all clinical situations. eGFR's persistently <60 mL/min signify possible Chronic Kidney Disease.    Anion gap 16 (H) 5 - 15  Ethanol (ETOH)     Status: Abnormal   Collection Time: 01/12/16  5:50 AM  Result Value Ref Range   Alcohol, Ethyl (B) 370 (HH) <5 mg/dL    Comment:        LOWEST DETECTABLE LIMIT FOR SERUM ALCOHOL IS 5 mg/dL FOR MEDICAL PURPOSES ONLY CRITICAL RESULT CALLED TO, READ BACK BY AND VERIFIED WITH: ADKINS,L. RN AT [phone] 01/12/16 MULLINS,T   CBC with Diff     Status: Abnormal   Collection Time: 01/12/16  5:59 AM  Result Value Ref Range   WBC 3.0 (L) 4.0 - 10.5 K/uL   RBC 4.29 3.87 - 5.11 MIL/uL   Hemoglobin 13.9 12.0 - 15.0 g/dL   HCT 41.9 36.0 - 46.0 %   MCV 97.7 78.0 - 100.0 fL   MCH 32.4 26.0 - 34.0 pg   MCHC 33.2 30.0 - 36.0 g/dL   RDW 13.6 11.5 - 15.5 %   Platelets 86 (L) 150 - 400 K/uL    Comment: SPECIMEN CHECKED FOR CLOTS REPEATED TO VERIFY PLATELET COUNT CONFIRMED BY SMEAR    Neutrophils Relative % 45 %   Neutro Abs 1.4 (L) 1.7 -  7.7 K/uL   Lymphocytes Relative 44 %   Lymphs Abs 1.3 0.7 - 4.0 K/uL   Monocytes Relative 8 %   Monocytes Absolute 0.2 0.1 - 1.0 K/uL   Eosinophils Relative 2 %   Eosinophils Absolute 0.1 0.0 - 0.7 K/uL   Basophils Relative 2 %    Basophils Absolute 0.1 0.0 - 0.1 K/uL  Salicylate level     Status: None   Collection Time: 01/12/16  5:59 AM  Result Value Ref Range   Salicylate Lvl <4.6 2.8 - 30.0 mg/dL  Acetaminophen level     Status: Abnormal   Collection Time: 01/12/16  5:59 AM  Result Value Ref Range   Acetaminophen (Tylenol), Serum <10 (L) 10 - 30 ug/mL    Comment:        THERAPEUTIC CONCENTRATIONS VARY SIGNIFICANTLY. A RANGE OF 10-30 ug/mL MAY BE AN EFFECTIVE CONCENTRATION FOR MANY PATIENTS. HOWEVER, SOME ARE BEST TREATED AT CONCENTRATIONS OUTSIDE THIS RANGE. ACETAMINOPHEN CONCENTRATIONS >150 ug/mL AT 4 HOURS AFTER INGESTION AND >50 ug/mL AT 12 HOURS AFTER INGESTION ARE OFTEN ASSOCIATED WITH TOXIC REACTIONS.   TSH     Status: None   Collection Time: 01/12/16  8:11 AM  Result Value Ref Range   TSH 4.397 0.350 - 4.500 uIU/mL  Urine rapid drug screen (hosp performed)not at [facility]     Status: Abnormal   Collection Time: 01/12/16  8:41 AM  Result Value Ref Range   Opiates NONE DETECTED NONE DETECTED   Cocaine NONE DETECTED NONE DETECTED   Benzodiazepines NONE DETECTED NONE DETECTED   Amphetamines NONE DETECTED NONE DETECTED   Tetrahydrocannabinol POSITIVE (A) NONE DETECTED   Barbiturates NONE DETECTED NONE DETECTED    Comment:        DRUG SCREEN FOR MEDICAL PURPOSES ONLY.  IF CONFIRMATION IS NEEDED FOR ANY PURPOSE, NOTIFY LAB WITHIN 5 DAYS.        LOWEST DETECTABLE LIMITS FOR URINE DRUG SCREEN Drug Class       Cutoff (ng/mL) Amphetamine      1000 Barbiturate      200 Benzodiazepine   962 Tricyclics       952 Opiates          300 Cocaine          300 THC              50     Metabolic Disorder Labs:  Lab Results  Component Value Date   HGBA1C 5.4 05/04/2014   MPG 108 05/04/2014   MPG 91 07/19/2013   No results found for: PROLACTIN Lab Results  Component Value Date   CHOL * 03/22/2010    214        ATP III CLASSIFICATION:  <200     mg/dL   Desirable  200-239  mg/dL   Borderline  High  >=240    mg/dL   High          TRIG 92 03/22/2010   HDL 60 03/22/2010   CHOLHDL 3.6 03/22/2010   VLDL 18 03/22/2010   LDLCALC * 03/22/2010    136        Total Cholesterol/HDL:CHD Risk Coronary Heart Disease Risk Table                     Men   Women  1/2 Average Risk   3.4   3.3  Average Risk       5.0   4.4  2 X Average Risk   9.6  7.1  3 X Average Risk  23.4   11.0        Use the calculated Patient Ratio above and the CHD Risk Table to determine the patient's CHD Risk.        ATP III CLASSIFICATION (LDL):  <100     mg/dL   Optimal  100-129  mg/dL   Near or Above                    Optimal  130-159  mg/dL   Borderline  160-189  mg/dL   High  >190     mg/dL   Very High   LDLCALC 99 02/27/2009    Current Medications: Current Facility-Administered Medications  Medication Dose Route Frequency Provider Last Rate Last Dose  . albuterol (PROVENTIL HFA;VENTOLIN HFA) 108 (90 Base) MCG/ACT inhaler 1-2 puff  1-2 puff Inhalation Q6H PRN Delfin Gant, NP      . alum & mag hydroxide-simeth (MAALOX/MYLANTA) 200-200-20 MG/5ML suspension 30 mL  30 mL Oral PRN Delfin Gant, NP      . carisoprodol (SOMA) tablet 350 mg  350 mg Oral TID Delfin Gant, NP   350 mg at 01/13/16 0852  . docusate sodium (COLACE) capsule 100 mg  100 mg Oral BID Delfin Gant, NP   100 mg at 01/12/16 2006  . furosemide (LASIX) tablet 20 mg  20 mg Oral Daily Delfin Gant, NP   20 mg at 01/13/16 0851  . gabapentin (NEURONTIN) capsule 300 mg  300 mg Oral BID Delfin Gant, NP   300 mg at 01/13/16 0851  . hydrochlorothiazide (HYDRODIURIL) tablet 25 mg  25 mg Oral Daily Delfin Gant, NP   25 mg at 01/13/16 0851  . hydrOXYzine (ATARAX/VISTARIL) tablet 25 mg  25 mg Oral Q6H PRN Nicholaus Bloom, MD      . ibuprofen (ADVIL,MOTRIN) tablet 600 mg  600 mg Oral Q8H PRN Delfin Gant, NP   600 mg at 01/13/16 0856  . loperamide (IMODIUM) capsule 2-4 mg  2-4 mg Oral PRN Nicholaus Bloom,  MD      . loratadine (CLARITIN) tablet 10 mg  10 mg Oral Daily Delfin Gant, NP   10 mg at 01/13/16 0851  . LORazepam (ATIVAN) tablet 1 mg  1 mg Oral QID Nicholaus Bloom, MD       Followed by  . [START ON 01/14/2016] LORazepam (ATIVAN) tablet 1 mg  1 mg Oral TID Nicholaus Bloom, MD       Followed by  . [START ON 01/15/2016] LORazepam (ATIVAN) tablet 1 mg  1 mg Oral BID Nicholaus Bloom, MD       Followed by  . [START ON 01/17/2016] LORazepam (ATIVAN) tablet 1 mg  1 mg Oral Daily Nicholaus Bloom, MD      . LORazepam (ATIVAN) tablet 1 mg  1 mg Oral Q4H PRN Nicholaus Bloom, MD      . meloxicam [facility]) tablet 7.5 mg  7.5 mg Oral Daily Delfin Gant, NP   7.5 mg at 01/13/16 0852  . multivitamin with minerals tablet 1 tablet  1 tablet Oral Daily Nicholaus Bloom, MD      . nicotine (NICODERM CQ - dosed in mg/24 hours) patch 21 mg  21 mg Transdermal Daily Delfin Gant, NP   21 mg at 01/13/16 0800  . ondansetron (ZOFRAN) tablet 4 mg  4 mg Oral Q8H PRN Josephine C  Onuoha, NP   4 mg at 01/12/16 2130  . ondansetron (ZOFRAN-ODT) disintegrating tablet 4 mg  4 mg Oral Q6H PRN Nicholaus Bloom, MD      . propranolol (INDERAL) tablet 20 mg  20 mg Oral BID Delfin Gant, NP   20 mg at 01/13/16 0851  . thiamine (VITAMIN B-1) tablet 100 mg  100 mg Oral Daily Delfin Gant, NP   100 mg at 01/13/16 6962   Or  . thiamine (B-1) injection 100 mg  100 mg Intravenous Daily Delfin Gant, NP      . thiamine (B-1) injection 100 mg  100 mg Intramuscular Once Nicholaus Bloom, MD      . Derrill Memo ON 01/14/2016] thiamine (VITAMIN B-1) tablet 100 mg  100 mg Oral Daily Nicholaus Bloom, MD      . thyroid (ARMOUR) tablet 30 mg  30 mg Oral QAC breakfast Delfin Gant, NP   30 mg at 01/13/16 9528   PTA Medications: Prescriptions prior to admission  Medication Sig Dispense Refill Last Dose  . albuterol (PROVENTIL HFA;VENTOLIN HFA) 108 (90 BASE) MCG/ACT inhaler Inhale 1-2 puffs into the lungs every 6 (six) hours as  needed for wheezing.   Past Month at Unknown time  . buPROPion (WELLBUTRIN SR) 150 MG 12 hr tablet Take 150 mg by mouth daily as needed (moody).    Past Month at Unknown time  . carisoprodol (SOMA) 350 MG tablet Take 1 tablet three times daily: For muscle cramps (Patient taking differently: Take 350 mg by mouth 3 (three) times daily. Take 1 tablet three times daily: For muscle cramps) 30 tablet 0 01/12/2016 at Unknown time  . clonazePAM (KLONOPIN) 0.5 MG tablet Take 1 tablet (0.5 mg total) by mouth 2 (two) times daily as needed for anxiety (anxiety). (Patient taking differently: Take 0.5 mg by mouth every 6 (six) hours. ) 10 tablet 0 unknown  . docusate sodium (COLACE) 100 MG capsule Take 1 capsule (100 mg total) by mouth 2 (two) times daily. (This is an over the counter medicine): For constipation 10 capsule 0 Past Month at Unknown time  . DULoxetine (CYMBALTA) 60 MG capsule Take 1 capsule (60 mg total) by mouth 2 (two) times daily. For depression/muscle pain 60 capsule 0 Past Month at Unknown time  . furosemide (LASIX) 20 MG tablet Take 20 mg by mouth daily.  2 Past Month at Unknown time  . hydrochlorothiazide (HYDRODIURIL) 25 MG tablet Take 25 mg by mouth daily.   Past Month at Unknown time  . loratadine (CLARITIN) 10 MG tablet Take 1 tablet (10 mg total) by mouth daily. (may purchase from over the counter at yr local pharmacy): For allergies (Patient taking differently: Take 10 mg by mouth daily as needed for allergies. (may purchase from over the counter at yr local pharmacy): For allergies)   unknown  . meloxicam (MOBIC) 7.5 MG tablet Take 7.5 mg by mouth daily.   Past Month at Unknown time  . polyethylene glycol powder (GLYCOLAX/MIRALAX) powder Take 17 g by mouth daily. Until daily soft stools: (Over the counter): For constipation 255 g 0 Past Month at Unknown time  . propranolol (INDERAL) 20 MG tablet Take 1 tablet (20 mg total) by mouth 2 (two) times daily. Fr anxiety 60 tablet 0 Past Month at  Unknown time  . QUEtiapine (SEROQUEL) 400 MG tablet Take 1 tablet (400 mg total) by mouth at bedtime. For mood control (Patient taking differently: Take 400 mg by mouth  2 (two) times daily. For mood control) 30 tablet 0 Past Month at Unknown time  . thyroid (ARMOUR) 30 MG tablet Take 1 tablet (30 mg total) by mouth daily before breakfast. For low thyroid function 30 tablet 0 Past Month at Unknown time  . traZODone (DESYREL) 100 MG tablet Take 100 mg by mouth at bedtime.   Past Month at Unknown time    Musculoskeletal: Strength & Muscle Tone: within normal limits Gait & Station: unsteady Patient leans: Right  Psychiatric Specialty Exam: Physical Exam  Review of Systems  Constitutional: Positive for malaise/fatigue.  Eyes: Negative.   Respiratory: Negative.   Cardiovascular: Negative.   Gastrointestinal: Positive for nausea and diarrhea.  Genitourinary: Negative.   Musculoskeletal: Positive for myalgias, back pain and joint pain.  Skin: Negative.   Neurological: Positive for dizziness and headaches.  Endo/Heme/Allergies: Negative.   Psychiatric/Behavioral: Positive for depression, hallucinations and substance abuse. The patient is nervous/anxious.     Blood pressure 141/105, pulse 75, temperature 98 F (36.7 C), temperature source Oral, resp. rate 16, height '5\' 7"'  (1.702 m), weight 108.863 kg (240 lb).Body mass index is 37.58 kg/(m^2).  General Appearance: Disheveled  Eye Sport and exercise psychologist::  Fair  Speech:  Clear and Coherent and at times slurred  Volume:  Decreased  Mood:  Anxious, Depressed and Dysphoric  Affect:  Labile  Thought Process:  Coherent and Goal Directed  Orientation:  Full (Time, Place, and Person)  Thought Content:  symptoms events worries concerns  Suicidal Thoughts:  No  Homicidal Thoughts:  No  Memory:  Immediate;   Fair Recent;   Fair Remote;   Fair  Judgement:  Fair  Insight:  Present  Psychomotor Activity:  Restlessness  Concentration:  Fair  Recall:  [employer] of Knowledge:Fair  Language: Fair  Akathisia:  No  Handed:  Right  AIMS (if indicated):     Assets:  Desire for Improvement Housing  ADL's:  Intact  Cognition: WNL  Sleep:  Number of Hours: 5.25     Treatment Plan Summary: Daily contact with patient to assess and evaluate symptoms and progress in treatment and Medication management Supportive approach/coping skills Alcohol cocaine dependence/withdrawal: Ativan detox protocol/work a relapse prevention plan Monitor for the development of DT's Mood instability; once detox resume her psychotropics as appropriate Will attempt to simplify her psychotropic regime Work with CBT/minfulness Explore residential treatment options Observation Level/Precautions:  15 minute checks  Laboratory:  As per the ED  Psychotherapy: Individual/group   Medications:  Ativan detox protocol/reassess her psychotropics  Consultations:    Discharge Concerns:  Need for a residential treatment program  Estimated LOS: 5-7 days  Other:     I certify that inpatient services furnished can reasonably be expected to improve the patient's condition.   Samiyah Stupka A 1/18/201710:51 AM

## 2016-01-13 NOTE — Tx Team (Signed)
Interdisciplinary Treatment Plan Update (Adult)  Date:  01/13/2016  Time Reviewed:  8:39 AM   Progress in Treatment: Attending groups: No. Participating in groups:  No. Taking medication as prescribed:  Yes. Tolerating medication:  Yes. Family/Significant othe contact made:  SPE required for this pt.  Patient understands diagnosis:  Yes. and As evidenced by:  seeking treatment for alcohol abuse/opiate abuse, depression, SI, and for medication stabillization Discussing patient identified problems/goals with staff:  Yes. Medical problems stabilized or resolved:  Yes. Denies suicidal/homicidal ideation: Yes. Issues/concerns per patient self-inventory:  Other:  Discharge Plan or Barriers: CSW assessing for appropriate referrals.   Reason for Continuation of Hospitalization: Depression Medication stabilization Withdrawal symptoms  Comments:  Erin [name] is an 50 y.o. female with history of depression, anxiety, and Bipolar I Disorder. Writer met with patient face to face. Patient sts, "I want to go to [facility]", "I want to be on Dr. Baird Kay", and I please put me on the hall for mental health and not psychotic patient's". The patient has a history of significant alcohol abuse. Today she is requesting detox and wants treatment at [facility]. She states that she has been in alcohol detox multiple times in the past but in past several months she has started drinking again after 8 months of sobriety secondary to her "life stressors". Patient reports that multiple people in her family struggle with alcoholism. She has a brother whom she found dead in a hotel after suicide. Patient's age of first use is 50 yrs old. Her last drink was late last night going into the early part of this morning. She denies suicidal thoughts or self, she states that she is not hallucinating, she does feel significantly depressed. Her depression is evidenced by increased crying spells and fatigue. Diagnosis: Alcohol Abuse,  Anxiety Disorder NOS and Depressive Disorder NOS  Estimated length of stay:  3-5 days   New goal(s): to develop effective aftercare plan.   Additional Comments:  Patient and CSW reviewed pt's identified goals and treatment plan. Patient verbalized understanding and agreed to treatment plan. CSW reviewed [facility] "Discharge Process and Patient Involvement" Form. Pt verbalized understanding of information provided and signed form.    Review of initial/current patient goals per problem list:  1. Goal(s): Patient will participate in aftercare plan  Met: No.   Target date: at discharge  As evidenced by: Patient will participate within aftercare plan AEB aftercare provider and housing plan at discharge being identified.  1/18: CSW assessing for appropriate referrals.   2. Goal (s): Patient will exhibit decreased depressive symptoms and suicidal ideations.  Met: No.    Target date: at discharge  As evidenced by: Patient will utilize self rating of depression at 3 or below and demonstrate decreased signs of depression or be deemed stable for discharge by MD.  1/18: Pt rates depression as high. Passive SI at times/currently reports no SI. No AVH/HI.   3. Goal(s): Patient will demonstrate decreased signs of withdrawal due to substance abuse  Met:No.   Target date:at discharge   As evidenced by: Patient will produce a CIWA/COWS score of 0, have stable vitals signs, and no symptoms of withdrawal.  1/18: Pt reports high withdrawals with current CIWA score of 11 and stable vitals.   Attendees: Patient:   01/13/2016 8:39 AM   Family:   01/13/2016 8:39 AM   Physician:  Dr. Carlton Adam, MD 01/13/2016 8:39 AM   Nursing:   Ashok Pall RN 01/13/2016 8:39 AM   Clinical  Social Worker: [place], LCSW 01/13/2016 8:39 AM   Clinical Social Worker: Erasmo Downer Drinkard LCSWA; Peri Maris LCSWA 01/13/2016 8:39 AM   Other:  Gerline Legacy Nurse Case Manager 01/13/2016 8:39 AM   Other:  Agustina Caroli NP 01/13/2016 8:39 AM   Other:   01/13/2016 8:39 AM   Other:  01/13/2016 8:39 AM   Other:  01/13/2016 8:39 AM   Other:  01/13/2016 8:39 AM    01/13/2016 8:39 AM    01/13/2016 8:39 AM    01/13/2016 8:39 AM    01/13/2016 8:39 AM    Scribe for Treatment Team:   Maxie Better, LCSW 01/13/2016 8:39 AM

## 2016-01-13 NOTE — Progress Notes (Signed)
Patient continues to show mild confusion at times. Continues to report hallucinations when eyes are closed. Reaching for staff at times. Manual BP rechecked and is 135/70. Dr. Sabra Heck made aware. Bed pushed closer to call bell so that patient able to reach. Room cleared of items on floor. Assisted patient to phone to call family. Patient remains safe. Continue to monitor closely. Jamie Kato

## 2016-01-13 NOTE — BHH Group Notes (Signed)
[place] LCSW Group Therapy  01/13/2016 12:59 PM  Type of Therapy:  Group Therapy  Participation Level:  Did Not Attend-pt chose to rest in room  Modes of Intervention:  Discussion, Education, Exploration, Problem-solving, Rapport Building, Socialization and Support  Summary of Progress/Problems: Emotion Regulation: This group focused on both positive and negative emotion identification and allowed group members to process ways to identify feelings, regulate negative emotions, and find healthy ways to manage internal/external emotions. Group members were asked to reflect on a time when their reaction to an emotion led to a negative outcome and explored how alternative responses using emotion regulation would have benefited them. Group members were also asked to discuss a time when emotion regulation was utilized when a negative emotion was experienced.   Smart, Delani Kohli LCSW 01/13/2016, 12:59 PM

## 2016-01-13 NOTE — Progress Notes (Signed)
Pt did not attend NA group this evening.  

## 2016-01-13 NOTE — Progress Notes (Signed)
1:1 note - Patient informed staff on 15 mins rounds that she had become light headed while in the bathroom. States her vision became dark and she lost her balance however was able to lower onto the toilet. Did not sustain injury. Patient had been instructed regarding need to call for assist before getting up out of bed and did a teach back with this writer x 3. When asked why patient did not call for assist prior to getting up patient stated, "I don't know." Patient displays confusion, talking to self while in and out of sleep. Asking for the remote (room does not have tv, remote and patient reminded of this). Patient propped herself up and began looking for the remote but was redirected. Dr. Sabra Heck made aware of aforementioned and order received to initiate 1:1 obs. Patient informed of POC and verbalizes understanding. She remains safe at this time. Jamie Kato

## 2016-01-13 NOTE — Progress Notes (Signed)
Patient presently in dayroom with [facility]. Train of thought continues to be nonsensical at times however patient verbalizes understanding of meds. Was assisted to tub room with 2 staff, bathed and clothes changed. Remains very unsteady. Ambulating with wheelchair. Patient remains on 1:1 obs for safety, high fall risk. Patient presently safe with staff. Erin [name]

## 2016-01-13 NOTE — BHH Suicide Risk Assessment (Signed)
[facility] Admission Suicide Risk Assessment   Nursing information obtained from:    Demographic factors:    Current Mental Status:    Loss Factors:    Historical Factors:    Risk Reduction Factors:     Total Time spent with patient: 45 minutes Principal Problem: Bipolar affective disorder, depressed, severe ([place]) Diagnosis:   Patient Active Problem List   Diagnosis Date Noted  . Alcohol dependence with alcohol-induced mood disorder ([place]) [F10.24]     Priority: High  . Bipolar affective disorder, depressed, severe ([place]) [F31.4] 02/06/2015    Priority: High  . Opioid dependence ([place]) [F11.20] 05/06/2014    Priority: High  . Cocaine dependence ([place]) [F14.20] 07/17/2013    Priority: High  . HYPOTHYROIDISM [E03.9] 03/02/2009    Priority: High  . Alcohol use disorder, severe, dependence ([place]) [F10.20] 01/12/2016  . Cocaine dependence with cocaine-induced mood disorder ([place]) [F14.24]   . Opioid dependence with withdrawal ([place]) [F11.23]   . Bipolar disorder ([place]) [F31.9] 02/09/2015  . Suicidal ideations [R45.851]   . Thrombocytopenia ([place]) [D69.6] 01/06/2013  . Sarcoidosis ([place]) [D86.9] 03/30/2010  . HYPERTENSION, BENIGN ESSENTIAL [I10] 01/11/2010  . ALCOHOLIC LIVER DISEASE A999333 03/06/2008  . CIRRHOSIS, ALCOHOLIC, LIVER Q000111Q 0000000  . TOBACCO DEPENDENCE [F17.200] 02/22/2007  . EXTERNAL HEMORRHOIDS [K64.4] 01/11/2005  . PORTAL HYPERTENSION [K76.6] 01/11/2005   Subjective Data: see admission H and P  Continued Clinical Symptoms:  Alcohol Use Disorder Identification Test Final Score (AUDIT): 33 The "Alcohol Use Disorders Identification Test", Guidelines for Use in Primary Care, Second Edition.  World Pharmacologist [facility]). Score between 0-7:  no or low risk or alcohol related problems. Score between 8-15:  moderate risk of alcohol related problems. Score between 16-19:  high risk of alcohol related problems. Score 20 or above:  warrants further diagnostic evaluation for  alcohol dependence and treatment.   CLINICAL FACTORS:   Bipolar Disorder:   Depressive phase Alcohol/Substance Abuse/Dependencies   Psychiatric Specialty Exam: ROS  Blood pressure 126/89, pulse 91, temperature 98 F (36.7 C), temperature source Oral, resp. rate 16, height 5\' 7"  (1.702 m), weight 108.863 kg (240 lb).Body mass index is 37.58 kg/(m^2).   COGNITIVE FEATURES THAT CONTRIBUTE TO RISK:  Closed-mindedness, Polarized thinking and Thought constriction (tunnel vision)    SUICIDE RISK:   Moderate:  Frequent suicidal ideation with limited intensity, and duration, some specificity in terms of plans, no associated intent, good self-control, limited dysphoria/symptomatology, some risk factors present, and identifiable protective factors, including available and accessible social support.  PLAN OF CARE: see admission H and P  I certify that inpatient services furnished can reasonably be expected to improve the patient's condition.   Nicholaus Bloom, MD 01/13/2016, 3:54 PM

## 2016-01-13 NOTE — Progress Notes (Signed)
Recreation Therapy Notes  Date: 01.18.2017 Time: 9:30am Location: [address] Dayroom   Group Topic: Stress Management  Goal Area(s) Addresses:  Patient will actively participate in stress management techniques presented during session.   Behavioral Response: Did not attend.    Laureen Ochs Marieanne Marxen, LRT/CTRS        Marley Pakula L 01/13/2016 11:51 AM

## 2016-01-14 MED ORDER — GABAPENTIN 300 MG PO CAPS
300.0000 mg | ORAL_CAPSULE | Freq: Every day | ORAL | Status: DC
  Administered 2016-01-14 – 2016-01-25 (×12): 300 mg via ORAL
  Filled 2016-01-14 (×14): qty 1

## 2016-01-14 MED ORDER — QUETIAPINE FUMARATE 200 MG PO TABS
200.0000 mg | ORAL_TABLET | Freq: Every day | ORAL | Status: DC
  Administered 2016-01-14 – 2016-01-25 (×12): 200 mg via ORAL
  Filled 2016-01-14 (×14): qty 1

## 2016-01-14 MED ORDER — GABAPENTIN 300 MG PO CAPS
300.0000 mg | ORAL_CAPSULE | Freq: Three times a day (TID) | ORAL | Status: DC
  Administered 2016-01-15 – 2016-01-26 (×34): 300 mg via ORAL
  Filled 2016-01-14 (×40): qty 1

## 2016-01-14 NOTE — Progress Notes (Signed)
Psychoeducational Group Note  Date:  01/14/2016 Time:  2045  Group Topic/Focus:  wrap up group  Participation Level: Did Not Attend  Participation Quality:  Not Applicable  Affect:  Not Applicable  Cognitive:  Not Applicable  Insight:  Not Applicable  Engagement in Group: Not Applicable  Additional Comments:  Pt remained in bed asleep during group time.   Jacques Navy 01/14/2016, 9:42 PM

## 2016-01-14 NOTE — Progress Notes (Signed)
Patient ID: Erin [name], female   DOB: 11-Jan-1966, 50 y.o.   MRN: [id number]  D: Patient has a flat affect on approach today. Conversation tangential at times and difficult to follow what the patient is trying to say. Reports depression and anxiety "9" and hopelessness "8". Contracts for safety on the unit. Requesting seroquel and a MRI while she is here. Talks about her pain and recent falls. Physician notified this am of patient requests. A: Patient remains on 1:1 for safety and will give meds as ordered. R: Patient medication focused and frequently asks for ativan.

## 2016-01-14 NOTE — Progress Notes (Signed)
[facility] MD Progress Note  01/14/2016 4:52 PM Erin [name]  MRN:  [id number] Subjective:  Erin [name] continues to detox. She is endorsing that she was not able to sleep last night. She is taking Seroquel 400 mg at HS and was taking some 50's during the day to help herself with the anxiety. "I need something for my nerves like Xanax or Valium" she states she does not feel well. Admits to generalized aches and pains Principal Problem: Bipolar affective disorder, depressed, severe ([place]) Diagnosis:   Patient Active Problem List   Diagnosis Date Noted  . Alcohol dependence with alcohol-induced mood disorder ([place]) [F10.24]     Priority: High  . Bipolar affective disorder, depressed, severe ([place]) [F31.4] 02/06/2015    Priority: High  . Opioid dependence ([place]) [F11.20] 05/06/2014    Priority: High  . Cocaine dependence ([place]) [F14.20] 07/17/2013    Priority: High  . HYPOTHYROIDISM [E03.9] 03/02/2009    Priority: High  . Alcohol use disorder, severe, dependence ([place]) [F10.20] 01/12/2016  . Cocaine dependence with cocaine-induced mood disorder ([place]) [F14.24]   . Opioid dependence with withdrawal ([place]) [F11.23]   . Bipolar disorder ([place]) [F31.9] 02/09/2015  . Suicidal ideations [R45.851]   . Thrombocytopenia ([place]) [D69.6] 01/06/2013  . Sarcoidosis ([place]) [D86.9] 03/30/2010  . HYPERTENSION, BENIGN ESSENTIAL [I10] 01/11/2010  . ALCOHOLIC LIVER DISEASE A999333 03/06/2008  . CIRRHOSIS, ALCOHOLIC, LIVER Q000111Q 0000000  . TOBACCO DEPENDENCE [F17.200] 02/22/2007  . EXTERNAL HEMORRHOIDS [K64.4] 01/11/2005  . PORTAL HYPERTENSION [K76.6] 01/11/2005   Total Time spent with patient: 30 minutes  Past Psychiatric History: see admission H and P  Past Medical History:  Past Medical History  Diagnosis Date  . Hypothyroidism   . Blood transfusion July 2012  . Depression   . Anxiety   . Bipolar 1 disorder ([place])   . Menorrhagia   . Liver failure ([place])   . Bronchitis   . Cirrhosis of liver ([place])   .  Wegener's granulomatosis ([place])   . History of sarcoidosis     Past Surgical History  Procedure Laterality Date  . Tubal ligation    . Laparoscopy    . Orthopedic surgery    . Cholecystectomy    . Fracture surgery    . Hernia repair    . Vaginal hysterectomy  07/27/2011    Procedure: HYSTERECTOMY VAGINAL;  Surgeon: [name], MD;  Location: [facility];  Service: Gynecology;  Laterality: N/A;  Vaginal Hysterectomy with Right Oophorectomy  . Laparotomy  07/28/2011    Procedure: EXPLORATORY LAPAROTOMY;  Surgeon: [name], MD;  Location: [facility];  Service: Gynecology;  Laterality: N/A;   Family History:  Family History  Problem Relation Age of Onset  . Diabetes Mother   . Hypertension Mother   . Diabetes Daughter   . Hypertension Daughter    Family Psychiatric  History: see admission H and P Social History:  History  Alcohol Use  . Yes    Comment: a fifth of vodka and a 12 pk a day of beer      History  Drug Use  . Yes  . Special: Cocaine, Marijuana    Comment: opioids, benzos    Social History   Social History  . Marital Status: Divorced    Spouse Name: N/A  . Number of Children: N/A  . Years of Education: N/A   Social History Main Topics  . Smoking status: Current Some Day Smoker -- 0.25 packs/day  . Smokeless tobacco: Never Used  . Alcohol  Use: Yes     Comment: a fifth of vodka and a 12 pk a day of beer   . Drug Use: Yes    Special: Cocaine, Marijuana     Comment: opioids, benzos  . Sexual Activity: Yes    Birth Control/ Protection: None   Other Topics Concern  . None   Social History Narrative   Additional Social History:                         Sleep: Poor  Appetite:  Fair  Current Medications: Current Facility-Administered Medications  Medication Dose Route Frequency Provider Last Rate Last Dose  . albuterol (PROVENTIL HFA;VENTOLIN HFA) 108 (90 Base) MCG/ACT inhaler 1-2 puff  1-2 puff Inhalation Q6H PRN Delfin Gant, NP      .  alum & mag hydroxide-simeth (MAALOX/MYLANTA) 200-200-20 MG/5ML suspension 30 mL  30 mL Oral PRN Delfin Gant, NP      . carisoprodol (SOMA) tablet 350 mg  350 mg Oral TID Delfin Gant, NP   350 mg at 01/14/16 1630  . docusate sodium (COLACE) capsule 100 mg  100 mg Oral BID Delfin Gant, NP   100 mg at 01/14/16 1630  . furosemide (LASIX) tablet 20 mg  20 mg Oral Daily Delfin Gant, NP   20 mg at 01/14/16 0812  . gabapentin (NEURONTIN) capsule 300 mg  300 mg Oral BID Delfin Gant, NP   300 mg at 01/14/16 1631  . hydrochlorothiazide (HYDRODIURIL) tablet 25 mg  25 mg Oral Daily Delfin Gant, NP   25 mg at 01/14/16 0813  . hydrOXYzine (ATARAX/VISTARIL) tablet 25 mg  25 mg Oral Q6H PRN Nicholaus Bloom, MD   25 mg at 01/14/16 1630  . ibuprofen (ADVIL,MOTRIN) tablet 800 mg  800 mg Oral Q8H PRN Laverle Hobby, PA-C   800 mg at 01/13/16 2250  . loperamide (IMODIUM) capsule 2-4 mg  2-4 mg Oral PRN Nicholaus Bloom, MD      . loratadine (CLARITIN) tablet 10 mg  10 mg Oral Daily Delfin Gant, NP   10 mg at 01/14/16 0813  . [START ON 01/15/2016] LORazepam (ATIVAN) tablet 1 mg  1 mg Oral BID Nicholaus Bloom, MD       Followed by  . [START ON 01/16/2016] LORazepam (ATIVAN) tablet 1 mg  1 mg Oral Daily Nicholaus Bloom, MD      . LORazepam (ATIVAN) tablet 1 mg  1 mg Oral Q4H PRN Nicholaus Bloom, MD   1 mg at 01/13/16 1403  . multivitamin with minerals tablet 1 tablet  1 tablet Oral Daily Nicholaus Bloom, MD   1 tablet at 01/14/16 0813  . nicotine (NICODERM CQ - dosed in mg/24 hours) patch 21 mg  21 mg Transdermal Daily Delfin Gant, NP   21 mg at 01/13/16 0800  . ondansetron (ZOFRAN) tablet 4 mg  4 mg Oral Q8H PRN Delfin Gant, NP   4 mg at 01/12/16 2130  . ondansetron (ZOFRAN-ODT) disintegrating tablet 4 mg  4 mg Oral Q6H PRN Nicholaus Bloom, MD   4 mg at 01/13/16 2223  . propranolol (INDERAL) tablet 20 mg  20 mg Oral BID Delfin Gant, NP   20 mg at 01/14/16 1631  .  thiamine (VITAMIN B-1) tablet 100 mg  100 mg Oral Daily Nicholaus Bloom, MD   100 mg at 01/14/16 0813  .  thyroid (ARMOUR) tablet 30 mg  30 mg Oral QAC breakfast Delfin Gant, NP   30 mg at 01/14/16 0634  . traZODone (DESYREL) tablet 50 mg  50 mg Oral QHS,MR X 1 Spencer E Simon, PA-C   50 mg at 01/14/16 [id number]    Lab Results: No results found for this or any previous visit (from the past 48 hour(s)).  Physical Findings: AIMS: Facial and Oral Movements Muscles of Facial Expression: None, normal Lips and Perioral Area: None, normal Jaw: None, normal Tongue: None, normal,Extremity Movements Upper (arms, wrists, hands, fingers): None, normal Lower (legs, knees, ankles, toes): None, normal, Trunk Movements Neck, shoulders, hips: None, normal, Overall Severity Severity of abnormal movements (highest score from questions above): None, normal Incapacitation due to abnormal movements: None, normal Patient's awareness of abnormal movements (rate only patient's report): No Awareness,    CIWA:  CIWA-Ar Total: 6 COWS:     Musculoskeletal: Strength & Muscle Tone: within normal limits Gait & Station: normal Patient leans: normal  Psychiatric Specialty Exam: Review of Systems  Constitutional: Positive for malaise/fatigue.  HENT: Negative.   Eyes: Negative.   Respiratory: Negative.   Cardiovascular: Negative.   Gastrointestinal: Negative.   Genitourinary: Negative.   Musculoskeletal: Positive for myalgias and back pain.  Skin: Negative.   Neurological: Positive for weakness.  Endo/Heme/Allergies: Negative.   Psychiatric/Behavioral: Positive for depression and substance abuse. The patient is nervous/anxious and has insomnia.     Blood pressure 122/92, pulse 73, temperature 98.2 F (36.8 C), temperature source Oral, resp. rate 16, height 5\' 7"  (1.702 m), weight 108.863 kg (240 lb).Body mass index is 37.58 kg/(m^2).  General Appearance: Fairly Groomed  Engineer, water::  Fair  Speech:  Clear  and Coherent  Volume:  Decreased  Mood:  Anxious, Depressed and Dysphoric  Affect:  anxious worried  Thought Process:  Coherent and Goal Directed  Orientation:  Full (Time, Place, and Person)  Thought Content:  symptoms events worries concerns  Suicidal Thoughts:  No  Homicidal Thoughts:  No  Memory:  Immediate;   Fair Recent;   Fair Remote;   Fair  Judgement:  Fair  Insight:  Present and Shallow  Psychomotor Activity:  Restlessness  Concentration:  Fair  Recall:  [employer] of Knowledge:Fair  Language: Fair  Akathisia:  No  Handed:  Right  AIMS (if indicated):     Assets:  Desire for Improvement Housing  ADL's:  Intact  Cognition: WNL  Sleep:  Number of Hours: 4   Treatment Plan Summary: Daily contact with patient to assess and evaluate symptoms and progress in treatment and Medication management Supportive approach/coping skills Alcohol cocaine dependence; continue the Ativan detox protocol/work a relapse prevention plan Mood instability; ruminative thinking; will resume the Seroquel starting at 200 mg HS Pain; will increase the Neurontin to 300 mg QID Anxiety; will reassess the benefit from the Neurontin and Seroquel on helping her anxiety  Will use CBT/mindfulness Isiaah Cuervo A 01/14/2016, 4:52 PM
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
## 2016-01-14 NOTE — BHH Group Notes (Signed)
[place] [facility] LCSW Group Therapy  01/14/2016 12:53 PM  Type of Therapy:  Group Therapy  Participation Level:  Active  Participation Quality:  Attentive  Affect:  Appropriate  Cognitive:  Disorganized  Insight:  Lacking  Engagement in Therapy:  Limited  Modes of Intervention:  Confrontation, Discussion, Education, Exploration, Problem-solving, Rapport Building, Socialization and Support  Summary of Progress/Problems:  Finding Balance in Life. Today's group focused on defining balance in one's own words, identifying things that can knock one off balance, and exploring healthy ways to maintain balance in life. Group members were asked to provide an example of a time when they felt off balance, describe how they handled that situation,and process healthier ways to regain balance in the future. Group members were asked to share the most important tool for maintaining balance that they learned while at [facility] and how they plan to apply this method after discharge. [name] was attentive and engaged during today's processing group. Pt had difficulty remaining on topic but was redirectable. She was not able to process and talked about her upcoming birthday tomorrow. Pt struggles to make sense at times and is unsure about her current mental health providers.   Smart, Kynsli Haapala LCSW 01/14/2016, 12:53 PM

## 2016-01-14 NOTE — Progress Notes (Signed)
Patient ID: Erin [name], female   DOB: 1966/12/18, 51 y.o.   MRN: [id number] D: Client exhibits periods of confusion, from disoriented to clarity. Writer oriented client to date and place. Client was concerned about getting her night medication. A: Writer encouraged client to get out of bed and go to dayroom until medications are due. Medications reviewed and administered as scheduled. Staff will monitor 1:1 for safety. R: Client is safe on the unit, did not attend group.

## 2016-01-14 NOTE — Progress Notes (Addendum)
Patient ID: Erin [name], female   DOB: 02/22/1966, 50 y.o.   MRN: [id number] D: Patient tangential in conversation this am. Some broken thoughts and having to try to figure out what she is trying to ask. Reports that she is still unsteady when she walks and she that she has had two recent falls. Gives depression and anxiety "9" today and feelings of hopelessness "8". Contracts for safety on the unit. Needing more explanation than her baseline to comprehend.  A: Staff will continue to monitor on 1:1 for safety. Will give medications and treatments as ordered. R: Patient medication focused this am. Making multiple requests which were relayed to physician in treatment team.

## 2016-01-14 NOTE — Progress Notes (Signed)
Patient ID: Erin [name], female   DOB: [birth year], 50 y.o.   MRN: [id number]  D: Patient still continues to be unsteady on her feet at times. She has also been talking to herself and making bizarre statements. Tech reports she would yell out things in her room and talk to someone an imaginary phone. Some paranoia this afternoon also when people were checking on her in her room. A: Staff will continue to monitor on 1:1 for safety/fall risk. R: Lying in bed at present.

## 2016-01-14 NOTE — BHH Suicide Risk Assessment (Signed)
BHH INPATIENT:  Family/Significant Other Suicide Prevention Education  Suicide Prevention Education:  Patient Refusal for Family/Significant Other Suicide Prevention Education: The patient Erin [name] has refused to provide written consent for family/significant other to be provided Family/Significant Other Suicide Prevention Education during admission and/or prior to discharge.  Physician notified.  SPE completed with pt, as pt refused to consent to family contact. SPI pamphlet provided to pt and pt was encouraged to share information with support network, ask questions, and talk about any concerns relating to SPE. Pt denies access to guns/firearms and verbalized understanding of information provided. Mobile Crisis information also provided to pt.   Smart, Shaylee Stanislawski LCSW 01/14/2016, 3:40 PM

## 2016-01-15 NOTE — BHH Group Notes (Signed)
[facility] LCSW Aftercare Discharge Planning Group Note   01/15/2016 10:29 AM  Participation Quality:  Invited. DID NOT ATTEND. Pt chose to remain in bed.   Smart, Tad Fancher LCSW

## 2016-01-15 NOTE — Progress Notes (Signed)
D) Pt has been sleeping in her room much of this shift. Affect is bright with interaction. Is requesting to go over to the hospital to have an MRI done on her back. Feel that she really has hurt her back because she is not able to support herself on her legs.  A) Remains on a 1:1. Given support and encouragement.  R) Remains safe.

## 2016-01-15 NOTE — Progress Notes (Signed)
1:1 NOTE  D) Pt has been in her bed most of the morning except a brief interlude when she came to the dayroom. Denies SI and HI. Rates her depression at an 8, hopelessness at a 6 and her anxiety at a 6. States, "I don't know why I can't walk. I just can't" While sitting in the dayroom, Pt stated, "I feel like I am going to have a seizure."  A) Pt given a prn of Ativan. Given support, reassurance and praise. Provided with 1:1 support throughout the shift. Provided with a 1:1 talk.  R) Pt denies SI and HI. Pleased that she is here.

## 2016-01-15 NOTE — Progress Notes (Signed)
Pt did not attend evening AA group. Pt was asleep.

## 2016-01-15 NOTE — Progress Notes (Signed)
[facility] MD Progress Note  01/15/2016 3:14 PM Erin [name]  MRN:  [id number] Subjective:  Erin [name] has a birthday today. She states she really wants to quit this time around. States she realizes that every time she relapses coming off gets worst and worst. She endorses aches pains back pain headache. She is wanting to get further evaluated for her back pain. She is having dreams and "ilusions" that she is feeling a bottle with alcohol and then tries to drink it.  Principal Problem: Bipolar affective disorder, depressed, severe ([place]) Diagnosis:   Patient Active Problem List   Diagnosis Date Noted  . Alcohol dependence with alcohol-induced mood disorder ([place]) [F10.24]     Priority: High  . Bipolar affective disorder, depressed, severe ([place]) [F31.4] 02/06/2015    Priority: High  . Opioid dependence ([place]) [F11.20] 05/06/2014    Priority: High  . Cocaine dependence ([place]) [F14.20] 07/17/2013    Priority: High  . HYPOTHYROIDISM [E03.9] 03/02/2009    Priority: High  . Alcohol use disorder, severe, dependence ([place]) [F10.20] 01/12/2016  . Cocaine dependence with cocaine-induced mood disorder ([place]) [F14.24]   . Opioid dependence with withdrawal ([place]) [F11.23]   . Bipolar disorder ([place]) [F31.9] 02/09/2015  . Suicidal ideations [R45.851]   . Thrombocytopenia ([place]) [D69.6] 01/06/2013  . Sarcoidosis ([place]) [D86.9] 03/30/2010  . HYPERTENSION, BENIGN ESSENTIAL [I10] 01/11/2010  . ALCOHOLIC LIVER DISEASE [id number] 03/06/2008  . CIRRHOSIS, ALCOHOLIC, LIVER [id number]  . TOBACCO DEPENDENCE [F17.200] 02/22/2007  . EXTERNAL HEMORRHOIDS [K64.4] 01/11/2005  . PORTAL HYPERTENSION [K76.6] 01/11/2005   Total Time spent with patient: 30 minutes  Past Psychiatric History: see admission H and P  Past Medical History:  Past Medical History  Diagnosis Date  . Hypothyroidism   . Blood transfusion July 2012  . Depression   . Anxiety   . Bipolar 1 disorder ([place])   . Menorrhagia   . Liver failure ([place])    . Bronchitis   . Cirrhosis of liver ([place])   . Wegener's granulomatosis ([place])   . History of sarcoidosis     Past Surgical History  Procedure Laterality Date  . Tubal ligation    . Laparoscopy    . Orthopedic surgery    . Cholecystectomy    . Fracture surgery    . Hernia repair    . Vaginal hysterectomy  07/27/2011    Procedure: HYSTERECTOMY VAGINAL;  Surgeon: [name], MD;  Location: [facility];  Service: Gynecology;  Laterality: N/A;  Vaginal Hysterectomy with Right Oophorectomy  . Laparotomy  07/28/2011    Procedure: EXPLORATORY LAPAROTOMY;  Surgeon: [name], MD;  Location: [facility];  Service: Gynecology;  Laterality: N/A;   Family History:  Family History  Problem Relation Age of Onset  . Diabetes Mother   . Hypertension Mother   . Diabetes Daughter   . Hypertension Daughter    Family Psychiatric  History: see admission H and P Social History:  History  Alcohol Use  . Yes    Comment: a fifth of vodka and a 12 pk a day of beer      History  Drug Use  . Yes  . Special: Cocaine, Marijuana    Comment: opioids, benzos    Social History   Social History  . Marital Status: Divorced    Spouse Name: N/A  . Number of Children: N/A  . Years of Education: N/A   Social History Main Topics  . Smoking status: Current Some Day Smoker -- 0.25 packs/day  .  Smokeless tobacco: Never Used  . Alcohol Use: Yes     Comment: a fifth of vodka and a 12 pk a day of beer   . Drug Use: Yes    Special: Cocaine, Marijuana     Comment: opioids, benzos  . Sexual Activity: Yes    Birth Control/ Protection: None   Other Topics Concern  . None   Social History Narrative   Additional Social History:                         Sleep: Fair  Appetite:  Fair  Current Medications: Current Facility-Administered Medications  Medication Dose Route Frequency Provider Last Rate Last Dose  . albuterol (PROVENTIL HFA;VENTOLIN HFA) 108 (90 Base) MCG/ACT inhaler 1-2 puff  1-2 puff  Inhalation Q6H PRN Delfin Gant, NP      . alum & mag hydroxide-simeth (MAALOX/MYLANTA) 200-200-20 MG/5ML suspension 30 mL  30 mL Oral PRN Delfin Gant, NP      . carisoprodol (SOMA) tablet 350 mg  350 mg Oral TID Delfin Gant, NP   350 mg at 01/15/16 1216  . docusate sodium (COLACE) capsule 100 mg  100 mg Oral BID Delfin Gant, NP   100 mg at 01/15/16 0805  . furosemide (LASIX) tablet 20 mg  20 mg Oral Daily Delfin Gant, NP   20 mg at 01/15/16 0805  . gabapentin (NEURONTIN) capsule 300 mg  300 mg Oral TID Nicholaus Bloom, MD   300 mg at 01/15/16 1215  . gabapentin (NEURONTIN) capsule 300 mg  300 mg Oral QHS Nicholaus Bloom, MD   300 mg at 01/14/16 2258  . hydrochlorothiazide (HYDRODIURIL) tablet 25 mg  25 mg Oral Daily Delfin Gant, NP   25 mg at 01/15/16 0813  . hydrOXYzine (ATARAX/VISTARIL) tablet 25 mg  25 mg Oral Q6H PRN Nicholaus Bloom, MD   25 mg at 01/14/16 1630  . ibuprofen (ADVIL,MOTRIN) tablet 800 mg  800 mg Oral Q8H PRN Laverle Hobby, PA-C   800 mg at 01/13/16 2250  . loperamide (IMODIUM) capsule 2-4 mg  2-4 mg Oral PRN Nicholaus Bloom, MD      . loratadine (CLARITIN) tablet 10 mg  10 mg Oral Daily Delfin Gant, NP   10 mg at 01/15/16 0805  . LORazepam (ATIVAN) tablet 1 mg  1 mg Oral BID Nicholaus Bloom, MD   1 mg at 01/15/16 [id number]   Followed by  . [START ON 01/16/2016] LORazepam (ATIVAN) tablet 1 mg  1 mg Oral Daily Nicholaus Bloom, MD      . LORazepam (ATIVAN) tablet 1 mg  1 mg Oral Q4H PRN Nicholaus Bloom, MD   1 mg at 01/15/16 1040  . multivitamin with minerals tablet 1 tablet  1 tablet Oral Daily Nicholaus Bloom, MD   1 tablet at 01/15/16 0805  . nicotine (NICODERM CQ - dosed in mg/24 hours) patch 21 mg  21 mg Transdermal Daily Delfin Gant, NP   21 mg at 01/15/16 0807  . ondansetron (ZOFRAN) tablet 4 mg  4 mg Oral Q8H PRN Delfin Gant, NP   4 mg at 01/12/16 2130  . ondansetron (ZOFRAN-ODT) disintegrating tablet 4 mg  4 mg Oral Q6H PRN Nicholaus Bloom, MD   4 mg at 01/13/16 2223  . propranolol (INDERAL) tablet 20 mg  20 mg Oral BID Delfin Gant, NP   20  mg at 01/15/16 0805  . QUEtiapine (SEROQUEL) tablet 200 mg  200 mg Oral QHS Nicholaus Bloom, MD   200 mg at 01/14/16 2259  . thiamine (VITAMIN B-1) tablet 100 mg  100 mg Oral Daily Nicholaus Bloom, MD   100 mg at 01/15/16 0805  . thyroid (ARMOUR) tablet 30 mg  30 mg Oral QAC breakfast Delfin Gant, NP   30 mg at 01/15/16 0618  . traZODone (DESYREL) tablet 50 mg  50 mg Oral QHS,MR X 1 Spencer E Simon, PA-C   50 mg at 01/14/16 [id number]    Lab Results: No results found for this or any previous visit (from the past 48 hour(s)).  Physical Findings: AIMS: Facial and Oral Movements Muscles of Facial Expression: None, normal Lips and Perioral Area: None, normal Jaw: None, normal Tongue: None, normal,Extremity Movements Upper (arms, wrists, hands, fingers): None, normal Lower (legs, knees, ankles, toes): None, normal, Trunk Movements Neck, shoulders, hips: None, normal, Overall Severity Severity of abnormal movements (highest score from questions above): None, normal Incapacitation due to abnormal movements: None, normal Patient's awareness of abnormal movements (rate only patient's report): No Awareness, Dental Status Current problems with teeth and/or dentures?: No Does patient usually wear dentures?: No  CIWA:  CIWA-Ar Total: 3 COWS:     Musculoskeletal: Strength & Muscle Tone: within normal limits Gait & Station: unsteady gate Patient leans: side ways  Psychiatric Specialty Exam: Review of Systems  Constitutional: Positive for malaise/fatigue.  Eyes: Negative.   Respiratory: Negative.   Cardiovascular: Negative.   Gastrointestinal: Negative.   Genitourinary: Negative.   Musculoskeletal: Positive for back pain and joint pain.  Skin: Negative.   Neurological: Positive for weakness and headaches.  Endo/Heme/Allergies: Negative.   Psychiatric/Behavioral: Positive for  depression and substance abuse. The patient is nervous/anxious.     Blood pressure 99/61, pulse 65, temperature 98 F (36.7 C), temperature source Oral, resp. rate 24, height 5\' 7"  (1.702 m), weight 108.863 kg (240 lb).Body mass index is 37.58 kg/(m^2).  General Appearance: Fairly Groomed  Engineer, water::  Fair  Speech:  Clear and Coherent  Volume:  Normal  Mood:  Anxious, Depressed and Dysphoric  Affect:  Restricted  Thought Process:  Coherent and Goal Directed  Orientation:  Full (Time, Place, and Person)  Thought Content:  symptoms events worries concerns  Suicidal Thoughts:  No  Homicidal Thoughts:  No  Memory:  Immediate;   Fair Recent;   Fair Remote;   Fair  Judgement:  Fair  Insight:  Present and Shallow  Psychomotor Activity:  Restlessness  Concentration:  Fair  Recall:  [employer] of Knowledge:Fair  Language: Fair  Akathisia:  No  Handed:  Right  AIMS (if indicated):     Assets:  Desire for Improvement Housing  ADL's:  Intact  Cognition: WNL  Sleep:  Number of Hours: 6.75   Treatment Plan Summary: Daily contact with patient to assess and evaluate symptoms and progress in treatment and Medication management Supportive approach/coping skills Alcohol/cocaine dependence: Ativan detox protocol/work a relapse prevention plan Mood instability; continue to work with the Seroquel 200 mg HS ( will get a more recent EKG to follow up on the QT interval) Depression; continue to monitor for the need for an antidepressant  Pain; continue the Neurontin  Insomnia; Trazodone 50 mg HS PRN Work with CBT/mindfulness Levon Penning A 01/15/2016, 3:14 PM

## 2016-01-15 NOTE — Progress Notes (Signed)
1:1 1800 D Pt is seen lying in her bed...she says that is where she is most comfortable. She says " I can transfer to the bathroom.....but I can't walk by myself". A She is compliant with medicaitons and status is unchanged. R Safety is in place; pt remains HIGH fall risk 2' unstable mobility and 1:1 in place.

## 2016-01-15 NOTE — Progress Notes (Signed)
Pt was up at the beginning of the shift stating that her back has been hurting.  She was given Ibuprofen 600 mg at that time.  By 2030, she was lying down asleep in her room with the sitter at her bedside.  She has been asleep until a few minutes ago.  Pt was given her scheduled medications at this time.   She contracts for safety.  She denies HI/AVH at this time.  Pt was up to the bathroom unassisted, but sitter was by her side.  Support and encouragement offered.  Discharge plans are in process.  Safety maintained with 1:1 observation.

## 2016-01-15 NOTE — BHH Group Notes (Signed)
[place] LCSW Group Therapy  01/15/2016 3:36 PM  Type of Therapy:  Group Therapy  Participation Level:  Did Not Attend-pt invited. Chose to remain in bed.   Summary of Progress/Problems: Feelings around Relapse. Group members discussed the meaning of relapse and shared personal stories of relapse, how it affected them and others, and how they perceived themselves during this time. Group members were encouraged to identify triggers, warning signs and coping skills used when facing the possibility of relapse. Social supports were discussed and explored in detail. Post Acute Withdrawal Syndrome (handout provided) was introduced and examined. Pt's were encouraged to ask questions, talk about key points associated with PAWS, and process this information in terms of relapse prevention.   Smart, Amier Hoyt LCSW 01/15/2016, 3:36 PM

## 2016-01-15 NOTE — Progress Notes (Signed)
Patient ID: Erin [name], female   DOB: 1966/11/03, 50 y.o.   MRN: [id number] D: Client in bed most of this shift, upon awaking asking for medication. Client takes medication with no complications, refused Trazodone. Staff will monitor 1:67for safety. R: Client is safe on the unit, did not attend group.

## 2016-01-15 NOTE — Progress Notes (Signed)
Recreation Therapy Notes  Date: 01.20.2017 Time: 9:30am Location: [address] Group Room   Group Topic: Stress Management  Goal Area(s) Addresses:  Patient will actively participate in stress management techniques presented during session.   Behavioral Response: Appropriate, Engaged   Intervention: Stress management techniques  Activity :  Deep Breathing and Mindfulness. LRT provided instruction and demonstration on practice of Mindfulness. Technique was coupled with deep breathing.   Education:  Stress Management, Discharge Planning.   Education Outcome: Acknowledges education  Clinical Observations/Feedback: Patient actively engaged in technique introduced, expressed no concerns and demonstrated ability to practice independently post d/c.   Laureen Ochs Anniah Glick, LRT/CTRS  Janasia Coverdale L 01/15/2016 1:51 PM

## 2016-01-16 LAB — LIPID PANEL
Cholesterol: 247 mg/dL — ABNORMAL HIGH (ref 0–200)
HDL: 79 mg/dL (ref >40)
LDL CALC: 142 mg/dL — AB (ref 0–99)
TRIGLYCERIDES: 128 mg/dL (ref <150)
Total CHOL/HDL Ratio: 3.1 RATIO
VLDL: 26 mg/dL (ref 0–40)

## 2016-01-16 MED ORDER — DULOXETINE HCL 30 MG PO CPEP
30.0000 mg | ORAL_CAPSULE | Freq: Every day | ORAL | Status: DC
  Filled 2016-01-16 (×2): qty 1

## 2016-01-16 MED ORDER — DULOXETINE HCL 30 MG PO CPEP
30.0000 mg | ORAL_CAPSULE | Freq: Every day | ORAL | Status: DC
  Administered 2016-01-16 – 2016-01-18 (×3): 30 mg via ORAL
  Filled 2016-01-16 (×7): qty 1

## 2016-01-16 NOTE — Progress Notes (Signed)
[facility] MD Progress Note  01/16/2016 4:28 PM Erin [name]  MRN:  [id number] Subjective: Patient reports " I am in a lot of pain from my detox"  Objective:  Patient placed on 1:1 for safety.  Patient is awake, alert and oriented X3 , found resting in bed.  Denies suicidal or homicidal ideation. Denies auditory or visual hallucination and does not appear to be responding to internal stimuli.. Patient reports she is medication compliant without mediation side effects. States she not attending group session due to pain. States her depression 8/10. Patient states " I am in pain from the detoxing of alcohol" Patient reports taken Cymbalta in the past for mood stabilization and pain.  Reports good appetite and reports resting sometimes. Support, encouragement and reassurance was provided.     Principal Problem: Bipolar affective disorder, depressed, severe ([place]) Diagnosis:   Patient Active Problem List   Diagnosis Date Noted  . Alcohol use disorder, severe, dependence ([place]) [F10.20] 01/12/2016  . Cocaine dependence with cocaine-induced mood disorder ([place]) [F14.24]   . Opioid dependence with withdrawal ([place]) [F11.23]   . Bipolar disorder ([place]) [F31.9] 02/09/2015  . Alcohol dependence with alcohol-induced mood disorder ([place]) [F10.24]   . Bipolar affective disorder, depressed, severe ([place]) [F31.4] 02/06/2015  . Suicidal ideations [R45.851]   . Opioid dependence ([place]) [F11.20] 05/06/2014  . Cocaine dependence ([place]) [F14.20] 07/17/2013  . Thrombocytopenia ([place]) [D69.6] 01/06/2013  . Sarcoidosis ([place]) [D86.9] 03/30/2010  . HYPERTENSION, BENIGN ESSENTIAL [I10] 01/11/2010  . HYPOTHYROIDISM [E03.9] 03/02/2009  . ALCOHOLIC LIVER DISEASE [id number] 03/06/2008  . CIRRHOSIS, ALCOHOLIC, LIVER [id number]  . TOBACCO DEPENDENCE [F17.200] 02/22/2007  . EXTERNAL HEMORRHOIDS [K64.4] 01/11/2005  . PORTAL HYPERTENSION [K76.6] 01/11/2005   Total Time spent with patient: 30 minutes  Past Psychiatric  History: see admission H and P  Past Medical History:  Past Medical History  Diagnosis Date  . Hypothyroidism   . Blood transfusion July 2012  . Depression   . Anxiety   . Bipolar 1 disorder ([place])   . Menorrhagia   . Liver failure ([place])   . Bronchitis   . Cirrhosis of liver ([place])   . Wegener's granulomatosis ([place])   . History of sarcoidosis     Past Surgical History  Procedure Laterality Date  . Tubal ligation    . Laparoscopy    . Orthopedic surgery    . Cholecystectomy    . Fracture surgery    . Hernia repair    . Vaginal hysterectomy  07/27/2011    Procedure: HYSTERECTOMY VAGINAL;  Surgeon: [name], MD;  Location: [facility];  Service: Gynecology;  Laterality: N/A;  Vaginal Hysterectomy with Right Oophorectomy  . Laparotomy  07/28/2011    Procedure: EXPLORATORY LAPAROTOMY;  Surgeon: [name], MD;  Location: [facility];  Service: Gynecology;  Laterality: N/A;   Family History:  Family History  Problem Relation Age of Onset  . Diabetes Mother   . Hypertension Mother   . Diabetes Daughter   . Hypertension Daughter    Family Psychiatric  History: see admission H and P Social History:  History  Alcohol Use  . Yes    Comment: a fifth of vodka and a 12 pk a day of beer      History  Drug Use  . Yes  . Special: Cocaine, Marijuana    Comment: opioids, benzos    Social History   Social History  . Marital Status: Divorced    Spouse Name: N/A  . Number of  Children: N/A  . Years of Education: N/A   Social History Main Topics  . Smoking status: Current Some Day Smoker -- 0.25 packs/day  . Smokeless tobacco: Never Used  . Alcohol Use: Yes     Comment: a fifth of vodka and a 12 pk a day of beer   . Drug Use: Yes    Special: Cocaine, Marijuana     Comment: opioids, benzos  . Sexual Activity: Yes    Birth Control/ Protection: None   Other Topics Concern  . None   Social History Narrative   Additional Social History:                          Sleep: Fair  Appetite:  Fair  Current Medications: Current Facility-Administered Medications  Medication Dose Route Frequency Provider Last Rate Last Dose  . albuterol (PROVENTIL HFA;VENTOLIN HFA) 108 (90 Base) MCG/ACT inhaler 1-2 puff  1-2 puff Inhalation Q6H PRN Delfin Gant, NP      . alum & mag hydroxide-simeth (MAALOX/MYLANTA) 200-200-20 MG/5ML suspension 30 mL  30 mL Oral PRN Delfin Gant, NP      . carisoprodol (SOMA) tablet 350 mg  350 mg Oral TID Delfin Gant, NP   350 mg at 01/16/16 1200  . docusate sodium (COLACE) capsule 100 mg  100 mg Oral BID Delfin Gant, NP   100 mg at 01/16/16 0753  . furosemide (LASIX) tablet 20 mg  20 mg Oral Daily Delfin Gant, NP   20 mg at 01/16/16 0754  . gabapentin (NEURONTIN) capsule 300 mg  300 mg Oral TID Nicholaus Bloom, MD   300 mg at 01/16/16 1200  . gabapentin (NEURONTIN) capsule 300 mg  300 mg Oral QHS Nicholaus Bloom, MD   300 mg at 01/15/16 2333  . hydrochlorothiazide (HYDRODIURIL) tablet 25 mg  25 mg Oral Daily Delfin Gant, NP   25 mg at 01/16/16 0753  . ibuprofen (ADVIL,MOTRIN) tablet 800 mg  800 mg Oral Q8H PRN Laverle Hobby, PA-C   800 mg at 01/16/16 [id number]  . loratadine (CLARITIN) tablet 10 mg  10 mg Oral Daily Delfin Gant, NP   10 mg at 01/16/16 0754  . LORazepam (ATIVAN) tablet 1 mg  1 mg Oral Q4H PRN Nicholaus Bloom, MD   1 mg at 01/16/16 0926  . multivitamin with minerals tablet 1 tablet  1 tablet Oral Daily Nicholaus Bloom, MD   1 tablet at 01/16/16 0754  . ondansetron (ZOFRAN) tablet 4 mg  4 mg Oral Q8H PRN Delfin Gant, NP   4 mg at 01/12/16 2130  . propranolol (INDERAL) tablet 20 mg  20 mg Oral BID Delfin Gant, NP   20 mg at 01/16/16 0754  . QUEtiapine (SEROQUEL) tablet 200 mg  200 mg Oral QHS Nicholaus Bloom, MD   200 mg at 01/15/16 2334  . thiamine (VITAMIN B-1) tablet 100 mg  100 mg Oral Daily Nicholaus Bloom, MD   100 mg at 01/16/16 0754  . thyroid (ARMOUR) tablet 30 mg  30 mg  Oral QAC breakfast Delfin Gant, NP   30 mg at 01/16/16 [id number]  . traZODone (DESYREL) tablet 50 mg  50 mg Oral QHS,MR X 1 Laverle Hobby, PA-C   50 mg at 01/15/16 2334    Lab Results:  Results for orders placed or performed during the hospital encounter of 01/12/16 (from the past  48 hour(s))  Lipid panel     Status: Abnormal   Collection Time: 01/16/16  6:33 AM  Result Value Ref Range   Cholesterol 247 (H) 0 - 200 mg/dL   Triglycerides 128 <150 mg/dL   HDL 79 >40 mg/dL   Total CHOL/HDL Ratio 3.1 RATIO   VLDL 26 0 - 40 mg/dL   LDL Cholesterol 142 (H) 0 - 99 mg/dL    Comment:        Total Cholesterol/HDL:CHD Risk Coronary Heart Disease Risk Table                     Men   Women  1/2 Average Risk   3.4   3.3  Average Risk       5.0   4.4  2 X Average Risk   9.6   7.1  3 X Average Risk  23.4   11.0        Use the calculated Patient Ratio above and the CHD Risk Table to determine the patient's CHD Risk.        ATP III CLASSIFICATION (LDL):  <100     mg/dL   Optimal  100-129  mg/dL   Near or Above                    Optimal  130-159  mg/dL   Borderline  160-189  mg/dL   High  >190     mg/dL   Very High Performed at [facility]     Physical Findings: AIMS: Facial and Oral Movements Muscles of Facial Expression: None, normal Lips and Perioral Area: None, normal Jaw: None, normal Tongue: None, normal,Extremity Movements Upper (arms, wrists, hands, fingers): None, normal Lower (legs, knees, ankles, toes): None, normal, Trunk Movements Neck, shoulders, hips: None, normal, Overall Severity Severity of abnormal movements (highest score from questions above): None, normal Incapacitation due to abnormal movements: None, normal Patient's awareness of abnormal movements (rate only patient's report): No Awareness, Dental Status Current problems with teeth and/or dentures?: No Does patient usually wear dentures?: No  CIWA:  CIWA-Ar Total: 3 COWS:      Musculoskeletal: Strength & Muscle Tone: within normal limits Gait & Station: unsteady gate Patient leans: side ways  Psychiatric Specialty Exam: Review of Systems  Constitutional: Positive for malaise/fatigue.  Eyes: Negative.   Respiratory: Negative.   Cardiovascular: Negative.   Gastrointestinal: Negative.   Genitourinary: Negative.   Musculoskeletal: Positive for back pain and joint pain.  Skin: Negative.   Neurological: Positive for weakness and headaches.  Endo/Heme/Allergies: Negative.   Psychiatric/Behavioral: Positive for depression and substance abuse. The patient is nervous/anxious.     Blood pressure 105/64, pulse 65, temperature 98 F (36.7 C), temperature source Oral, resp. rate 16, height 5\' 7"  (1.702 m), weight 108.863 kg (240 lb).Body mass index is 37.58 kg/(m^2).  General Appearance: Casual and Fairly Groomed, Pleasant, clam and cooperative   Engineer, water::  Fair  Speech:  Clear and Coherent  Volume:  Normal  Mood:  Anxious, Depressed and Dysphoric  Affect:  Restricted  Thought Process:  Coherent and Goal Directed  Orientation:  Full (Time, Place, and Person)  Thought Content:  symptoms events worries concerns  Suicidal Thoughts:  No  Homicidal Thoughts:  No  Memory:  Immediate;   Fair Recent;   Fair Remote;   Fair  Judgement:  Fair  Insight:  Present and Shallow  Psychomotor Activity:  Restlessness  Concentration:  Fair  Recall:  Fair  Fund of Knowledge:Fair  Language: Fair  Akathisia:  No  Handed:  Right  AIMS (if indicated):     Assets:  Desire for Improvement Housing  ADL's:  Intact  Cognition: WNL  Sleep:  Number of Hours: 6.75     I agree with current treatment plan on 01/16/2016, Patient seen face-to-face for psychiatric evaluation follow-up, chart reviewed. Reviewed the information documented and agree with the treatment plan.  Treatment Plan Summary:  Daily contact with patient to assess and evaluate symptoms and progress in  treatment and Medication management   Supportive approach/coping skills Alcohol/cocaine dependence: Ativan detox protocol/work a relapse prevention plan Mood instability; continue to work with the Seroquel 200 mg HS -  EKG was reviewed   Start Cymbalta 30 mg Po Q daily/ for mood stabilization and pain   Depression; continue to monitor for the need for an antidepressant  Continue the Neurontin for pain ContiuneTrazodone 50 mg HS PRN for insomia Work with CBT/mindfulness  Derrill Center FNP-BC 01/16/2016, 4:28 PM Agree with NP Progress Note, as above

## 2016-01-16 NOTE — Progress Notes (Signed)
D) Pt. Has been up and in the dayroom interacting with her peers. Sitting in the wheelchair. Affect is somewhat brighter this evening.  A) Given support, reassurance and praise. R) Remains on a 1:1. For fall risk.

## 2016-01-16 NOTE — [facility] Group Notes (Signed)
[facility] Group Notes: (Clinical Social Work)   01/16/2016      Type of Therapy:  Group Therapy   Participation Level:  Did Not Attend despite MHT prompting   Selmer Dominion, LCSW 01/16/2016, 5:24 PM

## 2016-01-16 NOTE — Progress Notes (Signed)
1:1 NOTE  D) Pt resting on her bed. Mood and affect are appropriate. Pt denies SI and HI. States she did not sleep well due to the bed "this bed hurts my back". A) Given support, reassurance and praise along with encouragement. R) Remains safe on her 1:1

## 2016-01-16 NOTE — Progress Notes (Signed)
1:1 NOTE  D) Pt has been in her room stating that she is doing "ok" right now. Affect is pleasant with interaction. Verbalizing about the pain in her back and how it travels to the front. "I am not sure how I hurt myself, but it is causing me to not be able to really stand up.Pt denies SI and HI. Rates her depression at an 8, hopelessness at a 6 and her anxiety at an 8. A) Given support, reassurance and praise. Provided with a 1:1. Encouragement  Given to come to the groups. R) Remains on the 1:1 for her safety

## 2016-01-16 NOTE — Progress Notes (Signed)
1:1 note:   D: Pt is currently resting in bed with eyes closed.  Respirations are even and unlabored.  Pt does not appear to be in any distress.  Earlier tonight, pt reported "I just been withdrawing all day."  She denied SI/HI, reported visual hallucinations of "I hallucinate about drinking, stuff like that."  Pt denied auditory hallucinations.  She reported back pain of 8/10.  Pt has stayed in her room for the majority of the night and she did not attend evening group.  She has been cooperative with staff.    A: Medications administered per order.  PRN medication administered for pain and anxiety.  Met with pt and offered support and encouragement.  Actively listened to pt.  PO fluids encouraged and provided.  1:1 monitoring continues for safety.    R: Pt is compliant with medications.  She verbally contracts for safety.  Pt is safe on the unit.  1:1 staff remains with pt per order.

## 2016-01-16 NOTE — [facility] Group Notes (Addendum)
[facility] Group Notes:  (Nursing/MHT/Case Management/Adjunct)  Date:  01/16/2016  Time: 1000  Type of Therapy:  Nurse Education  Life  SKills  :  The group is focused on teaching patients how to identify their needs and then identify healthy ways needed to get them met.  Participation Level:  Minimal  Participation Quality:  Attentive  Affect:  Appropriate  Cognitive:  Appropriate  Insight:  Appropriate  Engagement in Group:  Good  Modes of Intervention:  Clarification                 Summary of Progress/Problems:  Lauralyn Primes 01/16/2016, 4:37 PM

## 2016-01-16 NOTE — Progress Notes (Signed)
D) Pt has attended the groups and interacts with her peers. Pt upset this afternoon stating that she is going to have a seizure and is going into withdrawals. Pt's vitals have been within normal limits. Pt rates her depression at an 8, hopelessness at a 6 and her anxiety at an 8. Continues to focus on her back pain. A) Given support, reassurance and praise. Encouragement provided. Therapeutic humor used with Pt. R) Pt's safety maintained.

## 2016-01-16 NOTE — Progress Notes (Signed)
Patient did not attend the evening speaker [place] meeting. Pt was sleeping during group time.

## 2016-01-16 NOTE — Progress Notes (Signed)
Pt resting in bed with eyes closed.  No distress observed.  Continue 1:1 for safety related to pt's high fall risk and unsteady gait.  Sitter at bedside.  Pt safe at this time.

## 2016-01-17 NOTE — Progress Notes (Signed)
Patient did not attend the evening speaker [employer] meeting. Pt remained sleeping during group time.   

## 2016-01-17 NOTE — Progress Notes (Signed)
[name] MD Progress Note  01/17/2016 2:21 PM Erin [name]  MRN:  [id number] Subjective: Patient reports " I am having a better day, I have been attending groups"  Objective:  Patient placed on 1:1 for safety.  Patient is awake, alert and oriented X3 , found sitting in the dayroom interacting with peers.  Denies suicidal or homicidal ideation. Denies auditory or visual hallucination and does not appear to be responding to internal stimuli.. Patient reports she is medication compliant without mediation side effects. States she has attending group session. States her depression 8/10. Patient reports a decrease in pain overall. States she feels the medications are working.   Reports good appetite and reports resting sometimes. Support, encouragement and reassurance was provided.     Principal Problem: Bipolar affective disorder, depressed, severe (Erin [name]) Diagnosis:   Patient Active Problem List   Diagnosis Date Noted  . Alcohol use disorder, severe, dependence (Erin [name]) [F10.20] 01/12/2016  . Cocaine dependence with cocaine-induced mood disorder (Erin [name]) [F14.24]   . Opioid dependence with withdrawal (Erin [name]) [F11.23]   . Bipolar disorder ([name]) [F31.9] 02/09/2015  . Alcohol dependence with alcohol-induced mood disorder (Erin [name]) [F10.24]   . Bipolar affective disorder, depressed, severe (Erin [name]) [F31.4] 02/06/2015  . Suicidal ideations [R45.851]   . Opioid dependence ([name]) [F11.20] 05/06/2014  . Cocaine dependence ([name]) [F14.20] 07/17/2013  . Thrombocytopenia (Erin [name]) [D69.6] 01/06/2013  . Sarcoidosis (Erin [name]) [D86.9] 03/30/2010  . HYPERTENSION, BENIGN ESSENTIAL [I10] 01/11/2010  . HYPOTHYROIDISM [E03.9] 03/02/2009  . ALCOHOLIC LIVER DISEASE [id number] 03/06/2008  . CIRRHOSIS, ALCOHOLIC, LIVER [id number]  . TOBACCO DEPENDENCE [F17.200] 02/22/2007  . EXTERNAL HEMORRHOIDS [K64.4] 01/11/2005  . PORTAL HYPERTENSION [K76.6] 01/11/2005   Total Time spent with patient: 30 minutes  Past Psychiatric History: see  admission H and P  Past Medical History:  Past Medical History  Diagnosis Date  . Hypothyroidism   . Blood transfusion July 2012  . Depression   . Anxiety   . Bipolar 1 disorder (Erin [name])   . Menorrhagia   . Liver failure (Erin [name])   . Bronchitis   . Cirrhosis of liver (Erin [name])   . Wegener's granulomatosis (Erin [name])   . History of sarcoidosis     Past Surgical History  Procedure Laterality Date  . Tubal ligation    . Laparoscopy    . Orthopedic surgery    . Cholecystectomy    . Fracture surgery    . Hernia repair    . Vaginal hysterectomy  07/27/2011    Procedure: HYSTERECTOMY VAGINAL;  Surgeon: [name], MD;  Location: [name];  Service: Gynecology;  Laterality: N/A;  Vaginal Hysterectomy with Right Oophorectomy  . Laparotomy  07/28/2011    Procedure: EXPLORATORY LAPAROTOMY;  Surgeon: [name], MD;  Location: [name];  Service: Gynecology;  Laterality: N/A;   Family History:  Family History  Problem Relation Age of Onset  . Diabetes Mother   . Hypertension Mother   . Diabetes Daughter   . Hypertension Daughter    Family Psychiatric  History: see admission H and P Social History:  History  Alcohol Use  . Yes    Comment: a fifth of vodka and a 12 pk a day of beer      History  Drug Use  . Yes  . Special: Cocaine, Marijuana    Comment: opioids, benzos    Social History   Social History  . Marital Status: Divorced    Spouse Name: N/A  . Number of Children: N/A  . Years of Education:  N/A   Social History Main Topics  . Smoking status: Current Some Day Smoker -- 0.25 packs/day  . Smokeless tobacco: Never Used  . Alcohol Use: Yes     Comment: a fifth of vodka and a 12 pk a day of beer   . Drug Use: Yes    Special: Cocaine, Marijuana     Comment: opioids, benzos  . Sexual Activity: Yes    Birth Control/ Protection: None   Other Topics Concern  . None   Social History Narrative   Additional Social History:                         Sleep:  Fair  Appetite:  Fair  Current Medications: Current Facility-Administered Medications  Medication Dose Route Frequency Provider Last Rate Last Dose  . albuterol (PROVENTIL HFA;VENTOLIN HFA) 108 (90 Base) MCG/ACT inhaler 1-2 puff  1-2 puff Inhalation Q6H PRN Delfin Gant, NP      . alum & mag hydroxide-simeth (MAALOX/MYLANTA) 200-200-20 MG/5ML suspension 30 mL  30 mL Oral PRN Delfin Gant, NP      . carisoprodol (SOMA) tablet 350 mg  350 mg Oral TID Delfin Gant, NP   350 mg at 01/17/16 1203  . docusate sodium (COLACE) capsule 100 mg  100 mg Oral BID Delfin Gant, NP   100 mg at 01/17/16 0842  . DULoxetine (CYMBALTA) DR capsule 30 mg  30 mg Oral Daily Derrill Center, NP   30 mg at 01/17/16 0845  . furosemide (LASIX) tablet 20 mg  20 mg Oral Daily Delfin Gant, NP   20 mg at 01/17/16 0843  . gabapentin (NEURONTIN) capsule 300 mg  300 mg Oral TID Nicholaus Bloom, MD   300 mg at 01/17/16 1203  . gabapentin (NEURONTIN) capsule 300 mg  300 mg Oral QHS Nicholaus Bloom, MD   300 mg at 01/16/16 2120  . hydrochlorothiazide (HYDRODIURIL) tablet 25 mg  25 mg Oral Daily Delfin Gant, NP   25 mg at 01/17/16 0843  . ibuprofen (ADVIL,MOTRIN) tablet 800 mg  800 mg Oral Q8H PRN Laverle Hobby, PA-C   800 mg at 01/16/16 2030  . loratadine (CLARITIN) tablet 10 mg  10 mg Oral Daily Delfin Gant, NP   10 mg at 01/17/16 0843  . LORazepam (ATIVAN) tablet 1 mg  1 mg Oral Q4H PRN Nicholaus Bloom, MD   1 mg at 01/17/16 1030  . multivitamin with minerals tablet 1 tablet  1 tablet Oral Daily Nicholaus Bloom, MD   1 tablet at 01/17/16 [phone]  . ondansetron (ZOFRAN) tablet 4 mg  4 mg Oral Q8H PRN Delfin Gant, NP   4 mg at 01/12/16 2130  . propranolol (INDERAL) tablet 20 mg  20 mg Oral BID Delfin Gant, NP   20 mg at 01/17/16 0842  . QUEtiapine (SEROQUEL) tablet 200 mg  200 mg Oral QHS Nicholaus Bloom, MD   200 mg at 01/16/16 2120  . thiamine (VITAMIN B-1) tablet 100 mg  100 mg  Oral Daily Nicholaus Bloom, MD   100 mg at 01/17/16 0843  . thyroid (ARMOUR) tablet 30 mg  30 mg Oral QAC breakfast Delfin Gant, NP   30 mg at 01/17/16 0604  . traZODone (DESYREL) tablet 50 mg  50 mg Oral QHS,MR X 1 Laverle Hobby, PA-C   50 mg at 01/16/16 2120    Lab  Results:  Results for orders placed or performed during the hospital encounter of 01/12/16 (from the past 48 hour(s))  Lipid panel     Status: Abnormal   Collection Time: 01/16/16  6:33 AM  Result Value Ref Range   Cholesterol 247 (H) 0 - 200 mg/dL   Triglycerides 128 <150 mg/dL   HDL 79 >40 mg/dL   Total CHOL/HDL Ratio 3.1 RATIO   VLDL 26 0 - 40 mg/dL   LDL Cholesterol 142 (H) 0 - 99 mg/dL    Comment:        Total Cholesterol/HDL:CHD Risk Coronary Heart Disease Risk Table                     Men   Women  1/2 Average Risk   3.4   3.3  Average Risk       5.0   4.4  2 X Average Risk   9.6   7.1  3 X Average Risk  23.4   11.0        Use the calculated Patient Ratio above and the CHD Risk Table to determine the patient's CHD Risk.        ATP III CLASSIFICATION (LDL):  <100     mg/dL   Optimal  100-129  mg/dL   Near or Above                    Optimal  130-159  mg/dL   Borderline  160-189  mg/dL   High  >190     mg/dL   Very High Performed at [facility]     Physical Findings: AIMS: Facial and Oral Movements Muscles of Facial Expression: None, normal Lips and Perioral Area: None, normal Jaw: None, normal Tongue: None, normal,Extremity Movements Upper (arms, wrists, hands, fingers): None, normal Lower (legs, knees, ankles, toes): None, normal, Trunk Movements Neck, shoulders, hips: None, normal, Overall Severity Severity of abnormal movements (highest score from questions above): None, normal Incapacitation due to abnormal movements: None, normal Patient's awareness of abnormal movements (rate only patient's report): No Awareness, Dental Status Current problems with teeth and/or dentures?:  No Does patient usually wear dentures?: No  CIWA:  CIWA-Ar Total: 4 COWS:     Musculoskeletal: Strength & Muscle Tone: within normal limits Gait & Station: unsteady gate Patient leans: side ways  Psychiatric Specialty Exam: Review of Systems  Constitutional: Positive for malaise/fatigue.  Eyes: Negative.   Respiratory: Negative.   Cardiovascular: Negative.   Gastrointestinal: Negative.   Genitourinary: Negative.   Musculoskeletal: Positive for back pain and joint pain.  Skin: Negative.   Neurological: Positive for weakness and headaches.  Endo/Heme/Allergies: Negative.   Psychiatric/Behavioral: Positive for depression and substance abuse. The patient is nervous/anxious.   All other systems reviewed and are negative.   Blood pressure 95/67, pulse 63, temperature 97.3 F (36.3 C), temperature source Oral, resp. rate 24, height 5\' 7"  (1.702 m), weight 108.863 kg (240 lb).Body mass index is 37.58 kg/(m^2).  General Appearance: Casual, Pleasant, clam and cooperative   Eye Contact::  Fair  Speech:  Clear and Coherent  Volume:  Normal  Mood:  Anxious, Depressed and Dysphoric  Affect:  Restricted  Thought Process:  Coherent and Goal Directed  Orientation:  Full (Time, Place, and Person)  Thought Content: continues to have symptoms events worries concerns  Suicidal Thoughts:  No  Homicidal Thoughts:  No  Memory:  Immediate;   Fair Recent;   Fair Remote;   Fair  Judgement:  Fair  Insight:  Present and Shallow  Psychomotor Activity:  Restlessness  Concentration:  Fair  Recall:  [place]: Fair  Akathisia:  No  Handed:  Right  AIMS (if indicated):     Assets:  Desire for Improvement Housing  ADL's:  Intact  Cognition: WNL  Sleep:  Number of Hours: 6.25     I agree with current treatment plan on 01/22//2017, Patient seen face-to-face for psychiatric evaluation follow-up, chart reviewed. Reviewed the information documented and agree with the  treatment plan.  Treatment Plan Summary:  Daily contact with patient to assess and evaluate symptoms and progress in treatment and Medication management   Supportive approach/coping skills Alcohol/cocaine dependence: Ativan detox protocol/work a relapse prevention plan Mood instability; continue to work with the Seroquel 200 mg HS -  EKG was reviewed.  Continue Cymbalta 30 mg Po Q daily/ for mood stabilization and pain   Depression; continue to monitor for the need for an antidepressant  Continue the Neurontin for pain Continue Trazodone 50 mg HS PRN for insomia Work with CBT/mindfulness  Derrill Center FNP-BC 01/17/2016, 2:21 PM Agree with NP Progress Note, as above

## 2016-01-17 NOTE — Progress Notes (Signed)
D: Pt got up this morning and went to the dayroom using her wheelchair.  She reports increased anxiety.  Pt is cooperative with staff.  She is in no acute distress.    A: Pt remains on 1:1 for high fall risk.  PRN medication administered for anxiety.    R: Pt is compliant with medication.  She is safe on the unit being monitored 1:1.  Will continue to monitor and assess.

## 2016-01-17 NOTE — BHH Group Notes (Signed)
[place] Group Notes:  (Clinical Social Work)  01/17/2016  1:15-2:30PM  Summary of Progress/Problems:   The main focus of today's process group was to   1)  discuss the importance of adding supports  2)  define health supports versus unhealthy supports  3)  identify the patient's current unhealthy supports and plan how to handle them  4)  Identify the patient's current healthy supports and plan what to add.  An emphasis was placed on using counselor, doctor, therapy groups, 12-step groups, and problem-specific support groups to expand supports.    The patient expressed full comprehension of the concepts presented, and agreed that there is a need to add more supports.  The patient stated she needs to find new playgrounds and playmates, and that the way for her to do that is by going to meetings and reaching out to people.  She encouraged other patients, one in particular who was tearful and despondent about not getting the help she needs, to keep reaching out for it and she explained about how her current housing is the result of her doing that.  Type of Therapy:  Process Group with Motivational Interviewing  Participation Level:  Active  Participation Quality:  Attentive and Sharing  Affect:  Depressed and Flat  Cognitive:  Oriented  Insight:  Engaged  Engagement in Therapy:  Engaged  Modes of Intervention:   Education, Support and Processing, Activity  Selmer Dominion, LCSW 01/17/2016

## 2016-01-17 NOTE — Progress Notes (Signed)
1:1 Note  D) Pt has been out of her room this morning, in the wheelchair and attending the groups. Affect is spontaneous. Mood, less depressed. Talks easily about her concerns and worries. Will frequently refer to her family and all the trauma she has experienced in the past. Rates her depression at an 8, hopelessness at an 8 and anxiety at an 8. Denies SI. A) Given support and reassurance. Remains on 1:1 for her safety R) Pt has remained safe.

## 2016-01-17 NOTE — BHH Group Notes (Signed)
[place] Group Notes:  (Nursing/MHT/Case Management/Adjunct)  Date:  01/17/2016  Time:  1000  Type of Therapy:  Nurse Education  /  Life SKills The group is focused on teaching patients how to develop healthy support systems.  Participation Level:  Active  Participation Quality:  Appropriate  Affect:  Appropriate  Cognitive:  Alert  Insight:  Appropriate  Engagement in Group:  Engaged  Modes of Intervention:  Education  Summary of Progress/Problems:  Lauralyn Primes 01/17/2016, 12:28 PM

## 2016-01-17 NOTE — Progress Notes (Signed)
1:1 note  D: Pt is resting in her bed with eyes closed.  Respirations are even and unlabored.  Pt does not appear to be in any distress.    A: Pt remains on 1:1 due to high fall risk and unsteady gait.    R: Pt is safe on the unit.  Pt being observed 1:1 per order.  Will continue to monitor and assess.

## 2016-01-17 NOTE — Progress Notes (Signed)
D) Pt is attending the groups and interacting with her peers. Affect and mood are are improving.  A) Pt remains on a 1:1 for her safety R) Remains on 1:1

## 2016-01-17 NOTE — Progress Notes (Signed)
See 1:1 notes on MHT flowsheet 

## 2016-01-17 NOTE — Progress Notes (Signed)
D) Pt is requesting to take a shower. Has been lying in the bed due to "back pain". Affect can brighter with interaction, but for the most part, pt states taht she is depressed and sad. Expresses that she no longer wants to drink at all. "I had a drink in my hand and I looked at it; I put it down, and I called the police to come pick me up and bring me to treatment. I don't want to die. I want to live". A) Given support, reassurance and praise. Encouraged to get out of bed and attend the groups as well.  R) Pt remains on a 1;1  For fall precautions.

## 2016-01-18 LAB — HEMOGLOBIN A1C
HEMOGLOBIN A1C: 5.4 % (ref 4.8–5.6)
MEAN PLASMA GLUCOSE: 108 mg/dL

## 2016-01-18 LAB — PROLACTIN: PROLACTIN: 23.1 ng/mL (ref 4.8–23.3)

## 2016-01-18 MED ORDER — LORAZEPAM 1 MG PO TABS
1.0000 mg | ORAL_TABLET | Freq: Four times a day (QID) | ORAL | Status: DC | PRN
  Administered 2016-01-19 – 2016-01-25 (×11): 1 mg via ORAL
  Filled 2016-01-18 (×12): qty 1

## 2016-01-18 MED ORDER — CARISOPRODOL 350 MG PO TABS
350.0000 mg | ORAL_TABLET | Freq: Four times a day (QID) | ORAL | Status: DC
  Administered 2016-01-18 – 2016-01-26 (×29): 350 mg via ORAL
  Filled 2016-01-18 (×30): qty 1

## 2016-01-18 MED ORDER — DULOXETINE HCL 20 MG PO CPEP
40.0000 mg | ORAL_CAPSULE | Freq: Every day | ORAL | Status: DC
  Administered 2016-01-19: 40 mg via ORAL
  Filled 2016-01-18 (×4): qty 2

## 2016-01-18 MED ORDER — QUETIAPINE FUMARATE 50 MG PO TABS
50.0000 mg | ORAL_TABLET | Freq: Three times a day (TID) | ORAL | Status: DC
  Administered 2016-01-19 (×3): 50 mg via ORAL
  Filled 2016-01-18 (×8): qty 1

## 2016-01-18 NOTE — Progress Notes (Signed)
1:1/DAR  Note  Patient observed lying in bed with eyes open. Patient states goal is to obtain therapy to go from using wheelchair to using a walker. Patient offered encouragement and support. Patient in no distress. 1:1 sitter continues for safety and is at bedside within arms length. Q 15 minute checks in progress and maintained. Monitoring of patient continues.

## 2016-01-18 NOTE — Progress Notes (Signed)
Patient ID: Erin [name], female   DOB: 02-17-1966, 50 y.o.   MRN: [id number]  1:1 Nursing Note-  Patient is in the bed at this time laying awake. Respirations are even and unlabored and patient is showing no signs or symptoms of distress. Q15 minute safety checks maintained. 1:1 is continued for safety as well. MHT is within reach.

## 2016-01-18 NOTE — Progress Notes (Signed)
Patient ID: Erin [name], female   DOB: [birth year], 49 y.o.   MRN: [id number]  1:1 Nursing Note-  Patient is sitting in her wheelchair in the day room at this time. She is not speaking but paying attention to the social worker who is leading the group. Patient is calm and does not appear in any distress. MHT is within reach and 1:1 is maintained. Q15 minute safety checks maintained as well.

## 2016-01-18 NOTE — Progress Notes (Signed)
Patient ID: Erin [name], female   DOB: [birth year], 50 y.o.   MRN: [id number] PER STATE REGULATIONS 482.30  THIS CHART WAS REVIEWED FOR MEDICAL NECESSITY WITH RESPECT TO THE PATIENT'S ADMISSION/ DURATION OF STAY.  NEXT REVIEW DATE: 01/20/2016  Chauncy Lean, RN, BSN CASE MANAGER

## 2016-01-18 NOTE — BHH Group Notes (Signed)
[place] LCSW Group Therapy  01/18/2016 11:54 AM  Type of Therapy:  Group Therapy  Participation Level:  Active  Participation Quality:  Attentive  Affect:  Appropriate  Cognitive:  Appropriate  Insight:  Engaged  Engagement in Therapy:  Improving  Modes of Intervention:  Confrontation, Discussion, Education, Exploration, Problem-solving, Rapport Building, Socialization and Support  Summary of Progress/Problems: Today's Topic: Overcoming Obstacles. Patients identified one short term goal and potential obstacles in reaching this goal. Patients processed barriers involved in overcoming these obstacles. Patients identified steps necessary for overcoming these obstacles and explored motivation (internal and external) for facing these difficulties head on. Erin [name] was attentive and engaged during today's processing group. Erin [name] shared that her biggest obstacle is staying away from people who drink and "being alone in my apt." Erin [name] demonstrates improving insight in recognizing the many negative effects that alcohol has had on her mentally and physically. She attempted to provide emotional support to another patient experiencing similar issues.   Smart, Bernardino Dowell LCSW 01/18/2016, 11:54 AM

## 2016-01-18 NOTE — Progress Notes (Signed)
Patient ID: Erin [name], female   DOB: 09-17-1966, 50 y.o.   MRN: [id number]  1:1 Nursing Note- Patient is laying in bed at this time with her eyes open. Patient reports she keeps her socks off because she is feeling hot at this time. Patient does not appear in any distress. Respirations are WDL for this patient. MHT is within reach and 1:1 is continued for safety. Q15 minute safety checks maintained as well.

## 2016-01-18 NOTE — Progress Notes (Signed)
Recreation Therapy Notes  Date: 01.23.2017 Time: 9:30am Location: [address] Group Room   Group Topic: Stress Management  Goal Area(s) Addresses:  Patient will actively participate in stress management techniques presented during session.   Behavioral Response: Did not attend.   Laureen Ochs Nalini Alcaraz, LRT/CTRS        Lane Hacker 01/18/2016 3:41 PM

## 2016-01-18 NOTE — Tx Team (Signed)
Interdisciplinary Treatment Plan Update (Adult)  Date:  01/18/2016  Time Reviewed:  10:59 AM   Progress in Treatment: Attending groups: No. Participating in groups:  No. Taking medication as prescribed:  Yes. Tolerating medication:  Yes. Family/Significant othe contact made: SPE completed with pt, as she refused to consent to family contact.  Patient understands diagnosis:  Yes. and As evidenced by:  seeking treatment for alcohol abuse/opiate abuse, depression, SI, and for medication stabillization Discussing patient identified problems/goals with staff:  Yes. Medical problems stabilized or resolved:  Yes. Denies suicidal/homicidal ideation: Yes. Issues/concerns per patient self-inventory:  Other:  Discharge Plan or Barriers: CSW assessing for appropriate referrals. Pt plans to return home and will follow-up at mental health or her PCP. Pt unable to participate in aftercare plan at this time due to confusion and lack of attendance at groups.   Reason for Continuation of Hospitalization: Depression Medication stabilization Withdrawal symptoms  Comments:  Erin [name] is an 50 y.o. female with history of depression, anxiety, and Bipolar I Disorder. Writer met with patient face to face. Patient sts, "I want to go to [facility]", "I want to be on Dr. Baird Kay", and I please put me on the hall for mental health and not psychotic patient's". The patient has a history of significant alcohol abuse. Today she is requesting detox and wants treatment at [facility]. She states that she has been in alcohol detox multiple times in the past but in past several months she has started drinking again after 8 months of sobriety secondary to her "life stressors". Patient reports that multiple people in her family struggle with alcoholism. She has a brother whom she found dead in a hotel after suicide. Patient's age of first use is 50 yrs old. Her last drink was late last night going into the early part of this  morning. She denies suicidal thoughts or self, she states that she is not hallucinating, she does feel significantly depressed. Her depression is evidenced by increased crying spells and fatigue. Diagnosis: Alcohol Abuse, Anxiety Disorder NOS and Depressive Disorder NOS  Estimated length of stay:  1-3 days   Additional Comments:  Patient and CSW reviewed pt's identified goals and treatment plan. Patient verbalized understanding and agreed to treatment plan. CSW reviewed [facility] "Discharge Process and Patient Involvement" Form. Pt verbalized understanding of information provided and signed form.    Review of initial/current patient goals per problem list:  1. Goal(s): Patient will participate in aftercare plan  Met: No.   Target date: at discharge  As evidenced by: Patient will participate within aftercare plan AEB aftercare provider and housing plan at discharge being identified.  1/18: CSW assessing for appropriate referrals.   1/23: Pt plans to return home and follow-up with mental health or her PCP. CSW continuing to assess.   2. Goal (s): Patient will exhibit decreased depressive symptoms and suicidal ideations.  Met: No.    Target date: at discharge  As evidenced by: Patient will utilize self rating of depression at 3 or below and demonstrate decreased signs of depression or be deemed stable for discharge by MD.  1/18: Pt rates depression as high. Passive SI at times/currently reports no SI. No AVH/HI.   1/23: Pt rates depression as 4/10 and presents with depressed mood/lethargic affect. Denies SI/HI/AVH.   3. Goal(s): Patient will demonstrate decreased signs of withdrawal due to substance abuse  Met:No. Goal progressing.   Target date:at discharge   As evidenced by: Patient will produce a CIWA/COWS score  of 0, have stable vitals signs, and no symptoms of withdrawal.  1/18: Pt reports high withdrawals with current CIWA score of 11 and stable vitals.   1/23: Pt  reports that withdrawals are lessoning with CIWA score of 4 and stable vitals. Goal progressing.   Attendees: Patient:   01/18/2016 10:59 AM   Family:   01/18/2016 10:59 AM   Physician:  Dr. Carlton Adam, MD 01/18/2016 10:59 AM   Nursing:   Francene Boyers RN 01/18/2016 10:59 AM   Clinical Social Worker: Maxie Better, LCSW 01/18/2016 10:59 AM   Clinical Social Worker: Erasmo Downer Drinkard LCSWA; Peri Maris LCSWA 01/18/2016 10:59 AM   Other:  Gerline Legacy Nurse Case Manager 01/18/2016 10:59 AM   Other:  Agustina Caroli NP 01/18/2016 10:59 AM   Other:   01/18/2016 10:59 AM   Other:  01/18/2016 10:59 AM   Other:  01/18/2016 10:59 AM   Other:  01/18/2016 10:59 AM    01/18/2016 10:59 AM    01/18/2016 10:59 AM    01/18/2016 10:59 AM    01/18/2016 10:59 AM    Scribe for Treatment Team:   Maxie Better, LCSW 01/18/2016 10:59 AM

## 2016-01-18 NOTE — Plan of Care (Signed)
Problem: Diagnosis: Increased Risk For Suicide Attempt Goal: STG-Patient Will Comply With Medication Regime Outcome: Progressing Erin [name] is compliant with medication regime at this time.

## 2016-01-18 NOTE — Progress Notes (Signed)
[facility] MD Progress Note  01/18/2016 7:09 PM Erin [name]  MRN:  [id number] Subjective:  Erin [name] continues to be somatically focused. She states she is still feeling anxious agitates feels like she is dreaming when she is awake. States she is having increased back pain. Would like help with the anxiety-agitation and the pain.  Principal Problem: Bipolar affective disorder, depressed, severe ([place]) Diagnosis:   Patient Active Problem List   Diagnosis Date Noted  . Alcohol dependence with alcohol-induced mood disorder ([place]) [F10.24]     Priority: High  . Bipolar affective disorder, depressed, severe ([place]) [F31.4] 02/06/2015    Priority: High  . Opioid dependence ([place]) [F11.20] 05/06/2014    Priority: High  . Cocaine dependence ([place]) [F14.20] 07/17/2013    Priority: High  . HYPOTHYROIDISM [E03.9] 03/02/2009    Priority: High  . Alcohol use disorder, severe, dependence ([place]) [F10.20] 01/12/2016  . Cocaine dependence with cocaine-induced mood disorder ([place]) [F14.24]   . Opioid dependence with withdrawal ([place]) [F11.23]   . Bipolar disorder ([place]) [F31.9] 02/09/2015  . Suicidal ideations [R45.851]   . Thrombocytopenia ([place]) [D69.6] 01/06/2013  . Sarcoidosis ([place]) [D86.9] 03/30/2010  . HYPERTENSION, BENIGN ESSENTIAL [I10] 01/11/2010  . ALCOHOLIC LIVER DISEASE [id number] 03/06/2008  . CIRRHOSIS, ALCOHOLIC, LIVER [id number] 0000000  . TOBACCO DEPENDENCE [F17.200] 02/22/2007  . EXTERNAL HEMORRHOIDS [K64.4] 01/11/2005  . PORTAL HYPERTENSION [K76.6] 01/11/2005   Total Time spent with patient: 20 minutes  Past Psychiatric History: see admission H and P  Past Medical History:  Past Medical History  Diagnosis Date  . Hypothyroidism   . Blood transfusion July 2012  . Depression   . Anxiety   . Bipolar 1 disorder ([place])   . Menorrhagia   . Liver failure ([place])   . Bronchitis   . Cirrhosis of liver ([place])   . Wegener's granulomatosis ([place])   . History of sarcoidosis     Past Surgical History   Procedure Laterality Date  . Tubal ligation    . Laparoscopy    . Orthopedic surgery    . Cholecystectomy    . Fracture surgery    . Hernia repair    . Vaginal hysterectomy  07/27/2011    Procedure: HYSTERECTOMY VAGINAL;  Surgeon: [name], MD;  Location: [facility];  Service: Gynecology;  Laterality: N/[name];  Vaginal Hysterectomy with Right Oophorectomy  . Laparotomy  07/28/2011    Procedure: EXPLORATORY LAPAROTOMY;  Surgeon: [name], MD;  Location: [facility];  Service: Gynecology;  Laterality: N/[name];   Family History:  Family History  Problem Relation Age of Onset  . Diabetes Mother   . Hypertension Mother   . Diabetes Daughter   . Hypertension Daughter    Family Psychiatric  History: see admission H and P Social History:  History  Alcohol Use  . Yes    Comment: [name] fifth of vodka and [name] 12 pk [name] day of beer      History  Drug Use  . Yes  . Special: Cocaine, Marijuana    Comment: opioids, benzos    Social History   Social History  . Marital Status: Divorced    Spouse Name: N/[name]  . Number of Children: N/[name]  . Years of Education: N/[name]   Social History Main Topics  . Smoking status: Current Some Day Smoker -- 0.25 packs/day  . Smokeless tobacco: Never Used  . Alcohol Use: Yes     Comment: [name] fifth of vodka and [name] 12 pk [name] day of beer   .  Drug Use: Yes    Special: Cocaine, Marijuana     Comment: opioids, benzos  . Sexual Activity: Yes    Birth Control/ Protection: None   Other Topics Concern  . None   Social History Narrative   Additional Social History:                         Sleep: Fair  Appetite:  Poor  Current Medications: Current Facility-Administered Medications  Medication Dose Route Frequency Provider Last Rate Last Dose  . albuterol (PROVENTIL HFA;VENTOLIN HFA) 108 (90 Base) MCG/ACT inhaler 1-2 puff  1-2 puff Inhalation Q6H PRN Delfin Gant, NP      . alum & mag hydroxide-simeth (MAALOX/MYLANTA) 200-200-20 MG/5ML suspension 30 mL  30 mL  Oral PRN Delfin Gant, NP      . carisoprodol (SOMA) tablet 350 mg  350 mg Oral TID Delfin Gant, NP   350 mg at 01/18/16 1714  . docusate sodium (COLACE) capsule 100 mg  100 mg Oral BID Delfin Gant, NP   100 mg at 01/18/16 1711  . DULoxetine (CYMBALTA) DR capsule 30 mg  30 mg Oral Daily Derrill Center, NP   30 mg at 01/18/16 0813  . furosemide (LASIX) tablet 20 mg  20 mg Oral Daily Delfin Gant, NP   20 mg at 01/18/16 0813  . gabapentin (NEURONTIN) capsule 300 mg  300 mg Oral TID Nicholaus Bloom, MD   300 mg at 01/18/16 1711  . gabapentin (NEURONTIN) capsule 300 mg  300 mg Oral QHS Nicholaus Bloom, MD   300 mg at 01/17/16 2106  . hydrochlorothiazide (HYDRODIURIL) tablet 25 mg  25 mg Oral Daily Delfin Gant, NP   25 mg at 01/18/16 0813  . ibuprofen (ADVIL,MOTRIN) tablet 800 mg  800 mg Oral Q8H PRN Laverle Hobby, PA-C   800 mg at 01/17/16 2106  . loratadine (CLARITIN) tablet 10 mg  10 mg Oral Daily Delfin Gant, NP   10 mg at 01/18/16 0813  . LORazepam (ATIVAN) tablet 1 mg  1 mg Oral Q4H PRN Nicholaus Bloom, MD   1 mg at 01/18/16 1557  . multivitamin with minerals tablet 1 tablet  1 tablet Oral Daily Nicholaus Bloom, MD   1 tablet at 01/18/16 0813  . ondansetron (ZOFRAN) tablet 4 mg  4 mg Oral Q8H PRN Delfin Gant, NP   4 mg at 01/12/16 2130  . propranolol (INDERAL) tablet 20 mg  20 mg Oral BID Delfin Gant, NP   20 mg at 01/18/16 1711  . QUEtiapine (SEROQUEL) tablet 200 mg  200 mg Oral QHS Nicholaus Bloom, MD   200 mg at 01/17/16 2106  . thiamine (VITAMIN B-1) tablet 100 mg  100 mg Oral Daily Nicholaus Bloom, MD   100 mg at 01/18/16 0813  . thyroid (ARMOUR) tablet 30 mg  30 mg Oral QAC breakfast Delfin Gant, NP   30 mg at 01/18/16 0700  . traZODone (DESYREL) tablet 50 mg  50 mg Oral QHS,MR X 1 Laverle Hobby, PA-C   50 mg at 01/17/16 2106    Lab Results: No results found for this or any previous visit (from the past 48 hour(s)).  Physical  Findings: AIMS: Facial and Oral Movements Muscles of Facial Expression: None, normal Lips and Perioral Area: None, normal Jaw: None, normal Tongue: None, normal,Extremity Movements Upper (arms, wrists, hands, fingers): None, normal  Lower (legs, knees, ankles, toes): None, normal, Trunk Movements Neck, shoulders, hips: None, normal, Overall Severity Severity of abnormal movements (highest score from questions above): None, normal Incapacitation due to abnormal movements: None, normal Patient's awareness of abnormal movements (rate only patient's report): No Awareness, Dental Status Current problems with teeth and/or dentures?: No Does patient usually wear dentures?: No  CIWA:  CIWA-Ar Total: 4 COWS:     Musculoskeletal: Strength & Muscle Tone: within normal limits Gait & Station: unsteady Patient leans: Front  Psychiatric Specialty Exam: Review of Systems  Constitutional: Positive for malaise/fatigue.  Eyes: Negative.   Respiratory: Negative.   Cardiovascular: Negative.   Gastrointestinal: Negative.   Genitourinary: Negative.   Musculoskeletal: Positive for myalgias, back pain, joint pain and neck pain.  Skin: Negative.   Neurological: Positive for weakness.  Endo/Heme/Allergies: Negative.   Psychiatric/Behavioral: Positive for depression and substance abuse. The patient is nervous/anxious.     Blood pressure 100/63, pulse 83, temperature 97.3 F (36.3 C), temperature source Oral, resp. rate 20, height 5\' 7"  (1.702 m), weight 108.863 kg (240 lb).Body mass index is 37.58 kg/(m^2).  General Appearance: Fairly Groomed  Engineer, water::  Fair  Speech:  Clear and Coherent  Volume:  fluctuates  Mood:  Anxious, Depressed and Dysphoric  Affect:  anxious worried  Thought Process:  Coherent and Goal Directed  Orientation:  Full (Time, Place, and Person)  Thought Content:  symptoms events worries concerns  Suicidal Thoughts:  No  Homicidal Thoughts:  No  Memory:  Immediate;    Fair Recent;   Fair Remote;   Fair  Judgement:  Fair  Insight:  Present and Shallow  Psychomotor Activity:  Restlessness  Concentration:  Fair  Recall:  [employer] of Knowledge:Fair  Language: Fair  Akathisia:  No  Handed:  Right  AIMS (if indicated):     Assets:  Desire for Improvement Housing  ADL's:  Intact  Cognition: WNL  Sleep:  Number of Hours: 6.5   Treatment Plan Summary: Daily contact with patient to assess and evaluate symptoms and progress in treatment and Medication management  Supportive approach/coping skills Alcohol dependence/cocaine dependence; continue to work [name] relapse prevention plan Mood instability; continue to work with the Seroquel increase the dose to 50 mg TID and continue the 200 mg at HS ( her previous regime) Back pain; will continue the Neurontin at 300 mg and increase the Cymbalta to 40 mg daily Muscle spasms; increase the  Soma to QID Work with CBT/mindfulness Explore residential treatment options Erin [name] 01/18/2016, 7:09 PM

## 2016-01-18 NOTE — Progress Notes (Signed)
Patient ID: Erin [name], female   DOB: 10-29-1966, 50 y.o.   MRN: [id number]  DAR: Pt. Denies HI and auditory Hallucinations. She reports passive SI and visual hallucinations, she is able to contract for safety.  Patient does report pain and is receiving PRN pain medication which is providing relief. She reports sleep is good, appetite is poor, energy level is low, and concentration is poor. She rates depression 8/10, hopelessness 9/10, and anxiety 9/10. Support and encouragement provided to the patient. Scheduled medications administered to patient per physician's orders. Patient remains with limited insight in relation to medications. She reports agitation and asks for a medication, although patient does not appear in an agitated state. PRN Ativan administered to patient shortly before this. She remains on a 1:1 for safety and is using her wheelchair for ambulation. Q15 minute checks are maintained for safety.

## 2016-01-18 NOTE — BHH Group Notes (Signed)
[facility] LCSW Aftercare Discharge Planning Group Note   01/18/2016 9:43 AM  Participation Quality:  Invited. DID NOT ATTEND. Pt chose to remain in bed.   Smart, Kaz Auld LCSW

## 2016-01-19 MED ORDER — QUETIAPINE FUMARATE 50 MG PO TABS
75.0000 mg | ORAL_TABLET | Freq: Three times a day (TID) | ORAL | Status: DC
  Administered 2016-01-20 – 2016-01-21 (×6): 75 mg via ORAL
  Filled 2016-01-19 (×11): qty 1

## 2016-01-19 MED ORDER — DULOXETINE HCL 60 MG PO CPEP
60.0000 mg | ORAL_CAPSULE | Freq: Every day | ORAL | Status: DC
  Administered 2016-01-20 – 2016-01-26 (×7): 60 mg via ORAL
  Filled 2016-01-19 (×8): qty 1

## 2016-01-19 NOTE — Progress Notes (Signed)
1:1 note:   D: Pt has depressed affect and mood.  She has been in her room so far tonight.  She denies SI/HI, denies hallucinations.  She did not attend evening group.  She talked about how PT came to "help me get up out of this chair today."    A: Medications administered per order.  PRN medication administered for anxiety.  Met with pt and offered support and encouragement.  Actively listened to pt.  Pt remains on 1:1 for safety due to high fall risk.    R: Pt is compliant with medications and cooperative with staff.  She verbally contracts for safety.  1:1 staff remains with pt for safety.  Will continue to monitor and assess.

## 2016-01-19 NOTE — Progress Notes (Signed)
1:1 Note  Patient observed in room awake. Sitter at bedside within arms length for safety. Q 15 minute checks continue for safety. Monitoring continues.

## 2016-01-19 NOTE — BHH Group Notes (Signed)
[facility] LCSW Group Therapy  01/19/2016 1:22 PM  Type of Therapy:  Group Therapy  Participation Level:  Did Not Attend-invited. Chose to remain in bed.  Summary of Progress/Problems: [facility] Speaker came to talk about his personal journey with substance abuse and addiction. The pt processed ways by which to relate to the speaker. [place] speaker provided handouts and educational information pertaining to groups and services offered by the [facility].   Smart, Lida Berkery LCSW 01/19/2016, 1:22 PM

## 2016-01-19 NOTE — Progress Notes (Signed)
[facility] MD Progress Note  01/19/2016 6:32 PM TYANAH TIETJEN  MRN:  [id number] Subjective:  Erin [name] was evaluated by PT she is still endorsing persistent back pain. The increased in Soma help some. She is still endorsing feeling nervous agitated during the day. She would like the Seroquel increased. She states she is doing OK at night. She is having the most problems with the "nerves" during the day Principal Problem: Bipolar affective disorder, depressed, severe ([place]) Diagnosis:   Patient Active Problem List   Diagnosis Date Noted  . Alcohol dependence with alcohol-induced mood disorder ([place]) [F10.24]     Priority: High  . Bipolar affective disorder, depressed, severe ([place]) [F31.4] 02/06/2015    Priority: High  . Opioid dependence ([place]) [F11.20] 05/06/2014    Priority: High  . Cocaine dependence ([place]) [F14.20] 07/17/2013    Priority: High  . HYPOTHYROIDISM [E03.9] 03/02/2009    Priority: High  . Alcohol use disorder, severe, dependence ([place]) [F10.20] 01/12/2016  . Cocaine dependence with cocaine-induced mood disorder ([place]) [F14.24]   . Opioid dependence with withdrawal ([place]) [F11.23]   . Bipolar disorder ([place]) [F31.9] 02/09/2015  . Suicidal ideations [R45.851]   . Thrombocytopenia ([place]) [D69.6] 01/06/2013  . Sarcoidosis ([place]) [D86.9] 03/30/2010  . HYPERTENSION, BENIGN ESSENTIAL [I10] 01/11/2010  . ALCOHOLIC LIVER DISEASE A999333 03/06/2008  . CIRRHOSIS, ALCOHOLIC, LIVER [id number] 0000000  . TOBACCO DEPENDENCE [F17.200] 02/22/2007  . EXTERNAL HEMORRHOIDS [K64.4] 01/11/2005  . PORTAL HYPERTENSION [K76.6] 01/11/2005   Total Time spent with patient: 20 minutes  Past Psychiatric History: see admission H and P  Past Medical History:  Past Medical History  Diagnosis Date  . Hypothyroidism   . Blood transfusion July 2012  . Depression   . Anxiety   . Bipolar 1 disorder ([place])   . Menorrhagia   . Liver failure ([place])   . Bronchitis   . Cirrhosis of liver ([place])   . Wegener's  granulomatosis ([place])   . History of sarcoidosis     Past Surgical History  Procedure Laterality Date  . Tubal ligation    . Laparoscopy    . Orthopedic surgery    . Cholecystectomy    . Fracture surgery    . Hernia repair    . Vaginal hysterectomy  07/27/2011    Procedure: HYSTERECTOMY VAGINAL;  Surgeon: [name], MD;  Location: [facility];  Service: Gynecology;  Laterality: N/A;  Vaginal Hysterectomy with Right Oophorectomy  . Laparotomy  07/28/2011    Procedure: EXPLORATORY LAPAROTOMY;  Surgeon: [name], MD;  Location: [facility];  Service: Gynecology;  Laterality: N/A;   Family History:  Family History  Problem Relation Age of Onset  . Diabetes Mother   . Hypertension Mother   . Diabetes Daughter   . Hypertension Daughter    Family Psychiatric  History: see admission H and P Social History:  History  Alcohol Use  . Yes    Comment: a fifth of vodka and a 12 pk a day of beer      History  Drug Use  . Yes  . Special: Cocaine, Marijuana    Comment: opioids, benzos    Social History   Social History  . Marital Status: Divorced    Spouse Name: N/A  . Number of Children: N/A  . Years of Education: N/A   Social History Main Topics  . Smoking status: Current Some Day Smoker -- 0.25 packs/day  . Smokeless tobacco: Never Used  . Alcohol Use: Yes     Comment:  a fifth of vodka and a 12 pk a day of beer   . Drug Use: Yes    Special: Cocaine, Marijuana     Comment: opioids, benzos  . Sexual Activity: Yes    Birth Control/ Protection: None   Other Topics Concern  . None   Social History Narrative   Additional Social History:                         Sleep: Fair  Appetite:  Fair  Current Medications: Current Facility-Administered Medications  Medication Dose Route Frequency Provider Last Rate Last Dose  . albuterol (PROVENTIL HFA;VENTOLIN HFA) 108 (90 Base) MCG/ACT inhaler 1-2 puff  1-2 puff Inhalation Q6H PRN Delfin Gant, NP      . alum & mag  hydroxide-simeth (MAALOX/MYLANTA) 200-200-20 MG/5ML suspension 30 mL  30 mL Oral PRN Delfin Gant, NP      . carisoprodol (SOMA) tablet 350 mg  350 mg Oral QID Nicholaus Bloom, MD   350 mg at 01/19/16 1655  . docusate sodium (COLACE) capsule 100 mg  100 mg Oral BID Delfin Gant, NP   100 mg at 01/19/16 1655  . DULoxetine (CYMBALTA) DR capsule 40 mg  40 mg Oral Daily Nicholaus Bloom, MD   40 mg at 01/19/16 0743  . furosemide (LASIX) tablet 20 mg  20 mg Oral Daily Delfin Gant, NP   20 mg at 01/18/16 0813  . gabapentin (NEURONTIN) capsule 300 mg  300 mg Oral TID Nicholaus Bloom, MD   300 mg at 01/19/16 1655  . gabapentin (NEURONTIN) capsule 300 mg  300 mg Oral QHS Nicholaus Bloom, MD   300 mg at 01/18/16 2119  . hydrochlorothiazide (HYDRODIURIL) tablet 25 mg  25 mg Oral Daily Delfin Gant, NP   25 mg at 01/18/16 0813  . ibuprofen (ADVIL,MOTRIN) tablet 800 mg  800 mg Oral Q8H PRN Laverle Hobby, PA-C   800 mg at 01/17/16 2106  . loratadine (CLARITIN) tablet 10 mg  10 mg Oral Daily Delfin Gant, NP   10 mg at 01/19/16 0744  . LORazepam (ATIVAN) tablet 1 mg  1 mg Oral Q6H PRN Nicholaus Bloom, MD   1 mg at 01/19/16 0748  . multivitamin with minerals tablet 1 tablet  1 tablet Oral Daily Nicholaus Bloom, MD   1 tablet at 01/19/16 0744  . ondansetron (ZOFRAN) tablet 4 mg  4 mg Oral Q8H PRN Delfin Gant, NP   4 mg at 01/12/16 2130  . propranolol (INDERAL) tablet 20 mg  20 mg Oral BID Delfin Gant, NP   20 mg at 01/18/16 1711  . QUEtiapine (SEROQUEL) tablet 200 mg  200 mg Oral QHS Nicholaus Bloom, MD   200 mg at 01/18/16 2120  . QUEtiapine (SEROQUEL) tablet 50 mg  50 mg Oral TID Nicholaus Bloom, MD   50 mg at 01/19/16 1655  . thiamine (VITAMIN B-1) tablet 100 mg  100 mg Oral Daily Nicholaus Bloom, MD   100 mg at 01/19/16 0744  . thyroid (ARMOUR) tablet 30 mg  30 mg Oral QAC breakfast Delfin Gant, NP   30 mg at 01/19/16 [id number]  . traZODone (DESYREL) tablet 50 mg  50 mg Oral QHS,MR  X 1 Laverle Hobby, PA-C   50 mg at 01/18/16 2120    Lab Results: No results found for this or any previous visit (from  the past 48 hour(s)).  Physical Findings: AIMS: Facial and Oral Movements Muscles of Facial Expression: None, normal Lips and Perioral Area: None, normal Jaw: None, normal Tongue: None, normal,Extremity Movements Upper (arms, wrists, hands, fingers): None, normal Lower (legs, knees, ankles, toes): None, normal, Trunk Movements Neck, shoulders, hips: None, normal, Overall Severity Severity of abnormal movements (highest score from questions above): None, normal Incapacitation due to abnormal movements: None, normal Patient's awareness of abnormal movements (rate only patient's report): No Awareness, Dental Status Current problems with teeth and/or dentures?: No Does patient usually wear dentures?: No  CIWA:  CIWA-Ar Total: 4 COWS:     Musculoskeletal: Strength & Muscle Tone: within normal limits Gait & Station: unsteady Patient leans: Front  Psychiatric Specialty Exam: Review of Systems  Constitutional: Positive for malaise/fatigue.  HENT: Negative.   Eyes: Negative.   Respiratory: Negative.   Cardiovascular: Negative.   Gastrointestinal: Negative.   Genitourinary: Negative.   Musculoskeletal: Positive for back pain and joint pain.  Skin: Negative.   Neurological: Positive for weakness.  Endo/Heme/Allergies: Negative.   Psychiatric/Behavioral: Positive for depression and substance abuse. The patient is nervous/anxious.     Blood pressure 94/67, pulse 88, temperature 97.7 F (36.5 C), temperature source Oral, resp. rate 18, height 5\' 7"  (1.702 m), weight 108.863 kg (240 lb).Body mass index is 37.58 kg/(m^2).  General Appearance: Fairly Groomed  Engineer, water::  Fair  Speech:  Clear and Coherent  Volume:  Decreased  Mood:  Anxious, Depressed and worried  Affect:  anxious worried depressed  Thought Process:  Coherent and Goal Directed  Orientation:   Full (Time, Place, and Person)  Thought Content:  symptoms events worries concerns  Suicidal Thoughts:  No  Homicidal Thoughts:  No  Memory:  Immediate;   Fair Recent;   Fair Remote;   Fair  Judgement:  Fair  Insight:  Present and Shallow  Psychomotor Activity:  Restlessness  Concentration:  Fair  Recall:  [employer] of Knowledge:Fair  Language: Fair  Akathisia:  No  Handed:  Right  AIMS (if indicated):     Assets:  Desire for Improvement Housing  ADL's:  Intact  Cognition: WNL  Sleep:  Number of Hours: 6.75   Treatment Plan Summary: Daily contact with patient to assess and evaluate symptoms and progress in treatment and Medication management Supportive approach/coping skills Alcohol cocaine dependence; continue to work a relapse prevention plan Mood instability-agitation; increase the Seroquel to 75 mg TID/200 mg HS Unstable gait: will continue to work with  PT and follow recommendations Pain; will continue the Neurontin 300 mg QID Depression/anxiety/pain; will continue the Cymbalta and increase to 60 mg Use CBT/mindfulness Sevana Grandinetti A 01/19/2016, 6:32 PM

## 2016-01-19 NOTE — Progress Notes (Signed)
Pt did not attend wrap up group. RN notified.

## 2016-01-19 NOTE — BHH Group Notes (Signed)
Adult Psychoeducational Group Note  Date:  01/19/2016 Time:  9:59 PM  Group Topic/Focus:  AA Group  Participation Level:  Did Not Attend  Participation Quality:  NOne  Affect:  None  Cognitive:  None  Insight: None  Engagement in Group:  None  Modes of Intervention:  Discussion and Education  Additional Comments:  Pt did not attend group.  Erin [name] A 01/19/2016, 9:59 PM

## 2016-01-19 NOTE — Progress Notes (Signed)
1:1 Note  Patient observed in room sleeping. Respirations even and non labored. No distress noted. Sitter remains at bedside within arms length. Q 15 minute checks in progress and maintained.  Patient remains safe on unit. Monitoring continues.

## 2016-01-19 NOTE — BHH Group Notes (Signed)
[facility] Group Notes:  (Nursing/MHT/Case Management/Adjunct)  Date:  01/19/2016  Time: 0845 am  Type of Therapy:  Psychoeducational Skills  Participation Level:  Minimal  Participation Quality:  Drowsy  Affect:  Lethargic  Cognitive:  Lacking  Insight:  Lacking  Engagement in Group:  Lacking  Modes of Intervention:  Support  Summary of Progress/Problems: Taneshia is worried about getting out of the wheelchair.  She is concerned that if she starts walking with a walker by herself, she will fall.    Zipporah Plants 01/19/2016, 9:06 AM

## 2016-01-19 NOTE — Progress Notes (Signed)
1:1 observation note:  Patient will continue 1:1 until tomorrow.  Patient is currently ambulating with a walker and is steady on her feet.  Will reassess in the am per MD.

## 2016-01-19 NOTE — Progress Notes (Signed)
D: Patient continues to complain of severe anxiety.  She presents with calm affect this morning.  She is concerned about ambulating with a walker, even though she has been observed walking back and forth to the bathroom with no problem.  Ordered PT consult per MD.  Patient went to group; she did participate.  She appears sedated and lethargic.  She is medication focused.  She continues to be on 1:1 observation for safety.  She denies HI/AVH; passive SI.  She contracts for safety. A: Continue to monitor medication management and MD orders.  Safety checks completed every 15 minutes per protocol.  Offer support and encouragement as needed. R: Patient is receptive to staff; her behavior is appropriate.

## 2016-01-19 NOTE — Evaluation (Signed)
Physical Therapy Evaluation Patient Details Name: Erin [name] MRN: [id number] DOB: [birth year] Today's Date: 01/19/2016   History of Present Illness  Pt presented to ED requesting help for he dependence of alcohol, cocaine, opiods, and bipolar disorder. At this time she states she now realizes how her back hurts, how she feels unsteady and weak, so PT is here ot evaluate that today due to pt has been using WC during this [facility]  stay.   Clinical Impression  Pt tolerated session well, very interactive and involved during session asking questions and very cooperative with testing.  Pt with positive , increase LB pain with L hip flexion in sitting and supine. Began education with proper body mechanics, bed mobility and sleeping positions, with need for walking and a few simple supine LB stretches. Pt agreed to goal of walking next few days to nurses station and back at least 3xs a day with staff, and increase that as she feels better and gains more strength. Pt to benefit from continued PT to increase strength, stability with mobility, and exercise program.     Follow Up Recommendations  (unsure at this time depends on pt's progression while here. )    Equipment Recommendations  Rolling walker with 5" wheels (depedns on pt's progression may not need this upon DC. )    Recommendations for Other Services       Precautions / Restrictions Precautions Precautions: Fall Precaution Comments: states she almost fell here, has a Actuary Restrictions Weight Bearing Restrictions: No      Mobility  Bed Mobility Overal bed mobility: Independent             General bed mobility comments: educated with bed mobility properly to help decrease initiating pain. Proper bed positionign and sleeping positions as well   Transfers Overall transfer level: Independent Equipment used: Rolling walker (2 wheeled)                Ambulation/Gait Ambulation/Gait assistance: Min guard Ambulation  Distance (Feet): 100 Feet Assistive device: Rolling walker (2 wheeled) Gait Pattern/deviations: Step-through pattern     General Gait Details: could tell by end of walk pt's LE strength was feeling a bit more weak and pt stated this. Also as distance increased pt reported her LB pain increased and educated to utilize UE s on RW if needed.   Stairs            Wheelchair Mobility    Modified Rankin (Stroke Patients Only)       Balance Overall balance assessment: No apparent balance deficits (not formally assessed) (not formally asses due to need of RW and LB pain, etc. )                                           Pertinent Vitals/Pain Pain Assessment:  (states she has back pain, unable to rate reliably, very vague. but location was specific each time to middle and left lower back area exacerbated by seated left hip flexion or supine SLR on Left side. )    Home Living Family/patient expects to be discharged to:: Private residence Living Arrangements: Alone (unsure, pt vague with answering these questions )     Home Access: Stairs to enter Entrance Stairs-Rails: None Entrance Stairs-Number of Steps: 3          Prior Function Level of Independence: Independent  Comments: pt stated she was independent and walked a lot PLOF     Hand Dominance        Extremity/Trunk Assessment               Lower Extremity Assessment: Generalized weakness (functional but weakness and pain in left low back area initated with Left hip flexion )         Communication   Communication: No difficulties (seemed slightly slurred or lethargic in her speech during session , but comprehendable)  Cognition Arousal/Alertness: Awake/alert Behavior During Therapy: WFL for tasks assessed/performed Overall Cognitive Status: Within Functional Limits for tasks assessed                      General Comments      Exercises Other Exercises Other  Exercises: reviewed, educated and pt performed  knee to chest one leg at a time while in hooklying with 15 second hold each , 3 times each leg.       Assessment/Plan    PT Assessment Patient needs continued PT services  PT Diagnosis Difficulty walking;Generalized weakness   PT Problem List Decreased strength;Decreased activity tolerance;Decreased mobility  PT Treatment Interventions Gait training;Functional mobility training;Therapeutic activities;Therapeutic exercise   PT Goals (Current goals can be found in the Care Plan section) Acute Rehab PT Goals Patient Stated Goal: I want to get better . (Pt was attentive during session, actively listening, asking questions, and seeking advice from me for mobility ).  PT Goal Formulation: With patient Time For Goal Achievement: 02/02/16 Potential to Achieve Goals: Good    Frequency Min 2X/week   Barriers to discharge        Co-evaluation               End of Session Equipment Utilized During Treatment: Gait belt Activity Tolerance: Patient tolerated treatment well Patient left: in bed;with nursing/sitter in room Nurse Communication: Mobility status (made goal with pt (and sitter aware) to walk to /from nurses station with staff at least 3xs a day., with RW as of now with hopes to progress off of this for later. )    Functional Assessment Tool Used: clinical judgement Functional Limitation: Mobility: Walking and moving around Mobility: Walking and Moving Around Current Status [id number]): At least 1 percent but less than 20 percent impaired, limited or restricted Mobility: Walking and Moving Around Goal Status [phone]): 0 percent impaired, limited or restricted    Time: 1510-1540 PT Time Calculation (min) (ACUTE ONLY): 30 min   Charges:   PT Evaluation $PT Eval Low Complexity: 1 Procedure PT Treatments $Gait Training: 8-22 mins   PT G Codes:   PT G-Codes **NOT FOR INPATIENT CLASS** Functional Assessment Tool Used: clinical  judgement Functional Limitation: Mobility: Walking and moving around Mobility: Walking and Moving Around Current Status [id number]): At least 1 percent but less than 20 percent impaired, limited or restricted Mobility: Walking and Moving Around Goal Status [phone]): 0 percent impaired, limited or restricted    Sebron Mcmahill, [facility] 01/19/2016, 5:35 PM Clide Dales, PT Pager: [phone] 01/19/2016

## 2016-01-19 NOTE — Progress Notes (Signed)
1:1 observation note:  Patient has been up walking to bathroom.  Requested assistance from MHT to take a bath.  Awaiting PT to come and assess her walking ability.  PT was contacted and they will bring a walker.  Patient reports passive SI; contracts for safety on the unit.  Focused on medications.

## 2016-01-20 MED ORDER — INFLUENZA VAC SPLIT QUAD 0.5 ML IM SUSY
0.5000 mL | PREFILLED_SYRINGE | INTRAMUSCULAR | Status: AC
  Administered 2016-01-21: 0.5 mL via INTRAMUSCULAR
  Filled 2016-01-20: qty 0.5

## 2016-01-20 NOTE — BHH Group Notes (Signed)
[facility] LCSW Aftercare Discharge Planning Group Note   01/20/2016 11:28 AM  Participation Quality:  Appropriate   Mood/Affect:  Appropriate  Depression Rating:  8  Anxiety Rating:  8  Thoughts of Suicide:  No Will you contract for safety?   NA  Current AVH: No   Plan for Discharge/Comments:  Pt reports that she plans to return home at d/c. "My pain is lower now that the doctor changed my meds." Pt plans to follow-up outpatient and go to AA/NA and mental health support groups through the [place]. Pt is hoping to get connected to the [employer] "I want to swim and I have medicaid and medicare."   Transportation Means: bus  Supports: none identified by pt.   Smart, Kodie Kishi LCSW

## 2016-01-20 NOTE — Progress Notes (Addendum)
1:1 progress note:  Pt has been up for a while this evening.  She says she is doing a little better.  She is still using the wheelchair for most of her transfer, but does walk in her room.  Pt denies SI/HI/AVH at this time.  She had a visitor this evening and that brightened her mood.  She reports her withdrawal symptoms are mild.  Her discharge plans are in process.  Pt continues on 1:1 for safety d/t her unsteady gait, but that is improving.  Sitter at bedside.  Pt safe at this time.

## 2016-01-20 NOTE — Progress Notes (Signed)
Patient ID: Erin [name], female   DOB: 11-01-1966, 50 y.o.   MRN: [id number] PER STATE REGULATIONS 482.30  THIS CHART WAS REVIEWED FOR MEDICAL NECESSITY WITH RESPECT TO THE PATIENT'S ADMISSION/ DURATION OF STAY.  NEXT REVIEW DATE:   01/24/2016  Chauncy Lean, RN, BSN CASE MANAGER

## 2016-01-20 NOTE — Progress Notes (Signed)
Patient ID: Erin [name], female   DOB: 07/10/1966, 50 y.o.   MRN: [id number]  1:1 Nursing Note-  Patient is laying in her bed awake at this time. She states, "I'm trying to get this to work" and points at a heat pack underneath her back. She is calm and respirations are even and unlabored. 1:1 is continued for safety. Pt. Denies SI/HI and Auditory Hallucinations. She continues to endorse visual hallucinations but does not elaborate. Patient reports pain and anxiety this morning and received medication for both symptoms which provided Support and encouragement provided to the patient. Patient is receptive and cooperative. She reports sleep was good last night, appetite is fair, energy level is low, and concentration is poor. She rates depression, anxiety, and hopelessness 8/10. She is seen in the milieu with her walker, gait is still a little unsteady. Q15 minute checks are maintained for safety.

## 2016-01-20 NOTE — Progress Notes (Signed)
1:1 Note  D: Pt is resting in bed with eyes closed.  Respirations are even and unlabored.  Pt does not appear to be in any distress.    A: 1:1 continues for safety.    R: Pt is safe on the unit.

## 2016-01-20 NOTE — Progress Notes (Signed)
Patient ID: Erin [name], female   DOB: 1966-06-27, 50 y.o.   MRN: [id number]  1:1 Nursing Note-  Patient is sitting in her wheelchair in the cafeteria eating at this time. Patient appears in no distress. Patient's 1:1 is continued for safety and Q15 minute safety checks as well.

## 2016-01-20 NOTE — Progress Notes (Signed)
1:1 Note  D: Pt is resting in bed with eyes closed.  Respirations are even and unlabored.  Pt does not appear to be in any distress.  A: 1:1 continues for safety.  R: Pt is safe on the unit.

## 2016-01-20 NOTE — Progress Notes (Signed)
Patient ID: Erin [name], female   DOB: [birth year], 50 y.o.   MRN: [id number]  1:1 Nursing Note-   Patient is sitting in her wheelchair in the dayroom attending group at this time. Patient appears calm and no distress noted. She appears to be listening to a peer speaking. 1:1 is maintained for safety and Q15 minute safety checks are maintained.

## 2016-01-20 NOTE — Progress Notes (Signed)
[facility] MD Progress Note  01/20/2016 6:30 PM Erin [name]  MRN:  [id number] Subjective:  Erin [name] has tolerated the increase in Seroquel well. She still feels unsteady with the walker. She states she has gotten some benefit from the increased in the [place]. She continues to worry about a possible relapse. She is having a lot of anxiety. Does say the distorted  perceptual experiences she has been having are some better. States she is afraid of relapsing. Does not think she is going to make alive if she was to relapse Principal Problem: Bipolar affective disorder, depressed, severe ([place]) Diagnosis:   Patient Active Problem List   Diagnosis Date Noted  . Alcohol dependence with alcohol-induced mood disorder ([place]) [F10.24]     Priority: High  . Bipolar affective disorder, depressed, severe ([place]) [F31.4] 02/06/2015    Priority: High  . Opioid dependence ([place]) [F11.20] 05/06/2014    Priority: High  . Cocaine dependence ([place]) [F14.20] 07/17/2013    Priority: High  . HYPOTHYROIDISM [E03.9] 03/02/2009    Priority: High  . Alcohol use disorder, severe, dependence ([place]) [F10.20] 01/12/2016  . Cocaine dependence with cocaine-induced mood disorder ([place]) [F14.24]   . Opioid dependence with withdrawal ([place]) [F11.23]   . Bipolar disorder ([place]) [F31.9] 02/09/2015  . Suicidal ideations [R45.851]   . Thrombocytopenia ([place]) [D69.6] 01/06/2013  . Sarcoidosis ([place]) [D86.9] 03/30/2010  . HYPERTENSION, BENIGN ESSENTIAL [I10] 01/11/2010  . ALCOHOLIC LIVER DISEASE [id number] 03/06/2008  . CIRRHOSIS, ALCOHOLIC, LIVER [id number]  . TOBACCO DEPENDENCE [F17.200] 02/22/2007  . EXTERNAL HEMORRHOIDS [K64.4] 01/11/2005  . PORTAL HYPERTENSION [K76.6] 01/11/2005   Total Time spent with patient: 20 minutes  Past Psychiatric History: see admission H and P  Past Medical History:  Past Medical History  Diagnosis Date  . Hypothyroidism   . Blood transfusion July 2012  . Depression   . Anxiety   . Bipolar 1  disorder ([place])   . Menorrhagia   . Liver failure ([place])   . Bronchitis   . Cirrhosis of liver ([place])   . Wegener's granulomatosis ([place])   . History of sarcoidosis     Past Surgical History  Procedure Laterality Date  . Tubal ligation    . Laparoscopy    . Orthopedic surgery    . Cholecystectomy    . Fracture surgery    . Hernia repair    . Vaginal hysterectomy  07/27/2011    Procedure: HYSTERECTOMY VAGINAL;  Surgeon: [name], MD;  Location: [place] [facility];  Service: Gynecology;  Laterality: N/A;  Vaginal Hysterectomy with Right Oophorectomy  . Laparotomy  07/28/2011    Procedure: EXPLORATORY LAPAROTOMY;  Surgeon: [name], MD;  Location: [facility] [facility];  Service: Gynecology;  Laterality: N/A;   Family History:  Family History  Problem Relation Age of Onset  . Diabetes Mother   . Hypertension Mother   . Diabetes Daughter   . Hypertension Daughter    Family Psychiatric  History: see admission H and P Social History:  History  Alcohol Use  . Yes    Comment: a fifth of vodka and a 12 pk a day of beer      History  Drug Use  . Yes  . Special: Cocaine, Marijuana    Comment: opioids, benzos    Social History   Social History  . Marital Status: Divorced    Spouse Name: N/A  . Number of Children: N/A  . Years of Education: N/A   Social History Main Topics  . Smoking status:  Current Some Day Smoker -- 0.25 packs/day  . Smokeless tobacco: Never Used  . Alcohol Use: Yes     Comment: a fifth of vodka and a 12 pk a day of beer   . Drug Use: Yes    Special: Cocaine, Marijuana     Comment: opioids, benzos  . Sexual Activity: Yes    Birth Control/ Protection: None   Other Topics Concern  . None   Social History Narrative   Additional Social History:                         Sleep: Fair  Appetite:  Fair  Current Medications: Current Facility-Administered Medications  Medication Dose Route Frequency Provider Last Rate Last Dose  . albuterol (PROVENTIL  HFA;VENTOLIN HFA) 108 (90 Base) MCG/ACT inhaler 1-2 puff  1-2 puff Inhalation Q6H PRN Delfin Gant, NP      . alum & mag hydroxide-simeth (MAALOX/MYLANTA) 200-200-20 MG/5ML suspension 30 mL  30 mL Oral PRN Delfin Gant, NP      . carisoprodol (SOMA) tablet 350 mg  350 mg Oral QID Nicholaus Bloom, MD   350 mg at 01/20/16 1704  . docusate sodium (COLACE) capsule 100 mg  100 mg Oral BID Delfin Gant, NP   100 mg at 01/20/16 1704  . DULoxetine (CYMBALTA) DR capsule 60 mg  60 mg Oral Daily Nicholaus Bloom, MD   60 mg at 01/20/16 0759  . furosemide (LASIX) tablet 20 mg  20 mg Oral Daily Delfin Gant, NP   20 mg at 01/20/16 0759  . gabapentin (NEURONTIN) capsule 300 mg  300 mg Oral TID Nicholaus Bloom, MD   300 mg at 01/20/16 1704  . gabapentin (NEURONTIN) capsule 300 mg  300 mg Oral QHS Nicholaus Bloom, MD   300 mg at 01/19/16 2115  . hydrochlorothiazide (HYDRODIURIL) tablet 25 mg  25 mg Oral Daily Delfin Gant, NP   25 mg at 01/20/16 0759  . ibuprofen (ADVIL,MOTRIN) tablet 800 mg  800 mg Oral Q8H PRN Laverle Hobby, PA-C   800 mg at 01/17/16 2106  . [START ON 01/21/2016] Influenza vac split quadrivalent PF (FLUARIX) injection 0.5 mL  0.5 mL Intramuscular Tomorrow-1000 Nicholaus Bloom, MD      . loratadine (CLARITIN) tablet 10 mg  10 mg Oral Daily Delfin Gant, NP   10 mg at 01/20/16 0759  . LORazepam (ATIVAN) tablet 1 mg  1 mg Oral Q6H PRN Nicholaus Bloom, MD   1 mg at 01/20/16 0802  . multivitamin with minerals tablet 1 tablet  1 tablet Oral Daily Nicholaus Bloom, MD   1 tablet at 01/20/16 0759  . ondansetron (ZOFRAN) tablet 4 mg  4 mg Oral Q8H PRN Delfin Gant, NP   4 mg at 01/12/16 2130  . propranolol (INDERAL) tablet 20 mg  20 mg Oral BID Delfin Gant, NP   20 mg at 01/20/16 0759  . QUEtiapine (SEROQUEL) tablet 200 mg  200 mg Oral QHS Nicholaus Bloom, MD   200 mg at 01/19/16 2115  . QUEtiapine (SEROQUEL) tablet 75 mg  75 mg Oral TID Nicholaus Bloom, MD   75 mg at  01/20/16 1704  . thiamine (VITAMIN B-1) tablet 100 mg  100 mg Oral Daily Nicholaus Bloom, MD   100 mg at 01/20/16 0759  . thyroid (ARMOUR) tablet 30 mg  30 mg Oral QAC breakfast Reginold Agent  Sharlene Motts, NP   30 mg at 01/20/16 0615  . traZODone (DESYREL) tablet 50 mg  50 mg Oral QHS,MR X 1 Laverle Hobby, PA-C   50 mg at 01/19/16 2115    Lab Results: No results found for this or any previous visit (from the past 48 hour(s)).  Physical Findings: AIMS: Facial and Oral Movements Muscles of Facial Expression: None, normal Lips and Perioral Area: None, normal Jaw: None, normal Tongue: None, normal,Extremity Movements Upper (arms, wrists, hands, fingers): None, normal Lower (legs, knees, ankles, toes): None, normal, Trunk Movements Neck, shoulders, hips: None, normal, Overall Severity Severity of abnormal movements (highest score from questions above): None, normal Incapacitation due to abnormal movements: None, normal Patient's awareness of abnormal movements (rate only patient's report): No Awareness, Dental Status Current problems with teeth and/or dentures?: No Does patient usually wear dentures?: No  CIWA:  CIWA-Ar Total: 4 COWS:     Musculoskeletal: Strength & Muscle Tone: within normal limits Gait & Station: unsteady Patient leans: Front  Psychiatric Specialty Exam: Review of Systems  Constitutional: Positive for malaise/fatigue.  HENT: Negative.   Eyes: Negative.   Respiratory: Negative.   Cardiovascular: Negative.   Gastrointestinal: Negative.   Genitourinary: Negative.   Musculoskeletal: Positive for back pain.  Skin: Negative.   Neurological: Positive for weakness.  Endo/Heme/Allergies: Negative.   Psychiatric/Behavioral: Positive for depression and substance abuse. The patient is nervous/anxious.     Blood pressure 87/53, pulse 74, temperature 98 F (36.7 C), temperature source Oral, resp. rate 17, height 5\' 7"  (1.702 m), weight 108.863 kg (240 lb).Body mass index is  37.58 kg/(m^2).  General Appearance: Fairly Groomed  Engineer, water::  Fair  Speech:  Clear and Coherent  Volume:  fluctuates  Mood:  Anxious, Depressed and Dysphoric  Affect:  anxious worried  Thought Process:  Coherent and Goal Directed  Orientation:  Full (Time, Place, and Person)  Thought Content:  symptoms events worries concerns  Suicidal Thoughts:  No  Homicidal Thoughts:  No  Memory:  Immediate;   Fair Recent;   Fair Remote;   Fair  Judgement:  Fair  Insight:  Present and Shallow  Psychomotor Activity:  Restlessness  Concentration:  Fair  Recall:  [employer] of Knowledge:Fair  Language: Fair  Akathisia:  No  Handed:  Right  AIMS (if indicated):     Assets:  Desire for Improvement Housing  ADL's:  Intact  Cognition: WNL  Sleep:  Number of Hours: 6.5   Treatment Plan Summary: Daily contact with patient to assess and evaluate symptoms and progress in treatment and Medication management Supportive approach/coping skills Alcohol/cocaine dependence; continue to work a relapse prevention plan Mood instability: will continue the Seroquel 75 mg TID/200 mg HS ( she is not sedated with the increased in dose) Depression; continue the Cymbalta 60 mg daily Pain; continue the Cymbalta/Neurontin/Soma Follow PT recommendations Work with CBT/mindfulness Tayton Decaire A 01/20/2016, 6:30 PM

## 2016-01-20 NOTE — BHH Group Notes (Signed)
[place] LCSW Group Therapy  01/20/2016 3:33 PM  Type of Therapy:  Group Therapy  Participation Level:  Active  Participation Quality:  Attentive  Affect:  Appropriate  Cognitive:  Alert and Oriented  Insight:  Engaged  Engagement in Therapy:  Improving  Modes of Intervention:  Confrontation, Discussion, Education, Exploration, Problem-solving, Rapport Building, Socialization and Support  Summary of Progress/Problems: Today's Topic: Overcoming Obstacles. Patients identified one short term goal and potential obstacles in reaching this goal. Patients processed barriers involved in overcoming these obstacles. Patients identified steps necessary for overcoming these obstacles and explored motivation (internal and external) for facing these difficulties head on. Erin [name] was attentive and engaged during today's processing group. Erin [name] shared that her biggest obstacle is "changing playgrounds when I get out of here." Erin [name] stated that her old hangouts were mostly bars and places where she would get high. "I want to join the [facility] and get healthy again, maybe swim." She continues to demonstrate improving insight and progress in the group setting.   Smart, Jemiah Cuadra LCSW 01/20/2016, 3:33 PM

## 2016-01-20 NOTE — Progress Notes (Signed)
Pt attended NA group this evening.  

## 2016-01-20 NOTE — Plan of Care (Signed)
Problem: Ineffective individual coping Goal: STG: Patient will remain free from self harm Outcome: Progressing She remains free from self harm, has 1:1 observation

## 2016-01-21 DIAGNOSIS — F314 Bipolar disorder, current episode depressed, severe, without psychotic features: Secondary | ICD-10-CM | POA: Diagnosis not present

## 2016-01-21 MED ORDER — TRAZODONE 25 MG HALF TABLET
25.0000 mg | ORAL_TABLET | Freq: Three times a day (TID) | ORAL | Status: DC
  Administered 2016-01-22 – 2016-01-26 (×13): 25 mg via ORAL
  Filled 2016-01-21 (×16): qty 1

## 2016-01-21 MED ORDER — QUETIAPINE FUMARATE 50 MG PO TABS
50.0000 mg | ORAL_TABLET | Freq: Three times a day (TID) | ORAL | Status: DC
  Administered 2016-01-22 – 2016-01-26 (×13): 50 mg via ORAL
  Filled 2016-01-21 (×16): qty 1

## 2016-01-21 NOTE — Progress Notes (Signed)
Patient observed in cafeteria eating dinner. Staff present. Ambulating in wheelchair. No current complaints. 1:1 order renewed per Dr. Sabra Heck. Fall precautions in place and patient verbalizes understanding. She remains safe on 1:1. Jamie Kato

## 2016-01-21 NOTE — Progress Notes (Signed)
Physical Therapy Treatment Patient Details Name: Erin [name] MRN: [id number] DOB: [birth year] Today's Date: 01/21/2016    History of Present Illness Pt presented to ED requesting help for he dependence of alcohol, cocaine, opiods, and bipolar disorder. At this time she states she now realizes how her back hurts, how she feels unsteady and weak, so PT is here ot evaluate that today due to pt has been using WC during this [facility]  stay.     PT Comments    Pt has a 1:1 sitter who has been amb her with RW to and from group.  For longer distances (meals) pt uses wc.  Assisted with amb a great distance in hallway noted shuffled/sluggish/short step gait.  Performed BERG balance test in which pt scored 32/56 indicating HIGH FALL RISK.  Greatest difficulties were narrow stance, tandum stance, alternating step up and one leg stance.  Min c/o low back pain and inability to reach down to toes. Pt stated she is suppose to get an MRI.  Regardless, pt does need to use her walker based on BERG balance test.  Follow Up Recommendations        Equipment Recommendations  Rolling walker with 5" wheels    Recommendations for Other Services       Precautions / Restrictions Precautions Precautions: Fall Precaution Comments: 1:1 Restrictions Weight Bearing Restrictions: No    Mobility  Bed Mobility               General bed mobility comments: NT  Transfers Overall transfer level: Independent Equipment used: Rolling walker (2 wheeled)             General transfer comment: definate use of hands to assist  Ambulation/Gait Ambulation/Gait assistance: Supervision;Min guard Ambulation Distance (Feet): 225 Feet Assistive device: Rolling walker (2 wheeled) Gait Pattern/deviations: Step-through pattern;Wide base of support;Decreased stride length;Shuffle Gait velocity: WFL   General Gait Details: pt states, "they make me walk atleast 3 times a day."  uses wc to dinning room because "it is  too far".   Stairs            Wheelchair Mobility    Modified Rankin (Stroke Patients Only)       Balance                                    Cognition Arousal/Alertness: Awake/alert Behavior During Therapy: WFL for tasks assessed/performed (pleasant/interactive) Overall Cognitive Status: Within Functional Limits for tasks assessed                      Exercises      General Comments        Pertinent Vitals/Pain Pain Assessment: Faces Faces Pain Scale: Hurts little more Pain Location: with activity.  Staes the pain starts in the low back and travel to groin Pain Descriptors / Indicators: Aching Pain Intervention(s): Monitored during session;Repositioned    Home Living                      Prior Function            PT Goals (current goals can now be found in the care plan section) Progress towards PT goals: Progressing toward goals    Frequency  Min 2X/week    PT Plan      Co-evaluation             End  of Session Equipment Utilized During Treatment: Gait belt Activity Tolerance: Patient tolerated treatment well Patient left: in chair;with call bell/phone within reach;with nursing/sitter in room     Time: 1400-1415 PT Time Calculation (min) (ACUTE ONLY): 15 min  Charges:  $Gait Training: 8-22 mins                    G Codes:      Rica Koyanagi  PTA [facility]  Acute  Rehab Pager      [phone]

## 2016-01-21 NOTE — Progress Notes (Signed)
Patient at med window asking for ativan prn. "I'm so agitated and shaky. I'm fixing to go off. I'm tired of the men controlling the remote and being made to watch sports. I'm going to tear this place up." When asked if patient felt steadier on her feet she indicated that she did not. Ambulating with walker and 1:1 MHT assist. Patient rates her depression, hopelessness and anxiety all at an 8/10. Reports her goal for today is to "get her back to ease off" and plans to do this by "getting an MRI." Patient informed her last ativan was at a [phone] and per orders, it is too soon to give another dose. Patient was medicated with her AM meds and patient encouraged to utilize coping skills as well as staff at bedside. Fall precautions reviewed and in place. Patient verbalized understanding. Patient rates her pain at a 9/10 but refuses any prn pain medicine. Denies SI/HI and remains safe on Level I obs. Jamie Kato

## 2016-01-21 NOTE — Progress Notes (Signed)
1:1 progress note:  At this time, pt is in the bed with eyes closed.  No distress observed.  Respirations even and unlabored.  She was up earlier walking with there walker.  She sat in the dayroom for a little while watching TV.  She said she was feeling anxious at the beginning of the shift and requested an Ativan which she received.  She hopes to be discharged in the next couple of days, and was told that if she is doing good tomorrow, she will come off the 1:1 tomorrow.  Discharge plans are in process.  Pt reports she intends to stay sober this time.  Support and encouragement offered.  Continue 1:1 for safety.  Sitter with pt.  Pt safe at this time.

## 2016-01-21 NOTE — Progress Notes (Signed)
[facility] MD Progress Note  01/21/2016 5:23 PM Erin [name]  MRN:  [id number] Subjective:  Erin [name] states that she is still having anxiety and pain. She is using the walker more and feels more confident. States this AM was having more mood swings and was afraid she was going to go off on people. Describes some restlessness (internal)  Principal Problem: Bipolar affective disorder, depressed, severe ([place]) Diagnosis:   Patient Active Problem List   Diagnosis Date Noted  . Alcohol dependence with alcohol-induced mood disorder ([place]) [F10.24]     Priority: High  . Bipolar affective disorder, depressed, severe ([place]) [F31.4] 02/06/2015    Priority: High  . Opioid dependence ([place]) [F11.20] 05/06/2014    Priority: High  . Cocaine dependence ([place]) [F14.20] 07/17/2013    Priority: High  . HYPOTHYROIDISM [E03.9] 03/02/2009    Priority: High  . Alcohol use disorder, severe, dependence ([place]) [F10.20] 01/12/2016  . Cocaine dependence with cocaine-induced mood disorder ([place]) [F14.24]   . Opioid dependence with withdrawal ([place]) [F11.23]   . Bipolar disorder ([place]) [F31.9] 02/09/2015  . Suicidal ideations [R45.851]   . Thrombocytopenia ([place]) [D69.6] 01/06/2013  . Sarcoidosis ([place]) [D86.9] 03/30/2010  . HYPERTENSION, BENIGN ESSENTIAL [I10] 01/11/2010  . ALCOHOLIC LIVER DISEASE [id number] 03/06/2008  . CIRRHOSIS, ALCOHOLIC, LIVER [id number]  . TOBACCO DEPENDENCE [F17.200] 02/22/2007  . EXTERNAL HEMORRHOIDS [K64.4] 01/11/2005  . PORTAL HYPERTENSION [K76.6] 01/11/2005   Total Time spent with patient: 30 minutes  Past Psychiatric History: see admission H and P  Past Medical History:  Past Medical History  Diagnosis Date  . Hypothyroidism   . Blood transfusion July 2012  . Depression   . Anxiety   . Bipolar 1 disorder ([place])   . Menorrhagia   . Liver failure ([place])   . Bronchitis   . Cirrhosis of liver ([place])   . Wegener's granulomatosis ([place])   . History of sarcoidosis     Past Surgical  History  Procedure Laterality Date  . Tubal ligation    . Laparoscopy    . Orthopedic surgery    . Cholecystectomy    . Fracture surgery    . Hernia repair    . Vaginal hysterectomy  07/27/2011    Procedure: HYSTERECTOMY VAGINAL;  Surgeon: [name], MD;  Location: [facility];  Service: Gynecology;  Laterality: N/A;  Vaginal Hysterectomy with Right Oophorectomy  . Laparotomy  07/28/2011    Procedure: EXPLORATORY LAPAROTOMY;  Surgeon: [name], MD;  Location: [facility];  Service: Gynecology;  Laterality: N/A;   Family History:  Family History  Problem Relation Age of Onset  . Diabetes Mother   . Hypertension Mother   . Diabetes Daughter   . Hypertension Daughter    Family Psychiatric  History: see Admission H and P Social History:  History  Alcohol Use  . Yes    Comment: a fifth of vodka and a 12 pk a day of beer      History  Drug Use  . Yes  . Special: Cocaine, Marijuana    Comment: opioids, benzos    Social History   Social History  . Marital Status: Divorced    Spouse Name: N/A  . Number of Children: N/A  . Years of Education: N/A   Social History Main Topics  . Smoking status: Current Some Day Smoker -- 0.25 packs/day  . Smokeless tobacco: Never Used  . Alcohol Use: Yes     Comment: a fifth of vodka and a 12 pk a day  of beer   . Drug Use: Yes    Special: Cocaine, Marijuana     Comment: opioids, benzos  . Sexual Activity: Yes    Birth Control/ Protection: None   Other Topics Concern  . None   Social History Narrative   Additional Social History:                         Sleep: Fair  Appetite:  Fair  Current Medications: Current Facility-Administered Medications  Medication Dose Route Frequency Provider Last Rate Last Dose  . albuterol (PROVENTIL HFA;VENTOLIN HFA) 108 (90 Base) MCG/ACT inhaler 1-2 puff  1-2 puff Inhalation Q6H PRN Delfin Gant, NP      . alum & mag hydroxide-simeth (MAALOX/MYLANTA) 200-200-20 MG/5ML suspension 30 mL   30 mL Oral PRN Delfin Gant, NP      . carisoprodol (SOMA) tablet 350 mg  350 mg Oral QID Nicholaus Bloom, MD   350 mg at 01/21/16 1201  . docusate sodium (COLACE) capsule 100 mg  100 mg Oral BID Delfin Gant, NP   100 mg at 01/21/16 0753  . DULoxetine (CYMBALTA) DR capsule 60 mg  60 mg Oral Daily Nicholaus Bloom, MD   60 mg at 01/21/16 0753  . furosemide (LASIX) tablet 20 mg  20 mg Oral Daily Delfin Gant, NP   20 mg at 01/21/16 0753  . gabapentin (NEURONTIN) capsule 300 mg  300 mg Oral TID Nicholaus Bloom, MD   300 mg at 01/21/16 1201  . gabapentin (NEURONTIN) capsule 300 mg  300 mg Oral QHS Nicholaus Bloom, MD   300 mg at 01/20/16 2115  . hydrochlorothiazide (HYDRODIURIL) tablet 25 mg  25 mg Oral Daily Delfin Gant, NP   25 mg at 01/21/16 0753  . ibuprofen (ADVIL,MOTRIN) tablet 800 mg  800 mg Oral Q8H PRN Laverle Hobby, PA-C   800 mg at 01/17/16 2106  . loratadine (CLARITIN) tablet 10 mg  10 mg Oral Daily Delfin Gant, NP   10 mg at 01/21/16 0753  . LORazepam (ATIVAN) tablet 1 mg  1 mg Oral Q6H PRN Nicholaus Bloom, MD   1 mg at 01/21/16 [id number]  . multivitamin with minerals tablet 1 tablet  1 tablet Oral Daily Nicholaus Bloom, MD   1 tablet at 01/21/16 0753  . ondansetron (ZOFRAN) tablet 4 mg  4 mg Oral Q8H PRN Delfin Gant, NP   4 mg at 01/12/16 2130  . propranolol (INDERAL) tablet 20 mg  20 mg Oral BID Delfin Gant, NP   20 mg at 01/21/16 0753  . QUEtiapine (SEROQUEL) tablet 200 mg  200 mg Oral QHS Nicholaus Bloom, MD   200 mg at 01/20/16 2115  . QUEtiapine (SEROQUEL) tablet 75 mg  75 mg Oral TID Nicholaus Bloom, MD   75 mg at 01/21/16 1201  . thiamine (VITAMIN B-1) tablet 100 mg  100 mg Oral Daily Nicholaus Bloom, MD   100 mg at 01/21/16 0753  . thyroid (ARMOUR) tablet 30 mg  30 mg Oral QAC breakfast Delfin Gant, NP   30 mg at 01/21/16 [id number]  . traZODone (DESYREL) tablet 50 mg  50 mg Oral QHS,MR X 1 Laverle Hobby, PA-C   50 mg at 01/20/16 2114    Lab  Results: No results found for this or any previous visit (from the past 48 hour(s)).  Physical Findings: AIMS: Facial and  Oral Movements Muscles of Facial Expression: None, normal Lips and Perioral Area: None, normal Jaw: None, normal Tongue: None, normal,Extremity Movements Upper (arms, wrists, hands, fingers): None, normal Lower (legs, knees, ankles, toes): None, normal, Trunk Movements Neck, shoulders, hips: None, normal, Overall Severity Severity of abnormal movements (highest score from questions above): None, normal Incapacitation due to abnormal movements: None, normal Patient's awareness of abnormal movements (rate only patient's report): No Awareness, Dental Status Current problems with teeth and/or dentures?: No Does patient usually wear dentures?: No  CIWA:  CIWA-Ar Total: 4 COWS:     Musculoskeletal: Strength & Muscle Tone: within normal limits Gait & Station: normal Patient leans: normal  Psychiatric Specialty Exam: Review of Systems  Constitutional: Negative.   HENT: Negative.   Eyes: Negative.   Respiratory: Negative.   Cardiovascular: Negative.   Gastrointestinal: Negative.   Genitourinary: Negative.   Musculoskeletal: Positive for back pain.  Skin: Negative.   Neurological: Negative.   Endo/Heme/Allergies: Negative.   Psychiatric/Behavioral: Positive for depression and substance abuse. The patient is nervous/anxious.     Blood pressure 102/74, pulse 71, temperature 98 F (36.7 C), temperature source Oral, resp. rate 16, height 5\' 7"  (1.702 m), weight 108.863 kg (240 lb).Body mass index is 37.58 kg/(m^2).  General Appearance: Fairly Groomed  Engineer, water::  Fair  Speech:  Clear and Coherent  Volume:  Normal  Mood:  Anxious and worried  Affect:  anxious worried  Thought Process:  Coherent and Goal Directed  Orientation:  Full (Time, Place, and Person)  Thought Content:  symptoms events worries concerns  Suicidal Thoughts:  No  Homicidal Thoughts:  No   Memory:  Immediate;   Fair Recent;   Fair Remote;   Fair  Judgement:  Fair  Insight:  Present and Shallow  Psychomotor Activity:  Restlessness  Concentration:  Fair  Recall:  [employer] of Knowledge:Fair  Language: Fair  Akathisia:  No  Handed:  Right  AIMS (if indicated):     Assets:  Desire for Improvement Housing  ADL's:  Intact  Cognition: WNL  Sleep:  Number of Hours: 6.75   Treatment Plan Summary: Daily contact with patient to assess and evaluate symptoms and progress in treatment and Medication management Supportive approach/coping skills Alcohol cocaine dependence; continue to work a relapse prevention plan Mood instability; will decrease the Seroquel back to 50 mg during the day as it could be causing akathisia Anxiety: will add Trazodone 25 mg to the Seroquel 50 Pain; will continue the Neurontin 300 mg/Soma QID Depression; continue the Cymbalta 60 mg daily Use CBT/mindfulness Ariza Evans A 01/21/2016, 5:23 PM

## 2016-01-21 NOTE — Progress Notes (Signed)
At this time, pt is resting in bed with eyes closed.  No distress observed.  Respirations even and unlabored.   Continue 1:1 for safety d/t unsteady gait.  Sitter at bedside.  Pt safe at this time.

## 2016-01-21 NOTE — BHH Group Notes (Signed)
[place] LCSW Group Therapy  01/21/2016 3:25 PM  Type of Therapy:  Group Therapy  Participation Level:  Active  Participation Quality:  Drowsy  Affect:  Flat  Cognitive:  Oriented  Insight:  Limited  Engagement in Therapy:  Limited  Modes of Intervention:  Confrontation, Discussion, Education, Exploration, Problem-solving, Rapport Building, Socialization and Support  Summary of Progress/Problems:  Finding Balance in Life. Today's group focused on defining balance in one's own words, identifying things that can knock one off balance, and exploring healthy ways to maintain balance in life. Group members were asked to provide an example of a time when they felt off balance, describe how they handled that situation,and process healthier ways to regain balance in the future. Group members were asked to share the most important tool for maintaining balance that they learned while at [facility] and how they plan to apply this method after discharge. Erin [name] was attentive during group but reports feeling drowsy and slurring her words. Pt states that she is interested in long term placement "possibly [facility]." CSW assessing. PT made aware that significant medical issues may interfere with this. She reports feeling off balance this afternoon due to unmanaged pain issues.   Smart, [name] LCSW 01/21/2016, 3:25 PM

## 2016-01-21 NOTE — Progress Notes (Signed)
Pt did not attend wrap-up group   

## 2016-01-21 NOTE — Plan of Care (Signed)
Problem: Alteration in mood Goal: STG-Patient reports thoughts of self-harm to staff Outcome: Progressing Patient denies thoughts to self harm, SI.  Problem: Diagnosis: Increased Risk For Suicide Attempt Goal: STG-Patient Will Comply With Medication Regime Outcome: Progressing Patient is med compliant.

## 2016-01-21 NOTE — Progress Notes (Signed)
Patient currently in afternoon social work group. Attentive. MHT at side. 1:1 obs remains in place for safety and patient is safe. Jamie Kato

## 2016-01-21 NOTE — Progress Notes (Addendum)
PHYSICAL THERAPY    01/21/16 1431  Berg Balance Test  Sit to Stand 3 difficult without use of hands  Standing Unsupported 4  Sitting with Back Unsupported but Feet Supported on Floor or Stool 4  Stand to Sit 3 difficult without use of hands (plop)  Transfers 3  Standing Unsupported with Eyes Closed 4 (wide BOS)  Standing Ubsupported with Feet Together 2   Narrow BOS pt had lateral LOB  From Standing, Reach Forward with Outstretched Arm 3 (c/o low back pain)  From Standing Position, Pick up Object from Floor 0 (back pain prohibits)  From Standing Position, Turn to Look Behind Over each Shoulder 2     partial twist due to back pain  Turn 360 Degrees 4  Standing Unsupported, Alternately Place Feet on Step/Stool 0  Attempted but too unsteady and LOB  Standing Unsupported, One Foot in Front 0 attempted however    Lost balance  Standing on One Leg 0  Unable to complete     Too unsteady  Total Score 32   Indicating HIGH FALL RISK Requires need of RW  Rica Koyanagi  PTA [facility]  Acute  Rehab Pager      [phone]

## 2016-01-21 NOTE — Progress Notes (Signed)
Pt is awake and sitting on the side of her bed.  She reports that she slept, but was having weird dreams of drinking and drugging with her friends.  She says she thinks that she was having the dreams because she has made up her mind that she is not going to do that anymore.  She says she is too old to keep up with that kind of life and says "I have to find new playgrounds and new playmates."  Pt voiced no complaints this morning.  She did request an prn Ativan as she is feeling somewhat anxious.  Pt continues on 1:1 for safety d/t high fall risk.  Sitter at bedside.  Pt remains safe.

## 2016-01-22 ENCOUNTER — Inpatient Hospital Stay (HOSPITAL_COMMUNITY): Payer: Medicare Other

## 2016-01-22 NOTE — Progress Notes (Signed)
`    1:1   D Pt is seen sitting in her wheel chair in the dayroom. The sitter is at her side. Pt is alert and oriented x4 and she is talking about " having my MRI done today".    A She completed her daily assessment and on it she wrote she denied SI and she rated her depression, hopelessness and anxiety " " 7/7/8", respectively. She remains unsteady on her feet, a HIGH fall risk and 1:1 in place.   R Safety maintained.

## 2016-01-22 NOTE — BHH Group Notes (Signed)
[facility] LCSW Aftercare Discharge Planning Group Note   01/22/2016 11:08 AM  Participation Quality:  Appropriate   Mood/Affect:  Appropriate  Depression Rating:  8  Anxiety Rating:  8  Thoughts of Suicide:  No Will you contract for safety?   NA  Current AVH:  No  Plan for Discharge/Comments:  Pt reports that she is in pain today and hoping to get MRI. Pt worried about her physical health. Reports fair sleep but "had dreams about drinking and being in a bar."   Transportation Means: unknown at this time.   Supports: son   Smart, Nira Conn LCSW

## 2016-01-22 NOTE — Progress Notes (Signed)
[place] [facility] 1:1 Observation Documentation  For the first (8) hours following discontinuation of 1:1 precautions, a progress note entry by nursing staff should be documented at least every 2 hours, reflecting the patient's behavior, condition, mood, and conversation.  Use the progress notes for additional entries.  Time 1:1 discontinued:  1800  Patient's Behavior:  Calm  Patient's Condition:  Stable  Patient's Conversation:  Appropriate  Lauralyn Primes 01/22/2016, 6:03 PM

## 2016-01-22 NOTE — Progress Notes (Signed)
Late entry for 1:1 at 1400 ; Erin [name] has been UAL, using her walker tolerated fair. She remains dizzy and slow, but better able to ambulate. Still complaining of dizziness. She moves VERY slowly.   A 1:1 contd. R Safety is in place.

## 2016-01-22 NOTE — Progress Notes (Signed)
Patient did not attend AA group tonight, was off unit at ED.

## 2016-01-22 NOTE — Progress Notes (Signed)
[name] is seen out in the hall, with sitter at her side,( she is sitting in her wheelchair). Dr. Sabra Heck is seen standing next to her and he says " I want to get rid of the 1:1". A This nurse asked Erin [name] if she felt strong enough to ambulate independently. She shakes her head up and down " yes" and says " You don't think Im gonna break my hip do you???". She is reminded to ambulate slowly, to hold her head up, to look up and to take her time. R Safety in place. Per Dr. Sabra Heck, MRI  Without scan is scheduled for this evening at [facility].

## 2016-01-22 NOTE — Progress Notes (Signed)
At this time, pt is in the dayroom sitting in the wheelchair watching TV.  She was awake shortly after 5 am.  The sitter took her to the tub room where she took a bath.  She voices no needs at this time.  Continue 1:1 for safety.  Sitter with patient.  Pt remains safe.

## 2016-01-22 NOTE — Tx Team (Signed)
Interdisciplinary Treatment Plan Update (Adult)  Date:  01/22/2016  Time Reviewed:  11:09 AM   Progress in Treatment: Attending groups: Yes Participating in groups:  Yes Taking medication as prescribed:  Yes. Tolerating medication:  Yes. Family/Significant othe contact made: SPE completed with pt, as she refused to consent to family contact.  Patient understands diagnosis:  Yes. and As evidenced by:  seeking treatment for alcohol abuse/opiate abuse, depression, SI, and for medication stabillization Discussing patient identified problems/goals with staff:  Yes. Medical problems stabilized or resolved:  Yes. Denies suicidal/homicidal ideation: Yes. Issues/concerns per patient self-inventory:  Other:  Discharge Plan or Barriers: Pt unsure if she will return home or if she wants to pursue treatment (possibly [facility]). Depending on pt's MRI results and if she has medical issues that require follow-up at d/c. CSW assessing.   Reason for Continuation of Hospitalization: Depression Medication stabilization Withdrawal symptoms  Comments:  Erin [name] is an 50 y.o. female with history of depression, anxiety, and Bipolar I Disorder. Writer met with patient face to face. Patient sts, "I want to go to [facility]", "I want to be on Dr. Baird Kay", and I please put me on the hall for mental health and not psychotic patient's". The patient has a history of significant alcohol abuse. Today she is requesting detox and wants treatment at [facility]. She states that she has been in alcohol detox multiple times in the past but in past several months she has started drinking again after 8 months of sobriety secondary to her "life stressors". Patient reports that multiple people in her family struggle with alcoholism. She has a brother whom she found dead in a hotel after suicide. Patient's age of first use is 50 yrs old. Her last drink was late last night going into the early part of this morning. She denies  suicidal thoughts or self, she states that she is not hallucinating, she does feel significantly depressed. Her depression is evidenced by increased crying spells and fatigue. Diagnosis: Alcohol Abuse, Anxiety Disorder NOS and Depressive Disorder NOS  Estimated length of stay:  2-5 days   Additional Comments:  Patient and CSW reviewed pt's identified goals and treatment plan. Patient verbalized understanding and agreed to treatment plan. CSW reviewed [facility] "Discharge Process and Patient Involvement" Form. Pt verbalized understanding of information provided and signed form.    Review of initial/current patient goals per problem list:  1. Goal(s): Patient will participate in aftercare plan  Met: No.   Target date: at discharge  As evidenced by: Patient will participate within aftercare plan AEB aftercare provider and housing plan at discharge being identified.  1/18: CSW assessing for appropriate referrals.   1/23: Pt plans to return home and follow-up with mental health or her PCP. CSW continuing to assess.   1/27: Continuing to assess.   2. Goal (s): Patient will exhibit decreased depressive symptoms and suicidal ideations.  Met: No.    Target date: at discharge  As evidenced by: Patient will utilize self rating of depression at 3 or below and demonstrate decreased signs of depression or be deemed stable for discharge by MD.  1/18: Pt rates depression as high. Passive SI at times/currently reports no SI. No AVH/HI.   1/23: Pt rates depression as 4/10 and presents with depressed mood/lethargic affect. Denies SI/HI/AVH.   1/27: Pt rates depression as 8/10 and presents with depressed mood/flat affect.   3. Goal(s): Patient will demonstrate decreased signs of withdrawal due to substance abuse  Met:No. Goal progressing.  Target date:at discharge   As evidenced by: Patient will produce a CIWA/COWS score of 0, have stable vitals signs, and no symptoms of  withdrawal.  1/18: Pt reports high withdrawals with current CIWA score of 11 and stable vitals.   1/23: Pt reports that withdrawals are lessoning with CIWA score of 4 and stable vitals. Goal progressing.   1/27: Pt continues to report mild withdrawals. No recent CIWA taken. Stable vitals. Goal progressing.   Attendees: Patient:   01/22/2016 11:09 AM   Family:   01/22/2016 11:09 AM   Physician:  Dr. Carlton Adam, MD 01/22/2016 11:09 AM   Nursing:   Lennon Alstrom RN  01/22/2016 11:09 AM   Clinical Social Worker: Maxie Better, LCSW 01/22/2016 11:09 AM   Clinical Social Worker: Erasmo Downer Drinkard LCSWA;  01/22/2016 11:09 AM   Other:  Gerline Legacy Nurse Case Manager 01/22/2016 11:09 AM   Other:  Agustina Caroli NP 01/22/2016 11:09 AM   Other:   01/22/2016 11:09 AM   Other:  01/22/2016 11:09 AM   Other:  01/22/2016 11:09 AM   Other:  01/22/2016 11:09 AM    01/22/2016 11:09 AM    01/22/2016 11:09 AM    01/22/2016 11:09 AM    01/22/2016 11:09 AM    Scribe for Treatment Team:   Maxie Better, LCSW 01/22/2016 11:09 AM

## 2016-01-22 NOTE — Progress Notes (Signed)
1:1 progress note:  Pt is resting in bed with eyes closed.  No distress observed.  Respirations even and unlabored.  Continue 1:1 for safety.  Sitter at bedside.  Pt safe at this time.

## 2016-01-22 NOTE — Progress Notes (Signed)
Patient ID: Erin [name], female   DOB: September 03, 1966, 50 y.o.   MRN: [id number] D: Client returns from MRI, reports machine was loud Client lying down relaxing. A: Writer provide emotional support. Review medication schedule, will notify client. Staff will continue to monitor q51min for safety. R: Client is safe on the unit.

## 2016-01-22 NOTE — Progress Notes (Signed)
[facility] MD Progress Note  01/22/2016 6:32 PM Erin [name]  MRN:  [id number] Subjective:  Erin [name] continues to have a hard time. Still feeling very anxious easily agitated. Still endorses persistent pain at times worst in her back and arm. Worried about her ability to stay sober as states that she does not know how she will be able to make it if she was to relapse. States part of her does not want to die Principal Problem: Bipolar affective disorder, depressed, severe ([place]) Diagnosis:   Patient Active Problem List   Diagnosis Date Noted  . Alcohol dependence with alcohol-induced mood disorder ([place]) [F10.24]     Priority: High  . Bipolar affective disorder, depressed, severe ([place]) [F31.4] 02/06/2015    Priority: High  . Opioid dependence ([place]) [F11.20] 05/06/2014    Priority: High  . Cocaine dependence ([place]) [F14.20] 07/17/2013    Priority: High  . HYPOTHYROIDISM [E03.9] 03/02/2009    Priority: High  . Alcohol use disorder, severe, dependence ([place]) [F10.20] 01/12/2016  . Cocaine dependence with cocaine-induced mood disorder ([place]) [F14.24]   . Opioid dependence with withdrawal ([place]) [F11.23]   . Bipolar disorder ([place]) [F31.9] 02/09/2015  . Suicidal ideations [R45.851]   . Thrombocytopenia ([place]) [D69.6] 01/06/2013  . Sarcoidosis ([place]) [D86.9] 03/30/2010  . HYPERTENSION, BENIGN ESSENTIAL [I10] 01/11/2010  . ALCOHOLIC LIVER DISEASE [id number] 03/06/2008  . CIRRHOSIS, ALCOHOLIC, LIVER [id number] 0000000  . TOBACCO DEPENDENCE [F17.200] 02/22/2007  . EXTERNAL HEMORRHOIDS [K64.4] 01/11/2005  . PORTAL HYPERTENSION [K76.6] 01/11/2005   Total Time spent with patient: 20 minutes  Past Psychiatric History: see admission H and P  Past Medical History:  Past Medical History  Diagnosis Date  . Hypothyroidism   . Blood transfusion July 2012  . Depression   . Anxiety   . Bipolar 1 disorder ([place])   . Menorrhagia   . Liver failure ([place])   . Bronchitis   . Cirrhosis of liver ([place])   .  Wegener's granulomatosis ([place])   . History of sarcoidosis     Past Surgical History  Procedure Laterality Date  . Tubal ligation    . Laparoscopy    . Orthopedic surgery    . Cholecystectomy    . Fracture surgery    . Hernia repair    . Vaginal hysterectomy  07/27/2011    Procedure: HYSTERECTOMY VAGINAL;  Surgeon: [name], MD;  Location: [facility];  Service: Gynecology;  Laterality: N/A;  Vaginal Hysterectomy with Right Oophorectomy  . Laparotomy  07/28/2011    Procedure: EXPLORATORY LAPAROTOMY;  Surgeon: [name], MD;  Location: [facility];  Service: Gynecology;  Laterality: N/A;   Family History:  Family History  Problem Relation Age of Onset  . Diabetes Mother   . Hypertension Mother   . Diabetes Daughter   . Hypertension Daughter    Family Psychiatric  History: see admission H and P Social History:  History  Alcohol Use  . Yes    Comment: a fifth of vodka and a 12 pk a day of beer      History  Drug Use  . Yes  . Special: Cocaine, Marijuana    Comment: opioids, benzos    Social History   Social History  . Marital Status: Divorced    Spouse Name: N/A  . Number of Children: N/A  . Years of Education: N/A   Social History Main Topics  . Smoking status: Current Some Day Smoker -- 0.25 packs/day  . Smokeless tobacco: Never Used  . Alcohol  Use: Yes     Comment: a fifth of vodka and a 12 pk a day of beer   . Drug Use: Yes    Special: Cocaine, Marijuana     Comment: opioids, benzos  . Sexual Activity: Yes    Birth Control/ Protection: None   Other Topics Concern  . None   Social History Narrative   Additional Social History:                         Sleep: Fair  Appetite:  Fair  Current Medications: Current Facility-Administered Medications  Medication Dose Route Frequency Provider Last Rate Last Dose  . albuterol (PROVENTIL HFA;VENTOLIN HFA) 108 (90 Base) MCG/ACT inhaler 1-2 puff  1-2 puff Inhalation Q6H PRN Delfin Gant, NP      .  alum & mag hydroxide-simeth (MAALOX/MYLANTA) 200-200-20 MG/5ML suspension 30 mL  30 mL Oral PRN Delfin Gant, NP      . carisoprodol (SOMA) tablet 350 mg  350 mg Oral QID Nicholaus Bloom, MD   350 mg at 01/22/16 1732  . docusate sodium (COLACE) capsule 100 mg  100 mg Oral BID Delfin Gant, NP   100 mg at 01/22/16 0831  . DULoxetine (CYMBALTA) DR capsule 60 mg  60 mg Oral Daily Nicholaus Bloom, MD   60 mg at 01/22/16 0830  . furosemide (LASIX) tablet 20 mg  20 mg Oral Daily Delfin Gant, NP   20 mg at 01/22/16 0830  . gabapentin (NEURONTIN) capsule 300 mg  300 mg Oral TID Nicholaus Bloom, MD   300 mg at 01/22/16 1716  . gabapentin (NEURONTIN) capsule 300 mg  300 mg Oral QHS Nicholaus Bloom, MD   300 mg at 01/21/16 2122  . hydrochlorothiazide (HYDRODIURIL) tablet 25 mg  25 mg Oral Daily Delfin Gant, NP   25 mg at 01/22/16 0831  . ibuprofen (ADVIL,MOTRIN) tablet 800 mg  800 mg Oral Q8H PRN Laverle Hobby, PA-C   800 mg at 01/17/16 2106  . loratadine (CLARITIN) tablet 10 mg  10 mg Oral Daily Delfin Gant, NP   10 mg at 01/22/16 0831  . LORazepam (ATIVAN) tablet 1 mg  1 mg Oral Q6H PRN Nicholaus Bloom, MD   1 mg at 01/22/16 [id number]  . multivitamin with minerals tablet 1 tablet  1 tablet Oral Daily Nicholaus Bloom, MD   1 tablet at 01/22/16 0830  . ondansetron (ZOFRAN) tablet 4 mg  4 mg Oral Q8H PRN Delfin Gant, NP   4 mg at 01/12/16 2130  . propranolol (INDERAL) tablet 20 mg  20 mg Oral BID Delfin Gant, NP   20 mg at 01/22/16 1716  . QUEtiapine (SEROQUEL) tablet 200 mg  200 mg Oral QHS Nicholaus Bloom, MD   200 mg at 01/21/16 2122  . QUEtiapine (SEROQUEL) tablet 50 mg  50 mg Oral TID Nicholaus Bloom, MD   50 mg at 01/22/16 1717  . thiamine (VITAMIN B-1) tablet 100 mg  100 mg Oral Daily Nicholaus Bloom, MD   100 mg at 01/22/16 0831  . thyroid (ARMOUR) tablet 30 mg  30 mg Oral QAC breakfast Delfin Gant, NP   30 mg at 01/22/16 [id number]  . traZODone (DESYREL) tablet 25 mg  25 mg  Oral TID Nicholaus Bloom, MD   25 mg at 01/22/16 1700  . traZODone (DESYREL) tablet 50 mg  50 mg  Oral QHS,MR X 1 Laverle Hobby, PA-C   50 mg at 01/22/16 1716    Lab Results: No results found for this or any previous visit (from the past 48 hour(s)).  Physical Findings: AIMS: Facial and Oral Movements Muscles of Facial Expression: None, normal Lips and Perioral Area: None, normal Jaw: None, normal Tongue: None, normal,Extremity Movements Upper (arms, wrists, hands, fingers): None, normal Lower (legs, knees, ankles, toes): None, normal, Trunk Movements Neck, shoulders, hips: None, normal, Overall Severity Severity of abnormal movements (highest score from questions above): None, normal Incapacitation due to abnormal movements: None, normal Patient's awareness of abnormal movements (rate only patient's report): No Awareness, Dental Status Current problems with teeth and/or dentures?: No Does patient usually wear dentures?: No  CIWA:  CIWA-Ar Total: 4 COWS:     Musculoskeletal: Strength & Muscle Tone: within normal limits Gait & Station: normal Patient leans: normal  Psychiatric Specialty Exam: Review of Systems  Constitutional: Positive for malaise/fatigue.  HENT: Negative.   Eyes: Negative.   Respiratory: Negative.   Cardiovascular: Negative.   Gastrointestinal: Negative.   Genitourinary: Negative.   Musculoskeletal: Positive for back pain and joint pain.  Skin: Negative.   Neurological: Positive for weakness.  Endo/Heme/Allergies: Negative.   Psychiatric/Behavioral: Positive for depression and substance abuse. The patient is nervous/anxious.     Blood pressure 95/64, pulse 81, temperature 98.2 F (36.8 C), temperature source Oral, resp. rate 16, height 5\' 7"  (1.702 m), weight 108.863 kg (240 lb).Body mass index is 37.58 kg/(m^2).  General Appearance: Fairly Groomed  Engineer, water::  Fair  Speech:  Clear and Coherent  Volume:  Decreased  Mood:  Anxious and Depressed   Affect:  sad anxious worried  Thought Process:  Coherent and Goal Directed  Orientation:  Full (Time, Place, and Person)  Thought Content:  symptoms events worries concerns  Suicidal Thoughts:  No  Homicidal Thoughts:  No  Memory:  Immediate;   Fair Recent;   Fair Remote;   Fair  Judgement:  Fair  Insight:  Present and Shallow  Psychomotor Activity:  Restlessness  Concentration:  Fair  Recall:  [employer] of Knowledge:Fair  Language: Fair  Akathisia:  No  Handed:  Right  AIMS (if indicated):     Assets:  Desire for Improvement Housing  ADL's:  Intact  Cognition: WNL  Sleep:  Number of Hours: 6   Treatment Plan Summary: Daily contact with patient to assess and evaluate symptoms and progress in treatment and Medication management Supportive approach/coping skills Alcohol/cocaine dependence; continue to work a relapse prevention plan Mood instability: continue the Seroquel 50/200 Anxiety; work with the Trazodone 25 along the Seroquel 50 Depression; continue the Cymbalta 60 mg Pain; continue Neurontin/Soma Will order a MRI of her back Sriya Kroeze A 01/22/2016, 6:32 PM

## 2016-01-23 MED ORDER — PREDNISONE 5 MG (21) PO TBPK
5.0000 mg | ORAL_TABLET | ORAL | Status: AC
  Administered 2016-01-23: 20 mg via ORAL

## 2016-01-23 MED ORDER — PREDNISONE 5 MG (21) PO TBPK
10.0000 mg | ORAL_TABLET | Freq: Every morning | ORAL | Status: AC
  Filled 2016-01-23: qty 21

## 2016-01-23 MED ORDER — PREDNISONE 5 MG (21) PO TBPK
5.0000 mg | ORAL_TABLET | ORAL | Status: AC
  Administered 2016-01-23: 5 mg via ORAL

## 2016-01-23 MED ORDER — PREDNISONE 5 MG (21) PO TBPK
10.0000 mg | ORAL_TABLET | Freq: Every evening | ORAL | Status: AC
  Administered 2016-01-24: 10 mg via ORAL

## 2016-01-23 MED ORDER — PREDNISONE 5 MG (21) PO TBPK
10.0000 mg | ORAL_TABLET | Freq: Every evening | ORAL | Status: AC
  Administered 2016-01-23: 10 mg via ORAL

## 2016-01-23 MED ORDER — IBUPROFEN 800 MG PO TABS
800.0000 mg | ORAL_TABLET | Freq: Four times a day (QID) | ORAL | Status: DC
  Administered 2016-01-23 – 2016-01-26 (×10): 800 mg via ORAL
  Filled 2016-01-23 (×15): qty 1

## 2016-01-23 MED ORDER — PREDNISONE 5 MG (21) PO TBPK
5.0000 mg | ORAL_TABLET | Freq: Four times a day (QID) | ORAL | Status: DC
  Administered 2016-01-25 – 2016-01-26 (×5): 5 mg via ORAL

## 2016-01-23 MED ORDER — PREDNISONE 5 MG (21) PO TBPK
5.0000 mg | ORAL_TABLET | Freq: Three times a day (TID) | ORAL | Status: AC
  Administered 2016-01-24 (×3): 5 mg via ORAL

## 2016-01-23 NOTE — Progress Notes (Signed)
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 States the pain is persistent. Admits that the pain keeps her anxious irritated Principal Problem: Bipolar affective disorder, depressed, severe ([place]) Diagnosis:   Patient Active Problem List   Diagnosis Date Noted  . Alcohol dependence with alcohol-induced mood disorder ([place]) [F10.24]     Priority: High  . Bipolar affective disorder, depressed, severe ([place]) [F31.4] 02/06/2015    Priority: High  . Opioid dependence ([place]) [F11.20] 05/06/2014    Priority: High  . Cocaine dependence ([place]) [F14.20] 07/17/2013    Priority: High  . HYPOTHYROIDISM [E03.9] 03/02/2009    Priority: High  . Alcohol use disorder, severe, dependence ([place]) [F10.20] 01/12/2016  . Cocaine dependence with cocaine-induced mood disorder ([place]) [F14.24]   . Opioid dependence with withdrawal ([place]) [F11.23]   . Bipolar disorder ([place]) [F31.9] 02/09/2015  . Suicidal ideations [R45.851]   . Thrombocytopenia ([place]) [D69.6] 01/06/2013  . Sarcoidosis ([place]) [D86.9] 03/30/2010  . HYPERTENSION, BENIGN ESSENTIAL [I10] 01/11/2010  . ALCOHOLIC LIVER DISEASE [id number] 03/06/2008  . CIRRHOSIS, ALCOHOLIC, LIVER [id number] 0000000  . TOBACCO DEPENDENCE [F17.200] 02/22/2007  . EXTERNAL HEMORRHOIDS [K64.4] 01/11/2005  . PORTAL HYPERTENSION [K76.6] 01/11/2005   Total Time spent with patient: 20 minutes  Past Psychiatric History: see admission H and P  Past Medical History:  Past Medical History  Diagnosis Date  . Hypothyroidism   . Blood transfusion July 2012  . Depression   . Anxiety   . Bipolar 1 disorder ([place])   . Menorrhagia   . Liver failure ([place])   . Bronchitis   . Cirrhosis of liver ([place])   . Wegener's granulomatosis ([place])   . History of sarcoidosis     Past Surgical History  Procedure Laterality Date  . Tubal ligation    . Laparoscopy    . Orthopedic surgery    .  Cholecystectomy    . Fracture surgery    . Hernia repair    . Vaginal hysterectomy  07/27/2011    Procedure: HYSTERECTOMY VAGINAL;  Surgeon: [name], MD;  Location: [facility];  Service: Gynecology;  Laterality: N/A;  Vaginal Hysterectomy with Right Oophorectomy  . Laparotomy  07/28/2011    Procedure: EXPLORATORY LAPAROTOMY;  Surgeon: [name], MD;  Location: [facility];  Service: Gynecology;  Laterality: N/A;   Family History:  Family History  Problem Relation Age of Onset  . Diabetes Mother   . Hypertension Mother   . Diabetes Daughter   . Hypertension Daughter    Family Psychiatric  History: see admission H and P Social History:  History  Alcohol Use  . Yes    Comment: a fifth of vodka and a 12 pk a day of beer      History  Drug Use  . Yes  . Special: Cocaine, Marijuana    Comment: opioids, benzos    Social History   Social History  . Marital Status: Divorced    Spouse Name: N/A  . Number of Children: N/A  . Years of Education: N/A   Social History Main Topics  . Smoking status: Current Some Day Smoker -- 0.25 packs/day  . Smokeless tobacco: Never Used  . Alcohol Use: Yes     Comment: a fifth of vodka and a 12 pk a day of beer   . Drug Use: Yes    Special: Cocaine, Marijuana     Comment: opioids, benzos  . Sexual  Activity: Yes    Birth Control/ Protection: None   Other Topics Concern  . None   Social History Narrative   Additional Social History:                         Sleep: Fair  Appetite:  Fair  Current Medications: Current Facility-Administered Medications  Medication Dose Route Frequency Provider Last Rate Last Dose  . albuterol (PROVENTIL HFA;VENTOLIN HFA) 108 (90 Base) MCG/ACT inhaler 1-2 puff  1-2 puff Inhalation Q6H PRN Delfin Gant, NP      . alum & mag hydroxide-simeth (MAALOX/MYLANTA) 200-200-20 MG/5ML suspension 30 mL  30 mL Oral PRN Delfin Gant, NP      . carisoprodol (SOMA) tablet 350 mg  350 mg Oral QID  Nicholaus Bloom, MD   350 mg at 01/23/16 1713  . docusate sodium (COLACE) capsule 100 mg  100 mg Oral BID Delfin Gant, NP   100 mg at 01/23/16 1713  . DULoxetine (CYMBALTA) DR capsule 60 mg  60 mg Oral Daily Nicholaus Bloom, MD   60 mg at 01/23/16 0745  . furosemide (LASIX) tablet 20 mg  20 mg Oral Daily Delfin Gant, NP   20 mg at 01/23/16 0745  . gabapentin (NEURONTIN) capsule 300 mg  300 mg Oral TID Nicholaus Bloom, MD   300 mg at 01/23/16 1712  . gabapentin (NEURONTIN) capsule 300 mg  300 mg Oral QHS Nicholaus Bloom, MD   300 mg at 01/22/16 2132  . hydrochlorothiazide (HYDRODIURIL) tablet 25 mg  25 mg Oral Daily Delfin Gant, NP   25 mg at 01/23/16 0745  . ibuprofen (ADVIL,MOTRIN) tablet 800 mg  800 mg Oral QID Nicholaus Bloom, MD      . loratadine (CLARITIN) tablet 10 mg  10 mg Oral Daily Delfin Gant, NP   10 mg at 01/23/16 0746  . LORazepam (ATIVAN) tablet 1 mg  1 mg Oral Q6H PRN Nicholaus Bloom, MD   1 mg at 01/23/16 [phone]  . multivitamin with minerals tablet 1 tablet  1 tablet Oral Daily Nicholaus Bloom, MD   1 tablet at 01/23/16 0746  . ondansetron (ZOFRAN) tablet 4 mg  4 mg Oral Q8H PRN Delfin Gant, NP   4 mg at 01/12/16 2130  . predniSONE (STERAPRED UNI-PAK 21 TAB) tablet 10 mg  10 mg Oral AC breakfast Nicholaus Bloom, MD   10 mg at 01/23/16 1415  . predniSONE (STERAPRED UNI-PAK 21 TAB) tablet 10 mg  10 mg Oral Nightly Nicholaus Bloom, MD      . Derrill Memo ON 01/24/2016] predniSONE (STERAPRED UNI-PAK 21 TAB) tablet 10 mg  10 mg Oral Nightly Nicholaus Bloom, MD      . Derrill Memo ON 01/24/2016] predniSONE (STERAPRED UNI-PAK 21 TAB) tablet 5 mg  5 mg Oral 3 x daily with food Nicholaus Bloom, MD      . Derrill Memo ON 01/25/2016] predniSONE (STERAPRED UNI-PAK 21 TAB) tablet 5 mg  5 mg Oral 4X daily taper Nicholaus Bloom, MD      . propranolol (INDERAL) tablet 20 mg  20 mg Oral BID Delfin Gant, NP   20 mg at 01/23/16 1713  . QUEtiapine (SEROQUEL) tablet 200 mg  200 mg Oral QHS Nicholaus Bloom, MD    200 mg at 01/22/16 2132  . QUEtiapine (SEROQUEL) tablet 50 mg  50 mg Oral TID Nicholaus Bloom,  MD   50 mg at 01/23/16 1712  . thiamine (VITAMIN B-1) tablet 100 mg  100 mg Oral Daily Nicholaus Bloom, MD   100 mg at 01/23/16 0745  . thyroid (ARMOUR) tablet 30 mg  30 mg Oral QAC breakfast Delfin Gant, NP   30 mg at 01/23/16 0634  . traZODone (DESYREL) tablet 25 mg  25 mg Oral TID Nicholaus Bloom, MD   25 mg at 01/23/16 1713  . traZODone (DESYREL) tablet 50 mg  50 mg Oral QHS,MR X 1 Laverle Hobby, PA-C   50 mg at 01/22/16 2134    Lab Results: No results found for this or any previous visit (from the past 48 hour(s)).  Physical Findings: AIMS: Facial and Oral Movements Muscles of Facial Expression: None, normal Lips and Perioral Area: None, normal Jaw: None, normal Tongue: None, normal,Extremity Movements Upper (arms, wrists, hands, fingers): None, normal Lower (legs, knees, ankles, toes): None, normal, Trunk Movements Neck, shoulders, hips: None, normal, Overall Severity Severity of abnormal movements (highest score from questions above): None, normal Incapacitation due to abnormal movements: None, normal Patient's awareness of abnormal movements (rate only patient's report): No Awareness, Dental Status Current problems with teeth and/or dentures?: No Does patient usually wear dentures?: No  CIWA:  CIWA-Ar Total: 4 COWS:     Musculoskeletal: Strength & Muscle Tone: within normal limits Gait & Station: uses walker Patient leans: normal  Psychiatric Specialty Exam: Review of Systems  Constitutional: Negative.   HENT: Negative.   Eyes: Negative.   Respiratory: Negative.   Cardiovascular: Negative.   Gastrointestinal: Negative.   Genitourinary: Negative.   Musculoskeletal: Positive for back pain.  Skin: Negative.   Neurological: Negative.   Endo/Heme/Allergies: Negative.   Psychiatric/Behavioral: Positive for substance abuse. The patient is nervous/anxious.     Blood  pressure 95/61, pulse 71, temperature 97.4 F (36.3 C), temperature source Oral, resp. rate 16, height 5\' 7"  (1.702 m), weight 108.863 kg (240 lb).Body mass index is 37.58 kg/(m^2).  General Appearance: Fairly Groomed  Engineer, water::  Fair  Speech:  Clear and Coherent  Volume:  Normal  Mood:  Anxious and in pain  Affect:  anxious worried in pain  Thought Process:  Coherent and Goal Directed  Orientation:  Full (Time, Place, and Person)  Thought Content:  symptoms events worries concerns  Suicidal Thoughts:  No  Homicidal Thoughts:  No  Memory:  Immediate;   Fair Recent;   Fair Remote;   Fair  Judgement:  Fair  Insight:  Present and Shallow  Psychomotor Activity:  Restlessness  Concentration:  Fair  Recall:  [employer] of Knowledge:Fair  Language: Fair  Akathisia:  No  Handed:  Right  AIMS (if indicated):     Assets:  Desire for Improvement Housing  ADL's:  Intact  Cognition: WNL  Sleep:  Number of Hours: 6.5   Treatment Plan Summary: Daily contact with patient to assess and evaluate symptoms and progress in treatment and Medication management Supportive approach/coping skills Substance dependence; continue to work a relapse prevention plan Mood instability; continue to work with the Seroquel Depression; continue to work with the Cymbalta Pain; will start a steroid taper monitoring any mood changes triggered by the steroids Use CBT/mindfulness [place] Beirne A 01/23/2016, 6:35 PM

## 2016-01-23 NOTE — Progress Notes (Signed)
Erin [name] remains a high fall risk., she uses her walker to ambulate ( and does so very slowly) and she still remains a HIGH FALL RISK. SHe completes her daily assessment and on it she wrote she deneid SI and she rated her depression , hopelessness and and anxiety " 7/7/7/", respectively.   A She attends her groups. She is started on prednisone today, for c/o back and leg pain ( confirmed by yesterday's MRI).    R Safety is in place.

## 2016-01-23 NOTE — BHH Group Notes (Signed)
[facility] Group Notes:  (Clinical Social Work)   10/24/2015     10:00-11:00AM  Summary of Progress/Problems:   In today's process group, a list of needs was created on the white board and there was a discussion about how the group members' choice to use drugs and/or alcohol in some way meets a need initially.  The group then centered on creating a plan to make healthier choices to get those needs met.  The patient expressed that she uses drugs and alcohol to sleep, to forget, because it is what she knows, and she stated that she loves the feeling.  She said that she feels she is a better person when she drinks, although the people around her say she acts the same.  She ultimately came to the conclusion during group that everybody around her does everything for her so she has no responsibilities and needs some.  Type of Therapy:  Group Therapy - Process   Participation Level:  Active  Participation Quality:  Attentive and Sharing  Affect:  Blunted  Cognitive:  Appropriate  Insight:  Good  Engagement in Therapy:  Engaged  Modes of Intervention:  Education, Motivational Interviewing  Selmer Dominion, LCSW 01/23/2016, 12:23 PM

## 2016-01-23 NOTE — Progress Notes (Signed)
Patient did not attend the evening speaker AA meeting. Pt was notified that group was beginning but returned to her room.   

## 2016-01-23 NOTE — BHH Group Notes (Signed)
[facility] Group Notes:  (Nursing/MHT/Case Management/Adjunct)  Date:  01/23/2016  Time:  1000   Type of Therapy:  Nurse Education: The group is focused on teaching patients how to identify healthy coping skills as well as identify skills needed in future .Marland Kitchen To help them deal with adversity.   Participation Level: Fair   Participation Quality:  Fair  Affect:  Flat  Cognitive:  Intact   Insight:  Minimal  Engagement in Group:  Engaged  Modes of Intervention:  Verbal communication and de escalation  Summary of Progress/Problems:  Lauralyn Primes 01/23/2016, 4:35 PM

## 2016-01-23 NOTE — Progress Notes (Signed)
Patient ID: Erin [name], female   DOB: July 06, 1966, 50 y.o.   MRN: [id number] D: Client has visitor this evening, interacting appropriately and smiling.  Client reports pain "8" of 10, depression "8" of 10, and anxiety "6" of 10. A: Writer provided emotional support, encouraged group. Staff will monitor q56min for safety. R: Client is safe on the  Unit, did not attend group. "I don't like that group, I like the group I go to outside of here"

## 2016-01-24 NOTE — Progress Notes (Signed)
[facility] MD Progress Note  01/24/2016 9:26 PM Erin [name]  MRN:  [id number] Subjective:  Erin [name] continues to face on her pain. She describes minimum relief from the steroids so far. Has not seen any worsening of her mood. States she might be more clear headed. Still concerned about the pain Principal Problem: Bipolar affective disorder, depressed, severe ([place]) Diagnosis:   Patient Active Problem List   Diagnosis Date Noted  . Alcohol dependence with alcohol-induced mood disorder ([place]) [F10.24]     Priority: High  . Bipolar affective disorder, depressed, severe ([place]) [F31.4] 02/06/2015    Priority: High  . Opioid dependence ([place]) [F11.20] 05/06/2014    Priority: High  . Cocaine dependence ([place]) [F14.20] 07/17/2013    Priority: High  . HYPOTHYROIDISM [E03.9] 03/02/2009    Priority: High  . Alcohol use disorder, severe, dependence ([place]) [F10.20] 01/12/2016  . Cocaine dependence with cocaine-induced mood disorder ([place]) [F14.24]   . Opioid dependence with withdrawal ([place]) [F11.23]   . Bipolar disorder ([place]) [F31.9] 02/09/2015  . Suicidal ideations [R45.851]   . Thrombocytopenia ([place]) [D69.6] 01/06/2013  . Sarcoidosis ([place]) [D86.9] 03/30/2010  . HYPERTENSION, BENIGN ESSENTIAL [I10] 01/11/2010  . ALCOHOLIC LIVER DISEASE [id number] 03/06/2008  . CIRRHOSIS, ALCOHOLIC, LIVER [id number] 0000000  . TOBACCO DEPENDENCE [F17.200] 02/22/2007  . EXTERNAL HEMORRHOIDS [K64.4] 01/11/2005  . PORTAL HYPERTENSION [K76.6] 01/11/2005   Total Time spent with patient: 20 minutes  Past Psychiatric History: see admission H and P  Past Medical History:  Past Medical History  Diagnosis Date  . Hypothyroidism   . Blood transfusion July 2012  . Depression   . Anxiety   . Bipolar 1 disorder ([place])   . Menorrhagia   . Liver failure ([place])   . Bronchitis   . Cirrhosis of liver ([place])   . Wegener's granulomatosis ([place])   . History of sarcoidosis     Past Surgical History  Procedure Laterality Date  .  Tubal ligation    . Laparoscopy    . Orthopedic surgery    . Cholecystectomy    . Fracture surgery    . Hernia repair    . Vaginal hysterectomy  07/27/2011    Procedure: HYSTERECTOMY VAGINAL;  Surgeon: [name], MD;  Location: [facility];  Service: Gynecology;  Laterality: N/A;  Vaginal Hysterectomy with Right Oophorectomy  . Laparotomy  07/28/2011    Procedure: EXPLORATORY LAPAROTOMY;  Surgeon: [name], MD;  Location: [facility];  Service: Gynecology;  Laterality: N/A;   Family History:  Family History  Problem Relation Age of Onset  . Diabetes Mother   . Hypertension Mother   . Diabetes Daughter   . Hypertension Daughter    Family Psychiatric  History: see admission H and P Social History:  History  Alcohol Use  . Yes    Comment: a fifth of vodka and a 12 pk a day of beer      History  Drug Use  . Yes  . Special: Cocaine, Marijuana    Comment: opioids, benzos    Social History   Social History  . Marital Status: Divorced    Spouse Name: N/A  . Number of Children: N/A  . Years of Education: N/A   Social History Main Topics  . Smoking status: Current Some Day Smoker -- 0.25 packs/day  . Smokeless tobacco: Never Used  . Alcohol Use: Yes     Comment: a fifth of vodka and a 12 pk a day of beer   . Drug Use: Yes  Special: Cocaine, Marijuana     Comment: opioids, benzos  . Sexual Activity: Yes    Birth Control/ Protection: None   Other Topics Concern  . None   Social History Narrative   Additional Social History:                         Sleep: Fair  Appetite:  Fair  Current Medications: Current Facility-Administered Medications  Medication Dose Route Frequency Provider Last Rate Last Dose  . albuterol (PROVENTIL HFA;VENTOLIN HFA) 108 (90 Base) MCG/ACT inhaler 1-2 puff  1-2 puff Inhalation Q6H PRN Delfin Gant, NP      . alum & mag hydroxide-simeth (MAALOX/MYLANTA) 200-200-20 MG/5ML suspension 30 mL  30 mL Oral PRN Delfin Gant, NP       . carisoprodol (SOMA) tablet 350 mg  350 mg Oral QID Nicholaus Bloom, MD   350 mg at 01/24/16 2101  . docusate sodium (COLACE) capsule 100 mg  100 mg Oral BID Delfin Gant, NP   100 mg at 01/24/16 1725  . DULoxetine (CYMBALTA) DR capsule 60 mg  60 mg Oral Daily Nicholaus Bloom, MD   60 mg at 01/24/16 0809  . furosemide (LASIX) tablet 20 mg  20 mg Oral Daily Delfin Gant, NP   20 mg at 01/24/16 0810  . gabapentin (NEURONTIN) capsule 300 mg  300 mg Oral TID Nicholaus Bloom, MD   300 mg at 01/24/16 1726  . gabapentin (NEURONTIN) capsule 300 mg  300 mg Oral QHS Nicholaus Bloom, MD   300 mg at 01/24/16 2101  . hydrochlorothiazide (HYDRODIURIL) tablet 25 mg  25 mg Oral Daily Delfin Gant, NP   25 mg at 01/24/16 0810  . ibuprofen (ADVIL,MOTRIN) tablet 800 mg  800 mg Oral QID Nicholaus Bloom, MD   800 mg at 01/24/16 2101  . loratadine (CLARITIN) tablet 10 mg  10 mg Oral Daily Delfin Gant, NP   10 mg at 01/24/16 0811  . LORazepam (ATIVAN) tablet 1 mg  1 mg Oral Q6H PRN Nicholaus Bloom, MD   1 mg at 01/24/16 1527  . multivitamin with minerals tablet 1 tablet  1 tablet Oral Daily Nicholaus Bloom, MD   1 tablet at 01/24/16 0810  . ondansetron (ZOFRAN) tablet 4 mg  4 mg Oral Q8H PRN Delfin Gant, NP   4 mg at 01/12/16 2130  . [START ON 01/25/2016] predniSONE (STERAPRED UNI-PAK 21 TAB) tablet 5 mg  5 mg Oral 4X daily taper Nicholaus Bloom, MD      . propranolol (INDERAL) tablet 20 mg  20 mg Oral BID Delfin Gant, NP   20 mg at 01/24/16 1726  . QUEtiapine (SEROQUEL) tablet 200 mg  200 mg Oral QHS Nicholaus Bloom, MD   200 mg at 01/24/16 2101  . QUEtiapine (SEROQUEL) tablet 50 mg  50 mg Oral TID Nicholaus Bloom, MD   50 mg at 01/24/16 1725  . thiamine (VITAMIN B-1) tablet 100 mg  100 mg Oral Daily Nicholaus Bloom, MD   100 mg at 01/24/16 [id number]  . thyroid (ARMOUR) tablet 30 mg  30 mg Oral QAC breakfast Delfin Gant, NP   30 mg at 01/24/16 [id number]  . traZODone (DESYREL) tablet 25 mg  25 mg Oral  TID Nicholaus Bloom, MD   25 mg at 01/24/16 1725  . traZODone (DESYREL) tablet 50 mg  50 mg Oral  QHS,MR X 1 Laverle Hobby, PA-C   50 mg at 01/24/16 2101    Lab Results: No results found for this or any previous visit (from the past 48 hour(s)).  Physical Findings: AIMS: Facial and Oral Movements Muscles of Facial Expression: None, normal Lips and Perioral Area: None, normal Jaw: None, normal Tongue: None, normal,Extremity Movements Upper (arms, wrists, hands, fingers): None, normal Lower (legs, knees, ankles, toes): None, normal, Trunk Movements Neck, shoulders, hips: None, normal, Overall Severity Severity of abnormal movements (highest score from questions above): None, normal Incapacitation due to abnormal movements: None, normal Patient's awareness of abnormal movements (rate only patient's report): No Awareness, Dental Status Current problems with teeth and/or dentures?: No Does patient usually wear dentures?: No  CIWA:  CIWA-Ar Total: 4 COWS:     Musculoskeletal: Strength & Muscle Tone: within normal limits Gait & Station: normal use walker Patient leans: normal  Psychiatric Specialty Exam: Review of Systems  Constitutional: Positive for malaise/fatigue.  HENT: Negative.   Eyes: Negative.   Respiratory: Negative.   Cardiovascular: Negative.   Gastrointestinal: Negative.   Genitourinary: Negative.   Musculoskeletal: Positive for back pain.  Skin: Negative.   Neurological: Positive for weakness.  Endo/Heme/Allergies: Negative.   Psychiatric/Behavioral: Positive for depression and substance abuse. The patient is nervous/anxious.     Blood pressure 108/70, pulse 75, temperature 97.9 F (36.6 C), temperature source Oral, resp. rate 20, height 5\' 7"  (1.702 m), weight 108.863 kg (240 lb).Body mass index is 37.58 kg/(m^2).  General Appearance: Fairly Groomed  Engineer, water::  Fair  Speech:  Clear and Coherent  Volume:  Decreased  Mood:  Anxious and in pain  Affect:   Restricted  Thought Process:  Coherent and Goal Directed  Orientation:  Full (Time, Place, and Person)  Thought Content:  symptoms events worries concerns  Suicidal Thoughts:  No  Homicidal Thoughts:  No  Memory:  Immediate;   Fair Recent;   Fair Remote;   Fair  Judgement:  Fair  Insight:  Present and Shallow  Psychomotor Activity:  Restlessness  Concentration:  Fair  Recall:  [employer] of Knowledge:Fair  Language: Fair  Akathisia:  No  Handed:  Right  AIMS (if indicated):     Assets:  Desire for Improvement Housing  ADL's:  Intact  Cognition: WNL  Sleep:  Number of Hours: 6   Treatment Plan Summary: Daily contact with patient to assess and evaluate symptoms and progress in treatment and Medication management Supportive approach/coping skills Substance abuse; continue to work a relapse prevention plan Depression; continue to work with the Cymbalta 60 mg Mood instability; continue to work with the Seroquel  Pain; continue to work with the Neurontin/Soma/Motrin combination Discs disease; continue the steroids taper Work with CBT/mindfulness Jaeline Whobrey A 01/24/2016, 9:26 PM

## 2016-01-24 NOTE — Final Progress Note (Addendum)
Erin [name] is seen OOB UAL on the 300 hall and she tolerates this well. She remains a HIGH fall risk and is consistently using her walker to ambulate with.  She takes her scheduled meds as planned. She is attending her groups, she is talking ( a lot) about her back and leg pain and how she " needs something stronger". She completed her daily  Assessment and on it she wrote she denied SI today and she rated her depression, hopelessness and anxiety " 7/7/8", respectively. A She is currently in her morning group with the SW. R Safety in place.

## 2016-01-24 NOTE — Plan of Care (Signed)
Problem: Alteration in mood & ability to function due to Goal: STG-Patient will comply with prescribed medication regimen (Patient will comply with prescribed medication regimen)  Outcome: Progressing Client is compliant with medications AEB taking medications without incidence no side effects reported.

## 2016-01-24 NOTE — BHH Group Notes (Signed)
[facility] Group Notes:  (Clinical Social Work)  01/24/2016  10:00-11:00AM  Summary of Progress/Problems:   The main focus of today's process group was to   1)  discuss the various healthy and unhealthy supports that could potentially help meet the life needs identified in yesterday's group  2)  define healthy supports versus unhealthy supports  3)  identify potential methods to handle unhealthy supports and  4)  generate ideas on how patients can go about adding healthy supports in their lives  The patient expressed full comprehension of the concepts presented, and agreed that healthy supports can help in recovery while unhealthy supports contribute to lack of success.  The patient participated heavily in the group and allowed other group members to brainstorm ways for her to get her need for purpose and responsibility met.  She showed good self knowledge by stating she would very much enjoy and benefit from volunteering with the elderly in some fashion through home visits, for instance, but could not possible work with children as that could threaten her sobriety.  Type of Therapy:  Process Group with Motivational Interviewing  Participation Level:  Active  Participation Quality:  Attentive and Sharing, Monopolizing but Redirectable  Affect:  Blunted  Cognitive:  Appropriate  Insight:  Engaged  Engagement in Therapy:  Engaged  Modes of Intervention:   Education, Psychiatric nurse, Activity  Selmer Dominion, LCSW 01/24/2016

## 2016-01-24 NOTE — BHH Group Notes (Signed)
[facility] Group Notes:  (Nursing/MHT/Case Management/Adjunct)  Date:  01/24/2016  Time:  1400  Type of Therapy:  Nurse Education  /  Healthy Support Systems: The group focuses on teaching patients how to develop healthy support systems.  Participation Level:  Active  Participation Quality:  Appropriate  Affect:  Appropriate  Cognitive:  Appropriate  Insight:  Appropriate  Engagement in Group:  Engaged  Modes of Intervention:  Education  Summary of Progress/Problems:  Lauralyn Primes 01/24/2016, 3:44 PM

## 2016-01-24 NOTE — Progress Notes (Signed)
Patient did not attend the evening speaker AA meeting. Pt was notified that group was beginning but remained in her room.   

## 2016-01-25 NOTE — BHH Group Notes (Signed)
[facility] LCSW Aftercare Discharge Planning Group Note   01/25/2016 10:43 AM  Participation Quality:  Appropriate   Mood/Affect:  Appropriate  Depression Rating:  8  Anxiety Rating:  7  Thoughts of Suicide:  No Will you contract for safety?   NA  Current AVH:  No  Plan for Discharge/Comments:  Pt reports that she is in pain due to back problems. Pt receptive to idea of getting an ACT team. "My son is also helping me buy a truck or a car when I leave here." Pt plans to return home at d/c. She is aware that tentative d/c date is scheduled for Wednesday.   Transportation Means: bus pass or her son   Supports: son   Proofreader, Nira Conn LCSW

## 2016-01-25 NOTE — Progress Notes (Signed)
Adult Psychoeducational Group Note  Date:  01/25/2016 Time:  9:26 PM  Group Topic/Focus:  Wrap-Up Group:   The focus of this group is to help patients review their daily goal of treatment and discuss progress on daily workbooks.  Participation Level:  Active  Participation Quality:  Attentive  Affect:  Appropriate  Cognitive:  Appropriate  Insight: Appropriate and Good  Engagement in Group:  Engaged  Modes of Intervention:  Discussion  Additional Comments:  Pt rated her day a 8 out of 31. Pt goal is to go home tomorrow and get the medication she needs for her back [name].   Erin [name] 01/25/2016, 9:26 PM

## 2016-01-25 NOTE — Progress Notes (Signed)
Patient ID: Erin [name], female   DOB: Feb 16, 1966, 50 y.o.   MRN: [id number] PER STATE REGULATIONS 482.30  THIS CHART WAS REVIEWED FOR MEDICAL NECESSITY WITH RESPECT TO THE PATIENT'S ADMISSION/ DURATION OF STAY.  NEXT REVIEW DATE: 01/28/2016  Chauncy Lean, RN, BSN CASE MANAGER

## 2016-01-25 NOTE — Plan of Care (Signed)
Problem: Alteration in mood & ability to function due to Goal: STG-Patient will report withdrawal symptoms Outcome: Completed/Met Date Met:  01/25/16 Patient no longer experiencing withdrawal symptoms.  Problem: Diagnosis: Increased Risk For Suicide Attempt Goal: STG-Patient Will Report Suicidal Feelings to Staff Outcome: Progressing Patient denies SI.

## 2016-01-25 NOTE — BHH Group Notes (Signed)
[facility] LCSW Group Therapy  01/25/2016 3:31 PM  Type of Therapy:  Group Therapy  Participation Level:  Active  Participation Quality:  Attentive  Affect:  Appropriate  Cognitive:  Alert and Oriented  Insight:  Improving  Engagement in Therapy:  Improving  Modes of Intervention:  Confrontation, Discussion, Education, Exploration, Problem-solving, Rapport Building, Socialization and Support  Summary of Progress/Problems: Today's Topic: Overcoming Obstacles. Patients identified one short term goal and potential obstacles in reaching this goal. Patients processed barriers involved in overcoming these obstacles. Patients identified steps necessary for overcoming these obstacles and explored motivation (internal and external) for facing these difficulties head on. Erin [name] was attentive and engaged during today's processing group. She shared that her biggest obstacle is "loneliness." She shared that her son is buying her a car "so I can get myself out of the house during the day." Erin [name] also stated that her goal is to "start ice skating again, going to my doctor appts, and joining the [employer]." "I have to change playgrounds. No more bars."   Smart, Sarra Rachels LCSW 01/25/2016, 3:31 PM

## 2016-01-25 NOTE — Progress Notes (Signed)
Pt has been up and down to the dayroom all evening.  She reports her day has been ok.  She says she is trying to use the walker more and the wheelchair less.  She feels she is getting more steady each day.  She denies SI/HI/AVH.  She interacts minimally with her peers.  She says she really wants to stay clean because she knows doing drugs and alcohol are hard on her.  Pt wants to return home at discharge.  Discharge plans are in process.  Pt makes her needs known to staff.  Support and encouragement offered.  Safety maintained with q15 minute checks.

## 2016-01-25 NOTE — Progress Notes (Signed)
Patient up and visible in the milieu. Ambulating with walker. Depression and hopelessness both rated at a 6/10, anxiety at a 5/10. Main focus per patient is on back pain which remains at a 9/10. Patient verbalizes confusion as to why opiates cannot be ordered. "My problem is alcohol, not pills." Patient medicated per orders, scheduled advil given for pain. Emotional support offered. Fall precautions reviewed and in place as patient is a high fall risk. Self inventory reviewed as well. Patient denies SI/HI and remains safe on level III obs. Jamie Kato

## 2016-01-25 NOTE — Progress Notes (Signed)
Pt attended spiritual care group on grief and loss facilitated by chaplain Jerene Pitch   Group opened with brief discussion and psycho-social ed around grief and loss in relationships and in relation to self - identifying life patterns, circumstances, changes that cause losses. Established group norm of speaking from own life experience. Group goal of establishing open and affirming space for members to share loss and experience with grief, normalize grief experience and provide psycho social education and grief support.   Erin [name]) was present throughout group.  She was verbose, but easily redirectable.  She related multiple losses of family members.  Spoke about death of her brother and not feeling as though she was able to "have closure" due to his cremation.  Erin [name] described staying in a women's shelter and not being able to see his body prior to his cremation.  Stated "I don't believe in cremation" and is worried that her mother will choose cremation.    In relating series of losses, Erin [name] related early loss of her sister, who was killed by her boyfriend when Erin [name] was 11.  Described having to go to funeral home and "pick out casket."  Described having coped with pain of losses through drinking.   Later in group as group members were speaking about how use of ETOH is intertwined with their process of grief, Erin [name] asked group members "what do you do if you just really like drinking?"

## 2016-01-25 NOTE — Progress Notes (Signed)
[facility] MD Progress Note  01/25/2016 6:50 PM Erin [name]  MRN:  [id number] Subjective:  Erin [name] states that she does not want to relapse states she is worried about it as she does not think she will be able to handle it if she was to relapse . The back pain seems to be improving. She thinks she will be able to go home safely tomorrow. She is looking forward to work with an ACT as states it could help her stay straight,  Principal Problem: Bipolar affective disorder, depressed, severe ([place]) Diagnosis:   Patient Active Problem List   Diagnosis Date Noted  . Alcohol dependence with alcohol-induced mood disorder ([place]) [F10.24]     Priority: High  . Bipolar affective disorder, depressed, severe ([place]) [F31.4] 02/06/2015    Priority: High  . Opioid dependence ([place]) [F11.20] 05/06/2014    Priority: High  . Cocaine dependence ([place]) [F14.20] 07/17/2013    Priority: High  . HYPOTHYROIDISM [E03.9] 03/02/2009    Priority: High  . Alcohol use disorder, severe, dependence ([place]) [F10.20] 01/12/2016  . Cocaine dependence with cocaine-induced mood disorder ([place]) [F14.24]   . Opioid dependence with withdrawal ([place]) [F11.23]   . Bipolar disorder ([place]) [F31.9] 02/09/2015  . Suicidal ideations [R45.851]   . Thrombocytopenia ([place]) [D69.6] 01/06/2013  . Sarcoidosis ([place]) [D86.9] 03/30/2010  . HYPERTENSION, BENIGN ESSENTIAL [I10] 01/11/2010  . ALCOHOLIC LIVER DISEASE [id number] 03/06/2008  . CIRRHOSIS, ALCOHOLIC, LIVER [id number] 0000000  . TOBACCO DEPENDENCE [F17.200] 02/22/2007  . EXTERNAL HEMORRHOIDS [K64.4] 01/11/2005  . PORTAL HYPERTENSION [K76.6] 01/11/2005   Total Time spent with patient: 15 minutes  Past Psychiatric History: see admission H and P  Past Medical History:  Past Medical History  Diagnosis Date  . Hypothyroidism   . Blood transfusion July 2012  . Depression   . Anxiety   . Bipolar 1 disorder ([place])   . Menorrhagia   . Liver failure ([place])   . Bronchitis   . Cirrhosis of liver  ([place])   . Wegener's granulomatosis ([place])   . History of sarcoidosis     Past Surgical History  Procedure Laterality Date  . Tubal ligation    . Laparoscopy    . Orthopedic surgery    . Cholecystectomy    . Fracture surgery    . Hernia repair    . Vaginal hysterectomy  07/27/2011    Procedure: HYSTERECTOMY VAGINAL;  Surgeon: [name], MD;  Location: [facility];  Service: Gynecology;  Laterality: N/A;  Vaginal Hysterectomy with Right Oophorectomy  . Laparotomy  07/28/2011    Procedure: EXPLORATORY LAPAROTOMY;  Surgeon: [name], MD;  Location: [facility];  Service: Gynecology;  Laterality: N/A;   Family History:  Family History  Problem Relation Age of Onset  . Diabetes Mother   . Hypertension Mother   . Diabetes Daughter   . Hypertension Daughter    Family Psychiatric  History: see admission H and P Social History:  History  Alcohol Use  . Yes    Comment: a fifth of vodka and a 12 pk a day of beer      History  Drug Use  . Yes  . Special: Cocaine, Marijuana    Comment: opioids, benzos    Social History   Social History  . Marital Status: Divorced    Spouse Name: N/A  . Number of Children: N/A  . Years of Education: N/A   Social History Main Topics  . Smoking status: Current Some Day Smoker -- 0.25 packs/day  .  Smokeless tobacco: Never Used  . Alcohol Use: Yes     Comment: a fifth of vodka and a 12 pk a day of beer   . Drug Use: Yes    Special: Cocaine, Marijuana     Comment: opioids, benzos  . Sexual Activity: Yes    Birth Control/ Protection: None   Other Topics Concern  . None   Social History Narrative   Additional Social History:                         Sleep: Fair  Appetite:  Fair  Current Medications: Current Facility-Administered Medications  Medication Dose Route Frequency Provider Last Rate Last Dose  . albuterol (PROVENTIL HFA;VENTOLIN HFA) 108 (90 Base) MCG/ACT inhaler 1-2 puff  1-2 puff Inhalation Q6H PRN Delfin Gant,  NP      . alum & mag hydroxide-simeth (MAALOX/MYLANTA) 200-200-20 MG/5ML suspension 30 mL  30 mL Oral PRN Delfin Gant, NP      . carisoprodol (SOMA) tablet 350 mg  350 mg Oral QID Nicholaus Bloom, MD   350 mg at 01/25/16 1626  . docusate sodium (COLACE) capsule 100 mg  100 mg Oral BID Delfin Gant, NP   100 mg at 01/25/16 1626  . DULoxetine (CYMBALTA) DR capsule 60 mg  60 mg Oral Daily Nicholaus Bloom, MD   60 mg at 01/25/16 [id number]  . furosemide (LASIX) tablet 20 mg  20 mg Oral Daily Delfin Gant, NP   20 mg at 01/25/16 0800  . gabapentin (NEURONTIN) capsule 300 mg  300 mg Oral TID Nicholaus Bloom, MD   300 mg at 01/25/16 1626  . gabapentin (NEURONTIN) capsule 300 mg  300 mg Oral QHS Nicholaus Bloom, MD   300 mg at 01/24/16 2101  . hydrochlorothiazide (HYDRODIURIL) tablet 25 mg  25 mg Oral Daily Delfin Gant, NP   25 mg at 01/25/16 0820  . ibuprofen (ADVIL,MOTRIN) tablet 800 mg  800 mg Oral QID Nicholaus Bloom, MD   800 mg at 01/25/16 1626  . loratadine (CLARITIN) tablet 10 mg  10 mg Oral Daily Delfin Gant, NP   10 mg at 01/25/16 0820  . LORazepam (ATIVAN) tablet 1 mg  1 mg Oral Q6H PRN Nicholaus Bloom, MD   1 mg at 01/24/16 2354  . multivitamin with minerals tablet 1 tablet  1 tablet Oral Daily Nicholaus Bloom, MD   1 tablet at 01/25/16 0820  . ondansetron (ZOFRAN) tablet 4 mg  4 mg Oral Q8H PRN Delfin Gant, NP   4 mg at 01/12/16 2130  . predniSONE (STERAPRED UNI-PAK 21 TAB) tablet 5 mg  5 mg Oral 4X daily taper Nicholaus Bloom, MD   5 mg at 01/25/16 1811  . propranolol (INDERAL) tablet 20 mg  20 mg Oral BID Delfin Gant, NP   Stopped at 01/25/16 1627  . QUEtiapine (SEROQUEL) tablet 200 mg  200 mg Oral QHS Nicholaus Bloom, MD   200 mg at 01/24/16 2101  . QUEtiapine (SEROQUEL) tablet 50 mg  50 mg Oral TID Nicholaus Bloom, MD   50 mg at 01/25/16 1626  . thiamine (VITAMIN B-1) tablet 100 mg  100 mg Oral Daily Nicholaus Bloom, MD   100 mg at 01/25/16 0820  . thyroid (ARMOUR)  tablet 30 mg  30 mg Oral QAC breakfast Delfin Gant, NP   30 mg at 01/25/16 0618  .  traZODone (DESYREL) tablet 25 mg  25 mg Oral TID Nicholaus Bloom, MD   25 mg at 01/25/16 1625  . traZODone (DESYREL) tablet 50 mg  50 mg Oral QHS,MR X 1 Laverle Hobby, PA-C   50 mg at 01/24/16 2101    Lab Results: No results found for this or any previous visit (from the past 48 hour(s)).  Physical Findings: AIMS: Facial and Oral Movements Muscles of Facial Expression: None, normal Lips and Perioral Area: None, normal Jaw: None, normal Tongue: None, normal,Extremity Movements Upper (arms, wrists, hands, fingers): None, normal Lower (legs, knees, ankles, toes): None, normal, Trunk Movements Neck, shoulders, hips: None, normal, Overall Severity Severity of abnormal movements (highest score from questions above): None, normal Incapacitation due to abnormal movements: None, normal Patient's awareness of abnormal movements (rate only patient's report): No Awareness, Dental Status Current problems with teeth and/or dentures?: No Does patient usually wear dentures?: No  CIWA:  CIWA-Ar Total: 4 COWS:     Musculoskeletal: Strength & Muscle Tone: within normal limits Gait & Station: normal Patient leans: normal  Psychiatric Specialty Exam: Review of Systems  Constitutional: Negative.   HENT: Negative.   Eyes: Negative.   Respiratory: Negative.   Cardiovascular: Negative.   Gastrointestinal: Negative.   Genitourinary: Negative.   Musculoskeletal: Positive for back pain.  Skin: Negative.   Neurological: Negative.   Endo/Heme/Allergies: Negative.   Psychiatric/Behavioral: Positive for substance abuse. The patient is nervous/anxious.     Blood pressure 99/52, pulse 69, temperature 97.7 F (36.5 C), temperature source Oral, resp. rate 20, height 5\' 7"  (1.702 m), weight 108.863 kg (240 lb).Body mass index is 37.58 kg/(m^2).  General Appearance: Fairly Groomed  Engineer, water::  Fair  Speech:  Clear  and Coherent  Volume:  Decreased  Mood:  Anxious and worried  Affect:  anxious worried  Thought Process:  Coherent and Goal Directed  Orientation:  Full (Time, Place, and Person)  Thought Content:  symptoms events worries concerns  Suicidal Thoughts:  No  Homicidal Thoughts:  No  Memory:  Immediate;   Fair Recent;   Fair Remote;   Fair  Judgement:  Fair  Insight:  Present and Shallow  Psychomotor Activity:  Normal  Concentration:  Fair  Recall:  [employer] of Knowledge:Fair  Language: Fair  Akathisia:  No  Handed:  Right  AIMS (if indicated):     Assets:  Desire for Improvement Housing  ADL's:  Intact  Cognition: WNL  Sleep:  Number of Hours: 5.25   Treatment Plan Summary: Daily contact with patient to assess and evaluate symptoms and progress in treatment and Medication management Supportive approach/coping skills Substance dependence; continue to work a relapse prevention plan Mood instability; continue to work with Seroquel Pain; continue to work with Neurontin/Soma Depression; continue the Cymbalta 60 mg  Work with CBT/mindfulness Reassess for D/C in the AM Namira Rosekrans A 01/25/2016, 6:50 PM

## 2016-01-26 MED ORDER — THYROID 30 MG PO TABS: 30.0000 mg | ORAL_TABLET | Freq: Every day | ORAL | Status: DC

## 2016-01-26 MED ORDER — GABAPENTIN 300 MG PO CAPS: ORAL_CAPSULE | ORAL | Status: DC

## 2016-01-26 MED ORDER — DOCUSATE SODIUM 100 MG PO CAPS: 100.0000 mg | ORAL_CAPSULE | Freq: Two times a day (BID) | ORAL | Status: DC

## 2016-01-26 MED ORDER — PREDNISONE 5 MG (21) PO TBPK: 5.0000 mg | ORAL_TABLET | Freq: Four times a day (QID) | ORAL | Status: DC

## 2016-01-26 MED ORDER — TRAZODONE HCL 50 MG PO TABS: ORAL_TABLET | ORAL | Status: DC

## 2016-01-26 MED ORDER — ALBUTEROL SULFATE HFA 108 (90 BASE) MCG/ACT IN AERS: 1.0000 | INHALATION_SPRAY | Freq: Four times a day (QID) | RESPIRATORY_TRACT | Status: DC | PRN

## 2016-01-26 MED ORDER — DULOXETINE HCL 60 MG PO CPEP: ORAL_CAPSULE | ORAL | Status: DC

## 2016-01-26 MED ORDER — HYDROCHLOROTHIAZIDE 25 MG PO TABS: 25.0000 mg | ORAL_TABLET | Freq: Every day | ORAL | Status: DC

## 2016-01-26 MED ORDER — FUROSEMIDE 20 MG PO TABS: 20.0000 mg | ORAL_TABLET | Freq: Every day | ORAL | Status: DC

## 2016-01-26 MED ORDER — QUETIAPINE FUMARATE 50 MG PO TABS: 50.0000 mg | ORAL_TABLET | Freq: Three times a day (TID) | ORAL | Status: DC

## 2016-01-26 MED ORDER — CARISOPRODOL 350 MG PO TABS: ORAL_TABLET | ORAL | Status: DC

## 2016-01-26 MED ORDER — PROPRANOLOL HCL 20 MG PO TABS: 20.0000 mg | ORAL_TABLET | Freq: Two times a day (BID) | ORAL | Status: DC

## 2016-01-26 MED ORDER — QUETIAPINE FUMARATE 200 MG PO TABS: 200.0000 mg | ORAL_TABLET | Freq: Every day | ORAL | Status: DC

## 2016-01-26 MED ORDER — LORATADINE 10 MG PO TABS: 10.0000 mg | ORAL_TABLET | Freq: Every day | ORAL | Status: DC

## 2016-01-26 NOTE — Progress Notes (Signed)
  [facility] Adult Case Management Discharge Plan :  Will you be returning to the same living situation after discharge:  Yes,  returning home At discharge, do you have transportation home?: Yes,  boyfriend  Do you have the ability to pay for your medications: Yes,  Medicare and Medicaid  Release of information consent forms completed and submitted to medical records by CSW.  Patient to Follow up at: Follow-up Information    Follow up with [facility].   Why:  Walk in between 8am-9am Monday through Friday for hospital follow-up/medication management/assessment for services.    Contact information:   [address] [place] [address] Phone: [phone] Fax: [phone]      [facility] with [facility]   Why:  You have been referred for [employer] (CST) services. Please call at discharge to check status of referral.    Contact information:   [id number] [place], [place] [address] Phone: [phone] Fax: [phone]      Next level of care provider has access to [place] and Suicide Prevention discussed: Yes,  SPE completed with pt, as she refused to consent to family contact. SPI pamphlet and mobile crisis information provided to pt and she was encouraged to share information with support network.   Have you used any form of tobacco in the last 30 days? (Cigarettes, Smokeless Tobacco, Cigars, and/or Pipes): Yes  Has patient been referred to the Quitline?: Patient refused referral  Patient has been referred for addiction treatment: Yes-see above.   Smart, Derin Granquist LCSW 01/26/2016, 10:23 AM

## 2016-01-26 NOTE — Progress Notes (Signed)
Pt reports that she is going home tomorrow and feels she is ready to go.  She wants to go back to her regular MD to get help with her back pain.  Pt denies SI/HI/AVH at this time.  She is denying any withdrawal symptoms.  Her gait has become more steady, and she is not using the walker all the time.  She has been in the dayroom watching TV along with some interaction with her peers.  Pt makes her needs known to staff.  Support and encouragement offered.  Safety maintained with q15 minute checks.

## 2016-01-26 NOTE — Progress Notes (Signed)
Patient ID: Erin [name], female   DOB: [birth year], 50 y.o.   MRN: [id number]  Pt discharged home with a family friend. Pt was stable and appreciative at that time. All papers and prescriptions were given and valuables returned. Verbal understanding expressed. Denies SI/HI and A/VH. Pt given opportunity to express concerns and ask questions.

## 2016-01-26 NOTE — BHH Suicide Risk Assessment (Addendum)
[facility] Discharge Suicide Risk Assessment   Principal Problem: Bipolar affective disorder, depressed, severe ([place]) Discharge Diagnoses:  Patient Active Problem List   Diagnosis Date Noted  . Alcohol use disorder, severe, dependence ([place]) [F10.20] 01/12/2016  . Cocaine dependence with cocaine-induced mood disorder ([place]) [F14.24]   . Opioid dependence with withdrawal ([place]) [F11.23]   . Bipolar disorder ([place]) [F31.9] 02/09/2015  . Alcohol dependence with alcohol-induced mood disorder ([place]) [F10.24]   . Bipolar affective disorder, depressed, severe ([place]) [F31.4] 02/06/2015  . Suicidal ideations [R45.851]   . Opioid dependence ([place]) [F11.20] 05/06/2014  . Cocaine dependence ([place]) [F14.20] 07/17/2013  . Thrombocytopenia ([place]) [D69.6] 01/06/2013  . Sarcoidosis ([place]) [D86.9] 03/30/2010  . HYPERTENSION, BENIGN ESSENTIAL [I10] 01/11/2010  . HYPOTHYROIDISM [E03.9] 03/02/2009  . ALCOHOLIC LIVER DISEASE [id number] 03/06/2008  . CIRRHOSIS, ALCOHOLIC, LIVER [id number] 0000000  . TOBACCO DEPENDENCE [F17.200] 02/22/2007  . EXTERNAL HEMORRHOIDS [K64.4] 01/11/2005  . PORTAL HYPERTENSION [K76.6] 01/11/2005    Total Time spent with patient: 30 minutes  Musculoskeletal: Strength & Muscle Tone: within normal limits Gait & Station: slowed due to back pain  Patient leans: N/A  Psychiatric Specialty Exam: ROS  Blood pressure 94/56, pulse 69, temperature 97.9 F (36.6 C), temperature source Oral, resp. rate 16, height 5\' 7"  (1.702 m), weight 240 lb (108.863 kg).Body mass index is 37.58 kg/(m^2).  General Appearance: Fairly Groomed  Engineer, water::  Good  Speech:  Normal Rate409  Volume:  Normal  Mood:  improved mood   Affect:  appropriate , reactive   Thought Process:  Linear  Orientation:  Other:  fully alert and attentive   Thought Content:  no hallucinations, no delusions, not internally preoccupied   Suicidal Thoughts:  No denies any suicidal or homicidal ideations, denies any self injurious  ideations  Homicidal Thoughts:  No  Memory:  recent and remote grossly intact   Judgement:  Other:  improved  Insight:  improved   Psychomotor Activity:  Normal  Concentration:  Good  Recall:  Good  Fund of Knowledge:Good  Language: Negative  Akathisia:  Negative  Handed:  Right  AIMS (if indicated):     Assets:  Desire for Improvement Resilience  Sleep:  Number of Hours: 4.5  Cognition: WNL  ADL's:  Intact   Mental Status Per Nursing Assessment::   On Admission:     Demographic Factors:  50 year old divorced female, lives alone, is on disability, has three adult children  Loss Factors: Relapse on alcohol , on disability   Historical Factors: History of depression, history of alcohol dependence.  Risk Reduction Factors:   Sense of responsibility to family and Positive coping skills or problem solving skills  Continued Clinical Symptoms:  As noted, at this time patient is improved compared to admission- mood improved and at this time denies depression, affect appropriate, reactive, no thought disorder, no SI or HI, no psychotic symptoms , future oriented . * We reviewed medication side effects, to include risk of sedation- patient denies side effects at this time. Cognitive Features That Contribute To Risk:  No gross cognitive deficits noted upon discharge. Is alert , attentive, and oriented x 3   Suicide Risk:  Mild:  Suicidal ideation of limited frequency, intensity, duration, and specificity.  There are no identifiable plans, no associated intent, mild dysphoria and related symptoms, good self-control (both objective and subjective assessment), few other risk factors, and identifiable protective factors, including available and accessible social support.  Follow-up Information    Follow up with [facility].  Why:  Walk in between 8am-9am Monday through Friday for hospital follow-up/medication management/assessment for services.    Contact information:   [address], [place] [address] Phone: [phone] Fax: [phone]      [facility] with [facility]   Why:  You have been referred for [employer] (CST) services. Please call at discharge to check status of referral.    Contact information:   [id number] [place], [place] [address] Phone: [phone] Fax: [phone]      Plan Of Care/Follow-up recommendations:  Activity:  as tolerated Diet:  regular Tests:  NA Other:  see below  As reviewed with RN, CSW, reviewing Dr. Iona Coach note, patient is scheduled for discharge today. She feels ready for discharge. Patient is leaving unit in good spirits . Plans to return home .  Follow up as above. She has an established PCP for management of medical issues as needed   Neita Garnet, MD 01/26/2016, 10:02 AM

## 2016-01-26 NOTE — Plan of Care (Signed)
Problem: Diagnosis: Increased Risk For Suicide Attempt Goal: STG-Patient Will Attend All Groups On The Unit Outcome: Adequate for Discharge Pt has made an effort to attend groups since becoming more steady on her feet.

## 2016-01-26 NOTE — Plan of Care (Signed)
Problem: Diagnosis: Increased Risk For Suicide Attempt Goal: LTG-Patient Will Report Absence of Withdrawal Symptoms LTG (by discharge): Patient will report absence of withdrawal symptoms.  Outcome: Completed/Met Date Met:  01/26/16 Pt is denying withdrawal symptoms at this time and says she is ready for discharge.

## 2016-01-26 NOTE — Progress Notes (Signed)
Patient ID: Erin [name], female   DOB: 07-Apr-1966, 50 y.o.   MRN: [id number]  Pt currently presents with a flat affect and anxious behavior. Per self inventory, pt rates depression at a 5, hopelessness 0 and anxiety 5. Pt's daily goal is "leaving and not to drink, see my doctor about what to get pain down so I don't go to street for med. Try working on getting in [place]." and they intend to do so by "get pain down." Pt reports fair sleep, a good appetite, low energy and good concentration.   Pt provided with medications per providers orders. Pt's labs and vitals were monitored throughout the day. Pt supported emotionally and encouraged to express concerns and questions. Pt educated on medications, suicide prevention resources and the importance of medication adherence.  Pt's safety ensured with 15 minute and environmental checks. Pt currently denies SI/HI and A/V hallucinations. Pt verbally agrees to seek staff if SI/HI or A/VH occurs and to consult with staff before acting on these thoughts. Pt writes "I thank the whole staff for there wonderful care and making me feel like I was someone." Pt to be discharged today. Will continue POC.

## 2016-01-26 NOTE — Discharge Summary (Signed)
Physician Discharge Summary Note  Patient:  Erin [name] is an 50 y.o., female MRN:  [id number] DOB:  [birth year] Patient phone:  [phone] (home)  Patient address:   [address] [place] [address],   Total Time spent with patient: Greater than 30 minutes  Date of Admission:  01/12/2016  Date of Discharge: 01-26-16  Reason for Admission:  Drug detox & mood stabilization treatments  Principal Problem: Bipolar affective disorder, depressed, severe [facility])  Discharge Diagnoses: Patient Active Problem List   Diagnosis Date Noted  . Alcohol use disorder, severe, dependence ([place]) [F10.20] 01/12/2016  . Cocaine dependence with cocaine-induced mood disorder ([place]) [F14.24]   . Opioid dependence with withdrawal ([place]) [F11.23]   . Bipolar disorder ([place]) [F31.9] 02/09/2015  . Alcohol dependence with alcohol-induced mood disorder ([place]) [F10.24]   . Bipolar affective disorder, depressed, severe ([place]) [F31.4] 02/06/2015  . Suicidal ideations [R45.851]   . Opioid dependence ([place]) [F11.20] 05/06/2014  . Cocaine dependence ([place]) [F14.20] 07/17/2013  . Thrombocytopenia ([place]) [D69.6] 01/06/2013  . Sarcoidosis ([place]) [D86.9] 03/30/2010  . HYPERTENSION, BENIGN ESSENTIAL [I10] 01/11/2010  . HYPOTHYROIDISM [E03.9] 03/02/2009  . ALCOHOLIC LIVER DISEASE [id number] 03/06/2008  . CIRRHOSIS, ALCOHOLIC, LIVER [id number]  . TOBACCO DEPENDENCE [F17.200] 02/22/2007  . EXTERNAL HEMORRHOIDS [K64.4] 01/11/2005  . PORTAL HYPERTENSION [K76.6] 01/11/2005   Musculoskeletal: Strength & Muscle Tone: within normal limits Gait & Station: normal Patient leans: N/A  Psychiatric Specialty Exam: Physical Exam  Constitutional: She is oriented to person, place, and time. She appears well-developed.  HENT:  Head: Normocephalic.  Eyes: Pupils are equal, round, and reactive to light.  Neck: Normal range of motion.  Cardiovascular: Normal rate.   Respiratory: Effort normal.  GI: Soft.   Genitourinary:  Denies any issues in this area  Musculoskeletal: Normal range of motion.  Neurological: She is alert and oriented to person, place, and time.  Skin: Skin is warm and dry.  Psychiatric: Her speech is normal and behavior is normal. Judgment and thought content normal. Her mood appears not anxious. Her affect is not angry, not blunt, not labile and not inappropriate. Cognition and memory are normal. She does not exhibit a depressed mood.    Review of Systems  Constitutional: Negative.   HENT: Negative.   Eyes: Negative.   Respiratory: Negative.   Cardiovascular: Negative.   Gastrointestinal: Negative.   Genitourinary: Negative.   Musculoskeletal: Negative.   Skin: Negative.   Neurological: Negative.   Endo/Heme/Allergies: Negative.   Psychiatric/Behavioral: Positive for depression (Stable) and substance abuse (Polysubstance dependence). Negative for suicidal ideas and hallucinations. The patient has insomnia (Stable). The patient is not nervous/anxious.     Blood pressure 94/56, pulse 69, temperature 97.9 F (36.6 C), temperature source Oral, resp. rate 16, height 5\' 7"  (1.702 m), weight 108.863 kg (240 lb).Body mass index is 37.58 kg/(m^2).  See Md's SRA   Past Medical History:  Past Medical History  Diagnosis Date  . Hypothyroidism   . Blood transfusion July 2012  . Depression   . Anxiety   . Bipolar 1 disorder ([place])   . Menorrhagia   . Liver failure ([place])   . Bronchitis   . Cirrhosis of liver ([place])   . Wegener's granulomatosis ([place])   . History of sarcoidosis     Past Surgical History  Procedure Laterality Date  . Tubal ligation    . Laparoscopy    . Orthopedic surgery    . Cholecystectomy    . Fracture surgery    .  Hernia repair    . Vaginal hysterectomy  07/27/2011    Procedure: HYSTERECTOMY VAGINAL;  Surgeon: [name], MD;  Location: [facility];  Service: Gynecology;  Laterality: N/A;  Vaginal Hysterectomy with Right Oophorectomy  . Laparotomy   07/28/2011    Procedure: EXPLORATORY LAPAROTOMY;  Surgeon: [name], MD;  Location: [facility];  Service: Gynecology;  Laterality: N/A;   Family History:  Family History  Problem Relation Age of Onset  . Diabetes Mother   . Hypertension Mother   . Diabetes Daughter   . Hypertension Daughter    Social History:  History  Alcohol Use  . Yes    Comment: a fifth of vodka and a 12 pk a day of beer      History  Drug Use  . Yes  . Special: Cocaine, Marijuana    Comment: opioids, benzos    Social History   Social History  . Marital Status: Divorced    Spouse Name: N/A  . Number of Children: N/A  . Years of Education: N/A   Social History Main Topics  . Smoking status: Current Some Day Smoker -- 0.25 packs/day  . Smokeless tobacco: Never Used  . Alcohol Use: Yes     Comment: a fifth of vodka and a 12 pk a day of beer   . Drug Use: Yes    Special: Cocaine, Marijuana     Comment: opioids, benzos  . Sexual Activity: Yes    Birth Control/ Protection: None   Other Topics Concern  . None   Social History Narrative   Risk to Self: Is patient at risk for suicide?: No Risk to Others: No Prior Inpatient Therapy: Yes ([facility] x 3) Prior Outpatient Therapy: Yes  Level of Care:  OP  Hospital Course:  50 Y/O female known to our service who states she relapsed on alcohol 2 months ago. States that she has been drinking everyday. States she got around the wrong people. It was the holidays. States "I am a popular girl," I have a lot of friends I go to bars and they provide alcohol for me." States she has never felt this sick before. She states she is also using cocaine. Has kept herself drinking and using some more cocaine and then drinking. She admits she started to have some withdrawal, "seeing things" feeling very anxious shaking. She admits she has not been taking her medications for a while. She is having a birthday tomorrow and she knows that around her birthday she gets worst so she  came for help. States she wants to go to rehab.  Erin [name] was admitted to the hospital for alcohol detoxification treatment. Her blood alcohol level upon admission was 370 per toxicology tests reports. She was intoxicated. Erin [name]'s lab reports also indicated elevated liver enzymes (AST), possibly from chronic alcoholism. As a result, not a candidate for Librium detox protocols. This is because, Librium is a long acting Benzodiazepine with a long half-life. If used for this particular detox treatment will impose heavily on already compromised liver enzymes. By using Erin [name] received a cleaner detox treatment without the lingering adverse effects of the Librium capsules.  Besides the detox treatment, Erin [name] also was medicated and discharged on Neurontin 300 mg for substance withdrawal syndrome/agitation, Trazodone 50 mg Q bedtime for sleep, Duloxetine 60 mg for depression, Seroquel 50 mg & 200 mg respectively for agitation/mood control & Propranolol 20 mg for anxiety/HTN. She was resumed on all her pertinent home medications for her other pre-existing  medical issues that she presented. She tolerated her treatment regimen without any significant adverse effects or reactions reported. She participated in the AA/NA meetings & group counseling sessions being offered and held on this unit. She learned coping skills.  Erin [name] has completed detox treatment & her mood is stably. This is evidenced by her reports of improved mood, absence of suicidal ideations or substance withdrawal symptoms. She is currently being discharged to her home to continue treatment on an outpatient basis. She has been given all the necessary information needed to make this appointments without problems. Upon discharge, she denies any SIHI, AVH, delusional thoughts, paranoia and or substance withdrawal symptoms. Erin [name] with all personal belongings in no apparent distress. Transportation per boyfriend.   Consults:   psychiatry  Consults:  psychiatry  Significant Diagnostic Studies:  labs: CBC with diff, CMP, UDS, toxicology tests, U/A, reports reviewed, stable  Discharge Vitals:   Blood pressure 94/56, pulse 69, temperature 97.9 F (36.6 C), temperature source Oral, resp. rate 16, height 5\' 7"  (1.702 m), weight 108.863 kg (240 lb). Body mass index is 37.58 kg/(m^2). Lab Results:   No results found for this or any previous visit (from the past 72 hour(s)).  Physical Findings: AIMS: Facial and Oral Movements Muscles of Facial Expression: None, normal Lips and Perioral Area: None, normal Jaw: None, normal Tongue: None, normal,Extremity Movements Upper (arms, wrists, hands, fingers): None, normal Lower (legs, knees, ankles, toes): None, normal, Trunk Movements Neck, shoulders, hips: None, normal, Overall Severity Severity of abnormal movements (highest score from questions above): None, normal Incapacitation due to abnormal movements: None, normal Patient's awareness of abnormal movements (rate only patient's report): No Awareness, Dental Status Current problems with teeth and/or dentures?: No Does patient usually wear dentures?: No  CIWA:  CIWA-Ar Total: 4 COWS:     See Psychiatric Specialty Exam and Suicide Risk Assessment completed by Attending Physician prior to discharge.  Discharge destination:  Home  Is patient on multiple antipsychotic therapies at discharge:  No   Has Patient had three or more failed trials of antipsychotic monotherapy by history:  No  Recommended Plan for Multiple Antipsychotic Therapies: NA     Discharge Instructions    Discharge instructions    Complete by:  As directed   FYI: Erin [name] please follow-up with your primary care provider for all your medical care needs.            Medication List    STOP taking these medications        buPROPion 150 MG 12 hr tablet  Commonly known as:  WELLBUTRIN SR     clonazePAM 0.5 MG tablet  Commonly known as:   KLONOPIN     meloxicam 7.5 MG tablet  Commonly known as:  MOBIC     polyethylene glycol powder powder  Commonly known as:  GLYCOLAX/MIRALAX      TAKE these medications      Indication   albuterol 108 (90 Base) MCG/ACT inhaler  Commonly known as:  PROVENTIL HFA;VENTOLIN HFA  Inhale 1-2 puffs into the lungs every 6 (six) hours as needed for wheezing.   Indication:  Asthma     carisoprodol 350 MG tablet  Commonly known as:  SOMA  Take 1 tablet three times daily: For muscle cramps   Indication:  Musculoskeletal Pain     docusate sodium 100 MG capsule  Commonly known as:  COLACE  Take 1 capsule (100 mg total) by mouth 2 (two) times daily. (This is an over  the counter medicine): For constipation   Indication:  Constipation     DULoxetine 60 MG capsule  Commonly known as:  CYMBALTA  Take 1 tablet (60 mg) daily: For depression/muscle pain   Indication:  Major Depressive Disorder, Musculoskeletal Pain     furosemide 20 MG tablet  Commonly known as:  LASIX  Take 1 tablet (20 mg total) by mouth daily. For swellings   Indication:  Edema     gabapentin 300 MG capsule  Commonly known as:  NEURONTIN  Take 1 tablet (300 mg) four times daily: For agitation/pain   Indication:  Aggressive Behavior, Alcohol Withdrawal Syndrome, Trouble Sleeping     hydrochlorothiazide 25 MG tablet  Commonly known as:  HYDRODIURIL  Take 1 tablet (25 mg total) by mouth daily. For high blood pressure   Indication:  High Blood Pressure     loratadine 10 MG tablet  Commonly known as:  CLARITIN  Take 1 tablet (10 mg total) by mouth daily. (may purchase from over the counter at yr local pharmacy): For allergies   Indication:  Perennial Rhinitis, Hayfever     predniSONE 5 MG (21) Tbpk tablet  Commonly known as:  STERAPRED UNI-PAK 21 TAB  Take 1 tablet (5 mg total) by mouth taper from 4 doses each day to 1 dose and stop. Use as directed: For inflammation   Indication:  Inflammation     propranolol 20 MG  tablet  Commonly known as:  INDERAL  Take 1 tablet (20 mg total) by mouth 2 (two) times daily. For anxiety   Indication:  Anxiety     QUEtiapine 200 MG tablet  Commonly known as:  SEROQUEL  Take 1 tablet (200 mg total) by mouth at bedtime.   Indication:  Mood control     QUEtiapine 50 MG tablet  Commonly known as:  SEROQUEL  Take 1 tablet (50 mg total) by mouth 3 (three) times daily. For agitation   Indication:  Agitation     thyroid 30 MG tablet  Commonly known as:  ARMOUR  Take 1 tablet (30 mg total) by mouth daily before breakfast. For low thyroid function   Indication:  Underactive Thyroid     traZODone 50 MG tablet  Commonly known as:  DESYREL  Take 1 tablet (25 mg) three times daily & 2 tablets (50 mg) at bedtime: For depression/insomnia   Indication:  Trouble Sleeping, Major Depressive Disorder       Follow-up Information    Follow up with [facility].   Why:  Walk in between 8am-9am Monday through Friday for hospital follow-up/medication management/assessment for services.    Contact information:   [address], [place] [address] Phone: [phone] Fax: [phone]      [facility] with [facility]   Why:  You have been referred for [employer] ([facility]) services. Please call at discharge to check status of referral.    Contact information:   [address] [place] [id number] [place], [place] [address] Phone: [phone] Fax: [phone]     Follow-up recommendations: Activity:  As tolerated Diet: As recommended by your primary care doctor. Keep all scheduled follow-up appointments as recommended.    Comments:  Take all your medications as prescribed by your mental healthcare provider. Report any adverse effects and or reactions from your medicines to your outpatient provider promptly. Patient is instructed and cautioned to not engage in alcohol and or illegal drug use while on prescription medicines. In the event of worsening  symptoms,  patient is instructed to call the crisis hotline, 911 and or go to the nearest ED for appropriate evaluation and treatment of symptoms. Follow-up with your primary care provider for your other medical issues, concerns and or health care needs.   Total Discharge Time: Greater than 30 minutes  Signed: Encarnacion Slates, PMHNP, FNP-BC 01/26/2016, 11:45 AM  Patient seen, Suicide Assessment Completed.  Disposition Plan Reviewed

## 2016-01-26 NOTE — Progress Notes (Signed)
Patient ID: Erin [name], female   DOB: 12-11-1966, 50 y.o.   MRN: [id number]  Adult Psychoeducational Group Note  Date:  01/26/2016 Time: 10:50am  Group Topic/Focus:  Orientation:   The focus of this group is to educate the patient on the purpose and policies of crisis stabilization and provide a format to answer questions about their admission.  The group details unit policies and expectations of patients while admitted.  Participation Level:  Active  Participation Quality:  Appropriate  Affect:  Appropriate  Cognitive:  Appropriate  Insight: Appropriate  Engagement in Group:  Engaged  Modes of Intervention:  Education, Orientation and Support  Additional Comments:  Pt supported peers in group today, active participation.   Elenore Rota 01/26/2016, 11:52 AM

## 2016-01-26 NOTE — Tx Team (Signed)
Interdisciplinary Treatment Plan Update (Adult)  Date:  01/26/2016  Time Reviewed:  10:25 AM   Progress in Treatment: Attending groups: Yes Participating in groups:  Yes Taking medication as prescribed:  Yes. Tolerating medication:  Yes. Family/Significant othe contact made: SPE completed with pt, as she refused to consent to family contact.  Patient understands diagnosis:  Yes. and As evidenced by:  seeking treatment for alcohol abuse/opiate abuse, depression, SI, and for medication stabillization Discussing patient identified problems/goals with staff:  Yes. Medical problems stabilized or resolved:  Yes. Denies suicidal/homicidal ideation: Yes. Issues/concerns per patient self-inventory:  Other:  Discharge Plan or Barriers: Pt is returning home today with boyfriend. Pt plans to see PCP (does not remember who he is but has info at home). Pt referred to [facility] and was instructed to follow-up with them at d/c. [facility] for mental health needs in the meantime.   Reason for Continuation of Hospitalization: none  Comments:  Erin [name] is an 50 y.o. female with history of depression, anxiety, and Bipolar I Disorder. Writer met with patient face to face. Patient sts, "I want to go to [facility]", "I want to be on Dr. Baird Kay", and I please put me on the hall for mental health and not psychotic patient's". The patient has a history of significant alcohol abuse. Today she is requesting detox and wants treatment at [facility]. She states that she has been in alcohol detox multiple times in the past but in past several months she has started drinking again after 8 months of sobriety secondary to her "life stressors". Patient reports that multiple people in her family struggle with alcoholism. She has a brother whom she found dead in a hotel after suicide. Patient's age of first use is 50 yrs old. Her last drink was late last night going into the early part of this morning. She denies suicidal thoughts  or self, she states that she is not hallucinating, she does feel significantly depressed. Her depression is evidenced by increased crying spells and fatigue. Diagnosis: Alcohol Abuse, Anxiety Disorder NOS and Depressive Disorder NOS  Estimated length of stay:  D/c today   Additional Comments:  Patient and CSW reviewed pt's identified goals and treatment plan. Patient verbalized understanding and agreed to treatment plan. CSW reviewed [facility] "Discharge Process and Patient Involvement" Form. Pt verbalized understanding of information provided and signed form.    Review of initial/current patient goals per problem list:  1. Goal(s): Patient will participate in aftercare plan  Met: Yes   Target date: at discharge  As evidenced by: Patient will participate within aftercare plan AEB aftercare provider and housing plan at discharge being identified.  1/18: CSW assessing for appropriate referrals.   1/23: Pt plans to return home and follow-up with mental health or her PCP. CSW continuing to assess.   1/27: Continuing to assess.   1/31: Pt to return home; Referred to [facility]. [facility] in the meantime.   2. Goal (s): Patient will exhibit decreased depressive symptoms and suicidal ideations.  Met: No.    Target date: at discharge  As evidenced by: Patient will utilize self rating of depression at 3 or below and demonstrate decreased signs of depression or be deemed stable for discharge by MD.  1/18: Pt rates depression as high. Passive SI at times/currently reports no SI. No AVH/HI.   1/23: Pt rates depression as 4/10 and presents with depressed mood/lethargic affect. Denies SI/HI/AVH.   1/27: Pt rates depression as 8/10 and presents with  depressed mood/flat affect.   1/31: Pt rates depression as 2/10 and presents with depressed mood and calm affect. Denies SI/HI/AVH.   3. Goal(s): Patient will demonstrate decreased signs of withdrawal due to substance abuse  Met:Yes  Target  date:at discharge   As evidenced by: Patient will produce a CIWA/COWS score of 0, have stable vitals signs, and no symptoms of withdrawal.  1/18: Pt reports high withdrawals with current CIWA score of 11 and stable vitals.   1/23: Pt reports that withdrawals are lessoning with CIWA score of 4 and stable vitals. Goal progressing.   1/27: Pt continues to report mild withdrawals. No recent CIWA taken. Stable vitals. Goal progressing.   1/31: Pt reports no signs of withdrawal with low BP today. No CIWA score. Per MD, pt is medically stable for discharge.   Attendees: Patient:   01/26/2016 10:25 AM   Family:   01/26/2016 10:25 AM   Physician:  Dr. Parke Poisson MD 01/26/2016 10:25 AM   Nursing:  Betsey Holiday RN 01/26/2016 10:25 AM   Clinical Social Worker: Maxie Better, LCSW 01/26/2016 10:25 AM   Clinical Social Worker: Erasmo Downer Drinkard LCSWA;  01/26/2016 10:25 AM   Other:  Gerline Legacy Nurse Case Manager 01/26/2016 10:25 AM   Other:  Agustina Caroli NP 01/26/2016 10:25 AM   Other:   01/26/2016 10:25 AM   Other:  01/26/2016 10:25 AM   Other:  01/26/2016 10:25 AM   Other:  01/26/2016 10:25 AM    01/26/2016 10:25 AM    01/26/2016 10:25 AM    01/26/2016 10:25 AM    01/26/2016 10:25 AM    Scribe for Treatment Team:   Maxie Better, LCSW 01/26/2016 10:25 AM

## 2016-05-27 ENCOUNTER — Other Ambulatory Visit: Payer: Self-pay | Admitting: Gastroenterology

## 2016-05-27 DIAGNOSIS — K7030 Alcoholic cirrhosis of liver without ascites: Secondary | ICD-10-CM

## 2016-06-03 ENCOUNTER — Other Ambulatory Visit: Payer: Self-pay

## 2016-07-13 ENCOUNTER — Ambulatory Visit (HOSPITAL_BASED_OUTPATIENT_CLINIC_OR_DEPARTMENT_OTHER): Payer: Medicare Other | Attending: Internal Medicine

## 2016-10-04 ENCOUNTER — Emergency Department (HOSPITAL_COMMUNITY)
Admission: EM | Admit: 2016-10-04 | Discharge: 2016-10-04 | Disposition: A | Discharge status: Home / Self Care | Payer: Medicare Other | Attending: Emergency Medicine | Admitting: Emergency Medicine

## 2016-10-04 ENCOUNTER — Encounter (HOSPITAL_COMMUNITY): Payer: Self-pay | Admitting: Emergency Medicine

## 2016-10-04 DIAGNOSIS — Y999 Unspecified external cause status: Secondary | ICD-10-CM | POA: Diagnosis not present

## 2016-10-04 DIAGNOSIS — Y939 Activity, unspecified: Secondary | ICD-10-CM | POA: Diagnosis not present

## 2016-10-04 DIAGNOSIS — F17210 Nicotine dependence, cigarettes, uncomplicated: Secondary | ICD-10-CM | POA: Insufficient documentation

## 2016-10-04 DIAGNOSIS — Z79899 Other long term (current) drug therapy: Secondary | ICD-10-CM | POA: Diagnosis not present

## 2016-10-04 DIAGNOSIS — X58XXXA Exposure to other specified factors, initial encounter: Secondary | ICD-10-CM | POA: Diagnosis not present

## 2016-10-04 DIAGNOSIS — Y929 Unspecified place or not applicable: Secondary | ICD-10-CM | POA: Diagnosis not present

## 2016-10-04 DIAGNOSIS — E039 Hypothyroidism, unspecified: Secondary | ICD-10-CM | POA: Insufficient documentation

## 2016-10-04 DIAGNOSIS — S301XXA Contusion of abdominal wall, initial encounter: Secondary | ICD-10-CM | POA: Diagnosis present

## 2016-10-04 DIAGNOSIS — Z5321 Procedure and treatment not carried out due to patient leaving prior to being seen by health care provider: Secondary | ICD-10-CM | POA: Insufficient documentation

## 2016-10-04 LAB — URINALYSIS, ROUTINE W REFLEX MICROSCOPIC
BILIRUBIN URINE: NEGATIVE
Glucose, UA: NEGATIVE mg/dL
Hgb urine dipstick: NEGATIVE
KETONES UR: NEGATIVE mg/dL
Leukocytes, UA: NEGATIVE
NITRITE: NEGATIVE
PROTEIN: NEGATIVE mg/dL
Specific Gravity, Urine: 1.006 (ref 1.005–1.030)
pH: 5.5 (ref 5.0–8.0)

## 2016-10-04 LAB — COMPREHENSIVE METABOLIC PANEL
ALBUMIN: 4.8 g/dL (ref 3.5–5.0)
ALK PHOS: 84 U/L (ref 38–126)
ALT: 20 U/L (ref 14–54)
ANION GAP: 11 (ref 5–15)
AST: 42 U/L — ABNORMAL HIGH (ref 15–41)
BILIRUBIN TOTAL: 0.8 mg/dL (ref 0.3–1.2)
BUN: 17 mg/dL (ref 6–20)
CALCIUM: 9.7 mg/dL (ref 8.9–10.3)
CO2: 23 mmol/L (ref 22–32)
Chloride: 104 mmol/L (ref 101–111)
Creatinine, Ser: 0.96 mg/dL (ref 0.44–1.00)
GFR calc Af Amer: >60 mL/min (ref >60)
GFR calc non Af Amer: >60 mL/min (ref >60)
GLUCOSE: 95 mg/dL (ref 65–99)
Potassium: 3.8 mmol/L (ref 3.5–5.1)
Sodium: 138 mmol/L (ref 135–145)
TOTAL PROTEIN: 8.2 g/dL — AB (ref 6.5–8.1)

## 2016-10-04 LAB — CBC
HCT: 40.2 % (ref 36.0–46.0)
HEMOGLOBIN: 13.8 g/dL (ref 12.0–15.0)
MCH: 33.3 pg (ref 26.0–34.0)
MCHC: 34.3 g/dL (ref 30.0–36.0)
MCV: 97.1 fL (ref 78.0–100.0)
PLATELETS: 83 10*3/uL — AB (ref 150–400)
RBC: 4.14 MIL/uL (ref 3.87–5.11)
RDW: 13.6 % (ref 11.5–15.5)
WBC: 3.3 10*3/uL — ABNORMAL LOW (ref 4.0–10.5)

## 2016-10-04 LAB — LIPASE, BLOOD: Lipase: 33 U/L (ref 11–51)

## 2016-10-04 NOTE — ED Triage Notes (Signed)
On assessment, distention is noted with 2 small areas of bruising on abdomen.  No signs of internal bleeding.

## 2016-10-04 NOTE — ED Triage Notes (Signed)
Pt comes from home via EMS with complaints of abdominal pain.  Reports hx of cirrhosis and sarcoidosis.  Reports bruising on her abdomen and distention.  States this happens because of the cirrhosis.  Met EMS at the end of her drive way. Ambulatory to triage. Denies N&V.

## 2016-10-04 NOTE — ED Notes (Signed)
Pt has still not returned.  Will d/c.

## 2016-10-04 NOTE — ED Notes (Signed)
Pt was walking out of bathroom and took off towards side exit towards back of ED. Pt asked what she was doing and she stated she felt like she was going to get sick.  Asked patient if I could get her an emesis bag and patient did not respond and continued walking.

## 2016-12-12 ENCOUNTER — Ambulatory Visit (HOSPITAL_BASED_OUTPATIENT_CLINIC_OR_DEPARTMENT_OTHER): Payer: Medicare Other | Attending: Internal Medicine

## 2017-01-28 ENCOUNTER — Emergency Department (HOSPITAL_COMMUNITY)
Admission: EM | Admit: 2017-01-28 | Discharge: 2017-01-28 | Disposition: A | Discharge status: Home / Self Care | Payer: Medicare Other | Attending: Emergency Medicine | Admitting: Emergency Medicine

## 2017-01-28 ENCOUNTER — Emergency Department (HOSPITAL_COMMUNITY): Payer: Medicare Other

## 2017-01-28 ENCOUNTER — Encounter (HOSPITAL_COMMUNITY): Payer: Self-pay | Admitting: Emergency Medicine

## 2017-01-28 DIAGNOSIS — I10 Essential (primary) hypertension: Secondary | ICD-10-CM | POA: Insufficient documentation

## 2017-01-28 DIAGNOSIS — J40 Bronchitis, not specified as acute or chronic: Secondary | ICD-10-CM | POA: Diagnosis not present

## 2017-01-28 DIAGNOSIS — R0602 Shortness of breath: Secondary | ICD-10-CM | POA: Diagnosis present

## 2017-01-28 DIAGNOSIS — E039 Hypothyroidism, unspecified: Secondary | ICD-10-CM | POA: Diagnosis not present

## 2017-01-28 DIAGNOSIS — F17210 Nicotine dependence, cigarettes, uncomplicated: Secondary | ICD-10-CM | POA: Diagnosis not present

## 2017-01-28 LAB — CBC WITH DIFFERENTIAL/PLATELET
Basophils Absolute: 0 10*3/uL (ref 0.0–0.1)
Basophils Relative: 1 %
EOS PCT: 2 %
Eosinophils Absolute: 0.1 10*3/uL (ref 0.0–0.7)
HEMATOCRIT: 39.8 % (ref 36.0–46.0)
Hemoglobin: 13.9 g/dL (ref 12.0–15.0)
LYMPHS ABS: 1.7 10*3/uL (ref 0.7–4.0)
LYMPHS PCT: 39 %
MCH: 32.6 pg (ref 26.0–34.0)
MCHC: 34.9 g/dL (ref 30.0–36.0)
MCV: 93.2 fL (ref 78.0–100.0)
MONO ABS: 0.3 10*3/uL (ref 0.1–1.0)
MONOS PCT: 8 %
NEUTROS ABS: 2.2 10*3/uL (ref 1.7–7.7)
Neutrophils Relative %: 51 %
Platelets: 101 10*3/uL — ABNORMAL LOW (ref 150–400)
RBC: 4.27 MIL/uL (ref 3.87–5.11)
RDW: 14.3 % (ref 11.5–15.5)
WBC: 4.4 10*3/uL (ref 4.0–10.5)

## 2017-01-28 LAB — COMPREHENSIVE METABOLIC PANEL
ALBUMIN: 4.3 g/dL (ref 3.5–5.0)
ALT: 29 U/L (ref 14–54)
AST: 85 U/L — ABNORMAL HIGH (ref 15–41)
Alkaline Phosphatase: 143 U/L — ABNORMAL HIGH (ref 38–126)
Anion gap: 13 (ref 5–15)
BUN: 17 mg/dL (ref 6–20)
CHLORIDE: 100 mmol/L — AB (ref 101–111)
CO2: 27 mmol/L (ref 22–32)
CREATININE: 0.8 mg/dL (ref 0.44–1.00)
Calcium: 9.2 mg/dL (ref 8.9–10.3)
GFR calc Af Amer: >60 mL/min (ref >60)
GFR calc non Af Amer: >60 mL/min (ref >60)
GLUCOSE: 121 mg/dL — AB (ref 65–99)
POTASSIUM: 3.6 mmol/L (ref 3.5–5.1)
Sodium: 140 mmol/L (ref 135–145)
Total Bilirubin: 1.3 mg/dL — ABNORMAL HIGH (ref 0.3–1.2)
Total Protein: 8.2 g/dL — ABNORMAL HIGH (ref 6.5–8.1)

## 2017-01-28 LAB — I-STAT TROPONIN, ED: Troponin i, poc: 0.01 ng/mL (ref 0.00–0.08)

## 2017-01-28 LAB — LIPASE, BLOOD: Lipase: 36 U/L (ref 11–51)

## 2017-01-28 MED ORDER — IPRATROPIUM-ALBUTEROL 0.5-2.5 (3) MG/3ML IN SOLN
3.0000 mL | Freq: Once | RESPIRATORY_TRACT | Status: AC
  Administered 2017-01-28: 3 mL via RESPIRATORY_TRACT
  Filled 2017-01-28: qty 3

## 2017-01-28 MED ORDER — BENZONATATE 100 MG PO CAPS: 100.0000 mg | ORAL_CAPSULE | Freq: Three times a day (TID) | ORAL | 0 refills | Status: DC

## 2017-01-28 MED ORDER — PREDNISONE 10 MG PO TABS: 40.0000 mg | ORAL_TABLET | Freq: Every day | ORAL | 0 refills | Status: AC

## 2017-01-28 MED ORDER — ALBUTEROL SULFATE HFA 108 (90 BASE) MCG/ACT IN AERS: 2.0000 | INHALATION_SPRAY | RESPIRATORY_TRACT | 0 refills | Status: DC | PRN

## 2017-01-28 NOTE — ED Triage Notes (Signed)
Pt complaint of chills, cough, "starving to death", "scabs in nose", and SOB for a few days.

## 2017-01-28 NOTE — Discharge Instructions (Signed)

## 2017-01-28 NOTE — ED Notes (Signed)
Pt ambulates to xray and back w/o difficulty or distress

## 2017-01-28 NOTE — ED Provider Notes (Signed)
Emergency Department Provider Note   I have reviewed the triage vital signs and the nursing notes.   HISTORY  Chief Complaint Shortness of Breath   HPI Erin [name] is a 51 y.o. female with PMH of Bipolar, sarcoidosis, and cirrhosis presents to the emergency department for evaluation of chronic difficulty breathing and hoarse voice. Patient states that she's had difficulty breathing for the past 6 months which seems to have worsened over the last 2 months. She states she see her primary care physician about this but "he's an illegal doctor and I don't trust him." She denies any chest pain. She continues to smoke cigarettes. She notes voice changes over the past 2 months that is not significantly worsened. She presented to the emergency department today because she has had intermittent episodes of feeling like she can't breathe. They're worse when she is sleeping. At baseline she sleeps on 6 pillows this is not changed but she does sleep with a fan blowing in her face because of shortness of breath. She felt very hungry today and states I was starving to death" while working out and also note scabs in the nose. No difficulty swallowing solids or liquids.    Past Medical History:  Diagnosis Date  . Anxiety   . Bipolar 1 disorder ([place])   . Blood transfusion July 2012  . Bronchitis   . Cirrhosis of liver ([place])   . Depression   . History of sarcoidosis   . Hypothyroidism   . Liver failure ([place])   . Menorrhagia   . Wegener's granulomatosis [facility])     Patient Active Problem List   Diagnosis Date Noted  . Alcohol use disorder, severe, dependence ([place]) 01/12/2016  . Cocaine dependence with cocaine-induced mood disorder ([place])   . Opioid dependence with withdrawal ([place])   . Bipolar disorder ([place]) 02/09/2015  . Alcohol dependence with alcohol-induced mood disorder ([place])   . Bipolar affective disorder, depressed, severe ([place]) 02/06/2015  . Suicidal ideations   . Opioid dependence  ([place]) 05/06/2014  . Cocaine dependence ([place]) 07/17/2013  . Thrombocytopenia ([place]) 01/06/2013  . Sarcoidosis ([place]) 03/30/2010  . HYPERTENSION, BENIGN ESSENTIAL 01/11/2010  . HYPOTHYROIDISM 03/02/2009  . ALCOHOLIC LIVER DISEASE 0000000  . CIRRHOSIS, ALCOHOLIC, LIVER 0000000  . TOBACCO DEPENDENCE 02/22/2007  . EXTERNAL HEMORRHOIDS 01/11/2005  . PORTAL HYPERTENSION 01/11/2005    Past Surgical History:  Procedure Laterality Date  . CHOLECYSTECTOMY    . FRACTURE SURGERY    . HERNIA REPAIR    . LAPAROSCOPY    . LAPAROTOMY  07/28/2011   Procedure: EXPLORATORY LAPAROTOMY;  Surgeon: [name], MD;  Location: [facility];  Service: Gynecology;  Laterality: N/A;  . ORTHOPEDIC SURGERY    . TUBAL LIGATION    . VAGINAL HYSTERECTOMY  07/27/2011   Procedure: HYSTERECTOMY VAGINAL;  Surgeon: [name], MD;  Location: [facility];  Service: Gynecology;  Laterality: N/A;  Vaginal Hysterectomy with Right Oophorectomy    Current Outpatient Rx  . Order #: [id number] Class: No Print  . Order #: [id number] Class: Print  . Order #: [id number] Class: Print  . Order #: [id number] Class: No Print  . Order #: [id number] Class: No Print  . Order #: [id number] Class: Normal  . Order #: [id number] Class: No Print  . Order #: [id number] Class: Normal  . Order #: [id number] Class: No Print  . Order #: [id number] Class: No Print  . Order #: [id number] Class: Print  . Order #: [id number] Class: Normal  . Order #: [id number] Class: Normal  . Order #: [id number] Class: Normal  .  Order #: [id number] Class: No Print  . Order #: [id number] Class: Normal    Allergies Fluoxetine hcl; Metoclopramide hcl; and Morphine and related  Family History  Problem Relation Age of Onset  . Diabetes Mother   . Hypertension Mother   . Diabetes Daughter   . Hypertension Daughter     Social History Social History  Substance Use Topics  . Smoking status: Current Some Day Smoker    Packs/day: 0.25    Types: Cigarettes  . Smokeless tobacco: Never  Used  . Alcohol use Yes     Comment: went 8 months without drinking but drank 2 today to try to feel better    Review of Systems  Constitutional: No fever/chills.  Eyes: No visual changes. ENT: No sore throat. Positive voice hoarseness.  Cardiovascular: Denies chest pain. Respiratory: Positive shortness of breath. Positive cough.  Gastrointestinal: No abdominal pain. No nausea, no vomiting.  No diarrhea.  No constipation. Genitourinary: Negative for dysuria. Musculoskeletal: Negative for back pain. Skin: Negative for rash. Neurological: Negative for headaches, focal weakness or numbness.  10-point ROS otherwise negative.  ____________________________________________   PHYSICAL EXAM:  VITAL SIGNS: ED Triage Vitals [01/28/17 1746]  Enc Vitals Group     BP (!) 128/108     Pulse Rate 108     Resp 24     Temp 97.7 F (36.5 C)     Temp Source Oral     SpO2 98 %   Constitutional: Alert and oriented. Sitting on edge of bed in no acute distress.  Eyes: Conjunctivae are normal.  Head: Atraumatic. Nose: No congestion/rhinnorhea. Mouth/Throat: Mucous membranes are moist.  Oropharynx non-erythematous. Neck: No stridor.  Cardiovascular: Tachycardia. Good peripheral circulation. Grossly normal heart sounds.   Respiratory: Normal respiratory effort.  No retractions. Lungs CTAB. Gastrointestinal: Soft and nontender. No distention.  Musculoskeletal: No lower extremity tenderness nor edema. No gross deformities of extremities. Neurologic:  Normal speech and language. No gross focal neurologic deficits are appreciated.  Skin:  Skin is warm, dry and intact. No rash noted.  ____________________________________________   LABS (all labs ordered are listed, but only abnormal results are displayed)  Labs Reviewed  COMPREHENSIVE METABOLIC PANEL - Abnormal; Notable for the following:       Result Value   Chloride 100 (*)    Glucose, Bld 121 (*)    Total Protein 8.2 (*)    AST 85 (*)      Alkaline Phosphatase 143 (*)    Total Bilirubin 1.3 (*)    All other components within normal limits  CBC WITH DIFFERENTIAL/PLATELET - Abnormal; Notable for the following:    Platelets 101 (*)    All other components within normal limits  LIPASE, BLOOD  I-STAT TROPOININ, ED   ____________________________________________  EKG   EKG Interpretation  Date/Time:  Saturday January 28 2017 17:48:25 EST Ventricular Rate:  117 PR Interval:    QRS Duration: 109 QT Interval:  344 QTC Calculation: 480 R Axis:   72 Text Interpretation:  Sinus tachycardia Probable left atrial enlargement Abnormal R-wave progression, early transition Baseline wander in lead(s) V1 No STEMI.  Confirmed by Kolette Vey MD, Celestine Prim [id number]) on 01/28/2017 6:02:29 PM Also confirmed by Costas Sena MD, Juergen Hardenbrook [phone]), editor [place]  CCT, [place] ([id number])  on 01/29/2017 8:31:27 AM       ____________________________________________  RADIOLOGY  Dg Neck Soft Tissue  Result Date: 01/28/2017 CLINICAL DATA:  Hoarseness for several weeks EXAM: NECK SOFT TISSUES - 1+ VIEW COMPARISON:  05/27/2014 FINDINGS: Seven cervical segments  are well visualized. Mild anterolisthesis of C2 on C3 and C3 on C4 is noted stable from prior CT examination. No prevertebral soft tissue changes are noted. The epiglottis and aryepiglottic folds are within normal limits. No soft tissue abnormality is seen. IMPRESSION: Degenerative changes of the cervical spine. No acute soft tissue abnormality is seen. Electronically Signed   By: Inez Catalina M.D.   On: 01/28/2017 18:55   Dg Chest 2 View  Result Date: 01/28/2017 CLINICAL DATA:  Shortness of breath. EXAM: CHEST  2 VIEW COMPARISON:  October 05, 2015 FINDINGS: The heart size and mediastinal contours are within normal limits. Both lungs are clear. The visualized skeletal structures are unremarkable. IMPRESSION: No active cardiopulmonary disease. Electronically Signed   By: Dorise Bullion III M.D   On: 01/28/2017 18:32     ____________________________________________   PROCEDURES  Procedure(s) performed:   Procedures  None ____________________________________________   INITIAL IMPRESSION / ASSESSMENT AND PLAN / ED COURSE  Pertinent labs & imaging results that were available during my care of the patient were reviewed by me and considered in my medical decision making (see chart for details).  Patient presents to the emergency room in for evaluation of difficulty breathing and hoarseness over the last several months. Significant worsening symptoms today but does note intermittent sensation of severe difficulty breathing. No chest pain. Patient continues to smoke cigarettes. She does have a notably hoarse voice with no difficulty swallowing. No stridor. Oxygen saturation is normal on room air. She does have some associated tachycardia and mild tachypnea. Chest x-ray pending. We'll add plain film of the neck with complaint of voice change over the past 2 months and significant hoarseness on my exam.   CXR and neck film are negative. Suspect bronchitis clinically. Discussed the importance of smoking cessation. Will discharge with steroid and albuterol. No abx at this time.   At this time, I do not feel there is any life-threatening condition present. I have reviewed and discussed all results (EKG, imaging, lab, urine as appropriate), exam findings with patient. I have reviewed nursing notes and appropriate previous records.  I feel the patient is safe to be discharged home without further emergent workup. Discussed usual and customary return precautions. Patient and family (if present) verbalize understanding and are comfortable with this plan.  Patient will follow-up with their primary care provider. If they do not have a primary care provider, information for follow-up has been provided to them. All questions have been answered.  ____________________________________________  FINAL CLINICAL IMPRESSION(S) /  ED DIAGNOSES  Final diagnoses:  Bronchitis     MEDICATIONS GIVEN DURING THIS VISIT:  Medications  ipratropium-albuterol (DUONEB) 0.5-2.5 (3) MG/3ML nebulizer solution 3 mL (3 mLs Nebulization Given 01/28/17 1848)     NEW OUTPATIENT MEDICATIONS STARTED DURING THIS VISIT:  Discharge Medication List as of 01/28/2017  8:49 PM    START taking these medications   Details  benzonatate (TESSALON) 100 MG capsule Take 1 capsule (100 mg total) by mouth every 8 (eight) hours., Starting Sat 01/28/2017, Print    predniSONE (DELTASONE) 10 MG tablet Take 4 tablets (40 mg total) by mouth daily., Starting Sat 01/28/2017, Until Thu 02/02/2017, Print          Note:  This document was prepared using Dragon voice recognition software and may include unintentional dictation errors.  Nanda Quinton, MD Emergency Medicine   Margette Fast, MD 01/29/17 [phone]

## 2017-01-28 NOTE — ED Notes (Signed)
Delay in cardiac monitoring, Pt in xray.

## 2017-02-15 ENCOUNTER — Encounter (HOSPITAL_COMMUNITY): Payer: Self-pay | Admitting: *Deleted

## 2017-02-15 ENCOUNTER — Emergency Department (HOSPITAL_COMMUNITY)
Admission: EM | Admit: 2017-02-15 | Discharge: 2017-02-16 | Disposition: A | Discharge status: Home / Self Care | Payer: Medicare Other | Attending: Emergency Medicine | Admitting: Emergency Medicine

## 2017-02-15 ENCOUNTER — Emergency Department (HOSPITAL_COMMUNITY): Payer: Medicare Other

## 2017-02-15 DIAGNOSIS — R930 Abnormal findings on diagnostic imaging of skull and head, not elsewhere classified: Secondary | ICD-10-CM | POA: Insufficient documentation

## 2017-02-15 DIAGNOSIS — F10129 Alcohol abuse with intoxication, unspecified: Secondary | ICD-10-CM | POA: Diagnosis present

## 2017-02-15 DIAGNOSIS — I10 Essential (primary) hypertension: Secondary | ICD-10-CM | POA: Diagnosis not present

## 2017-02-15 DIAGNOSIS — E039 Hypothyroidism, unspecified: Secondary | ICD-10-CM | POA: Insufficient documentation

## 2017-02-15 DIAGNOSIS — F1910 Other psychoactive substance abuse, uncomplicated: Secondary | ICD-10-CM

## 2017-02-15 DIAGNOSIS — F1420 Cocaine dependence, uncomplicated: Secondary | ICD-10-CM | POA: Diagnosis not present

## 2017-02-15 DIAGNOSIS — F1310 Sedative, hypnotic or anxiolytic abuse, uncomplicated: Secondary | ICD-10-CM

## 2017-02-15 DIAGNOSIS — F1320 Sedative, hypnotic or anxiolytic dependence, uncomplicated: Secondary | ICD-10-CM | POA: Insufficient documentation

## 2017-02-15 DIAGNOSIS — F17210 Nicotine dependence, cigarettes, uncomplicated: Secondary | ICD-10-CM | POA: Insufficient documentation

## 2017-02-15 DIAGNOSIS — F1994 Other psychoactive substance use, unspecified with psychoactive substance-induced mood disorder: Secondary | ICD-10-CM | POA: Diagnosis present

## 2017-02-15 DIAGNOSIS — Z79899 Other long term (current) drug therapy: Secondary | ICD-10-CM | POA: Insufficient documentation

## 2017-02-15 LAB — I-STAT CHEM 8, ED
BUN: 21 mg/dL — ABNORMAL HIGH (ref 6–20)
CALCIUM ION: 1.13 mmol/L — AB (ref 1.15–1.40)
CREATININE: 0.9 mg/dL (ref 0.44–1.00)
Chloride: 106 mmol/L (ref 101–111)
GLUCOSE: 141 mg/dL — AB (ref 65–99)
HCT: 40 % (ref 36.0–46.0)
HEMOGLOBIN: 13.6 g/dL (ref 12.0–15.0)
Potassium: 3.4 mmol/L — ABNORMAL LOW (ref 3.5–5.1)
Sodium: 147 mmol/L — ABNORMAL HIGH (ref 135–145)
TCO2: 26 mmol/L (ref 0–100)

## 2017-02-15 LAB — RAPID URINE DRUG SCREEN, HOSP PERFORMED
Amphetamines: NOT DETECTED
BARBITURATES: NOT DETECTED
BENZODIAZEPINES: POSITIVE — AB
COCAINE: POSITIVE — AB
Opiates: NOT DETECTED
TETRAHYDROCANNABINOL: POSITIVE — AB

## 2017-02-15 LAB — ACETAMINOPHEN LEVEL: Acetaminophen (Tylenol), Serum: <10 ug/mL — ABNORMAL LOW (ref 10–30)

## 2017-02-15 LAB — I-STAT BETA HCG BLOOD, ED (MC, WL, AP ONLY)

## 2017-02-15 LAB — ETHANOL

## 2017-02-15 MED ORDER — SODIUM CHLORIDE 0.9 % IV BOLUS (SEPSIS)
1000.0000 mL | Freq: Once | INTRAVENOUS | Status: AC
  Administered 2017-02-15: 1000 mL via INTRAVENOUS

## 2017-02-15 NOTE — ED Provider Notes (Signed)
[facility] DEPT Provider Note   CSN: [id number] Arrival date & time: 02/15/17  1655     History   Chief Complaint Chief Complaint  Patient presents with  . Ingestion  . Alcohol Intoxication    HPI Erin [name] is a 51 y.o. female.  The history is provided by the patient.  Drug / Alcohol Assessment  Primary symptoms include somnolence, intoxication. This is a new problem. The current episode started 6 to 12 hours ago. The problem has not changed since onset.Suspected agents include cocaine and prescription drugs. Pertinent negatives include no fever, no injury, no nausea, no vomiting, no bladder incontinence and no bowel incontinence.    Past Medical History:  Diagnosis Date  . Anxiety   . Bipolar 1 disorder ([place])   . Blood transfusion July 2012  . Bronchitis   . Cirrhosis of liver ([place])   . Depression   . History of sarcoidosis   . Hypothyroidism   . Liver failure ([place])   . Menorrhagia   . Wegener's granulomatosis [facility])     Patient Active Problem List   Diagnosis Date Noted  . Alcohol use disorder, severe, dependence ([place]) 01/12/2016  . Cocaine dependence with cocaine-induced mood disorder ([place])   . Opioid dependence with withdrawal ([place])   . Bipolar disorder ([place]) 02/09/2015  . Alcohol dependence with alcohol-induced mood disorder ([place])   . Bipolar affective disorder, depressed, severe ([place]) 02/06/2015  . Suicidal ideations   . Opioid dependence ([place]) 05/06/2014  . Cocaine dependence ([place]) 07/17/2013  . Thrombocytopenia ([place]) 01/06/2013  . Sarcoidosis ([place]) 03/30/2010  . HYPERTENSION, BENIGN ESSENTIAL 01/11/2010  . HYPOTHYROIDISM 03/02/2009  . ALCOHOLIC LIVER DISEASE 0000000  . CIRRHOSIS, ALCOHOLIC, LIVER 0000000  . TOBACCO DEPENDENCE 02/22/2007  . EXTERNAL HEMORRHOIDS 01/11/2005  . PORTAL HYPERTENSION 01/11/2005    Past Surgical History:  Procedure Laterality Date  . CHOLECYSTECTOMY    . FRACTURE SURGERY    . HERNIA REPAIR    .  LAPAROSCOPY    . LAPAROTOMY  07/28/2011   Procedure: EXPLORATORY LAPAROTOMY;  Surgeon: [name], MD;  Location: [facility];  Service: Gynecology;  Laterality: N/A;  . ORTHOPEDIC SURGERY    . TUBAL LIGATION    . VAGINAL HYSTERECTOMY  07/27/2011   Procedure: HYSTERECTOMY VAGINAL;  Surgeon: [name], MD;  Location: [facility];  Service: Gynecology;  Laterality: N/A;  Vaginal Hysterectomy with Right Oophorectomy    OB History    Gravida Para Term Preterm AB Living   4 3 3  0 1 3   SAB TAB Ectopic Multiple Live Births                   Home Medications    Prior to Admission medications   Medication Sig Start Date End Date Taking? Authorizing Provider  albuterol (PROVENTIL HFA;VENTOLIN HFA) 108 (90 Base) MCG/ACT inhaler Inhale 1-2 puffs into the lungs every 6 (six) hours as needed for wheezing. 01/26/16 01/25/17  Encarnacion Slates, NP  albuterol (PROVENTIL HFA;VENTOLIN HFA) 108 (90 Base) MCG/ACT inhaler Inhale 2 puffs into the lungs every 4 (four) hours as needed for wheezing or shortness of breath. 01/28/17   Margette Fast, MD  benzonatate (TESSALON) 100 MG capsule Take 1 capsule (100 mg total) by mouth every 8 (eight) hours. 01/28/17   Margette Fast, MD  carisoprodol (SOMA) 350 MG tablet Take 1 tablet three times daily: For muscle cramps 01/26/16   Encarnacion Slates, NP  docusate sodium (COLACE) 100 MG capsule Take  1 capsule (100 mg total) by mouth 2 (two) times daily. (This is an over the counter medicine): For constipation 01/26/16   Encarnacion Slates, NP  DULoxetine (CYMBALTA) 60 MG capsule Take 1 tablet (60 mg) daily: For depression/muscle pain 01/26/16   Encarnacion Slates, NP  furosemide (LASIX) 20 MG tablet Take 1 tablet (20 mg total) by mouth daily. For swellings 01/26/16   Encarnacion Slates, NP  gabapentin (NEURONTIN) 300 MG capsule Take 1 tablet (300 mg) four times daily: For agitation/pain 01/26/16   Encarnacion Slates, NP  hydrochlorothiazide (HYDRODIURIL) 25 MG tablet Take 1 tablet (25 mg total) by mouth daily. For  high blood pressure 01/26/16   Encarnacion Slates, NP  loratadine (CLARITIN) 10 MG tablet Take 1 tablet (10 mg total) by mouth daily. (may purchase from over the counter at yr local pharmacy): For allergies 01/26/16   Encarnacion Slates, NP  propranolol (INDERAL) 20 MG tablet Take 1 tablet (20 mg total) by mouth 2 (two) times daily. For anxiety 01/26/16   Encarnacion Slates, NP  QUEtiapine (SEROQUEL) 200 MG tablet Take 1 tablet (200 mg total) by mouth at bedtime. 01/26/16   Encarnacion Slates, NP  QUEtiapine (SEROQUEL) 50 MG tablet Take 1 tablet (50 mg total) by mouth 3 (three) times daily. For agitation 01/26/16   Encarnacion Slates, NP  thyroid (ARMOUR) 30 MG tablet Take 1 tablet (30 mg total) by mouth daily before breakfast. For low thyroid function 01/26/16   Encarnacion Slates, NP  traZODone (DESYREL) 50 MG tablet Take 1 tablet (25 mg) three times daily & 2 tablets (50 mg) at bedtime: For depression/insomnia 01/26/16   Encarnacion Slates, NP    Family History Family History  Problem Relation Age of Onset  . Diabetes Mother   . Hypertension Mother   . Diabetes Daughter   . Hypertension Daughter     Social History Social History  Substance Use Topics  . Smoking status: Current Some Day Smoker    Packs/day: 0.25    Types: Cigarettes  . Smokeless tobacco: Never Used  . Alcohol use Yes     Comment: went 8 months without drinking but drank 2 today to try to feel better     Allergies   Fluoxetine hcl; Metoclopramide hcl; and Morphine and related   Review of Systems Review of Systems  Constitutional: Negative for fever.  Gastrointestinal: Negative for bowel incontinence, nausea and vomiting.  Genitourinary: Negative for bladder incontinence.  All other systems reviewed and are negative.    Physical Exam Updated Vital Signs BP 147/90   Pulse (!) 125   Resp 17   Ht 5\' 7"  (1.702 m)   Wt 235 lb (106.6 kg)   SpO2 98%   BMI 36.81 kg/m   Physical Exam  Constitutional: She is oriented to person, place, and time.  She appears well-developed and well-nourished. No distress.  HENT:  Head: Normocephalic.  Nose: Nose normal.  Eyes: Conjunctivae are normal.  Neck: Neck supple. No tracheal deviation present.  Cardiovascular: Normal rate, regular rhythm and normal heart sounds.   Pulmonary/Chest: Effort normal and breath sounds normal. No respiratory distress.  Abdominal: Soft. She exhibits no distension. There is no tenderness. There is no rebound and no guarding.  Neurological: She is alert and oriented to person, place, and time. No cranial nerve deficit.  Mydriasis, patient with slurred and stuttering speech, difficulty with balance on ambulation.   Skin: Skin is warm and dry.  Psychiatric:  Her affect is blunt. Her speech is slurred. She is slowed.     ED Treatments / Results  Labs (all labs ordered are listed, but only abnormal results are displayed) Labs Reviewed  ACETAMINOPHEN LEVEL - Abnormal; Notable for the following:       Result Value   Acetaminophen (Tylenol), Serum <10 (*)    All other components within normal limits  RAPID URINE DRUG SCREEN, HOSP PERFORMED - Abnormal; Notable for the following:    Cocaine POSITIVE (*)    Benzodiazepines POSITIVE (*)    Tetrahydrocannabinol POSITIVE (*)    All other components within normal limits  I-STAT CHEM 8, ED - Abnormal; Notable for the following:    Sodium 147 (*)    Potassium 3.4 (*)    BUN 21 (*)    Glucose, Bld 141 (*)    Calcium, Ion 1.13 (*)    All other components within normal limits  ETHANOL  I-STAT BETA HCG BLOOD, ED ([facility], [facility], [facility] ONLY)    EKG  EKG Interpretation  Date/Time:  Wednesday February 15 2017 17:26:13 EST Ventricular Rate:  126 PR Interval:    QRS Duration: 90 QT Interval:  322 QTC Calculation: 467 R Axis:   70 Text Interpretation:  Sinus tachycardia Otherwise normal ECG No significant change since last tracing Confirmed by Royale Lennartz MD, Aarush Stukey [id number]) on 02/15/2017 5:31:52 PM       Radiology Ct Head Wo  Contrast  Result Date: 02/15/2017 CLINICAL DATA:  Combination of alcohol with prescription medication. Mental status changes. EXAM: CT HEAD WITHOUT CONTRAST TECHNIQUE: Contiguous axial images were obtained from the base of the skull through the vertex without intravenous contrast. COMPARISON:  05/27/2014. FINDINGS: Brain: There is no evidence for acute hemorrhage, hydrocephalus, mass lesion, or abnormal extra-axial fluid collection. No definite CT evidence for acute infarction. Vascular: No hyperdense vessel or unexpected calcification. Skull: No evidence for fracture. No worrisome lytic or sclerotic lesion. Sinuses/Orbits: The visualized paranasal sinuses and mastoid air cells are clear. Old right medial orbital wall blowout fracture noted. Other: None. IMPRESSION: Stable.  No acute intracranial abnormality. Electronically Signed   By: Misty Stanley M.D.   On: 02/15/2017 20:21    Procedures Procedures (including critical care time)  Medications Ordered in ED Medications  sodium chloride 0.9 % bolus 1,000 mL (0 mLs Intravenous Stopped 02/15/17 1917)     Initial Impression / Assessment and Plan / ED Course  I have reviewed the triage vital signs and the nursing notes.  Pertinent labs & imaging results that were available during my care of the patient were reviewed by me and considered in my medical decision making (see chart for details).     51 year old female presents with episode of apparent acute intoxication on prescription medications that she states that she took purposefully. She states that her friends were concerned that she was going to try and commit suicide but she denies taking the medications with the intent to harm herself. She has dilated pupils, has slowed and slurred speech and has an imbalanced gait. CT head is negative and no evidence of coingestions. Ethanol level was not elevated. I suspect she has taken Seroquel and Flexeril at a sublethal dose with the intent to cause  acute intoxication. She has continued to deny suicidal ideation here. After a period of asking to leave repeatedly the patient fell asleep.   Patient will need to metabolize her ingestion and will need reassessment to determine if she remains without suicidal thoughts and can be  safe for discharge or defer to psychiatry disposition if she endorses suicidal ideation.  Final Clinical Impressions(s) / ED Diagnoses   Final diagnoses:  Polysubstance abuse    New Prescriptions New Prescriptions   No medications on file     Leo Grosser, MD 02/16/17 [id number]

## 2017-02-15 NOTE — ED Triage Notes (Signed)
Per EMS, pt drank a pint of vodka, took 3 Seroquel. Pt may have taken an unknown amount of carisoprodol, which were on the floor. Pt was confused with EMS, is alert and oriented to person and time.   Pt's boyfriend died 3 days ago. Friend said the pt called this afternoon and told her she was going to kill herself. Pt denied SI to EMS.

## 2017-02-15 NOTE — ED Notes (Signed)
Patient states "I have been drinking and alcohol and I took my Seroquel and muscle relaxers too. My friends came over and saw me and thought I was suicidal, but I am not. I know what I was doing and I wasn't trying to hurt myself." Speech is dishelved and slurred. She is A&O X4.

## 2017-02-15 NOTE — ED Notes (Signed)
Patient is A&O x4. She ambulated to bathroom with assistance. Gait slightly  unsteady. She tolerated her meal without complications.

## 2017-02-15 NOTE — ED Notes (Signed)
Bed: WA18 Expected date:  Expected time:  Means of arrival:  Comments: ResA 

## 2017-02-15 NOTE — ED Notes (Signed)
Bed: RESA Expected date:  Expected time:  Means of arrival:  Comments: 51 yo f etoh 3-4 seroquel, confused

## 2017-02-15 NOTE — ED Notes (Signed)
Patient is A&O x4. Tremors present but she states it is baseline. Requesting something to eat. Meal given.

## 2017-02-16 DIAGNOSIS — F1320 Sedative, hypnotic or anxiolytic dependence, uncomplicated: Secondary | ICD-10-CM | POA: Diagnosis not present

## 2017-02-16 DIAGNOSIS — F1310 Sedative, hypnotic or anxiolytic abuse, uncomplicated: Secondary | ICD-10-CM | POA: Diagnosis present

## 2017-02-16 DIAGNOSIS — F1994 Other psychoactive substance use, unspecified with psychoactive substance-induced mood disorder: Secondary | ICD-10-CM | POA: Diagnosis present

## 2017-02-16 MED ORDER — IBUPROFEN 200 MG PO TABS: 600.0000 mg | ORAL_TABLET | Freq: Three times a day (TID) | ORAL | Status: DC | PRN

## 2017-02-16 MED ORDER — ONDANSETRON HCL 4 MG PO TABS: 4.0000 mg | ORAL_TABLET | Freq: Three times a day (TID) | ORAL | Status: DC | PRN

## 2017-02-16 MED ORDER — ALUM & MAG HYDROXIDE-SIMETH 200-200-20 MG/5ML PO SUSP: 30.0000 mL | ORAL | Status: DC | PRN

## 2017-02-16 MED ORDER — ACETAMINOPHEN 325 MG PO TABS: 650.0000 mg | ORAL_TABLET | ORAL | Status: DC | PRN

## 2017-02-16 NOTE — Discharge Instructions (Signed)
For your ongoing behavioral health needs, you are advised to follow up with [facility].  Contact them at your earliest opportunity to ask about scheduling an intake appointment: ° °     [facility] °     [address] °     [place], [place] [address] °     [phone] °

## 2017-02-16 NOTE — ED Notes (Signed)
Patient pleasant and cooperative on admit to the unit.  She was oriented to surroundings.  She is to be discharged later today, but states she does not have the keys to her house.  She states she believes her friend was at her house when the EMS arrived.  She will attempt to get reach her by phone today for a ride home and to get into her home when she gets there.
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
## 2017-02-16 NOTE — ED Notes (Signed)
TTS at bedside. 

## 2017-02-16 NOTE — BHH Suicide Risk Assessment (Signed)
Suicide Risk Assessment  Discharge Assessment   [facility] Discharge Suicide Risk Assessment   Principal Problem: Substance induced mood disorder [facility]) Discharge Diagnoses:  Patient Active Problem List   Diagnosis Date Noted  . Benzodiazepine abuse [F13.10] 02/16/2017    Priority: High  . Substance induced mood disorder ([place]) [F19.94] 02/16/2017    Priority: High  . Cocaine dependence ([place]) [F14.20] 07/17/2013    Priority: High  . Alcohol use disorder, severe, dependence ([place]) [F10.20] 01/12/2016  . Cocaine dependence with cocaine-induced mood disorder ([place]) [F14.24]   . Opioid dependence with withdrawal ([place]) [F11.23]   . Bipolar disorder ([place]) [F31.9] 02/09/2015  . Alcohol dependence with alcohol-induced mood disorder ([place]) [F10.24]   . Suicidal ideations [R45.851]   . Opioid dependence ([place]) [F11.20] 05/06/2014  . Thrombocytopenia ([place]) [D69.6] 01/06/2013  . Sarcoidosis ([place]) [D86.9] 03/30/2010  . HYPERTENSION, BENIGN ESSENTIAL [I10] 01/11/2010  . HYPOTHYROIDISM [E03.9] 03/02/2009  . ALCOHOLIC LIVER DISEASE [id number] 03/06/2008  . CIRRHOSIS, ALCOHOLIC, LIVER [id number]  . TOBACCO DEPENDENCE [F17.200] 02/22/2007  . EXTERNAL HEMORRHOIDS [K64.4] 01/11/2005  . PORTAL HYPERTENSION [K76.6] 01/11/2005    Total Time spent with patient: 45 minutes  Musculoskeletal: Strength & Muscle Tone: within normal limits Gait & Station: normal Patient leans: N/A  Psychiatric Specialty Exam:   Blood pressure 132/81, pulse 91, temperature 98.6 F (37 C), temperature source Oral, resp. rate 20, height 5\' 7"  (1.702 m), weight 106.6 kg (235 lb), SpO2 95 %.Body mass index is 36.81 kg/m.  General Appearance: Casual  Eye Contact::  Good  Speech:  Normal Rate409  Volume:  Normal  Mood:  Anxious,mild  Affect:  Congruent  Thought Process:  Coherent and Descriptions of Associations: Intact  Orientation:  Full (Time, Place, and Person)  Thought Content:  WDL and Logical  Suicidal Thoughts:  No   Homicidal Thoughts:  No  Memory:  Immediate;   Good Recent;   Fair Remote;   Good  Judgement:  Fair  Insight:  Fair  Psychomotor Activity:  Normal  Concentration:  Good  Recall:  Good  Fund of Knowledge:Fair  Language: Good  Akathisia:  No  Handed:  Right  AIMS (if indicated):     Assets:  Housing Leisure Time Physical Health Resilience Social Support  Sleep:     Cognition: WNL  ADL's:  Intact   Mental Status Per Nursing Assessment::   On Admission:   substance abuse with suicidal ideations prior to the police coming to her house.  She does not remember this as she was using Xanax off the street and cocaine.  Continues to deny suicidal/homicidal ideations, hallucinations, or withdrawal symptoms.  Demographic Factors:  Caucasian  Loss Factors: NA  Historical Factors: NA  Risk Reduction Factors:   Sense of responsibility to family, Living with another person, especially a relative and Positive social support  Continued Clinical Symptoms:  Anxiety, mild  Cognitive Features That Contribute To Risk:  None    Suicide Risk:  Minimal: No identifiable suicidal ideation.  Patients presenting with no risk factors but with morbid ruminations; may be classified as minimal risk based on the severity of the depressive symptoms    Plan Of Care/Follow-up recommendations:  Activity:  as tolerated Diet:  heart healthy diet  LORD, JAMISON, NP 02/16/2017, 9:38 AM

## 2017-02-16 NOTE — ED Notes (Signed)
Patient discharged to home.  She denies thoughts of harm to self or others and states she is looking forward to getting back to her dog.  Discharge instructions reviewed and patient verbalized understanding of all.  All belongings were returned and signed for.  She left the unit ambulatory and was escorted to the front lobby.  Her friend will be coming to pick her up and she wanted to wait out front for her.

## 2017-02-16 NOTE — BH Assessment (Signed)
[facility] Assessment Progress Note  Per Corena Pilgrim, MD, this pt does not require psychiatric hospitalization at this time.  Pt is to be discharged from [facility] with recommendation to follow up with the [facility].  This has been included in pt's discharge instructions.  Pt's nurse, Dawnaly, has been notified.  Jalene Mullet, [facility] Triage Specialist [phone]

## 2017-06-23 ENCOUNTER — Ambulatory Visit: Payer: Self-pay | Admitting: Emergency Medicine

## 2017-08-29 ENCOUNTER — Encounter (HOSPITAL_COMMUNITY): Payer: Self-pay | Admitting: *Deleted

## 2017-08-29 ENCOUNTER — Ambulatory Visit (HOSPITAL_COMMUNITY)
Admission: EM | Admit: 2017-08-29 | Discharge: 2017-08-29 | Disposition: A | Discharge status: Home / Self Care | Payer: Medicare Other | Attending: Family Medicine | Admitting: Family Medicine

## 2017-08-29 DIAGNOSIS — M5432 Sciatica, left side: Secondary | ICD-10-CM | POA: Diagnosis not present

## 2017-08-29 MED ORDER — METHYLPREDNISOLONE SODIUM SUCC 125 MG IJ SOLR
125.0000 mg | Freq: Once | INTRAMUSCULAR | Status: AC
  Administered 2017-08-29: 125 mg via INTRAMUSCULAR

## 2017-08-29 MED ORDER — METHYLPREDNISOLONE SODIUM SUCC 125 MG IJ SOLR
INTRAMUSCULAR | Status: AC
  Filled 2017-08-29: qty 2

## 2017-08-29 MED ORDER — HYDROCODONE-ACETAMINOPHEN 5-325 MG PO TABS: 1.0000 | ORAL_TABLET | Freq: Four times a day (QID) | ORAL | 0 refills | Status: DC | PRN

## 2017-08-29 MED ORDER — DICLOFENAC SODIUM 75 MG PO TBEC: 75.0000 mg | DELAYED_RELEASE_TABLET | Freq: Two times a day (BID) | ORAL | 0 refills | Status: DC

## 2017-08-29 NOTE — Discharge Instructions (Addendum)
Please call and schedule an appointment to establish care with PCP. You may follow up here if needed. Be aware, pain medications can make you drowsy. Do not drive or operate machinery while taking.

## 2017-08-29 NOTE — ED Provider Notes (Signed)
[facility]   [id number] 08/29/17 Arrival Time: 1004  ASSESSMENT & PLAN:  1. Sciatica of left side    Meds ordered this encounter  Medications  . diclofenac (VOLTAREN) 75 MG EC tablet    Sig: Take 1 tablet (75 mg total) by mouth 2 (two) times daily.    Dispense:  14 tablet    Refill:  0  . HYDROcodone-acetaminophen (NORCO/VICODIN) 5-325 MG tablet    Sig: Take 1 tablet by mouth every 6 (six) hours as needed for moderate pain or severe pain.    Dispense:  10 tablet    Refill:  0  . methylPREDNISolone sodium succinate (SOLU-MEDROL) 125 mg/2 mL injection 125 mg   Ensure proper mobility. To establish with PCP. Information given. If symptoms continue she may benefit from orthopaedic and/or physical therapy evaluation. Medication sedation precautions. Reviewed expectations re: course of current medical issues. Questions answered. Outlined signs and symptoms indicating need for more acute intervention. Patient verbalized understanding. After Visit Summary given.   SUBJECTIVE:  Erin [name] is a 51 y.o. female who presents with complaint of pain in left lower back/buttock radiating down left posterior leg. Symptoms with gradual onset over the past 3-4 days. Symptoms exacerbated by prolonged sitting or standing. No specific alleviating factors reported. No injury or trauma. Lifting sick mother frequently and questions relation. No LE weakness reported. Occasional "numbness" in her foot. Trouble sleeping secondary to symptoms. No change in bowel/bladder habits. No h/o similar pain. OTC prn ibuprofen without much help. No h/o back surgery.  ROS: As per HPI. All other systems negative.   OBJECTIVE:  Vitals:   08/29/17 1040  BP: (!) 152/95  Pulse: 78  Resp: 18  Temp: 98.6 F (37 C)  TempSrc: Oral  SpO2: 100%    General appearance: alert; no distress Lungs: clear to auscultation bilaterally Heart: regular rate and rhythm Abdomen: soft, non-tender; bowel sounds  normal; no masses or organomegaly; no guarding or rebound tenderness Back: no midline tenderness; limited ROM at hips secondary to reported discomfort Extremities: SLR on L with reproduction of buttock/thigh pain; no cyanosis or edema; symmetrical with no gross deformities Skin: warm and dry Neurologic: normal gait; normal symmetric reflexes of lower extremities; normal strength and sensation of lower extremities Psychological: alert and cooperative; normal mood and affect   Allergies  Allergen Reactions  . Fluoxetine Hcl Other (See Comments)     hyperactivity  . Metoclopramide Hcl Other (See Comments)    depression  . Morphine And Related Other (See Comments)    High doses cause hallucinations    Past Medical History:  Diagnosis Date  . Anxiety   . Bipolar 1 disorder ([place])   . Blood transfusion July 2012  . Bronchitis   . Cirrhosis of liver ([place])   . Depression   . History of sarcoidosis   . Hypothyroidism   . Liver failure ([place])   . Menorrhagia   . Wegener's granulomatosis [facility])    Social History   Social History  . Marital status: Divorced    Spouse name: N/A  . Number of children: N/A  . Years of education: N/A   Occupational History  . Not on file.   Social History Main Topics  . Smoking status: Current Some Day Smoker    Packs/day: 0.25    Types: Cigarettes  . Smokeless tobacco: Never Used  . Alcohol use Yes     Comment: went 8 months without drinking but drank 2 today to try to feel  better  . Drug use: Yes    Types: Marijuana  . Sexual activity: No   Other Topics Concern  . Not on file   Social History Narrative  . No narrative on file   Family History  Problem Relation Age of Onset  . Diabetes Mother   . Hypertension Mother   . Diabetes Daughter   . Hypertension Daughter    Past Surgical History:  Procedure Laterality Date  . CHOLECYSTECTOMY    . FRACTURE SURGERY    . HERNIA REPAIR    . LAPAROSCOPY    . LAPAROTOMY  07/28/2011    Procedure: EXPLORATORY LAPAROTOMY;  Surgeon: [name], MD;  Location: [facility];  Service: Gynecology;  Laterality: N/A;  . ORTHOPEDIC SURGERY    . TUBAL LIGATION    . VAGINAL HYSTERECTOMY  07/27/2011   Procedure: HYSTERECTOMY VAGINAL;  Surgeon: [name], MD;  Location: [facility];  Service: Gynecology;  Laterality: N/A;  Vaginal Hysterectomy with Right Oophorectomy     Vanessa Kick, MD 08/29/17 1102

## 2017-08-29 NOTE — ED Triage Notes (Signed)
Pt  Reports     Low  Back  Pain  Radiating   Down l  Leg     X  1  Days  Pt  Reports  Has  Been taking care  Off   Sick  Mother  Recently

## 2017-09-26 ENCOUNTER — Encounter: Payer: Self-pay | Admitting: Family Medicine

## 2017-09-26 ENCOUNTER — Ambulatory Visit (INDEPENDENT_AMBULATORY_CARE_PROVIDER_SITE_OTHER): Payer: Medicare Other | Admitting: Family Medicine

## 2017-09-26 VITALS — BP 125/88 | HR 89 | Temp 98.6°F | Resp 16 | Ht 67.0 in | Wt 238.0 lb

## 2017-09-26 DIAGNOSIS — I10 Essential (primary) hypertension: Secondary | ICD-10-CM

## 2017-09-26 DIAGNOSIS — M25552 Pain in left hip: Secondary | ICD-10-CM

## 2017-09-26 DIAGNOSIS — F339 Major depressive disorder, recurrent, unspecified: Secondary | ICD-10-CM | POA: Diagnosis not present

## 2017-09-26 DIAGNOSIS — G4700 Insomnia, unspecified: Secondary | ICD-10-CM

## 2017-09-26 DIAGNOSIS — Z23 Encounter for immunization: Secondary | ICD-10-CM

## 2017-09-26 DIAGNOSIS — K7030 Alcoholic cirrhosis of liver without ascites: Secondary | ICD-10-CM | POA: Diagnosis not present

## 2017-09-26 LAB — POCT URINALYSIS DIP (DEVICE)
Glucose, UA: NEGATIVE mg/dL
HGB URINE DIPSTICK: NEGATIVE
Ketones, ur: NEGATIVE mg/dL
LEUKOCYTES UA: NEGATIVE
NITRITE: NEGATIVE
Protein, ur: NEGATIVE mg/dL
Specific Gravity, Urine: 1.02 (ref 1.005–1.030)
Urobilinogen, UA: 1 mg/dL (ref 0.0–1.0)
pH: 6.5 (ref 5.0–8.0)

## 2017-09-26 MED ORDER — HYDROCHLOROTHIAZIDE 25 MG PO TABS: 25.0000 mg | ORAL_TABLET | Freq: Every day | ORAL | Status: DC

## 2017-09-26 MED ORDER — TRAZODONE HCL 50 MG PO TABS: ORAL_TABLET | ORAL | 0 refills | Status: DC

## 2017-09-26 NOTE — Addendum Note (Signed)
Addended by: Scot Jun on: 09/26/2017 03:03 PM   Modules accepted: Orders

## 2017-09-26 NOTE — Progress Notes (Signed)
Patient ID: Erin [name], female    DOB: 1966/04/11, 51 y.o.   MRN: [id number]  PCP: Scot Jun, FNP     Subjective:  HPI Erin [name] is a 51 y.o. female presents for evaluation of No chief complaint on file.  Sarcosisisi, brain damage  Social History   Social History  . Marital status: Divorced    Spouse name: N/A  . Number of children: N/A  . Years of education: N/A   Occupational History  . Not on file.   Social History Main Topics  . Smoking status: Current Some Day Smoker    Packs/day: 0.25    Types: Cigarettes  . Smokeless tobacco: Never Used  . Alcohol use Yes     Comment: went 8 months without drinking but drank 2 today to try to feel better  . Drug use: Yes    Types: Marijuana  . Sexual activity: No   Other Topics Concern  . Not on file   Social History Narrative  . No narrative on file    Family History  Problem Relation Age of Onset  . Diabetes Mother   . Hypertension Mother   . Diabetes Daughter   . Hypertension Daughter      Review of Systems  Patient Active Problem List   Diagnosis Date Noted  . Benzodiazepine abuse ([place]) 02/16/2017  . Substance induced mood disorder ([place]) 02/16/2017  . Alcohol use disorder, severe, dependence ([place]) 01/12/2016  . Cocaine dependence with cocaine-induced mood disorder ([place])   . Opioid dependence with withdrawal ([place])   . Bipolar disorder ([place]) 02/09/2015  . Alcohol dependence with alcohol-induced mood disorder ([place])   . Suicidal ideations   . Opioid dependence ([place]) 05/06/2014  . Cocaine dependence ([place]) 07/17/2013  . Thrombocytopenia ([place]) 01/06/2013  . Sarcoidosis 03/30/2010  . HYPERTENSION, BENIGN ESSENTIAL 01/11/2010  . HYPOTHYROIDISM 03/02/2009  . ALCOHOLIC LIVER DISEASE 40/98/1191  . CIRRHOSIS, ALCOHOLIC, LIVER 47/82/9562  . TOBACCO DEPENDENCE 02/22/2007  . EXTERNAL HEMORRHOIDS 01/11/2005  . PORTAL HYPERTENSION 01/11/2005    Allergies  Allergen Reactions  . Fluoxetine  Hcl Other (See Comments)     hyperactivity  . Metoclopramide Hcl Other (See Comments)    depression  . Morphine And Related Other (See Comments)    High doses cause hallucinations    Prior to Admission medications   Medication Sig Start Date End Date Taking? Authorizing Provider  albuterol (PROVENTIL HFA;VENTOLIN HFA) 108 (90 Base) MCG/ACT inhaler Inhale 1-2 puffs into the lungs every 6 (six) hours as needed for wheezing. 01/26/16 02/16/17  Lindell Spar I, NP  albuterol (PROVENTIL HFA;VENTOLIN HFA) 108 (90 Base) MCG/ACT inhaler Inhale 2 puffs into the lungs every 4 (four) hours as needed for wheezing or shortness of breath. Patient not taking: Reported on 09/26/2017 01/28/17   Long, Wonda Olds, MD  benzonatate (TESSALON) 100 MG capsule Take 1 capsule (100 mg total) by mouth every 8 (eight) hours. Patient not taking: Reported on 02/16/2017 01/28/17   Margette Fast, MD  carisoprodol (SOMA) 350 MG tablet Take 1 tablet three times daily: For muscle cramps Patient not taking: Reported on 09/26/2017 01/26/16   Lindell Spar I, NP  diclofenac (VOLTAREN) 75 MG EC tablet Take 1 tablet (75 mg total) by mouth 2 (two) times daily. 08/29/17   Vanessa Kick, MD  docusate sodium (COLACE) 100 MG capsule Take 1 capsule (100 mg total) by mouth 2 (two) times daily. (This is an over the counter medicine): For constipation Patient not taking:  Reported on 09/26/2017 01/26/16   Lindell Spar I, NP  DULoxetine (CYMBALTA) 60 MG capsule Take 1 tablet (60 mg) daily: For depression/muscle pain Patient not taking: Reported on 09/26/2017 01/26/16   Lindell Spar I, NP  furosemide (LASIX) 20 MG tablet Take 1 tablet (20 mg total) by mouth daily. For swellings Patient not taking: Reported on 09/26/2017 01/26/16   Lindell Spar I, NP  gabapentin (NEURONTIN) 300 MG capsule Take 1 tablet (300 mg) four times daily: For agitation/pain Patient not taking: Reported on 09/26/2017 01/26/16   Lindell Spar I, NP  hydrochlorothiazide (HYDRODIURIL) 25 MG  tablet Take 1 tablet (25 mg total) by mouth daily. For high blood pressure Patient not taking: Reported on 09/26/2017 01/26/16   Lindell Spar I, NP  HYDROcodone-acetaminophen (NORCO/VICODIN) 5-325 MG tablet Take 1 tablet by mouth every 6 (six) hours as needed for moderate pain or severe pain. 08/29/17   Vanessa Kick, MD  loratadine (CLARITIN) 10 MG tablet Take 1 tablet (10 mg total) by mouth daily. (may purchase from over the counter at yr local pharmacy): For allergies Patient not taking: Reported on 02/16/2017 01/26/16   Lindell Spar I, NP  propranolol (INDERAL) 20 MG tablet Take 1 tablet (20 mg total) by mouth 2 (two) times daily. For anxiety Patient not taking: Reported on 02/16/2017 01/26/16   Lindell Spar I, NP  QUEtiapine (SEROQUEL) 200 MG tablet Take 1 tablet (200 mg total) by mouth at bedtime. Patient not taking: Reported on 09/26/2017 01/26/16   Lindell Spar I, NP  QUEtiapine (SEROQUEL) 50 MG tablet Take 1 tablet (50 mg total) by mouth 3 (three) times daily. For agitation Patient not taking: Reported on 09/26/2017 01/26/16   Lindell Spar I, NP  thyroid (ARMOUR) 30 MG tablet Take 1 tablet (30 mg total) by mouth daily before breakfast. For low thyroid function Patient not taking: Reported on 09/26/2017 01/26/16   Lindell Spar I, NP  traZODone (DESYREL) 50 MG tablet Take 1 tablet (25 mg) three times daily & 2 tablets (50 mg) at bedtime: For depression/insomnia Patient not taking: Reported on 09/26/2017 01/26/16   Encarnacion Slates, NP    Past Medical, Surgical Family and Social History reviewed and updated.    Objective:  There were no vitals filed for this visit.  Wt Readings from Last 3 Encounters:  09/26/17 238 lb (108 kg)  02/15/17 235 lb (106.6 kg)  10/05/15 240 lb (108.9 kg)    Physical Exam         Assessment & Plan:  There are no diagnoses linked to this encounter.   Carroll Sage. Kenton Kingfisher, MSN, FNP-C [facility]  [address] [place],  [place] [address] [phone]

## 2017-09-26 NOTE — Progress Notes (Signed)
Patient ID: Erin [name], female    DOB: January 26, 1966, 51 y.o.   MRN: [id number]  PCP: Scot Jun, FNP  Chief Complaint  Patient presents with  . Establish Care  . Hospitalization Follow-up  . Medication Refill    Subjective:  HPI Erin [name] is a 51 y.o. female presents to establish care. Medical history significant for polysubstance abuse, alcoholic liver disease, portal hypertension, hypothyroidism, bipolar disorder and tobacco use. Erin [name] was recently seen at [facility] for left hip and sciatic nerve pain. She was treated conservatively with solumedrol infection and diclofenac for inflammation along with hydrocodone-acetaminophen for pain. Today she reports continued left hip pain. 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 Describes hip pain as constant aching ,which is occasionally sharp as it radiates down her left leg. Initially achieved relief with injection from urgent care and pain medication, however pain has now return. She is requesting a referral to orthopedic specialist. Erin [name] reports history of liver disease and has not had recent follow-up with gastroenterologist. According to prior notes she suffers from alcoholic cirrhosis of the liver and has experienced portal hypertension previously. Also of note, sarcoidosis is listed as a diagnosis however, patient is unaware of diagnosis. Denies any chronic shortness of breath, rashes, or known cardiovascular symptoms. Erin [name] has an extensive mental health history and is not presently receiving mental health services. She stopped taking her medication over 1 year ago and reports the only medication that provided her relief of depression symptoms was trazodone. Reports really negative thoughts and dreams associated with taking Seroquel. Depression symptoms did not improve with fluoxetine. At present, she denies thoughts of suicide or harming  others. Social History   Social History  . Marital status: Divorced    Spouse name: N/A  . Number of children: N/A  . Years of education: N/A   Occupational History  . Not on file.   Social History Main Topics  . Smoking status: Current Some Day Smoker    Packs/day: 0.25    Types: Cigarettes  . Smokeless tobacco: Never Used  . Alcohol use Yes     Comment: went 8 months without drinking but drank 2 today to try to feel better  . Drug use: Yes    Types: Marijuana  . Sexual activity: No   Other Topics Concern  . Not on file   Social History Narrative  . No narrative on file    Family History  Problem Relation Age of Onset  . Diabetes Mother   . Hypertension Mother   . Diabetes Daughter   . Hypertension Daughter    Review of Systems  Constitutional: Negative.   Respiratory: Negative.   Cardiovascular: Positive for leg swelling. Negative for chest pain and palpitations.  Musculoskeletal: Positive for arthralgias and gait problem.  Neurological: Negative for dizziness, light-headedness and headaches.  Psychiatric/Behavioral: Positive for decreased concentration and sleep disturbance.    Patient Active Problem List   Diagnosis Date Noted  . Benzodiazepine abuse ([place]) 02/16/2017  . Substance induced mood disorder ([place]) 02/16/2017  . Alcohol use disorder, severe, dependence ([place]) 01/12/2016  . Cocaine dependence with cocaine-induced mood disorder ([place])   . Opioid dependence with withdrawal ([place])   . Bipolar disorder ([place]) 02/09/2015  . Alcohol dependence with alcohol-induced mood disorder ([place])   . Suicidal ideations   . Opioid dependence ([place]) 05/06/2014  . Cocaine dependence ([place]) 07/17/2013  . Thrombocytopenia ([place])  01/06/2013  . Sarcoidosis 03/30/2010  . HYPERTENSION, BENIGN ESSENTIAL 01/11/2010  . HYPOTHYROIDISM 03/02/2009  . ALCOHOLIC LIVER DISEASE 38/46/6599  . CIRRHOSIS, ALCOHOLIC, LIVER 35/70/1779  . TOBACCO DEPENDENCE 02/22/2007  . EXTERNAL HEMORRHOIDS  01/11/2005  . PORTAL HYPERTENSION 01/11/2005    Allergies  Allergen Reactions  . Fluoxetine Hcl Other (See Comments)     hyperactivity  . Metoclopramide Hcl Other (See Comments)    depression  . Morphine And Related Other (See Comments)    High doses cause hallucinations    Prior to Admission medications   Medication Sig Start Date End Date Taking? Authorizing Provider  diclofenac (VOLTAREN) 75 MG EC tablet Take 1 tablet (75 mg total) by mouth 2 (two) times daily. 08/29/17  Yes Vanessa Kick, MD  HYDROcodone-acetaminophen (NORCO/VICODIN) 5-325 MG tablet Take 1 tablet by mouth every 6 (six) hours as needed for moderate pain or severe pain. 08/29/17  Yes Vanessa Kick, MD  albuterol (PROVENTIL HFA;VENTOLIN HFA) 108 (90 Base) MCG/ACT inhaler Inhale 1-2 puffs into the lungs every 6 (six) hours as needed for wheezing. 01/26/16 02/16/17  Lindell Spar I, NP  albuterol (PROVENTIL HFA;VENTOLIN HFA) 108 (90 Base) MCG/ACT inhaler Inhale 2 puffs into the lungs every 4 (four) hours as needed for wheezing or shortness of breath. Patient not taking: Reported on 09/26/2017 01/28/17   Long, Wonda Olds, MD  benzonatate (TESSALON) 100 MG capsule Take 1 capsule (100 mg total) by mouth every 8 (eight) hours. Patient not taking: Reported on 02/16/2017 01/28/17   Margette Fast, MD  carisoprodol (SOMA) 350 MG tablet Take 1 tablet three times daily: For muscle cramps Patient not taking: Reported on 09/26/2017 01/26/16   Lindell Spar I, NP  docusate sodium (COLACE) 100 MG capsule Take 1 capsule (100 mg total) by mouth 2 (two) times daily. (This is an over the counter medicine): For constipation Patient not taking: Reported on 09/26/2017 01/26/16   Lindell Spar I, NP  DULoxetine (CYMBALTA) 60 MG capsule Take 1 tablet (60 mg) daily: For depression/muscle pain Patient not taking: Reported on 09/26/2017 01/26/16   Lindell Spar I, NP  furosemide (LASIX) 20 MG tablet Take 1 tablet (20 mg total) by mouth daily. For swellings Patient not  taking: Reported on 09/26/2017 01/26/16   Lindell Spar I, NP  gabapentin (NEURONTIN) 300 MG capsule Take 1 tablet (300 mg) four times daily: For agitation/pain Patient not taking: Reported on 09/26/2017 01/26/16   Lindell Spar I, NP  hydrochlorothiazide (HYDRODIURIL) 25 MG tablet Take 1 tablet (25 mg total) by mouth daily. For high blood pressure Patient not taking: Reported on 09/26/2017 01/26/16   Lindell Spar I, NP  loratadine (CLARITIN) 10 MG tablet Take 1 tablet (10 mg total) by mouth daily. (may purchase from over the counter at yr local pharmacy): For allergies Patient not taking: Reported on 02/16/2017 01/26/16   Lindell Spar I, NP  propranolol (INDERAL) 20 MG tablet Take 1 tablet (20 mg total) by mouth 2 (two) times daily. For anxiety Patient not taking: Reported on 02/16/2017 01/26/16   Lindell Spar I, NP  QUEtiapine (SEROQUEL) 200 MG tablet Take 1 tablet (200 mg total) by mouth at bedtime. Patient not taking: Reported on 09/26/2017 01/26/16   Lindell Spar I, NP  QUEtiapine (SEROQUEL) 50 MG tablet Take 1 tablet (50 mg total) by mouth 3 (three) times daily. For agitation Patient not taking: Reported on 09/26/2017 01/26/16   Lindell Spar I, NP  thyroid (ARMOUR) 30 MG tablet Take 1 tablet (30 mg total) by  mouth daily before breakfast. For low thyroid function Patient not taking: Reported on 09/26/2017 01/26/16   Lindell Spar I, NP  traZODone (DESYREL) 50 MG tablet Take 1 tablet (25 mg) three times daily & 2 tablets (50 mg) at bedtime: For depression/insomnia Patient not taking: Reported on 09/26/2017 01/26/16   Encarnacion Slates, NP    Past Medical, Surgical Family and Social History reviewed and updated.    Objective:   Today's Vitals   09/26/17 1400  BP: 125/88  Pulse: 89  Resp: 16  Temp: 98.6 F (37 C)  TempSrc: Oral  SpO2: 97%  Weight: 238 lb (108 kg)  Height: 5\' 7"  (1.702 m)    Wt Readings from Last 3 Encounters:  09/26/17 238 lb (108 kg)  02/15/17 235 lb (106.6 kg)  10/05/15 240 lb  (108.9 kg)   Physical Exam  Constitutional: She is oriented to person, place, and time. She appears well-developed and well-nourished.  HENT:  Head: Normocephalic and atraumatic.  Eyes: Pupils are equal, round, and reactive to light. Conjunctivae and EOM are normal.  Neck: No thyromegaly present.  Cardiovascular: Normal rate, regular rhythm, normal heart sounds and intact distal pulses.   Pulmonary/Chest: Effort normal and breath sounds normal.  Musculoskeletal: Normal range of motion.  Lymphadenopathy:    She has no cervical adenopathy.  Neurological: She is alert and oriented to person, place, and time.  Skin: Skin is warm and dry.  Psychiatric: She has a normal mood and affect. Her behavior is normal. Judgment and thought content normal.   Assessment & Plan:  1. Left hip pain-obtaining imaging. Pain is likely secondary to obesity and deconditioning. Will obtain imaging to rule out degenerative joint disease. 2. Alcoholic cirrhosis, unspecified whether ascites present (HCC)-Referring to gastroenterology for evaluation and management recommendations.  4. Recurrent major depressive disorder, remission status unspecified (HCC)-resuming trazodone. Recommend talk therapy in addition to medication management.  Referring to behavioral health. 5. Insomnia, unspecified type-resumed trazodone. 6. Need for influenza vaccination- Flu Vaccine QUAD 36+ mos IM 7. Need for Td vaccine 8. Need for pneumococcal vaccination 9. Hypertension, controlled today , although elevated at recent urgent care visit. Agreed to resume Hydrochlorothiazide 12.5 mg once daily   Immunization History  Administered Date(s) Administered  . Influenza Whole 12/17/2009  . Influenza,inj,Quad PF,6+ Mos 02/08/2015, 01/21/2016, 09/26/2017  . Pneumococcal Polysaccharide-23 01/05/2013, 02/08/2015, 09/26/2017  . Td 02/27/2009, 09/26/2017    RTC: 2 weeks for fasting labs and complete physical exam.   Carroll Sage. Kenton Kingfisher, MSN,  FNP-C [facility]  [address] [place], [place] [address] [phone]

## 2017-10-03 ENCOUNTER — Other Ambulatory Visit: Payer: Self-pay

## 2017-10-03 ENCOUNTER — Telehealth: Payer: Self-pay | Admitting: Family Medicine

## 2017-10-03 MED ORDER — HYDROCHLOROTHIAZIDE 25 MG PO TABS: 25.0000 mg | ORAL_TABLET | Freq: Every day | ORAL | Status: DC

## 2017-10-03 MED ORDER — TRAZODONE HCL 50 MG PO TABS: ORAL_TABLET | ORAL | 0 refills | Status: DC

## 2017-10-03 NOTE — Telephone Encounter (Signed)
Please contact patient to advise I have ordered an x-ray image of her left hip. She will need to come to [facility] to obtain x-ray, preferably prior to her visit with orthopedics.   Carroll Sage. Kenton Kingfisher, MSN, FNP-C [facility]  [address] [place], [place] [address] [phone]

## 2017-10-04 NOTE — Telephone Encounter (Signed)
Patient notified and stated that she will do imaging. Patient states that she was suppose to have all her medication sent to [facility] but it was only the trazodone there.

## 2017-10-04 NOTE — Telephone Encounter (Signed)
Returned call to patient and confirmed receipt of both trazodone and HCTZ. All other medications where on hold until she had fasting labs performed as she has been off medication for more than 1 year. She has a follow-up scheduled for 10/17/17.  Carroll Sage. Kenton Kingfisher, MSN, FNP-C [facility]  [address] [place], [place] [address] [phone]

## 2017-10-17 ENCOUNTER — Ambulatory Visit: Payer: Self-pay | Admitting: Family Medicine

## 2017-11-16 ENCOUNTER — Other Ambulatory Visit: Payer: Self-pay

## 2017-11-16 ENCOUNTER — Encounter (HOSPITAL_COMMUNITY): Payer: Self-pay | Admitting: Emergency Medicine

## 2017-11-16 ENCOUNTER — Emergency Department (HOSPITAL_COMMUNITY)
Admission: EM | Admit: 2017-11-16 | Discharge: 2017-11-16 | Disposition: A | Discharge status: Home / Self Care | Payer: Medicare Other | Attending: Emergency Medicine | Admitting: Emergency Medicine

## 2017-11-16 ENCOUNTER — Emergency Department (HOSPITAL_COMMUNITY): Payer: Medicare Other

## 2017-11-16 DIAGNOSIS — Z79899 Other long term (current) drug therapy: Secondary | ICD-10-CM | POA: Diagnosis not present

## 2017-11-16 DIAGNOSIS — F17210 Nicotine dependence, cigarettes, uncomplicated: Secondary | ICD-10-CM | POA: Insufficient documentation

## 2017-11-16 DIAGNOSIS — R05 Cough: Secondary | ICD-10-CM

## 2017-11-16 DIAGNOSIS — I10 Essential (primary) hypertension: Secondary | ICD-10-CM | POA: Insufficient documentation

## 2017-11-16 DIAGNOSIS — J40 Bronchitis, not specified as acute or chronic: Secondary | ICD-10-CM | POA: Insufficient documentation

## 2017-11-16 DIAGNOSIS — J9801 Acute bronchospasm: Secondary | ICD-10-CM | POA: Diagnosis not present

## 2017-11-16 DIAGNOSIS — J209 Acute bronchitis, unspecified: Secondary | ICD-10-CM

## 2017-11-16 DIAGNOSIS — R059 Cough, unspecified: Secondary | ICD-10-CM

## 2017-11-16 DIAGNOSIS — E039 Hypothyroidism, unspecified: Secondary | ICD-10-CM | POA: Insufficient documentation

## 2017-11-16 MED ORDER — ALBUTEROL SULFATE (2.5 MG/3ML) 0.083% IN NEBU
2.5000 mg | INHALATION_SOLUTION | RESPIRATORY_TRACT | Status: DC | PRN
Start: 2017-11-16 — End: 2017-11-16

## 2017-11-16 MED ORDER — DOXYCYCLINE HYCLATE 100 MG PO CAPS: 100.0000 mg | ORAL_CAPSULE | Freq: Two times a day (BID) | ORAL | 0 refills | Status: DC

## 2017-11-16 MED ORDER — ACETAMINOPHEN 325 MG PO TABS
650.0000 mg | ORAL_TABLET | Freq: Once | ORAL | Status: DC
  Filled 2017-11-16: qty 2

## 2017-11-16 MED ORDER — PREDNISONE 20 MG PO TABS
60.0000 mg | ORAL_TABLET | Freq: Once | ORAL | Status: AC
  Administered 2017-11-16: 60 mg via ORAL
  Filled 2017-11-16: qty 3

## 2017-11-16 MED ORDER — AZITHROMYCIN 250 MG PO TABS
500.0000 mg | ORAL_TABLET | Freq: Once | ORAL | Status: AC
  Administered 2017-11-16: 500 mg via ORAL
  Filled 2017-11-16: qty 2

## 2017-11-16 MED ORDER — ALBUTEROL SULFATE HFA 108 (90 BASE) MCG/ACT IN AERS: 2.0000 | INHALATION_SPRAY | RESPIRATORY_TRACT | 1 refills | Status: DC | PRN

## 2017-11-16 MED ORDER — IPRATROPIUM BROMIDE 0.02 % IN SOLN
0.5000 mg | Freq: Once | RESPIRATORY_TRACT | Status: AC
  Administered 2017-11-16: 0.5 mg via RESPIRATORY_TRACT
  Filled 2017-11-16: qty 2.5

## 2017-11-16 MED ORDER — ALBUTEROL SULFATE (2.5 MG/3ML) 0.083% IN NEBU
5.0000 mg | INHALATION_SOLUTION | Freq: Once | RESPIRATORY_TRACT | Status: AC
  Administered 2017-11-16: 5 mg via RESPIRATORY_TRACT
  Filled 2017-11-16: qty 6

## 2017-11-16 MED ORDER — PREDNISONE 20 MG PO TABS: 60.0000 mg | ORAL_TABLET | Freq: Every day | ORAL | 0 refills | Status: DC

## 2017-11-16 NOTE — ED Triage Notes (Signed)
Cough, bad chest  congestion and sob x 1 week lots of phlegm some nausea

## 2017-11-16 NOTE — ED Provider Notes (Signed)
[facility] EMERGENCY DEPARTMENT Provider Note   CSN: [id number] Arrival date & time: 11/16/17  1324     History   Chief Complaint Chief Complaint  Patient presents with  . Cough    HPI Erin [name] is a 51 y.o. female.  Patient c/o productive cough, congestion for the past week. Cough non prod in ED, but states yellow/green phlegm at home.  Feels wheezy at times. Subjective fever. Hx sarcoid. +smoker. Denies chest pain or discomfort other than w coughing spell. Denies leg pain or swelling. No orthopnea. Denies recent abx use.    The history is provided by the patient.  Cough  Associated symptoms include shortness of breath and wheezing. Pertinent negatives include no chest pain, no headaches, no rhinorrhea, no sore throat and no eye redness.    Past Medical History:  Diagnosis Date  . Anxiety   . Bipolar 1 disorder ([place])   . Blood transfusion July 2012  . Bronchitis   . Cirrhosis of liver ([place])   . Depression   . History of sarcoidosis   . Hypothyroidism   . Liver failure ([place])   . Menorrhagia   . Wegener's granulomatosis [facility])     Patient Active Problem List   Diagnosis Date Noted  . Benzodiazepine abuse ([place]) 02/16/2017  . Substance induced mood disorder ([place]) 02/16/2017  . Alcohol use disorder, severe, dependence ([place]) 01/12/2016  . Cocaine dependence with cocaine-induced mood disorder ([place])   . Opioid dependence with withdrawal ([place])   . Bipolar disorder ([place]) 02/09/2015  . Alcohol dependence with alcohol-induced mood disorder ([place])   . Suicidal ideations   . Opioid dependence ([place]) 05/06/2014  . Cocaine dependence ([place]) 07/17/2013  . Thrombocytopenia ([place]) 01/06/2013  . Sarcoidosis 03/30/2010  . HYPERTENSION, BENIGN ESSENTIAL 01/11/2010  . HYPOTHYROIDISM 03/02/2009  . ALCOHOLIC LIVER DISEASE 16/06/3709  . CIRRHOSIS, ALCOHOLIC, LIVER 62/69/4854  . TOBACCO DEPENDENCE 02/22/2007  . EXTERNAL HEMORRHOIDS 01/11/2005  . PORTAL  HYPERTENSION 01/11/2005    Past Surgical History:  Procedure Laterality Date  . CHOLECYSTECTOMY    . FRACTURE SURGERY    . HERNIA REPAIR    . LAPAROSCOPY    . LAPAROTOMY  07/28/2011   Procedure: EXPLORATORY LAPAROTOMY;  Surgeon: [name], MD;  Location: [facility];  Service: Gynecology;  Laterality: N/A;  . ORTHOPEDIC SURGERY    . TUBAL LIGATION    . VAGINAL HYSTERECTOMY  07/27/2011   Procedure: HYSTERECTOMY VAGINAL;  Surgeon: [name], MD;  Location: [facility];  Service: Gynecology;  Laterality: N/A;  Vaginal Hysterectomy with Right Oophorectomy    OB History    Gravida Para Term Preterm AB Living   4 3 3  0 1 3   SAB TAB Ectopic Multiple Live Births                   Home Medications    Prior to Admission medications   Medication Sig Start Date End Date Taking? Authorizing Provider  albuterol (PROVENTIL HFA;VENTOLIN HFA) 108 (90 Base) MCG/ACT inhaler Inhale 1-2 puffs into the lungs every 6 (six) hours as needed for wheezing. 01/26/16 02/16/17  Lindell Spar I, NP  albuterol (PROVENTIL HFA;VENTOLIN HFA) 108 (90 Base) MCG/ACT inhaler Inhale 2 puffs into the lungs every 4 (four) hours as needed for wheezing or shortness of breath. Patient not taking: Reported on 09/26/2017 01/28/17   Long, Wonda Olds, MD  benzonatate (TESSALON) 100 MG capsule Take 1 capsule (100 mg total) by mouth every 8 (eight) hours. Patient  not taking: Reported on 02/16/2017 01/28/17   Margette Fast, MD  carisoprodol (SOMA) 350 MG tablet Take 1 tablet three times daily: For muscle cramps Patient not taking: Reported on 09/26/2017 01/26/16   Lindell Spar I, NP  diclofenac (VOLTAREN) 75 MG EC tablet Take 1 tablet (75 mg total) by mouth 2 (two) times daily. Patient not taking: Reported on 11/16/2017 08/29/17   Vanessa Kick, MD  docusate sodium (COLACE) 100 MG capsule Take 1 capsule (100 mg total) by mouth 2 (two) times daily. (This is an over the counter medicine): For constipation Patient not taking: Reported on 09/26/2017  01/26/16   Lindell Spar I, NP  DULoxetine (CYMBALTA) 60 MG capsule Take 1 tablet (60 mg) daily: For depression/muscle pain Patient not taking: Reported on 09/26/2017 01/26/16   Lindell Spar I, NP  furosemide (LASIX) 20 MG tablet Take 1 tablet (20 mg total) by mouth daily. For swellings Patient not taking: Reported on 09/26/2017 01/26/16   Lindell Spar I, NP  gabapentin (NEURONTIN) 300 MG capsule Take 1 tablet (300 mg) four times daily: For agitation/pain Patient not taking: Reported on 09/26/2017 01/26/16   Lindell Spar I, NP  hydrochlorothiazide (HYDRODIURIL) 25 MG tablet Take 1 tablet (25 mg total) by mouth daily. For high blood pressure Patient not taking: Reported on 11/16/2017 10/03/17   Scot Jun, FNP  HYDROcodone-acetaminophen (NORCO/VICODIN) 5-325 MG tablet Take 1 tablet by mouth every 6 (six) hours as needed for moderate pain or severe pain. Patient not taking: Reported on 11/16/2017 08/29/17   Vanessa Kick, MD  loratadine (CLARITIN) 10 MG tablet Take 1 tablet (10 mg total) by mouth daily. (may purchase from over the counter at yr local pharmacy): For allergies Patient not taking: Reported on 02/16/2017 01/26/16   Lindell Spar I, NP  propranolol (INDERAL) 20 MG tablet Take 1 tablet (20 mg total) by mouth 2 (two) times daily. For anxiety Patient not taking: Reported on 02/16/2017 01/26/16   Lindell Spar I, NP  QUEtiapine (SEROQUEL) 200 MG tablet Take 1 tablet (200 mg total) by mouth at bedtime. Patient not taking: Reported on 09/26/2017 01/26/16   Lindell Spar I, NP  QUEtiapine (SEROQUEL) 50 MG tablet Take 1 tablet (50 mg total) by mouth 3 (three) times daily. For agitation Patient not taking: Reported on 09/26/2017 01/26/16   Lindell Spar I, NP  thyroid (ARMOUR) 30 MG tablet Take 1 tablet (30 mg total) by mouth daily before breakfast. For low thyroid function Patient not taking: Reported on 09/26/2017 01/26/16   Lindell Spar I, NP  traZODone (DESYREL) 50 MG tablet Take 1 tablet (25 mg) three  times daily & 2 tablets (50 mg) at bedtime: For depression/insomnia Patient not taking: Reported on 11/16/2017 10/03/17   Scot Jun, FNP    Family History Family History  Problem Relation Age of Onset  . Diabetes Mother   . Hypertension Mother   . Diabetes Daughter   . Hypertension Daughter     Social History Social History   Tobacco Use  . Smoking status: Current Every Day Smoker    Packs/day: 0.25    Types: Cigarettes  . Smokeless tobacco: Never Used  Substance Use Topics  . Alcohol use: Yes    Comment: went 8 months without drinking but drank 2 today to try to feel better  . Drug use: Yes    Types: Marijuana     Allergies   Fluoxetine hcl; Metoclopramide hcl; and Morphine and related   Review of Systems Review of  Systems  Constitutional: Positive for fever.  HENT: Negative for rhinorrhea and sore throat.   Eyes: Negative for discharge and redness.  Respiratory: Positive for cough, shortness of breath and wheezing.   Cardiovascular: Negative for chest pain and leg swelling.  Gastrointestinal: Negative for abdominal pain and vomiting.  Genitourinary: Negative for flank pain.  Musculoskeletal: Negative for back pain and neck pain.  Skin: Negative for rash.  Neurological: Negative for headaches.  Hematological: Does not bruise/bleed easily.  Psychiatric/Behavioral: Negative for confusion.     Physical Exam Updated Vital Signs BP (!) 144/91   Pulse (!) 107   Resp 20   SpO2 96%   Physical Exam  Constitutional: She appears well-developed and well-nourished. No distress.  HENT:  Mouth/Throat: Oropharynx is clear and moist.  Eyes: Conjunctivae are normal. No scleral icterus.  Neck: Neck supple. No tracheal deviation present.  Cardiovascular: Normal rate, regular rhythm, normal heart sounds and intact distal pulses. Exam reveals no gallop and no friction rub.  No murmur heard. Pulmonary/Chest: Effort normal. No respiratory distress. She has wheezes.    Abdominal: Soft. Normal appearance and bowel sounds are normal. She exhibits no distension. There is no tenderness.  Musculoskeletal: She exhibits no edema or tenderness.  Neurological: She is alert.  Skin: Skin is warm and dry. No rash noted. She is not diaphoretic.  Psychiatric: She has a normal mood and affect.  Nursing note and vitals reviewed.    ED Treatments / Results  Labs (all labs ordered are listed, but only abnormal results are displayed) Labs Reviewed - No data to display  EKG  EKG Interpretation None       Radiology Dg Chest 2 View  Result Date: 11/16/2017 CLINICAL DATA:  Cough and congestion. EXAM: CHEST  2 VIEW COMPARISON:  January 28, 2017 FINDINGS: There is no edema or consolidation. The heart size and pulmonary vascularity are normal. No adenopathy. No bone lesions. IMPRESSION: No edema or consolidation. Electronically Signed   By: Lowella Grip III M.D.   On: 11/16/2017 13:46    Procedures Procedures (including critical care time)  Medications Ordered in ED Medications  albuterol (PROVENTIL) (2.5 MG/3ML) 0.083% nebulizer solution 2.5 mg (not administered)  albuterol (PROVENTIL) (2.5 MG/3ML) 0.083% nebulizer solution 5 mg (not administered)  ipratropium (ATROVENT) nebulizer solution 0.5 mg (not administered)  acetaminophen (TYLENOL) tablet 650 mg (not administered)  albuterol (PROVENTIL) (2.5 MG/3ML) 0.083% nebulizer solution 5 mg (5 mg Nebulization Given 11/16/17 1408)     Initial Impression / Assessment and Plan / ED Course  I have reviewed the triage vital signs and the nursing notes.  Pertinent labs & imaging results that were available during my care of the patient were reviewed by me and considered in my medical decision making (see chart for details).  Albuterol and atrovent neb.   Recheck, wheezing improved.  Breathing comfortably.  Given prod cough, fevers, congestion on lung exam - will rx abx.    Reviewed nursing notes and prior  charts for additional history.     Final Clinical Impressions(s) / ED Diagnoses   Final diagnoses:  None    ED Discharge Orders    None       Lajean Saver, MD 11/16/17 1438

## 2017-11-16 NOTE — Discharge Instructions (Addendum)
It was our pleasure to provide your ER care today - we hope that you feel better.  Rest. Drink adequate fluids.    Take antibiotic as prescribed. Take prednisone as prescribed.   Use albuterol inhaler as need if wheezing.   Avoid any smoking.   Follow up with primary care doctor in coming week if symptoms fail to improve/resolve.  Return to ER if worse, increased trouble breathing, other concern.

## 2017-11-22 ENCOUNTER — Ambulatory Visit: Payer: Self-pay | Admitting: Family Medicine

## 2017-11-23 ENCOUNTER — Ambulatory Visit (INDEPENDENT_AMBULATORY_CARE_PROVIDER_SITE_OTHER): Payer: Self-pay | Admitting: Orthopaedic Surgery

## 2017-11-27 ENCOUNTER — Ambulatory Visit (INDEPENDENT_AMBULATORY_CARE_PROVIDER_SITE_OTHER): Payer: Self-pay | Admitting: Orthopaedic Surgery

## 2017-12-07 ENCOUNTER — Ambulatory Visit (INDEPENDENT_AMBULATORY_CARE_PROVIDER_SITE_OTHER): Payer: Self-pay | Admitting: Orthopaedic Surgery

## 2018-01-11 ENCOUNTER — Encounter: Payer: Self-pay | Admitting: Family Medicine

## 2018-01-12 ENCOUNTER — Ambulatory Visit (INDEPENDENT_AMBULATORY_CARE_PROVIDER_SITE_OTHER): Payer: Medicare Other | Admitting: Family Medicine

## 2018-01-12 ENCOUNTER — Encounter: Payer: Self-pay | Admitting: Family Medicine

## 2018-01-12 VITALS — BP 140/82 | HR 88 | Temp 97.6°F | Resp 97 | Ht 67.0 in | Wt 244.0 lb

## 2018-01-12 DIAGNOSIS — K7030 Alcoholic cirrhosis of liver without ascites: Secondary | ICD-10-CM | POA: Diagnosis not present

## 2018-01-12 DIAGNOSIS — Z1231 Encounter for screening mammogram for malignant neoplasm of breast: Secondary | ICD-10-CM | POA: Diagnosis not present

## 2018-01-12 DIAGNOSIS — Z131 Encounter for screening for diabetes mellitus: Secondary | ICD-10-CM

## 2018-01-12 DIAGNOSIS — Z13220 Encounter for screening for lipoid disorders: Secondary | ICD-10-CM

## 2018-01-12 DIAGNOSIS — E039 Hypothyroidism, unspecified: Secondary | ICD-10-CM

## 2018-01-12 DIAGNOSIS — I10 Essential (primary) hypertension: Secondary | ICD-10-CM | POA: Diagnosis not present

## 2018-01-12 DIAGNOSIS — Z0000 Encounter for general adult medical examination without abnormal findings: Secondary | ICD-10-CM

## 2018-01-12 DIAGNOSIS — B349 Viral infection, unspecified: Secondary | ICD-10-CM

## 2018-01-12 DIAGNOSIS — Z1239 Encounter for other screening for malignant neoplasm of breast: Secondary | ICD-10-CM

## 2018-01-12 LAB — POCT URINALYSIS DIP (DEVICE)
Glucose, UA: NEGATIVE mg/dL
KETONES UR: NEGATIVE mg/dL
LEUKOCYTES UA: NEGATIVE
Nitrite: NEGATIVE
Protein, ur: NEGATIVE mg/dL
SPECIFIC GRAVITY, URINE: 1.025 (ref 1.005–1.030)
Urobilinogen, UA: 2 mg/dL — ABNORMAL HIGH (ref 0.0–1.0)
pH: 6 (ref 5.0–8.0)

## 2018-01-12 LAB — POCT GLYCOSYLATED HEMOGLOBIN (HGB A1C): Hemoglobin A1C: 4.8

## 2018-01-12 MED ORDER — ONDANSETRON HCL 4 MG PO TABS: 4.0000 mg | ORAL_TABLET | Freq: Three times a day (TID) | ORAL | 1 refills | Status: DC | PRN

## 2018-01-12 MED ORDER — HYDROCHLOROTHIAZIDE 25 MG PO TABS: 25.0000 mg | ORAL_TABLET | Freq: Every day | ORAL | Status: DC

## 2018-01-12 MED ORDER — THYROID 30 MG PO TABS: 30.0000 mg | ORAL_TABLET | Freq: Every day | ORAL | 1 refills | Status: DC

## 2018-01-12 MED ORDER — BUDESONIDE-FORMOTEROL FUMARATE 80-4.5 MCG/ACT IN AERO: 2.0000 | INHALATION_SPRAY | Freq: Two times a day (BID) | RESPIRATORY_TRACT | 3 refills | Status: DC

## 2018-01-12 MED ORDER — TRAZODONE HCL 50 MG PO TABS: ORAL_TABLET | ORAL | 0 refills | Status: DC

## 2018-01-12 MED ORDER — MONTELUKAST SODIUM 10 MG PO TABS: 10.0000 mg | ORAL_TABLET | Freq: Every day | ORAL | 3 refills | Status: DC

## 2018-01-12 MED ORDER — HYDROCHLOROTHIAZIDE 25 MG PO TABS: 25.0000 mg | ORAL_TABLET | Freq: Every day | ORAL | 1 refills | Status: DC

## 2018-01-12 MED ORDER — THYROID 30 MG PO TABS: 30.0000 mg | ORAL_TABLET | Freq: Every day | ORAL | 0 refills | Status: DC

## 2018-01-12 MED ORDER — TRAZODONE HCL 50 MG PO TABS: ORAL_TABLET | ORAL | 1 refills | Status: DC

## 2018-01-12 NOTE — Progress Notes (Signed)
Patient ID: Erin [name], female    DOB: May 23, 1966, 52 y.o.   MRN: [id number]  PCP: Scot Jun, FNP  Chief Complaint  Patient presents with  . Shaking  . Chills  . Fever  . Emesis  . Nasal Congestion    Subjective:  HPI Erin [name] is a 52 y.o. female with medical history significant for polysubstance abuse, alcoholic liver disease, sarcoidosis,  portal hypertension, hypothyroidism, bipolar disorder and tobacco use, presents today for CPE, evaluation of current illness and follow-up of chronic conditions.. During [name]',s last office visit she was referred to gastroenterology, behavorial health, neurosurgeon, and orthopedics. She has not followed up with any of the referrals and declined to schedule an appointment for orthopedics. She admits to noncompliance with medications and repots only taking levothyroxine and trazodone. She complains today of a cluster of symptoms including chills, tremors, subjective fever, N&V, and nasal congestion. She has not attempted relief of symptoms with any medication. Symptoms have remained persistent x 1 day. Denies use of any alcohol or any other substances. Reports no associated abdominal pain, dysuria, headache, SOB, or chest pain. [name] reports poor mood. She was unable to tolerate Seroquel, Gabapentin, Cymbalta,  "made me feel funny". Currently denies suicidal ideations, auditory hallucinations, or homicidal ideations. Social History   Socioeconomic History  . Marital status: Divorced    Spouse name: Not on file  . Number of children: Not on file  . Years of education: Not on file  . Highest education level: Not on file  Social Needs  . Financial resource strain: Not on file  . Food insecurity - worry: Not on file  . Food insecurity - inability: Not on file  . Transportation needs - medical: Not on file  . Transportation needs - non-medical: Not on file  Occupational History  . Not on file  Tobacco Use  . Smoking  status: Current Every Day Smoker    Packs/day: 0.25    Types: Cigarettes  . Smokeless tobacco: Never Used  Substance and Sexual Activity  . Alcohol use: Yes    Comment: went 8 months without drinking but drank 2 today to try to feel better  . Drug use: Yes    Types: Marijuana  . Sexual activity: No  Other Topics Concern  . Not on file  Social History Narrative  . Not on file    Family History  Problem Relation Age of Onset  . Diabetes Mother   . Hypertension Mother   . Diabetes Daughter   . Hypertension Daughter    Review of Systems  Constitutional: Negative.   Respiratory: Negative.   Cardiovascular: Positive for leg swelling. Negative for chest pain and palpitations.  Musculoskeletal: Positive for arthralgias and gait problem.  Neurological: Negative for dizziness, light-headedness and headaches.  Psychiatric/Behavioral: Positive for decreased concentration and sleep disturbance.    Patient Active Problem List   Diagnosis Date Noted  . Benzodiazepine abuse ([place]) 02/16/2017  . Substance induced mood disorder ([place]) 02/16/2017  . Alcohol use disorder, severe, dependence ([place]) 01/12/2016  . Cocaine dependence with cocaine-induced mood disorder ([place])   . Opioid dependence with withdrawal ([place])   . Bipolar disorder ([place]) 02/09/2015  . Alcohol dependence with alcohol-induced mood disorder ([place])   . Suicidal ideations   . Opioid dependence ([place]) 05/06/2014  . Cocaine dependence ([place]) 07/17/2013  . Thrombocytopenia ([place]) 01/06/2013  . Sarcoidosis 03/30/2010  . HYPERTENSION, BENIGN ESSENTIAL 01/11/2010  . HYPOTHYROIDISM 03/02/2009  . ALCOHOLIC LIVER DISEASE 55/73/2202  .  CIRRHOSIS, ALCOHOLIC, LIVER 40/98/1191  . TOBACCO DEPENDENCE 02/22/2007  . EXTERNAL HEMORRHOIDS 01/11/2005  . PORTAL HYPERTENSION 01/11/2005    Allergies  Allergen Reactions  . Fluoxetine Hcl Other (See Comments)     hyperactivity  . Metoclopramide Hcl Other (See Comments)    depression  . Morphine  And Related Other (See Comments)    High doses cause hallucinations    Prior to Admission medications   Medication Sig Start Date End Date Taking? Authorizing Provider  diclofenac (VOLTAREN) 75 MG EC tablet Take 1 tablet (75 mg total) by mouth 2 (two) times daily. 08/29/17  Yes Vanessa Kick, MD  HYDROcodone-acetaminophen (NORCO/VICODIN) 5-325 MG tablet Take 1 tablet by mouth every 6 (six) hours as needed for moderate pain or severe pain. 08/29/17  Yes Vanessa Kick, MD  albuterol (PROVENTIL HFA;VENTOLIN HFA) 108 (90 Base) MCG/ACT inhaler Inhale 1-2 puffs into the lungs every 6 (six) hours as needed for wheezing. 01/26/16 02/16/17  Lindell Spar I, NP  albuterol (PROVENTIL HFA;VENTOLIN HFA) 108 (90 Base) MCG/ACT inhaler Inhale 2 puffs into the lungs every 4 (four) hours as needed for wheezing or shortness of breath. Patient not taking: Reported on 09/26/2017 01/28/17   Long, Wonda Olds, MD  benzonatate (TESSALON) 100 MG capsule Take 1 capsule (100 mg total) by mouth every 8 (eight) hours. Patient not taking: Reported on 02/16/2017 01/28/17   Margette Fast, MD  carisoprodol (SOMA) 350 MG tablet Take 1 tablet three times daily: For muscle cramps Patient not taking: Reported on 09/26/2017 01/26/16   Lindell Spar I, NP  docusate sodium (COLACE) 100 MG capsule Take 1 capsule (100 mg total) by mouth 2 (two) times daily. (This is an over the counter medicine): For constipation Patient not taking: Reported on 09/26/2017 01/26/16   Lindell Spar I, NP  DULoxetine (CYMBALTA) 60 MG capsule Take 1 tablet (60 mg) daily: For depression/muscle pain Patient not taking: Reported on 09/26/2017 01/26/16   Lindell Spar I, NP  furosemide (LASIX) 20 MG tablet Take 1 tablet (20 mg total) by mouth daily. For swellings Patient not taking: Reported on 09/26/2017 01/26/16   Lindell Spar I, NP  gabapentin (NEURONTIN) 300 MG capsule Take 1 tablet (300 mg) four times daily: For agitation/pain Patient not taking: Reported on 09/26/2017 01/26/16    Lindell Spar I, NP  hydrochlorothiazide (HYDRODIURIL) 25 MG tablet Take 1 tablet (25 mg total) by mouth daily. For high blood pressure Patient not taking: Reported on 09/26/2017 01/26/16   Lindell Spar I, NP  loratadine (CLARITIN) 10 MG tablet Take 1 tablet (10 mg total) by mouth daily. (may purchase from over the counter at yr local pharmacy): For allergies Patient not taking: Reported on 02/16/2017 01/26/16   Lindell Spar I, NP  propranolol (INDERAL) 20 MG tablet Take 1 tablet (20 mg total) by mouth 2 (two) times daily. For anxiety Patient not taking: Reported on 02/16/2017 01/26/16   Lindell Spar I, NP  QUEtiapine (SEROQUEL) 200 MG tablet Take 1 tablet (200 mg total) by mouth at bedtime. Patient not taking: Reported on 09/26/2017 01/26/16   Lindell Spar I, NP  QUEtiapine (SEROQUEL) 50 MG tablet Take 1 tablet (50 mg total) by mouth 3 (three) times daily. For agitation Patient not taking: Reported on 09/26/2017 01/26/16   Lindell Spar I, NP  thyroid (ARMOUR) 30 MG tablet Take 1 tablet (30 mg total) by mouth daily before breakfast. For low thyroid function Patient not taking: Reported on 09/26/2017 01/26/16   Encarnacion Slates, NP  traZODone (  DESYREL) 50 MG tablet Take 1 tablet (25 mg) three times daily & 2 tablets (50 mg) at bedtime: For depression/insomnia Patient not taking: Reported on 09/26/2017 01/26/16   Encarnacion Slates, NP    Past Medical, Surgical Family and Social History reviewed and updated.    Objective:   Today's Vitals   01/12/18 0808  BP: 140/82  Pulse: 88  Resp: (!) 97  Temp: 97.6 F (36.4 C)  TempSrc: Oral  SpO2:  98%  Weight: 244 lb (110.7 kg)  Height: 5\' 7"  (1.702 m)    Wt Readings from Last 3 Encounters:  01/12/18 244 lb (110.7 kg)  09/26/17 238 lb (108 kg)  02/15/17 235 lb (106.6 kg)   Physical Exam  Constitutional: She is oriented to person, place, and time. She appears well-developed and well-nourished.  HENT:  Head: Normocephalic and atraumatic.  Eyes: Conjunctivae  and EOM are normal. Pupils are equal, round, and reactive to light.  Neck: No thyromegaly present.  Cardiovascular: Normal rate, regular rhythm, normal heart sounds and intact distal pulses.  Pulmonary/Chest: Effort normal and breath sounds normal.  Musculoskeletal: Normal range of motion.  Lymphadenopathy:    She has no cervical adenopathy.  Neurological: She is alert and oriented to person, place, and time.  Skin: Skin is warm and dry.  Psychiatric: She has a normal mood and affect. Her behavior is normal. Judgment and thought content normal.   Assessment & Plan:  1. Alcoholic cirrhosis, unspecified whether ascites present (HCC)-Referring to gastroenterology for evaluation and management recommendations. Checking CMP and Ammonia level today. 2.Viral symptoms- present cluster of symptoms active for less than 24 hours. She is afebrile. Will treat symptomatic for now. Strict return precautions given if symptoms worsen. 3. Recurrent major depressive disorder, remission status unspecified (HCC)-resuming trazodone. Recommend talk therapy in addition to medication management.  Referring to behavioral health. 4. Hypertension, elevated today. Patient has remained non-compliant with medication therapy. Resume Hydrochlorothiazide 12.5 mg once daily. We have discussed target BP range and blood pressure goal. I have advised patient to check BP regularly and to call us back or report to clinic if the numbers are consistently higher than 140/90. We discussed the importance of compliance with medical therapy and DASH diet recommended, consequences of uncontrolled hypertension discussed. Continue current BP medications. 5. Screening for diabetes mellitus, A1C today 4.8., normal. Repeat in 12 months.  6. Lipid screening. Fasting today. Lipid panel ordered. 7. Screening for breast cancer, MM Digital Diagnostic Bilat; Future  Meds ordered this encounter  Medications  . DISCONTD: montelukast (SINGULAIR) 10 MG  tablet    Sig: Take 1 tablet (10 mg total) by mouth at bedtime.    Dispense:  30 tablet    Refill:  3    Order Specific Question:   Supervising Provider    Answer:   Tresa Garter [id number]  . DISCONTD: hydrochlorothiazide (HYDRODIURIL) 25 MG tablet    Sig: Take 1 tablet (25 mg total) by mouth daily. For high blood pressure    Order Specific Question:   Supervising Provider    Answer:   Tresa Garter [id number]  . DISCONTD: traZODone (DESYREL) 50 MG tablet    Sig: Take 1 tablet (25 mg) three times daily & 2 tablets (50 mg) at bedtime: For depression/insomnia    Dispense:  150 tablet    Refill:  0    Order Specific Question:   Supervising Provider    Answer:   Tresa Garter [id number]  . DISCONTD: thyroid (ARMOUR)  30 MG tablet    Sig: Take 1 tablet (30 mg total) by mouth daily before breakfast. For low thyroid function    Dispense:  30 tablet    Refill:  0    Order Specific Question:   Supervising Provider    Answer:   Tresa Garter [id number]  . budesonide-formoterol (SYMBICORT) 80-4.5 MCG/ACT inhaler    Sig: Inhale 2 puffs into the lungs 2 (two) times daily.    Dispense:  1 Inhaler    Refill:  3    Order Specific Question:   Supervising Provider    Answer:   Tresa Garter [id number]  . traZODone (DESYREL) 50 MG tablet    Sig: Take 1 tablet (25 mg) three times daily & 2 tablets (50 mg) at bedtime: For depression/insomnia    Dispense:  90 tablet    Refill:  1    Order Specific Question:   Supervising Provider    Answer:   Tresa Garter [id number]  . thyroid (ARMOUR) 30 MG tablet    Sig: Take 1 tablet (30 mg total) by mouth daily before breakfast. For low thyroid function    Dispense:  90 tablet    Refill:  1    Order Specific Question:   Supervising Provider    Answer:   Tresa Garter [id number]  . montelukast (SINGULAIR) 10 MG tablet    Sig: Take 1 tablet (10 mg total) by mouth at bedtime.    Dispense:  30 tablet    Refill:  3     Order Specific Question:   Supervising Provider    Answer:   Tresa Garter [id number]  . hydrochlorothiazide (HYDRODIURIL) 25 MG tablet    Sig: Take 1 tablet (25 mg total) by mouth daily. For high blood pressure    Dispense:  90 tablet    Refill:  1    Order Specific Question:   Supervising Provider    Answer:   Tresa Garter [id number]  . ondansetron (ZOFRAN) 4 MG tablet    Sig: Take 1 tablet (4 mg total) by mouth every 8 (eight) hours as needed for nausea or vomiting.    Dispense:  30 tablet    Refill:  1    Order Specific Question:   Supervising Provider    Answer:   Tresa Garter [id number]  . lactulose (CHRONULAC) 10 GM/15ML solution    Sig: Take 30 mLs (20 g total) by mouth 3 (three) times daily.    Dispense:  473 mL    Refill:  1    Order Specific Question:   Supervising Provider    Answer:   Tresa Garter [id number]    RTC: 6 weeks for chronic condition management.  A total of 40 minutes spent, greater than 50 % of this time was spent reviewing prior medical history, reviewing medications and indications of treatment, prior labs and diagnostic tests, discussing current plan of treatment, health promotion, and goals of treatment.  Carroll Sage. Kenton Kingfisher, MSN, FNP-C [facility]  [address] [place], [place] [address] [phone]

## 2018-01-12 NOTE — Patient Instructions (Addendum)
Gastroenterology, [facility] , please follow-up to schedule an [place] [phone]  [facility], please follow-up to schedule appointment [phone]  For nausea, I have prescribed Zofran 4 mg every 8 hours as needed.  Increase fluid intake of water and bland foods.  Rest. If symptoms worsen or do no improve, return for care or go to the nearest ED.  You appear to have a viral illness that should improve with rest.Le

## 2018-01-13 LAB — LIPID PANEL
CHOLESTEROL TOTAL: 210 mg/dL — AB (ref 100–199)
Chol/HDL Ratio: 2.7 ratio (ref 0.0–4.4)
HDL: 77 mg/dL (ref >39)
LDL Calculated: 114 mg/dL — ABNORMAL HIGH (ref 0–99)
TRIGLYCERIDES: 97 mg/dL (ref 0–149)
VLDL Cholesterol Cal: 19 mg/dL (ref 5–40)

## 2018-01-13 LAB — CBC WITH DIFFERENTIAL/PLATELET
Basophils Absolute: 0 10*3/uL (ref 0.0–0.2)
Basos: 0 %
EOS (ABSOLUTE): 0.1 10*3/uL (ref 0.0–0.4)
EOS: 2 %
HEMATOCRIT: 40 % (ref 34.0–46.6)
HEMOGLOBIN: 13.8 g/dL (ref 11.1–15.9)
Immature Grans (Abs): 0 10*3/uL (ref 0.0–0.1)
Immature Granulocytes: 0 %
LYMPHS ABS: 0.8 10*3/uL (ref 0.7–3.1)
Lymphs: 23 %
MCH: 33.3 pg — AB (ref 26.6–33.0)
MCHC: 34.5 g/dL (ref 31.5–35.7)
MCV: 96 fL (ref 79–97)
MONOCYTES: 10 %
MONOS ABS: 0.4 10*3/uL (ref 0.1–0.9)
NEUTROS ABS: 2.3 10*3/uL (ref 1.4–7.0)
Neutrophils: 65 %
RBC: 4.15 x10E6/uL (ref 3.77–5.28)
RDW: 13.9 % (ref 12.3–15.4)
WBC: 3.6 10*3/uL (ref 3.4–10.8)

## 2018-01-13 LAB — THYROID PANEL WITH TSH
FREE THYROXINE INDEX: 2.1 (ref 1.2–4.9)
T3 Uptake Ratio: 29 % (ref 24–39)
T4, Total: 7.1 ug/dL (ref 4.5–12.0)
TSH: 1.28 u[IU]/mL (ref 0.450–4.500)

## 2018-01-13 LAB — COMPREHENSIVE METABOLIC PANEL
ALBUMIN: 4.2 g/dL (ref 3.5–5.5)
ALK PHOS: 151 IU/L — AB (ref 39–117)
ALT: 21 IU/L (ref 0–32)
AST: 45 IU/L — ABNORMAL HIGH (ref 0–40)
Albumin/Globulin Ratio: 1.4 (ref 1.2–2.2)
BILIRUBIN TOTAL: 1.2 mg/dL (ref 0.0–1.2)
BUN / CREAT RATIO: 17 (ref 9–23)
BUN: 13 mg/dL (ref 6–24)
CHLORIDE: 101 mmol/L (ref 96–106)
CO2: 22 mmol/L (ref 20–29)
CREATININE: 0.77 mg/dL (ref 0.57–1.00)
Calcium: 9.3 mg/dL (ref 8.7–10.2)
GFR calc non Af Amer: 89 mL/min/{1.73_m2} (ref >59)
GFR, EST AFRICAN AMERICAN: 103 mL/min/{1.73_m2} (ref >59)
GLOBULIN, TOTAL: 3 g/dL (ref 1.5–4.5)
GLUCOSE: 111 mg/dL — AB (ref 65–99)
Potassium: 3.8 mmol/L (ref 3.5–5.2)
SODIUM: 139 mmol/L (ref 134–144)
TOTAL PROTEIN: 7.2 g/dL (ref 6.0–8.5)

## 2018-01-13 LAB — AMMONIA: AMMONIA: 134 ug/dL — AB (ref 19–87)

## 2018-01-15 ENCOUNTER — Telehealth: Payer: Self-pay | Admitting: Family Medicine

## 2018-01-15 MED ORDER — LACTULOSE 10 GM/15ML PO SOLN: 20.0000 g | Freq: Three times a day (TID) | ORAL | 1 refills | Status: DC

## 2018-01-15 NOTE — Telephone Encounter (Signed)
Contact patient to advise her ammonia level was critically elevated.  I am placing her on 20 g 3 times daily of lactulose.  I would like her to return on Friday for repeat ammonia level.  It is imperative that she follows up with gastroenterology because this is likely secondary to her worsening liver functioning.  I do not see that she has followed up with them since her last visit on Friday.  Carroll Sage. Kenton Kingfisher, MSN, FNP-C [facility]  [address] C[address] [place], [place] [address] [phone]

## 2018-01-15 NOTE — Telephone Encounter (Signed)
Tried to contact patient no answer and no vm 

## 2018-01-16 NOTE — Telephone Encounter (Signed)
Left a vm for patient to give a callback regarding labs

## 2018-01-17 NOTE — Telephone Encounter (Signed)
I have been unable to reach patient by phone so letter has been sent out for patient to contact office regarding results

## 2018-01-23 ENCOUNTER — Ambulatory Visit: Payer: Self-pay | Admitting: Family Medicine

## 2018-01-24 ENCOUNTER — Telehealth: Payer: Self-pay

## 2018-01-24 ENCOUNTER — Ambulatory Visit: Payer: Self-pay | Admitting: Family Medicine

## 2018-01-24 NOTE — Telephone Encounter (Signed)
Patient notified of labs results and will start medication

## 2018-01-26 ENCOUNTER — Ambulatory Visit (INDEPENDENT_AMBULATORY_CARE_PROVIDER_SITE_OTHER): Payer: Medicare Other | Admitting: Family Medicine

## 2018-01-26 ENCOUNTER — Encounter: Payer: Self-pay | Admitting: Family Medicine

## 2018-01-26 VITALS — BP 132/86 | HR 100 | Temp 97.6°F | Resp 16 | Ht 67.0 in | Wt 248.0 lb

## 2018-01-26 DIAGNOSIS — Z029 Encounter for administrative examinations, unspecified: Secondary | ICD-10-CM

## 2018-01-26 NOTE — Progress Notes (Signed)
Patient ID: Erin [name], female    DOB: October 04, 1966, 52 y.o.   MRN: [id number]  PCP: Scot Jun, FNP  Chief Complaint  Patient presents with  . paperwork for dog    Subjective:  HPI Erin [name] is a 52 y.o. female presents today requesting a letter stating that she needs a service dog. Her history significant for bipolar disorder, polysubstance abuse, history of suicidal ideations, and liver disease. She reports needing her dog  to accompany her to errands. She doesn't have any forms with her and requests a letter only. Social History   Socioeconomic History  . Marital status: Divorced    Spouse name: Not on file  . Number of children: Not on file  . Years of education: Not on file  . Highest education level: Not on file  Social Needs  . Financial resource strain: Not on file  . Food insecurity - worry: Not on file  . Food insecurity - inability: Not on file  . Transportation needs - medical: Not on file  . Transportation needs - non-medical: Not on file  Occupational History  . Not on file  Tobacco Use  . Smoking status: Current Every Day Smoker    Packs/day: 0.25    Types: Cigarettes  . Smokeless tobacco: Never Used  Substance and Sexual Activity  . Alcohol use: Yes    Comment: went 8 months without drinking but drank 2 today to try to feel better  . Drug use: Yes    Types: Marijuana  . Sexual activity: No  Other Topics Concern  . Not on file  Social History Narrative  . Not on file    Family History  Problem Relation Age of Onset  . Diabetes Mother   . Hypertension Mother   . Diabetes Daughter   . Hypertension Daughter      Review of Systems  See HPI  Patient Active Problem List   Diagnosis Date Noted  . Benzodiazepine abuse ([place]) 02/16/2017  . Substance induced mood disorder ([place]) 02/16/2017  . Alcohol use disorder, severe, dependence ([place]) 01/12/2016  . Cocaine dependence with cocaine-induced mood disorder ([place])   . Opioid  dependence with withdrawal ([place])   . Bipolar disorder ([place]) 02/09/2015  . Alcohol dependence with alcohol-induced mood disorder ([place])   . Suicidal ideations   . Opioid dependence ([place]) 05/06/2014  . Cocaine dependence ([place]) 07/17/2013  . Thrombocytopenia ([place]) 01/06/2013  . Sarcoidosis 03/30/2010  . HYPERTENSION, BENIGN ESSENTIAL 01/11/2010  . HYPOTHYROIDISM 03/02/2009  . ALCOHOLIC LIVER DISEASE 26/94/8546  . CIRRHOSIS, ALCOHOLIC, LIVER 27/02/5008  . TOBACCO DEPENDENCE 02/22/2007  . EXTERNAL HEMORRHOIDS 01/11/2005  . PORTAL HYPERTENSION 01/11/2005    Allergies  Allergen Reactions  . Fluoxetine Hcl Other (See Comments)     hyperactivity  . Metoclopramide Hcl Other (See Comments)    depression  . Morphine And Related Other (See Comments)    High doses cause hallucinations    Prior to Admission medications   Medication Sig Start Date End Date Taking? Authorizing Provider  budesonide-formoterol (SYMBICORT) 80-4.5 MCG/ACT inhaler Inhale 2 puffs into the lungs 2 (two) times daily. 01/12/18  Yes Scot Jun, FNP  hydrochlorothiazide (HYDRODIURIL) 25 MG tablet Take 1 tablet (25 mg total) by mouth daily. For high blood pressure 01/12/18  Yes Scot Jun, FNP  lactulose (CHRONULAC) 10 GM/15ML solution Take 30 mLs (20 g total) by mouth 3 (three) times daily. 01/15/18  Yes Scot Jun, FNP  montelukast (SINGULAIR) 10 MG tablet  Take 1 tablet (10 mg total) by mouth at bedtime. 01/12/18  Yes Scot Jun, FNP  ondansetron (ZOFRAN) 4 MG tablet Take 1 tablet (4 mg total) by mouth every 8 (eight) hours as needed for nausea or vomiting. 01/12/18  Yes Scot Jun, FNP  thyroid (ARMOUR) 30 MG tablet Take 1 tablet (30 mg total) by mouth daily before breakfast. For low thyroid function 01/12/18  Yes Scot Jun, FNP  traZODone (DESYREL) 50 MG tablet Take 1 tablet (25 mg) three times daily & 2 tablets (50 mg) at bedtime: For depression/insomnia 01/12/18  Yes Scot Jun, FNP  predniSONE (DELTASONE) 20 MG tablet Take 3 tablets (60 mg total) by mouth daily. Patient not taking: Reported on 01/26/2018 11/17/17   Lajean Saver, MD    Past Medical, Surgical Family and Social History reviewed and updated.    Objective:   Today's Vitals   01/26/18 1029  BP: 132/86  Pulse: 100  Resp: 16  Temp: 97.6 F (36.4 C)  TempSrc: Oral  SpO2: 98%  Weight: 248 lb (112.5 kg)  Height: 5\' 7"  (1.702 m)    Wt Readings from Last 3 Encounters:  01/26/18 248 lb (112.5 kg)  01/12/18 244 lb (110.7 kg)  09/26/17 238 lb (108 kg)    Physical Exam Deferred administrative paperwork only    Assessment & Plan:  1. Administrative encounter -Letter provided indicating patient would benefit from having a service dog.   Carroll Sage. Kenton Kingfisher, MSN, FNP-C [facility]  [address] [place], [place] [address] [phone]

## 2018-02-02 ENCOUNTER — Telehealth: Payer: Self-pay

## 2018-02-02 ENCOUNTER — Other Ambulatory Visit: Payer: Self-pay | Admitting: Family Medicine

## 2018-02-02 NOTE — Telephone Encounter (Signed)
Faxed request to [facility] for medical records.  Records should be routed to [facility] upon  Arrival. LM

## 2018-02-06 ENCOUNTER — Telehealth: Payer: Self-pay | Admitting: Internal Medicine

## 2018-02-10 ENCOUNTER — Other Ambulatory Visit: Payer: Self-pay

## 2018-02-10 ENCOUNTER — Encounter (HOSPITAL_COMMUNITY): Payer: Self-pay | Admitting: *Deleted

## 2018-02-10 ENCOUNTER — Ambulatory Visit (HOSPITAL_COMMUNITY)
Admission: EM | Admit: 2018-02-10 | Discharge: 2018-02-10 | Disposition: A | Discharge status: Home / Self Care | Payer: Medicare Other | Attending: Family Medicine | Admitting: Family Medicine

## 2018-02-10 DIAGNOSIS — H66002 Acute suppurative otitis media without spontaneous rupture of ear drum, left ear: Secondary | ICD-10-CM

## 2018-02-10 DIAGNOSIS — R112 Nausea with vomiting, unspecified: Secondary | ICD-10-CM

## 2018-02-10 MED ORDER — ONDANSETRON 4 MG PO TBDP: 4.0000 mg | ORAL_TABLET | Freq: Three times a day (TID) | ORAL | 0 refills | Status: DC | PRN

## 2018-02-10 MED ORDER — DICLOFENAC SODIUM 75 MG PO TBEC: 75.0000 mg | DELAYED_RELEASE_TABLET | Freq: Two times a day (BID) | ORAL | 0 refills | Status: DC

## 2018-02-10 MED ORDER — AMOXICILLIN-POT CLAVULANATE 875-125 MG PO TABS: 1.0000 | ORAL_TABLET | Freq: Two times a day (BID) | ORAL | 0 refills | Status: DC

## 2018-02-10 NOTE — ED Provider Notes (Signed)
[place]   [id number] 02/10/18 Arrival Time: 4650  ASSESSMENT & PLAN:  1. Acute suppurative otitis media of left ear without spontaneous rupture of tympanic membrane, recurrence not specified    Meds ordered this encounter  Medications  . amoxicillin-clavulanate (AUGMENTIN) 875-125 MG tablet    Sig: Take 1 tablet by mouth every 12 (twelve) hours.    Dispense:  20 tablet    Refill:  0  . diclofenac (VOLTAREN) 75 MG EC tablet    Sig: Take 1 tablet (75 mg total) by mouth 2 (two) times daily.    Dispense:  14 tablet    Refill:  0   Will f/u with PCP if not seeing improvement over the next 48-72 hours, here if needed.  Reviewed expectations re: course of current medical issues. Questions answered. Outlined signs and symptoms indicating need for more acute intervention. Patient verbalized understanding. After Visit Summary given.   SUBJECTIVE: History from: patient.  Erin [name] is a 52 y.o. female who presents with complaint of left otalgia with mild "yellowish" drainage. Onset gradual, approximately a few days ago. Recent cold symptoms: none. Did have 3 teeth pulled on L side 02/08/2018, "mouth feels ok." Fever: no. Overall normal PO intake without n/v. Sick contacts: no. OTC treatment: Ibuprofen with some relief. Decreased hearing on R.  Social History   Tobacco Use  Smoking Status Current Every Day Smoker  . Packs/day: 0.25  . Types: Cigarettes  Smokeless Tobacco Never Used    ROS: As per HPI.   OBJECTIVE:  Vitals:   02/10/18 1209  BP: 130/88  Pulse: 86  Temp: 97.8 F (36.6 C)  TempSrc: Oral  SpO2: 98%     General appearance: alert; appears fatigued Mouth: gums normal without bleeding or erythema Ear Canal: normal TM: left: erythematous, bulging; no obvious rupture; no bleeding Neck: supple without LAD Lungs: unlabored respirations, symmetrical air entry; no respiratory distress Skin: warm and dry Psychological: alert and cooperative;  normal mood and affect  Allergies  Allergen Reactions  . Fluoxetine Hcl Other (See Comments)     hyperactivity  . Metoclopramide Hcl Other (See Comments)    depression  . Morphine And Related Other (See Comments)    High doses cause hallucinations    Past Medical History:  Diagnosis Date  . Anxiety   . Bipolar 1 disorder ([place])   . Blood transfusion July 2012  . Bronchitis   . Cirrhosis of liver ([place])   . Depression   . History of sarcoidosis   . Hypothyroidism   . Liver failure ([place])   . Menorrhagia   . Wegener's granulomatosis ([place])    Family History  Problem Relation Age of Onset  . Diabetes Mother   . Hypertension Mother   . Diabetes Daughter   . Hypertension Daughter    Social History   Socioeconomic History  . Marital status: Divorced    Spouse name: Not on file  . Number of children: Not on file  . Years of education: Not on file  . Highest education level: Not on file  Social Needs  . Financial resource strain: Not on file  . Food insecurity - worry: Not on file  . Food insecurity - inability: Not on file  . Transportation needs - medical: Not on file  . Transportation needs - non-medical: Not on file  Occupational History  . Not on file  Tobacco Use  . Smoking status: Current Every Day Smoker    Packs/day: 0.25  Types: Cigarettes  . Smokeless tobacco: Never Used  Substance and Sexual Activity  . Alcohol use: Yes    Comment: went 8 months without drinking but drank 2 today to try to feel better  . Drug use: Yes    Types: Marijuana  . Sexual activity: No  Other Topics Concern  . Not on file  Social History Narrative  . Not on file            Vanessa Kick, MD 02/10/18 1240

## 2018-02-10 NOTE — ED Triage Notes (Addendum)
Per pt she had 3 tooth pulls on Feb14, per pt she is having nasal drainage and congested, her ear is aching, per pt she feels like she's hearing from a closed box

## 2018-02-10 NOTE — Discharge Instructions (Signed)
Please follow up with your primary doctor or here if you are not seeing significant improvement over the next 2-3 days.

## 2018-02-12 ENCOUNTER — Ambulatory Visit (INDEPENDENT_AMBULATORY_CARE_PROVIDER_SITE_OTHER): Payer: Medicare Other | Admitting: Family Medicine

## 2018-02-12 ENCOUNTER — Encounter: Payer: Self-pay | Admitting: Family Medicine

## 2018-02-12 VITALS — BP 110/72 | HR 74 | Temp 97.6°F | Resp 16 | Ht 67.0 in | Wt 260.0 lb

## 2018-02-12 DIAGNOSIS — R7989 Other specified abnormal findings of blood chemistry: Secondary | ICD-10-CM

## 2018-02-12 DIAGNOSIS — R059 Cough, unspecified: Secondary | ICD-10-CM

## 2018-02-12 DIAGNOSIS — K7030 Alcoholic cirrhosis of liver without ascites: Secondary | ICD-10-CM

## 2018-02-12 DIAGNOSIS — R05 Cough: Secondary | ICD-10-CM

## 2018-02-12 MED ORDER — BENZONATATE 100 MG PO CAPS: 100.0000 mg | ORAL_CAPSULE | Freq: Three times a day (TID) | ORAL | 0 refills | Status: DC | PRN

## 2018-02-12 MED ORDER — TRAZODONE HCL 150 MG PO TABS: ORAL_TABLET | ORAL | 1 refills | Status: DC

## 2018-02-12 MED ORDER — NAPROXEN 500 MG PO TABS: 500.0000 mg | ORAL_TABLET | Freq: Two times a day (BID) | ORAL | 0 refills | Status: DC

## 2018-02-12 NOTE — Telephone Encounter (Signed)
Dr. Carlean Purl reviewed records from [facility] and declined to accept patient.  Patient notified

## 2018-02-12 NOTE — Patient Instructions (Addendum)
Please take the cough medicine as prescribed for your cough. Do warm compresses and naproxen for facial pain relief.Continue to take your antibiotics as prescribed for your recent ear infection. Increase your trazadone to 75 mg during th day and take150mg -300 at bedtime, and you may also take 25 of benadryl at night to help with sleep. Please follow up in office if 3 months.  Sinus Headache A sinus headache happens when your sinuses become clogged or swollen. You may feel pain or pressure in your face, forehead, ears, or upper teeth. Sinus headaches can be mild or severe. Follow these instructions at home:  Take medicines only as told by your doctor.  If you were given an antibiotic medicine, finish all of it even if you start to feel better.  Use a nose spray if you feel stuffed up (congested).  If told, apply a warm, moist washcloth to your face to help lessen pain. Contact a doctor if:  You get headaches more than one time each week.  Light or sound bothers you.  You have a fever.  You feel sick to your stomach (nauseous) or you throw up (vomit).  Your headaches do not get better with treatment. Get help right away if:  You have trouble seeing.  You suddenly have very bad pain in your face or head.  You start to twitch or shake (seizure).  You are confused.  You have a stiff neck. This information is not intended to replace advice given to you by your health care provider. Make sure you discuss any questions you have with your health care provider. Document Released: 04/13/2011 Document Revised: 08/07/2016 Document Reviewed: 12/08/2014 Elsevier Interactive Patient Education  2018 [place].  Cough, Adult A cough helps to clear your throat and lungs. A cough may last only 2-3 weeks (acute), or it may last longer than 8 weeks (chronic). Many different things can cause a cough. A cough may be a sign of an illness or another medical condition. Follow these instructions at  home:  Pay attention to any changes in your cough.  Take medicines only as told by your doctor. ? If you were prescribed an antibiotic medicine, take it as told by your doctor. Do not stop taking it even if you start to feel better. ? Talk with your doctor before you try using a cough medicine.  Drink enough fluid to keep your pee (urine) clear or pale yellow.  If the air is dry, use a cold steam vaporizer or humidifier in your home.  Stay away from things that make you cough at work or at home.  If your cough is worse at night, try using extra pillows to raise your head up higher while you sleep.  Do not smoke, and try not to be around smoke. If you need help quitting, ask your doctor.  Do not have caffeine.  Do not drink alcohol.  Rest as needed. Contact a doctor if:  You have new problems (symptoms).  You cough up yellow fluid (pus).  Your cough does not get better after 2-3 weeks, or your cough gets worse.  Medicine does not help your cough and you are not sleeping well.  You have pain that gets worse or pain that is not helped with medicine.  You have a fever.  You are losing weight and you do not know why.  You have night sweats. Get help right away if:  You cough up blood.  You have trouble breathing.  Your heartbeat  is very fast. This information is not intended to replace advice given to you by your health care provider. Make sure you discuss any questions you have with your health care provider. Document Released: 08/25/2011 Document Revised: 05/19/2016 Document Reviewed: 02/18/2015 Elsevier Interactive Patient Education  [employer].

## 2018-02-12 NOTE — Progress Notes (Signed)
Patient ID: Erin [name], female    DOB: [birth year], 52 y.o.   MRN: [id number]  PCP: Scot Jun, FNP  Chief Complaint  Patient presents with  . Follow-up    6 weeks on chronic condition    Subjective:  HPI Erin [name] is a 52 y.o. female with medical history significant for polysubstance abuse, alcoholic liver disease, sarcoidosis,  portal hypertension, hypothyroidism, bipolar disorder and tobacco use presents for a repeat ammonia level and evaluation of ear pain. [name] immediately began requesting narcotic pain medication at the start of the visit, requesting stronger medication for anxiety, andrequesting medication stronger for sleep. [name] has previously been referred to behavioral health, however has failed to follow-up to schedule an appointment. [name] was found to have an abnormally elevated ammonia level 01/12/2018 at a level of 134. She reports completing lactulose as prescribed. Denies syncope, weakness,or forgetfulness. She continues to experience left ear pain secondary to otitis media. She presented at [facility] for this problems on 02/10/2018 and prescribed Augmentin for which she has been taking for 2 days. Social History   Socioeconomic History  . Marital status: Divorced    Spouse name: Not on file  . Number of children: Not on file  . Years of education: Not on file  . Highest education level: Not on file  Social Needs  . Financial resource strain: Not on file  . Food insecurity - worry: Not on file  . Food insecurity - inability: Not on file  . Transportation needs - medical: Not on file  . Transportation needs - non-medical: Not on file  Occupational History  . Not on file  Tobacco Use  . Smoking status: Current Every Day Smoker    Packs/day: 0.25    Types: Cigarettes  . Smokeless tobacco: Never Used  Substance and Sexual Activity  . Alcohol use: Yes    Comment: went 8 months without drinking but drank 2 today to try  to feel better  . Drug use: Yes    Types: Marijuana  . Sexual activity: No  Other Topics Concern  . Not on file  Social History Narrative  . Not on file    Family History  Problem Relation Age of Onset  . Diabetes Mother   . Hypertension Mother   . Diabetes Daughter   . Hypertension Daughter    Review of Systems  Patient Active Problem List   Diagnosis Date Noted  . Benzodiazepine abuse ([place]) 02/16/2017  . Substance induced mood disorder ([place]) 02/16/2017  . Alcohol use disorder, severe, dependence ([place]) 01/12/2016  . Cocaine dependence with cocaine-induced mood disorder ([place])   . Opioid dependence with withdrawal ([place])   . Bipolar disorder ([place]) 02/09/2015  . Alcohol dependence with alcohol-induced mood disorder ([place])   . Suicidal ideations   . Opioid dependence ([place]) 05/06/2014  . Cocaine dependence ([place]) 07/17/2013  . Thrombocytopenia ([place]) 01/06/2013  . Sarcoidosis 03/30/2010  . HYPERTENSION, BENIGN ESSENTIAL 01/11/2010  . HYPOTHYROIDISM 03/02/2009  . ALCOHOLIC LIVER DISEASE 40/98/1191  . CIRRHOSIS, ALCOHOLIC, LIVER 47/82/9562  . TOBACCO DEPENDENCE 02/22/2007  . EXTERNAL HEMORRHOIDS 01/11/2005  . PORTAL HYPERTENSION 01/11/2005    Allergies  Allergen Reactions  . Fluoxetine Hcl Other (See Comments)     hyperactivity  . Metoclopramide Hcl Other (See Comments)    depression  . Morphine And Related Other (See Comments)    High doses cause hallucinations    Prior to Admission medications   Medication Sig Start Date End  Date Taking? Authorizing Provider  amoxicillin-clavulanate (AUGMENTIN) 875-125 MG tablet Take 1 tablet by mouth every 12 (twelve) hours. 02/10/18  Yes Hagler, Aaron Edelman, MD  budesonide-formoterol (SYMBICORT) 80-4.5 MCG/ACT inhaler Inhale 2 puffs into the lungs 2 (two) times daily. 01/12/18  Yes Scot Jun, FNP  diclofenac (VOLTAREN) 75 MG EC tablet Take 1 tablet (75 mg total) by mouth 2 (two) times daily. 02/10/18  Yes Hagler, Aaron Edelman, MD   hydrochlorothiazide (HYDRODIURIL) 25 MG tablet Take 1 tablet (25 mg total) by mouth daily. For high blood pressure 01/12/18  Yes Scot Jun, FNP  lactulose (CHRONULAC) 10 GM/15ML solution Take 30 mLs (20 g total) by mouth 3 (three) times daily. 02/02/18  Yes Scot Jun, FNP  montelukast (SINGULAIR) 10 MG tablet Take 1 tablet (10 mg total) by mouth at bedtime. 01/12/18  Yes Scot Jun, FNP  ondansetron (ZOFRAN-ODT) 4 MG disintegrating tablet Take 1 tablet (4 mg total) by mouth every 8 (eight) hours as needed for nausea or vomiting. 02/10/18  Yes Vanessa Kick, MD  thyroid (ARMOUR) 30 MG tablet Take 1 tablet (30 mg total) by mouth daily before breakfast. For low thyroid function 01/12/18  Yes Scot Jun, FNP  traZODone (DESYREL) 50 MG tablet Take 1 tablet (25 mg) three times daily & 2 tablets (50 mg) at bedtime: For depression/insomnia 01/12/18  Yes Scot Jun, FNP  acetaminophen-codeine (TYLENOL #3) 300-30 MG tablet Take 1 tablet by mouth every 6 (six) hours. 02/09/18   [provider]  benzonatate (TESSALON) 100 MG capsule Take 1-2 capsules (100-200 mg total) by mouth 3 (three) times daily as needed for up to 60 doses for cough. 02/12/18   Scot Jun, FNP  polyethylene glycol powder (GLYCOLAX/MIRALAX) powder Take 1 Container by mouth as needed. 02/06/18   [provider]  predniSONE (DELTASONE) 20 MG tablet Take 3 tablets (60 mg total) by mouth daily. Patient not taking: Reported on 01/26/2018 11/17/17   Lajean Saver, MD    Past Medical, Surgical Family and Social History reviewed and updated.    Objective:   Today's Vitals   02/12/18 1355  BP: 110/72  Pulse: 74  Resp: 16  Temp: 97.6 F (36.4 C)  TempSrc: Oral  SpO2: 98%  Weight: 260 lb (117.9 kg)  Height: 5\' 7"  (1.702 m)    Wt Readings from Last 3 Encounters:  02/12/18 260 lb (117.9 kg)  01/26/18 248 lb (112.5 kg)  01/12/18 244 lb (110.7 kg)   Physical Exam   Constitutional: She appears well-developed and well-nourished.  HENT:  Right Ear: Hearing, tympanic membrane, external ear and ear canal normal.  Left Ear: Hearing, tympanic membrane, external ear and ear canal normal.  Nose: Nose normal.  Mouth/Throat: Uvula is midline and oropharynx is clear and moist.  Cardiovascular: Normal rate, regular rhythm, normal heart sounds and intact distal pulses.  Pulmonary/Chest: Effort normal and breath sounds normal. No respiratory distress. She has no wheezes. She has no rales.  Psychiatric: Her speech is normal. Thought content normal. Her mood appears anxious. She is hyperactive. She expresses impulsivity.   Assessment & Plan:  1. Increased ammonia level, will repeat an ammonia level today.  2. Alcoholic cirrhosis, unspecified whether ascites present [facility]), follow-up with GI at [facility]. 3. Cough,likely secondary to viral URI, currently prescribed a broad spectrum antibiotic for otitis media. Will treat cough with  benzonatate (TESSALON) 100 MG capsule; Take 1-2 capsules (100-200 mg total) by mouth 3 (three) times daily as needed for up for  cough.   -Patient was advised that she will not receive any controlled medications from this practice. She verbalized understanding. 4. Insomnia-increased trazodone 150-300 mg as needed to induce sleep.  Meds ordered this encounter  Medications  . benzonatate (TESSALON) 100 MG capsule    Sig: Take 1-2 capsules (100-200 mg total) by mouth 3 (three) times daily as needed for up to 60 doses for cough.    Dispense:  20 capsule    Refill:  0  . traZODone (DESYREL) 150 MG tablet    Sig: Take 1/2 tablet 75 mg twice daily for depression and for insomnia take 1-2 tablets 150-300 mg at night for sleep.    Dispense:  60 tablet    Refill:  1    Order Specific Question:   Supervising Provider    Answer:   Tresa Garter [id number]  . naproxen (NAPROSYN) 500 MG tablet    Sig: Take 1 tablet (500 mg total) by mouth 2 (two)  times daily with a meal.    Dispense:  30 tablet    Refill:  0    Order Specific Question:   Supervising Provider    Answer:   Tresa Garter [[id number]]     RTC: 3 months chronic conditions    Carroll Sage. Kenton Kingfisher, MSN, FNP-C [facility]  [address] [place], [place] [address] [phone]

## 2018-02-13 LAB — AMMONIA: Ammonia: 73 ug/dL (ref 19–87)

## 2018-02-20 ENCOUNTER — Emergency Department (HOSPITAL_COMMUNITY)
Admission: EM | Admit: 2018-02-20 | Discharge: 2018-02-20 | Discharge status: Against Medical Advice | Payer: Medicare Other

## 2018-02-20 ENCOUNTER — Emergency Department (HOSPITAL_COMMUNITY)
Admission: EM | Admit: 2018-02-20 | Discharge: 2018-02-20 | Discharge status: Against Medical Advice | Payer: Medicare Other | Attending: Emergency Medicine | Admitting: Emergency Medicine

## 2018-02-20 ENCOUNTER — Other Ambulatory Visit: Payer: Self-pay | Admitting: Family Medicine

## 2018-02-20 ENCOUNTER — Other Ambulatory Visit: Payer: Self-pay

## 2018-02-20 DIAGNOSIS — M5442 Lumbago with sciatica, left side: Secondary | ICD-10-CM

## 2018-02-20 DIAGNOSIS — M542 Cervicalgia: Secondary | ICD-10-CM | POA: Diagnosis present

## 2018-02-20 DIAGNOSIS — M549 Dorsalgia, unspecified: Secondary | ICD-10-CM | POA: Insufficient documentation

## 2018-02-20 DIAGNOSIS — Z5321 Procedure and treatment not carried out due to patient leaving prior to being seen by health care provider: Secondary | ICD-10-CM | POA: Insufficient documentation

## 2018-02-20 DIAGNOSIS — G8929 Other chronic pain: Secondary | ICD-10-CM

## 2018-02-20 DIAGNOSIS — M5441 Lumbago with sciatica, right side: Secondary | ICD-10-CM

## 2018-02-20 NOTE — ED Notes (Addendum)
No answer in waiting room for recheck of vitals

## 2018-02-20 NOTE — ED Notes (Signed)
Pt. Just came back into the hospital and stated, I had to leave and now Im back. I need a MRI on my neck and back/
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
## 2018-02-21 ENCOUNTER — Other Ambulatory Visit: Payer: Self-pay

## 2018-02-22 ENCOUNTER — Other Ambulatory Visit: Payer: Self-pay | Admitting: Gastroenterology

## 2018-02-22 DIAGNOSIS — K7030 Alcoholic cirrhosis of liver without ascites: Secondary | ICD-10-CM

## 2018-03-01 ENCOUNTER — Other Ambulatory Visit: Payer: Self-pay | Admitting: Family Medicine

## 2018-03-05 ENCOUNTER — Ambulatory Visit
Admission: RE | Admit: 2018-03-05 | Discharge: 2018-03-05 | Disposition: A | Discharge status: Home / Self Care | Payer: Medicare Other | Source: Ambulatory Visit | Attending: Gastroenterology | Admitting: Gastroenterology

## 2018-03-05 DIAGNOSIS — K7030 Alcoholic cirrhosis of liver without ascites: Secondary | ICD-10-CM

## 2018-03-06 DIAGNOSIS — E039 Hypothyroidism, unspecified: Secondary | ICD-10-CM | POA: Insufficient documentation

## 2018-03-06 DIAGNOSIS — D72819 Decreased white blood cell count, unspecified: Secondary | ICD-10-CM | POA: Insufficient documentation

## 2018-03-06 DIAGNOSIS — F17210 Nicotine dependence, cigarettes, uncomplicated: Secondary | ICD-10-CM | POA: Diagnosis not present

## 2018-03-06 DIAGNOSIS — Z79899 Other long term (current) drug therapy: Secondary | ICD-10-CM | POA: Diagnosis not present

## 2018-03-06 DIAGNOSIS — D696 Thrombocytopenia, unspecified: Secondary | ICD-10-CM | POA: Diagnosis not present

## 2018-03-06 DIAGNOSIS — N179 Acute kidney failure, unspecified: Secondary | ICD-10-CM | POA: Diagnosis not present

## 2018-03-06 DIAGNOSIS — I10 Essential (primary) hypertension: Secondary | ICD-10-CM | POA: Insufficient documentation

## 2018-03-06 DIAGNOSIS — F430 Acute stress reaction: Secondary | ICD-10-CM | POA: Insufficient documentation

## 2018-03-06 DIAGNOSIS — R441 Visual hallucinations: Secondary | ICD-10-CM | POA: Insufficient documentation

## 2018-03-06 DIAGNOSIS — R440 Auditory hallucinations: Secondary | ICD-10-CM | POA: Diagnosis present

## 2018-03-06 LAB — RAPID URINE DRUG SCREEN, HOSP PERFORMED
Amphetamines: NOT DETECTED
BENZODIAZEPINES: POSITIVE — AB
Barbiturates: NOT DETECTED
COCAINE: NOT DETECTED
OPIATES: NOT DETECTED
Tetrahydrocannabinol: POSITIVE — AB

## 2018-03-07 ENCOUNTER — Emergency Department (HOSPITAL_COMMUNITY)
Admission: EM | Admit: 2018-03-07 | Discharge: 2018-03-08 | Disposition: A | Discharge status: Home / Self Care | Payer: Medicare Other | Attending: Emergency Medicine | Admitting: Emergency Medicine

## 2018-03-07 ENCOUNTER — Encounter (HOSPITAL_COMMUNITY): Payer: Self-pay | Admitting: Emergency Medicine

## 2018-03-07 ENCOUNTER — Emergency Department (HOSPITAL_COMMUNITY)
Admission: EM | Admit: 2018-03-07 | Discharge: 2018-03-07 | Discharge status: Against Medical Advice | Payer: Medicare Other

## 2018-03-07 DIAGNOSIS — D696 Thrombocytopenia, unspecified: Secondary | ICD-10-CM

## 2018-03-07 DIAGNOSIS — F430 Acute stress reaction: Secondary | ICD-10-CM | POA: Diagnosis not present

## 2018-03-07 DIAGNOSIS — N179 Acute kidney failure, unspecified: Secondary | ICD-10-CM

## 2018-03-07 DIAGNOSIS — D72819 Decreased white blood cell count, unspecified: Secondary | ICD-10-CM

## 2018-03-07 LAB — COMPREHENSIVE METABOLIC PANEL
ALK PHOS: 98 U/L (ref 38–126)
ALT: 34 U/L (ref 14–54)
ANION GAP: 10 (ref 5–15)
AST: 53 U/L — ABNORMAL HIGH (ref 15–41)
Albumin: 3.9 g/dL (ref 3.5–5.0)
BILIRUBIN TOTAL: 0.9 mg/dL (ref 0.3–1.2)
BUN: 24 mg/dL — ABNORMAL HIGH (ref 6–20)
CALCIUM: 9.4 mg/dL (ref 8.9–10.3)
CO2: 28 mmol/L (ref 22–32)
Chloride: 99 mmol/L — ABNORMAL LOW (ref 101–111)
Creatinine, Ser: 1.41 mg/dL — ABNORMAL HIGH (ref 0.44–1.00)
GFR calc Af Amer: 48 mL/min — ABNORMAL LOW (ref >60)
GFR, EST NON AFRICAN AMERICAN: 42 mL/min — AB (ref >60)
Glucose, Bld: 92 mg/dL (ref 65–99)
POTASSIUM: 3.9 mmol/L (ref 3.5–5.1)
Sodium: 137 mmol/L (ref 135–145)
TOTAL PROTEIN: 7 g/dL (ref 6.5–8.1)

## 2018-03-07 LAB — CBC WITH DIFFERENTIAL/PLATELET
Basophils Absolute: 0 10*3/uL (ref 0.0–0.1)
Basophils Relative: 0 %
Eosinophils Absolute: 0.1 10*3/uL (ref 0.0–0.7)
Eosinophils Relative: 4 %
HCT: 38.9 % (ref 36.0–46.0)
HEMOGLOBIN: 12.8 g/dL (ref 12.0–15.0)
LYMPHS ABS: 0.9 10*3/uL (ref 0.7–4.0)
LYMPHS PCT: 30 %
MCH: 31.9 pg (ref 26.0–34.0)
MCHC: 32.9 g/dL (ref 30.0–36.0)
MCV: 97 fL (ref 78.0–100.0)
MONO ABS: 0.2 10*3/uL (ref 0.1–1.0)
MONOS PCT: 8 %
NEUTROS ABS: 1.7 10*3/uL (ref 1.7–7.7)
Neutrophils Relative %: 58 %
Platelets: 88 10*3/uL — ABNORMAL LOW (ref 150–400)
RBC: 4.01 MIL/uL (ref 3.87–5.11)
RDW: 12.6 % (ref 11.5–15.5)
WBC: 2.9 10*3/uL — ABNORMAL LOW (ref 4.0–10.5)

## 2018-03-07 LAB — RAPID URINE DRUG SCREEN, HOSP PERFORMED
Amphetamines: NOT DETECTED
BENZODIAZEPINES: POSITIVE — AB
Barbiturates: NOT DETECTED
Cocaine: NOT DETECTED
OPIATES: NOT DETECTED
Tetrahydrocannabinol: POSITIVE — AB

## 2018-03-07 LAB — PROTIME-INR
INR: 1.01
Prothrombin Time: 13.2 seconds (ref 11.4–15.2)

## 2018-03-07 LAB — ETHANOL: Alcohol, Ethyl (B): <10 mg/dL (ref <10)

## 2018-03-07 LAB — AMMONIA: AMMONIA: 23 umol/L (ref 9–35)

## 2018-03-07 MED ORDER — LACTULOSE 10 GM/15ML PO SOLN
20.0000 g | Freq: Three times a day (TID) | ORAL | Status: DC
  Administered 2018-03-07 (×3): 20 g via ORAL
  Filled 2018-03-07 (×5): qty 30

## 2018-03-07 MED ORDER — HYDROCHLOROTHIAZIDE 25 MG PO TABS: 25.0000 mg | ORAL_TABLET | Freq: Every day | ORAL | Status: DC

## 2018-03-07 MED ORDER — HYDROXYZINE HCL 25 MG PO TABS
25.0000 mg | ORAL_TABLET | Freq: Once | ORAL | Status: AC
  Administered 2018-03-07: 25 mg via ORAL
  Filled 2018-03-07: qty 1

## 2018-03-07 MED ORDER — LEVOTHYROXINE SODIUM 50 MCG PO TABS
50.0000 ug | ORAL_TABLET | Freq: Every day | ORAL | Status: DC
  Administered 2018-03-07 – 2018-03-08 (×2): 50 ug via ORAL
  Filled 2018-03-07 (×2): qty 1

## 2018-03-07 MED ORDER — DIPHENHYDRAMINE HCL 25 MG PO CAPS
50.0000 mg | ORAL_CAPSULE | Freq: Once | ORAL | Status: AC
  Administered 2018-03-07: 50 mg via ORAL
  Filled 2018-03-07: qty 2

## 2018-03-07 MED ORDER — MONTELUKAST SODIUM 10 MG PO TABS
10.0000 mg | ORAL_TABLET | Freq: Every day | ORAL | Status: DC
  Administered 2018-03-07: 10 mg via ORAL
  Filled 2018-03-07: qty 1

## 2018-03-07 MED ORDER — IBUPROFEN 200 MG PO TABS: 600.0000 mg | ORAL_TABLET | Freq: Three times a day (TID) | ORAL | Status: DC | PRN

## 2018-03-07 MED ORDER — SODIUM CHLORIDE 0.9 % IV BOLUS (SEPSIS)
1000.0000 mL | Freq: Once | INTRAVENOUS | Status: AC
  Administered 2018-03-07: 1000 mL via INTRAVENOUS

## 2018-03-07 MED ORDER — ONDANSETRON HCL 4 MG PO TABS: 4.0000 mg | ORAL_TABLET | Freq: Three times a day (TID) | ORAL | Status: DC | PRN

## 2018-03-07 MED ORDER — LORAZEPAM 1 MG PO TABS
1.0000 mg | ORAL_TABLET | Freq: Once | ORAL | Status: AC
  Administered 2018-03-07: 1 mg via ORAL
  Filled 2018-03-07: qty 1

## 2018-03-07 NOTE — ED Triage Notes (Signed)
Patient requesting psychiatric treatment for her insomnia , anxiety , depression and bipolar disease , denies suicidal ideation / no hallucinations .

## 2018-03-07 NOTE — Discharge Instructions (Addendum)
It is very important that she follow-up with Dr. Vista Lawman in 1 week.  You will need to get a recheck of a complete blood count to check your white blood cell count and your platelet count.  You will also need to get your kidney function rechecked.  I suspect that your change in kidney function was due to dehydration today.

## 2018-03-07 NOTE — ED Notes (Signed)
Pt is eating dinner at this time.  No distress

## 2018-03-07 NOTE — [facility] Counselor (Signed)
Disposition:   Per Shuvon Rankin, NP, patient is recommended for inpt treatment

## 2018-03-07 NOTE — ED Notes (Signed)
Called patient to be triage and no answer.

## 2018-03-07 NOTE — ED Notes (Signed)
Pt has not received the dinner tray.  Call to check on this.  Dinner tray will be delivered

## 2018-03-07 NOTE — ED Notes (Signed)
Patient went to [facility] to be seen.

## 2018-03-07 NOTE — BH Assessment (Signed)
Tele Assessment Note   Patient Name: Erin [name] MRN: [id number] Referring Physician: Tamala Julian Location of Patient: [facility] Location of Provider: [place] is an 52 y.o. female present to [facility] with worsening depression and auditory / visual hallucinations. Patient has a history of anxiety, Bi-polar 1 and alcohol use disorder. Patient depressive mood is triggered by recent loses of her boyfriend Feb. 2018, Mother 10/13/2017 and Step-father Dec. 2018. Patient reports feeling paranoid because she's all alone for the 1st time in her life (youngest of 7 children). Report hearing nonspecific voices that are directing her to 'drink again' patient report she has been sober for one month and seeing shadows of people coming of the doors in her home. Report decreased sleep with complaints that she has not slept in 48 hours.   Patient reports that she feels manic at this time, and she has had previous psychiatric hospitalizations for mania, last 1.5-2 years ago. Reports she is not currently treated with mood stabilizing medications.  Patient denies suicidal / homicidal ideations.    Diagnosis: F31.5   Bipolar I disorder, Current or most recent episode depressed, With psychotic features  Past Medical History:  Past Medical History:  Diagnosis Date  . Anxiety   . Bipolar 1 disorder ([place])   . Blood transfusion July 2012  . Bronchitis   . Cirrhosis of liver ([place])   . Depression   . History of sarcoidosis   . Hypothyroidism   . Liver failure ([place])   . Menorrhagia   . Wegener's granulomatosis ([place])     Past Surgical History:  Procedure Laterality Date  . CHOLECYSTECTOMY    . FRACTURE SURGERY    . HERNIA REPAIR    . LAPAROSCOPY    . LAPAROTOMY  07/28/2011   Procedure: EXPLORATORY LAPAROTOMY;  Surgeon: [name], MD;  Location: [facility];  Service: Gynecology;  Laterality: N/A;  . ORTHOPEDIC SURGERY    . TUBAL LIGATION    . VAGINAL  HYSTERECTOMY  07/27/2011   Procedure: HYSTERECTOMY VAGINAL;  Surgeon: [name], MD;  Location: [facility];  Service: Gynecology;  Laterality: N/A;  Vaginal Hysterectomy with Right Oophorectomy    Family History:  Family History  Problem Relation Age of Onset  . Diabetes Mother   . Hypertension Mother   . Diabetes Daughter   . Hypertension Daughter     Social History:  reports that she has been smoking cigarettes.  She has been smoking about 0.25 packs per day. she has never used smokeless tobacco. She reports that she drinks alcohol. She reports that she uses drugs. Drug: Marijuana.  Additional Social History:  Alcohol / Drug Use Pain Medications: see MAR Prescriptions: see MAR Over the Counter: see MAR History of alcohol / drug use?: Yes Substance #1 Name of Substance 1: Alcohol  1 - Age of First Use: 12 1 - Amount (size/oz): unknown 1 - Frequency: pt report has been sober for 12-month 1 - Last Use / Amount: 67-month ago   CIWA: CIWA-Ar BP: 115/73 Pulse Rate: 67 COWS:    Allergies:  Allergies  Allergen Reactions  . Fluoxetine Hcl Other (See Comments)     hyperactivity  . Metoclopramide Hcl Other (See Comments)    depression  . Morphine And Related Other (See Comments)    High doses cause hallucinations    Home Medications:  (Not in a hospital admission)  OB/GYN Status:  No LMP recorded. Patient has had a hysterectomy.  General Assessment Data Location of Assessment: [facility] TTS Assessment: In system Is this a Tele or Face-to-Face Assessment?: Tele Assessment Is this an Initial Assessment or a Re-assessment for this encounter?: Initial Assessment Marital status: Single Is patient pregnant?: No Pregnancy Status: No Living Arrangements: Alone Can pt return to current living arrangement?: Yes Admission Status: Voluntary Is patient capable of signing voluntary admission?: Yes Referral Source: Self/Family/Friend Insurance type: [employer] Medicare      Crisis Care Plan Living Arrangements: Alone Name of Psychiatrist: none known Name of Therapist: none known   Education Status Is patient currently in school?: No Is the patient employed, unemployed or receiving disability?: Receiving disability income  Risk to self with the past 6 months Suicidal Ideation: No Has patient been a risk to self within the past 6 months prior to admission? : No Suicidal Intent: No Has patient had any suicidal intent within the past 6 months prior to admission? : No Is patient at risk for suicide?: No Suicidal Plan?: No Has patient had any suicidal plan within the past 6 months prior to admission? : No Access to Means: No What has been your use of drugs/alcohol within the last 12 months?: alcohol , THC Previous Attempts/Gestures: No How many times?: 0 Other Self Harm Risks: none report Triggers for Past Attempts: None known Intentional Self Injurious Behavior: None Family Suicide History: No Recent stressful life event(s): Loss (Comment)(loss of mother, boyfriend and step-father) Persecutory voices/beliefs?: Yes Depression: Yes Depression Symptoms: Insomnia, Tearfulness, Feeling worthless/self pity, Loss of interest in usual pleasures, Fatigue Substance abuse history and/or treatment for substance abuse?: Yes Suicide prevention information given to non-admitted patients: Not applicable  Risk to Others within the past 6 months Homicidal Ideation: No Does patient have any lifetime risk of violence toward others beyond the six months prior to admission? : No Thoughts of Harm to Others: No Current Homicidal Intent: No Current Homicidal Plan: No Access to Homicidal Means: No Identified Victim: n/a History of harm to others?: No Assessment of Violence: None Noted Violent Behavior Description: none noted Does patient have access to weapons?: No Criminal Charges Pending?: No Does patient have a court date: No Is patient on probation?:  No  Psychosis Hallucinations: Auditory, Visual Delusions: None noted  Mental Status Report Appearance/Hygiene: In scrubs Eye Contact: Fair Motor Activity: Freedom of movement Speech: Logical/coherent Level of Consciousness: Crying, Alert Mood: Depressed Affect: Depressed Anxiety Level: Minimal Thought Processes: Circumstantial(Depressed) Judgement: Partial(Depressed) Orientation: Time, Place, Person, Situation Obsessive Compulsive Thoughts/Behaviors: None  Cognitive Functioning Concentration: Fair Memory: Recent Intact, Remote Intact Is patient IDD: No Is patient DD?: No Insight: Fair Impulse Control: Fair Appetite: Poor Have you had any weight changes? : Loss Amount of the weight change? (lbs): (unknown) Sleep: Decreased Total Hours of Sleep: (report has not slept in 2 days) Vegetative Symptoms: None  ADLScreening [place] [facility]) Patient's cognitive ability adequate to safely complete daily activities?: Yes Patient able to express need for assistance with ADLs?: Yes Independently performs ADLs?: Yes (appropriate for developmental age)  Prior Inpatient Therapy Prior Inpatient Therapy: Yes Prior Therapy Dates: unknown Prior Therapy Facilty/Provider(s): [facility], [facility] Reason for Treatment: mental health - Bi-polar  Prior Outpatient Therapy Prior Outpatient Therapy: No Does patient have an ACCT team?: No Does patient have Intensive In-House Services?  : No Does patient have [facility] services? : No Does patient have P4CC services?: No  ADL Screening (condition at time of admission) Patient's cognitive ability adequate to safely complete daily activities?: Yes Is  the patient deaf or have difficulty hearing?: No Does the patient have difficulty seeing, even when wearing glasses/contacts?: No Does the patient have difficulty concentrating, remembering, or making decisions?: No Patient able to express need for assistance with ADLs?: Yes Does the  patient have difficulty dressing or bathing?: No Independently performs ADLs?: Yes (appropriate for developmental age) Does the patient have difficulty walking or climbing stairs?: No       Abuse/Neglect Assessment (Assessment to be complete while patient is alone) Abuse/Neglect Assessment Can Be Completed: Yes Physical Abuse: Denies Verbal Abuse: Denies Sexual Abuse: Denies Exploitation of patient/patient's resources: Denies Self-Neglect: Denies     Regulatory affairs officer (For Healthcare) Does Patient Have a Medical Advance Directive?: No Would patient like information on creating a medical advance directive?: No - Patient declined    Additional Information 1:1 In Past 12 Months?: No CIRT Risk: No Elopement Risk: No Does patient have medical clearance?: Yes     Disposition:  Disposition Initial Assessment Completed for this Encounter: Yes  Asif Muchow 03/07/2018 12:20 PM

## 2018-03-07 NOTE — Care Management (Signed)
Writer referred patient to the following facilities: [facility], [facility], [facility], [place], [facility], [facility], [place], [facility], [place], [place], [place], [place], [facility] [place]

## 2018-03-07 NOTE — ED Provider Notes (Signed)
[facility] DEPARTMENT Provider Note   CSN: [id number] Arrival date & time: 03/07/18  0054     History   Chief Complaint Chief Complaint  Patient presents with  . Psychiatric Evaluation    HPI Erin [name] is a 52 y.o. female.  HPI   Patient is a 52 year old female with a history of anxiety, bipolar 1 disorder, hypothyroidism, alcohol induced liver disease, alcohol use disorder presenting for "need for psychiatric evaluation".  Patient reports that over the past 2 weeks she has been expressing increased anxiety.  Patient reports that she is hearing nonspecific voices that are telling her she is "messing up", and she should drink again.  Patient also reporting nonspecific visual hallucinations of "people" coming out of the door.  Patient reports she has not slept in 48 hours.  Patient reports she has been sober from alcohol for 1 month.  Patient reports multiple recent stressors with the loss of her stepfather last month, her mother in December, and her fianc.  Patient reports that she feels manic at this time, and she has had previous psychiatric hospitalizations for mania, last 1.5-2 years ago.  Patient denies suicidal ideations at this time.  Patient reports that she takes lactulose daily for her liver disease, and is compliant with this.  Patient denies any increased edema, reports that she has lost weight in the last 2 weeks.  Patient denies any visual changes, seizures, chest pain, shortness of breath.  Patient reports she has been nauseous with occasional vomiting.  No abdominal pain.  Patient reports she is not currently treated with mood stabilizing medications.  Patient with recent establishment of primary care.  On chart review, patient initially checked into [facility] yesterday evening, but presented here and not seen a [facility].   Past Medical History:  Diagnosis Date  . Anxiety   . Bipolar 1 disorder ([place])   . Blood transfusion July 2012  .  Bronchitis   . Cirrhosis of liver ([place])   . Depression   . History of sarcoidosis   . Hypothyroidism   . Liver failure ([place])   . Menorrhagia   . Wegener's granulomatosis [facility])     Patient Active Problem List   Diagnosis Date Noted  . Benzodiazepine abuse ([place]) 02/16/2017  . Substance induced mood disorder ([place]) 02/16/2017  . Alcohol use disorder, severe, dependence ([place]) 01/12/2016  . Cocaine dependence with cocaine-induced mood disorder ([place])   . Opioid dependence with withdrawal ([place])   . Bipolar disorder ([place]) 02/09/2015  . Alcohol dependence with alcohol-induced mood disorder ([place])   . Suicidal ideations   . Opioid dependence ([place]) 05/06/2014  . Cocaine dependence ([place]) 07/17/2013  . Thrombocytopenia ([place]) 01/06/2013  . Sarcoidosis 03/30/2010  . HYPERTENSION, BENIGN ESSENTIAL 01/11/2010  . HYPOTHYROIDISM 03/02/2009  . ALCOHOLIC LIVER DISEASE 62/95/2841  . CIRRHOSIS, ALCOHOLIC, LIVER 32/44/0102  . TOBACCO DEPENDENCE 02/22/2007  . EXTERNAL HEMORRHOIDS 01/11/2005  . PORTAL HYPERTENSION 01/11/2005    Past Surgical History:  Procedure Laterality Date  . CHOLECYSTECTOMY    . FRACTURE SURGERY    . HERNIA REPAIR    . LAPAROSCOPY    . LAPAROTOMY  07/28/2011   Procedure: EXPLORATORY LAPAROTOMY;  Surgeon: [name], MD;  Location: [facility];  Service: Gynecology;  Laterality: N/A;  . ORTHOPEDIC SURGERY    . TUBAL LIGATION    . VAGINAL HYSTERECTOMY  07/27/2011   Procedure: HYSTERECTOMY VAGINAL;  Surgeon: [name], MD;  Location: [facility];  Service: Gynecology;  Laterality:  N/A;  Vaginal Hysterectomy with Right Oophorectomy    OB History    Gravida Para Term Preterm AB Living   4 3 3  0 1 3   SAB TAB Ectopic Multiple Live Births                   Home Medications    Prior to Admission medications   Medication Sig Start Date End Date Taking? Authorizing Provider  acetaminophen-codeine (TYLENOL #3) 300-30 MG tablet Take 1 tablet by mouth every 6 (six) hours. 02/09/18    [provider]  amoxicillin-clavulanate (AUGMENTIN) 875-125 MG tablet Take 1 tablet by mouth every 12 (twelve) hours. 02/10/18   Vanessa Kick, MD  benzonatate (TESSALON) 100 MG capsule Take 1-2 capsules (100-200 mg total) by mouth 3 (three) times daily as needed for up to 60 doses for cough. 02/12/18   Scot Jun, FNP  budesonide-formoterol (SYMBICORT) 80-4.5 MCG/ACT inhaler Inhale 2 puffs into the lungs 2 (two) times daily. 01/12/18   Scot Jun, FNP  diclofenac (VOLTAREN) 75 MG EC tablet Take 1 tablet (75 mg total) by mouth 2 (two) times daily. 02/10/18   Vanessa Kick, MD  hydrochlorothiazide (HYDRODIURIL) 25 MG tablet Take 1 tablet (25 mg total) by mouth daily. For high blood pressure 01/12/18   Scot Jun, FNP  lactulose (CHRONULAC) 10 GM/15ML solution Take 30 mLs (20 g total) by mouth 3 (three) times daily. 02/02/18   Scot Jun, FNP  montelukast (SINGULAIR) 10 MG tablet Take 1 tablet (10 mg total) by mouth at bedtime. 01/12/18   Scot Jun, FNP  naproxen (NAPROSYN) 500 MG tablet Take 1 tablet (500 mg total) by mouth 2 (two) times daily with a meal. 02/12/18   Scot Jun, FNP  ondansetron (ZOFRAN) 4 MG tablet Take 1 tablet (4 mg total) by mouth every 8 (eight) hours as needed for nausea or vomiting. 02/21/18   Scot Jun, FNP  ondansetron (ZOFRAN-ODT) 4 MG disintegrating tablet Take 1 tablet (4 mg total) by mouth every 8 (eight) hours as needed for nausea or vomiting. 02/10/18   Vanessa Kick, MD  polyethylene glycol powder (GLYCOLAX/MIRALAX) powder Take 1 Container by mouth as needed. 02/06/18   [provider]  predniSONE (DELTASONE) 20 MG tablet Take 3 tablets (60 mg total) by mouth daily. Patient not taking: Reported on 01/26/2018 11/17/17   Lajean Saver, MD  thyroid (ARMOUR) 30 MG tablet Take 1 tablet (30 mg total) by mouth daily before breakfast. For low thyroid function 01/12/18   Scot Jun, FNP  traZODone (DESYREL)  150 MG tablet Take 1/2 tablet 75 mg twice daily for depression and for insomnia take 1-2 tablets 150-300 mg at night for sleep. 02/12/18   Scot Jun, FNP    Family History Family History  Problem Relation Age of Onset  . Diabetes Mother   . Hypertension Mother   . Diabetes Daughter   . Hypertension Daughter     Social History Social History   Tobacco Use  . Smoking status: Current Every Day Smoker    Packs/day: 0.25    Types: Cigarettes  . Smokeless tobacco: Never Used  Substance Use Topics  . Alcohol use: Yes    Comment: went 8 months without drinking but drank 2 today to try to feel better  . Drug use: Yes    Types: Marijuana     Allergies   Fluoxetine hcl; Metoclopramide hcl; and Morphine and related   Review of Systems Review  of Systems  Constitutional: Negative for chills and fever.  HENT: Negative for congestion and rhinorrhea.   Eyes: Negative for visual disturbance.  Respiratory: Negative for cough and shortness of breath.   Cardiovascular: Negative for chest pain.  Gastrointestinal: Positive for nausea and vomiting. Negative for abdominal pain.  Genitourinary: Negative for dysuria.  Musculoskeletal: Positive for back pain.  Neurological: Positive for headaches. Negative for seizures and weakness.  Psychiatric/Behavioral: Positive for sleep disturbance. Negative for suicidal ideas.     Physical Exam Updated Vital Signs BP 99/76 (BP Location: Right Arm)   Pulse 71   Temp 97.8 F (36.6 C) (Oral)   Resp 18   SpO2 97%   Physical Exam  Constitutional: She appears well-developed and well-nourished. No distress.  HENT:  Head: Normocephalic and atraumatic.  Mouth/Throat: Oropharynx is clear and moist.  Eyes: Conjunctivae and EOM are normal. Pupils are equal, round, and reactive to light.  Neck: Normal range of motion. Neck supple.  Cardiovascular: Normal rate, regular rhythm, S1 normal and S2 normal.  No murmur heard. Pulmonary/Chest: Effort  normal. She has wheezes. She has no rales.  Soft expiratory wheezes without prolonged expiratory phase and bilateral lung bases.  Abdominal: Soft. She exhibits no distension.  Musculoskeletal: Normal range of motion. She exhibits no edema or deformity.  Lymphadenopathy:    She has no cervical adenopathy.  Neurological: She is alert.  Cranial nerves grossly intact. Strength 5 out of 5 in upper and lower extremities.  Patient ambulates with an antalgic gait, which patient reports is due to the pain in her back at this time, with no evidence of foot drop, ataxia, or dysmetria.   Skin: Skin is warm and dry. No rash noted. No erythema.  Psychiatric:  Alert and oriented to person, place, time and situation.  Mildly pressured speech.  Patient speaks at a normal rate.  Thought content mostly linear, but patient does have perseverations about the loss of her family members.  Nursing note and vitals reviewed.    ED Treatments / Results  Labs (all labs ordered are listed, but only abnormal results are displayed) Labs Reviewed  CBC WITH DIFFERENTIAL/PLATELET - Abnormal; Notable for the following components:      Result Value   WBC 2.9 (*)    Platelets 88 (*)    All other components within normal limits  COMPREHENSIVE METABOLIC PANEL - Abnormal; Notable for the following components:   Chloride 99 (*)    BUN 24 (*)    Creatinine, Ser 1.41 (*)    AST 53 (*)    GFR calc non Af Amer 42 (*)    GFR calc Af Amer 48 (*)    All other components within normal limits  RAPID URINE DRUG SCREEN, HOSP PERFORMED - Abnormal; Notable for the following components:   Benzodiazepines POSITIVE (*)    Tetrahydrocannabinol POSITIVE (*)    All other components within normal limits  ETHANOL  AMMONIA  PROTIME-INR    EKG  EKG Interpretation None       Radiology US Abdomen Complete  Result Date: 03/05/2018 CLINICAL DATA:  Alcoholic cirrhosis EXAM: ABDOMEN ULTRASOUND COMPLETE COMPARISON:  CT abdomen and  pelvis January 04, 2013 FINDINGS: Gallbladder: Surgically absent. Common bile duct: Diameter: 5 mm. No intrahepatic, common hepatic, or common bile duct dilatation. Liver: No focal lesion identified. Liver contour is nodular. Liver echogenicity is coarse and overall increased. Portal vein is patent on color Doppler imaging with normal direction of blood flow towards the liver. IVC: No  abnormality visualized. Pancreas: Visualized portion unremarkable. Portions of pancreas obscured by gas. Spleen: Spleen measures 18.2 x 18.2 x 7.4 cm with a measures splenic volume of 1, 273 cubic cm. No focal splenic lesions are evident. Right Kidney: Length: 12.4 cm. Echogenicity within normal limits. No mass or hydronephrosis visualized. Left Kidney: Length: 13.5 cm. Echogenicity within normal limits. No mass or hydronephrosis visualized. Abdominal aorta: No aneurysm visualized. Other findings: No demonstrable ascites. IMPRESSION: 1. Liver as a nodular contour with overall increased echogenicity, findings indicative of hepatic cirrhosis. While no focal liver lesions are evident on this study, it must be cautioned that the sensitivity of ultrasound for detection of focal liver lesions is diminished significantly in this circumstance. 2.  Splenomegaly without focal splenic lesion evident. 3.  Gallbladder absent. 4. Portions of pancreas obscured by gas. Visualized portions of pancreas appear normal. Electronically Signed   By: Lowella Grip III M.D.   On: 03/05/2018 10:25    Procedures Procedures (including critical care time)  Medications Ordered in ED Medications - No data to display   Initial Impression / Assessment and Plan / ED Course  I have reviewed the triage vital signs and the nursing notes.  Pertinent labs & imaging results that were available during my care of the patient were reviewed by me and considered in my medical decision making (see chart for details).  Clinical Course as of Mar 08 1803  Wed Mar 07, 2018  1010 Ammonia and PT INR normal.  Therefore doubt acute liver dysfunction as cause of patient's increased anxiety and hallucinations.  Patient resting comfortably.  Patient feeling anxious.  Will order Atarax.  [AM]  1914 Blood pressure soft likely 2/2 dehydration. Will hold BP meds today.  [AM]  1316 I was called to the bedside to evaluate the patient.  Patient is reporting increased anxiety and the Atarax did not work.  Patient was positive for benzodiazepines and her urinalysis.  Based on vital signs, and clinical examination, do not feel the patient is withdrawing from benzodiazepines, however she may be benzodiazepine dependent at this time.  The recommendation is inpatient per TTS.  I agree with this recommendation.  Will order 1 mg of Ativan at this time given that patient may not be on a active benzodiazepine weaning program.  [AM]    Clinical Course User Index [AM] Albesa Seen, PA-C    Patient is nontoxic-appearing and in no acute distress.  Patient appears neurologically intact at this time. Given that patient has a history of psychiatric illness, do suspect that this is an acute decompensation as opposed to organic cause (7:43 AM).  Given history of elevated ammonia, and liver dysfunction, will assess ammonia PT/INR before clearing patient.  Additionally, as noted that patient has low platelets at 88 and WBC count of 2.9.  Patient is not on any medications that would cause a granulocytosis.  This is confirmed to be a chronic problem for patient.  I will have the patient follow-up with her primary care provider regarding further hematologic workup.  Additionally, blood pressure noted to be 99/66.  Patient ambulated with good coordination without dizziness or lightheadedness.  Will have patient hold antihypertensive today. Scheduled to restart tomorrow after rehydration.  Patient demonstrating AKI (see below for recent values). 1L NS provided.  Lab Results  Component Value Date    CREATININE 1.41 (H) 03/07/2018   CREATININE 0.77 01/12/2018   CREATININE 0.90 02/15/2017    Patient instructed to follow up in one week with  PCP for recheck of creatinine, BUN, and CBC.   Will place consult to TTS while awaiting lab results.  Disposition recommendation per Despina Hidden, Counselor is inpatient. Appreciate their consultation.  This is a supervised visit with Dr. Theotis Burrow. Evaluation, management, and discharge planning discussed with this attending physician.  Final Clinical Impressions(s) / ED Diagnoses   Final diagnoses:  AKI (acute kidney injury) ([place])  Leukopenia, unspecified type  Thrombocytopenia ([place])  Acute stress reaction    ED Discharge Orders    None       Tamala Julian 03/07/18 1807    Little, Wenda Overland, MD 03/09/18 2214

## 2018-03-07 NOTE — ED Notes (Signed)
Bed: WTR8 Expected date:  Expected time:  Means of arrival:  Comments: 

## 2018-03-08 ENCOUNTER — Other Ambulatory Visit: Payer: Self-pay | Admitting: Family Medicine

## 2018-03-08 DIAGNOSIS — F430 Acute stress reaction: Secondary | ICD-10-CM | POA: Diagnosis not present

## 2018-03-08 NOTE — ED Provider Notes (Signed)
Pt seen for essentially sounds like a manic episode and concern she would relapse. Inpt recommended but she is here voluntarily. She has been accepted but is requesting to leave. I spoke with her. She is calm and cooperative. Doesn't sem to be responding to internal stimuli. Denies SI or HI. Says she feels much better after sleeping. I do not find a basis to continue to keep her here.   Virgel Manifold, MD 03/08/18 [phone]

## 2018-03-08 NOTE — ED Notes (Signed)
Has been offered a bed at [facility] in [place].  [facility] to reach out to TTS staff and fax neccesary paper work for pt to fill out

## 2018-03-08 NOTE — ED Notes (Signed)
Pt refusing to fill out paperwork for accepting facility. Sts wants to go home and just needed a couple of days here so she would not turn to drinking or street drugs since she has been sober for so long.

## 2018-03-08 NOTE — ED Notes (Signed)
All of pts belongings returned and AVS reviewed with pt. Pt verbalized understanding and ambulatory out of department.

## 2018-03-08 NOTE — ED Notes (Signed)
Called [facility] to see if they need the paperwork now.  Desiree states that it can wait until pt wakes but needs to be faxed to [facility] in am and originals sent with pt.  [facility] can arrive at [facility] at [place].

## 2018-03-08 NOTE — ED Notes (Signed)
Pt belongings returned to her and she is showering at this time

## 2018-03-08 NOTE — ED Notes (Signed)
Dr. Wilson Singer to see pt at this time and plans to dc home.

## 2018-03-08 NOTE — BH Assessment (Signed)
[facility] has accepted the pt. Accepting Physician Bjorn Loser Report # [phone], ask for [address] Nurse Forms have been faxed to [facility] by [facility] Pt can come after 0900 03/08/18.

## 2018-03-09 NOTE — ED Notes (Signed)
03/09/2018, pt. Arrived at the NF desk and verbalized that she was here to pick up her medications in the Pharmacy.  I called the pharmacy and the did have her medications. [name] , the Patient Advocate assisted pt. To the pharmacy.

## 2018-03-23 ENCOUNTER — Other Ambulatory Visit: Payer: Self-pay | Admitting: Family Medicine

## 2018-03-27 ENCOUNTER — Ambulatory Visit: Payer: Self-pay

## 2018-04-19 DIAGNOSIS — J324 Chronic pansinusitis: Secondary | ICD-10-CM | POA: Insufficient documentation

## 2018-04-19 DIAGNOSIS — J3089 Other allergic rhinitis: Secondary | ICD-10-CM | POA: Insufficient documentation

## 2018-05-09 DIAGNOSIS — G5602 Carpal tunnel syndrome, left upper limb: Secondary | ICD-10-CM | POA: Insufficient documentation

## 2018-05-09 DIAGNOSIS — M180 Bilateral primary osteoarthritis of first carpometacarpal joints: Secondary | ICD-10-CM | POA: Insufficient documentation

## 2018-05-14 ENCOUNTER — Ambulatory Visit: Payer: Self-pay | Admitting: Family Medicine

## 2018-05-22 DIAGNOSIS — M5136 Other intervertebral disc degeneration, lumbar region: Secondary | ICD-10-CM | POA: Insufficient documentation

## 2018-05-24 DIAGNOSIS — M549 Dorsalgia, unspecified: Secondary | ICD-10-CM

## 2018-05-24 DIAGNOSIS — G8929 Other chronic pain: Secondary | ICD-10-CM | POA: Insufficient documentation

## 2018-05-30 DIAGNOSIS — K7460 Unspecified cirrhosis of liver: Secondary | ICD-10-CM | POA: Insufficient documentation

## 2018-05-30 DIAGNOSIS — J4520 Mild intermittent asthma, uncomplicated: Secondary | ICD-10-CM | POA: Insufficient documentation

## 2018-06-13 ENCOUNTER — Other Ambulatory Visit (HOSPITAL_BASED_OUTPATIENT_CLINIC_OR_DEPARTMENT_OTHER): Payer: Self-pay

## 2018-06-13 DIAGNOSIS — Z7189 Other specified counseling: Secondary | ICD-10-CM | POA: Insufficient documentation

## 2018-06-13 DIAGNOSIS — R5383 Other fatigue: Secondary | ICD-10-CM

## 2018-06-13 DIAGNOSIS — F54 Psychological and behavioral factors associated with disorders or diseases classified elsewhere: Secondary | ICD-10-CM

## 2018-06-13 DIAGNOSIS — G2581 Restless legs syndrome: Secondary | ICD-10-CM

## 2018-06-22 ENCOUNTER — Ambulatory Visit
Admission: RE | Admit: 2018-06-22 | Discharge: 2018-06-22 | Disposition: A | Discharge status: Home / Self Care | Payer: Medicare Other | Source: Ambulatory Visit | Attending: Family Medicine | Admitting: Family Medicine

## 2018-06-22 DIAGNOSIS — Z1239 Encounter for other screening for malignant neoplasm of breast: Secondary | ICD-10-CM

## 2018-06-27 DIAGNOSIS — M5136 Other intervertebral disc degeneration, lumbar region: Secondary | ICD-10-CM | POA: Diagnosis not present

## 2018-07-05 DIAGNOSIS — Z87891 Personal history of nicotine dependence: Secondary | ICD-10-CM | POA: Diagnosis not present

## 2018-07-05 DIAGNOSIS — Z136 Encounter for screening for cardiovascular disorders: Secondary | ICD-10-CM | POA: Diagnosis not present

## 2018-07-05 DIAGNOSIS — Z720 Tobacco use: Secondary | ICD-10-CM | POA: Diagnosis not present

## 2018-07-05 DIAGNOSIS — N289 Disorder of kidney and ureter, unspecified: Secondary | ICD-10-CM | POA: Diagnosis not present

## 2018-07-05 DIAGNOSIS — R7303 Prediabetes: Secondary | ICD-10-CM | POA: Diagnosis not present

## 2018-07-05 DIAGNOSIS — I10 Essential (primary) hypertension: Secondary | ICD-10-CM | POA: Diagnosis not present

## 2018-07-05 DIAGNOSIS — E039 Hypothyroidism, unspecified: Secondary | ICD-10-CM | POA: Diagnosis not present

## 2018-07-05 DIAGNOSIS — Z01818 Encounter for other preprocedural examination: Secondary | ICD-10-CM | POA: Diagnosis not present

## 2018-07-06 DIAGNOSIS — M5136 Other intervertebral disc degeneration, lumbar region: Secondary | ICD-10-CM | POA: Diagnosis not present

## 2018-07-08 ENCOUNTER — Ambulatory Visit (HOSPITAL_BASED_OUTPATIENT_CLINIC_OR_DEPARTMENT_OTHER): Payer: Medicare Other | Attending: Internal Medicine | Admitting: Internal Medicine

## 2018-07-08 VITALS — Ht 67.0 in | Wt 234.0 lb

## 2018-07-08 DIAGNOSIS — I10 Essential (primary) hypertension: Secondary | ICD-10-CM | POA: Diagnosis not present

## 2018-07-08 DIAGNOSIS — E669 Obesity, unspecified: Secondary | ICD-10-CM | POA: Diagnosis not present

## 2018-07-08 DIAGNOSIS — R5383 Other fatigue: Secondary | ICD-10-CM | POA: Insufficient documentation

## 2018-07-08 DIAGNOSIS — R0683 Snoring: Secondary | ICD-10-CM | POA: Diagnosis not present

## 2018-07-08 DIAGNOSIS — G2581 Restless legs syndrome: Secondary | ICD-10-CM

## 2018-07-10 DIAGNOSIS — E7800 Pure hypercholesterolemia, unspecified: Secondary | ICD-10-CM | POA: Insufficient documentation

## 2018-07-10 DIAGNOSIS — F909 Attention-deficit hyperactivity disorder, unspecified type: Secondary | ICD-10-CM | POA: Insufficient documentation

## 2018-07-10 DIAGNOSIS — M545 Low back pain: Secondary | ICD-10-CM | POA: Diagnosis not present

## 2018-07-10 DIAGNOSIS — I10 Essential (primary) hypertension: Secondary | ICD-10-CM | POA: Insufficient documentation

## 2018-07-10 DIAGNOSIS — M5136 Other intervertebral disc degeneration, lumbar region: Secondary | ICD-10-CM | POA: Diagnosis not present

## 2018-07-14 DIAGNOSIS — R5383 Other fatigue: Secondary | ICD-10-CM | POA: Diagnosis not present

## 2018-07-14 DIAGNOSIS — R0683 Snoring: Secondary | ICD-10-CM

## 2018-07-14 DIAGNOSIS — M25551 Pain in right hip: Secondary | ICD-10-CM | POA: Diagnosis not present

## 2018-07-14 DIAGNOSIS — M25552 Pain in left hip: Secondary | ICD-10-CM | POA: Diagnosis not present

## 2018-07-14 NOTE — Procedures (Signed)
   Patient Name: Erin [name], Erin [name] Date: 07/08/2018 Gender: Female D.O.B: Feb 17, 1966 Age (years): 52 Referring Provider: Doristine Section Bonsu Height (inches): 88 Interpreting Physician: Baird Lyons MD, ABSM Weight (lbs): 234 RPSGT: Jorge Ny BMI: 37 MRN: [id number] Neck Size: 14.50  CLINICAL INFORMATION Sleep Study Type: NPSG  Indication for sleep study: Fatigue, Hypertension, Obesity, Snoring  Epworth Sleepiness Score: 1  SLEEP STUDY TECHNIQUE As per the AASM Manual for the Scoring of Sleep and Associated Events v2.3 (April 2016) with a hypopnea requiring 4% desaturations.  The channels recorded and monitored were frontal, central and occipital EEG, electrooculogram (EOG), submentalis EMG (chin), nasal and oral airflow, thoracic and abdominal wall motion, anterior tibialis EMG, snore microphone, electrocardiogram, and pulse oximetry.  MEDICATIONS Medications self-administered by patient taken the night of the study : none reported  SLEEP ARCHITECTURE The study was initiated at 10:00:56 PM and ended at 5:14:55 AM.  Sleep onset time was 1.6 minutes and the sleep efficiency was 93.7%%. The total sleep time was 406.5 minutes.  Stage REM latency was 83.5 minutes.  The patient spent 3.6%% of the night in stage N1 sleep, 68.3%% in stage N2 sleep, 0.0%% in stage N3 and 28.17% in REM.  Alpha intrusion was absent.  Supine sleep was 34.06%.  RESPIRATORY PARAMETERS The overall apnea/hypopnea index (AHI) was 2.2 per hour. There were 1 total apneas, including 0 obstructive, 1 central and 0 mixed apneas. There were 14 hypopneas and 2 RERAs.  The AHI during Stage REM sleep was 6.3 per hour.  AHI while supine was 2.2 per hour.  The mean oxygen saturation was 95.2%. The minimum SpO2 during sleep was 89.0%.  soft snoring was noted during this study.  CARDIAC DATA The 2 lead EKG demonstrated sinus rhythm. The mean heart rate was 63.0 beats per minute. Other EKG findings  include: None.  LEG MOVEMENT DATA The total PLMS were 0 with a resulting PLMS index of 0.0. Associated arousal with leg movement index was 0.0 .  IMPRESSIONS - No significant obstructive sleep apnea occurred during this study (AHI = 2.2/h). - No significant central sleep apnea occurred during this study (CAI = 0.1/h). - The patient had minimal oxygen desaturation during the study (Min O2 = 89.0%, Mean 95.2%) - The patient snored with soft snoring volume. - No cardiac abnormalities were noted during this study. - Clinically significant periodic limb movements did not occur during sleep. No significant associated arousals.  DIAGNOSIS - Primary snoring  RECOMMENDATIONS - Be careful with alcohol, sedatives and other CNS depressants that may worsen sleep apnea and disrupt normal sleep architecture. - Sleep hygiene should be reviewed to assess factors that may improve sleep quality. - Weight management and regular exercise should be initiated or continued if appropriate.  [Electronically signed] 07/14/2018 03:04 PM  Baird Lyons MD, [place], American Board of Sleep Medicine   NPI: [id number]                          [place], [place] [facility] Sleep [facility]  ELECTRONICALLY SIGNED ON:  07/14/2018, 3:01 PM [place] PH: [phone]   FX: [phone] [place]

## 2018-07-17 DIAGNOSIS — J4530 Mild persistent asthma, uncomplicated: Secondary | ICD-10-CM | POA: Diagnosis not present

## 2018-07-17 DIAGNOSIS — E039 Hypothyroidism, unspecified: Secondary | ICD-10-CM | POA: Diagnosis not present

## 2018-07-17 DIAGNOSIS — R7303 Prediabetes: Secondary | ICD-10-CM | POA: Diagnosis not present

## 2018-07-17 DIAGNOSIS — I10 Essential (primary) hypertension: Secondary | ICD-10-CM | POA: Diagnosis not present

## 2018-07-17 DIAGNOSIS — M5136 Other intervertebral disc degeneration, lumbar region: Secondary | ICD-10-CM | POA: Diagnosis not present

## 2018-07-17 DIAGNOSIS — Z720 Tobacco use: Secondary | ICD-10-CM | POA: Diagnosis not present

## 2018-07-24 DIAGNOSIS — M5136 Other intervertebral disc degeneration, lumbar region: Secondary | ICD-10-CM | POA: Diagnosis not present

## 2018-07-25 DIAGNOSIS — Z720 Tobacco use: Secondary | ICD-10-CM | POA: Diagnosis not present

## 2018-07-25 DIAGNOSIS — E039 Hypothyroidism, unspecified: Secondary | ICD-10-CM | POA: Diagnosis not present

## 2018-07-25 DIAGNOSIS — J4530 Mild persistent asthma, uncomplicated: Secondary | ICD-10-CM | POA: Diagnosis not present

## 2018-07-25 DIAGNOSIS — J00 Acute nasopharyngitis [common cold]: Secondary | ICD-10-CM | POA: Diagnosis not present

## 2018-07-25 DIAGNOSIS — R7303 Prediabetes: Secondary | ICD-10-CM | POA: Diagnosis not present

## 2018-07-25 DIAGNOSIS — I10 Essential (primary) hypertension: Secondary | ICD-10-CM | POA: Diagnosis not present

## 2018-08-01 DIAGNOSIS — M5136 Other intervertebral disc degeneration, lumbar region: Secondary | ICD-10-CM | POA: Diagnosis not present

## 2018-08-03 DIAGNOSIS — I10 Essential (primary) hypertension: Secondary | ICD-10-CM | POA: Diagnosis not present

## 2018-08-06 ENCOUNTER — Encounter (HOSPITAL_COMMUNITY): Payer: Self-pay | Admitting: Emergency Medicine

## 2018-08-06 ENCOUNTER — Ambulatory Visit (HOSPITAL_COMMUNITY)
Admission: EM | Admit: 2018-08-06 | Discharge: 2018-08-06 | Disposition: A | Discharge status: Home / Self Care | Payer: Medicare Other

## 2018-08-06 ENCOUNTER — Other Ambulatory Visit: Payer: Self-pay

## 2018-08-06 DIAGNOSIS — R112 Nausea with vomiting, unspecified: Secondary | ICD-10-CM

## 2018-08-06 DIAGNOSIS — R42 Dizziness and giddiness: Secondary | ICD-10-CM | POA: Diagnosis not present

## 2018-08-06 NOTE — ED Triage Notes (Signed)
Pt reports vomiting since yesterday.  She states she threw up all day yesterday and a few times today.  Pt took Zofran around noon.  Pt states she feels weak, shaky, and dizzy.  Pt's eyes are watering, she states her mouth feels numb on her left side.

## 2018-08-06 NOTE — Discharge Instructions (Addendum)
Please go to the ER for further evaluation °

## 2018-08-06 NOTE — ED Provider Notes (Signed)
Erin [name]    CSN: [id number] Arrival date & time: 08/06/18  1350     History   Chief Complaint Chief Complaint  Patient presents with  . Emesis  . Dizziness    HPI Erin [name] is a 52 y.o. female.   Patient is a 52 year old female with past medical history of anxiety, bipolar, bronchitis, cirrhosis, depression, hypothyroidism, liver failure, alcohol abuse, opioid abuse, cocaine abuse, SI, portal hypertension, tobacco abuse.  She presents with vomiting that started at 5 PM yesterday and continued throughout the night into today.  She reports she has vomited at least 20 times.  Episode of diarrhea that was watery.  She cannot even keep down water.  She has been having headaches,dizziness and  neck pain but denies abdominal pain.  Earlier today she felt like her blood pressure was up so she laid down to take a nap and when she woke up she had numbness and tingling on the left side of her face.  She feels that her speech is slurred.  Reports a sensation of feeling weird she has been nauseous and sweaty at times.  She denies any recent alcohol or drug use and reports she has been sober for 5 months.   ROS per HPI      Past Medical History:  Diagnosis Date  . Anxiety   . Bipolar 1 disorder ([place])   . Blood transfusion July 2012  . Bronchitis   . Cirrhosis of liver ([place])   . Depression   . History of sarcoidosis   . Hypothyroidism   . Liver failure ([place])   . Menorrhagia   . Wegener's granulomatosis [facility])     Patient Active Problem List   Diagnosis Date Noted  . Benzodiazepine abuse ([place]) 02/16/2017  . Substance induced mood disorder ([place]) 02/16/2017  . Alcohol use disorder, severe, dependence ([place]) 01/12/2016  . Cocaine dependence with cocaine-induced mood disorder ([place])   . Opioid dependence with withdrawal ([place])   . Bipolar disorder ([place]) 02/09/2015  . Alcohol dependence with alcohol-induced mood disorder ([place])   . Suicidal ideations   . Opioid  dependence ([place]) 05/06/2014  . Cocaine dependence ([place]) 07/17/2013  . Thrombocytopenia ([place]) 01/06/2013  . Sarcoidosis 03/30/2010  . HYPERTENSION, BENIGN ESSENTIAL 01/11/2010  . HYPOTHYROIDISM 03/02/2009  . ALCOHOLIC LIVER DISEASE 94/49/6759  . CIRRHOSIS, ALCOHOLIC, LIVER 16/38/4665  . TOBACCO DEPENDENCE 02/22/2007  . EXTERNAL HEMORRHOIDS 01/11/2005  . PORTAL HYPERTENSION 01/11/2005    Past Surgical History:  Procedure Laterality Date  . CHOLECYSTECTOMY    . FRACTURE SURGERY    . HERNIA REPAIR    . LAPAROSCOPY    . LAPAROTOMY  07/28/2011   Procedure: EXPLORATORY LAPAROTOMY;  Surgeon: [name], MD;  Location: [facility];  Service: Gynecology;  Laterality: N/A;  . ORTHOPEDIC SURGERY    . TUBAL LIGATION    . VAGINAL HYSTERECTOMY  07/27/2011   Procedure: HYSTERECTOMY VAGINAL;  Surgeon: [name], MD;  Location: [facility];  Service: Gynecology;  Laterality: N/A;  Vaginal Hysterectomy with Right Oophorectomy    OB History    Gravida  4   Para  3   Term  3   Preterm  0   AB  1   Living  3     SAB      TAB      Ectopic      Multiple      Live Births  Home Medications    Prior to Admission medications   Medication Sig Start Date End Date Taking? Authorizing Provider  levothyroxine (SYNTHROID, LEVOTHROID) 50 MCG tablet Take 50 mcg by mouth daily before breakfast.   Yes [provider]  losartan (COZAAR) 25 MG tablet Take 25 mg by mouth daily.   Yes [provider]  ondansetron (ZOFRAN-ODT) 4 MG disintegrating tablet Take 1 tablet (4 mg total) by mouth every 8 (eight) hours as needed for nausea or vomiting. 02/10/18  Yes Vanessa Kick, MD  amoxicillin-clavulanate (AUGMENTIN) 875-125 MG tablet Take 1 tablet by mouth every 12 (twelve) hours. Patient not taking: Reported on 03/07/2018 02/10/18   Vanessa Kick, MD  Ascorbic Acid (VITAMIN C) POWD Take 1,000 mg by mouth daily.    [provider]  benzonatate (TESSALON) 100 MG  capsule Take 1-2 capsules (100-200 mg total) by mouth 3 (three) times daily as needed for up to 60 doses for cough. Patient not taking: Reported on 03/07/2018 02/12/18   Scot Jun, FNP  budesonide-formoterol [facility]) 160-4.5 MCG/ACT inhaler Inhale 2 puffs into the lungs 2 (two) times daily.    [provider]  budesonide-formoterol (SYMBICORT) 80-4.5 MCG/ACT inhaler Inhale 2 puffs into the lungs 2 (two) times daily. Patient not taking: Reported on 03/07/2018 01/12/18   Scot Jun, FNP  diclofenac (VOLTAREN) 75 MG EC tablet Take 1 tablet (75 mg total) by mouth 2 (two) times daily. Patient not taking: Reported on 03/07/2018 02/10/18   Vanessa Kick, MD  hydrochlorothiazide (HYDRODIURIL) 25 MG tablet Take 1 tablet (25 mg total) by mouth daily. For high blood pressure 01/12/18   Scot Jun, FNP  lactulose (CHRONULAC) 10 GM/15ML solution Take 30 mLs (20 g total) by mouth 3 (three) times daily. 02/02/18   Scot Jun, FNP  lactulose, encephalopathy, (CHRONULAC) 10 GM/15ML SOLN Take 20 g by mouth 3 (three) times daily.    [provider]  montelukast (SINGULAIR) 10 MG tablet Take 1 tablet (10 mg total) by mouth at bedtime. 01/12/18   Scot Jun, FNP  ondansetron (ZOFRAN) 4 MG tablet Take 1 tablet (4 mg total) by mouth every 8 (eight) hours as needed for nausea or vomiting. 03/13/18   Scot Jun, FNP  polyethylene glycol powder (GLYCOLAX/MIRALAX) powder Take 1 Container by mouth as needed. 02/06/18   [provider]  thyroid (ARMOUR) 30 MG tablet Take 1 tablet (30 mg total) by mouth daily before breakfast. For low thyroid function Patient not taking: Reported on 03/07/2018 01/12/18   Scot Jun, FNP  traZODone (DESYREL) 150 MG tablet Take 1/2 tablet 75 mg twice daily for depression and for insomnia take 1-2 tablets 150-300 mg at night for sleep. Patient not taking: Reported on 03/07/2018 02/12/18   Scot Jun, FNP    Family  History Family History  Problem Relation Age of Onset  . Diabetes Mother   . Hypertension Mother   . Diabetes Daughter   . Hypertension Daughter     Social History Social History   Tobacco Use  . Smoking status: Current Every Day Smoker    Packs/day: 0.25    Types: Cigarettes  . Smokeless tobacco: Never Used  Substance Use Topics  . Alcohol use: Yes    Comment: went 8 months without drinking but drank 2 today to try to feel better  . Drug use: Yes    Types: Marijuana     Allergies   Fluoxetine hcl; Metoclopramide hcl; and Morphine and related  Review of Systems Review of Systems   Physical Exam Triage Vital Signs ED Triage Vitals  Enc Vitals Group     BP 08/06/18 1406 125/83     Pulse Rate 08/06/18 1406 73     Resp --      Temp 08/06/18 1406 97.6 F (36.4 C)     Temp Source 08/06/18 1406 Oral     SpO2 08/06/18 1406 100 %     Weight --      Height --      Head Circumference --      Peak Flow --      Pain Score 08/06/18 1407 0     Pain Loc --      Pain Edu? --      Excl. in [place]? --    No data found.  Updated Vital Signs BP 125/83 (BP Location: Left Arm)   Pulse 73   Temp 97.6 F (36.4 C) (Oral)   SpO2 100%   Visual Acuity Right Eye Distance:   Left Eye Distance:   Bilateral Distance:    Right Eye Near:   Left Eye Near:    Bilateral Near:     Physical Exam  Constitutional: She appears well-developed and well-nourished.  HENT:  Head: Normocephalic and atraumatic.  Right Ear: External ear normal.  Left Ear: External ear normal.  Nose: Nose normal.  Eyes: Pupils are equal, round, and reactive to light. Conjunctivae and EOM are normal.  Neck: Normal range of motion.  Cardiovascular: Normal rate, regular rhythm and normal heart sounds.  Pulmonary/Chest: Effort normal and breath sounds normal.  Abdominal: Soft. Bowel sounds are normal. She exhibits no distension and no mass. There is no tenderness. There is no rebound and no guarding. No  hernia.  Neurological: She is alert. She has normal strength. She displays tremor. A sensory deficit is present. No cranial nerve deficit.  No facial droop or arm drift. Cranial nerves intact. Decreased sensation to left side of face. Speech slurred. Strength 5/5.   Skin: Skin is warm.  Psychiatric: Her mood appears anxious. Her speech is slurred. She is slowed.  Nursing note and vitals reviewed.    UC Treatments / Results  Labs (all labs ordered are listed, but only abnormal results are displayed) Labs Reviewed - No data to display  EKG None  Radiology No results found.  Procedures Procedures (including critical care time)  Medications Ordered in UC Medications - No data to display  Initial Impression / Assessment and Plan / UC Course  I have reviewed the triage vital signs and the nursing notes.  Pertinent labs & imaging results that were available during my care of the patient were reviewed by me and considered in my medical decision making (see chart for details).     Really unsure of when the numbness or speech difficulty started. Pt is a poor historian. Her symptoms are worrisome and sending her to the ER for further evaluation. She may have a virus and be dehydrated. She does have anxiety and appears anxious but the numbness in her face, speech and dizziness with headache needs to be further evaluated. Vital signs stable. Pt agreeable to plan.     Final Clinical Impressions(s) / UC Diagnoses   Final diagnoses:  None   Discharge Instructions   None    ED Prescriptions    None     Controlled Substance Prescriptions [place] Controlled Substance Registry consulted? Not Applicable   Orvan July, NP 08/06/18 1539

## 2018-08-14 DIAGNOSIS — M25552 Pain in left hip: Secondary | ICD-10-CM | POA: Diagnosis not present

## 2018-08-14 DIAGNOSIS — M25551 Pain in right hip: Secondary | ICD-10-CM | POA: Diagnosis not present

## 2018-08-16 DIAGNOSIS — I10 Essential (primary) hypertension: Secondary | ICD-10-CM | POA: Diagnosis not present

## 2018-08-16 DIAGNOSIS — Z720 Tobacco use: Secondary | ICD-10-CM | POA: Diagnosis not present

## 2018-08-16 DIAGNOSIS — N289 Disorder of kidney and ureter, unspecified: Secondary | ICD-10-CM | POA: Diagnosis not present

## 2018-08-16 DIAGNOSIS — R7303 Prediabetes: Secondary | ICD-10-CM | POA: Diagnosis not present

## 2018-08-16 DIAGNOSIS — E039 Hypothyroidism, unspecified: Secondary | ICD-10-CM | POA: Diagnosis not present

## 2018-08-23 DIAGNOSIS — E039 Hypothyroidism, unspecified: Secondary | ICD-10-CM | POA: Diagnosis not present

## 2018-08-23 DIAGNOSIS — Z720 Tobacco use: Secondary | ICD-10-CM | POA: Diagnosis not present

## 2018-08-23 DIAGNOSIS — I10 Essential (primary) hypertension: Secondary | ICD-10-CM | POA: Diagnosis not present

## 2018-08-23 DIAGNOSIS — J4530 Mild persistent asthma, uncomplicated: Secondary | ICD-10-CM | POA: Diagnosis not present

## 2018-08-23 DIAGNOSIS — R7303 Prediabetes: Secondary | ICD-10-CM | POA: Diagnosis not present

## 2018-09-11 ENCOUNTER — Inpatient Hospital Stay: Payer: Medicare Other | Attending: Oncology | Admitting: Oncology

## 2018-09-11 ENCOUNTER — Telehealth: Payer: Self-pay | Admitting: Internal Medicine

## 2018-09-11 ENCOUNTER — Inpatient Hospital Stay: Payer: Medicare Other

## 2018-09-11 VITALS — BP 119/89 | HR 102 | Temp 97.4°F | Resp 16 | Ht 67.0 in | Wt 229.5 lb

## 2018-09-11 DIAGNOSIS — M3130 Wegener's granulomatosis without renal involvement: Secondary | ICD-10-CM | POA: Diagnosis not present

## 2018-09-11 DIAGNOSIS — F1911 Other psychoactive substance abuse, in remission: Secondary | ICD-10-CM | POA: Insufficient documentation

## 2018-09-11 DIAGNOSIS — I10 Essential (primary) hypertension: Secondary | ICD-10-CM | POA: Insufficient documentation

## 2018-09-11 DIAGNOSIS — D696 Thrombocytopenia, unspecified: Secondary | ICD-10-CM | POA: Insufficient documentation

## 2018-09-11 DIAGNOSIS — Z720 Tobacco use: Secondary | ICD-10-CM | POA: Insufficient documentation

## 2018-09-11 DIAGNOSIS — F909 Attention-deficit hyperactivity disorder, unspecified type: Secondary | ICD-10-CM | POA: Diagnosis not present

## 2018-09-11 DIAGNOSIS — K7030 Alcoholic cirrhosis of liver without ascites: Secondary | ICD-10-CM | POA: Diagnosis not present

## 2018-09-11 DIAGNOSIS — F1411 Cocaine abuse, in remission: Secondary | ICD-10-CM | POA: Insufficient documentation

## 2018-09-11 DIAGNOSIS — E039 Hypothyroidism, unspecified: Secondary | ICD-10-CM | POA: Insufficient documentation

## 2018-09-11 DIAGNOSIS — F1211 Cannabis abuse, in remission: Secondary | ICD-10-CM | POA: Insufficient documentation

## 2018-09-11 DIAGNOSIS — D869 Sarcoidosis, unspecified: Secondary | ICD-10-CM | POA: Insufficient documentation

## 2018-09-11 DIAGNOSIS — D72819 Decreased white blood cell count, unspecified: Secondary | ICD-10-CM

## 2018-09-11 DIAGNOSIS — F1020 Alcohol dependence, uncomplicated: Secondary | ICD-10-CM

## 2018-09-11 DIAGNOSIS — F1011 Alcohol abuse, in remission: Secondary | ICD-10-CM

## 2018-09-11 DIAGNOSIS — Z809 Family history of malignant neoplasm, unspecified: Secondary | ICD-10-CM | POA: Diagnosis not present

## 2018-09-11 DIAGNOSIS — K709 Alcoholic liver disease, unspecified: Secondary | ICD-10-CM

## 2018-09-11 LAB — CBC WITH DIFFERENTIAL (CANCER CENTER ONLY)
BASOS ABS: 0 10*3/uL (ref 0.0–0.1)
BASOS PCT: 0 %
EOS PCT: 2 %
Eosinophils Absolute: 0.1 10*3/uL (ref 0.0–0.5)
HEMATOCRIT: 38.4 % (ref 34.8–46.6)
Hemoglobin: 13.2 g/dL (ref 11.6–15.9)
Lymphocytes Relative: 24 %
Lymphs Abs: 0.7 10*3/uL — ABNORMAL LOW (ref 0.9–3.3)
MCH: 31.9 pg (ref 25.1–34.0)
MCHC: 34.4 g/dL (ref 31.5–36.0)
MCV: 92.8 fL (ref 79.5–101.0)
MONOS PCT: 9 %
Monocytes Absolute: 0.3 10*3/uL (ref 0.1–0.9)
Neutro Abs: 2 10*3/uL (ref 1.5–6.5)
Neutrophils Relative %: 65 %
PLATELETS: 67 10*3/uL — AB (ref 145–400)
RBC: 4.14 MIL/uL (ref 3.70–5.45)
RDW: 15.8 % — ABNORMAL HIGH (ref 11.2–14.5)
WBC: 3 10*3/uL — AB (ref 3.9–10.3)

## 2018-09-11 LAB — VITAMIN B12: Vitamin B-12: 495 pg/mL (ref 180–914)

## 2018-09-11 LAB — FOLATE: Folate: 24.3 ng/mL (ref >5.9)

## 2018-09-11 NOTE — Telephone Encounter (Signed)
appts scheduled avs calendar printed per 9/17 los °

## 2018-09-11 NOTE — Progress Notes (Signed)
[facility] Telephone:([phone]   Fax:([phone]) [phone]            CONSULT NOTE  REFERRING PHYSICIAN:  Dr. Iona Beard Osei-Bonsu  REASON FOR CONSULTATION: Leukopenia and thrombocytopenia  HPI: Erin [name] is a 52 y.o. female with a past medical history including hypertension, hypothyroidism, obesity, alcoholic cirrhosis, sarcoidosis, ADHD.  The patient is seen at the request of Dr. Vista Lawman for leukopenia and thrombocytopenia.  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  Most recently she had labs performed at her primary care provider's office which showed a total white blood cell count of 2.0, and ANC of 1.0, and a platelet count of 72,000. She reports that she has fatigue.  She denies fevers and chills.  She does report recurrent sinus infections.  She estimates having 5-6 sinus infections over the past year.  She reports a cough.  She also reports nausea without vomiting.  The patient denies epistaxis, bleeding gums, hemoptysis, hematuria, melena.  The patient denies chest pain and shortness of breath.  Denies constipation diarrhea.  She has lost weight but reports that she has been actively trying to do so.  Denies night sweats. The patient has a family history of a brother who died in his 50s of cancer (patient does not know the primary), a father who died of cancer but she does not know how old he was.  She does not know the type of cancer that he had.  The patient's mother died in her 54s due to unknown cause. The patient is single and has 3 adult children.  She is not currently working.  The patient smoked approximately 1/5 of a pack of cigarettes daily up until 6 months ago.  The patient has a history of polysubstance abuse including alcohol, marijuana, cocaine, and benzodiazepines.  She states that she quit all this approximately 4 months ago.  She cannot quantify how much she drank and for how long she drank.   Past  Medical History:  Diagnosis Date  . Anxiety   . Bipolar 1 disorder ([place])   . Blood transfusion July 2012  . Bronchitis   . Cirrhosis of liver ([place])   . Depression   . History of sarcoidosis   . Hypothyroidism   . Liver failure ([place])   . Menorrhagia   . Wegener's granulomatosis ([place])   :    Past Surgical History:  Procedure Laterality Date  . CHOLECYSTECTOMY    . FRACTURE SURGERY    . HERNIA REPAIR    . LAPAROSCOPY    . LAPAROTOMY  07/28/2011   Procedure: EXPLORATORY LAPAROTOMY;  Surgeon: [name], MD;  Location: [facility];  Service: Gynecology;  Laterality: N/A;  . ORTHOPEDIC SURGERY    . TUBAL LIGATION    . VAGINAL HYSTERECTOMY  07/27/2011   Procedure: HYSTERECTOMY VAGINAL;  Surgeon: [name], MD;  Location: [facility];  Service: Gynecology;  Laterality: N/A;  Vaginal Hysterectomy with Right Oophorectomy  :   CURRENT MEDS: Current Outpatient Medications  Medication Sig Dispense Refill  . Ascorbic Acid (VITAMIN C) POWD Take 1,000 mg by mouth daily.    . benzonatate (TESSALON) 100 MG capsule Take 1-2 capsules (100-200 mg total) by mouth 3 (three) times daily as needed for up to 60 doses for cough. (Patient not taking: Reported on 03/07/2018) 20 capsule 0  . budesonide-formoterol (SYMBICORT) 160-4.5 MCG/ACT inhaler Inhale 2 puffs into the lungs 2 (two) times daily.    Marland Kitchen  budesonide-formoterol (SYMBICORT) 80-4.5 MCG/ACT inhaler Inhale 2 puffs into the lungs 2 (two) times daily. (Patient not taking: Reported on 03/07/2018) 1 Inhaler 3  . diclofenac (VOLTAREN) 75 MG EC tablet Take 1 tablet (75 mg total) by mouth 2 (two) times daily. (Patient not taking: Reported on 03/07/2018) 14 tablet 0  . hydrochlorothiazide (HYDRODIURIL) 25 MG tablet Take 1 tablet (25 mg total) by mouth daily. For high blood pressure 90 tablet 1  . lactulose (CHRONULAC) 10 GM/15ML solution Take 30 mLs (20 g total) by mouth 3 (three) times daily. 473 mL 1  . lactulose, encephalopathy, (CHRONULAC) 10 GM/15ML SOLN  Take 20 g by mouth 3 (three) times daily.    Marland Kitchen levothyroxine (SYNTHROID, LEVOTHROID) 50 MCG tablet Take 50 mcg by mouth daily before breakfast.    . losartan (COZAAR) 25 MG tablet Take 25 mg by mouth daily.    . montelukast (SINGULAIR) 10 MG tablet Take 1 tablet (10 mg total) by mouth at bedtime. 30 tablet 3  . ondansetron (ZOFRAN) 4 MG tablet Take 1 tablet (4 mg total) by mouth every 8 (eight) hours as needed for nausea or vomiting. 30 tablet 1  . ondansetron (ZOFRAN-ODT) 4 MG disintegrating tablet Take 1 tablet (4 mg total) by mouth every 8 (eight) hours as needed for nausea or vomiting. 15 tablet 0  . polyethylene glycol powder (GLYCOLAX/MIRALAX) powder Take 1 Container by mouth as needed.  5  . thyroid (ARMOUR) 30 MG tablet Take 1 tablet (30 mg total) by mouth daily before breakfast. For low thyroid function (Patient not taking: Reported on 03/07/2018) 90 tablet 1   No current facility-administered medications for this visit.      Allergies  Allergen Reactions  . Fluoxetine Hcl Other (See Comments)     hyperactivity  . Metoclopramide Hcl Other (See Comments)    depression  . Morphine And Related Other (See Comments)    High doses cause hallucinations  :   Family History  Problem Relation Age of Onset  . Diabetes Mother   . Hypertension Mother   . Diabetes Daughter   . Hypertension Daughter   :   Social History   Socioeconomic History  . Marital status: Divorced    Spouse name: Not on file  . Number of children: Not on file  . Years of education: Not on file  . Highest education level: Not on file  Occupational History  . Not on file  Social Needs  . Financial resource strain: Not on file  . Food insecurity:    Worry: Not on file    Inability: Not on file  . Transportation needs:    Medical: Not on file    Non-medical: Not on file  Tobacco Use  . Smoking status: Current Every Day Smoker    Packs/day: 0.25    Types: Cigarettes  . Smokeless tobacco: Never Used    Substance and Sexual Activity  . Alcohol use: Yes    Comment: went 8 months without drinking but drank 2 today to try to feel better  . Drug use: Yes    Types: Marijuana  . Sexual activity: Never  Lifestyle  . Physical activity:    Days per week: Not on file    Minutes per session: Not on file  . Stress: Not on file  Relationships  . Social connections:    Talks on phone: Not on file    Gets together: Not on file    Attends religious service: Not on file  Active member of club or organization: Not on file    Attends meetings of clubs or organizations: Not on file    Relationship status: Not on file  . Intimate partner violence:    Fear of current or ex partner: Not on file    Emotionally abused: Not on file    Physically abused: Not on file    Forced sexual activity: Not on file  Other Topics Concern  . Not on file  Social History Narrative  . Not on file  :  REVIEW OF SYSTEMS:   Constitutional: Negative for appetite change, chills, fever.  Positive for fatigue. HENT:   Negative for mouth sores, nosebleeds, sore throat and trouble swallowing.   Eyes: Negative for eye problems and icterus.  Respiratory: Negative for hemoptysis, shortness of breath and wheezing.  Positive for cough.  Cardiovascular: Negative for chest pain and leg swelling.  Gastrointestinal: Negative for abdominal pain, constipation, diarrhea,and vomiting.  Positive for intermittent nausea. Genitourinary: Negative for bladder incontinence, difficulty urinating, dysuria, frequency and hematuria.   Musculoskeletal: Negative for back pain, gait problem, neck pain and neck stiffness.  Skin: Negative for itching and rash.  Neurological: Negative for dizziness, extremity weakness, gait problem, headaches, light-headedness and seizures.  Hematological: Negative for adenopathy. Does not bruise/bleed easily.  Psychiatric/Behavioral: Negative for confusion, depression and sleep disturbance. The patient is not  nervous/anxious.     PHYSICAL EXAMINATION: Blood pressure 119/89, pulse (!) 102, temperature (!) 97.4 F (36.3 C), temperature source Oral, resp. rate 16, height 5' 7" (1.702 m), weight 229 lb 8 oz (104.1 kg), SpO2 98 %.  ECOG PERFORMANCE STATUS: 1 - Symptomatic but completely ambulatory  Physical Exam  Constitutional: Oriented to person, place, and time and well-developed, well-nourished, and in no distress. No distress.  HENT:  Head: Normocephalic and atraumatic.  Mouth/Throat: Oropharynx is clear and moist. No oropharyngeal exudate.  Eyes: Conjunctivae are normal. Right eye exhibits no discharge. Left eye exhibits no discharge. No scleral icterus.  Neck: Normal range of motion. Neck supple.  Cardiovascular: Normal rate, regular rhythm, normal heart sounds and intact distal pulses.   Pulmonary/Chest: Effort normal and breath sounds normal. No respiratory distress. No wheezes. No rales.  Abdominal: Soft. Bowel sounds are normal. Exhibits no distension and no mass. There is no tenderness.  Liver edge is palpable approximately 1 cm below the right costal margin.  Spleen nonpalpable. Musculoskeletal: Normal range of motion. Exhibits no edema.  Lymphadenopathy:    No cervical adenopathy.  Neurological: Alert and oriented to person, place, and time. Exhibits normal muscle tone. Gait normal. Coordination normal.  Skin: Skin is warm and dry. No rash noted. Not diaphoretic. No erythema. No pallor.  Psychiatric: Mood, memory and judgment normal.  Vitals reviewed.    LABS:  Lab Results  Component Value Date   WBC 3.0 (L) 09/11/2018   HGB 13.2 09/11/2018   HCT 38.4 09/11/2018   PLT 67 (L) 09/11/2018   GLUCOSE 92 03/07/2018   CHOL 210 (H) 01/12/2018   TRIG 97 01/12/2018   HDL 77 01/12/2018   LDLCALC 114 (H) 01/12/2018   ALT 34 03/07/2018   AST 53 (H) 03/07/2018   NA 137 03/07/2018   K 3.9 03/07/2018   CL 99 (L) 03/07/2018   CREATININE 1.41 (H) 03/07/2018   BUN 24 (H) 03/07/2018    CO2 28 03/07/2018   INR 1.01 03/07/2018   HGBA1C 4.8 01/12/2018    No results found.  ASSESSMENT: This is a pleasant 52 year old Caucasian female  with leukopenia and thrombocytopenia.  She has alcoholic cirrhosis as well as splenomegaly noted on an abdominal ultrasound performed earlier this year.  Leukopenia and thrombus cytopenia is likely related to her alcoholic cirrhosis but other causes need to be ruled out.  PLAN: The patient was seen with Dr. Julien Nordmann.  Labs from her primary care provider in previous hospital records have been reviewed.  We also reviewed the abdominal ultrasound performed in March 2019.  The patient has a long history of leukopenia and thrombocytopenia dating back over 10 years.  She has cirrhosis and splenomegaly noted on her recent abdominal ultrasound.  We discussed with the patient that her low counts may be related to her alcoholic cirrhosis but additional work-up is needed.  We will repeat a CBC and also obtain a ferritin, iron studies, vitamin H84 level, folic acid level, ANA, rheumatoid factor, HIV.  If her counts do not correct, we will consider her for a bone marrow biopsy.  We will contact her with her lab results.  The patient will follow-up with [place] in approximately 4 weeks for repeat labs and discussion of her bone marrow biopsy results if we proceed with one.  She was advised to call immediately if she has any concerning symptoms in the interval. The patient voices understanding of current disease status and treatment options and is in agreement with the current care plan.   All questions were answered. The patient knows to call the clinic with any problems, questions or concerns. We can certainly see the patient much sooner if necessary.   Thank you so much for allowing me to participate in the care of EEVA SCHLOSSER. I will continue to follow up the patient with you and assist in her care.  Orders Placed This Encounter  Procedures  . CBC with  Differential (Cancer Center Only)    Standing Status:   Future    Number of Occurrences:   1    Standing Expiration Date:   09/12/2019  . Ferritin    Standing Status:   Future    Number of Occurrences:   1    Standing Expiration Date:   09/12/2019  . Iron and TIBC    Standing Status:   Future    Number of Occurrences:   1    Standing Expiration Date:   09/12/2019  . Vitamin B12    Standing Status:   Future    Number of Occurrences:   1    Standing Expiration Date:   09/11/2019  . Folate, Serum    Standing Status:   Future    Number of Occurrences:   1    Standing Expiration Date:   09/11/2019  . ANA, IFA (with reflex)    Standing Status:   Future    Number of Occurrences:   1    Standing Expiration Date:   09/11/2019  . HIV antibody (with reflex)    Standing Status:   Future    Number of Occurrences:   1    Standing Expiration Date:   09/11/2019  . Rheumatoid factor    Standing Status:   Future    Number of Occurrences:   1    Standing Expiration Date:   09/11/2019   Mikey Bussing, DNP, AGPCNP-BC, AOCNP  ADDENDUM: Hematology/Oncology Attending: I had a face-to-face encounter with the patient.  I recommended her care plan.  This is a very pleasant 52 years old white female with multiple medical problems including history of liver cirrhosis,  polysubstance abuse, sarcoidosis, hypothyroidism as well as Wegener's granulomatosis. The patient presented for evaluation of persistent leukocytopenia and thrombocytopenia.  These are likely secondary to her liver cirrhosis and alcohol abuse. I will order several studies today for evaluation of her condition and to rule out any other etiologies. I will order repeat CBC, iron study, ferritin, serum folate, vitamin B12, HIV, ANA as well as rheumatoid factor. If there is no clear etiology for her anemia from these blood work, we will consider The patient for a bone marrow biopsy and aspirate to rule out any underlying myelodysplastic or  myeloproliferative disorder. We will arrange for the patient to come back for follow-up visit in 4 weeks for evaluation and discussion of her lab results and further recommendation regarding her condition. The patient is currently asymptomatic and she will be monitored closely by observation for now. She was advised to call immediately if she has any concerning symptoms in the interval.  Disclaimer: This note was dictated with voice recognition software. Similar sounding words can inadvertently be transcribed and may be missed upon review. Eilleen Kempf, MD 09/12/18

## 2018-09-12 ENCOUNTER — Encounter: Payer: Self-pay | Admitting: Oncology

## 2018-09-12 LAB — IRON AND TIBC
IRON: 88 ug/dL (ref 41–142)
Saturation Ratios: 25 % (ref 21–57)
TIBC: 359 ug/dL (ref 236–444)
UIBC: 271 ug/dL

## 2018-09-12 LAB — HIV ANTIBODY (ROUTINE TESTING W REFLEX): HIV SCREEN 4TH GENERATION: NONREACTIVE

## 2018-09-12 LAB — FERRITIN: Ferritin: 146 ng/mL (ref 11–307)

## 2018-09-12 LAB — FANA STAINING PATTERNS: Speckled Pattern: 1:80 {titer}

## 2018-09-12 LAB — RHEUMATOID FACTOR: Rhuematoid fact SerPl-aCnc: 12.2 IU/mL (ref 0.0–13.9)

## 2018-09-12 LAB — ANTINUCLEAR ANTIBODIES, IFA: ANTINUCLEAR ANTIBODIES, IFA: POSITIVE — AB

## 2018-09-14 ENCOUNTER — Telehealth: Payer: Self-pay | Admitting: Oncology

## 2018-09-14 ENCOUNTER — Telehealth: Payer: Self-pay

## 2018-09-14 DIAGNOSIS — M25552 Pain in left hip: Secondary | ICD-10-CM | POA: Diagnosis not present

## 2018-09-14 DIAGNOSIS — M25551 Pain in right hip: Secondary | ICD-10-CM | POA: Diagnosis not present

## 2018-09-14 NOTE — Telephone Encounter (Signed)
Spoke with patient to inform of BMBX scheduled for 09/21/18 at 7:30 am.  Patient voiced understanding.  No questions at this time.

## 2018-09-14 NOTE — Telephone Encounter (Signed)
I spoke with the patient this morning regarding her lab results.  I notified her that we plan to proceed with a bone marrow biopsy.  She will receive a telephone call with the date and time of this procedure.  I have forwarded a message to Wilber Bihari, NP to schedule this patient.

## 2018-09-14 NOTE — Telephone Encounter (Signed)
-----  Message from Maryanna Shape, NP sent at 09/14/2018  9:51 AM EDT ----- Regarding: Bone marrow biopsy Can you please set this pt up for a bone marrow biopsy? I talked with her this am and made her aware that we wanted to proceed. You can reach her at her cell number.   Dx is leukopenia and thrombocytopenia.  Thanks

## 2018-09-21 ENCOUNTER — Telehealth: Payer: Self-pay | Admitting: Adult Health

## 2018-09-21 ENCOUNTER — Inpatient Hospital Stay: Payer: Medicare Other

## 2018-09-21 ENCOUNTER — Inpatient Hospital Stay (HOSPITAL_BASED_OUTPATIENT_CLINIC_OR_DEPARTMENT_OTHER): Payer: Medicare Other | Admitting: Adult Health

## 2018-09-21 VITALS — BP 107/72 | HR 79 | Temp 97.6°F | Resp 16

## 2018-09-21 DIAGNOSIS — D72819 Decreased white blood cell count, unspecified: Secondary | ICD-10-CM | POA: Diagnosis not present

## 2018-09-21 DIAGNOSIS — D696 Thrombocytopenia, unspecified: Secondary | ICD-10-CM | POA: Diagnosis not present

## 2018-09-21 DIAGNOSIS — Z720 Tobacco use: Secondary | ICD-10-CM | POA: Diagnosis not present

## 2018-09-21 DIAGNOSIS — D869 Sarcoidosis, unspecified: Secondary | ICD-10-CM | POA: Diagnosis not present

## 2018-09-21 DIAGNOSIS — D72818 Other decreased white blood cell count: Secondary | ICD-10-CM

## 2018-09-21 DIAGNOSIS — E039 Hypothyroidism, unspecified: Secondary | ICD-10-CM | POA: Diagnosis not present

## 2018-09-21 DIAGNOSIS — Z809 Family history of malignant neoplasm, unspecified: Secondary | ICD-10-CM | POA: Diagnosis not present

## 2018-09-21 DIAGNOSIS — M3130 Wegener's granulomatosis without renal involvement: Secondary | ICD-10-CM | POA: Diagnosis not present

## 2018-09-21 DIAGNOSIS — I10 Essential (primary) hypertension: Secondary | ICD-10-CM | POA: Diagnosis not present

## 2018-09-21 LAB — CBC WITH DIFFERENTIAL (CANCER CENTER ONLY)
BASOS ABS: 0 10*3/uL (ref 0.0–0.1)
Basophils Relative: 1 %
EOS PCT: 1 %
Eosinophils Absolute: 0 10*3/uL (ref 0.0–0.5)
HCT: 35.1 % (ref 34.8–46.6)
Hemoglobin: 12 g/dL (ref 11.6–15.9)
LYMPHS ABS: 0.7 10*3/uL — AB (ref 0.9–3.3)
LYMPHS PCT: 39 %
MCH: 32.1 pg (ref 25.1–34.0)
MCHC: 34.3 g/dL (ref 31.5–36.0)
MCV: 93.6 fL (ref 79.5–101.0)
MONO ABS: 0.1 10*3/uL (ref 0.1–0.9)
Monocytes Relative: 8 %
Neutro Abs: 1 10*3/uL — ABNORMAL LOW (ref 1.5–6.5)
Neutrophils Relative %: 51 %
PLATELETS: 89 10*3/uL — AB (ref 145–400)
RBC: 3.75 MIL/uL (ref 3.70–5.45)
RDW: 15.9 % — AB (ref 11.2–14.5)
WBC Count: 1.9 10*3/uL — ABNORMAL LOW (ref 3.9–10.3)

## 2018-09-21 MED ORDER — LIDOCAINE HCL 2 % IJ SOLN
INTRAMUSCULAR | Status: AC
  Filled 2018-09-21: qty 20

## 2018-09-21 NOTE — Progress Notes (Signed)
Bone marrow biopsy/aspiration performed by Thedore Mins, NP. Patient tolerated procedure well without any adverse events. Hemostasis achieved with direct pressure and sterile dressing applied. At time of discharge, dressing was clean, dry, and intact. Patient had no complaints of discomfort.

## 2018-09-21 NOTE — Telephone Encounter (Signed)
Per 9/27 los, no orders.

## 2018-09-21 NOTE — Progress Notes (Signed)
INDICATION: leukopenia, thrombocytopenia    Bone Marrow Biopsy and Aspiration Procedure Note   Informed consent was obtained and potential risks including bleeding, infection and pain were reviewed with the patient.  The patient's name, date of birth, identification, consent and allergies were verified prior to the start of procedure and time out was performed.  The left posterior iliac crest was chosen as the site of biopsy.  The skin was prepped with ChloraPrep.   8 cc of 2% lidocaine was used to provide local anaesthesia.   10 cc of bone marrow aspirate was obtained followed by 1cm biopsy.  Pressure was applied to the biopsy site and bandage was placed over the biopsy site. Patient was made to lie on the back for 30 mins prior to discharge.  The procedure was tolerated well. COMPLICATIONS: None BLOOD LOSS: none The patient was discharged home in stable condition with a 3 week follow up to review results.  Patient was provided with post bone marrow biopsy instructions and instructed to call if there was any bleeding or worsening pain.  Specimens sent for flow cytometry, cytogenetics and additional studies.  Signed Scot Dock, NP

## 2018-09-21 NOTE — Patient Instructions (Signed)
Bone Marrow Aspiration and Bone Marrow Biopsy, Adult, Care After This sheet gives you information about how to care for yourself after your procedure. Your health care provider may also give you more specific instructions. If you have problems or questions, contact your health care provider. What can I expect after the procedure? After the procedure, it is common to have:  Mild pain and tenderness.  Swelling.  Bruising.  Follow these instructions at home:  Take over-the-counter or prescription medicines only as told by your health care provider.  Do not take baths, swim, or use a hot tub until your health care provider approves. Ask if you can take a shower or have a sponge bath.  Follow instructions from your health care provider about how to take care of the puncture site. Make sure you: ? Wash your hands with soap and water before you change your bandage (dressing). If soap and water are not available, use hand sanitizer. ? Change your dressing as told by your health care provider.  Check your puncture siteevery day for signs of infection. Check for: ? More redness, swelling, or pain. ? More fluid or blood. ? Warmth. ? Pus or a bad smell.  Return to your normal activities as told by your health care provider. Ask your health care provider what activities are safe for you.  Do not drive for 24 hours if you were given a medicine to help you relax (sedative).  Keep all follow-up visits as told by your health care provider. This is important. Contact a health care provider if:  You have more redness, swelling, or pain around the puncture site.  You have more fluid or blood coming from the puncture site.  Your puncture site feels warm to the touch.  You have pus or a bad smell coming from the puncture site.  You have a fever.  Your pain is not controlled with medicine. This information is not intended to replace advice given to you by your health care provider. Make sure  you discuss any questions you have with your health care provider. Document Released: 07/01/2005 Document Revised: 07/01/2016 Document Reviewed: 05/25/2016 Elsevier Interactive Patient Education  2018 [employer].

## 2018-10-03 ENCOUNTER — Encounter (HOSPITAL_COMMUNITY): Payer: Self-pay | Admitting: Internal Medicine

## 2018-10-14 DIAGNOSIS — M25552 Pain in left hip: Secondary | ICD-10-CM | POA: Diagnosis not present

## 2018-10-14 DIAGNOSIS — M25551 Pain in right hip: Secondary | ICD-10-CM | POA: Diagnosis not present

## 2018-10-15 ENCOUNTER — Encounter: Payer: Self-pay | Admitting: Oncology

## 2018-10-15 ENCOUNTER — Inpatient Hospital Stay: Payer: Medicare Other | Attending: Oncology | Admitting: Oncology

## 2018-10-15 VITALS — BP 134/89 | HR 99 | Temp 97.8°F | Resp 20 | Ht 67.0 in | Wt 233.5 lb

## 2018-10-15 DIAGNOSIS — D696 Thrombocytopenia, unspecified: Secondary | ICD-10-CM | POA: Diagnosis not present

## 2018-10-15 DIAGNOSIS — Z79899 Other long term (current) drug therapy: Secondary | ICD-10-CM | POA: Insufficient documentation

## 2018-10-15 DIAGNOSIS — M545 Low back pain: Secondary | ICD-10-CM

## 2018-10-15 DIAGNOSIS — K7030 Alcoholic cirrhosis of liver without ascites: Secondary | ICD-10-CM | POA: Diagnosis not present

## 2018-10-15 DIAGNOSIS — D72819 Decreased white blood cell count, unspecified: Secondary | ICD-10-CM | POA: Insufficient documentation

## 2018-10-15 NOTE — Progress Notes (Signed)
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  PRIOR THERAPY: None  CURRENT THERAPY: None  INTERVAL HISTORY: Erin [name] 52 y.o. female returns for routine follow-up visit by herself.  The patient is feeling fine today but is nervous about her bone marrow biopsy results.  She denies fevers and chills.  Denies chest pain, shortness of breath, cough, hemoptysis.  She reports that she has had nausea with vomiting over the past few weeks.  She also has increased back pain over the past few weeks.  Denies constipation diarrhea.  Denies recent weight loss and night sweats.  The patient is here for discussion of her bone marrow biopsy results.  MEDICAL HISTORY: Past Medical History:  Diagnosis Date  . Anxiety   . Bipolar 1 disorder ([place])   . Blood transfusion July 2012  . Bronchitis   . Cirrhosis of liver ([place])   . Depression   . History of sarcoidosis   . Hypothyroidism   . Liver failure ([place])   . Menorrhagia   . Wegener's granulomatosis ([place])     ALLERGIES:  is allergic to fluoxetine hcl; metoclopramide hcl; and morphine and related.  MEDICATIONS:  Current Outpatient Medications  Medication Sig Dispense Refill  . Ascorbic Acid (VITAMIN C) POWD Take 1,000 mg by mouth daily.    . benzonatate (TESSALON) 100 MG capsule Take 1-2 capsules (100-200 mg total) by mouth 3 (three) times daily as needed for up to 60 doses for cough. (Patient not taking: Reported on 03/07/2018) 20 capsule 0  . budesonide-formoterol (SYMBICORT) 160-4.5 MCG/ACT inhaler Inhale 2 puffs into the lungs 2 (two) times daily.    . budesonide-formoterol (SYMBICORT) 80-4.5 MCG/ACT inhaler Inhale 2 puffs into the lungs 2 (two) times daily. (Patient not taking: Reported on 03/07/2018) 1 Inhaler 3  . diclofenac (VOLTAREN) 75 MG EC tablet Take 1 tablet (75 mg total) by mouth 2 (two) times  daily. (Patient not taking: Reported on 03/07/2018) 14 tablet 0  . hydrochlorothiazide (HYDRODIURIL) 25 MG tablet Take 1 tablet (25 mg total) by mouth daily. For high blood pressure 90 tablet 1  . lactulose (CHRONULAC) 10 GM/15ML solution Take 30 mLs (20 g total) by mouth 3 (three) times daily. 473 mL 1  . lactulose, encephalopathy, (CHRONULAC) 10 GM/15ML SOLN Take 20 g by mouth 3 (three) times daily.    Marland Kitchen levothyroxine (SYNTHROID, LEVOTHROID) 50 MCG tablet Take 50 mcg by mouth daily before breakfast.    . losartan (COZAAR) 25 MG tablet Take 25 mg by mouth daily.    . montelukast (SINGULAIR) 10 MG tablet Take 1 tablet (10 mg total) by mouth at bedtime. 30 tablet 3  . ondansetron (ZOFRAN) 4 MG tablet Take 1 tablet (4 mg total) by mouth every 8 (eight) hours as needed for nausea or vomiting. 30 tablet 1  . ondansetron (ZOFRAN-ODT) 4 MG disintegrating tablet Take 1 tablet (4 mg total) by mouth every 8 (eight) hours as needed for nausea or vomiting. 15 tablet 0  . polyethylene glycol powder (GLYCOLAX/MIRALAX) powder Take 1 Container by mouth as needed.  5  . thyroid (ARMOUR) 30 MG tablet Take 1 tablet (30 mg total) by mouth daily before breakfast. For low thyroid function (Patient not taking: Reported on 03/07/2018) 90 tablet 1   No current facility-administered medications for this visit.     SURGICAL HISTORY:  Past Surgical History:  Procedure Laterality  Date  . CHOLECYSTECTOMY    . FRACTURE SURGERY    . HERNIA REPAIR    . LAPAROSCOPY    . LAPAROTOMY  07/28/2011   Procedure: EXPLORATORY LAPAROTOMY;  Surgeon: [name], MD;  Location: [facility];  Service: Gynecology;  Laterality: N/A;  . ORTHOPEDIC SURGERY    . TUBAL LIGATION    . VAGINAL HYSTERECTOMY  07/27/2011   Procedure: HYSTERECTOMY VAGINAL;  Surgeon: [name], MD;  Location: [facility];  Service: Gynecology;  Laterality: N/A;  Vaginal Hysterectomy with Right Oophorectomy    REVIEW OF SYSTEMS:   Review of Systems  Constitutional:  Negative for appetite change, chills, fatigue, fever and unexpected weight change.  HENT:   Negative for mouth sores, nosebleeds, sore throat and trouble swallowing.   Eyes: Negative for eye problems and icterus.  Respiratory: Negative for cough, hemoptysis, shortness of breath and wheezing.   Cardiovascular: Negative for chest pain and leg swelling.  Gastrointestinal: Negative for abdominal pain, constipation, diarrhea.  Positive for nausea and vomiting. Genitourinary: Negative for bladder incontinence, difficulty urinating, dysuria, frequency and hematuria.   Musculoskeletal: Negative for gait problem, neck pain and neck stiffness.  Positive for back pain. Skin: Negative for itching and rash.  Neurological: Negative for dizziness, extremity weakness, gait problem, headaches, light-headedness and seizures.  Hematological: Negative for adenopathy. Does not bruise/bleed easily.  Psychiatric/Behavioral: Negative for confusion, depression and sleep disturbance.  Reports anxiety.   PHYSICAL EXAMINATION:  Blood pressure 134/89, pulse 99, temperature 97.8 F (36.6 C), temperature source Oral, resp. rate 20, height '5\' 7"'  (1.702 m), weight 233 lb 8 oz (105.9 kg), SpO2 95 %.  ECOG PERFORMANCE STATUS: 1 - Symptomatic but completely ambulatory  Physical Exam  Constitutional: Oriented to person, place, and time and well-developed, well-nourished, and in no distress. No distress.  HENT:  Head: Normocephalic and atraumatic.  Mouth/Throat: Oropharynx is clear and moist. No oropharyngeal exudate.  Eyes: Conjunctivae are normal. Right eye exhibits no discharge. Left eye exhibits no discharge. No scleral icterus.  Neck: Normal range of motion. Neck supple.  Cardiovascular: Normal rate, regular rhythm, normal heart sounds and intact distal pulses.   Pulmonary/Chest: Effort normal and breath sounds normal. No respiratory distress. No wheezes. No rales.  Abdominal: Soft. Bowel sounds are normal. Exhibits no  distension and no mass. There is no tenderness.  Musculoskeletal: Normal range of motion. Exhibits no edema.  Lymphadenopathy:    No cervical adenopathy.  Neurological: Alert and oriented to person, place, and time. Exhibits normal muscle tone. Gait normal. Coordination normal.  Skin: Skin is warm and dry. No rash noted. Not diaphoretic. No erythema. No pallor.  Psychiatric: Mood, memory and judgment normal.  Vitals reviewed.  LABORATORY DATA: Lab Results  Component Value Date   WBC 1.9 (L) 09/21/2018   HGB 12.0 09/21/2018   HCT 35.1 09/21/2018   MCV 93.6 09/21/2018   PLT 89 (L) 09/21/2018      Chemistry      Component Value Date/Time   NA 137 03/07/2018 0123   NA 139 01/12/2018 0917   K 3.9 03/07/2018 0123   CL 99 (L) 03/07/2018 0123   CO2 28 03/07/2018 0123   BUN 24 (H) 03/07/2018 0123   BUN 13 01/12/2018 0917   CREATININE 1.41 (H) 03/07/2018 0123      Component Value Date/Time   CALCIUM 9.4 03/07/2018 0123   ALKPHOS 98 03/07/2018 0123   AST 53 (H) 03/07/2018 0123   ALT 34 03/07/2018 0123   BILITOT 0.9 03/07/2018  0123   BILITOT 1.2 01/12/2018 0917       RADIOGRAPHIC STUDIES:  No results found.  PATHOLOGY:  Bone Marrow Flow Cytometry - NO MONOCLONAL B-CELL OR PHENOTYPICALLY ABERRANT T-CELL POPULATION IDENTIFIED - NO SIGNIFICANT BLAST POPULATION IDENTIFIED - SEE COMMENT Diagnosis Comment: Please see concurrent bone marrow biopsy ([id number]) for final diagnosis. Thressa Sheller MD Pathologist, Electronic Signature (Case signed 09/25/2018)  Cytogenetics normal.  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  Her leukopenia and thrombocytopenia date back over 10 years.  She had a recent bone marrow biopsy and is here to discuss the results.  The patient was seen with Dr. Julien Nordmann.  Bone marrow biopsy results and cytogenetics were discussed with the patient.  We discussed  that her bone marrow biopsy is normal.  Discussed that her leukopenia and thrombocytopenia is likely due to her alcoholic cirrhosis.  We gave the patient the option of following up with her primary care provider versus returning here in approximately 6 months for repeat lab work.  The patient will follow-up with [place] in approximately 6 months with repeat labs.  She was advised to call immediately if she has any concerning symptoms in the interval. The patient voices understanding of current disease status and treatment options and is in agreement with the current care plan.  All questions were answered. The patient knows to call the clinic with any problems, questions or concerns. We can certainly see the patient much sooner if necessary.  Orders Placed This Encounter  Procedures  . CBC with Differential (Cancer Center Only)    Standing Status:   Future    Standing Expiration Date:   10/16/2019     Mikey Bussing, DNP, AGPCNP-BC, AOCNP 10/15/18   ADDENDUM: Hematology/Oncology Attending: I had a face-to-face encounter with the patient.  I recommended her care plan.  This is a very pleasant 52 years old white female with persistent leukocytopenia and thrombocytopenia that has been going on for many years and likely secondary to alcoholic cirrhosis.  The patient underwent a bone marrow biopsy and aspirate that was unremarkable for any concerning findings but early myelodysplastic syndrome could not be completely ruled out.  The cytogenetics was not consistent with any abnormality. I recommended for the patient to continue on observation with routine follow-up visit by her primary care physician but we will see her one more time in 6 months for reevaluation and repeat blood work. The patient was advised to call immediately if she has any concerning symptoms in the interval.  Disclaimer: This note was dictated with voice recognition software. Similar sounding words can inadvertently be transcribed  and may be missed upon review. Eilleen Kempf, MD 10/15/18

## 2018-10-16 ENCOUNTER — Telehealth: Payer: Self-pay | Admitting: Oncology

## 2018-10-16 NOTE — Telephone Encounter (Signed)
Scheduled appt per 10/21 los - lab and f/u in 6 months - reminder letter sent in the mail.

## 2018-11-07 DIAGNOSIS — E039 Hypothyroidism, unspecified: Secondary | ICD-10-CM | POA: Diagnosis not present

## 2018-11-07 DIAGNOSIS — I10 Essential (primary) hypertension: Secondary | ICD-10-CM | POA: Diagnosis not present

## 2018-11-07 DIAGNOSIS — R7303 Prediabetes: Secondary | ICD-10-CM | POA: Diagnosis not present

## 2018-11-07 DIAGNOSIS — J4530 Mild persistent asthma, uncomplicated: Secondary | ICD-10-CM | POA: Diagnosis not present

## 2018-11-07 DIAGNOSIS — Z720 Tobacco use: Secondary | ICD-10-CM | POA: Diagnosis not present

## 2018-11-12 DIAGNOSIS — R0600 Dyspnea, unspecified: Secondary | ICD-10-CM | POA: Diagnosis not present

## 2018-11-14 DIAGNOSIS — M25551 Pain in right hip: Secondary | ICD-10-CM | POA: Diagnosis not present

## 2018-11-14 DIAGNOSIS — M25552 Pain in left hip: Secondary | ICD-10-CM | POA: Diagnosis not present

## 2018-12-14 DIAGNOSIS — M25551 Pain in right hip: Secondary | ICD-10-CM | POA: Diagnosis not present

## 2018-12-14 DIAGNOSIS — M25552 Pain in left hip: Secondary | ICD-10-CM | POA: Diagnosis not present

## 2018-12-20 ENCOUNTER — Other Ambulatory Visit: Payer: Self-pay

## 2018-12-20 ENCOUNTER — Encounter (HOSPITAL_COMMUNITY): Payer: Self-pay | Admitting: Emergency Medicine

## 2018-12-20 ENCOUNTER — Emergency Department (HOSPITAL_COMMUNITY)
Admission: EM | Admit: 2018-12-20 | Discharge: 2018-12-21 | Disposition: A | Discharge status: Other Acute Inpatient Hospital | Payer: Medicare Other | Source: Home / Self Care | Attending: Emergency Medicine | Admitting: Emergency Medicine

## 2018-12-20 DIAGNOSIS — F329 Major depressive disorder, single episode, unspecified: Secondary | ICD-10-CM

## 2018-12-20 DIAGNOSIS — F17210 Nicotine dependence, cigarettes, uncomplicated: Secondary | ICD-10-CM | POA: Insufficient documentation

## 2018-12-20 DIAGNOSIS — F1199 Opioid use, unspecified with unspecified opioid-induced disorder: Secondary | ICD-10-CM

## 2018-12-20 DIAGNOSIS — Z79899 Other long term (current) drug therapy: Secondary | ICD-10-CM | POA: Insufficient documentation

## 2018-12-20 DIAGNOSIS — F332 Major depressive disorder, recurrent severe without psychotic features: Secondary | ICD-10-CM | POA: Diagnosis present

## 2018-12-20 DIAGNOSIS — F32A Depression, unspecified: Secondary | ICD-10-CM

## 2018-12-20 DIAGNOSIS — F10920 Alcohol use, unspecified with intoxication, uncomplicated: Secondary | ICD-10-CM

## 2018-12-20 DIAGNOSIS — F1014 Alcohol abuse with alcohol-induced mood disorder: Secondary | ICD-10-CM | POA: Diagnosis present

## 2018-12-20 DIAGNOSIS — I10 Essential (primary) hypertension: Secondary | ICD-10-CM

## 2018-12-20 DIAGNOSIS — F1099 Alcohol use, unspecified with unspecified alcohol-induced disorder: Secondary | ICD-10-CM

## 2018-12-20 DIAGNOSIS — R45851 Suicidal ideations: Secondary | ICD-10-CM

## 2018-12-20 DIAGNOSIS — F1120 Opioid dependence, uncomplicated: Secondary | ICD-10-CM

## 2018-12-20 LAB — RAPID URINE DRUG SCREEN, HOSP PERFORMED
Amphetamines: NOT DETECTED
Barbiturates: NOT DETECTED
Benzodiazepines: NOT DETECTED
Cocaine: NOT DETECTED
Opiates: POSITIVE — AB
Tetrahydrocannabinol: NOT DETECTED

## 2018-12-20 LAB — COMPREHENSIVE METABOLIC PANEL WITH GFR
ALT: 22 U/L (ref 0–44)
AST: 64 U/L — ABNORMAL HIGH (ref 15–41)
Albumin: 4.4 g/dL (ref 3.5–5.0)
Alkaline Phosphatase: 101 U/L (ref 38–126)
Anion gap: 12 (ref 5–15)
BUN: 15 mg/dL (ref 6–20)
CO2: 22 mmol/L (ref 22–32)
Calcium: 8.8 mg/dL — ABNORMAL LOW (ref 8.9–10.3)
Chloride: 105 mmol/L (ref 98–111)
Creatinine, Ser: 0.78 mg/dL (ref 0.44–1.00)
GFR calc Af Amer: >60 mL/min
GFR calc non Af Amer: >60 mL/min
Glucose, Bld: 169 mg/dL — ABNORMAL HIGH (ref 70–99)
Potassium: 3.1 mmol/L — ABNORMAL LOW (ref 3.5–5.1)
Sodium: 139 mmol/L (ref 135–145)
Total Bilirubin: 1 mg/dL (ref 0.3–1.2)
Total Protein: 7.4 g/dL (ref 6.5–8.1)

## 2018-12-20 LAB — CBC
HCT: 38.3 % (ref 36.0–46.0)
Hemoglobin: 12.8 g/dL (ref 12.0–15.0)
MCH: 33.6 pg (ref 26.0–34.0)
MCHC: 33.4 g/dL (ref 30.0–36.0)
MCV: 100.5 fL — ABNORMAL HIGH (ref 80.0–100.0)
Platelets: 40 K/uL — ABNORMAL LOW (ref 150–400)
RBC: 3.81 MIL/uL — ABNORMAL LOW (ref 3.87–5.11)
RDW: 15.4 % (ref 11.5–15.5)
WBC: 3.4 K/uL — ABNORMAL LOW (ref 4.0–10.5)
nRBC: 0 % (ref 0.0–0.2)

## 2018-12-20 LAB — ACETAMINOPHEN LEVEL: Acetaminophen (Tylenol), Serum: <10 ug/mL — ABNORMAL LOW (ref 10–30)

## 2018-12-20 LAB — I-STAT BETA HCG BLOOD, ED (MC, WL, AP ONLY): I-stat hCG, quantitative: <5 m[IU]/mL

## 2018-12-20 LAB — SALICYLATE LEVEL: Salicylate Lvl: <7 mg/dL (ref 2.8–30.0)

## 2018-12-20 LAB — TSH: TSH: 2.67 u[IU]/mL (ref 0.350–4.500)

## 2018-12-20 LAB — AMMONIA: Ammonia: 50 umol/L — ABNORMAL HIGH (ref 9–35)

## 2018-12-20 LAB — ETHANOL: Alcohol, Ethyl (B): 397 mg/dL

## 2018-12-20 MED ORDER — LEVOTHYROXINE SODIUM 50 MCG PO TABS
50.0000 ug | ORAL_TABLET | Freq: Every day | ORAL | Status: DC
  Administered 2018-12-21: 50 ug via ORAL
  Filled 2018-12-20: qty 1

## 2018-12-20 MED ORDER — LACTULOSE 10 GM/15ML PO SOLN
20.0000 g | Freq: Three times a day (TID) | ORAL | Status: DC
  Administered 2018-12-20 – 2018-12-21 (×2): 20 g via ORAL
  Filled 2018-12-20 (×3): qty 30

## 2018-12-20 MED ORDER — ONDANSETRON HCL 4 MG PO TABS: 4.0000 mg | ORAL_TABLET | Freq: Three times a day (TID) | ORAL | Status: DC | PRN

## 2018-12-20 MED ORDER — HYDROCHLOROTHIAZIDE 25 MG PO TABS
25.0000 mg | ORAL_TABLET | Freq: Every day | ORAL | Status: DC
  Administered 2018-12-21: 25 mg via ORAL
  Filled 2018-12-20: qty 1

## 2018-12-20 MED ORDER — POTASSIUM CHLORIDE CRYS ER 20 MEQ PO TBCR
40.0000 meq | EXTENDED_RELEASE_TABLET | Freq: Once | ORAL | Status: AC
  Administered 2018-12-20: 40 meq via ORAL
  Filled 2018-12-20: qty 2

## 2018-12-20 MED ORDER — MOMETASONE FURO-FORMOTEROL FUM 200-5 MCG/ACT IN AERO
2.0000 | INHALATION_SPRAY | Freq: Two times a day (BID) | RESPIRATORY_TRACT | Status: DC
  Filled 2018-12-20: qty 8.8

## 2018-12-20 MED ORDER — LOSARTAN POTASSIUM 25 MG PO TABS
25.0000 mg | ORAL_TABLET | Freq: Every day | ORAL | Status: DC
  Administered 2018-12-21: 25 mg via ORAL
  Filled 2018-12-20: qty 1

## 2018-12-20 MED ORDER — LORAZEPAM 2 MG/ML IJ SOLN: 0.0000 mg | Freq: Four times a day (QID) | INTRAMUSCULAR | Status: DC

## 2018-12-20 MED ORDER — LORAZEPAM 1 MG PO TABS
0.0000 mg | ORAL_TABLET | Freq: Four times a day (QID) | ORAL | Status: DC
  Administered 2018-12-20: 2 mg via ORAL
  Administered 2018-12-21: 1 mg via ORAL
  Administered 2018-12-21: 2 mg via ORAL
  Filled 2018-12-20 (×2): qty 2
  Filled 2018-12-20: qty 1

## 2018-12-20 MED ORDER — THIAMINE HCL 100 MG/ML IJ SOLN: 100.0000 mg | Freq: Every day | INTRAMUSCULAR | Status: DC

## 2018-12-20 MED ORDER — VITAMIN B-1 100 MG PO TABS
100.0000 mg | ORAL_TABLET | Freq: Every day | ORAL | Status: DC
  Administered 2018-12-20 – 2018-12-21 (×2): 100 mg via ORAL
  Filled 2018-12-20 (×2): qty 1

## 2018-12-20 MED ORDER — LORAZEPAM 2 MG/ML IJ SOLN: 0.0000 mg | Freq: Two times a day (BID) | INTRAMUSCULAR | Status: DC

## 2018-12-20 MED ORDER — LORAZEPAM 1 MG PO TABS: 0.0000 mg | ORAL_TABLET | Freq: Two times a day (BID) | ORAL | Status: DC

## 2018-12-20 NOTE — ED Provider Notes (Signed)
[facility] DEPT Provider Note   CSN: [id number] Arrival date & time: 12/20/18  1652     History   Chief Complaint Chief Complaint  Patient presents with  . Suicidal  . Alcohol Intoxication  . Depression    HPI Erin [name] is a 52 y.o. female with a past medical history of bipolar 1, polysubstance abuse including cocaine, opioids and tobacco, alcoholism, liver failure, Wegener's, hypothyroid, who presents today for evaluation of reported suicidal ideations.  She tells me that her best friend brought her here.  According to nursing tech initially patient thought that today was the 25th.  She tells me that she has been all alone this Christmas, she says that her children told her they were going to come visit and then did not.  She is very tearful when telling me this.  She was reportedly reporting SI per triage note.  She tells me that she drinks "a lot."  She reportedly had 2 four loko drinks and also drinks a fifth of vodka a day.  She denies AVH.   She has not been taking any of her medications.   HPI  Past Medical History:  Diagnosis Date  . Anxiety   . Bipolar 1 disorder ([place])   . Blood transfusion July 2012  . Bronchitis   . Cirrhosis of liver ([place])   . Depression   . History of sarcoidosis   . Hypothyroidism   . Liver failure ([place])   . Menorrhagia   . Wegener's granulomatosis [facility])     Patient Active Problem List   Diagnosis Date Noted  . Leukopenia 10/15/2018  . Benzodiazepine abuse ([place]) 02/16/2017  . Substance induced mood disorder ([place]) 02/16/2017  . Alcohol use disorder, severe, dependence ([place]) 01/12/2016  . Cocaine dependence with cocaine-induced mood disorder ([place])   . Opioid dependence with withdrawal ([place])   . Bipolar disorder ([place]) 02/09/2015  . Alcohol dependence with alcohol-induced mood disorder ([place])   . Suicidal ideations   . Opioid dependence ([place]) 05/06/2014  . Cocaine dependence ([place]) 07/17/2013  .  Thrombocytopenia ([place]) 01/06/2013  . Sarcoidosis 03/30/2010  . HYPERTENSION, BENIGN ESSENTIAL 01/11/2010  . Hypothyroidism 03/02/2009  . Alcoholic liver damage ([place]) 03/06/2008  . CIRRHOSIS, ALCOHOLIC, LIVER 16/60/6301  . TOBACCO DEPENDENCE 02/22/2007  . EXTERNAL HEMORRHOIDS 01/11/2005  . PORTAL HYPERTENSION 01/11/2005    Past Surgical History:  Procedure Laterality Date  . CHOLECYSTECTOMY    . FRACTURE SURGERY    . HERNIA REPAIR    . LAPAROSCOPY    . LAPAROTOMY  07/28/2011   Procedure: EXPLORATORY LAPAROTOMY;  Surgeon: [name], MD;  Location: [facility];  Service: Gynecology;  Laterality: N/A;  . ORTHOPEDIC SURGERY    . TUBAL LIGATION    . VAGINAL HYSTERECTOMY  07/27/2011   Procedure: HYSTERECTOMY VAGINAL;  Surgeon: [name], MD;  Location: [facility];  Service: Gynecology;  Laterality: N/A;  Vaginal Hysterectomy with Right Oophorectomy     OB History    Gravida  4   Para  3   Term  3   Preterm  0   AB  1   Living  3     SAB      TAB      Ectopic      Multiple      Live Births               Home Medications    Prior to Admission medications   Medication Sig Start Date  End Date Taking? Authorizing Provider  Ascorbic Acid (VITAMIN C) POWD Take 1,000 mg by mouth daily.   Yes [provider]  atomoxetine (STRATTERA) 100 MG capsule Take 1 capsule by mouth daily. 10/10/18  Yes [provider]  budesonide-formoterol (SYMBICORT) 160-4.5 MCG/ACT inhaler Inhale 2 puffs into the lungs 2 (two) times daily.   Yes [provider]  hydrochlorothiazide (HYDRODIURIL) 25 MG tablet Take 1 tablet (25 mg total) by mouth daily. For high blood pressure 01/12/18  Yes Scot Jun, FNP  levothyroxine (SYNTHROID, LEVOTHROID) 50 MCG tablet Take 50 mcg by mouth daily before breakfast.   Yes [provider]  losartan (COZAAR) 25 MG tablet Take 25 mg by mouth daily.   Yes [provider]  prazosin (MINIPRESS) 2 MG capsule Take 1  capsule by mouth at bedtime. 09/10/18  Yes [provider]  benzonatate (TESSALON) 100 MG capsule Take 1-2 capsules (100-200 mg total) by mouth 3 (three) times daily as needed for up to 60 doses for cough. Patient not taking: Reported on 03/07/2018 02/12/18   Scot Jun, FNP  lactulose (CHRONULAC) 10 GM/15ML solution Take 30 mLs (20 g total) by mouth 3 (three) times daily. Patient not taking: Reported on 12/20/2018 02/02/18   Scot Jun, FNP  montelukast (SINGULAIR) 10 MG tablet Take 1 tablet (10 mg total) by mouth at bedtime. Patient not taking: Reported on 10/15/2018 01/12/18   Scot Jun, FNP  ondansetron (ZOFRAN) 4 MG tablet Take 1 tablet (4 mg total) by mouth every 8 (eight) hours as needed for nausea or vomiting. Patient not taking: Reported on 10/15/2018 03/13/18   Scot Jun, FNP  thyroid (ARMOUR) 30 MG tablet Take 1 tablet (30 mg total) by mouth daily before breakfast. For low thyroid function Patient not taking: Reported on 03/07/2018 01/12/18   Scot Jun, FNP    Family History Family History  Problem Relation Age of Onset  . Diabetes Mother   . Hypertension Mother   . Diabetes Daughter   . Hypertension Daughter     Social History Social History   Tobacco Use  . Smoking status: Current Every Day Smoker    Packs/day: 0.25    Types: Cigarettes  . Smokeless tobacco: Never Used  Substance Use Topics  . Alcohol use: Yes    Comment: went 8 months without drinking but drank 2 today to try to feel better  . Drug use: Yes    Types: Marijuana     Allergies   Fluoxetine hcl; Metoclopramide hcl; and Morphine and related   Review of Systems Review of Systems  Constitutional: Negative for chills and fever.  HENT: Negative for congestion.   Respiratory: Negative for chest tightness and shortness of breath.   Cardiovascular: Negative for chest pain.  Gastrointestinal: Positive for nausea. Negative for abdominal pain, diarrhea and  vomiting.  Genitourinary: Negative for dysuria and urgency.  Musculoskeletal: Negative for back pain.  Psychiatric/Behavioral: Negative for agitation and confusion. The patient is not nervous/anxious and is not hyperactive.   All other systems reviewed and are negative.    Physical Exam Updated Vital Signs BP (!) 137/97 (BP Location: Left Arm)   Pulse 98   Temp 97.6 F (36.4 C) (Oral)   Resp 19   Ht 5\' 7"  (1.702 m)   Wt 110.2 kg   SpO2 95%   BMI 38.06 kg/m   Physical Exam Vitals signs and nursing note reviewed.  Constitutional:      General: She is  not in acute distress.    Appearance: She is well-developed. She is obese.  HENT:     Head: Normocephalic and atraumatic.  Eyes:     Conjunctiva/sclera: Conjunctivae normal.  Neck:     Musculoskeletal: Neck supple.  Cardiovascular:     Rate and Rhythm: Normal rate and regular rhythm.     Heart sounds: No murmur.  Pulmonary:     Effort: Pulmonary effort is normal. No respiratory distress.     Breath sounds: Normal breath sounds.  Abdominal:     Palpations: Abdomen is soft.     Tenderness: There is no abdominal tenderness.  Skin:    General: Skin is warm and dry.  Neurological:     General: No focal deficit present.     Mental Status: She is alert and oriented to person, place, and time.  Psychiatric:        Attention and Perception: She perceives visual (While in mid conversation will stop and appears to be watching and visually tracking something that isn't there. ) hallucinations. She does not perceive auditory hallucinations.        Mood and Affect: Mood is depressed. Affect is blunt and tearful.        Speech: Speech is slurred.        Behavior: Behavior is agitated.        Thought Content: Thought content does not include homicidal or suicidal ideation. Thought content does not include homicidal or suicidal plan.     Comments: She denies SI/HI.        ED Treatments / Results  Labs (all labs ordered are listed,  but only abnormal results are displayed) Labs Reviewed  COMPREHENSIVE METABOLIC PANEL - Abnormal; Notable for the following components:      Result Value   Potassium 3.1 (*)    Glucose, Bld 169 (*)    Calcium 8.8 (*)    AST 64 (*)    All other components within normal limits  ETHANOL - Abnormal; Notable for the following components:   Alcohol, Ethyl (B) 397 (*)    All other components within normal limits  ACETAMINOPHEN LEVEL - Abnormal; Notable for the following components:   Acetaminophen (Tylenol), Serum <10 (*)    All other components within normal limits  CBC - Abnormal; Notable for the following components:   WBC 3.4 (*)    RBC 3.81 (*)    MCV 100.5 (*)    Platelets 40 (*)    All other components within normal limits  RAPID URINE DRUG SCREEN, HOSP PERFORMED - Abnormal; Notable for the following components:   Opiates POSITIVE (*)    All other components within normal limits  AMMONIA - Abnormal; Notable for the following components:   Ammonia 50 (*)    All other components within normal limits  SALICYLATE LEVEL  TSH  I-STAT BETA HCG BLOOD, ED ([facility], [facility], [facility] ONLY)    EKG None  Radiology No results found.  Procedures Procedures (including critical care time)  Medications Ordered in ED Medications  LORazepam (ATIVAN) injection 0-4 mg ( Intravenous See Alternative 12/20/18 2152)    Or  LORazepam (ATIVAN) tablet 0-4 mg (2 mg Oral Given 12/20/18 2152)  LORazepam (ATIVAN) injection 0-4 mg (has no administration in time range)    Or  LORazepam (ATIVAN) tablet 0-4 mg (has no administration in time range)  thiamine (VITAMIN B-1) tablet 100 mg (100 mg Oral Given 12/20/18 2152)    Or  thiamine (B-1) injection 100 mg (  Intravenous See Alternative 12/20/18 2152)  lactulose (CHRONULAC) 10 GM/15ML solution 20 g (has no administration in time range)  ondansetron (ZOFRAN) tablet 4 mg (has no administration in time range)  hydrochlorothiazide (HYDRODIURIL) tablet 25 mg (has no  administration in time range)  levothyroxine (SYNTHROID, LEVOTHROID) tablet 50 mcg (has no administration in time range)  losartan (COZAAR) tablet 25 mg (has no administration in time range)  mometasone-formoterol (DULERA) 200-5 MCG/ACT inhaler 2 puff (has no administration in time range)  potassium chloride SA (K-DUR,KLOR-CON) CR tablet 40 mEq (has no administration in time range)     Initial Impression / Assessment and Plan / ED Course  I have reviewed the triage vital signs and the nursing notes.  Pertinent labs & imaging results that were available during my care of the patient were reviewed by me and considered in my medical decision making (see chart for details).  Clinical Course as of Dec 20 2258  Thu Dec 20, 2018  2102 Patient reevaluated, she is sleeping comfortably, is able to walk to the bathroom without difficulty.   [EH]    Clinical Course User Index [EH] Lorin Glass, PA-C   Patient presents today for evaluation of reported suicidal ideations and alcohol intoxication.  When I spoke with patient she denies SI and HI.  She also denies AVH however appears to be visually hallucinating.  She is clinically intoxicated and her speech is slightly slurred.  Labs were obtained and reviewed, alcohol is elevated at 397 consistent with her clinical picture.  Her UDS was positive for opioids, and she admits to taking pain pills whenever she can.  She has leukopenia with a white count of 3.4, chart review shows that she has been worked up for this previously.   She also has thrombocytopenia which is not new for her.  Her potassium is slightly low at 3.1, ordered 40 M EQ K-Dur.  She is placed on CIWA protocol.  Her ammonia is slightly elevated, however she does not appear encephalopathic.  Her lactulose was restarted, as she has not been taking her medications for multiple weeks.  Her medications were ordered, blood pressure, breathing, and thyroid medicines were reordered.  Patient  is medically clear for psychiatric disposition.  Final Clinical Impressions(s) / ED Diagnoses   Final diagnoses:  Alcoholic intoxication without complication [facility])  Depression, unspecified depression type    ED Discharge Orders    None       Ollen Gross 12/20/18 2302    Pattricia Boss, MD 12/20/18 2347

## 2018-12-20 NOTE — ED Notes (Signed)
Admitted from TCU to room 41 for a safe detox. She was admitted with an ETOH level of 397 states she has been drinking a 5th daily for one month and using pain pills too. UDS positive for opiates. She lives alone, has lost some people recently and Christmas made things worse. She is here for detox and would like rehab. CIWA=19 on admission to the unit. Very anxious and tremulous and has a hx of DTs. Will continue to monitor for safety.

## 2018-12-20 NOTE — Progress Notes (Signed)
current BAL 397. TTS to assess when pt's BAL is below 250

## 2018-12-20 NOTE — ED Notes (Signed)
Date and time results received: 12/20/18 6:26 PM  (use smartphrase ".now" to insert current time)  Test: ETOH Critical Value: 397  Name of Provider Notified: Benjamine Mola, [place]  Orders Received? Or Actions Taken?: Orders Received - See Orders for details

## 2018-12-20 NOTE — ED Notes (Signed)
Patient leaving department via triage. Dr. Jeanell Sparrow called to inform about SI patient leaving. Patient redirected back to room.

## 2018-12-20 NOTE — ED Notes (Signed)
Report to RN Patrecia Pace in [place],  Pending transfer.

## 2018-12-20 NOTE — ED Triage Notes (Signed)
Patient here from home with complaints of suicidal ideation, depression, and ETOH. Reports last drink was right before she came here. Reports hx of seizures when detoxing.

## 2018-12-20 NOTE — ED Notes (Signed)
Per RN, patient is not ready to be seen. RN will contact TTS when ready.

## 2018-12-20 NOTE — ED Notes (Signed)
Pt A&O x 3, no distress noted, calm & cooperative.  Monitoring for safety, pt intoxicated.   Sitter at bedside.

## 2018-12-21 ENCOUNTER — Encounter (HOSPITAL_COMMUNITY): Payer: Self-pay

## 2018-12-21 ENCOUNTER — Other Ambulatory Visit: Payer: Self-pay

## 2018-12-21 ENCOUNTER — Inpatient Hospital Stay (HOSPITAL_COMMUNITY)
Admission: AD | Admit: 2018-12-21 | Discharge: 2018-12-26 | DRG: 885 | Disposition: A | Discharge status: Home / Self Care | Payer: Medicare Other | Source: Intra-hospital | Attending: Psychiatry | Admitting: Psychiatry

## 2018-12-21 DIAGNOSIS — I10 Essential (primary) hypertension: Secondary | ICD-10-CM | POA: Diagnosis present

## 2018-12-21 DIAGNOSIS — R45851 Suicidal ideations: Secondary | ICD-10-CM | POA: Diagnosis present

## 2018-12-21 DIAGNOSIS — F332 Major depressive disorder, recurrent severe without psychotic features: Principal | ICD-10-CM

## 2018-12-21 DIAGNOSIS — F419 Anxiety disorder, unspecified: Secondary | ICD-10-CM | POA: Diagnosis not present

## 2018-12-21 DIAGNOSIS — Z23 Encounter for immunization: Secondary | ICD-10-CM

## 2018-12-21 DIAGNOSIS — Z79899 Other long term (current) drug therapy: Secondary | ICD-10-CM

## 2018-12-21 DIAGNOSIS — G4700 Insomnia, unspecified: Secondary | ICD-10-CM | POA: Diagnosis present

## 2018-12-21 DIAGNOSIS — Z9114 Patient's other noncompliance with medication regimen: Secondary | ICD-10-CM | POA: Diagnosis not present

## 2018-12-21 DIAGNOSIS — Z7951 Long term (current) use of inhaled steroids: Secondary | ICD-10-CM | POA: Diagnosis not present

## 2018-12-21 DIAGNOSIS — F10239 Alcohol dependence with withdrawal, unspecified: Secondary | ICD-10-CM | POA: Diagnosis present

## 2018-12-21 DIAGNOSIS — Z79890 Hormone replacement therapy (postmenopausal): Secondary | ICD-10-CM

## 2018-12-21 DIAGNOSIS — K7460 Unspecified cirrhosis of liver: Secondary | ICD-10-CM | POA: Diagnosis present

## 2018-12-21 DIAGNOSIS — M3130 Wegener's granulomatosis without renal involvement: Secondary | ICD-10-CM | POA: Diagnosis not present

## 2018-12-21 DIAGNOSIS — F1210 Cannabis abuse, uncomplicated: Secondary | ICD-10-CM | POA: Diagnosis present

## 2018-12-21 DIAGNOSIS — Z87891 Personal history of nicotine dependence: Secondary | ICD-10-CM

## 2018-12-21 DIAGNOSIS — E039 Hypothyroidism, unspecified: Secondary | ICD-10-CM | POA: Diagnosis not present

## 2018-12-21 DIAGNOSIS — F1020 Alcohol dependence, uncomplicated: Secondary | ICD-10-CM | POA: Diagnosis not present

## 2018-12-21 DIAGNOSIS — F4310 Post-traumatic stress disorder, unspecified: Secondary | ICD-10-CM | POA: Diagnosis present

## 2018-12-21 DIAGNOSIS — Z90710 Acquired absence of both cervix and uterus: Secondary | ICD-10-CM | POA: Diagnosis not present

## 2018-12-21 DIAGNOSIS — F1014 Alcohol abuse with alcohol-induced mood disorder: Secondary | ICD-10-CM | POA: Diagnosis present

## 2018-12-21 DIAGNOSIS — F1110 Opioid abuse, uncomplicated: Secondary | ICD-10-CM | POA: Diagnosis present

## 2018-12-21 DIAGNOSIS — Z90721 Acquired absence of ovaries, unilateral: Secondary | ICD-10-CM

## 2018-12-21 MED ORDER — DULOXETINE HCL 20 MG PO CPEP
20.0000 mg | ORAL_CAPSULE | Freq: Every day | ORAL | Status: DC
  Administered 2018-12-22 – 2018-12-23 (×2): 20 mg via ORAL
  Filled 2018-12-21 (×4): qty 1

## 2018-12-21 MED ORDER — ADULT MULTIVITAMIN W/MINERALS CH
1.0000 | ORAL_TABLET | Freq: Every day | ORAL | Status: DC
  Administered 2018-12-21 – 2018-12-26 (×6): 1 via ORAL
  Filled 2018-12-21 (×7): qty 1

## 2018-12-21 MED ORDER — ONDANSETRON 4 MG PO TBDP: 4.0000 mg | ORAL_TABLET | Freq: Four times a day (QID) | ORAL | Status: AC | PRN

## 2018-12-21 MED ORDER — GABAPENTIN 100 MG PO CAPS
100.0000 mg | ORAL_CAPSULE | Freq: Two times a day (BID) | ORAL | Status: DC
  Administered 2018-12-21 – 2018-12-25 (×8): 100 mg via ORAL
  Filled 2018-12-21 (×10): qty 1

## 2018-12-21 MED ORDER — HYDROXYZINE HCL 25 MG PO TABS
25.0000 mg | ORAL_TABLET | Freq: Four times a day (QID) | ORAL | Status: AC | PRN
  Administered 2018-12-22 – 2018-12-23 (×4): 25 mg via ORAL
  Filled 2018-12-21 (×4): qty 1

## 2018-12-21 MED ORDER — ALUM & MAG HYDROXIDE-SIMETH 200-200-20 MG/5ML PO SUSP
30.0000 mL | ORAL | Status: DC | PRN
  Administered 2018-12-25: 30 mL via ORAL
  Filled 2018-12-21: qty 30

## 2018-12-21 MED ORDER — LORAZEPAM 1 MG PO TABS
1.0000 mg | ORAL_TABLET | Freq: Two times a day (BID) | ORAL | Status: AC
  Administered 2018-12-23 (×2): 1 mg via ORAL
  Filled 2018-12-21 (×2): qty 1

## 2018-12-21 MED ORDER — GABAPENTIN 300 MG PO CAPS
300.0000 mg | ORAL_CAPSULE | Freq: Three times a day (TID) | ORAL | Status: DC
  Filled 2018-12-21 (×5): qty 1

## 2018-12-21 MED ORDER — LACTULOSE 10 GM/15ML PO SOLN
20.0000 g | Freq: Three times a day (TID) | ORAL | Status: DC
  Administered 2018-12-21 – 2018-12-26 (×15): 20 g via ORAL
  Filled 2018-12-21 (×18): qty 30

## 2018-12-21 MED ORDER — ACETAMINOPHEN 325 MG PO TABS: 650.0000 mg | ORAL_TABLET | Freq: Four times a day (QID) | ORAL | Status: DC | PRN

## 2018-12-21 MED ORDER — DICYCLOMINE HCL 20 MG PO TABS
20.0000 mg | ORAL_TABLET | Freq: Four times a day (QID) | ORAL | Status: DC | PRN
  Administered 2018-12-24: 20 mg via ORAL
  Filled 2018-12-21: qty 1

## 2018-12-21 MED ORDER — LORAZEPAM 2 MG/ML IJ SOLN: 0.0000 mg | Freq: Four times a day (QID) | INTRAMUSCULAR | Status: DC

## 2018-12-21 MED ORDER — LORAZEPAM 1 MG PO TABS: 0.0000 mg | ORAL_TABLET | Freq: Four times a day (QID) | ORAL | Status: DC

## 2018-12-21 MED ORDER — MAGNESIUM HYDROXIDE 400 MG/5ML PO SUSP: 30.0000 mL | Freq: Every day | ORAL | Status: DC | PRN

## 2018-12-21 MED ORDER — CLONIDINE HCL 0.1 MG PO TABS
0.1000 mg | ORAL_TABLET | ORAL | Status: AC
  Administered 2018-12-23 – 2018-12-24 (×4): 0.1 mg via ORAL
  Filled 2018-12-21 (×4): qty 1

## 2018-12-21 MED ORDER — LORAZEPAM 1 MG PO TABS
1.0000 mg | ORAL_TABLET | Freq: Once | ORAL | Status: AC
  Administered 2018-12-21: 1 mg via ORAL
  Filled 2018-12-21: qty 1

## 2018-12-21 MED ORDER — LEVOTHYROXINE SODIUM 50 MCG PO TABS
50.0000 ug | ORAL_TABLET | Freq: Every day | ORAL | Status: DC
  Administered 2018-12-22 – 2018-12-26 (×5): 50 ug via ORAL
  Filled 2018-12-21 (×6): qty 1

## 2018-12-21 MED ORDER — PNEUMOCOCCAL VAC POLYVALENT 25 MCG/0.5ML IJ INJ
0.5000 mL | INJECTION | INTRAMUSCULAR | Status: AC
  Administered 2018-12-22: 0.5 mL via INTRAMUSCULAR

## 2018-12-21 MED ORDER — LORAZEPAM 1 MG PO TABS: 0.0000 mg | ORAL_TABLET | Freq: Two times a day (BID) | ORAL | Status: DC

## 2018-12-21 MED ORDER — THIAMINE HCL 100 MG/ML IJ SOLN: 100.0000 mg | Freq: Every day | INTRAMUSCULAR | Status: DC

## 2018-12-21 MED ORDER — INFLUENZA VAC SPLIT QUAD 0.5 ML IM SUSY
0.5000 mL | PREFILLED_SYRINGE | INTRAMUSCULAR | Status: AC
  Administered 2018-12-22: 0.5 mL via INTRAMUSCULAR
  Filled 2018-12-21: qty 0.5

## 2018-12-21 MED ORDER — NAPROXEN 500 MG PO TABS
500.0000 mg | ORAL_TABLET | Freq: Two times a day (BID) | ORAL | Status: DC | PRN
  Administered 2018-12-24: 500 mg via ORAL
  Filled 2018-12-21: qty 1

## 2018-12-21 MED ORDER — CLONIDINE HCL 0.1 MG PO TABS
0.1000 mg | ORAL_TABLET | Freq: Four times a day (QID) | ORAL | Status: AC
  Administered 2018-12-21 – 2018-12-22 (×7): 0.1 mg via ORAL
  Filled 2018-12-21 (×8): qty 1

## 2018-12-21 MED ORDER — HYDROCHLOROTHIAZIDE 25 MG PO TABS
25.0000 mg | ORAL_TABLET | Freq: Every day | ORAL | Status: DC
  Administered 2018-12-22 – 2018-12-25 (×4): 25 mg via ORAL
  Filled 2018-12-21 (×5): qty 1

## 2018-12-21 MED ORDER — THIAMINE HCL 100 MG/ML IJ SOLN: 100.0000 mg | Freq: Once | INTRAMUSCULAR | Status: DC

## 2018-12-21 MED ORDER — LORAZEPAM 1 MG PO TABS
1.0000 mg | ORAL_TABLET | Freq: Four times a day (QID) | ORAL | Status: AC
  Administered 2018-12-21 (×3): 1 mg via ORAL
  Filled 2018-12-21 (×3): qty 1

## 2018-12-21 MED ORDER — MOMETASONE FURO-FORMOTEROL FUM 200-5 MCG/ACT IN AERO
2.0000 | INHALATION_SPRAY | Freq: Two times a day (BID) | RESPIRATORY_TRACT | Status: DC
  Administered 2018-12-22 – 2018-12-26 (×9): 2 via RESPIRATORY_TRACT
  Filled 2018-12-21: qty 8.8

## 2018-12-21 MED ORDER — POTASSIUM CHLORIDE CRYS ER 20 MEQ PO TBCR
20.0000 meq | EXTENDED_RELEASE_TABLET | Freq: Two times a day (BID) | ORAL | Status: AC
  Administered 2018-12-21 – 2018-12-22 (×3): 20 meq via ORAL
  Filled 2018-12-21 (×3): qty 1

## 2018-12-21 MED ORDER — VITAMIN B-1 100 MG PO TABS
100.0000 mg | ORAL_TABLET | Freq: Every day | ORAL | Status: DC
  Administered 2018-12-22 – 2018-12-26 (×5): 100 mg via ORAL
  Filled 2018-12-21 (×6): qty 1

## 2018-12-21 MED ORDER — VITAMIN B-1 100 MG PO TABS: 100.0000 mg | ORAL_TABLET | Freq: Every day | ORAL | Status: DC

## 2018-12-21 MED ORDER — LOSARTAN POTASSIUM 25 MG PO TABS
25.0000 mg | ORAL_TABLET | Freq: Every day | ORAL | Status: DC
  Administered 2018-12-22 – 2018-12-25 (×4): 25 mg via ORAL
  Filled 2018-12-21 (×5): qty 1

## 2018-12-21 MED ORDER — ONDANSETRON HCL 4 MG PO TABS: 4.0000 mg | ORAL_TABLET | Freq: Three times a day (TID) | ORAL | Status: DC | PRN

## 2018-12-21 MED ORDER — LOPERAMIDE HCL 2 MG PO CAPS: 2.0000 mg | ORAL_CAPSULE | ORAL | Status: AC | PRN

## 2018-12-21 MED ORDER — LORAZEPAM 2 MG/ML IJ SOLN: 0.0000 mg | Freq: Two times a day (BID) | INTRAMUSCULAR | Status: DC

## 2018-12-21 MED ORDER — LORAZEPAM 1 MG PO TABS
1.0000 mg | ORAL_TABLET | Freq: Every day | ORAL | Status: AC
  Administered 2018-12-24: 1 mg via ORAL
  Filled 2018-12-21: qty 1

## 2018-12-21 MED ORDER — LORAZEPAM 1 MG PO TABS
1.0000 mg | ORAL_TABLET | Freq: Three times a day (TID) | ORAL | Status: AC
  Administered 2018-12-22 (×3): 1 mg via ORAL
  Filled 2018-12-21 (×3): qty 1

## 2018-12-21 MED ORDER — GABAPENTIN 300 MG PO CAPS
300.0000 mg | ORAL_CAPSULE | Freq: Three times a day (TID) | ORAL | Status: DC
  Administered 2018-12-21: 300 mg via ORAL
  Filled 2018-12-21: qty 1

## 2018-12-21 MED ORDER — CLONIDINE HCL 0.1 MG PO TABS
0.1000 mg | ORAL_TABLET | Freq: Every day | ORAL | Status: DC
  Administered 2018-12-25: 0.1 mg via ORAL
  Filled 2018-12-21 (×2): qty 1

## 2018-12-21 MED ORDER — METHOCARBAMOL 500 MG PO TABS
500.0000 mg | ORAL_TABLET | Freq: Three times a day (TID) | ORAL | Status: DC | PRN
  Administered 2018-12-24 – 2018-12-26 (×3): 500 mg via ORAL
  Filled 2018-12-21 (×3): qty 1

## 2018-12-21 NOTE — Progress Notes (Signed)
Patient did not attend the evening speaker [place] meeting. Pt remained sleeping during group time.

## 2018-12-21 NOTE — [facility] Suicide Risk Assessment (Signed)
[facility] INPATIENT:  Family/Significant Other Suicide Prevention Education  Suicide Prevention Education:  Patient Refusal for Family/Significant Other Suicide Prevention Education: The patient Erin [name] has refused to provide written consent for family/significant other to be provided Family/Significant Other Suicide Prevention Education during admission and/or prior to discharge.  Physician notified.  SPE completed with pt, as pt refused to consent to family contact. SPI pamphlet provided to pt and pt was encouraged to share information with support network, ask questions, and talk about any concerns relating to SPE. Pt denies access to guns/firearms and verbalized understanding of information provided. Mobile Crisis information also provided to pt.   Avelina Laine LCSW 12/21/2018, 3:16 PM

## 2018-12-21 NOTE — ED Notes (Signed)
Offered her the [facility] inhaler now its available and woke her for her Ativan and she declined it. CIWA=9 and gave her 1mg  Ativan as ordered. She smells strongly of alcohol still but is pleasant and cooperative and expressed her thanks.

## 2018-12-21 NOTE — ED Notes (Signed)
Patient discharged in route to [facility].  Patient transported by Betsy Pries.
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
## 2018-12-21 NOTE — Tx Team (Signed)
Initial Treatment Plan 12/21/2018 2:03 PM [name] IAX:[id number]    PATIENT STRESSORS: Medication change or noncompliance Substance abuse   PATIENT STRENGTHS: Ability for insight Average or above average intelligence Financial means Motivation for treatment/growth   PATIENT IDENTIFIED PROBLEMS: "get sober"  "be less lonely"  Substance Abuse                 DISCHARGE CRITERIA:  Ability to meet basic life and health needs Adequate post-discharge living arrangements Improved stabilization in mood, thinking, and/or behavior Medical problems require only outpatient monitoring Motivation to continue treatment in a less acute level of care Need for constant or close observation no longer present Reduction of life-threatening or endangering symptoms to within safe limits Safe-care adequate arrangements made Verbal commitment to aftercare and medication compliance Withdrawal symptoms are absent or subacute and managed without 24-hour nursing intervention  PRELIMINARY DISCHARGE PLAN: Outpatient therapy  PATIENT/FAMILY INVOLVEMENT: This treatment plan has been presented to and reviewed with the patient, [name].  The patient and family have been given the opportunity to ask questions and make suggestions.  Annia Friendly, RN 12/21/2018, 2:03 PM
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
## 2018-12-21 NOTE — Progress Notes (Signed)
D: Pt denies SI/HI/AVH . Pt is pleasant and cooperative. Pt sleep much of the evening , pt stated she was doing ok, endorsing withdrawal Sx.   A: Pt was offered support and encouragement. Pt was given scheduled medications. Pt was encourage to attend groups. Q 15 minute checks were done for safety.  R:  safety maintained on unit.  Problem: Activity: Goal: Sleeping patterns will improve Outcome: Progressing   Problem: Coping: Goal: Ability to demonstrate self-control will improve Outcome: Progressing

## 2018-12-21 NOTE — BHH Counselor (Signed)
CSW met with pt and completed updated PSA. Pt declined [place] speaking with any family members or significant others. Pt would like to receive therapy and medication management services at [facility]. Pt signed ROI and they are in her chart.   Devondre Guzzetta S. [place], [place], MSW [facility]: Child and Adolescent  [phone]

## 2018-12-21 NOTE — BH Assessment (Addendum)
[facility] Assessment Progress Note  Per Buford Dresser, DO, this pt requires psychiatric hospitalization at this time.  [name], RN, [facility] has assigned pt to [facility] Rm 306-1.  Pt has signed Voluntary Admission and Consent for Treatment, as well as Consent to Release Information to no one, and signed forms have been faxed to [facility].  Pt's nurse, Caryl Pina, has been notified, and agrees to send original paperwork along with pt via [place] [place], and to call report to [phone].  Jalene Mullet, [place] Coordinator [phone]

## 2018-12-21 NOTE — Progress Notes (Signed)
Per Patriciaann Clan, PA pt meets criteria for inpt treatment. TTS to seek placement. EDP Lorin Glass, PA-C and pt's nurse Davina Poke, RN have been advised of the disposition.   Lind Covert, MSW, LCSW Therapeutic Triage Specialist  [phone]

## 2018-12-21 NOTE — H&P (Addendum)
Psychiatric Admission Assessment Adult  Patient Identification: Erin [name] MRN:  [id number] Date of Evaluation:  12/21/2018 Chief Complaint:  MDD Principal Diagnosis: Alcohol use disorder, severe, dependence ([place]) Diagnosis:  Principal Problem:   Alcohol use disorder, severe, dependence ([place]) Active Problems:   Major depressive disorder, recurrent severe without psychotic features ([place])  History of Present Illness: 52 year old female with history of alcohol, opioid, and cocaine abuse, depression, hypothyroidism, cirrhosis, and sarcoidosis presenting for treatment of depression in the context of opioid and alcohol abuse. Per chart review she endorsed suicidal ideation in the ED. Currently denies SI but states she needs help for her alcohol and opioid abuse. Her mother and stepfather both passed away recently, and her boyfriend died from an opioid overdose. She has increased her drinking to cope with these deaths. Reports drinking a fifth a day for the last 8 months and sometimes wine and beer, in bars and at home. She also reports daily use of opioids. Admission UDS positive for opioids, BAL 397. Last use of both alcohol and opioids was last night. She states she lives by herself and feels isolated, does not have contact with her four adult children. Going to bars is her main way of socializing with other people.  Reports low energy, appetite, insomnia. Denies HI, AH. Reports VH of moving shadows. She does report a history of seizures while withdrawing from alcohol. She was previously on psychotropic medication but discontinued months ago when the drinking escalated.  Associated Signs/Symptoms: Depression Symptoms:  depressed mood, insomnia, fatigue, decreased appetite, (Hypo) Manic Symptoms:  none Anxiety Symptoms:  Excessive Worry, Psychotic Symptoms:  Hallucinations: Visual moving shadows PTSD Symptoms: Had a traumatic exposure:  boyfriend died from opioid overdose Total Time spent  with patient: 30 minutes  Past Psychiatric History: Long history of alcohol abuse with previous hospital admissions for detox. Previous outpatient treatment for depression. She has not been taking her medications for months.  Is the patient at risk to self? Yes.    Has the patient been a risk to self in the past 6 months? Yes.    Has the patient been a risk to self within the distant past? Yes.    Is the patient a risk to others? No.  Has the patient been a risk to others in the past 6 months? No.  Has the patient been a risk to others within the distant past? No.   Prior Inpatient Therapy:   Prior Outpatient Therapy:    Alcohol Screening: 1. How often do you have a drink containing alcohol?: 4 or more times a week 2. How many drinks containing alcohol do you have on a typical day when you are drinking?: 10 or more 3. How often do you have six or more drinks on one occasion?: Daily or almost daily AUDIT-C Score: 12 4. How often during the last year have you found that you were not able to stop drinking once you had started?: Daily or almost daily 5. How often during the last year have you failed to do what was normally expected from you becasue of drinking?: Daily or almost daily 6. How often during the last year have you needed a first drink in the morning to get yourself going after a heavy drinking session?: Daily or almost daily 7. How often during the last year have you had a feeling of guilt of remorse after drinking?: Daily or almost daily 8. How often during the last year have you been unable to remember what happened  the night before because you had been drinking?: Daily or almost daily 9. Have you or someone else been injured as a result of your drinking?: Yes, during the last year 10. Has a relative or friend or a doctor or another health worker been concerned about your drinking or suggested you cut down?: Yes, during the last year Alcohol Use Disorder Identification Test Final  Score (AUDIT): 40 Intervention/Follow-up: Alcohol Education, Continued Monitoring, Medication Offered/Prescribed Substance Abuse History in the last 12 months:  Yes.   Consequences of Substance Abuse: Medical Consequences:  cirrhosis Family Consequences:  little contact with children Withdrawal Symptoms:   Nausea Tremors Vomiting Previous Psychotropic Medications: Yes  previously prescribed Wellbutrin but stopped taking months ago Psychological Evaluations: No  Past Medical History:  Past Medical History:  Diagnosis Date  . Anxiety   . Bipolar 1 disorder ([place])   . Blood transfusion July 2012  . Bronchitis   . Cirrhosis of liver ([place])   . Depression   . History of sarcoidosis   . Hypothyroidism   . Liver failure ([place])   . Menorrhagia   . Wegener's granulomatosis ([place])     Past Surgical History:  Procedure Laterality Date  . CHOLECYSTECTOMY    . FRACTURE SURGERY    . HERNIA REPAIR    . LAPAROSCOPY    . LAPAROTOMY  07/28/2011   Procedure: EXPLORATORY LAPAROTOMY;  Surgeon: [name], MD;  Location: [facility];  Service: Gynecology;  Laterality: N/A;  . ORTHOPEDIC SURGERY    . TUBAL LIGATION    . VAGINAL HYSTERECTOMY  07/27/2011   Procedure: HYSTERECTOMY VAGINAL;  Surgeon: [name], MD;  Location: [facility];  Service: Gynecology;  Laterality: N/A;  Vaginal Hysterectomy with Right Oophorectomy   Family History:  Family History  Problem Relation Age of Onset  . Diabetes Mother   . Hypertension Mother   . Diabetes Daughter   . Hypertension Daughter    Family Psychiatric  History: Family history of alcoholism, addiction Tobacco Screening: Have you used any form of tobacco in the last 30 days? (Cigarettes, Smokeless Tobacco, Cigars, and/or Pipes): No Social History:  Social History   Substance and Sexual Activity  Alcohol Use Yes   Comment: went 8 months without drinking but drank 2 today to try to feel better     Social History   Substance and Sexual Activity  Drug  Use Yes  . Types: Marijuana, Oxycodone    Additional Social History:      Allergies:   Allergies  Allergen Reactions  . Fluoxetine Hcl Other (See Comments)     hyperactivity  . Metoclopramide Hcl Other (See Comments)    depression  . Morphine And Related Other (See Comments)    High doses cause hallucinations   Lab Results:  Results for orders placed or performed during the hospital encounter of 12/20/18 (from the past 48 hour(s))  Comprehensive metabolic panel     Status: Abnormal   Collection Time: 12/20/18  5:21 PM  Result Value Ref Range   Sodium 139 135 - 145 mmol/L   Potassium 3.1 (L) 3.5 - 5.1 mmol/L   Chloride 105 98 - 111 mmol/L   CO2 22 22 - 32 mmol/L   Glucose, Bld 169 (H) 70 - 99 mg/dL   BUN 15 6 - 20 mg/dL   Creatinine, Ser 0.78 0.44 - 1.00 mg/dL   Calcium 8.8 (L) 8.9 - 10.3 mg/dL   Total Protein 7.4 6.5 - 8.1 g/dL   Albumin 4.4 3.5 -  5.0 g/dL   AST 64 (H) 15 - 41 U/L   ALT 22 0 - 44 U/L   Alkaline Phosphatase 101 38 - 126 U/L   Total Bilirubin 1.0 0.3 - 1.2 mg/dL   GFR calc non Af Amer >60 >60 mL/min   GFR calc Af Amer >60 >60 mL/min   Anion gap 12 5 - 15    Comment: Performed at [facility], [place] [address] [place], [place] [address]  Ethanol     Status: Abnormal   Collection Time: 12/20/18  5:21 PM  Result Value Ref Range   Alcohol, Ethyl (B) 397 (HH) <10 mg/dL    Comment: CRITICAL RESULT CALLED TO, READ BACK BY AND VERIFIED WITH: ZULETA,K RN [phone] COVINGTON,N Performed at [facility], [place] [address], [place], [place] [address]   Salicylate level     Status: None   Collection Time: 12/20/18  5:21 PM  Result Value Ref Range   Salicylate Lvl <6.4 2.8 - 30.0 mg/dL    Comment: Performed at [facility], [place] [address] [place], [place] [address]  Acetaminophen level     Status: Abnormal   Collection Time: 12/20/18  5:21 PM  Result Value Ref Range   Acetaminophen (Tylenol), Serum <10 (L)  10 - 30 ug/mL    Comment: (NOTE) Therapeutic concentrations vary significantly. A range of 10-30 ug/mL  may be an effective concentration for many patients. However, some  are best treated at concentrations outside of this range. Acetaminophen concentrations >150 ug/mL at 4 hours after ingestion  and >50 ug/mL at 12 hours after ingestion are often associated with  toxic reactions. Performed at [facility], [place] [address] [place], [place] [address]   cbc     Status: Abnormal   Collection Time: 12/20/18  5:21 PM  Result Value Ref Range   WBC 3.4 (L) 4.0 - 10.5 K/uL   RBC 3.81 (L) 3.87 - 5.11 MIL/uL   Hemoglobin 12.8 12.0 - 15.0 g/dL   HCT 38.3 36.0 - 46.0 %   MCV 100.5 (H) 80.0 - 100.0 fL   MCH 33.6 26.0 - 34.0 pg   MCHC 33.4 30.0 - 36.0 g/dL   RDW 15.4 11.5 - 15.5 %   Platelets 40 (L) 150 - 400 K/uL    Comment: Immature Platelet Fraction may be clinically indicated, consider ordering this additional test [id number]    nRBC 0.0 0.0 - 0.2 %    Comment: Performed at [facility], [name] [address], [place], [place] [address]  I-Stat beta hCG blood, ED     Status: None   Collection Time: 12/20/18  5:27 PM  Result Value Ref Range   I-stat hCG, quantitative <5.0 <5 mIU/mL   Comment 3            Comment:   GEST. AGE      CONC.  (mIU/mL)   <=1 WEEK        5 - 50     2 WEEKS       50 - 500     3 WEEKS       100 - 10,000     4 WEEKS     1,000 - 30,000        FEMALE AND NON-PREGNANT FEMALE:     LESS THAN 5 mIU/mL   Rapid urine drug screen (hospital performed)     Status: Abnormal   Collection Time: 12/20/18  5:45 PM  Result Value Ref Range   Opiates POSITIVE (A) NONE DETECTED   Cocaine NONE DETECTED NONE DETECTED   Benzodiazepines NONE DETECTED NONE DETECTED   Amphetamines NONE DETECTED NONE DETECTED   Tetrahydrocannabinol NONE DETECTED NONE DETECTED   Barbiturates NONE DETECTED NONE DETECTED    Comment: (NOTE) DRUG SCREEN FOR MEDICAL  PURPOSES ONLY.  IF CONFIRMATION IS NEEDED FOR ANY PURPOSE, NOTIFY LAB WITHIN 5 DAYS. LOWEST DETECTABLE LIMITS FOR URINE DRUG SCREEN Drug Class                     Cutoff (ng/mL) Amphetamine and metabolites    1000 Barbiturate and metabolites    200 Benzodiazepine                 580 Tricyclics and metabolites     300 Opiates and metabolites        300 Cocaine and metabolites        300 THC                            50 Performed at [facility], [place] [address] [place], [place] [address]   TSH     Status: None   Collection Time: 12/20/18  7:26 PM  Result Value Ref Range   TSH 2.670 0.350 - 4.500 uIU/mL    Comment: Performed by a 3rd Generation assay with a functional sensitivity of <=0.01 uIU/mL. Performed at [facility], [place] [address] [place], [place] [address]   Ammonia     Status: Abnormal   Collection Time: 12/20/18  7:26 PM  Result Value Ref Range   Ammonia 50 (H) 9 - 35 umol/L    Comment: Performed at [facility], [place] [address], [place],  [address]    Blood Alcohol level:  Lab Results  Component Value Date   HAL [facility]) 12/20/2018   ETH <10 90/24/0973    Metabolic Disorder Labs:  Lab Results  Component Value Date   HGBA1C 4.8 01/12/2018   MPG 108 01/16/2016   MPG 108 05/04/2014   Lab Results  Component Value Date   PROLACTIN 23.1 01/16/2016   Lab Results  Component Value Date   CHOL 210 (H) 01/12/2018   TRIG 97 01/12/2018   HDL 77 01/12/2018   CHOLHDL 2.7 01/12/2018   VLDL 26 01/16/2016   LDLCALC 114 (H) 01/12/2018   LDLCALC 142 (H) 01/16/2016    Current Medications: Current Facility-Administered Medications  Medication Dose Route Frequency Provider Last Rate Last Dose  . acetaminophen (TYLENOL) tablet 650 mg  650 mg Oral Q6H PRN Patrecia Pour, NP      . alum & mag hydroxide-simeth (MAALOX/MYLANTA) 200-200-20 MG/5ML suspension 30 mL  30 mL Oral Q4H PRN Patrecia Pour, NP       . cloNIDine (CATAPRES) tablet 0.1 mg  0.1 mg Oral QID Sharma Covert, MD       Followed by  . [START ON 12/23/2018] cloNIDine (CATAPRES) tablet 0.1 mg  0.1 mg Oral BH-qamhs Clary, Cordie Grice, MD       Followed by  . [START ON 12/25/2018] cloNIDine (CATAPRES) tablet 0.1 mg  0.1 mg Oral QAC breakfast Sharma Covert, MD      . dicyclomine (BENTYL) tablet 20 mg  20 mg Oral Q6H PRN Sharma Covert, MD      . gabapentin (NEURONTIN) capsule 300 mg  300 mg Oral TID Reita Cliche,  Asa Saunas, NP      . Derrill Memo ON 12/22/2018] hydrochlorothiazide (HYDRODIURIL) tablet 25 mg  25 mg Oral Daily Patrecia Pour, NP      . hydrOXYzine (ATARAX/VISTARIL) tablet 25 mg  25 mg Oral Q6H PRN Sharma Covert, MD      . Derrill Memo ON 12/22/2018] Influenza vac split quadrivalent PF (FLUARIX) injection 0.5 mL  0.5 mL Intramuscular Tomorrow-1000 ,  A, MD      . lactulose (CHRONULAC) 10 GM/15ML solution 20 g  20 g Oral TID Patrecia Pour, NP   20 g at 12/21/18 1258  . [START ON 12/22/2018] levothyroxine (SYNTHROID, LEVOTHROID) tablet 50 mcg  50 mcg Oral QAC breakfast Waylan Boga Y, NP      . loperamide (IMODIUM) capsule 2-4 mg  2-4 mg Oral PRN Sharma Covert, MD      . LORazepam (ATIVAN) tablet 1 mg  1 mg Oral QID Sharma Covert, MD   1 mg at 12/21/18 1257   Followed by  . [START ON 12/22/2018] LORazepam (ATIVAN) tablet 1 mg  1 mg Oral TID Sharma Covert, MD       Followed by  . [START ON 12/23/2018] LORazepam (ATIVAN) tablet 1 mg  1 mg Oral BID Sharma Covert, MD       Followed by  . [START ON 12/24/2018] LORazepam (ATIVAN) tablet 1 mg  1 mg Oral Daily Sharma Covert, MD      . Derrill Memo ON 12/22/2018] losartan (COZAAR) tablet 25 mg  25 mg Oral Daily Lord, Jamison Y, NP      . magnesium hydroxide (MILK OF MAGNESIA) suspension 30 mL  30 mL Oral Daily PRN Patrecia Pour, NP      . methocarbamol (ROBAXIN) tablet 500 mg  500 mg Oral Q8H PRN Sharma Covert, MD      .  mometasone-formoterol [facility]) 200-5 MCG/ACT inhaler 2 puff  2 puff Inhalation BID Patrecia Pour, NP      . multivitamin with minerals tablet 1 tablet  1 tablet Oral Daily Sharma Covert, MD   1 tablet at 12/21/18 1257  . naproxen (NAPROSYN) tablet 500 mg  500 mg Oral BID PRN Sharma Covert, MD      . ondansetron (ZOFRAN-ODT) disintegrating tablet 4 mg  4 mg Oral Q6H PRN Sharma Covert, MD      . Derrill Memo ON 12/22/2018] pneumococcal 23 valent vaccine (PNU-IMMUNE) injection 0.5 mL  0.5 mL Intramuscular Tomorrow-1000 ,  A, MD      . thiamine (B-1) injection 100 mg  100 mg Intramuscular Once Sharma Covert, MD      . Derrill Memo ON 12/22/2018] thiamine (VITAMIN B-1) tablet 100 mg  100 mg Oral Daily Sharma Covert, MD       PTA Medications: Medications Prior to Admission  Medication Sig Dispense Refill Last Dose  . Ascorbic Acid (VITAMIN C) POWD Take 1,000 mg by mouth daily.   Past Month at Unknown time  . atomoxetine (STRATTERA) 100 MG capsule Take 1 capsule by mouth daily.   Past Month at Unknown time  . budesonide-formoterol (SYMBICORT) 160-4.5 MCG/ACT inhaler Inhale 2 puffs into the lungs 2 (two) times daily.   Past Month at Unknown time  . hydrochlorothiazide (HYDRODIURIL) 25 MG tablet Take 1 tablet (25 mg total) by mouth daily. For high blood pressure 90 tablet 1 Past Month at Unknown time  . lactulose (CHRONULAC) 10 GM/15ML solution Take 30 mLs (20 g total)  by mouth 3 (three) times daily. (Patient not taking: Reported on 12/20/2018) 473 mL 1 Not Taking at Unknown time  . levothyroxine (SYNTHROID, LEVOTHROID) 50 MCG tablet Take 50 mcg by mouth daily before breakfast.   Past Month at Unknown time  . losartan (COZAAR) 25 MG tablet Take 25 mg by mouth daily.   Past Month at Unknown time  . montelukast (SINGULAIR) 10 MG tablet Take 1 tablet (10 mg total) by mouth at bedtime. (Patient not taking: Reported on 10/15/2018) 30 tablet 3 Not Taking  . ondansetron (ZOFRAN) 4 MG  tablet Take 1 tablet (4 mg total) by mouth every 8 (eight) hours as needed for nausea or vomiting. (Patient not taking: Reported on 10/15/2018) 30 tablet 1 Not Taking  . prazosin (MINIPRESS) 2 MG capsule Take 1 capsule by mouth at bedtime.  1 Past Month at Unknown time  . thyroid (ARMOUR) 30 MG tablet Take 1 tablet (30 mg total) by mouth daily before breakfast. For low thyroid function (Patient not taking: Reported on 03/07/2018) 90 tablet 1 Not Taking    Musculoskeletal: Strength & Muscle Tone: within normal limits Gait & Station: normal Patient leans: N/A  Psychiatric Specialty Exam: Physical Exam  Nursing note and vitals reviewed. Constitutional: She is oriented to person, place, and time.  Respiratory: Effort normal.  Musculoskeletal:     Comments: tremors  Neurological: She is alert and oriented to person, place, and time.  Skin: She is diaphoretic.    Review of Systems  Gastrointestinal: Positive for nausea and vomiting (in ED).  Psychiatric/Behavioral: Positive for depression, hallucinations (VH of shadows) and substance abuse (ETOH, Percocet). Negative for memory loss and suicidal ideas. The patient is nervous/anxious and has insomnia.     Blood pressure 135/89, pulse 90, temperature 99 F (37.2 C), temperature source Oral, resp. rate 18, height _0  (1.702 m), weight 108.9 kg, SpO2 94 %.Body mass index is 37.59 kg/m.  See MD's admission SRA    Treatment Plan Summary: Daily contact with patient to assess and evaluate symptoms and progress in treatment and Medication management   Recommend for inpatient hospitalization.  Opioid detox: Clonidine COWS protocol  Alcohol detox: Ativan CIWA protocol  Mood: Cymbalta 20 mg PO daily  Anxiety/agitation: Gabapentin 300 mg PO TID Vistaril 25 mg PO Q6HR PRN anxiety  Other medical: Lactulose 20 g PO TID Synthroid 50 mcg PO daily HCTZ 25 mg PO daily Losartan 25 mg daily Dulera inhaler 2 puffs BID K-Dur 20 mEq PO BID x 3  doses  Patient will participate in the therapeutic group milieu.  Discharge disposition in progress.   Observation Level/Precautions:  15 minute checks  Laboratory:  Reviewed from ED  Psychotherapy:  Group therapy  Medications:  See [facility]  Consultations:  PRN  Discharge Concerns:  Safety and stabilization  Estimated LOS: 3-5 days  Other:     Physician Treatment Plan for Primary Diagnosis: Alcohol use disorder, severe, dependence ([place]) Long Term Goal(s): Improvement in symptoms so as ready for discharge  Short Term Goals: Ability to demonstrate self-control will improve, Ability to identify and develop effective coping behaviors will improve and Compliance with prescribed medications will improve  Physician Treatment Plan for Secondary Diagnosis: Principal Problem:   Alcohol use disorder, severe, dependence ([place]) Active Problems:   Major depressive disorder, recurrent severe without psychotic features ([place])  Long Term Goal(s): Improvement in symptoms so as ready for discharge  Short Term Goals: Ability to identify changes in lifestyle to reduce recurrence of condition will improve  and Ability to verbalize feelings will improve  I certify that inpatient services furnished can reasonably be expected to improve the patient's condition.    Connye Burkitt, NP 12/27/20192:07 PM   I have discussed case with NP and have met with patient  Agree with NP note and assessment  52 year old female, lives alone , on disability, has two adult children. Presented to hospital reporting heavy /daily alcohol abuse, depression ,suicidal ideations, without specific plan or intention.Contributing triggers are alcohol, substance abuse, and sadness that her children did not come to visit her for Christmas/ loneliness. Reports neuro-vegetative symptoms including poor sleep, poor appetite, poor energy level. Reports she has been drinking wine and beers every day . She drinks up to a fifth of vodka daily. She  also reports daily opiate analgesic abuse ( such as  Percocet) in varying quantities depending on availability. Admission BAL 397, admission UDS positive for opiates. History of prior psychiatric admission in 2017 for alcohol dependence. Patient reports she has not been taking her prescribed medications for at least several weeks.  Recently prescribed  psychiatric medications- Vyvanse. Medical History is remarkable for HTN,  Liver Cirrhosis, Hypothyroidism, History of Sarcoidosis, Wegener's Granulomatosis. Patient also endorses history of alcohol WDL.  At this time patient presents with some tremulousness, facial flushing. BP 144/98, pulse 103   Dx- Alcohol Dependence, Opiate Abuse. Substance Induced Mood Disorder, Depressed. Alcohol WDL.  Plan- Inpatient admission.  Ativan alcohol detox protocol to address alcohol WDL. Also warrants Clonidine opiate detox protocol to address potential Opiate WDL, as reports frequent , often daily, opiate abuse. Restarted on Lactulose for Cirrhosis , Losartan/HCTZ for HTN , Synthroid for Hypothyroidsm   Started on KDUR for K+ supplementation. Restart Cymbalta at 20 mgrs QDAY initially ( has been on Cymbalta in the past , does not remember having had side effects)

## 2018-12-21 NOTE — Progress Notes (Signed)
Patient ID: Erin [name], female   DOB: Nov 18, 1966, 52 y.o.   MRN: [id number]  Admission Note  D) Patient admitted to the adult unit 300 hall. Patient is a 52 year old female voluntary from [place]. Patient presents with flat affect/depressed mood but was cooperative during the admission process. Patient with evident tremors and sweating and appeared to be withdrawing. Patient reports drinking 1/5 of liquor daily for most of her life and states her last drink was last night. Patient also reports abusing percocet and oxycodone. Patient states she has been living "with a female friend" because she gets lonely around the holidays, but does have her own home with her dog. Patient also reports she stopped all her medications three months ago due to drinking/drugging. Patient with elevated ammonia level, cirrhosis, and Hep B&C. Patient BAL on admission was 347 and her UDS was positive for opiates. Patient currently denies SI/HI/AVH and is unsure if she wants long term treatment for substance due to her dog. Patient does ambulate with generalized weakness but refuses need for walker.  Skin assessment was completed and unremarkable except for bruises to the top of L her food, (pt states due to being rolled over by a wheelchair) and tattoos.    A) Plan of care, unit policies and patient expectations were explained. Written consents obtained. Patient oriented to the unit and their room. Patient placed on standard q15 safety checks. High fall risk precautions initiated and reviewed with patient.Patient belongings searched with no contraband found. Belongings in locker. Vital signs obtained. Snacks and fluids offered. Detox protocol initiated; patient medicated as prescribed.   R) Patient is in no acute distress and verbalizes understanding of information provided. Patient with no concerns at this time. Patient contracts for safety with staff on the unit.

## 2018-12-21 NOTE — ED Notes (Signed)
Attempted to call nursing report to [facility], informed nurse is busy and will call back.

## 2018-12-21 NOTE — [facility] Suicide Risk Assessment (Addendum)
[facility] Admission Suicide Risk Assessment   Nursing information obtained from:   patient and chart Demographic factors:   31, lives alone, on disability Current Mental Status:   see below Loss Factors:   alcohol abuse, disability Historical Factors:   history of alcohol and opiate abuse, history of prior psychiatric admissions  Risk Reduction Factors:   resilience  Total Time spent with patient: 45 minutes Principal Problem:  Alcohol Use Disorder, Opiate Use Disorder,Alcohol Induced Mood Disorder versus MDD  Diagnosis:  Active Problems:   Major depressive disorder, recurrent severe without psychotic features ([place])  Subjective Data:   Continued Clinical Symptoms:    The "Alcohol Use Disorders Identification Test", Guidelines for Use in Primary Care, Second Edition.  World [facility]). Score between 0-7:  no or low risk or alcohol related problems. Score between 8-15:  moderate risk of alcohol related problems. Score between 16-19:  high risk of alcohol related problems. Score 20 or above:  warrants further diagnostic evaluation for alcohol dependence and treatment.   CLINICAL FACTORS:  52 year old female, lives alone , on disability, has two adult children. Presented to hospital reporting heavy /daily alcohol abuse, depression ,suicidal ideations, without specific plan or intention.Contributing triggers are alcohol, substance abuse, and sadness that her children did not come to visit her for Christmas/ loneliness. Reports neuro-vegetative symptoms including poor sleep, poor appetite, poor energy level. Reports she has been drinking wine and beers every day . She drinks up to a fifth of vodka daily. She also reports daily opiate analgesic abuse ( such as  Percocet) in varying quantities depending on availability. Admission BAL 397, admission UDS positive for opiates. History of prior psychiatric admission in 2017 for alcohol dependence. Patient reports she has not been taking  her prescribed medications for at least several weeks.  Recently prescribed  psychiatric medications- Vyvanse. Medical History is remarkable for HTN,  Liver Cirrhosis, Hypothyroidism, History of Sarcoidosis, Wegener's Granulomatosis. Patient also endorses history of alcohol WDL.  At this time patient presents with some tremulousness, facial flushing. BP 144/98, pulse 103   Dx- Alcohol Dependence, Opiate Abuse. Substance Induced Mood Disorder, Depressed. Alcohol WDL.  Plan- Inpatient admission.  Ativan alcohol detox protocol to address alcohol WDL. Also warrants Clonidine opiate detox protocol to address potential Opiate WDL, as reports frequent , often daily, opiate abuse. Restarted on Lactulose for Cirrhosis , Losartan/HCTZ for HTN , Synthroid for Hypothyroidsm   Started on KDUR for K+ supplementation. Restart Cymbalta at 20 mgrs QDAY initially ( has been on Cymbalta in the past , does not remember having had side effects)    Musculoskeletal: Strength & Muscle Tone: some distal tremors,facial flushing Gait & Station: normal Patient leans: N/A  Psychiatric Specialty Exam: Physical Exam  ROS denies chest pain, no shortness of breath, (+) nausea, (+) vomiting yesterday , no fever   Blood pressure (P) 135/89, pulse (P) 90, temperature (P) 99 F (37.2 C), temperature source (P) Oral, resp. rate (P) 18, SpO2 (P) 94 %.There is no height or weight on file to calculate BMI.  General Appearance: Fairly Groomed  Eye Contact:  Good  Speech:  Normal Rate  Volume:  Decreased  Mood:  depressed, anxious  Affect:  congruent, tends to improve during session and smiles appropriately at times during session  Thought Process:  Linear and Descriptions of Associations: Intact  Orientation:  Full (Time, Place, and Person)  Thought Content:  denies hallucinations, no delusions, not internally preoccupied  Suicidal Thoughts:  No today denies  suicidal or self injurious ideations, denies homicidal or  violent ideations  Homicidal Thoughts:  No  Memory:  recent and remote fair  Judgement:  Fair  Insight:  Fair  Psychomotor Activity:  some restlessness, distal tremors  Concentration:  Concentration: Fair and Attention Span: Fair  Recall:  [employer] of Knowledge:  Fair  Language:  Fair  Akathisia:  Negative  Handed:  Right  AIMS (if indicated):     Assets:  Desire for Improvement Resilience  ADL's:  Intact  Cognition:  WNL  Sleep:         COGNITIVE FEATURES THAT CONTRIBUTE TO RISK:  No gross cognitive deficits noted upon discharge. Is alert , attentive, and oriented x 3    SUICIDE RISK:   Moderate:  Frequent suicidal ideation with limited intensity, and duration, some specificity in terms of plans, no associated intent, good self-control, limited dysphoria/symptomatology, some risk factors present, and identifiable protective factors, including available and accessible social support.  PLAN OF CARE: Patient will be admitted to inpatient psychiatric unit for stabilization and safety. Will provide and encourage milieu participation. Provide medication management and maked adjustments as needed.  Will also provide medication management to address alcohol and opiate WDL. Will follow daily.    I certify that inpatient services furnished can reasonably be expected to improve the patient's condition.   Jenne Campus, MD 12/21/2018, 1:49 PM

## 2018-12-22 LAB — HEMOGLOBIN A1C
HEMOGLOBIN A1C: 4.5 % — AB (ref 4.8–5.6)
Mean Plasma Glucose: 82.45 mg/dL

## 2018-12-22 LAB — BASIC METABOLIC PANEL
Anion gap: 12 (ref 5–15)
BUN: 12 mg/dL (ref 6–20)
CO2: 24 mmol/L (ref 22–32)
CREATININE: 0.79 mg/dL (ref 0.44–1.00)
Calcium: 8.6 mg/dL — ABNORMAL LOW (ref 8.9–10.3)
Chloride: 101 mmol/L (ref 98–111)
GFR calc Af Amer: >60 mL/min (ref >60)
Glucose, Bld: 135 mg/dL — ABNORMAL HIGH (ref 70–99)
Potassium: 3.4 mmol/L — ABNORMAL LOW (ref 3.5–5.1)
Sodium: 137 mmol/L (ref 135–145)

## 2018-12-22 MED ORDER — TRAZODONE HCL 100 MG PO TABS
ORAL_TABLET | ORAL | Status: AC
  Filled 2018-12-22: qty 1

## 2018-12-22 MED ORDER — PRAZOSIN HCL 1 MG PO CAPS
1.0000 mg | ORAL_CAPSULE | Freq: Every day | ORAL | Status: DC
  Administered 2018-12-22: 1 mg via ORAL
  Filled 2018-12-22 (×4): qty 1

## 2018-12-22 MED ORDER — TRAZODONE HCL 100 MG PO TABS
100.0000 mg | ORAL_TABLET | Freq: Every evening | ORAL | Status: DC | PRN
  Administered 2018-12-22 – 2018-12-25 (×8): 100 mg via ORAL
  Filled 2018-12-22 (×13): qty 1

## 2018-12-22 MED ORDER — GABAPENTIN 100 MG PO CAPS
200.0000 mg | ORAL_CAPSULE | Freq: Every day | ORAL | Status: DC
  Administered 2018-12-22 – 2018-12-24 (×3): 200 mg via ORAL
  Filled 2018-12-22 (×5): qty 2

## 2018-12-22 NOTE — Progress Notes (Signed)
[facility] Notes:  (Nursing/MHT/Case Management/Adjunct)  Date:  12/22/2018  Time:  1330  Type of Therapy:  Nurse Education  Participation Level: Did not attend.

## 2018-12-22 NOTE — [facility] Group Notes (Signed)
[facility] Notes: (Clinical Social Work)   12/22/2018      Type of Therapy:  Group Therapy   Participation Level:  Did Not Attend despite MHT prompting   Selmer Dominion, LCSW 12/22/2018, 11:09 AM

## 2018-12-22 NOTE — Plan of Care (Signed)
D: Patient presents attention-seeking, somatically preoccupied. She complains of tremors, but they are not evident when she is holding objects. She complains of hallucinations at night, and I encouraged her that the provider had changed her medications for this evening. She also complains of sleep apnea, although she is undiagnosed. She also made bizarre statements that she felt her dog was in her bed with her, which she attributed to hallucinations from withdrawal. Her CIWAs are relatively low and she is on a detox protocol. Patient denies SI/HI/AVH.  A: Patient checked q15 min, and checks reviewed. Reviewed medication changes with patient and educated on side effects. Educated patient on importance of attending group therapy sessions and educated on several coping skills. Encouarged participation in milieu through recreation therapy and attending meals with peers. Support and encouragement provided. Fluids offered. R: Patient receptive to education on medications, and is medication compliant. Patient contracts for safety on the unit. Patient did not complete self inventory.

## 2018-12-22 NOTE — Progress Notes (Addendum)
[facility] MD Progress Note  12/22/2018 1:14 PM Erin [name]  MRN:  [id number] Subjective:  "I feel shaky and anxious."  Ms. Schlee reports high anxiety today, largely related to poor night's sleep. She reports previously taking trazodone on outpatient basis and that it is unhelpful for sleep. Also reports previously taking Seroquel but stopped taking because it made her "feel weird." She thinks the trazodone makes her nightmares worse- reports vivid dreams last night. She found her brother dead years ago and also lost her boyfriend to an opioid overdose last February; she continues to have related nightmares since that time. Reports she was previously diagnosed with PTSD. Today rates depression 9/10 but denies SI/HI/AVH. Appears future oriented and talks about plans to follow up with support groups for substance use.   Ms. Millirons observed to have moderate tremor, flushing, and appears anxious. She also reports some diarrhea and visual disturbances- "seeing spots." Support and encouragement provided.  Principal Problem: Alcohol use disorder, severe, dependence ([place]) Diagnosis: Principal Problem:   Alcohol use disorder, severe, dependence ([place]) Active Problems:   Major depressive disorder, recurrent severe without psychotic features ([place])  Total Time spent with patient: 15 minutes  Past Psychiatric History: See admission H&P.    Past Medical History:  Past Medical History:  Diagnosis Date  . Anxiety   . Bipolar 1 disorder ([place])   . Blood transfusion July 2012  . Bronchitis   . Cirrhosis of liver ([place])   . Depression   . History of sarcoidosis   . Hypothyroidism   . Liver failure ([place])   . Menorrhagia   . Wegener's granulomatosis ([place])     Past Surgical History:  Procedure Laterality Date  . CHOLECYSTECTOMY    . FRACTURE SURGERY    . HERNIA REPAIR    . LAPAROSCOPY    . LAPAROTOMY  07/28/2011   Procedure: EXPLORATORY LAPAROTOMY;  Surgeon: [name], MD;  Location: [facility];   Service: Gynecology;  Laterality: N/A;  . ORTHOPEDIC SURGERY    . TUBAL LIGATION    . VAGINAL HYSTERECTOMY  07/27/2011   Procedure: HYSTERECTOMY VAGINAL;  Surgeon: [name], MD;  Location: [facility];  Service: Gynecology;  Laterality: N/A;  Vaginal Hysterectomy with Right Oophorectomy   Family History:  Family History  Problem Relation Age of Onset  . Diabetes Mother   . Hypertension Mother   . Diabetes Daughter   . Hypertension Daughter    Family Psychiatric  History: See admission H&P Social History:  Social History   Substance and Sexual Activity  Alcohol Use Yes   Comment: went 8 months without drinking but drank 2 today to try to feel better     Social History   Substance and Sexual Activity  Drug Use Yes  . Types: Marijuana, Oxycodone    Social History   Socioeconomic History  . Marital status: Divorced    Spouse name: Not on file  . Number of children: Not on file  . Years of education: Not on file  . Highest education level: Not on file  Occupational History  . Not on file  Social Needs  . Financial resource strain: Not on file  . Food insecurity:    Worry: Not on file    Inability: Not on file  . Transportation needs:    Medical: Not on file    Non-medical: Not on file  Tobacco Use  . Smoking status: Former Smoker    Packs/day: 0.25    Types: Cigarettes  .  Smokeless tobacco: Never Used  Substance and Sexual Activity  . Alcohol use: Yes    Comment: went 8 months without drinking but drank 2 today to try to feel better  . Drug use: Yes    Types: Marijuana, Oxycodone  . Sexual activity: Never  Lifestyle  . Physical activity:    Days per week: Not on file    Minutes per session: Not on file  . Stress: Not on file  Relationships  . Social connections:    Talks on phone: Not on file    Gets together: Not on file    Attends religious service: Not on file    Active member of club or organization: Not on file    Attends meetings of clubs or  organizations: Not on file    Relationship status: Not on file  Other Topics Concern  . Not on file  Social History Narrative  . Not on file   Additional Social History:         Sleep: Poor  Appetite:  Fair  Current Medications: Current Facility-Administered Medications  Medication Dose Route Frequency Provider Last Rate Last Dose  . acetaminophen (TYLENOL) tablet 650 mg  650 mg Oral Q6H PRN Patrecia Pour, NP      . alum & mag hydroxide-simeth (MAALOX/MYLANTA) 200-200-20 MG/5ML suspension 30 mL  30 mL Oral Q4H PRN Patrecia Pour, NP      . cloNIDine (CATAPRES) tablet 0.1 mg  0.1 mg Oral QID Sharma Covert, MD   0.1 mg at 12/22/18 1205   Followed by  . [START ON 12/23/2018] cloNIDine (CATAPRES) tablet 0.1 mg  0.1 mg Oral BH-qamhs Clary, Cordie Grice, MD       Followed by  . [START ON 12/25/2018] cloNIDine (CATAPRES) tablet 0.1 mg  0.1 mg Oral QAC breakfast Sharma Covert, MD      . dicyclomine (BENTYL) tablet 20 mg  20 mg Oral Q6H PRN Sharma Covert, MD      . DULoxetine (CYMBALTA) DR capsule 20 mg  20 mg Oral Daily Cobos, Myer Peer, MD   20 mg at 12/22/18 0175  . gabapentin (NEURONTIN) capsule 100 mg  100 mg Oral BID Cobos, Myer Peer, MD   100 mg at 12/22/18 1025  . hydrochlorothiazide (HYDRODIURIL) tablet 25 mg  25 mg Oral Daily Patrecia Pour, NP   25 mg at 12/22/18 8527  . hydrOXYzine (ATARAX/VISTARIL) tablet 25 mg  25 mg Oral Q6H PRN Sharma Covert, MD   25 mg at 12/22/18 7824  . lactulose (CHRONULAC) 10 GM/15ML solution 20 g  20 g Oral TID Patrecia Pour, NP   20 g at 12/22/18 1205  . levothyroxine (SYNTHROID, LEVOTHROID) tablet 50 mcg  50 mcg Oral QAC breakfast Patrecia Pour, NP   50 mcg at 12/22/18 0615  . loperamide (IMODIUM) capsule 2-4 mg  2-4 mg Oral PRN Sharma Covert, MD      . LORazepam (ATIVAN) tablet 1 mg  1 mg Oral TID Sharma Covert, MD   1 mg at 12/22/18 1204   Followed by  . [START ON 12/23/2018] LORazepam (ATIVAN) tablet 1 mg  1  mg Oral BID Sharma Covert, MD       Followed by  . [START ON 12/24/2018] LORazepam (ATIVAN) tablet 1 mg  1 mg Oral Daily Sharma Covert, MD      . losartan (COZAAR) tablet 25 mg  25 mg Oral Daily Patrecia Pour, NP  25 mg at 12/22/18 1761  . magnesium hydroxide (MILK OF MAGNESIA) suspension 30 mL  30 mL Oral Daily PRN Patrecia Pour, NP      . methocarbamol (ROBAXIN) tablet 500 mg  500 mg Oral Q8H PRN Sharma Covert, MD      . mometasone-formoterol [facility]) 200-5 MCG/ACT inhaler 2 puff  2 puff Inhalation BID Patrecia Pour, NP   2 puff at 12/22/18 [phone]  . multivitamin with minerals tablet 1 tablet  1 tablet Oral Daily Sharma Covert, MD   1 tablet at 12/22/18 7106  . naproxen (NAPROSYN) tablet 500 mg  500 mg Oral BID PRN Sharma Covert, MD      . ondansetron (ZOFRAN-ODT) disintegrating tablet 4 mg  4 mg Oral Q6H PRN Sharma Covert, MD      . potassium chloride SA (K-DUR,KLOR-CON) CR tablet 20 mEq  20 mEq Oral BID Cobos, Myer Peer, MD   20 mEq at 12/22/18 2694  . thiamine (B-1) injection 100 mg  100 mg Intramuscular Once Sharma Covert, MD      . thiamine (VITAMIN B-1) tablet 100 mg  100 mg Oral Daily Sharma Covert, MD   100 mg at 12/22/18 8546  . traZODone (DESYREL) 100 MG tablet           . traZODone (DESYREL) tablet 100 mg  100 mg Oral QHS,MR X 1 Laverle Hobby, PA-C   100 mg at 12/22/18 0201    Lab Results:  Results for orders placed or performed during the hospital encounter of 12/21/18 (from the past 48 hour(s))  Hemoglobin A1c     Status: Abnormal   Collection Time: 12/22/18  6:28 AM  Result Value Ref Range   Hgb A1c MFr Bld 4.5 (L) 4.8 - 5.6 %    Comment: (NOTE) Pre diabetes:          5.7%-6.4% Diabetes:              >6.4% Glycemic control for   <7.0% adults with diabetes    Mean Plasma Glucose 82.45 mg/dL    Comment: Performed at [facility], [place] [address] [place], [place] [address]  Basic metabolic panel     Status: Abnormal    Collection Time: 12/22/18  6:28 AM  Result Value Ref Range   Sodium 137 135 - 145 mmol/L   Potassium 3.4 (L) 3.5 - 5.1 mmol/L   Chloride 101 98 - 111 mmol/L   CO2 24 22 - 32 mmol/L   Glucose, Bld 135 (H) 70 - 99 mg/dL   BUN 12 6 - 20 mg/dL   Creatinine, Ser 0.79 0.44 - 1.00 mg/dL   Calcium 8.6 (L) 8.9 - 10.3 mg/dL   GFR calc non Af Amer >60 >60 mL/min   GFR calc Af Amer >60 >60 mL/min   Anion gap 12 5 - 15    Comment: Performed at [facility], [place] [address] [place], [place] [address]    Blood Alcohol level:  Lab Results  Component Value Date   HWE [facility]) 12/20/2018   ETH <10 71/69/6789    Metabolic Disorder Labs: Lab Results  Component Value Date   HGBA1C 4.5 (L) 12/22/2018   MPG 82.45 12/22/2018   MPG 108 01/16/2016   Lab Results  Component Value Date   PROLACTIN 23.1 01/16/2016   Lab Results  Component Value Date   CHOL 210 (H) 01/12/2018   TRIG 97 01/12/2018   HDL  77 01/12/2018   CHOLHDL 2.7 01/12/2018   VLDL 26 01/16/2016   LDLCALC 114 (H) 01/12/2018   LDLCALC 142 (H) 01/16/2016    Physical Findings: AIMS: Facial and Oral Movements Muscles of Facial Expression: None, normal Lips and Perioral Area: None, normal Jaw: None, normal Tongue: None, normal,Extremity Movements Upper (arms, wrists, hands, fingers): None, normal Lower (legs, knees, ankles, toes): None, normal, Trunk Movements Neck, shoulders, hips: None, normal, Overall Severity Severity of abnormal movements (highest score from questions above): None, normal Incapacitation due to abnormal movements: None, normal Patient's awareness of abnormal movements (rate only patient's report): No Awareness, Dental Status Current problems with teeth and/or dentures?: No Does patient usually wear dentures?: No  CIWA:  CIWA-Ar Total: 6 COWS:  COWS Total Score: 5  Musculoskeletal: Strength & Muscle Tone: within normal limits Gait & Station: normal Patient leans: N/A  Psychiatric  Specialty Exam: Physical Exam  Nursing note and vitals reviewed. Constitutional: She is oriented to person, place, and time.  HENT:  Head: Normocephalic.  Respiratory: Effort normal.  Musculoskeletal: Normal range of motion.  Neurological: She is alert and oriented to person, place, and time.    Review of Systems  Constitutional: Positive for malaise/fatigue.  Gastrointestinal: Positive for diarrhea.  Musculoskeletal: Positive for myalgias.  Neurological: Positive for tremors.  Psychiatric/Behavioral: Positive for depression and substance abuse (ETOH and opioid detox). Negative for hallucinations, memory loss and suicidal ideas. The patient is nervous/anxious and has insomnia.     Blood pressure (!) 129/102, pulse 98, temperature 98 F (36.7 C), temperature source Oral, resp. rate 20, height 5\' 7"  (1.702 m), weight 108.9 kg, SpO2 94 %.Body mass index is 37.59 kg/m.  General Appearance: Disheveled  Eye Contact:  Good  Speech:  Clear and Coherent  Volume:  Normal  Mood:  Anxious and Depressed  Affect:  Congruent  Thought Process:  Coherent  Orientation:  Full (Time, Place, and Person)  Thought Content:  WDL  Suicidal Thoughts:  No  Homicidal Thoughts:  No  Memory:  Immediate;   Good  Judgement:  Fair  Insight:  Fair  Psychomotor Activity:  Normal  Concentration:  Concentration: Good  Recall:  Good  Fund of Knowledge:  Good  Language:  Good  Akathisia:  No  Handed:  Right  AIMS (if indicated):     Assets:  Communication Skills Desire for Improvement Housing  ADL's:  Intact  Cognition:  WNL  Sleep:  Number of Hours: 5.25     Treatment Plan Summary: Daily contact with patient to assess and evaluate symptoms and progress in treatment and Medication management  Continue inpatient hospitalization.  ETOH detox: Continue Ativan CIWA protocol  Opioid detox: Continue clonidine COWS protocol  Mood: Continue Cymbalta 20 mg PO daily  Anxiety/agitation: Continue  gabapentin 100 mg PO BID Continue Vistaril 25 mg PO Q6HR PRN anxiety  Nightmares/insomnia: Start prazosin 1 mg PO QHS Add gabapentin 200 mg dose PO QHS  Other medical: Continue Lactulose 20 g PO TID Continue Synthroid 50 mcg PO daily Continue HCTZ 25 mg PO daily Continue Losartan 25 mg daily Continue Dulera inhaler 2 puffs BID Continue K-Dur 20 mEq PO BID x 3 doses  Patient will participate in the therapeutic group milieu.  Discharge disposition in progress.   Connye Burkitt, NP 12/22/2018, 1:14 PM   ..Agree with NP Progress Note

## 2018-12-22 NOTE — Progress Notes (Signed)
Patient has been isolative to her room and did not feel up to attending AA tonight. Writer informed her of medications scheduled. She reports that she has been having bad nightmares that have been affecting her sleep. She reports that trazodone does not help but decides to take it along with her other medications tonight to see if it will help her rest. Support given along with a pitcher of water for hydration. Safety maintained with 15 min checks.

## 2018-12-23 MED ORDER — PRAZOSIN HCL 2 MG PO CAPS
2.0000 mg | ORAL_CAPSULE | Freq: Every day | ORAL | Status: DC
  Administered 2018-12-23: 2 mg via ORAL
  Filled 2018-12-23: qty 1
  Filled 2018-12-23: qty 2
  Filled 2018-12-23: qty 1

## 2018-12-23 MED ORDER — DULOXETINE HCL 30 MG PO CPEP
30.0000 mg | ORAL_CAPSULE | Freq: Every day | ORAL | Status: DC
  Administered 2018-12-24: 30 mg via ORAL
  Filled 2018-12-23 (×2): qty 1

## 2018-12-23 NOTE — Progress Notes (Addendum)
[facility] MD Progress Note  12/23/2018 2:30 PM Erin [name]  MRN:  [id number] Subjective:  "I'm scared to lay down and sleep because of the nightmares."  Erin [name] found resting in her room. Patient presents with anxious affect, reporting a restless night with nightmares. Reports waking up disoriented to place several times overnight (forgot she was in hospital). Also reports waking up overnight with VH and AH of a television in her room, and tactile hallucinations of her dog moving around in the bed with her. Alert and oriented x 3 this morning and denies daytime hallucinations. Denies withdrawal symptoms this AM. Per chart review, last CIWA was 4 and COWS 3.  Rates depression 9/10 today, with 10 being most severe depression. Denies SI/HI. Ruminates on nightmares. Reports reduced appetite, fatigue. She additionally reports history of sleep apnea but does not have a CPAP. Patient was encouraged to follow up with outpatient medical provider regarding sleep apnea as this problem would disrupt sleep when untreated. She stated understanding. Support and encouragement provided.  From admission H&P: 52 year old female with history of alcohol, opioid, and cocaine abuse, depression, hypothyroidism, cirrhosis, and sarcoidosis presenting for treatment of depression in the context of opioid and alcohol abuse. Per chart review she endorsed suicidal ideation in the ED. Currently denies SI but states she needs help for her alcohol and opioid abuse. Her mother and stepfather both passed away recently, and her boyfriend died from an opioid overdose. She has increased her drinking to cope with these deaths. Reports drinking a fifth a day for the last 8 months and sometimes wine and beer, in bars and at home. She also reports daily use of opioids. Admission UDS positive for opioids, BAL 397. Last use of both alcohol and opioids was last night. She states she lives by herself and feels isolated, does not have contact with her  four adult children. Going to bars is her main way of socializing with other people. Reports low energy, appetite, insomnia. Denies HI, AH. Reports VH of moving shadows. She does report a history of seizures while withdrawing from alcohol. She was previously on psychotropic medication but discontinued months ago when the drinking escalated.  Principal Problem: Alcohol use disorder, severe, dependence ([place]) Diagnosis: Principal Problem:   Alcohol use disorder, severe, dependence ([place]) Active Problems:   Major depressive disorder, recurrent severe without psychotic features ([place])  Total Time spent with patient: 20 minutes  Past Psychiatric History: See admission H&P  Past Medical History:  Past Medical History:  Diagnosis Date  . Anxiety   . Bipolar 1 disorder ([place])   . Blood transfusion July 2012  . Bronchitis   . Cirrhosis of liver ([place])   . Depression   . History of sarcoidosis   . Hypothyroidism   . Liver failure ([place])   . Menorrhagia   . Wegener's granulomatosis ([place])     Past Surgical History:  Procedure Laterality Date  . CHOLECYSTECTOMY    . FRACTURE SURGERY    . HERNIA REPAIR    . LAPAROSCOPY    . LAPAROTOMY  07/28/2011   Procedure: EXPLORATORY LAPAROTOMY;  Surgeon: [name], MD;  Location: [facility];  Service: Gynecology;  Laterality: N/A;  . ORTHOPEDIC SURGERY    . TUBAL LIGATION    . VAGINAL HYSTERECTOMY  07/27/2011   Procedure: HYSTERECTOMY VAGINAL;  Surgeon: [name], MD;  Location: [facility];  Service: Gynecology;  Laterality: N/A;  Vaginal Hysterectomy with Right Oophorectomy   Family History:  Family History  Problem  Relation Age of Onset  . Diabetes Mother   . Hypertension Mother   . Diabetes Daughter   . Hypertension Daughter    Family Psychiatric  History: See admission H&P Social History:  Social History   Substance and Sexual Activity  Alcohol Use Yes   Comment: went 8 months without drinking but drank 2 today to try to feel better      Social History   Substance and Sexual Activity  Drug Use Yes  . Types: Marijuana, Oxycodone    Social History   Socioeconomic History  . Marital status: Divorced    Spouse name: Not on file  . Number of children: Not on file  . Years of education: Not on file  . Highest education level: Not on file  Occupational History  . Not on file  Social Needs  . Financial resource strain: Not on file  . Food insecurity:    Worry: Not on file    Inability: Not on file  . Transportation needs:    Medical: Not on file    Non-medical: Not on file  Tobacco Use  . Smoking status: Former Smoker    Packs/day: 0.25    Types: Cigarettes  . Smokeless tobacco: Never Used  Substance and Sexual Activity  . Alcohol use: Yes    Comment: went 8 months without drinking but drank 2 today to try to feel better  . Drug use: Yes    Types: Marijuana, Oxycodone  . Sexual activity: Never  Lifestyle  . Physical activity:    Days per week: Not on file    Minutes per session: Not on file  . Stress: Not on file  Relationships  . Social connections:    Talks on phone: Not on file    Gets together: Not on file    Attends religious service: Not on file    Active member of club or organization: Not on file    Attends meetings of clubs or organizations: Not on file    Relationship status: Not on file  Other Topics Concern  . Not on file  Social History Narrative  . Not on file   Additional Social History:     Sleep: Poor  Appetite:  Poor  Current Medications: Current Facility-Administered Medications  Medication Dose Route Frequency Provider Last Rate Last Dose  . acetaminophen (TYLENOL) tablet 650 mg  650 mg Oral Q6H PRN Patrecia Pour, NP      . alum & mag hydroxide-simeth (MAALOX/MYLANTA) 200-200-20 MG/5ML suspension 30 mL  30 mL Oral Q4H PRN Patrecia Pour, NP      . cloNIDine (CATAPRES) tablet 0.1 mg  0.1 mg Oral BH-qamhs Sharma Covert, MD   0.1 mg at 12/23/18 0913   Followed by   . [START ON 12/25/2018] cloNIDine (CATAPRES) tablet 0.1 mg  0.1 mg Oral QAC breakfast Sharma Covert, MD      . dicyclomine (BENTYL) tablet 20 mg  20 mg Oral Q6H PRN Sharma Covert, MD      . DULoxetine (CYMBALTA) DR capsule 20 mg  20 mg Oral Daily Cobos, Myer Peer, MD   20 mg at 12/23/18 0913  . gabapentin (NEURONTIN) capsule 100 mg  100 mg Oral BID Cobos, Myer Peer, MD   100 mg at 12/23/18 0912  . gabapentin (NEURONTIN) capsule 200 mg  200 mg Oral QHS Connye Burkitt, NP   200 mg at 12/22/18 2122  . hydrochlorothiazide (HYDRODIURIL) tablet 25 mg  25 mg  Oral Daily Patrecia Pour, NP   25 mg at 12/23/18 0354  . hydrOXYzine (ATARAX/VISTARIL) tablet 25 mg  25 mg Oral Q6H PRN Sharma Covert, MD   25 mg at 12/23/18 1303  . lactulose (CHRONULAC) 10 GM/15ML solution 20 g  20 g Oral TID Patrecia Pour, NP   20 g at 12/23/18 1209  . levothyroxine (SYNTHROID, LEVOTHROID) tablet 50 mcg  50 mcg Oral QAC breakfast Patrecia Pour, NP   50 mcg at 12/23/18 [phone]  . loperamide (IMODIUM) capsule 2-4 mg  2-4 mg Oral PRN Sharma Covert, MD      . LORazepam (ATIVAN) tablet 1 mg  1 mg Oral BID Sharma Covert, MD   1 mg at 12/23/18 0916   Followed by  . [START ON 12/24/2018] LORazepam (ATIVAN) tablet 1 mg  1 mg Oral Daily Sharma Covert, MD      . losartan (COZAAR) tablet 25 mg  25 mg Oral Daily Patrecia Pour, NP   25 mg at 12/23/18 0913  . magnesium hydroxide (MILK OF MAGNESIA) suspension 30 mL  30 mL Oral Daily PRN Patrecia Pour, NP      . methocarbamol (ROBAXIN) tablet 500 mg  500 mg Oral Q8H PRN Sharma Covert, MD      . mometasone-formoterol [facility]) 200-5 MCG/ACT inhaler 2 puff  2 puff Inhalation BID Patrecia Pour, NP   2 puff at 12/23/18 0914  . multivitamin with minerals tablet 1 tablet  1 tablet Oral Daily Sharma Covert, MD   1 tablet at 12/23/18 0913  . naproxen (NAPROSYN) tablet 500 mg  500 mg Oral BID PRN Sharma Covert, MD      . ondansetron (ZOFRAN-ODT)  disintegrating tablet 4 mg  4 mg Oral Q6H PRN Sharma Covert, MD      . prazosin (MINIPRESS) capsule 1 mg  1 mg Oral QHS Connye Burkitt, NP   1 mg at 12/22/18 2122  . thiamine (B-1) injection 100 mg  100 mg Intramuscular Once Sharma Covert, MD      . thiamine (VITAMIN B-1) tablet 100 mg  100 mg Oral Daily Sharma Covert, MD   100 mg at 12/23/18 0913  . traZODone (DESYREL) tablet 100 mg  100 mg Oral QHS,MR X 1 Laverle Hobby, PA-C   100 mg at 12/22/18 2207    Lab Results:  Results for orders placed or performed during the hospital encounter of 12/21/18 (from the past 48 hour(s))  Hemoglobin A1c     Status: Abnormal   Collection Time: 12/22/18  6:28 AM  Result Value Ref Range   Hgb A1c MFr Bld 4.5 (L) 4.8 - 5.6 %    Comment: (NOTE) Pre diabetes:          5.7%-6.4% Diabetes:              >6.4% Glycemic control for   <7.0% adults with diabetes    Mean Plasma Glucose 82.45 mg/dL    Comment: Performed at [facility], [place] [address], [place], [place] [address]  Basic metabolic panel     Status: Abnormal   Collection Time: 12/22/18  6:28 AM  Result Value Ref Range   Sodium 137 135 - 145 mmol/L   Potassium 3.4 (L) 3.5 - 5.1 mmol/L   Chloride 101 98 - 111 mmol/L   CO2 24 22 - 32 mmol/L   Glucose, Bld 135 (H) 70 - 99 mg/dL  BUN 12 6 - 20 mg/dL   Creatinine, Ser 0.79 0.44 - 1.00 mg/dL   Calcium 8.6 (L) 8.9 - 10.3 mg/dL   GFR calc non Af Amer >60 >60 mL/min   GFR calc Af Amer >60 >60 mL/min   Anion gap 12 5 - 15    Comment: Performed at [facility], [place] [address], [place], [place] [address]    Blood Alcohol level:  Lab Results  Component Value Date   ETH 397 [facility]) 12/20/2018   ETH <10 22/29/7989    Metabolic Disorder Labs: Lab Results  Component Value Date   HGBA1C 4.5 (L) 12/22/2018   MPG 82.45 12/22/2018   MPG 108 01/16/2016   Lab Results  Component Value Date   PROLACTIN 23.1 01/16/2016   Lab Results  Component Value Date    CHOL 210 (H) 01/12/2018   TRIG 97 01/12/2018   HDL 77 01/12/2018   CHOLHDL 2.7 01/12/2018   VLDL 26 01/16/2016   LDLCALC 114 (H) 01/12/2018   LDLCALC 142 (H) 01/16/2016    Physical Findings: AIMS: Facial and Oral Movements Muscles of Facial Expression: None, normal Lips and Perioral Area: None, normal Jaw: None, normal Tongue: None, normal,Extremity Movements Upper (arms, wrists, hands, fingers): None, normal Lower (legs, knees, ankles, toes): None, normal, Trunk Movements Neck, shoulders, hips: None, normal, Overall Severity Severity of abnormal movements (highest score from questions above): None, normal Incapacitation due to abnormal movements: None, normal Patient's awareness of abnormal movements (rate only patient's report): No Awareness, Dental Status Current problems with teeth and/or dentures?: No Does patient usually wear dentures?: No  CIWA:  CIWA-Ar Total: 4 COWS:  COWS Total Score: 3  Musculoskeletal: Strength & Muscle Tone: within normal limits Gait & Station: normal Patient leans: N/A  Psychiatric Specialty Exam: Physical Exam  Nursing note and vitals reviewed. Constitutional: She is oriented to person, place, and time. She appears well-developed and well-nourished.  Cardiovascular: Normal rate.  Respiratory: Effort normal.  Neurological: She is alert and oriented to person, place, and time.    Review of Systems  Constitutional: Negative.   Respiratory: Negative.   Cardiovascular: Negative.   Gastrointestinal: Negative.   Neurological: Negative.   Psychiatric/Behavioral: Positive for depression, hallucinations (overnight only) and substance abuse (ETOH, opioid detox). Negative for memory loss and suicidal ideas. The patient is nervous/anxious and has insomnia.     Blood pressure (!) 130/98, pulse (!) 104, temperature 98 F (36.7 C), temperature source Oral, resp. rate 20, height 5\' 7"  (1.702 m), weight 108.9 kg, SpO2 94 %.Body mass index is 37.59 kg/m.   General Appearance: Fairly Groomed  Eye Contact:  Good  Speech:  Clear and Coherent  Volume:  Normal  Mood:  Anxious  Affect:  Congruent  Thought Process:  Coherent  Orientation:  Full (Time, Place, and Person)  Thought Content:  WDL  Suicidal Thoughts:  No  Homicidal Thoughts:  No  Memory:  Immediate;   Good  Judgement:  Fair  Insight:  Fair  Psychomotor Activity:  Normal  Concentration:  Concentration: Good  Recall:  Good  Fund of Knowledge:  Good  Language:  Good  Akathisia:  No  Handed:  Right  AIMS (if indicated):     Assets:  Communication Skills Desire for Improvement Housing  ADL's:  Intact  Cognition:  WNL  Sleep:  Number of Hours: 6.5     Treatment Plan Summary: Daily contact with patient to assess and evaluate symptoms and progress in treatment and Medication management  Continue inpatient hospitalization.  ETOH detox: Continue Ativan CIWA protocol  Opioid detox: Continue clonidine COWS protocol  Mood: Increase Cymbalta to 30 mg PO daily  Anxiety/agitation: Continue gabapentin 100 mg PO BID Continue Vistaril 25 mg PO Q6HR PRN anxiety  Nightmares/insomnia: Increase prazosin to 2 mg PO QHS Continue gabapentin 200 mg dose PO QHS  Other medical: Continue Lactulose 20 g PO TID Continue Synthroid 50 mcg PO daily Continue HCTZ 25 mg PO daily Continue Losartan 25 mg PO daily Continue Dulera inhaler 2 puffs BID  Patient will participate in the therapeutic group milieu.  Discharge disposition in progress  Connye Burkitt, NP 12/23/2018, 2:30 PM   ..Agree with NP Progress Note

## 2018-12-23 NOTE — [facility] Group Notes (Signed)
[place] LCSW Group Therapy Note  12/23/2018  10:00-11:00AM  Type of Therapy and Topic:  Group Therapy:  Adding Supports Including Being Your Own Support  Participation Level:  Active   Description of Group:  Patients in this group were introduced to the concept that additional supports including self-support are an essential part of recovery.  A song entitled "I Need Help!" was played and a group discussion was held in reaction to the idea of needing to add supports.  A song entitled "My Own Hero" was played and a group discussion ensued in which patients stated they could relate to the song and it inspired them to realize they have be willing to help themselves in order to succeed, because other people cannot achieve sobriety or stability for them.  We discussed adding a variety of healthy supports to address the various needs in their lives.  A song was played called "I Know Where I've Been" toward the end of group and used to conduct an inspirational wrap-up to group of remembering how far they have already come in their journey.  Therapeutic Goals: 1)  demonstrate the importance of being a part of one's own support system 2)  discuss reasons people in one's life may eventually be unable to be continually supportive  3)  identify the patient's current support system and   4)  elicit commitments to add healthy supports and to become more conscious of being self-supportive   Summary of Patient Progress:  The patient expressed that her only support is her dog and he is healthy for her and probably saved her life.   She talked at length about the deaths of her mother and boyfriend "who got me hooked on oxy's."  She stated that she needs to let people in and be honest with them about what is going on.  She had lied to sister on the phone even while she was drinking, and has since told her sister this.  She agreed to reach out to her sister again tonight.   Therapeutic Modalities:   Motivational  Interviewing Activity  Maretta Los

## 2018-12-23 NOTE — Progress Notes (Signed)
D Pt is observed OOB UAL on the 300 hall today- she tolerates this fairly well. HEr ambulation is slow- but steady. She is flat, depressed, but alert and aware of her surroundings. SHe is able to articulate that she was " in a dark place when I came in here". She wears her own clothes- she is clean and her clothing is appropriate. She is attending her groups and eating her meals in the cafe'- per POC.     A SHe attended her Life SKills group today and did a good job with sharing her personal feelings in the group. SHe wants to continue to get healthier and is actively talking about her unhealthier behaviors and is able to process ways she can begin to try out  newer healthier behaviors and let go of others  .     R Safety is in place and ativan helping pt with withdrawal .

## 2018-12-23 NOTE — [facility] Group Notes (Signed)
[place] Group Notes:  (Nursing/MHT/Case Management/Adjunct)  Date:  12/23/2018  Time:  5:34 PM  Type of Therapy:  Nurse Education  Participation Level:  Active  Participation Quality:  Appropriate and Attentive  Affect:  Appropriate  Cognitive:  Alert and Appropriate  Insight:  Appropriate and Good  Engagement in Group:  Engaged  Modes of Intervention:  Activity and Discussion  Summary of Progress/Problems: In this group, patients were educated on how to express their needs to others in a healthy way. They explored unhealthy communication and new methods to manage stressful relationship situations. Patients discussed stress management skills and how to incorporate them into their daily life. The patient was active and participated readily.   Lesli Albee 12/23/2018, 5:34 PM

## 2018-12-23 NOTE — Progress Notes (Signed)
Patient did not attend the evening speaker [place] meeting. Pt was notified that group was beginning but remained in her room reporting having been up all day.

## 2018-12-23 NOTE — Plan of Care (Signed)
  Problem: Education: Goal: Knowledge of  General Education information/materials will improve Outcome: Progressing   

## 2018-12-24 LAB — AMMONIA: Ammonia: 28 umol/L (ref 9–35)

## 2018-12-24 MED ORDER — PRAZOSIN HCL 2 MG PO CAPS
4.0000 mg | ORAL_CAPSULE | Freq: Every day | ORAL | Status: DC
  Administered 2018-12-24 – 2018-12-25 (×2): 4 mg via ORAL
  Filled 2018-12-24 (×3): qty 2

## 2018-12-24 MED ORDER — DULOXETINE HCL 60 MG PO CPEP
60.0000 mg | ORAL_CAPSULE | Freq: Every day | ORAL | Status: DC
  Administered 2018-12-25 – 2018-12-26 (×2): 60 mg via ORAL
  Filled 2018-12-24 (×3): qty 1

## 2018-12-24 NOTE — Progress Notes (Signed)
Adult Psychoeducational Group Note  Date:  12/24/2018 Time:  10:48 AM  Group Topic/Focus:  Goals Group:   The focus of this group is to help patients establish daily goals to achieve during treatment and discuss how the patient can incorporate goal setting into their daily lives to aide in recovery.  Participation Level:  Active  Participation Quality:  Appropriate and Attentive  Affect:  Irritable, Labile and Tearful  Cognitive:  Alert  Insight: Appropriate  Engagement in Group:  Engaged  Modes of Intervention:  Activity, Clarification, Discussion, Education and Support  Additional Comments:  Pt shared that she was upset because she could not see her doctor.  She became loud during the group and was able to be de-escalated by this staff.  Pt shared that she wanted to get back to her dog but was having nightmares, spasms, and was unclear as to what the discharge plans were unclear due to her outpatient therapy.  Pt was assured her nurse would be told pt's upset.  Pt calmed down and reported that she was drinking 1/5 of liquor and taking oxycotin and percocet regulary.  Pt shared that her dog had saved her from overdose x2.  Pt denied S/I and H/I.  Versie Starks  MHT/LRT/CTRS 12/24/2018, 10:48 AM

## 2018-12-24 NOTE — Tx Team (Signed)
Interdisciplinary Treatment and Diagnostic Plan Update  12/24/2018 Time of Session: [place] MRN: [id number]  Principal Diagnosis: Alcohol use disorder, severe, dependence ([place])  Secondary Diagnoses: Principal Problem:   Alcohol use disorder, severe, dependence ([place]) Active Problems:   Major depressive disorder, recurrent severe without psychotic features ([place])   Current Medications:  Current Facility-Administered Medications  Medication Dose Route Frequency Provider Last Rate Last Dose  . acetaminophen (TYLENOL) tablet 650 mg  650 mg Oral Q6H PRN Patrecia Pour, NP      . alum & mag hydroxide-simeth (MAALOX/MYLANTA) 200-200-20 MG/5ML suspension 30 mL  30 mL Oral Q4H PRN Patrecia Pour, NP      . cloNIDine (CATAPRES) tablet 0.1 mg  0.1 mg Oral BH-qamhs Sharma Covert, MD   0.1 mg at 12/24/18 0750   Followed by  . [START ON 12/25/2018] cloNIDine (CATAPRES) tablet 0.1 mg  0.1 mg Oral QAC breakfast Sharma Covert, MD      . dicyclomine (BENTYL) tablet 20 mg  20 mg Oral Q6H PRN Sharma Covert, MD      . DULoxetine (CYMBALTA) DR capsule 30 mg  30 mg Oral Daily Connye Burkitt, NP   30 mg at 12/24/18 0756  . gabapentin (NEURONTIN) capsule 100 mg  100 mg Oral BID Cobos, Myer Peer, MD   100 mg at 12/24/18 0750  . gabapentin (NEURONTIN) capsule 200 mg  200 mg Oral QHS Connye Burkitt, NP   200 mg at 12/23/18 2117  . hydrochlorothiazide (HYDRODIURIL) tablet 25 mg  25 mg Oral Daily Patrecia Pour, NP   25 mg at 12/24/18 0750  . hydrOXYzine (ATARAX/VISTARIL) tablet 25 mg  25 mg Oral Q6H PRN Sharma Covert, MD   25 mg at 12/23/18 2117  . lactulose (CHRONULAC) 10 GM/15ML solution 20 g  20 g Oral TID Patrecia Pour, NP   20 g at 12/24/18 0751  . levothyroxine (SYNTHROID, LEVOTHROID) tablet 50 mcg  50 mcg Oral QAC breakfast Patrecia Pour, NP   50 mcg at 12/24/18 0617  . loperamide (IMODIUM) capsule 2-4 mg  2-4 mg Oral PRN Sharma Covert, MD      . losartan  (COZAAR) tablet 25 mg  25 mg Oral Daily Patrecia Pour, NP   25 mg at 12/24/18 0751  . magnesium hydroxide (MILK OF MAGNESIA) suspension 30 mL  30 mL Oral Daily PRN Patrecia Pour, NP      . methocarbamol (ROBAXIN) tablet 500 mg  500 mg Oral Q8H PRN Sharma Covert, MD   500 mg at 12/24/18 0802  . mometasone-formoterol (DULERA) 200-5 MCG/ACT inhaler 2 puff  2 puff Inhalation BID Patrecia Pour, NP   2 puff at 12/24/18 0751  . multivitamin with minerals tablet 1 tablet  1 tablet Oral Daily Sharma Covert, MD   1 tablet at 12/24/18 7408  . naproxen (NAPROSYN) tablet 500 mg  500 mg Oral BID PRN Sharma Covert, MD      . ondansetron (ZOFRAN-ODT) disintegrating tablet 4 mg  4 mg Oral Q6H PRN Sharma Covert, MD      . prazosin (MINIPRESS) capsule 2 mg  2 mg Oral QHS Connye Burkitt, NP   2 mg at 12/23/18 2117  . thiamine (B-1) injection 100 mg  100 mg Intramuscular Once Sharma Covert, MD      . thiamine (VITAMIN B-1) tablet 100 mg  100 mg Oral Daily Mallie Darting Cordie Grice, MD  100 mg at 12/24/18 0752  . traZODone (DESYREL) tablet 100 mg  100 mg Oral QHS,MR X 1 Laverle Hobby, PA-C   100 mg at 12/23/18 2117   PTA Medications: Medications Prior to Admission  Medication Sig Dispense Refill Last Dose  . Ascorbic Acid (VITAMIN C) POWD Take 1,000 mg by mouth daily.   Past Month at Unknown time  . atomoxetine (STRATTERA) 100 MG capsule Take 1 capsule by mouth daily.   Past Month at Unknown time  . budesonide-formoterol (SYMBICORT) 160-4.5 MCG/ACT inhaler Inhale 2 puffs into the lungs 2 (two) times daily.   Past Month at Unknown time  . hydrochlorothiazide (HYDRODIURIL) 25 MG tablet Take 1 tablet (25 mg total) by mouth daily. For high blood pressure 90 tablet 1 Past Month at Unknown time  . lactulose (CHRONULAC) 10 GM/15ML solution Take 30 mLs (20 g total) by mouth 3 (three) times daily. (Patient not taking: Reported on 12/20/2018) 473 mL 1 Not Taking at Unknown time  . levothyroxine  (SYNTHROID, LEVOTHROID) 50 MCG tablet Take 50 mcg by mouth daily before breakfast.   Past Month at Unknown time  . losartan (COZAAR) 25 MG tablet Take 25 mg by mouth daily.   Past Month at Unknown time  . montelukast (SINGULAIR) 10 MG tablet Take 1 tablet (10 mg total) by mouth at bedtime. (Patient not taking: Reported on 10/15/2018) 30 tablet 3 Not Taking  . ondansetron (ZOFRAN) 4 MG tablet Take 1 tablet (4 mg total) by mouth every 8 (eight) hours as needed for nausea or vomiting. (Patient not taking: Reported on 10/15/2018) 30 tablet 1 Not Taking  . prazosin (MINIPRESS) 2 MG capsule Take 1 capsule by mouth at bedtime.  1 Past Month at Unknown time  . thyroid (ARMOUR) 30 MG tablet Take 1 tablet (30 mg total) by mouth daily before breakfast. For low thyroid function (Patient not taking: Reported on 03/07/2018) 90 tablet 1 Not Taking    Patient Stressors: Medication change or noncompliance Substance abuse  Patient Strengths: Ability for insight Average or above average intelligence Financial means Motivation for treatment/growth  Treatment Modalities: Medication Management, Group therapy, Case management,  1 to 1 session with clinician, Psychoeducation, Recreational therapy.   Physician Treatment Plan for Primary Diagnosis: Alcohol use disorder, severe, dependence ([place]) Long Term Goal(s): Improvement in symptoms so as ready for discharge Improvement in symptoms so as ready for discharge   Short Term Goals: Ability to demonstrate self-control will improve Ability to identify and develop effective coping behaviors will improve Compliance with prescribed medications will improve Ability to identify changes in lifestyle to reduce recurrence of condition will improve Ability to verbalize feelings will improve  Medication Management: Evaluate patient's response, side effects, and tolerance of medication regimen.  Therapeutic Interventions: 1 to 1 sessions, Unit Group sessions and Medication  administration.  Evaluation of Outcomes: Progressing  Physician Treatment Plan for Secondary Diagnosis: Principal Problem:   Alcohol use disorder, severe, dependence ([place]) Active Problems:   Major depressive disorder, recurrent severe without psychotic features ([place])  Long Term Goal(s): Improvement in symptoms so as ready for discharge Improvement in symptoms so as ready for discharge   Short Term Goals: Ability to demonstrate self-control will improve Ability to identify and develop effective coping behaviors will improve Compliance with prescribed medications will improve Ability to identify changes in lifestyle to reduce recurrence of condition will improve Ability to verbalize feelings will improve     Medication Management: Evaluate patient's response, side effects, and tolerance of medication  regimen.  Therapeutic Interventions: 1 to 1 sessions, Unit Group sessions and Medication administration.  Evaluation of Outcomes: Progressing   RN Treatment Plan for Primary Diagnosis: Alcohol use disorder, severe, dependence ([place]) Long Term Goal(s): Knowledge of disease and therapeutic regimen to maintain health will improve  Short Term Goals: Ability to remain free from injury will improve, Ability to verbalize frustration and anger appropriately will improve, Ability to demonstrate self-control and Ability to disclose and discuss suicidal ideas  Medication Management: RN will administer medications as ordered by provider, will assess and evaluate patient's response and provide education to patient for prescribed medication. RN will report any adverse and/or side effects to prescribing provider.  Therapeutic Interventions: 1 on 1 counseling sessions, Psychoeducation, Medication administration, Evaluate responses to treatment, Monitor vital signs and CBGs as ordered, Perform/monitor CIWA, COWS, AIMS and Fall Risk screenings as ordered, Perform wound care treatments as  ordered.  Evaluation of Outcomes: Progressing   LCSW Treatment Plan for Primary Diagnosis: Alcohol use disorder, severe, dependence ([place]) Long Term Goal(s): Safe transition to appropriate next level of care at discharge, Engage patient in therapeutic group addressing interpersonal concerns.  Short Term Goals: Engage patient in aftercare planning with referrals and resources, Facilitate patient progression through stages of change regarding substance use diagnoses and concerns and Identify triggers associated with mental health/substance abuse issues  Therapeutic Interventions: Assess for all discharge needs, 1 to 1 time with Social worker, Explore available resources and support systems, Assess for adequacy in community support network, Educate family and significant other(s) on suicide prevention, Complete Psychosocial Assessment, Interpersonal group therapy.  Evaluation of Outcomes: Progressing   Progress in Treatment: Attending groups: Yes. Participating in groups: Yes. Taking medication as prescribed: Yes. Toleration medication: Yes. Family/Significant other contact made: SPE completed with pt; pt declined to consent to collateral contact.  Patient understands diagnosis: Yes. Discussing patient identified problems/goals with staff: Yes. Medical problems stabilized or resolved: Yes. Denies suicidal/homicidal ideation: Yes, pt denies SI currently.  Issues/concerns per patient self-inventory: No. Other: n/a   New problem(s) identified: No, Describe:  n/a  New Short Term/Long Term Goal(s): detox, medication management for mood stabilization; elimination of SI thoughts; development of comprehensive mental wellness/sobriety plan.   Patient Goals:  "To get sober."   Discharge Plan or Barriers: Pt plans to return home at discharge. She would like a referral to [facility] for medication management and therapy. [place] pamphlet, Mobile Crisis information, and AA/NA information provided  to patient for additional community support and resources.   Reason for Continuation of Hospitalization: Anxiety Depression Medication stabilization Withdrawal symptoms  Estimated Length of Stay: Tuesday, 12/25/18  Attendees: Patient: Erin [name] 12/24/2018 9:14 AM  Physician: Dr. Jake Samples MD 12/24/2018 9:14 AM  Nursing: Romie Minus; [place] RN 12/24/2018 9:14 AM  RN Care Manager:x 12/24/2018 9:14 AM  Social Worker: Janice Norrie LCSW 12/24/2018 9:14 AM  Recreational Therapist: x 12/24/2018 9:14 AM  Other: Harriett Sine NP 12/24/2018 9:14 AM  Other:  12/24/2018 9:14 AM  Other: 12/24/2018 9:14 AM    Scribe for Treatment Team: Avelina Laine, LCSW 12/24/2018 9:14 AM

## 2018-12-24 NOTE — Plan of Care (Signed)
Nurse discussed anxiety, depression, and coping skills with patient.  

## 2018-12-24 NOTE — Progress Notes (Signed)
Patient came to nursing station and requested her medications after AA meeting that she did not attend. She reported to Probation officer that she attended 2 groups today. She inquired about her medications and requested to take them. She received snacks and returned to her room to rest.

## 2018-12-24 NOTE — Progress Notes (Signed)
Recreation Therapy Notes  Date: 12.30.19 Time: 0930 Location: [address] Dayroom  Group Topic: Stress Management  Goal Area(s) Addresses:  Patient will identify stress management techniques. Patient will identify benefits of using stress management techniques post d/c.  Intervention: Stress Management  Activity :  Guided Imagery.  LRT introduced the stress management technique of guided imagery.  LRT read a scrip that guided patients on the beach to relax by the peacefully waves.  Education:  Stress Management, Discharge Planning.   Education Outcome: Acknowledges Education  Clinical Observations/Feedback:  Pt did not attend group.     Victorino Sparrow, LRT/CTRS         Ria Comment, Wallace Cogliano A 12/24/2018 10:56 AM

## 2018-12-24 NOTE — Progress Notes (Signed)
[facility] MD Progress Note  12/24/2018 9:20 AM Erin [name]  MRN:  [id number] Subjective:    Patient stating she is not ready to go she is complaining of nightmares continue depressive symptoms and vague thoughts of not wanting to be here without plans or intent, can contract here.  No acute psychosis.  No acute withdrawal Principal Problem: Alcohol use disorder, severe, dependence ([facility]) Diagnosis: Principal Problem:   Alcohol use disorder, severe, dependence ([place]) Active Problems:   Major depressive disorder, recurrent severe without psychotic features ([place])  Total Time spent with patient: 20 minutes  Past Medical History:  Past Medical History:  Diagnosis Date  . Anxiety   . Bipolar 1 disorder ([place])   . Blood transfusion July 2012  . Bronchitis   . Cirrhosis of liver ([place])   . Depression   . History of sarcoidosis   . Hypothyroidism   . Liver failure ([place])   . Menorrhagia   . Wegener's granulomatosis ([place])     Past Surgical History:  Procedure Laterality Date  . CHOLECYSTECTOMY    . FRACTURE SURGERY    . HERNIA REPAIR    . LAPAROSCOPY    . LAPAROTOMY  07/28/2011   Procedure: EXPLORATORY LAPAROTOMY;  Surgeon: [name], MD;  Location: [facility];  Service: Gynecology;  Laterality: N/A;  . ORTHOPEDIC SURGERY    . TUBAL LIGATION    . VAGINAL HYSTERECTOMY  07/27/2011   Procedure: HYSTERECTOMY VAGINAL;  Surgeon: [name], MD;  Location: [facility];  Service: Gynecology;  Laterality: N/A;  Vaginal Hysterectomy with Right Oophorectomy   Family History:  Family History  Problem Relation Age of Onset  . Diabetes Mother   . Hypertension Mother   . Diabetes Daughter   . Hypertension Daughter   Social History:  Social History   Substance and Sexual Activity  Alcohol Use Yes   Comment: went 8 months without drinking but drank 2 today to try to feel better     Social History   Substance and Sexual Activity  Drug Use Yes  . Types: Marijuana, Oxycodone    Social History    Socioeconomic History  . Marital status: Divorced    Spouse name: Not on file  . Number of children: Not on file  . Years of education: Not on file  . Highest education level: Not on file  Occupational History  . Not on file  Social Needs  . Financial resource strain: Not on file  . Food insecurity:    Worry: Not on file    Inability: Not on file  . Transportation needs:    Medical: Not on file    Non-medical: Not on file  Tobacco Use  . Smoking status: Former Smoker    Packs/day: 0.25    Types: Cigarettes  . Smokeless tobacco: Never Used  Substance and Sexual Activity  . Alcohol use: Yes    Comment: went 8 months without drinking but drank 2 today to try to feel better  . Drug use: Yes    Types: Marijuana, Oxycodone  . Sexual activity: Never  Lifestyle  . Physical activity:    Days per week: Not on file    Minutes per session: Not on file  . Stress: Not on file  Relationships  . Social connections:    Talks on phone: Not on file    Gets together: Not on file    Attends religious service: Not on file    Active member of club or organization: Not  on file    Attends meetings of clubs or organizations: Not on file    Relationship status: Not on file  Other Topics Concern  . Not on file  Social History Narrative  . Not on file   Additional Social History:                         Sleep: Good  Appetite:  Good  Current Medications: Current Facility-Administered Medications  Medication Dose Route Frequency Provider Last Rate Last Dose  . acetaminophen (TYLENOL) tablet 650 mg  650 mg Oral Q6H PRN Patrecia Pour, NP      . alum & mag hydroxide-simeth (MAALOX/MYLANTA) 200-200-20 MG/5ML suspension 30 mL  30 mL Oral Q4H PRN Patrecia Pour, NP      . cloNIDine (CATAPRES) tablet 0.1 mg  0.1 mg Oral BH-qamhs Sharma Covert, MD   0.1 mg at 12/24/18 0750   Followed by  . [START ON 12/25/2018] cloNIDine (CATAPRES) tablet 0.1 mg  0.1 mg Oral QAC breakfast  Sharma Covert, MD      . dicyclomine (BENTYL) tablet 20 mg  20 mg Oral Q6H PRN Sharma Covert, MD      . Derrill Memo ON 12/25/2018] DULoxetine (CYMBALTA) DR capsule 60 mg  60 mg Oral Daily Johnn Hai, MD      . gabapentin (NEURONTIN) capsule 100 mg  100 mg Oral BID Cobos, Myer Peer, MD   100 mg at 12/24/18 0750  . gabapentin (NEURONTIN) capsule 200 mg  200 mg Oral QHS Connye Burkitt, NP   200 mg at 12/23/18 2117  . hydrochlorothiazide (HYDRODIURIL) tablet 25 mg  25 mg Oral Daily Patrecia Pour, NP   25 mg at 12/24/18 0750  . hydrOXYzine (ATARAX/VISTARIL) tablet 25 mg  25 mg Oral Q6H PRN Sharma Covert, MD   25 mg at 12/23/18 2117  . lactulose (CHRONULAC) 10 GM/15ML solution 20 g  20 g Oral TID Patrecia Pour, NP   20 g at 12/24/18 0751  . levothyroxine (SYNTHROID, LEVOTHROID) tablet 50 mcg  50 mcg Oral QAC breakfast Patrecia Pour, NP   50 mcg at 12/24/18 0617  . loperamide (IMODIUM) capsule 2-4 mg  2-4 mg Oral PRN Sharma Covert, MD      . losartan (COZAAR) tablet 25 mg  25 mg Oral Daily Patrecia Pour, NP   25 mg at 12/24/18 0751  . magnesium hydroxide (MILK OF MAGNESIA) suspension 30 mL  30 mL Oral Daily PRN Patrecia Pour, NP      . methocarbamol (ROBAXIN) tablet 500 mg  500 mg Oral Q8H PRN Sharma Covert, MD   500 mg at 12/24/18 0802  . mometasone-formoterol (DULERA) 200-5 MCG/ACT inhaler 2 puff  2 puff Inhalation BID Patrecia Pour, NP   2 puff at 12/24/18 0751  . multivitamin with minerals tablet 1 tablet  1 tablet Oral Daily Sharma Covert, MD   1 tablet at 12/24/18 8527  . naproxen (NAPROSYN) tablet 500 mg  500 mg Oral BID PRN Sharma Covert, MD      . ondansetron (ZOFRAN-ODT) disintegrating tablet 4 mg  4 mg Oral Q6H PRN Sharma Covert, MD      . prazosin (MINIPRESS) capsule 4 mg  4 mg Oral QHS Johnn Hai, MD      . thiamine (B-1) injection 100 mg  100 mg Intramuscular Once Sharma Covert, MD      .  thiamine (VITAMIN B-1) tablet 100 mg  100 mg  Oral Daily Sharma Covert, MD   100 mg at 12/24/18 8921  . traZODone (DESYREL) tablet 100 mg  100 mg Oral QHS,MR X 1 Laverle Hobby, PA-C   100 mg at 12/23/18 2117    Lab Results:  Results for orders placed or performed during the hospital encounter of 12/21/18 (from the past 48 hour(s))  Ammonia     Status: None   Collection Time: 12/24/18  6:30 AM  Result Value Ref Range   Ammonia 28 9 - 35 umol/L    Comment: Performed at [facility], [place] [address], [place], [place] [address]    Blood Alcohol level:  Lab Results  Component Value Date   ETH [facility]) 12/20/2018   ETH <10 40/81/4481    Metabolic Disorder Labs: Lab Results  Component Value Date   HGBA1C 4.5 (L) 12/22/2018   MPG 82.45 12/22/2018   MPG 108 01/16/2016   Lab Results  Component Value Date   PROLACTIN 23.1 01/16/2016   Lab Results  Component Value Date   CHOL 210 (H) 01/12/2018   TRIG 97 01/12/2018   HDL 77 01/12/2018   CHOLHDL 2.7 01/12/2018   VLDL 26 01/16/2016   LDLCALC 114 (H) 01/12/2018   LDLCALC 142 (H) 01/16/2016    Physical Findings: AIMS: Facial and Oral Movements Muscles of Facial Expression: None, normal Lips and Perioral Area: None, normal Jaw: None, normal Tongue: None, normal,Extremity Movements Upper (arms, wrists, hands, fingers): None, normal Lower (legs, knees, ankles, toes): None, normal, Trunk Movements Neck, shoulders, hips: None, normal, Overall Severity Severity of abnormal movements (highest score from questions above): None, normal Incapacitation due to abnormal movements: None, normal Patient's awareness of abnormal movements (rate only patient's report): No Awareness, Dental Status Current problems with teeth and/or dentures?: No Does patient usually wear dentures?: No  CIWA:  CIWA-Ar Total: 0 COWS:  COWS Total Score: 9  Musculoskeletal: Strength & Muscle Tone: within normal limits Gait & Station: normal Psychiatric Specialty Exam: Physical  Exam  ROS  Blood pressure 131/80, pulse (!) 109, temperature 98.3 F (36.8 C), temperature source Oral, resp. rate 20, height 5\' 7"  (1.702 m), weight 108.9 kg, SpO2 94 %.Body mass index is 37.59 kg/m.  General Appearance: Casual  Eye Contact:  Good  Speech:  Clear and Coherent  Volume:  Decreased  Mood:  Anxious and Depressed  Affect:  Appropriate and Congruent  Thought Process:  Coherent  Orientation:  Full (Time, Place, and Person)  Thought Content:  Logical  Suicidal Thoughts:  No  Homicidal Thoughts:  No  Memory:  Immediate;   Fair  Judgement:  Fair  Insight:  Fair  Psychomotor Activity:  Normal  Concentration:  Concentration: Good  Recall:  Good  Fund of Knowledge:  Good  Language:  Good  Akathisia:  Negative  Handed:  Right  AIMS (if indicated):     Assets:  Desire for Improvement  ADL's:  Intact  Cognition:  WNL  Sleep:  Number of Hours: 6.5     Treatment Plan Summary: Daily contact with patient to assess and evaluate symptoms and progress in treatment, Medication management and Plan Escalate prazosin escalate Cymbalta continue cognitive therapy depression continue current measures for safety such as precautions, group and individual therapy  Shakenya Stoneberg, MD 12/24/2018, 9:20 AM

## 2018-12-24 NOTE — Progress Notes (Signed)
D:  Patient's self inventory sheet, patient has poor sleep, sleep medication not helpful.  Fair appetite, low energy level, good concentration.  Rated depression and anxiety 9, denied hopeless.  Withdrawals, cravings, agitation.  Denied SI.  Physical problems, pain, spasms.  Body pain #9 all over, no pain medicine.  Goal "talk to".  A:  Medications administered per MD orders.  Emotional support and encouragement given patient. R:  Denied SI and HI, contracts for safety.  Denied A/V hallucinations.  Safety maintained with 15 minute checks.

## 2018-12-24 NOTE — [facility] Group Notes (Signed)
LCSW Group Therapy Note   12/24/2018 1:15pm   Type of Therapy and Topic:  Group Therapy:  Overcoming Obstacles   Participation Level:  Active   Description of Group:    In this group patients will be encouraged to explore what they see as obstacles to their own wellness and recovery. They will be guided to discuss their thoughts, feelings, and behaviors related to these obstacles. The group will process together ways to cope with barriers, with attention given to specific choices patients can make. Each patient will be challenged to identify changes they are motivated to make in order to overcome their obstacles. This group will be process-oriented, with patients participating in exploration of their own experiences as well as giving and receiving support and challenge from other group members.   Therapeutic Goals: 1. Patient will identify personal and current obstacles as they relate to admission. 2. Patient will identify barriers that currently interfere with their wellness or overcoming obstacles.  3. Patient will identify feelings, thought process and behaviors related to these barriers. 4. Patient will identify two changes they are willing to make to overcome these obstacles:      Summary of Patient Progress   Erin [name] was attentive and engaged during today's processing group. She shared that her biggest obstacle involves "talking to the doctor. I haven't had anyone sit down with me and go over my labs or my medications." Pt was agitated during group but was redirectable and agreeable to speaking with the other MD after group regarding her concerns and questions. Pt reports having significant nightmares and cravings. Erin [name] continues to show progress in the group setting with improving insight.    Therapeutic Modalities:   Cognitive Behavioral Therapy Solution Focused Therapy Motivational Interviewing Relapse Prevention Therapy  Avelina Laine, LCSW 12/24/2018 11:52 AM

## 2018-12-24 NOTE — Progress Notes (Signed)
Adult Psychoeducational Group Note  Date:  12/24/2018 Time:  10:38 PM  Group Topic/Focus:  Wrap-Up Group:   The focus of this group is to help patients review their daily goal of treatment and discuss progress on daily workbooks.  Participation Level:  Active  Participation Quality:  Appropriate  Affect:  Appropriate  Cognitive:  Appropriate  Insight: Appropriate  Engagement in Group:  Engaged  Modes of Intervention:  Discussion  Additional Comments:  Pt goal was to speak with the doctor and pt stated goal was met.  Pt stated she was able to stay awake and socialize with others.  Pt rated the day at a 10/10.  Cailie Bosshart 12/24/2018, 10:38 PM

## 2018-12-24 NOTE — Progress Notes (Signed)
D:  Patient's self inventory sheet, patient has poor sleep, sleep medication not helpful.  Good appetite, low energy level, good concentration.  Rated depression and anxiety 9.  Withdrawals, tremors, diarrhea, cravings, cramping.  Denied SI, contracts for safety.  Physical problems, cramp, cravings, worst pain #9 all over.  Pain medicine not helpful.  Goal is talk to MD/staff.  Unsure of discharge plans. A:  Medications administered per MD orders.  Emotional support and encouragement given patient. R:  Patient denied SI and HI, contracts for safety.  Denied A/V hallucinations.  Denied pain.  Safety maintained with 15 minute checks.

## 2018-12-25 MED ORDER — CHLORDIAZEPOXIDE HCL 5 MG PO CAPS
10.0000 mg | ORAL_CAPSULE | Freq: Three times a day (TID) | ORAL | Status: DC
  Administered 2018-12-25 – 2018-12-26 (×4): 10 mg via ORAL
  Filled 2018-12-25 (×4): qty 2

## 2018-12-25 MED ORDER — GABAPENTIN 300 MG PO CAPS
300.0000 mg | ORAL_CAPSULE | Freq: Three times a day (TID) | ORAL | Status: DC
  Administered 2018-12-25 – 2018-12-26 (×3): 300 mg via ORAL
  Filled 2018-12-25 (×6): qty 1

## 2018-12-25 NOTE — Progress Notes (Signed)
[facility] MD Progress Note  12/25/2018 8:25 AM Erin [name]  MRN:  [id number] Subjective:  Patient continues to state she is not ready to go home due to cravings, due to the fact she is coming off of both opiates and alcohol drinking 1/5-day for 2 months, she states she is having some tremors and anxiety in the daytime.  Blood pressure is actually low but she is on hydrochlorothiazide and Cozaar, she has received some Catapres dosing.  She reports dysphoria anxiety poor sleep denies wanting to harm self or others at this point reports some nightmares. Requesting more medications for alcohol withdrawal No psychosis no seizure activity  Principal Problem: Alcohol use disorder, severe, dependence ([place]) Diagnosis: Principal Problem:   Alcohol use disorder, severe, dependence ([place]) Active Problems:   Major depressive disorder, recurrent severe without psychotic features ([place])  Total Time spent with patient: 20 minutes  Past Medical History:  Past Medical History:  Diagnosis Date  . Anxiety   . Bipolar 1 disorder ([place])   . Blood transfusion July 2012  . Bronchitis   . Cirrhosis of liver ([place])   . Depression   . History of sarcoidosis   . Hypothyroidism   . Liver failure ([place])   . Menorrhagia   . Wegener's granulomatosis ([place])     Past Surgical History:  Procedure Laterality Date  . CHOLECYSTECTOMY    . FRACTURE SURGERY    . HERNIA REPAIR    . LAPAROSCOPY    . LAPAROTOMY  07/28/2011   Procedure: EXPLORATORY LAPAROTOMY;  Surgeon: [name], MD;  Location: [facility];  Service: Gynecology;  Laterality: N/A;  . ORTHOPEDIC SURGERY    . TUBAL LIGATION    . VAGINAL HYSTERECTOMY  07/27/2011   Procedure: HYSTERECTOMY VAGINAL;  Surgeon: [name], MD;  Location: [facility];  Service: Gynecology;  Laterality: N/A;  Vaginal Hysterectomy with Right Oophorectomy   Family History:  Family History  Problem Relation Age of Onset  . Diabetes Mother   . Hypertension Mother   . Diabetes Daughter    . Hypertension Daughter     Social History:  Social History   Substance and Sexual Activity  Alcohol Use Yes   Comment: went 8 months without drinking but drank 2 today to try to feel better     Social History   Substance and Sexual Activity  Drug Use Yes  . Types: Marijuana, Oxycodone    Social History   Socioeconomic History  . Marital status: Divorced    Spouse name: Not on file  . Number of children: Not on file  . Years of education: Not on file  . Highest education level: Not on file  Occupational History  . Not on file  Social Needs  . Financial resource strain: Not on file  . Food insecurity:    Worry: Not on file    Inability: Not on file  . Transportation needs:    Medical: Not on file    Non-medical: Not on file  Tobacco Use  . Smoking status: Former Smoker    Packs/day: 0.25    Types: Cigarettes  . Smokeless tobacco: Never Used  Substance and Sexual Activity  . Alcohol use: Yes    Comment: went 8 months without drinking but drank 2 today to try to feel better  . Drug use: Yes    Types: Marijuana, Oxycodone  . Sexual activity: Never  Lifestyle  . Physical activity:    Days per week: Not on file  Minutes per session: Not on file  . Stress: Not on file  Relationships  . Social connections:    Talks on phone: Not on file    Gets together: Not on file    Attends religious service: Not on file    Active member of club or organization: Not on file    Attends meetings of clubs or organizations: Not on file    Relationship status: Not on file  Other Topics Concern  . Not on file  Social History Narrative  . Not on file   Additional Social History:                         Sleep: Good  Appetite:  Good  Current Medications: Current Facility-Administered Medications  Medication Dose Route Frequency Provider Last Rate Last Dose  . acetaminophen (TYLENOL) tablet 650 mg  650 mg Oral Q6H PRN Patrecia Pour, NP      . alum & mag  hydroxide-simeth (MAALOX/MYLANTA) 200-200-20 MG/5ML suspension 30 mL  30 mL Oral Q4H PRN Patrecia Pour, NP      . chlordiazePOXIDE (LIBRIUM) capsule 10 mg  10 mg Oral TID Johnn Hai, MD      . dicyclomine (BENTYL) tablet 20 mg  20 mg Oral Q6H PRN Sharma Covert, MD   20 mg at 12/24/18 0959  . DULoxetine (CYMBALTA) DR capsule 60 mg  60 mg Oral Daily Johnn Hai, MD   60 mg at 12/25/18 0813  . gabapentin (NEURONTIN) capsule 300 mg  300 mg Oral TID Johnn Hai, MD      . lactulose (CHRONULAC) 10 GM/15ML solution 20 g  20 g Oral TID Patrecia Pour, NP   20 g at 12/25/18 0816  . levothyroxine (SYNTHROID, LEVOTHROID) tablet 50 mcg  50 mcg Oral QAC breakfast Patrecia Pour, NP   50 mcg at 12/25/18 5638  . losartan (COZAAR) tablet 25 mg  25 mg Oral Daily Patrecia Pour, NP   25 mg at 12/25/18 0815  . magnesium hydroxide (MILK OF MAGNESIA) suspension 30 mL  30 mL Oral Daily PRN Patrecia Pour, NP      . methocarbamol (ROBAXIN) tablet 500 mg  500 mg Oral Q8H PRN Sharma Covert, MD   500 mg at 12/24/18 1726  . mometasone-formoterol (DULERA) 200-5 MCG/ACT inhaler 2 puff  2 puff Inhalation BID Patrecia Pour, NP   2 puff at 12/25/18 [phone]  . multivitamin with minerals tablet 1 tablet  1 tablet Oral Daily Sharma Covert, MD   1 tablet at 12/25/18 3329  . naproxen (NAPROSYN) tablet 500 mg  500 mg Oral BID PRN Sharma Covert, MD   500 mg at 12/24/18 0956  . prazosin (MINIPRESS) capsule 4 mg  4 mg Oral QHS Johnn Hai, MD   4 mg at 12/24/18 2059  . thiamine (B-1) injection 100 mg  100 mg Intramuscular Once Sharma Covert, MD      . thiamine (VITAMIN B-1) tablet 100 mg  100 mg Oral Daily Sharma Covert, MD   100 mg at 12/25/18 5188  . traZODone (DESYREL) tablet 100 mg  100 mg Oral QHS,MR X 1 Laverle Hobby, PA-C   100 mg at 12/24/18 2155    Lab Results:  Results for orders placed or performed during the hospital encounter of 12/21/18 (from the past 48 hour(s))  Ammonia      Status: None   Collection Time: 12/24/18  6:30 AM  Result Value Ref Range   Ammonia 28 9 - 35 umol/L    Comment: Performed at [facility], [place] [address] [place], [place] [address]    Blood Alcohol level:  Lab Results  Component Value Date   ETH 397 [facility]) 12/20/2018   ETH <10 65/79/0383    Metabolic Disorder Labs: Lab Results  Component Value Date   HGBA1C 4.5 (L) 12/22/2018   MPG 82.45 12/22/2018   MPG 108 01/16/2016   Lab Results  Component Value Date   PROLACTIN 23.1 01/16/2016   Lab Results  Component Value Date   CHOL 210 (H) 01/12/2018   TRIG 97 01/12/2018   HDL 77 01/12/2018   CHOLHDL 2.7 01/12/2018   VLDL 26 01/16/2016   LDLCALC 114 (H) 01/12/2018   LDLCALC 142 (H) 01/16/2016    Physical Findings: AIMS: Facial and Oral Movements Muscles of Facial Expression: None, normal Lips and Perioral Area: None, normal Jaw: None, normal Tongue: None, normal,Extremity Movements Upper (arms, wrists, hands, fingers): None, normal Lower (legs, knees, ankles, toes): None, normal, Trunk Movements Neck, shoulders, hips: None, normal, Overall Severity Severity of abnormal movements (highest score from questions above): None, normal Incapacitation due to abnormal movements: None, normal Patient's awareness of abnormal movements (rate only patient's report): No Awareness, Dental Status Current problems with teeth and/or dentures?: No Does patient usually wear dentures?: No  CIWA:  CIWA-Ar Total: 2 COWS:  COWS Total Score: 5  Musculoskeletal: Strength & Muscle Tone: within normal limits Gait & Station: normal Patient leans: N/A  Psychiatric Specialty Exam: Physical Exam  ROS  Blood pressure 91/70, pulse (!) 104, temperature (!) 97.5 F (36.4 C), resp. rate 20, height 5\' 7"  (1.702 m), weight 108.9 kg, SpO2 100 %.Body mass index is 37.59 kg/m.  General Appearance: Casual and Fairly Groomed  Eye Contact:  Good  Speech:  nl  Volume:  Normal  Mood:   Anxious and Dysphoric  Affect:  Appropriate and Congruent  Thought Process:  Coherent  Orientation:  Full (Time, Place, and Person)  Thought Content:  Tangential  Suicidal Thoughts:  No  Homicidal Thoughts:  No  Memory:  Immediate;   Fair  Judgement: good  Insight:  Fair  Psychomotor Activity:  Normal  Concentration:  Concentration: Good  Recall:  Good  Fund of Knowledge:  Good  Language:  Good  Akathisia:  Negative  Handed:  Right  AIMS (if indicated):     Assets:  Physical Health  ADL's:  Intact  Cognition:  WNL  Sleep:  Number of Hours: 6.5     Treatment Plan Summary: Daily contact with patient to assess and evaluate symptoms and progress in treatment, Medication management and We will go ahead and add low-dose Librium for detox purposes continue to monitor continue current precautions no change in other meds other than escalation of Neurontin and discontinuation of hydrochlorothiazide continue current rehab based therapy and cognitive based therapy  Tacori Kvamme, MD 12/25/2018, 8:25 AM

## 2018-12-25 NOTE — Plan of Care (Signed)
Nurse discussed anxiety, depression, coping skills with patient. 

## 2018-12-25 NOTE — Progress Notes (Signed)
Pt reports she is doing better, but still having some withdrawal symptoms.  She denies SI/HI/AVH.  She came to writer at the beginning of the shift to ask for her bedtime meds early.  Pt was told she could have them a few minutes early, but not at 2000.  Pt voiced understanding.  Pt has been pleasant and cooperative with staff.  She makes her needs known to staff.  Support and encouragement offered.  Discharge plans are in process.  Safety maintained with q15 minute checks.

## 2018-12-25 NOTE — Progress Notes (Addendum)
  [name] Adult Case Management Discharge Plan :  Will you be returning to the same living situation after discharge:  Yes,  home At discharge, do you have transportation home?: Yes,  bus--PT IS SCHEDULED TO DISCHARGE ON WED, 12/26/18.  Do you have the ability to pay for your medications: Yes,  medicare  Release of information consent forms completed and submitted to medical records by CSW.   Patient to Follow up at: Follow-up Information    [facility]. Go on 01/09/2019.   Why:  Therapy appointment is Wednesday, 01/09/19 at 4:00p. Medication management appointment is Saturday, 02/02/19 at 1:00p. Please bring: photo ID, proof of insurance, social security card, and any discharge paperwork from this hospitalization. Contact information: [place] [address] Phone: [phone] Fax: [phone]       [facility] Follow up on 12/31/2018.   Why:  Hospital follow-up on Monday, 1/6 at 3:45PM with your primary care provider. Make sure to ask them about how to gain access to your labwork/records. Thank you. Contact information: [address] [place], [place] [address] Phone: [phone] Fax: [facility] Follow up on 12/28/2018.   Specialty:  Behavioral Health Why:  Assessment for Chemical Dependancy Intensive Outpatient Program on Friday, 1/3 at 10am. Please complete new patient paperwork and bring with you to this appt along with your photo ID and insurance card. Thank you.  Contact information: [place] [phone] [place] [phone]          Next level of care provider has access to [place] and Suicide Prevention discussed: Yes,  SPE completed with pt; pt declined to consent to collateral contact. SPI pamphlet and mobile crisis information provided to pt.   Have you used any form of tobacco in the last 30 days?  (Cigarettes, Smokeless Tobacco, Cigars, and/or Pipes): No  Has patient been referred to the Quitline?: N/A patient is not a smoker  Patient has been referred for addiction treatment: Yes  Avelina Laine, LCSW 12/25/2018, 2:42 PM

## 2018-12-26 DIAGNOSIS — Z23 Encounter for immunization: Secondary | ICD-10-CM | POA: Diagnosis not present

## 2018-12-26 MED ORDER — BUDESONIDE-FORMOTEROL FUMARATE 160-4.5 MCG/ACT IN AERO: 2.0000 | INHALATION_SPRAY | Freq: Two times a day (BID) | RESPIRATORY_TRACT | 12 refills | Status: DC

## 2018-12-26 MED ORDER — PRAZOSIN HCL 2 MG PO CAPS: 4.0000 mg | ORAL_CAPSULE | Freq: Every day | ORAL | 0 refills | Status: DC

## 2018-12-26 MED ORDER — GABAPENTIN 300 MG PO CAPS: 300.0000 mg | ORAL_CAPSULE | Freq: Three times a day (TID) | ORAL | 0 refills | Status: DC

## 2018-12-26 MED ORDER — DULOXETINE HCL 60 MG PO CPEP: 60.0000 mg | ORAL_CAPSULE | Freq: Every day | ORAL | 0 refills | Status: DC

## 2018-12-26 MED ORDER — THYROID 30 MG PO TABS: 30.0000 mg | ORAL_TABLET | Freq: Every day | ORAL | 0 refills | Status: DC

## 2018-12-26 MED ORDER — TRAZODONE HCL 100 MG PO TABS: 100.0000 mg | ORAL_TABLET | Freq: Every evening | ORAL | 0 refills | Status: DC | PRN

## 2018-12-26 MED ORDER — HYDROCHLOROTHIAZIDE 25 MG PO TABS: 25.0000 mg | ORAL_TABLET | Freq: Every day | ORAL | 0 refills | Status: DC

## 2018-12-26 MED ORDER — LACTULOSE 10 GM/15ML PO SOLN: 20.0000 g | Freq: Three times a day (TID) | ORAL | 1 refills | Status: AC

## 2018-12-26 MED ORDER — LEVOTHYROXINE SODIUM 50 MCG PO TABS: 50.0000 ug | ORAL_TABLET | Freq: Every day | ORAL | 0 refills | Status: AC

## 2018-12-26 MED ORDER — MONTELUKAST SODIUM 10 MG PO TABS: 10.0000 mg | ORAL_TABLET | Freq: Every day | ORAL | 3 refills | Status: DC

## 2018-12-26 NOTE — Discharge Summary (Signed)
Physician Discharge Summary Note  Patient:  Erin [name] is an 53 y.o., female  MRN:  [id number]  DOB:  1966/05/29  Patient phone:  [phone] (home)   Patient address:   [address] [place] [address],   Total Time spent with patient: Greater than 30 minutes  Date of Admission:  12/21/2018  Date of Discharge: 12-26-18  Reason for Admission:    Principal Problem: Alcohol use disorder, severe, dependence [facility])  Discharge Diagnoses: Patient Active Problem List   Diagnosis Date Noted  . Leukopenia [D72.819] 10/15/2018  . Benzodiazepine abuse ([place]) [F13.10] 02/16/2017  . Substance induced mood disorder ([place]) [F19.94] 02/16/2017  . Alcohol use disorder, severe, dependence ([place]) [F10.20] 01/12/2016  . Cocaine dependence with cocaine-induced mood disorder ([place]) [F14.24]   . Opioid dependence with withdrawal ([place]) [F11.23]   . Bipolar disorder ([place]) [F31.9] 02/09/2015  . Alcohol dependence with alcohol-induced mood disorder ([place]) [F10.24]   . Suicidal ideations [R45.851]   . Major depressive disorder, recurrent severe without psychotic features ([place]) [F33.2]   . Opioid dependence ([place]) [F11.20] 05/06/2014  . Cocaine dependence ([place]) [F14.20] 07/17/2013  . Thrombocytopenia ([place]) [D69.6] 01/06/2013  . Sarcoidosis [D86.9] 03/30/2010  . HYPERTENSION, BENIGN ESSENTIAL [I10] 01/11/2010  . Hypothyroidism [E03.9] 03/02/2009  . Alcoholic liver damage ([place]) [K70.9] 03/06/2008  . CIRRHOSIS, ALCOHOLIC, LIVER [H82.99] 37/16/9678  . TOBACCO DEPENDENCE [F17.200] 02/22/2007  . EXTERNAL HEMORRHOIDS [K64.4] 01/11/2005  . PORTAL HYPERTENSION [K76.6] 01/11/2005   Musculoskeletal: Strength & Muscle Tone: within normal limits Gait & Station: normal Patient leans: N/A  Psychiatric Specialty Exam: Physical Exam  Nursing note and vitals reviewed. Constitutional: She is oriented to person, place, and time. She appears well-developed.  HENT:  Head: Normocephalic.  Eyes:  Pupils are equal, round, and reactive to light.  Neck: Normal range of motion.  Cardiovascular: Normal rate.  Respiratory: Effort normal.  GI: Soft.  Genitourinary:    Genitourinary Comments: Deferred   Musculoskeletal: Normal range of motion.  Neurological: She is alert and oriented to person, place, and time.  Skin: Skin is warm and dry.  Psychiatric: Her speech is normal and behavior is normal. Judgment and thought content normal. Her mood appears not anxious. Her affect is not angry, not blunt, not labile and not inappropriate. Cognition and memory are normal. She does not exhibit a depressed mood.    Review of Systems  Constitutional: Negative.   HENT: Negative.   Eyes: Negative.   Respiratory: Negative.  Negative for cough and shortness of breath.   Cardiovascular: Negative.  Negative for chest pain and palpitations.  Gastrointestinal: Negative.  Negative for abdominal pain, heartburn, nausea and vomiting.  Genitourinary: Negative.   Musculoskeletal: Negative.   Skin: Negative.   Neurological: Negative.  Negative for dizziness and headaches.  Endo/Heme/Allergies: Negative.   Psychiatric/Behavioral: Positive for depression (Stable) and substance abuse (Hx. Alcohol, opioid & THC use disorders). Negative for hallucinations, memory loss and suicidal ideas. The patient has insomnia (Stable). The patient is not nervous/anxious (Stable).     Blood pressure 109/78, pulse 93, temperature 97.8 F (36.6 C), temperature source Oral, resp. rate 17, height 5\' 7"  (1.702 m), weight 108.9 kg, SpO2 98 %.Body mass index is 37.59 kg/m.  See Md's discharge SRA   Past Medical History:  Past Medical History:  Diagnosis Date  . Anxiety   . Bipolar 1 disorder ([place])   . Blood transfusion July 2012  . Bronchitis   . Cirrhosis of liver ([place])   . Depression   .  History of sarcoidosis   . Hypothyroidism   . Liver failure ([place])   . Menorrhagia   . Wegener's granulomatosis ([place])     Past Surgical  History:  Procedure Laterality Date  . CHOLECYSTECTOMY    . FRACTURE SURGERY    . HERNIA REPAIR    . LAPAROSCOPY    . LAPAROTOMY  07/28/2011   Procedure: EXPLORATORY LAPAROTOMY;  Surgeon: [name], MD;  Location: [facility];  Service: Gynecology;  Laterality: N/A;  . ORTHOPEDIC SURGERY    . TUBAL LIGATION    . VAGINAL HYSTERECTOMY  07/27/2011   Procedure: HYSTERECTOMY VAGINAL;  Surgeon: [name], MD;  Location: [facility];  Service: Gynecology;  Laterality: N/A;  Vaginal Hysterectomy with Right Oophorectomy   Family History:  Family History  Problem Relation Age of Onset  . Diabetes Mother   . Hypertension Mother   . Diabetes Daughter   . Hypertension Daughter    Social History:  Social History   Substance and Sexual Activity  Alcohol Use Yes   Comment: went 8 months without drinking but drank 2 today to try to feel better     Social History   Substance and Sexual Activity  Drug Use Yes  . Types: Marijuana, Oxycodone    Social History   Socioeconomic History  . Marital status: Divorced    Spouse name: Not on file  . Number of children: Not on file  . Years of education: Not on file  . Highest education level: Not on file  Occupational History  . Not on file  Social Needs  . Financial resource strain: Not on file  . Food insecurity:    Worry: Not on file    Inability: Not on file  . Transportation needs:    Medical: Not on file    Non-medical: Not on file  Tobacco Use  . Smoking status: Former Smoker    Packs/day: 0.25    Types: Cigarettes  . Smokeless tobacco: Never Used  Substance and Sexual Activity  . Alcohol use: Yes    Comment: went 8 months without drinking but drank 2 today to try to feel better  . Drug use: Yes    Types: Marijuana, Oxycodone  . Sexual activity: Never  Lifestyle  . Physical activity:    Days per week: Not on file    Minutes per session: Not on file  . Stress: Not on file  Relationships  . Social connections:    Talks on  phone: Not on file    Gets together: Not on file    Attends religious service: Not on file    Active member of club or organization: Not on file    Attends meetings of clubs or organizations: Not on file    Relationship status: Not on file  Other Topics Concern  . Not on file  Social History Narrative  . Not on file   Risk to Self: No Risk to Others: No Prior Inpatient Therapy: Yes ([facility] x 3) Prior Outpatient Therapy: Yes  Level of Care:  OP  Hospital Course: (Per Md's admission SRA): 53 year old female, lives alone , on disability, has two adult children. Presented to hospital reporting heavy/daily alcohol abuse, depression, suicidal ideations without specific plan or intention. Contributing triggers are alcohol, substance abuse. loneliness and sadness that her children did not come to visit her for Christmas. Reports neuro-vegetative symptoms including poor sleep, poor appetite, poor energy level. Reports she has been drinking wine and beer every  day. She drinks up to a fifth of vodka daily. She also reports daily opiate analgesic abuse (such as Percocet) in varying quantities depending on availability. Admission BAL 397, admission UDS positive for opiates. History of prior psychiatric admission in 2017 for alcohol dependence. Patient reports she has not been taking her prescribed medications for at least several weeks. Recently prescribed  psychiatric medications- Vyvanse. Medical History is remarkable for HTN,  Liver Cirrhosis, Hypothyroidism. History of Sarcoidosis, Wegener's Granulomatosis. Patient also endorses history of alcohol WDL.  This is one of several discharge summaries from this [facility] for [facility]. She is known on this unit from previous hospitalization for polysubstance use disorders & other related mental illness. She was last treated & discharged from this hospital in 2017. This time around, she was admitted for alcohol detoxification & mood stabilization treatments. Her blood  alcohol level upon admission was 379 per toxicology tests reports & UDS positive for opioid. She was intoxicated & complaining of substance withdrawal symptoms/worsening depression. She cited as her trigger, loneliness over the holidays as her children did not care to visit her. [name]'s lab reports also indicated elevated liver enzymes (AST-64), possibly from chronic alcoholism. Her Ammonium level was also elevated at 50. This was noted to be normalized at 28 with her most recent ammonia check.  After evaluation of her presenting symptoms, [name] was recommended for alcohol detox on a prn basis per CIWA reports using Librium 25 mg PRN. She was also medicated & discharged on Neurontin 300 mg for substance withdrawal syndrome/agitation, Trazodone 100 mg prn for sleep, Duloxetine 60 mg for depression & Minipress 4 mg for PTSD related nightmares  She was resumed on all her pertinent home medications for her other pre-existing medical issues that she presented. She tolerated her treatment regimen without any significant adverse effects or reactions reported. She participated in the AA/NA meetings & group counseling sessions being offered and held on this unit. She learned coping skills.  [name]'s symptoms responded well to his treatment regimen. This is evidenced by her reports of improved mood, absence of suicidal ideations & or substance withdrawal symptoms. She is currently mentally & medically stable. [name] is currently being discharged to her home to continue mental health treatment, substance abuse treatment & medication management on an outpatient basis as noted below. She has been given all the necessary information needed to make these appointments without problems.   Upon discharge, she denies any current SIHI, AVH, delusional thoughts, paranoia & or substance withdrawal symptoms. She was able to engage in safety planning including plan to return to [facility] or contact emergency services if she feels unable  to maintain her own safety or the safety of others. Pt had no further questions, comments or concerns. She  left [facility] with all personal belongings in no apparent distress.    Discharge Vitals:   Blood pressure 109/78, pulse 93, temperature 97.8 F (36.6 C), temperature source Oral, resp. rate 17, height 5\' 7"  (1.702 m), weight 108.9 kg, SpO2 98 %. Body mass index is 37.59 kg/m.  Lab Results:   Results for orders placed or performed during the hospital encounter of 12/21/18 (from the past 72 hour(s))  Ammonia     Status: None   Collection Time: 12/24/18  6:30 AM  Result Value Ref Range   Ammonia 28 9 - 35 umol/L    Comment: Performed at [facility], [place] [address] [place], [place] [address]    Physical Findings: AIMS: Facial and Oral Movements Muscles of  Facial Expression: None, normal Lips and Perioral Area: None, normal Jaw: None, normal Tongue: None, normal,Extremity Movements Upper (arms, wrists, hands, fingers): None, normal Lower (legs, knees, ankles, toes): None, normal, Trunk Movements Neck, shoulders, hips: None, normal, Overall Severity Severity of abnormal movements (highest score from questions above): None, normal Incapacitation due to abnormal movements: None, normal Patient's awareness of abnormal movements (rate only patient's report): No Awareness, Dental Status Current problems with teeth and/or dentures?: No Does patient usually wear dentures?: No  CIWA:  CIWA-Ar Total: 1 COWS:  COWS Total Score: 1  See Psychiatric Specialty Exam and Suicide Risk Assessment completed by Attending Physician prior to discharge.  Discharge destination:  Home  Is patient on multiple antipsychotic therapies at discharge:  No   Has Patient had three or more failed trials of antipsychotic monotherapy by history:  No  Recommended Plan for Multiple Antipsychotic Therapies: NA  Allergies as of 12/26/2018      Reactions   Fluoxetine Hcl Other (See Comments)     hyperactivity   Metoclopramide Hcl Other (See Comments)   depression   Morphine And Related Other (See Comments)   High doses cause hallucinations      Medication List    STOP taking these medications   atomoxetine 100 MG capsule Commonly known as:  STRATTERA   hydrochlorothiazide 25 MG tablet Commonly known as:  HYDRODIURIL   losartan 25 MG tablet Commonly known as:  COZAAR   montelukast 10 MG tablet Commonly known as:  SINGULAIR   ondansetron 4 MG tablet Commonly known as:  ZOFRAN   thyroid 30 MG tablet Commonly known as:  ARMOUR   Vitamin C Powd     TAKE these medications     Indication  budesonide-formoterol 160-4.5 MCG/ACT inhaler Commonly known as:  SYMBICORT Inhale 2 puffs into the lungs 2 (two) times daily. For shortness of breath What changed:    when to take this  additional instructions  Indication:  Chronic Obstructive Lung Disease   DULoxetine 60 MG capsule Commonly known as:  CYMBALTA Take 1 capsule (60 mg total) by mouth daily. For depression Start taking on:  December 27, 2018  Indication:  Major Depressive Disorder   gabapentin 300 MG capsule Commonly known as:  NEURONTIN Take 1 capsule (300 mg total) by mouth 3 (three) times daily. For agitation  Indication:  Agitation   lactulose 10 GM/15ML solution Commonly known as:  CHRONULAC Take 30 mLs (20 g total) by mouth 3 (three) times daily. (May buy from over there counter): For constipation/ammonia control What changed:  additional instructions  Indication:  Chronic Constipation, High ammonia level   levothyroxine 50 MCG tablet Commonly known as:  SYNTHROID, LEVOTHROID Take 1 tablet (50 mcg total) by mouth daily before breakfast. For low functioning thyroid What changed:  additional instructions  Indication:  Underactive Thyroid   prazosin 2 MG capsule Commonly known as:  MINIPRESS Take 2 capsules (4 mg total) by mouth at bedtime. For PTSD related nightmares What changed:    how much  to take  additional instructions  Indication:  Frightening Dreams, PTSD   traZODone 100 MG tablet Commonly known as:  DESYREL Take 1 tablet (100 mg total) by mouth at bedtime as needed for sleep.  Indication:  [facility]. Go on 01/09/2019.   Why:  Therapy appointment is Wednesday, 01/09/19 at 4:00p. Medication management appointment is Saturday, 02/02/19 at 1:00p. Please bring: photo ID,  proof of insurance, social security card, and any discharge paperwork from this hospitalization. Contact information: [place] [address] Phone: [phone] Fax: [phone]       [facility] Follow up on 12/31/2018.   Why:  Hospital follow-up on Monday, 1/6 at 3:45PM with your primary care provider. Make sure to ask them about how to gain access to your labwork/records. Thank you. Contact information: [place] [place], [place] [address] Phone: [phone] Fax: [facility] Follow up on 12/28/2018.   Specialty:  Behavioral Health Why:  Assessment for Chemical Dependancy Intensive Outpatient Program on Friday, 1/3 at 10am. Please complete new patient paperwork and bring with you to this appt along with your photo ID and insurance card. Thank you.  Contact information: [place] [id number] [place] [phone]         Follow-up recommendations: Activity:  As tolerated Diet: As recommended by your primary care doctor. Keep all scheduled follow-up appointments as recommended.    Comments:  Take all your medications as prescribed by your mental healthcare provider. Report any adverse effects and or reactions from your medicines to your outpatient provider promptly. Patient is instructed and cautioned to not engage in alcohol and or illegal drug use while on prescription medicines. In the event of  worsening symptoms, patient is instructed to call the crisis hotline, 911 and or go to the nearest ED for appropriate evaluation and treatment of symptoms. Follow-up with your primary care provider for your other medical issues, concerns and or health care needs.   Signed: Lindell Spar, PMHNP, FNP-BC 12/26/2018, 9:31 AM

## 2018-12-26 NOTE — BHH Suicide Risk Assessment (Signed)
[facility] Discharge Suicide Risk Assessment   Principal Problem: Alcohol use disorder, severe, dependence ([place]) Discharge Diagnoses: Principal Problem:   Alcohol use disorder, severe, dependence ([place]) Active Problems:   Major depressive disorder, recurrent severe without psychotic features ([place])   Total Time spent with patient: 45 minutes   Mental Status Per Nursing Assessment::   On Admission:  NA Current mental status exam-alert oriented cooperative affect a little constricted no thoughts of harming self contracting fully no thoughts of harming others no acute psychosis no mania Meds are discussed stable for discharge Demographic Factors:  Caucasian  Loss Factors: Decrease in vocational status  Historical Factors: NA  Risk Reduction Factors:   NA  Continued Clinical Symptoms:  Alcohol/Substance Abuse/Dependencies  Cognitive Features That Contribute To Risk:  None    Suicide Risk:  Minimal: No identifiable suicidal ideation.  Patients presenting with no risk factors but with morbid ruminations; may be classified as minimal risk based on the severity of the depressive symptoms  Follow-up Information    [facility]. Go on 01/09/2019.   Why:  Therapy appointment is Wednesday, 01/09/19 at 4:00p. Medication management appointment is Saturday, 02/02/19 at 1:00p. Please bring: photo ID, proof of insurance, social security card, and any discharge paperwork from this hospitalization. Contact information: [place] [address] Phone: [phone] Fax: [phone]       [facility] Follow up on 12/31/2018.   Why:  Hospital follow-up on Monday, 1/6 at 3:45PM with your primary care provider. Make sure to ask them about how to gain access to your labwork/records. Thank you. Contact information: [place]. [place], [place] [address] Phone: [phone] Fax: [facility] Follow up on  12/28/2018.   Specialty:  Behavioral Health Why:  Assessment for Chemical Dependancy Intensive Outpatient Program on Friday, 1/3 at 10am. Please complete new patient paperwork and bring with you to this appt along with your photo ID and insurance card. Thank you.  Contact information: [place] [id number] [place] [phone]          Plan Of Care/Follow-up recommendations:  Activity:  full  Aariah Godette, MD 12/26/2018, 8:44 AM

## 2018-12-26 NOTE — Progress Notes (Signed)
Adult Psychoeducational Group Note  Date:  12/26/2018 Time:  7:57 AM  Group Topic/Focus:  Goals Group:   The focus of this group is to help patients establish daily goals to achieve during treatment and discuss how the patient can incorporate goal setting into their daily lives to aide in recovery.  Participation Level:  Active  Participation Quality:  Appropriate and Attentive  Affect:  Appropriate  Cognitive:  Alert and Appropriate  Insight: Appropriate  Engagement in Group:  Engaged  Modes of Intervention:  Activity, Clarification, Discussion, Education and Support  Additional Comments:  Pt completed her self-inventory and stated she was ready for discharge.  She said she was going to pick up her dog from a friend and go home.  Pt stated the only support she has is her dog, and the friend [place].  Her New Year's resolution is to stop drinking and using drugs.  Pt will be attending outpatient therapy 4 times per week.  Pt denied S/I and H/I.  Versie Starks  MHT/LRT/CTRS 12/26/2018, 7:57 AM

## 2018-12-26 NOTE — Progress Notes (Signed)
Discharge note: Patient reviewed discharge paperwork with RN including prescriptions, follow up appointments, and lab work. Patient given the opportunity to ask questions. All concerns were addressed. All belongings were returned to patient. Denied SI/HI/AVH. Patient thanked staff for their care while at the hospital.  Patient was discharged to lobby where her friend was picking her up.

## 2018-12-26 NOTE — Progress Notes (Signed)
Pt says she is doing better, but still says she is anxious and having some mild withdrawal symptoms.  She denies SI/HI/AVH.  She has been in the dayroom for a little while this evening, but for the most part stays in her room.  Pt is med compliant.  She makes her needs known to staff.  Pt has been pleasant and cooperative with staff.  Support and encouragement offered.  Discharge plans are in process.  Safety maintained with q15 minute checks.

## 2018-12-26 NOTE — Plan of Care (Signed)
  Problem: Education: Goal: Knowledge of [facility] Education information/materials will improve Outcome: Adequate for Discharge   Problem: Education: Goal: Emotional status will improve Outcome: Adequate for Discharge   Problem: Education: Goal: Mental status will improve Outcome: Adequate for Discharge   Problem: Education: Goal: Verbalization of understanding the information provided will improve Outcome: Adequate for Discharge   Problem: Activity: Goal: Interest or engagement in activities will improve Outcome: Adequate for Discharge   

## 2018-12-28 ENCOUNTER — Ambulatory Visit (INDEPENDENT_AMBULATORY_CARE_PROVIDER_SITE_OTHER): Payer: Medicare Other | Admitting: Psychology

## 2018-12-28 ENCOUNTER — Encounter (HOSPITAL_COMMUNITY): Payer: Self-pay | Admitting: Psychology

## 2018-12-28 DIAGNOSIS — F4312 Post-traumatic stress disorder, chronic: Secondary | ICD-10-CM | POA: Diagnosis not present

## 2018-12-28 DIAGNOSIS — F1020 Alcohol dependence, uncomplicated: Secondary | ICD-10-CM
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
## 2018-12-31 DIAGNOSIS — J4530 Mild persistent asthma, uncomplicated: Secondary | ICD-10-CM | POA: Diagnosis not present

## 2018-12-31 DIAGNOSIS — R7303 Prediabetes: Secondary | ICD-10-CM | POA: Diagnosis not present

## 2018-12-31 DIAGNOSIS — I10 Essential (primary) hypertension: Secondary | ICD-10-CM | POA: Diagnosis not present

## 2018-12-31 DIAGNOSIS — Z720 Tobacco use: Secondary | ICD-10-CM | POA: Diagnosis not present

## 2018-12-31 DIAGNOSIS — E039 Hypothyroidism, unspecified: Secondary | ICD-10-CM | POA: Diagnosis not present

## 2019-01-09 ENCOUNTER — Ambulatory Visit (HOSPITAL_COMMUNITY): Payer: Medicare Other | Admitting: Licensed Clinical Social Worker

## 2019-01-10 DIAGNOSIS — H2513 Age-related nuclear cataract, bilateral: Secondary | ICD-10-CM | POA: Diagnosis not present

## 2019-01-10 DIAGNOSIS — D3132 Benign neoplasm of left choroid: Secondary | ICD-10-CM | POA: Diagnosis not present

## 2019-01-10 DIAGNOSIS — H04129 Dry eye syndrome of unspecified lacrimal gland: Secondary | ICD-10-CM | POA: Diagnosis not present

## 2019-01-11 ENCOUNTER — Encounter (HOSPITAL_COMMUNITY): Payer: Self-pay | Admitting: Psychology
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
## 2019-01-14 ENCOUNTER — Other Ambulatory Visit (HOSPITAL_COMMUNITY): Payer: Self-pay | Admitting: Medical

## 2019-01-14 ENCOUNTER — Ambulatory Visit (HOSPITAL_COMMUNITY): Payer: Medicare Other | Admitting: Psychology

## 2019-01-14 ENCOUNTER — Encounter (HOSPITAL_COMMUNITY): Payer: Self-pay | Admitting: Medical

## 2019-01-14 ENCOUNTER — Other Ambulatory Visit (HOSPITAL_COMMUNITY): Payer: Medicare Other | Admitting: Medical

## 2019-01-14 DIAGNOSIS — M25551 Pain in right hip: Secondary | ICD-10-CM | POA: Diagnosis not present

## 2019-01-14 DIAGNOSIS — M25552 Pain in left hip: Secondary | ICD-10-CM | POA: Diagnosis not present

## 2019-01-15 DIAGNOSIS — M5136 Other intervertebral disc degeneration, lumbar region: Secondary | ICD-10-CM | POA: Diagnosis not present

## 2019-01-16 ENCOUNTER — Other Ambulatory Visit (HOSPITAL_COMMUNITY): Payer: Medicare Other | Attending: Psychiatry | Admitting: Medical

## 2019-01-16 ENCOUNTER — Encounter (HOSPITAL_COMMUNITY): Payer: Self-pay | Admitting: Medical

## 2019-01-16 VITALS — BP 122/80 | HR 74 | Ht 67.0 in | Wt 241.0 lb

## 2019-01-16 DIAGNOSIS — Z9049 Acquired absence of other specified parts of digestive tract: Secondary | ICD-10-CM | POA: Diagnosis not present

## 2019-01-16 DIAGNOSIS — Z833 Family history of diabetes mellitus: Secondary | ICD-10-CM | POA: Insufficient documentation

## 2019-01-16 DIAGNOSIS — F419 Anxiety disorder, unspecified: Secondary | ICD-10-CM

## 2019-01-16 DIAGNOSIS — F1020 Alcohol dependence, uncomplicated: Secondary | ICD-10-CM

## 2019-01-16 DIAGNOSIS — F902 Attention-deficit hyperactivity disorder, combined type: Secondary | ICD-10-CM

## 2019-01-16 DIAGNOSIS — F1210 Cannabis abuse, uncomplicated: Secondary | ICD-10-CM | POA: Diagnosis not present

## 2019-01-16 DIAGNOSIS — Z885 Allergy status to narcotic agent status: Secondary | ICD-10-CM | POA: Diagnosis not present

## 2019-01-16 DIAGNOSIS — Z6837 Body mass index (BMI) 37.0-37.9, adult: Secondary | ICD-10-CM | POA: Insufficient documentation

## 2019-01-16 DIAGNOSIS — F1120 Opioid dependence, uncomplicated: Secondary | ICD-10-CM | POA: Diagnosis not present

## 2019-01-16 DIAGNOSIS — F1910 Other psychoactive substance abuse, uncomplicated: Secondary | ICD-10-CM

## 2019-01-16 DIAGNOSIS — Z6372 Alcoholism and drug addiction in family: Secondary | ICD-10-CM

## 2019-01-16 DIAGNOSIS — F17210 Nicotine dependence, cigarettes, uncomplicated: Secondary | ICD-10-CM | POA: Insufficient documentation

## 2019-01-16 DIAGNOSIS — F1994 Other psychoactive substance use, unspecified with psychoactive substance-induced mood disorder: Secondary | ICD-10-CM | POA: Diagnosis not present

## 2019-01-16 DIAGNOSIS — F341 Dysthymic disorder: Secondary | ICD-10-CM

## 2019-01-16 DIAGNOSIS — F319 Bipolar disorder, unspecified: Secondary | ICD-10-CM | POA: Insufficient documentation

## 2019-01-16 DIAGNOSIS — Z79890 Hormone replacement therapy (postmenopausal): Secondary | ICD-10-CM | POA: Insufficient documentation

## 2019-01-16 DIAGNOSIS — I10 Essential (primary) hypertension: Secondary | ICD-10-CM | POA: Diagnosis not present

## 2019-01-16 DIAGNOSIS — K7030 Alcoholic cirrhosis of liver without ascites: Secondary | ICD-10-CM | POA: Diagnosis not present

## 2019-01-16 DIAGNOSIS — F17200 Nicotine dependence, unspecified, uncomplicated: Secondary | ICD-10-CM

## 2019-01-16 DIAGNOSIS — F4312 Post-traumatic stress disorder, chronic: Secondary | ICD-10-CM

## 2019-01-16 DIAGNOSIS — Z79899 Other long term (current) drug therapy: Secondary | ICD-10-CM | POA: Diagnosis not present

## 2019-01-16 DIAGNOSIS — E039 Hypothyroidism, unspecified: Secondary | ICD-10-CM

## 2019-01-16 DIAGNOSIS — Z888 Allergy status to other drugs, medicaments and biological substances status: Secondary | ICD-10-CM | POA: Insufficient documentation

## 2019-01-16 DIAGNOSIS — Z8249 Family history of ischemic heart disease and other diseases of the circulatory system: Secondary | ICD-10-CM | POA: Insufficient documentation

## 2019-01-16 DIAGNOSIS — Z811 Family history of alcohol abuse and dependence: Secondary | ICD-10-CM | POA: Diagnosis not present

## 2019-01-16 DIAGNOSIS — R45851 Suicidal ideations: Secondary | ICD-10-CM | POA: Insufficient documentation

## 2019-01-16 DIAGNOSIS — F1123 Opioid dependence with withdrawal: Secondary | ICD-10-CM | POA: Insufficient documentation
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
## 2019-01-17 ENCOUNTER — Encounter (HOSPITAL_COMMUNITY): Payer: Self-pay | Admitting: Medical

## 2019-01-17 ENCOUNTER — Encounter (HOSPITAL_COMMUNITY): Payer: Self-pay | Admitting: Psychology

## 2019-01-17 ENCOUNTER — Other Ambulatory Visit (HOSPITAL_COMMUNITY): Payer: Medicare Other

## 2019-01-18 ENCOUNTER — Telehealth (HOSPITAL_COMMUNITY): Payer: Self-pay | Admitting: Psychology
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
## 2019-01-18 NOTE — Progress Notes (Signed)
Erin [name] is a 53 y.o. female patient. CD-IOP: The patient arrived to group at 3 pm. She expressed frustration stating she had been at [employer] since 12:30 pm. The patient is a patient at [facility] and receiving Suboxone for her opioid dependence. She was frustrated and confused. The patient was engaged in the presentation, but she will not be billed nor credited with having attended this session.         Brandon Melnick, LCAS

## 2019-01-21 ENCOUNTER — Other Ambulatory Visit (HOSPITAL_COMMUNITY): Payer: Medicare Other | Admitting: Psychology

## 2019-01-21 ENCOUNTER — Encounter (HOSPITAL_COMMUNITY): Payer: Self-pay | Admitting: Medical

## 2019-01-21 DIAGNOSIS — E039 Hypothyroidism, unspecified: Secondary | ICD-10-CM

## 2019-01-21 DIAGNOSIS — F1910 Other psychoactive substance abuse, uncomplicated: Secondary | ICD-10-CM

## 2019-01-21 DIAGNOSIS — F1210 Cannabis abuse, uncomplicated: Secondary | ICD-10-CM

## 2019-01-21 DIAGNOSIS — F1994 Other psychoactive substance use, unspecified with psychoactive substance-induced mood disorder: Secondary | ICD-10-CM

## 2019-01-21 DIAGNOSIS — F1020 Alcohol dependence, uncomplicated: Secondary | ICD-10-CM

## 2019-01-21 DIAGNOSIS — F341 Dysthymic disorder: Secondary | ICD-10-CM

## 2019-01-21 DIAGNOSIS — Z79899 Other long term (current) drug therapy: Secondary | ICD-10-CM

## 2019-01-21 DIAGNOSIS — F1120 Opioid dependence, uncomplicated: Secondary | ICD-10-CM

## 2019-01-21 DIAGNOSIS — Z6372 Alcoholism and drug addiction in family: Secondary | ICD-10-CM

## 2019-01-21 DIAGNOSIS — K7030 Alcoholic cirrhosis of liver without ascites: Secondary | ICD-10-CM

## 2019-01-21 DIAGNOSIS — F17200 Nicotine dependence, unspecified, uncomplicated: Secondary | ICD-10-CM

## 2019-01-21 DIAGNOSIS — F4312 Post-traumatic stress disorder, chronic: Secondary | ICD-10-CM

## 2019-01-21 DIAGNOSIS — F419 Anxiety disorder, unspecified: Secondary | ICD-10-CM

## 2019-01-21 DIAGNOSIS — F902 Attention-deficit hyperactivity disorder, combined type: Secondary | ICD-10-CM

## 2019-01-21 NOTE — Progress Notes (Signed)
Fu FOR MEDICATION RECONCILIATION sTOP PSYCH MEDS PRESCRIBED AT [facility] (rEXULTI AND vRYLAR AND RESUME DISCHARGE MEDICATION cYMBALTA-PT PREFERS

## 2019-01-22 DIAGNOSIS — M5416 Radiculopathy, lumbar region: Secondary | ICD-10-CM | POA: Diagnosis not present

## 2019-01-22 DIAGNOSIS — M542 Cervicalgia: Secondary | ICD-10-CM | POA: Diagnosis not present

## 2019-01-22 DIAGNOSIS — M5412 Radiculopathy, cervical region: Secondary | ICD-10-CM | POA: Diagnosis not present

## 2019-01-22 DIAGNOSIS — M5136 Other intervertebral disc degeneration, lumbar region: Secondary | ICD-10-CM | POA: Diagnosis not present
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
## 2019-01-23 ENCOUNTER — Other Ambulatory Visit (HOSPITAL_COMMUNITY): Payer: Medicare Other | Admitting: Psychology

## 2019-01-23 DIAGNOSIS — F1020 Alcohol dependence, uncomplicated: Secondary | ICD-10-CM

## 2019-01-23 DIAGNOSIS — F1120 Opioid dependence, uncomplicated: Secondary | ICD-10-CM

## 2019-01-23 DIAGNOSIS — F1910 Other psychoactive substance abuse, uncomplicated: Secondary | ICD-10-CM

## 2019-01-23 DIAGNOSIS — F902 Attention-deficit hyperactivity disorder, combined type: Secondary | ICD-10-CM

## 2019-01-24 ENCOUNTER — Other Ambulatory Visit (HOSPITAL_COMMUNITY): Payer: Medicare Other | Admitting: Psychology

## 2019-01-24 DIAGNOSIS — F1020 Alcohol dependence, uncomplicated: Secondary | ICD-10-CM | POA: Diagnosis not present

## 2019-01-24 DIAGNOSIS — F4312 Post-traumatic stress disorder, chronic: Secondary | ICD-10-CM

## 2019-01-24 DIAGNOSIS — F419 Anxiety disorder, unspecified: Secondary | ICD-10-CM

## 2019-01-25 ENCOUNTER — Encounter (HOSPITAL_COMMUNITY): Payer: Self-pay | Admitting: Psychology

## 2019-01-25 NOTE — Progress Notes (Signed)
    Daily Group Progress Note  Program: CD-IOP   01/25/2019 Erin [name] [id number]  Diagnosis:  Alcohol use disorder, severe, dependence (H[facility])  Chronic post-traumatic stress disorder (PTSD)  Chronic anxiety  Stage of Change: Planning, Action  Sobriety Date: 1/22  Group Time: 1-2:30  Participation Level: Active  Behavioral Response: Appropriate and Sharing  Type of Therapy: Process Group  Interventions: CBT and Motivational Interviewing  Topic: PTs were active and engaged in group processing session. Counselor facilitated CBT-based and emotion-focused processing questions to help PTs discuss their recovery from mind-altering drugs/alcohol. Emphasis was placed on PTs disclosing their challenges and successes pertaining to their treatment plan goals.     Counselor facilitated topical discussion using recovery-based topics during PT check-ins. PTs responded well to this and talked openly w/ each other.      Group Time: 2:30-4  Participation Level: Active  Behavioral Response: Appropriate and Sharing  Type of Therapy: Psycho-education Group  Interventions: Meditation: Guided Imagery  Topic: Patient were active and engaged in psychoeducation session in which counselor led a mindfulness guided imagery exercise. PTs were instructed to relax their body, focus on breath, and listen to narration of varying seasons and adjusting to change. PTs had various reactions to this, all stated they were able to attempt to follow instructions.     Summary: PT was active, engaged in session. She was well dressed and well groomed. She stated she attended AA for first time and picked up a starter chip. The group congratulated her. She also received 2 phone numbers of other women in recovery. PT shared a praise for another member who "inspired her". She spoke on topic of "forgiveness". PT discussed her long hx of painful interactions and blame from her sister who views PT as scapegoat.  PT shared that group is "very helpful for her" and she has never processed these feelings w/ others, even past therapists.   UDS collected: No Results: 1/27 positive for Subutex, Gabapentin, negative for THC, reduction in Oxazepam.  AA/NA attended?: YesThursday  Sponsor?: No   Wes Herny Scurlock, LPCA [place] 01/25/2019 12:02 PM

## 2019-01-28 ENCOUNTER — Other Ambulatory Visit (HOSPITAL_COMMUNITY): Payer: Medicare Other | Attending: Psychiatry | Admitting: Psychology

## 2019-01-28 ENCOUNTER — Encounter (HOSPITAL_COMMUNITY): Payer: Self-pay | Admitting: Medical

## 2019-01-28 ENCOUNTER — Other Ambulatory Visit (HOSPITAL_COMMUNITY): Payer: Self-pay | Admitting: Medical

## 2019-01-28 VITALS — BP 132/78 | HR 98 | Ht 67.0 in | Wt 233.0 lb

## 2019-01-28 DIAGNOSIS — Z79899 Other long term (current) drug therapy: Secondary | ICD-10-CM | POA: Diagnosis not present

## 2019-01-28 DIAGNOSIS — K7030 Alcoholic cirrhosis of liver without ascites: Secondary | ICD-10-CM | POA: Diagnosis not present

## 2019-01-28 DIAGNOSIS — F341 Dysthymic disorder: Secondary | ICD-10-CM

## 2019-01-28 DIAGNOSIS — Z6372 Alcoholism and drug addiction in family: Secondary | ICD-10-CM | POA: Diagnosis not present

## 2019-01-28 DIAGNOSIS — Z885 Allergy status to narcotic agent status: Secondary | ICD-10-CM | POA: Diagnosis not present

## 2019-01-28 DIAGNOSIS — Z833 Family history of diabetes mellitus: Secondary | ICD-10-CM | POA: Diagnosis not present

## 2019-01-28 DIAGNOSIS — E039 Hypothyroidism, unspecified: Secondary | ICD-10-CM | POA: Diagnosis not present

## 2019-01-28 DIAGNOSIS — F319 Bipolar disorder, unspecified: Secondary | ICD-10-CM | POA: Insufficient documentation

## 2019-01-28 DIAGNOSIS — F1121 Opioid dependence, in remission: Secondary | ICD-10-CM | POA: Diagnosis not present

## 2019-01-28 DIAGNOSIS — F902 Attention-deficit hyperactivity disorder, combined type: Secondary | ICD-10-CM

## 2019-01-28 DIAGNOSIS — Z888 Allergy status to other drugs, medicaments and biological substances status: Secondary | ICD-10-CM | POA: Diagnosis not present

## 2019-01-28 DIAGNOSIS — Z79890 Hormone replacement therapy (postmenopausal): Secondary | ICD-10-CM | POA: Insufficient documentation

## 2019-01-28 DIAGNOSIS — F1994 Other psychoactive substance use, unspecified with psychoactive substance-induced mood disorder: Secondary | ICD-10-CM

## 2019-01-28 DIAGNOSIS — K1379 Other lesions of oral mucosa: Secondary | ICD-10-CM | POA: Diagnosis not present

## 2019-01-28 DIAGNOSIS — F17210 Nicotine dependence, cigarettes, uncomplicated: Secondary | ICD-10-CM | POA: Insufficient documentation

## 2019-01-28 DIAGNOSIS — F419 Anxiety disorder, unspecified: Secondary | ICD-10-CM

## 2019-01-28 DIAGNOSIS — Z9049 Acquired absence of other specified parts of digestive tract: Secondary | ICD-10-CM | POA: Insufficient documentation

## 2019-01-28 DIAGNOSIS — F1020 Alcohol dependence, uncomplicated: Secondary | ICD-10-CM

## 2019-01-28 DIAGNOSIS — F1920 Other psychoactive substance dependence, uncomplicated: Secondary | ICD-10-CM

## 2019-01-28 DIAGNOSIS — F17200 Nicotine dependence, unspecified, uncomplicated: Secondary | ICD-10-CM

## 2019-01-28 DIAGNOSIS — F4312 Post-traumatic stress disorder, chronic: Secondary | ICD-10-CM | POA: Diagnosis not present

## 2019-01-28 MED ORDER — FLUCONAZOLE 150 MG PO TABS: ORAL_TABLET | ORAL | 0 refills | Status: AC
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
## 2019-01-30 ENCOUNTER — Encounter (HOSPITAL_COMMUNITY): Payer: Self-pay | Admitting: Psychology

## 2019-01-30 ENCOUNTER — Encounter (HOSPITAL_COMMUNITY): Payer: Self-pay

## 2019-01-30 ENCOUNTER — Other Ambulatory Visit (HOSPITAL_COMMUNITY): Payer: Medicare Other

## 2019-01-30 DIAGNOSIS — R7303 Prediabetes: Secondary | ICD-10-CM | POA: Diagnosis not present

## 2019-01-30 DIAGNOSIS — Z720 Tobacco use: Secondary | ICD-10-CM | POA: Diagnosis not present

## 2019-01-30 DIAGNOSIS — E039 Hypothyroidism, unspecified: Secondary | ICD-10-CM | POA: Diagnosis not present

## 2019-01-30 DIAGNOSIS — I10 Essential (primary) hypertension: Secondary | ICD-10-CM | POA: Diagnosis not present

## 2019-01-30 DIAGNOSIS — J4530 Mild persistent asthma, uncomplicated: Secondary | ICD-10-CM | POA: Diagnosis not present
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
## 2019-01-30 NOTE — Progress Notes (Signed)
Erin [name] is a 53 y.o. female patient. CD-IOP: The patient arrived early for group and was engaged and active in process. As the session moved into the psycho-ed, the patient explained she had to go to a doctor's appointment at 3 pm today. She was excused to attend this appointment, but discouraged from scheduling any future appointments during group time. She will not be billed or credited for this session.        Brandon Melnick, LCAS

## 2019-01-31 ENCOUNTER — Other Ambulatory Visit (HOSPITAL_COMMUNITY): Payer: Medicare Other

## 2019-02-02 ENCOUNTER — Ambulatory Visit (HOSPITAL_COMMUNITY): Payer: Medicare Other | Admitting: Psychiatry

## 2019-02-04 ENCOUNTER — Other Ambulatory Visit (HOSPITAL_COMMUNITY): Payer: Medicare Other | Admitting: Psychology

## 2019-02-04 ENCOUNTER — Encounter (HOSPITAL_COMMUNITY): Payer: Self-pay

## 2019-02-04 DIAGNOSIS — F4312 Post-traumatic stress disorder, chronic: Secondary | ICD-10-CM

## 2019-02-04 DIAGNOSIS — F1020 Alcohol dependence, uncomplicated: Secondary | ICD-10-CM

## 2019-02-04 NOTE — Progress Notes (Signed)
    Daily Group Progress Note  Program: CD-IOP   02/04/2019 Erin [name] [id number]  Diagnosis:  Alcohol use disorder, severe, dependence ([place])  Chronic post-traumatic stress disorder (PTSD)  Stage of Change: Planning  Sobriety Date:   Group Time: 1-2:30  Participation Level: Active  Behavioral Response: Appropriate and Attention-Seeking  Type of Therapy: Process Group  Interventions: CBT  Topic: PTs were active and engaged in group processing session. Counselor facilitated CBT-based and emotion-focused processing questions to help PTs discuss their recovery from mind-altering drugs/alcohol. Emphasis was placed on PTs disclosing their challenges and successes pertaining to their treatment plan goals.     One PT met w/ program director for initial visit. UDS results were returned to some members. UDS samples were collected from 4 members.      Group Time: 2:30-4  Participation Level: Active  Behavioral Response: Appropriate and Sharing  Type of Therapy: Psycho-education Group  Interventions: CBT and Psychosocial Skills: Communication  Topic: Patient were active and engaged in psychoeducation session in which counselor led a 1 hour session on Communicaiton. Counselor led an exercised in which PTs drew geometric patterns and practiced communicating w/ each other. PTs were invited to identify their areas of growth regarding their own communication.     Summary: PT was active, engaged, tearful, and agitated in session. She admitted she still feels "extremely triggered all the time" and would "most likely be drinking if she was not in group today". She denies desire to go to bar but admits she misses her friends and feeling that people like her, such as when she went to bar. Counselor challenged these cognitions since she was never sober at the bar. PT reports she had a "pretty good weekend" she attended 1 AA meeting this morning and went to church yesterday. She is  pursuing baptism at her church andis taking special classes for this.   UDS collected: Yes Results: Pending  AA/NA attended?: [facility]  Sponsor?: No  [name], [facility], [place] 02/04/2019 5:24 PM

## 2019-02-05 ENCOUNTER — Telehealth: Payer: Self-pay | Admitting: Internal Medicine

## 2019-02-05 NOTE — Telephone Encounter (Signed)
Moved 4/22 appointments from [facility] to [facility]. Left message. Schedule mailed.

## 2019-02-06 ENCOUNTER — Encounter (HOSPITAL_COMMUNITY): Payer: Self-pay | Admitting: Medical

## 2019-02-06 ENCOUNTER — Other Ambulatory Visit (INDEPENDENT_AMBULATORY_CARE_PROVIDER_SITE_OTHER): Payer: Medicare Other | Admitting: Psychology

## 2019-02-06 DIAGNOSIS — F1920 Other psychoactive substance dependence, uncomplicated: Secondary | ICD-10-CM

## 2019-02-06 DIAGNOSIS — F1121 Opioid dependence, in remission: Secondary | ICD-10-CM

## 2019-02-06 DIAGNOSIS — F1020 Alcohol dependence, uncomplicated: Secondary | ICD-10-CM

## 2019-02-06 DIAGNOSIS — K7030 Alcoholic cirrhosis of liver without ascites: Secondary | ICD-10-CM

## 2019-02-06 DIAGNOSIS — F4312 Post-traumatic stress disorder, chronic: Secondary | ICD-10-CM

## 2019-02-06 DIAGNOSIS — F1994 Other psychoactive substance use, unspecified with psychoactive substance-induced mood disorder: Secondary | ICD-10-CM

## 2019-02-06 DIAGNOSIS — F17200 Nicotine dependence, unspecified, uncomplicated: Secondary | ICD-10-CM

## 2019-02-06 DIAGNOSIS — F341 Dysthymic disorder: Secondary | ICD-10-CM

## 2019-02-06 DIAGNOSIS — K1379 Other lesions of oral mucosa: Secondary | ICD-10-CM

## 2019-02-06 DIAGNOSIS — F902 Attention-deficit hyperactivity disorder, combined type: Secondary | ICD-10-CM

## 2019-02-06 DIAGNOSIS — F419 Anxiety disorder, unspecified: Secondary | ICD-10-CM

## 2019-02-06 DIAGNOSIS — F1210 Cannabis abuse, uncomplicated: Secondary | ICD-10-CM

## 2019-02-06 DIAGNOSIS — E039 Hypothyroidism, unspecified: Secondary | ICD-10-CM

## 2019-02-06 MED ORDER — PENICILLIN V POTASSIUM 500 MG PO TABS: 500.0000 mg | ORAL_TABLET | Freq: Four times a day (QID) | ORAL | 0 refills | Status: AC

## 2019-02-06 NOTE — Progress Notes (Addendum)
Patient ID: Erin [name], female   DOB: [birth year], 53 y.o.   MRN: [id number]     [facility] Behavioral Health Follow-up Outpatient Visit   Date: 02/06/2019  Admission Date: 01/14/2019  Sobriety date: 12/28/2018  Subjective: " I feel really depressed today. I have 2 sores in my mouth from my partial"  HPI :Provider  FU This 1st Provider FU visit for pt in CD IOP. She is also under care of [facility] for MAT with Suboxone for her Opiate dependence. She is having difficulty with her MAT due to sores in her mouth that she now believes are caused by her partial. She would like an antibiotic for her mouth sores.  She continues to take her discharge Psychiatric medications.She admits her mood today is reactive and that her baseline Depression is managed with current medication. She is a long time addict and "knows" a lot but has not acquired the skill of life ODAT.  Review of Systems: Psychiatric: Agitation: Has lived in chaos most of her life             Hallucination: No Depressed Mood: Per HPI Insomnia: Takes medication Hypersomnia: No Altered Concentration: Rx [place] Feels Worthless: Chronic low self esteem Grandiose Ideas: No Belief In Special Powers: No New/Increased Substance Abuse: No Compulsions: No  Neurologic: Headache: No Seizure: No Paresthesias: No  Mouth- Bilateral ulcerations lower alveolar sulcus from canines back to 1st molar areaconsistent with partial plate location  Current Medications: Current Outpatient Medications  Medication Sig Dispense Refill  . atomoxetine (STRATTERA) 100 MG capsule atomoxetine 100 mg capsule  TAKE ONE CAPSULE BY MOUTH EVERY DAY FOR ADHD    . budesonide-formoterol (SYMBICORT) 160-4.5 MCG/ACT inhaler Symbicort 160 mcg-4.5 mcg/actuation HFA aerosol inhaler    . buprenorphine (SUBUTEX) 8 MG SUBL SL tablet PLACE ONE TABLET UNDER THE TONGUE EVERY EIGHT HOURS    . Buprenorphine HCl-Naloxone HCl 8.6-2.1 MG SUBL Place 2 strips  under the tongue 2 (two) times daily at 10 AM and 5 PM.    . buPROPion (WELLBUTRIN SR) 150 MG 12 hr tablet Take 150 mg by mouth every morning.    Marland Kitchen buPROPion (WELLBUTRIN XL) 150 MG 24 hr tablet 1 tab(s) every 24 hours orally 30 day(s)    . cyanocobalamin (,VITAMIN B-12,) 1000 MCG/ML injection 1000 mcg once a month intramuscularly 30 day(s)    . DULoxetine (CYMBALTA) 60 MG capsule Take 1 capsule (60 mg total) by mouth daily. For depression 30 capsule 0  . fluconazole (DIFLUCAN) 150 MG tablet Take 1 daily for 3 days 3 tablet 0  . fluticasone (FLONASE) 50 MCG/ACT nasal spray 1 spray(s) once a day intranasally 30 day(s)    . folic acid (FOLVITE) 1 MG tablet 1 tab(s) once a day orally 30 day(s)    . gabapentin (NEURONTIN) 300 MG capsule Take 300 mg by mouth 3 (three) times daily.    Marland Kitchen lactulose (CHRONULAC) 10 GM/15ML solution Take 30 mLs (20 g total) by mouth 3 (three) times daily. (May buy from over there counter): For constipation/ammonia control 473 mL 1  . levothyroxine (SYNTHROID, LEVOTHROID) 50 MCG tablet Take 1 tablet (50 mcg total) by mouth daily before breakfast. For low functioning thyroid 7 tablet 0  . losartan (COZAAR) 25 MG tablet 1 tab(s) once a day orally 30 day(s)    . mirtazapine (REMERON) 30 MG tablet Take 1 tablet (30 mg) by mouth daily at bedtime    . omeprazole (PRILOSEC) 20 MG capsule omeprazole 20 mg capsule,delayed release    .  ondansetron (ZOFRAN-ODT) 4 MG disintegrating tablet 1 tab(s) 3 times a day orally 5 days    . penicillin v potassium (VEETID) 500 MG tablet Take 1 tablet (500 mg total) by mouth 4 (four) times daily for 7 days. 28 tablet 0  . pneumococcal 23 valent vaccine (PNU-IMMUNE) 25 MCG/0.5ML injection Pneumovax 23 25 mcg/0.5 mL injection solution  inject 0.37ml INTRAMUSCUALRY    . polyethylene glycol powder (GLYCOLAX/MIRALAX) powder polyethylene glycol 3350 17 gram/dose oral powder    . prazosin (MINIPRESS) 5 MG capsule Take 1 capsule at bedtime for nightmares.     Marland Kitchen PROAIR HFA 108 (90 Base) MCG/ACT inhaler     . REXULTI 2 MG TABS     . traZODone (DESYREL) 100 MG tablet Take 1 tablet (100 mg total) by mouth at bedtime as needed for sleep. 30 tablet 0  . VRAYLAR capsule     . ZUBSOLV 5.7-1.4 MG SUBL place ONE film UNDER THE TONGUE EVERY 12 HOURS     No current facility-administered medications for this visit.    Mental Status Examination  Appearance:Well Groomed(went to hairdresser-complimeneted) Alert: Yes Attention: good  Cooperative: Yes Eye Contact: Good Speech: Clear and coherent Psychomotor Activity: Normal Memory/Concentration: Normal/intact Oriented: person, place, time/date and situation Mood: Varies WNL?[place] of Knowledge:  Thought Content: WDL Insight: SOME Judgement: LIMITED  UDS: negative  Diagnosis:  1. Alcohol use disorder, severe, dependence ([place]) F10.20   2. Uncomplicated opioid dependence ([place]) F11.20   3. Cannabis abuse, daily use F12.10   4. Chronic post-traumatic stress disorder (PTSD) F43.12   5. Substance or medication-induced bipolar and related disorder ([place]) F19.94   6. Dysthymia (or depressive neurosis) F34.1   7. Chronic anxiety F41.9   8. TOBACCO DEPENDENCE F17.200   9. Alcoholic cirrhosis, unspecified whether ascites present ([place]) K70.30   10. Severe obesity (BMI 35.0-39.9) with comorbidity ([place]) E66.01   11. Attention deficit hyperactivity disorder (ADHD), combined type F90.2   12. Acquired hypothyroidism E03.9   13. Polysubstance abuse ([place]) Cocaine ;Benzos;Hallucinogens F19.10     14  M[place]                                                K 13.79           Assessment: LONG TIME ADDICT   COMMITTED TO RECOVERY STRUGGLing at times                                                                                                  Treatment Plan: per initial eval                               RX PnVK                              FU  2 wks/sooner if needed  Counseled on living 1 day at a time psychologically/focusing on today and what                                        needs to be done and turning over everything else to her Higher Power for a                               later day Darlyne Russian, PA-C

## 2019-02-07 ENCOUNTER — Other Ambulatory Visit (HOSPITAL_COMMUNITY): Payer: Medicare Other | Admitting: Psychology

## 2019-02-07 DIAGNOSIS — M5136 Other intervertebral disc degeneration, lumbar region: Secondary | ICD-10-CM | POA: Diagnosis not present

## 2019-02-07 DIAGNOSIS — M5030 Other cervical disc degeneration, unspecified cervical region: Secondary | ICD-10-CM | POA: Diagnosis not present

## 2019-02-07 DIAGNOSIS — F1020 Alcohol dependence, uncomplicated: Secondary | ICD-10-CM

## 2019-02-07 DIAGNOSIS — F4312 Post-traumatic stress disorder, chronic: Secondary | ICD-10-CM

## 2019-02-07 DIAGNOSIS — F1121 Opioid dependence, in remission: Secondary | ICD-10-CM

## 2019-02-07 NOTE — Addendum Note (Signed)
Addended by: Dara Hoyer on: 02/07/2019 01:55 PM   Modules accepted: Orders

## 2019-02-08 ENCOUNTER — Encounter (HOSPITAL_COMMUNITY): Payer: Self-pay | Admitting: Psychology
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
## 2019-02-11 ENCOUNTER — Other Ambulatory Visit (HOSPITAL_COMMUNITY): Payer: Medicare Other | Admitting: Psychology

## 2019-02-11 ENCOUNTER — Ambulatory Visit (HOSPITAL_COMMUNITY): Payer: Medicare Other | Admitting: Psychiatry

## 2019-02-11 DIAGNOSIS — F1121 Opioid dependence, in remission: Secondary | ICD-10-CM

## 2019-02-11 DIAGNOSIS — F1020 Alcohol dependence, uncomplicated: Secondary | ICD-10-CM

## 2019-02-11 DIAGNOSIS — F4312 Post-traumatic stress disorder, chronic: Secondary | ICD-10-CM

## 2019-02-11 DIAGNOSIS — F419 Anxiety disorder, unspecified: Secondary | ICD-10-CM

## 2019-02-12 ENCOUNTER — Encounter (HOSPITAL_COMMUNITY): Payer: Self-pay | Admitting: Psychology

## 2019-02-12 NOTE — Progress Notes (Signed)
    Daily Group Progress Note  Program: CD-IOP   02/12/2019 Erin [name] [id number]  Diagnosis: Alcohol Use Disorder, Severe, Opioid Use Disorder, Severe, Chronic PTSD, Cirrhosis  Sobriety Date: 1/30  Group Time: 1-2:30pm  Participation Level: Active  Behavioral Response: Sharing  Type of Therapy: Process Group  Interventions: Supportive  Topic: Process: The first part of group began with process. Group members shared their experiences in early recovery since we last met on Wednesday.  A popsicle stick check-in intervention was used to help group members focus their shares on topics related to recovery. One drug test was collected. All group members were present but one group member left early to pick her children up from school.   Group Time: 2:30-4pm  Participation Level: Active  Behavioral Response: Sharing and Passive-Aggressive  Type of Therapy: Psycho-education Group  Interventions: Assertiveness Training  Topic: Psycho-ed: Administrator, arts; The second half of group was spent in psycho-ed practicing different communication styles. Group members watched a short video clip providing examples of the different types of communication. The video clip focused particularly on passive aggressive communication since the group had questions about this communication style. After watching the clip, group members engaged in a role-play exercise where they were given different scenarios and asked to first practice passive or passive aggression communication and then assertive communication.   Summary: The patient reported she had attended no AA meetings since we last met yesterday on Wednesday. During the popsicle stick check in activity, the patient choose to share about a "harsh" comment that made the previous day in group. The patient shared that she felt attacked by another group member who made comments that when the other patient relapsed, they needed to tell themselves  to "get off her ass and go do what you're supposed to do."  The patient felt like this comment was passive aggressive in nature and was aimed at them. The patient expressed feeling attacked and processed her hurt feelings. During the psycho-ed portion of group, the patient offered supportive feedback to her fellow group members about how to improve communication so it was more assertive. The patient asked questions about passive aggressive communication and using assertive communication to set boundaries.   UDS collected: No  AA/NA attended?: No  Sponsor?: No   Brandon Melnick, LCAS 02/12/2019 8:13 AM

## 2019-02-13 ENCOUNTER — Other Ambulatory Visit (HOSPITAL_COMMUNITY): Payer: Medicare Other | Admitting: Psychology

## 2019-02-13 DIAGNOSIS — F1121 Opioid dependence, in remission: Secondary | ICD-10-CM

## 2019-02-13 DIAGNOSIS — F1020 Alcohol dependence, uncomplicated: Secondary | ICD-10-CM

## 2019-02-13 DIAGNOSIS — K7030 Alcoholic cirrhosis of liver without ascites: Secondary | ICD-10-CM

## 2019-02-13 DIAGNOSIS — F4312 Post-traumatic stress disorder, chronic: Secondary | ICD-10-CM

## 2019-02-14 ENCOUNTER — Other Ambulatory Visit (HOSPITAL_COMMUNITY): Payer: Medicare Other | Admitting: Psychology

## 2019-02-14 DIAGNOSIS — F1020 Alcohol dependence, uncomplicated: Secondary | ICD-10-CM

## 2019-02-14 DIAGNOSIS — M25551 Pain in right hip: Secondary | ICD-10-CM | POA: Diagnosis not present

## 2019-02-14 DIAGNOSIS — F1121 Opioid dependence, in remission: Secondary | ICD-10-CM

## 2019-02-14 DIAGNOSIS — M25552 Pain in left hip: Secondary | ICD-10-CM | POA: Diagnosis not present

## 2019-02-14 DIAGNOSIS — K7030 Alcoholic cirrhosis of liver without ascites: Secondary | ICD-10-CM

## 2019-02-15 ENCOUNTER — Encounter (HOSPITAL_COMMUNITY): Payer: Self-pay | Admitting: Psychology
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
## 2019-02-15 NOTE — Progress Notes (Signed)
    Daily Group Progress Note  Program: CD-IOP   02/15/2019 FLORIS NEUHAUS [id number]  Diagnosis:  Alcohol use disorder, severe, dependence ([place])  Opioid use disorder, severe, in early remission, on maintenance therapy, dependence ([facility])  Chronic post-traumatic stress disorder (PTSD)  Chronic anxiety  Stage of Change: Planning, Action  Sobriety Date: 1/22  Group Time: 1-2:30  Participation Level: Active  Behavioral Response: Appropriate and Sharing  Type of Therapy: Process Group  Interventions: CBT and Motivational Interviewing  Topic: PTs were active and engaged in group processing session. Counselor facilitated CBT-based and emotion-focused processing questions to help PTs discuss their recovery from mind-altering drugs/alcohol. Emphasis was placed on PTs disclosing their challenges and successes pertaining to their treatment plan goals.           Group Time: 2:30-4  Participation Level: Active  Behavioral Response: Appropriate and Sharing  Type of Therapy: Psycho-education Group  Interventions: Other: Emotional Regulation, DBT  Topic: Patient were active and engaged in psychoeducation session in which counselor led a 1 hour session on Emotional Regulation and increasing emotional vocabulary. PTs played a game of [place] w/ emotions written on blocks and were asked to identify feelings and show examples of the feeling.     Summary: PT was active, engaged, more lucid than previous sessions. She reported attending 2 AA meetings since last group. She told the group she plans to get a sponsor by next Monday 2/24. PT continues to report fear of relapsing when she leaves this program. Continues to catastrophize and make excuses as to why she cannot change her behaviors. A UDS was collected and results came back negative. PT went swimming today and felt good about that. She shows interest in joining a grief group to process numerous losses.    UDS collected: Yes  Results: negative  AA/NA attended?: YesMonday and Saturday  Sponsor?: No  [name], [place] 02/15/2019 12:52 PM

## 2019-02-18 ENCOUNTER — Other Ambulatory Visit (HOSPITAL_COMMUNITY): Payer: Medicare Other | Admitting: Psychology

## 2019-02-18 DIAGNOSIS — F1020 Alcohol dependence, uncomplicated: Secondary | ICD-10-CM | POA: Diagnosis not present

## 2019-02-18 DIAGNOSIS — F1121 Opioid dependence, in remission: Secondary | ICD-10-CM

## 2019-02-18 DIAGNOSIS — F4312 Post-traumatic stress disorder, chronic: Secondary | ICD-10-CM

## 2019-02-18 DIAGNOSIS — F419 Anxiety disorder, unspecified: Secondary | ICD-10-CM

## 2019-02-19 ENCOUNTER — Encounter (HOSPITAL_COMMUNITY): Payer: Self-pay | Admitting: Psychology

## 2019-02-19 DIAGNOSIS — I10 Essential (primary) hypertension: Secondary | ICD-10-CM | POA: Diagnosis not present

## 2019-02-19 DIAGNOSIS — K219 Gastro-esophageal reflux disease without esophagitis: Secondary | ICD-10-CM | POA: Diagnosis not present

## 2019-02-19 DIAGNOSIS — J4530 Mild persistent asthma, uncomplicated: Secondary | ICD-10-CM | POA: Diagnosis not present

## 2019-02-19 DIAGNOSIS — Z720 Tobacco use: Secondary | ICD-10-CM | POA: Diagnosis not present

## 2019-02-19 DIAGNOSIS — R7303 Prediabetes: Secondary | ICD-10-CM | POA: Diagnosis not present

## 2019-02-19 NOTE — Progress Notes (Signed)
    Daily Group Progress Note  Program: CD-IOP   02/19/2019 Erin [name] [id number]  Diagnosis: Opioid Use Disorder, Severe, Chronic PTSD  Sobriety Date: 01/16/19  Group Time: 1-2:30pm  Participation Level: Active  Behavioral Response: Sharing, justifying and complaining  Type of Therapy: Process Group  Interventions: Supportive  Topic: Process: The first part of group of began with process. Group members shared their experiences in early recovery since we last met on Wednesday. All group members were present today.  Group Time: 2:30-4pm  Participation Level: Active  Behavioral Response: Sharing  Type of Therapy: Psycho-education Group  Interventions: Psychosocial Skills: Vulnerability  Topic: Psycho-ed: Vulnerability Video; The second half of group was spent in psycho-ed discussing the importance of Vulnerability. Group members were shown a TED talk given by Almond Lint on Vulnerability as the key to human connection. After the video was over, group members talked about their thoughts on video. Group members focused on topics such as shame preventing vulnerability, numbing uncomfortable emotions, and learning to accept themselves.   Summary: The patient reported that she had attended no AA meetings since we last met yesterday on Wednesday. The patient reported that she went to church last night. The patient shared that she "dreads" weekends because she is all alone. The patient shared that she does not like being stuck in her house alone. She was worried that the weather would be bad this weekend and would keep her from being able to get out. The patient shared "with my luck, I'll be stuck in the house." The patient was challenged by the group to find ways to entertain herself. The group was supportive of the patient filling her weekends with AA activities and being involved in her church. The patient also shared with the group that she "misses marijuana" because she misses  the feeling of being "calm" associated with getting high. The patient reported that she has been experiencing a lot of anxiety the past few days. During the psycho-ed portion of group, the patient was feeling tired and did not talk as much as she normally does. The patient shared that she enjoyed the video and is working on becoming more vulnerable by being here in group as well as in [employer] and with her closest family and friends.      UDS collected: No   AA/NA attended?: No{  Sponsor?: No   Brandon Melnick, LCAS 02/19/2019 8:18 AM

## 2019-02-19 NOTE — Progress Notes (Signed)
    Daily Group Progress Note  Program: CD-IOP   02/19/2019 Erin [name] [id number]  Diagnosis:  Alcohol use disorder, severe, dependence ([place])  Opioid use disorder, severe, in early remission, on maintenance therapy, dependence ([facility])  Chronic post-traumatic stress disorder (PTSD)  Chronic anxiety  Stage of Change: Planning, Action  Sobriety Date: 1/22  Group Time: 1-2:30  Participation Level: Active  Behavioral Response: Appropriate and Sharing  Type of Therapy: Process Group  Interventions: CBT  Topic: PTs were active and engaged in group processing session. Counselor facilitated CBT-based and emotion-focused processing questions to help PTs discuss their recovery from mind-altering drugs/alcohol. Emphasis was placed on PTs disclosing their challenges and successes pertaining to their treatment plan goals.  One new member was present and met w/ medical director to establish care. One member graduated successfully from group and met w/ Market researcher for d/c. UDS samples were collected from 5 group members. One member had relapsed over weekend and felt ashamed. Group offered suppportive feedback.       Group Time: 2:30-4  Participation Level: Active  Behavioral Response: Appropriate and Sharing  Type of Therapy: Psycho-education Group  Interventions: CBT  Topic: Patient were active and engaged in psychoeducation session in which counselor led a 1 hour session on "Realistic Self Talk", provided a handout on improving self talk during crisis and anxiety. PTs shared their favorite responses and practiced out loud.    Summary: Pt was active, engaged, upbeat, and well-groomed. She wore makeup and her hair was clean. She attended 3 AA meetings over weekend after being confronted last week by group that she was "playing victim and not going to enough meetings". PT stated the meetings were positive, she picked up her 30 day sober chip. PT also identified a new  sober meeting she wants to attend at her church on Wednesday nights. PT shared that she is opening up w/ people about her recovery and not feeling so ashamed.   UDS collected: Yes Results: Pending, most recent 2/17 negative  AA/NA attended?: YesFriday, Saturday and Sunday  Sponsor?: No   [name], [facility], LCASA 02/19/2019 3:15 PM

## 2019-02-20 ENCOUNTER — Other Ambulatory Visit (HOSPITAL_COMMUNITY): Payer: Medicare Other | Admitting: Psychology

## 2019-02-20 DIAGNOSIS — K7030 Alcoholic cirrhosis of liver without ascites: Secondary | ICD-10-CM

## 2019-02-20 DIAGNOSIS — F1020 Alcohol dependence, uncomplicated: Secondary | ICD-10-CM

## 2019-02-20 DIAGNOSIS — F4312 Post-traumatic stress disorder, chronic: Secondary | ICD-10-CM

## 2019-02-20 DIAGNOSIS — F1121 Opioid dependence, in remission: Secondary | ICD-10-CM

## 2019-02-21 ENCOUNTER — Other Ambulatory Visit (HOSPITAL_COMMUNITY): Payer: Medicare Other | Admitting: Psychology

## 2019-02-21 DIAGNOSIS — F1020 Alcohol dependence, uncomplicated: Secondary | ICD-10-CM | POA: Diagnosis not present

## 2019-02-21 DIAGNOSIS — F1121 Opioid dependence, in remission: Secondary | ICD-10-CM

## 2019-02-21 DIAGNOSIS — K7030 Alcoholic cirrhosis of liver without ascites: Secondary | ICD-10-CM

## 2019-02-21 DIAGNOSIS — F4312 Post-traumatic stress disorder, chronic: Secondary | ICD-10-CM

## 2019-02-22 ENCOUNTER — Encounter (HOSPITAL_COMMUNITY): Payer: Self-pay | Admitting: Licensed Clinical Social Worker

## 2019-02-22 ENCOUNTER — Encounter (HOSPITAL_COMMUNITY): Payer: Self-pay | Admitting: Psychology

## 2019-02-22 DIAGNOSIS — F1121 Opioid dependence, in remission: Secondary | ICD-10-CM

## 2019-02-22 DIAGNOSIS — F1020 Alcohol dependence, uncomplicated: Secondary | ICD-10-CM

## 2019-02-22 DIAGNOSIS — F4312 Post-traumatic stress disorder, chronic: Secondary | ICD-10-CM

## 2019-02-22 DIAGNOSIS — F419 Anxiety disorder, unspecified: Secondary | ICD-10-CM

## 2019-02-22 NOTE — Progress Notes (Signed)
    Daily Group Progress Note  Program: CD-IOP   02/22/2019 Erin [name] [id number]  Diagnosis: Opioid Use Disorder, Severe, Alcohol Use disorder, Severe, Alcoholic Cirrhosis of the liver, Chronic PTSD  Sobriety Date: 01/16/19  Group Time: 1-2:30pm  Participation Level: Active  Behavioral Response: Sharing  Type of Therapy: Process Group  Interventions: Supportive  Topic: Process: The first half of group was spent in process. Members shared about any challenges ort struggles in early recovery as well as any successes. They discussed meetings they had attended, increased understanding through sponsor and step work along with just gaining more days of sobriety. A nerw group member was present and he introduced himself during this half of group. Three drug tests were collected.   Group Time: 2:30-4pm  Participation Level: Active  Behavioral Response: Appropriate  Type of Therapy: Psycho-education Group  Interventions: Strength-based  Topic: Psycho-education: Neurobiology of Addiction. The second half of group consisted of a brief YouTube and then slide show on the neurobiology of addiction. The biological nature of this disease was emphasized along with the power of the limbic system in overriding the pre-frontal cortex. Members shared in discussion about their own experiences and seemingly "out of control". Others agreed identifying the incongruence of behaviors while impaired and their value system. Members were engaged and active in this session.   Summary: The patient reported she had gone to two meetings since we last met. She had seen two other fellow group members there. the patient reported she had found a "loaded crack pipe". It was confusing as to how it had come into her possession. The patient explained that she had thrown it away and "got the thought out of my head". Members expressed concerns about her and what she might have done to challenge any thoughts of  using. when asked if she had phoned her temporary sponsor, the patient denied having called her. She admitted that "I'm glad I'm talking about it now" but seemed unaware of how this could have been a disaster for her recovery. In the psycho-ed on the biological nature of addiction, she had some good questions. The patient provided supportive feedback to her fellow group members and complimented a fellow group member on her appearance. She continues to make measurable progress in early recovery.   UDS collected: No   AA/NA attended?: YesTuesday and Wednesday  Sponsor?: Yes   Brandon Melnick, LCAS 02/22/2019 9:44 AM

## 2019-02-22 NOTE — Progress Notes (Signed)
   THERAPIST PROGRESS NOTE  Session Time: 12-1pm  Participation Level: Active  Behavioral Response: Well GroomedAlertAnxious, Depressed and Tearful  Type of Therapy: Individual Therapy  Treatment Goals addressed: CD-IOP TREATMENT PLAN  Interventions: CBT and Motivational Interviewing  Summary: Erin [name] is a 53 y.o. female who presents with long hx of Substance Use Disorders, Severe, Anxiety, MDD, and PTSD. She is active, engaged, makes strong eye contact, and is tearful throughout session. She expresses guilt/shame for her addiction and her lack of being able to take care of her children well. She appears guarded and agitated. She expresses strong feelings of fear of abandonment. Counselor and PT discuss treatment plan. PT discussed goals of treatment.   Suicidal/Homicidal: Nowithout intent/plan  Therapist Response: Counselor used open questions, active listening, and targeted goal-setting. Counselor discussed relapse prevention and avoidance of triggers. Counselor used relational techniques to build rapport and encourage self disclosure.   Plan: Continue in CD-IOP for 5 weeks.  Diagnosis:    ICD-10-CM   1. Alcohol use disorder, severe, dependence ([place]) F10.20   2. Opioid use disorder, severe, in early remission, on maintenance therapy, dependence ([place]) F11.21   3. Chronic post-traumatic stress disorder (PTSD) F43.12   4. Chronic anxiety F41.9        Archie Balboa, LCAS-A 02/22/2019

## 2019-02-25 ENCOUNTER — Other Ambulatory Visit (HOSPITAL_COMMUNITY): Payer: Medicare Other | Attending: Psychiatry | Admitting: Psychology

## 2019-02-25 DIAGNOSIS — F17200 Nicotine dependence, unspecified, uncomplicated: Secondary | ICD-10-CM | POA: Insufficient documentation

## 2019-02-25 DIAGNOSIS — Z813 Family history of other psychoactive substance abuse and dependence: Secondary | ICD-10-CM | POA: Insufficient documentation

## 2019-02-25 DIAGNOSIS — F902 Attention-deficit hyperactivity disorder, combined type: Secondary | ICD-10-CM | POA: Diagnosis not present

## 2019-02-25 DIAGNOSIS — F4312 Post-traumatic stress disorder, chronic: Secondary | ICD-10-CM | POA: Diagnosis not present

## 2019-02-25 DIAGNOSIS — F419 Anxiety disorder, unspecified: Secondary | ICD-10-CM | POA: Diagnosis not present

## 2019-02-25 DIAGNOSIS — F1020 Alcohol dependence, uncomplicated: Secondary | ICD-10-CM

## 2019-02-25 DIAGNOSIS — Z62810 Personal history of physical and sexual abuse in childhood: Secondary | ICD-10-CM | POA: Diagnosis not present

## 2019-02-25 DIAGNOSIS — K1370 Unspecified lesions of oral mucosa: Secondary | ICD-10-CM | POA: Diagnosis not present

## 2019-02-25 DIAGNOSIS — F1121 Opioid dependence, in remission: Secondary | ICD-10-CM | POA: Diagnosis not present

## 2019-02-25 DIAGNOSIS — F341 Dysthymic disorder: Secondary | ICD-10-CM | POA: Insufficient documentation

## 2019-02-25 DIAGNOSIS — K7030 Alcoholic cirrhosis of liver without ascites: Secondary | ICD-10-CM | POA: Diagnosis not present

## 2019-02-25 DIAGNOSIS — F1994 Other psychoactive substance use, unspecified with psychoactive substance-induced mood disorder: Secondary | ICD-10-CM | POA: Insufficient documentation

## 2019-02-25 DIAGNOSIS — E039 Hypothyroidism, unspecified: Secondary | ICD-10-CM | POA: Diagnosis not present

## 2019-02-25 DIAGNOSIS — Z79890 Hormone replacement therapy (postmenopausal): Secondary | ICD-10-CM | POA: Insufficient documentation

## 2019-02-25 DIAGNOSIS — Z79899 Other long term (current) drug therapy: Secondary | ICD-10-CM | POA: Insufficient documentation

## 2019-02-26 ENCOUNTER — Encounter (HOSPITAL_COMMUNITY): Payer: Self-pay | Admitting: Psychology

## 2019-02-26 NOTE — Progress Notes (Signed)
    Daily Group Progress Note  Program: CD-IOP   02/26/2019 Erin [name] [id number]  Diagnosis: Opioid Use Disorder, Severe, Alcohol Use Disorder, Severe, Chronic PTSD  Sobriety Date: 01/16/19  Group Time: 1-2:30pm  Participation Level: Active  Behavioral Response: Sharing  Type of Therapy: Process Group  Interventions: Supportive  Topic: Process: The first part of group began with process. Group members shared their experiences in early recovery since we last met on Wednesday. One group member was absent because she was attending a funeral. Two drug tests were collected.   Group Time: 2:30-4pm  Participation Level: Active  Behavioral Response: Sharing  Type of Therapy: Psycho-education Group  Interventions: CBT  Topic: Psycho-ed: CBT Model; Correcting Irrational Thoughts: The second half of group was spent in psycho-ed discussing the Cognitive Behavioral Therapy model for correcting irrational thoughts. A worksheet was handed out explaining what irrational thoughts are and how they lead to negative emotions and behaviors. Group members then practiced the CBT model by finding alternative thoughts to their irrational thoughts.   Summary: The patient reported that she had attended no AA meetings since we last met on Wednesday. The patient shared that she attempted to attend the celebrate recovery meeting but was unable to find where the group was meeting. The patient reported that she bought a $5 plate of food and left. The patient reported that this morning, she woke up and cleaned her house. The patient wants to redecorate and is doing "out with the old and in with the new." During the psycho ed portion of group, the patient did well with the CBT activity. She chose to correct the negative thoughts around a situation where her landlord has not yet fixed her kitchen cabinets. The patient seemed to understand the benefit of this activity and its usefulness for long term  recovery.    UDS collected: No  AA/NA Attended: No  Sponsor?: Yes   Brandon Melnick, LCAS 02/26/2019 5:35 PM

## 2019-02-27 ENCOUNTER — Encounter (HOSPITAL_COMMUNITY): Payer: Self-pay

## 2019-02-27 ENCOUNTER — Encounter (HOSPITAL_COMMUNITY): Payer: Self-pay | Admitting: Medical

## 2019-02-27 ENCOUNTER — Encounter (HOSPITAL_COMMUNITY): Payer: Self-pay | Admitting: Psychology

## 2019-02-27 ENCOUNTER — Other Ambulatory Visit (HOSPITAL_COMMUNITY): Payer: Medicare Other | Admitting: Medical

## 2019-02-27 NOTE — Progress Notes (Signed)
Patient ID: Erin [name], female   DOB: 03/02/1966, 53 y.o.   MRN: [id number] Pt scheduled for provider FU but left group c/o of feelinh ill "shakey"..Will see Monday 3/9

## 2019-02-27 NOTE — Progress Notes (Signed)
Erin [name] is a 53 y.o. female patient. CD-IOP: The patient appeared for group today, but reported that she was not feeling well. She felt shaky and weak. The counselor encouraged her to stay, tell the group she was not feeling well and be present. She completed a drug test as scheduled. However, the patient opted not to stay. She apologized to the group, but left shortly after the session started. She will be excused from the session. We will hope to see her tomorrow.         Brandon Melnick, LCAS

## 2019-02-28 ENCOUNTER — Encounter (HOSPITAL_COMMUNITY): Payer: Self-pay | Admitting: Psychology

## 2019-02-28 ENCOUNTER — Other Ambulatory Visit (HOSPITAL_COMMUNITY): Payer: Medicare Other | Admitting: Psychology

## 2019-02-28 DIAGNOSIS — F419 Anxiety disorder, unspecified: Secondary | ICD-10-CM

## 2019-02-28 DIAGNOSIS — Z79899 Other long term (current) drug therapy: Secondary | ICD-10-CM | POA: Diagnosis not present

## 2019-02-28 DIAGNOSIS — E785 Hyperlipidemia, unspecified: Secondary | ICD-10-CM | POA: Diagnosis not present

## 2019-02-28 DIAGNOSIS — F1020 Alcohol dependence, uncomplicated: Secondary | ICD-10-CM | POA: Diagnosis not present

## 2019-02-28 DIAGNOSIS — F4312 Post-traumatic stress disorder, chronic: Secondary | ICD-10-CM

## 2019-02-28 DIAGNOSIS — I10 Essential (primary) hypertension: Secondary | ICD-10-CM | POA: Diagnosis not present

## 2019-02-28 DIAGNOSIS — Z1159 Encounter for screening for other viral diseases: Secondary | ICD-10-CM | POA: Diagnosis not present

## 2019-02-28 DIAGNOSIS — D869 Sarcoidosis, unspecified: Secondary | ICD-10-CM | POA: Diagnosis not present

## 2019-02-28 NOTE — Progress Notes (Addendum)
    Daily Group Progress Note  Program: CD-IOP   03/05/2019 Erin [name] [id number]  Diagnosis:  Alcohol use disorder, severe, dependence ([place])  Chronic post-traumatic stress disorder (PTSD)  Chronic anxiety  Stage of Change: Planning, Action  Sobriety Date: 1/22  Group Time: 1-2:30  Participation Level: Active  Behavioral Response: Appropriate and Sharing  Type of Therapy: Process Group  Interventions: CBT  Topic: Process: The first part of group began with process. Group members shared their experiences in early recovery since we last met on Wednesday. Two group members were absent. One group member was on vacation while another group member got confused about the days of the week CD-IOP meets. No drug tests were collected.      Group Time: 2:30-4  Participation Level: Active  Behavioral Response: Appropriate and Sharing  Type of Therapy: Psycho-education Group  Interventions: CBT  Topic: Psycho-ed: Internal Triggers; The second part of group was spent in psycho-ed discussing internal triggers for use. Group members were encouraged to first discuss the difference between external and internal triggers. A handout was passed around with a list of emotional states. Group members were instructed to place a check by all the emotions that are internal triggers for using. Group members were instructed to then select their three most triggering emotional states. Group members processed ways of coping with these challenging emotional states with the group.    Summary: The patient reported that they had attended one [place] meeting since we last met on Wednesday. The patient reported to the group that she "does not feel better" and is still feeling "sick." The patient shared that she attended an [place] meeting this morning but had to leave early to attend a doctors appointment. The patient reported to the group that her blood pressure was low this morning, but is otherwise okay.  The patient processed feelings of hard emotions resurfacing in the group. Group members validated the patient's emotional experience and let her know that sometimes there is no reason behind the tears. During the psycho-ed portion of group, the patient shared that her biggest internal triggers were feelings of anger, nervousness, and sexual arousal.    UDS collected: Yes Results: negative  AA/NA attended?: YesThursday  Sponsor?: Yes   [name], [facility], [place] 03/05/2019 3:14 PM

## 2019-02-28 NOTE — Progress Notes (Signed)
    Daily Group Progress Note  Program: CD-IOP   02/28/2019 ELECTA STERRY [id number]  Diagnosis:  Alcohol use disorder, severe, dependence ([facility])  Chronic post-traumatic stress disorder (PTSD)  Chronic anxiety  Stage of Change: Planning, Action  Sobriety Date: 1/22  Group Time: 1-2:30  Participation Level: Active  Behavioral Response: Appropriate and Sharing  Type of Therapy: Process Group  Interventions: CBT  Topic: PTs were active and engaged in group processing session. Counselor facilitated CBT-based and emotion-focused processing questions to help PTs discuss their recovery from mind-altering drugs/alcohol. Emphasis was placed on PTs disclosing their challenges and successes pertaining to their treatment plan goals.      Group Time: 2:30-4  Participation Level: Active  Behavioral Response: Appropriate and Sharing  Type of Therapy: Psycho-education Group  Interventions: Meditation: Chair Yoga  Topic: Patients were led in a 1 hour chair yoga session. Pts were guided in various positions that can be adapted for relaxation at work, home, and on the go.    Summary: Pt was attentive, engaged, well-groomed, and upbeat in session. She smiled openly. She showed off her new smart phone and asked for help w/ how to set it up w/ recovery-based apps. PT attended 1 AA meeting over weekend. She continues to be compliant on her medications. She stated the yoga was "very relaxing" .   UDS collected: Yes Results: negative  AA/NA attended?: [facility]  Sponsor?: No   [name], [facility], [place] 02/28/2019 6:29 PM

## 2019-03-04 ENCOUNTER — Other Ambulatory Visit (HOSPITAL_COMMUNITY): Payer: Medicare Other | Admitting: Psychology

## 2019-03-04 DIAGNOSIS — F1020 Alcohol dependence, uncomplicated: Secondary | ICD-10-CM | POA: Diagnosis not present

## 2019-03-04 DIAGNOSIS — F4312 Post-traumatic stress disorder, chronic: Secondary | ICD-10-CM

## 2019-03-04 DIAGNOSIS — F1121 Opioid dependence, in remission: Secondary | ICD-10-CM

## 2019-03-04 DIAGNOSIS — F419 Anxiety disorder, unspecified: Secondary | ICD-10-CM

## 2019-03-05 ENCOUNTER — Encounter (HOSPITAL_COMMUNITY): Payer: Self-pay

## 2019-03-05 NOTE — Progress Notes (Signed)
    Daily Group Progress Note  Program: CD-IOP   03/05/2019 AZADEH HYDER [id number]  Diagnosis:  Alcohol use disorder, severe, dependence ([facility])  Chronic post-traumatic stress disorder (PTSD)  Chronic anxiety  Opioid use disorder, severe, in early remission, on maintenance therapy, dependence ([place])  Stage of Change: Planning, Action  Sobriety Date: 1/22  Group Time: 1-2:30  Participation Level: Active  Behavioral Response: Appropriate and Sharing  Type of Therapy: Process Group  Interventions: CBT  Topic: PTs were active and engaged in group processing session. Counselor facilitated CBT-based and emotion-focused processing questions to help PTs discuss their recovery from mind-altering drugs/alcohol. Emphasis was placed on PTs disclosing their challenges and successes pertaining to their treatment plan goals. Multiple members were out due to sickness. One new member started group and met w/ medical director to establish care. UDS samples were collected from 4 members.       Group Time: 2:30-4  Participation Level: Active  Behavioral Response: Appropriate and Sharing  Type of Therapy: Psycho-education Group  Interventions: CBT  Topic: Patients were led in a 1 hour psychoeducation session on how to utilize a breathing technique to calm anxiety/cravings. PTs were instructed to use acronym SOBER; Stop, Observe, Breathe, Expand, Respond. PTs were taught how to use mindfulness to respond to their discomfort, stress. A handout was provided for PTs to read through and take home to practice.     Summary: PT was active, engaged, lucid in session. She made strong eye contact. She reported she is working on her self care by drinking 8 glasses of water per day. She showed group her water bottle. She also met w/ her new sponsor after a meeting and schedule a meeting to start working steps. PT admits she is very reluctant to start step work bc she has fear of abandonment. The  group encouraged PT to be open to new experiences. PT attended 1 AA meeting since last group.    UDS collected: Yes Results: Pending  AA/NA attended?: YesFriday  Sponsor?: Yes  [name], [facility], [place] 03/05/2019 4:56 PM

## 2019-03-06 ENCOUNTER — Other Ambulatory Visit: Payer: Self-pay

## 2019-03-06 ENCOUNTER — Other Ambulatory Visit (INDEPENDENT_AMBULATORY_CARE_PROVIDER_SITE_OTHER): Payer: Medicare Other | Admitting: Medical

## 2019-03-06 ENCOUNTER — Encounter (HOSPITAL_COMMUNITY): Payer: Self-pay | Admitting: Medical

## 2019-03-06 DIAGNOSIS — F4312 Post-traumatic stress disorder, chronic: Secondary | ICD-10-CM

## 2019-03-06 DIAGNOSIS — E039 Hypothyroidism, unspecified: Secondary | ICD-10-CM

## 2019-03-06 DIAGNOSIS — F419 Anxiety disorder, unspecified: Secondary | ICD-10-CM

## 2019-03-06 DIAGNOSIS — F1020 Alcohol dependence, uncomplicated: Secondary | ICD-10-CM

## 2019-03-06 DIAGNOSIS — F17200 Nicotine dependence, unspecified, uncomplicated: Secondary | ICD-10-CM

## 2019-03-06 DIAGNOSIS — F341 Dysthymic disorder: Secondary | ICD-10-CM

## 2019-03-06 DIAGNOSIS — F902 Attention-deficit hyperactivity disorder, combined type: Secondary | ICD-10-CM

## 2019-03-06 DIAGNOSIS — F1920 Other psychoactive substance dependence, uncomplicated: Secondary | ICD-10-CM

## 2019-03-06 DIAGNOSIS — K1379 Other lesions of oral mucosa: Secondary | ICD-10-CM

## 2019-03-06 DIAGNOSIS — F1120 Opioid dependence, uncomplicated: Secondary | ICD-10-CM

## 2019-03-06 DIAGNOSIS — F1121 Opioid dependence, in remission: Secondary | ICD-10-CM

## 2019-03-06 DIAGNOSIS — K7030 Alcoholic cirrhosis of liver without ascites: Secondary | ICD-10-CM

## 2019-03-06 DIAGNOSIS — Z6372 Alcoholism and drug addiction in family: Secondary | ICD-10-CM

## 2019-03-06 DIAGNOSIS — F1994 Other psychoactive substance use, unspecified with psychoactive substance-induced mood disorder: Secondary | ICD-10-CM

## 2019-03-06 MED ORDER — PENICILLIN V POTASSIUM 500 MG PO TABS: 500.0000 mg | ORAL_TABLET | Freq: Four times a day (QID) | ORAL | 0 refills | Status: AC
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
## 2019-03-07 ENCOUNTER — Other Ambulatory Visit: Payer: Self-pay

## 2019-03-07 ENCOUNTER — Other Ambulatory Visit (HOSPITAL_COMMUNITY): Payer: Medicare Other | Admitting: Psychology

## 2019-03-07 DIAGNOSIS — F1020 Alcohol dependence, uncomplicated: Secondary | ICD-10-CM | POA: Diagnosis not present

## 2019-03-07 DIAGNOSIS — F4312 Post-traumatic stress disorder, chronic: Secondary | ICD-10-CM

## 2019-03-07 DIAGNOSIS — F1121 Opioid dependence, in remission: Secondary | ICD-10-CM

## 2019-03-09 ENCOUNTER — Encounter (HOSPITAL_COMMUNITY): Payer: Self-pay | Admitting: Psychology
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
## 2019-03-09 NOTE — Progress Notes (Signed)
    Daily Group Progress Note  Program: CD-IOP   03/09/2019 Erin [name] [id number]  Diagnosis: Opioid Use Disorder, Alcohol Use Disorder, Chronic PTSD  Sobriety Date: 01/16/19  Group Time: 1-2:30pm  Participation Level: Active  Behavioral Response: Sharing  Type of Therapy: Process Group  Interventions: Supportive  Topic: Process: The first part of group began with process. Group members shared their experiences in early recovery since we last met on Wednesday. Group members participated in a popsicle stick check-in activity. One group member was absent due to being sick. No drug tests were collected.   Group Time: 2:30-4pm  Participation Level: Active  Behavioral Response: Sharing, appropriate  Type of Therapy: Psycho-Education  Interventions: CBT  Topic: Psycho-ed: Self Compassion & Graduation; The second half of group was spent in psycho-ed watching the Ted Talk by Marilynn Latino on self-compassion. Group members watched the video together and processed their reactions afterwards. During the latter part of psycho-ed, a graduation ceremony was held for a group member who successfully completed CD-IOP.  Summary: The patient reported that she had not attended any AA meetings since we last met. The patient reported that she spoke with her older sister about resolving a familial conflict between the two. The patient stated "it's over between me and her" in relation to their conversation not ending the way the patient had hoped. The patient processed her emotions around the conflict and group members offered supportive feedback. The patient's popsicle stick word was "relationships in addiction." The patient processed how her relationships in addiction were shallow compared to the types of relationships she is trying to build in recovery. During the psycho-ed portion of group, the patient attentively watched the Ted Talk video and participated in the group discussion afterwards.     UDS collected: No  AA/NA attended?: No  Sponsor?: Yes   Brandon Melnick, LCAS 03/09/2019 5:25 PM

## 2019-03-11 ENCOUNTER — Other Ambulatory Visit (HOSPITAL_COMMUNITY): Payer: Medicare Other | Admitting: Psychology

## 2019-03-11 ENCOUNTER — Encounter (HOSPITAL_COMMUNITY): Payer: Self-pay | Admitting: Medical

## 2019-03-11 ENCOUNTER — Other Ambulatory Visit: Payer: Self-pay

## 2019-03-11 ENCOUNTER — Encounter (HOSPITAL_COMMUNITY): Payer: Self-pay | Admitting: Psychology

## 2019-03-11 DIAGNOSIS — F1020 Alcohol dependence, uncomplicated: Secondary | ICD-10-CM | POA: Diagnosis not present

## 2019-03-11 DIAGNOSIS — F419 Anxiety disorder, unspecified: Secondary | ICD-10-CM

## 2019-03-11 DIAGNOSIS — F1121 Opioid dependence, in remission: Secondary | ICD-10-CM

## 2019-03-11 DIAGNOSIS — F4312 Post-traumatic stress disorder, chronic: Secondary | ICD-10-CM

## 2019-03-11 NOTE — Progress Notes (Signed)
    Daily Group Progress Note  Program: CD-IOP   03/11/2019 BRYNLEI KLAUSNER [id number]  Diagnosis:  Alcohol use disorder, severe, dependence ([place])  Opioid use disorder, severe, in early remission, on maintenance therapy, dependence (HCC)  Chronic post-traumatic stress disorder (PTSD)  Chronic anxiety  Stage of Change: Planning, Action  Sobriety Date: 1/22  Group Time: 1-2:30  Participation Level: Active  Behavioral Response: Appropriate, Sharing and Attention-Seeking  Type of Therapy: Process Group  Interventions: CBT  Topic: PTs were active and engaged in group processing session. Counselor facilitated CBT-based and emotion-focused processing questions to help PTs discuss their recovery from mind-altering drugs/alcohol. Emphasis was placed on PTs disclosing their challenges and successes pertaining to their treatment plan goals. One member was new to group today and met w/ Darlyne Russian, PA-C. 2 members were out due to sickness or fear of Coronavirus exposure. UDS results were returned and new samples were collected from some members.       Group Time: 2:30-4  Participation Level: Active  Behavioral Response: Appropriate and Sharing  Type of Therapy: Psycho-education Group  Interventions: CBT and Psychosocial Skills: Stress Management  Topic: Patients were led in a 1 hour psychoeducation session on stress reduction in recovery. A handout was provided to all members and they were asked to discuss the topic and write down answers and provide feedback to each other. Most members stated stress levels are high and they are in need of more coping skills. Particular attention was spent on the physiological reaction to stress, the "fight or flight" response and the improtance of the parasympathetic nervous system.     Summary: PT was active, engaged, well groomed in session. She reported that she realized over the weekend that she missed her Suboxone appointment at  [facility] on Thursday 3/12. She was able to use her backup supply of sublingual strips to cover her for weekend. She asked counselor if she should come off Suboxone. Counselor responded, "no" and that she should continue dosage as px by [facility] and recommended by Darlyne Russian, PA-C. PT continues to report fears of continuing treatment w/ [facility] due to "all the women there that would try to sell me drugs". Counselor and PT will work to secure an appropriate referral for Aftercare services.    UDS collected: Yes Results: Pending, 3/4 negative  AA/NA attended?: YesTuesday  Sponsor?: No   [name], [facility], [place] 03/11/2019 4:43 PM

## 2019-03-13 ENCOUNTER — Other Ambulatory Visit: Payer: Self-pay

## 2019-03-13 ENCOUNTER — Other Ambulatory Visit (HOSPITAL_COMMUNITY): Payer: Medicare Other | Admitting: Psychology

## 2019-03-13 DIAGNOSIS — F4312 Post-traumatic stress disorder, chronic: Secondary | ICD-10-CM

## 2019-03-13 DIAGNOSIS — F1020 Alcohol dependence, uncomplicated: Secondary | ICD-10-CM | POA: Diagnosis not present

## 2019-03-13 DIAGNOSIS — F419 Anxiety disorder, unspecified: Secondary | ICD-10-CM

## 2019-03-13 DIAGNOSIS — F1121 Opioid dependence, in remission: Secondary | ICD-10-CM

## 2019-03-14 ENCOUNTER — Other Ambulatory Visit (HOSPITAL_COMMUNITY): Payer: Medicare Other | Admitting: Psychology

## 2019-03-14 ENCOUNTER — Other Ambulatory Visit: Payer: Self-pay

## 2019-03-14 DIAGNOSIS — F1121 Opioid dependence, in remission: Secondary | ICD-10-CM

## 2019-03-14 DIAGNOSIS — F1020 Alcohol dependence, uncomplicated: Secondary | ICD-10-CM | POA: Diagnosis not present

## 2019-03-15 DIAGNOSIS — M25552 Pain in left hip: Secondary | ICD-10-CM | POA: Diagnosis not present

## 2019-03-15 DIAGNOSIS — M25551 Pain in right hip: Secondary | ICD-10-CM | POA: Diagnosis not present

## 2019-03-18 ENCOUNTER — Other Ambulatory Visit (HOSPITAL_COMMUNITY): Payer: Medicare Other

## 2019-03-18 ENCOUNTER — Encounter (HOSPITAL_COMMUNITY): Payer: Self-pay | Admitting: Psychology

## 2019-03-18 NOTE — Progress Notes (Signed)
    Daily Group Progress Note  Program: CD-IOP   03/18/2019 Erin [name] [id number]  Diagnosis: Opioid use disorder, Severe, Chronic PTSD  Sobriety Date: 01/16/19  Group Time: 1-2:30pm  Participation Level: Active  Behavioral Response: Sharing  Type of Therapy: Process Group  Interventions: Supportive  Topic: Process: The first part of group began with process. Group members shared their experiences in early recovery since we last met on Wednesday. Group members participated in a Popsicle stick check-in activity. Three drug tests were collected.   Group Time: 2:30-4pm  Participation Level: Active  Behavioral Response: Sharing  Type of Therapy: Psycho-education Group  Interventions: CBT  Topic: Psycho-ed: Self Compassion & Graduation; The second half of group was spent in psycho-ed discussing the importance of creating a daily schedule during times of uncertainty. During the latter part of psycho-ed, a graduation ceremony was held for a group member who successfully completed CD-IOP.  Summary: The patient reported that she attended one [place] meeting since we last met on Wednesday. The patient's popsicles stick check in word was "growth." The patient shared that group has caused her to grow "so much" since beginning her sobriety journey. The patient processed how this time in recovery is different from her previous attempts at recovery because she is "really listening." The patient was active and attentive during the process portion of group. During the psycho-ed portion of group, the patient completed the daily schedule activity and processed with the group different activities she could do to help fill her time. The patient continues to struggle with social isolation and feelings of loneliness.    UDS collected: No   AA/NA attended?: YesThursday  Sponsor?: Yes   Brandon Melnick, [place] 03/18/2019 12:47 PM

## 2019-03-19 ENCOUNTER — Encounter (HOSPITAL_COMMUNITY): Payer: Self-pay | Admitting: Psychology

## 2019-03-19 NOTE — Progress Notes (Signed)
    Daily Group Progress Note  Program: CD-IOP   03/19/2019 Erin [name] [id number]  Diagnosis:  Alcohol use disorder, severe, dependence ([place])  Opioid use disorder, severe, in early remission, on maintenance therapy, dependence (HCC)  Chronic post-traumatic stress disorder (PTSD)  Chronic anxiety  Stage of Change: Action  Sobriety Date: 1/22  Group Time: 1-2:30  Participation Level: Active  Behavioral Response: Appropriate and Sharing  Type of Therapy: Process Group  Interventions: CBT  Topic: PTs were active and engaged in group processing session. Counselor facilitated CBT-based and emotion-focused processing questions to help PTs discuss their recovery from mind-altering drugs/alcohol. Emphasis was placed on PTs disclosing their challenges and successes pertaining to their treatment plan goals. One member had relapsed and appeared upset when disclosing his new soriety date. 3 Members met w/ Darlyne Russian, PA-C for medication management. One member graduated successfully. One new member started group today.      Group Time: 2:30-4  Participation Level: Active  Behavioral Response: Appropriate and Sharing  Type of Therapy: Psycho-education Group  Interventions: CBT  Topic: Patients were led in a 1 hour psychoeducation session on manageing anxiety by [facility] Lissa Morales. He led group in a discussion on cognitive challenging for anxiety and stress reduction. Counselor also led a 10 min stress reduction meditation which emphasized grounding and deep breathing.     Summary: PT was active, engaged, tearful, and joking during session. She connects strongly to Chaplain and seems eager to please him. PT states she has returned to her medications and feels better. PT tells group she has decided she wants to move to a new house and wants "a fresh start somewhere else in [place]". PT reports she still experiences cravings, and can "taste alcohol" when she  is stressed.  She continues to attend a minimum number of AA meetings, though she is exchanging numbers w/ other women in [place]. PT states she feels "she is getting more depressed". Counselor discussed underlying depression and PAWS sxs.    UDS collected: Yes Results: negative  AA/NA attended?: YesWednesday  Sponsor?: No   Erin [name], [facility], [place] 03/19/2019 9:38 AM

## 2019-03-20 ENCOUNTER — Other Ambulatory Visit (HOSPITAL_COMMUNITY): Payer: Medicare Other

## 2019-03-20 ENCOUNTER — Telehealth (HOSPITAL_COMMUNITY): Payer: Self-pay | Admitting: Psychology

## 2019-03-21 ENCOUNTER — Other Ambulatory Visit (HOSPITAL_COMMUNITY): Payer: Medicare Other | Admitting: Psychology

## 2019-03-21 ENCOUNTER — Other Ambulatory Visit: Payer: Self-pay

## 2019-03-21 DIAGNOSIS — F1020 Alcohol dependence, uncomplicated: Secondary | ICD-10-CM

## 2019-03-21 DIAGNOSIS — F1121 Opioid dependence, in remission: Secondary | ICD-10-CM

## 2019-03-21 DIAGNOSIS — F4312 Post-traumatic stress disorder, chronic: Secondary | ICD-10-CM

## 2019-03-21 DIAGNOSIS — F419 Anxiety disorder, unspecified: Secondary | ICD-10-CM

## 2019-03-25 ENCOUNTER — Other Ambulatory Visit (INDEPENDENT_AMBULATORY_CARE_PROVIDER_SITE_OTHER): Payer: Medicare Other | Admitting: Psychology

## 2019-03-25 ENCOUNTER — Other Ambulatory Visit: Payer: Self-pay

## 2019-03-25 DIAGNOSIS — F419 Anxiety disorder, unspecified: Secondary | ICD-10-CM

## 2019-03-25 DIAGNOSIS — F1121 Opioid dependence, in remission: Secondary | ICD-10-CM

## 2019-03-25 DIAGNOSIS — F1210 Cannabis abuse, uncomplicated: Secondary | ICD-10-CM

## 2019-03-25 DIAGNOSIS — F1920 Other psychoactive substance dependence, uncomplicated: Secondary | ICD-10-CM

## 2019-03-25 DIAGNOSIS — F341 Dysthymic disorder: Secondary | ICD-10-CM

## 2019-03-25 DIAGNOSIS — F4312 Post-traumatic stress disorder, chronic: Secondary | ICD-10-CM

## 2019-03-25 DIAGNOSIS — F17200 Nicotine dependence, unspecified, uncomplicated: Secondary | ICD-10-CM

## 2019-03-25 DIAGNOSIS — F1020 Alcohol dependence, uncomplicated: Secondary | ICD-10-CM

## 2019-03-25 DIAGNOSIS — Z6372 Alcoholism and drug addiction in family: Secondary | ICD-10-CM

## 2019-03-25 DIAGNOSIS — Z91199 Patient's noncompliance with other medical treatment and regimen due to unspecified reason: Secondary | ICD-10-CM

## 2019-03-25 DIAGNOSIS — Z5329 Procedure and treatment not carried out because of patient's decision for other reasons: Secondary | ICD-10-CM

## 2019-03-25 DIAGNOSIS — E039 Hypothyroidism, unspecified: Secondary | ICD-10-CM

## 2019-03-25 DIAGNOSIS — K7030 Alcoholic cirrhosis of liver without ascites: Secondary | ICD-10-CM
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
## 2019-03-26 ENCOUNTER — Encounter (HOSPITAL_COMMUNITY): Payer: Self-pay | Admitting: Psychology

## 2019-03-26 NOTE — Progress Notes (Signed)
    Daily Group Progress Note  Program: CD-IOP   03/26/2019 Erin [name] [id number]  Diagnosis:  Opioid use disorder, severe, in early remission, on maintenance therapy, dependence ([place])  Alcohol use disorder, severe, dependence (HCC)  Chronic post-traumatic stress disorder (PTSD)  Chronic anxiety  Stage of Change: Action  Sobriety Date: 1/22  Group Time: 1-2:30  Participation Level: Active  Behavioral Response: Appropriate and Sharing  Type of Therapy: Process Group  Interventions: CBT  Topic: PTs were active and engaged in group processing session. Counselor facilitated CBT-based and emotion-focused processing questions to help PTs discuss their recovery from mind-altering drugs/alcohol. Emphasis was placed on PTs disclosing their challenges and successes pertaining to their treatment plan goals. One new member joined today.       Group Time: 2:30-4  Participation Level: Active  Behavioral Response: Appropriate and Sharing  Type of Therapy: Psycho-education Group  Interventions: CBT and Assertiveness Training  Topic: Patients were led in a 1 hour psychoeducation session on various forms of mindfulness including guided imagery and grounding techniques for mental, physical, and soothing grounding. PT participated actively in all activities.     Summary: PT was active, engaged in group. PT attended 1 AA meeting since last group. She reported she is "very afraid of relapsing" and her anxiety is up due to COVID-19. PT feels she needs long-term option for support.    UDS collected: No Results: negative  AA/NA attended?: YesThursday  Sponsor?: No   Erin [name], [facility], [place] 03/26/2019 11:30 AM

## 2019-03-27 ENCOUNTER — Other Ambulatory Visit: Payer: Self-pay

## 2019-03-27 ENCOUNTER — Encounter (HOSPITAL_COMMUNITY): Payer: Self-pay | Admitting: Medical

## 2019-03-27 ENCOUNTER — Other Ambulatory Visit (HOSPITAL_COMMUNITY): Payer: Medicare Other | Admitting: Psychology

## 2019-03-27 ENCOUNTER — Other Ambulatory Visit (HOSPITAL_COMMUNITY): Payer: Medicare Other | Attending: Psychiatry | Admitting: Psychology

## 2019-03-27 ENCOUNTER — Other Ambulatory Visit (HOSPITAL_COMMUNITY): Payer: Self-pay | Admitting: Medical

## 2019-03-27 DIAGNOSIS — F1020 Alcohol dependence, uncomplicated: Secondary | ICD-10-CM | POA: Diagnosis not present

## 2019-03-27 DIAGNOSIS — Z6835 Body mass index (BMI) 35.0-35.9, adult: Secondary | ICD-10-CM | POA: Diagnosis not present

## 2019-03-27 DIAGNOSIS — F341 Dysthymic disorder: Secondary | ICD-10-CM | POA: Insufficient documentation

## 2019-03-27 DIAGNOSIS — F902 Attention-deficit hyperactivity disorder, combined type: Secondary | ICD-10-CM | POA: Insufficient documentation

## 2019-03-27 DIAGNOSIS — F1210 Cannabis abuse, uncomplicated: Secondary | ICD-10-CM | POA: Insufficient documentation

## 2019-03-27 DIAGNOSIS — F419 Anxiety disorder, unspecified: Secondary | ICD-10-CM | POA: Insufficient documentation

## 2019-03-27 DIAGNOSIS — K7030 Alcoholic cirrhosis of liver without ascites: Secondary | ICD-10-CM | POA: Insufficient documentation

## 2019-03-27 DIAGNOSIS — F1994 Other psychoactive substance use, unspecified with psychoactive substance-induced mood disorder: Secondary | ICD-10-CM

## 2019-03-27 DIAGNOSIS — F1120 Opioid dependence, uncomplicated: Secondary | ICD-10-CM | POA: Insufficient documentation

## 2019-03-27 DIAGNOSIS — Z23 Encounter for immunization: Secondary | ICD-10-CM | POA: Insufficient documentation

## 2019-03-27 DIAGNOSIS — F4312 Post-traumatic stress disorder, chronic: Secondary | ICD-10-CM | POA: Diagnosis not present

## 2019-03-27 DIAGNOSIS — Z79899 Other long term (current) drug therapy: Secondary | ICD-10-CM | POA: Insufficient documentation

## 2019-03-27 DIAGNOSIS — E039 Hypothyroidism, unspecified: Secondary | ICD-10-CM | POA: Insufficient documentation

## 2019-03-27 DIAGNOSIS — F1410 Cocaine abuse, uncomplicated: Secondary | ICD-10-CM | POA: Diagnosis not present

## 2019-03-27 DIAGNOSIS — F17200 Nicotine dependence, unspecified, uncomplicated: Secondary | ICD-10-CM

## 2019-03-27 DIAGNOSIS — F1121 Opioid dependence, in remission: Secondary | ICD-10-CM

## 2019-03-27 DIAGNOSIS — Z6372 Alcoholism and drug addiction in family: Secondary | ICD-10-CM

## 2019-03-27 DIAGNOSIS — F1920 Other psychoactive substance dependence, uncomplicated: Secondary | ICD-10-CM

## 2019-03-27 MED ORDER — TRAZODONE HCL 50 MG PO TABS: 50.0000 mg | ORAL_TABLET | Freq: Every evening | ORAL | 0 refills | Status: AC | PRN

## 2019-03-27 NOTE — Progress Notes (Signed)
[facility]                                                                                                                                                                     CD-IOP DISCHARGE NOTE   Date of Admission: 01/14/19 Referall Provider: Patient Date of Discharge: 03/27/2019 Sobriety Date:12/28/2018  Course of Treatment:  Pt sought CDIOP for accountability in addition to her Suboxone MAT at [facility]. She has attended 26 Group counseling sessions and weekly individual sessions. Her UDS have been consistently free of illcit/unprescribed drugs. Despite her long history of trauma and chemical dependency and her feelings that she was "different" from group members she was able to `keep coming back and share.She has a long ways to go to heal but she has made a good start in this difficult period of COVID 19 epidemic. Shew will grduate successful.   Medications: atomoxetine 100 MG capsule  Commonly known as: STRATTERA  atomoxetine 100 mg capsule TAKE ONE CAPSULE BY MOUTH EVERY DAY FOR ADHD  buprenorphine 8 MG Subl SL tablet  Commonly known as: SUBUTEX  PLACE ONE TABLET UNDER THE TONGUE EVERY EIGHT HOURS  buPROPion 150 MG 12 hr tablet  Commonly known as: WELLBUTRIN SR  Take 150 mg by mouth every morning.  cyanocobalamin 1000 MCG/ML injection  Commonly known as: (VITAMIN B-12)  1000 mcg once a month intramuscularly 30 day(s)  DULoxetine 60 MG capsule  Commonly known as: CYMBALTA  Take 1 capsule (60 mg total) by mouth daily. For depression  fluconazole 150 MG tablet  Commonly known  as: DIFLUCAN  Take 1 daily for 3 days  fluticasone 50 MCG/ACT nasal  spray  Commonly known as: FLONASE  1 spray(s) once a day intranasally 30 day(s)  folic acid 1 MG tablet  Commonly known as: FOLVITE  1 tab(s) once a day orally 30 day(s)  gabapentin 300 MG capsule  Commonly known as: NEURONTIN  Take 300 mg by mouth 3 (three) times daily.  lactulose 10 GM/15ML solution  Commonly known as: CHRONULAC  Take 30 mLs (20 g total) by mouth 3 (three) times daily. (May buy from over there counter): For constipation/ammonia control  levothyroxine 50 MCG tablet  Commonly known as: SYNTHROID, LEVOTHROID  Take 1 tablet (50 mcg total) by mouth daily before breakfast. For low functioning thyroid  losartan 25 MG tablet  Commonly known as: COZAAR  1 tab(s) once a day orally 30 day(s)  mirtazapine 30 MG tablet  Commonly known as: REMERON  Take 1 tablet (30 mg) by mouth daily at bedtime  omeprazole 20 MG capsule  Commonly known as: PRILOSEC  omeprazole 20 mg capsule,delayed release  ondansetron 8 MG disintegrating tablet  Commonly known as: ZOFRAN-ODT  TAKE ONE TABLET BY MOUTH EVERY EIGHT HOURS  pneumococcal 23 valent vaccine 25 MCG/0.5ML injection  Commonly known as: PNU-IMMUNE  Pneumovax-23 25 mcg/0.5 mL injection solution inject 0.9ml INTRAMUSCUALRY  polyethylene glycol powder powder  Commonly known as: GLYCOLAX/MIRALAX  polyethylene glycol 3350 17 gram/dose oral powder  prazosin 5 MG capsule  Commonly known as: MINIPRESS  ProAir HFA 108 (90 Base) MCG/ACT inhaler  Generic drug: albuterol  Rexulti 2 MG Tabs  Generic drug: Brexpiprazole  Take 1 tablet by mouth at bedtime.  sertraline 50 MG tablet  Commonly known as: ZOLOFT  Take 50 mg by mouth every morning.  Suboxone 8-2 MG Film  Generic drug: Buprenorphine HCl-Naloxone HCl  Symbicort 160-4.5 MCG/ACT inhaler  Generic drug: budesonide-formoterol  Symbicort 160 mcg-4.5 mcg/actuation HFA aerosol inhaler  traZODone 100 MG tablet  Commonly known as: DESYREL  Take 1 tablet (100 mg total) by mouth at  bedtime as needed for sleep.  Victoza 18 MG/3ML Sopn  Generic drug: liraglutide  INJECT 1.2 MG SUBCUTANEOUSLY EVERY DAY FOR ONE WEEK, THEN 1.8 MG EVERY DAY FOR ONE WEEK, THEN 2.4 MG EVERY DAY FOR ONE WEEK, THEN 3.0 MG EVE   Discharge Diagnosis: 1. Alcohol use disorder, severe, dependence ([place]) F10.20   2. Uncomplicated opioid dependence ([place]) F11.20   3. Cannabis abuse, daily use F12.10   4. Chronic post-traumatic stress disorder (PTSD) F43.12   5. Substance or medication-induced bipolar and related disorder ([place]) F19.94   6. Dysthymia (or depressive neurosis) F34.1   7. Chronic anxiety F41.9   8. TOBACCO DEPENDENCE F17.200   9. Alcoholic cirrhosis, unspecified whether ascites present ([place]) K70.30   10. Severe obesity (BMI 35.0-39.9) with comorbidity ([place]) E66.01   11. Attention deficit hyperactivity disorder (ADHD), combined type F90.2   12. Acquired hypothyroidism E03.9   13. Polysubstance abuse ([place]) F19.10    Cocaine ;Benzos;Hallucinogens     Plan of Action to Address Continuing Problems:  Goals and Activities to Help Maintain Sobriety: 1. Stay away from people ,places and things that are triggers 2. Continue practicing Fair Fighting rules in interpersonal conflicts. 3. Continue alcohol and drug refusal skills and call on support systems  4. Attend AA/NA meetings AT LEAST as often as you used  5. Obtain a sponsor and a home group in [place]. 6. Return to [facility] as scheduled for Suboxone treatments  Referrals: Will work with Counselor [name] to establish ongoing counseling for her trauma and substance abuse/chemical dependency.Waiting to hear from [facility] for Psychiatric Medication  Management   Aftercare services: Via telepsych after training  Next appointment: [facility] weekly Thursday  Prognosis:Guarded    Client has participated in the development of this discharge plan and has received a copy of this completed plan  Patient ID:  Erin [name], female   DOB: 12/04/1966, 53 y.o.   MRN: [id number]

## 2019-03-28 ENCOUNTER — Other Ambulatory Visit: Payer: Self-pay

## 2019-03-28 ENCOUNTER — Other Ambulatory Visit (HOSPITAL_COMMUNITY): Payer: Medicare Other

## 2019-03-28 ENCOUNTER — Encounter (HOSPITAL_COMMUNITY): Payer: Self-pay | Admitting: Psychology
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
## 2019-04-01 ENCOUNTER — Other Ambulatory Visit (HOSPITAL_COMMUNITY): Payer: Medicare Other

## 2019-04-01 ENCOUNTER — Encounter (HOSPITAL_COMMUNITY): Payer: Self-pay | Admitting: Psychology
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
## 2019-04-03 ENCOUNTER — Other Ambulatory Visit (HOSPITAL_COMMUNITY): Payer: Medicare Other

## 2019-04-03 ENCOUNTER — Telehealth (HOSPITAL_COMMUNITY): Payer: Self-pay | Admitting: Psychology

## 2019-04-04 ENCOUNTER — Other Ambulatory Visit (HOSPITAL_COMMUNITY): Payer: Medicare Other

## 2019-04-08 ENCOUNTER — Other Ambulatory Visit (HOSPITAL_COMMUNITY): Payer: Medicare Other

## 2019-04-08 ENCOUNTER — Telehealth (HOSPITAL_COMMUNITY): Payer: Self-pay | Admitting: Psychology

## 2019-04-09 ENCOUNTER — Ambulatory Visit (INDEPENDENT_AMBULATORY_CARE_PROVIDER_SITE_OTHER): Payer: Medicare Other | Admitting: Psychiatry

## 2019-04-09 ENCOUNTER — Other Ambulatory Visit: Payer: Self-pay

## 2019-04-09 DIAGNOSIS — F1920 Other psychoactive substance dependence, uncomplicated: Secondary | ICD-10-CM | POA: Diagnosis not present

## 2019-04-09 DIAGNOSIS — F1010 Alcohol abuse, uncomplicated: Secondary | ICD-10-CM | POA: Diagnosis not present

## 2019-04-09 DIAGNOSIS — F319 Bipolar disorder, unspecified: Secondary | ICD-10-CM | POA: Diagnosis not present

## 2019-04-09 DIAGNOSIS — F4312 Post-traumatic stress disorder, chronic: Secondary | ICD-10-CM
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
## 2019-04-10 ENCOUNTER — Other Ambulatory Visit (HOSPITAL_COMMUNITY): Payer: Medicare Other

## 2019-04-11 ENCOUNTER — Other Ambulatory Visit (HOSPITAL_COMMUNITY): Payer: Medicare Other

## 2019-04-15 ENCOUNTER — Other Ambulatory Visit (HOSPITAL_COMMUNITY): Payer: Medicare Other

## 2019-04-16 ENCOUNTER — Telehealth: Payer: Self-pay | Admitting: Physician Assistant

## 2019-04-16 ENCOUNTER — Telehealth: Payer: Self-pay | Admitting: *Deleted

## 2019-04-16 NOTE — Telephone Encounter (Signed)
Called and left the patient a message to call the office back. Need to move her lab appt to either today or first thing tomorrow morning.

## 2019-04-16 NOTE — Telephone Encounter (Signed)
Changed time of 4/22 lab per sch msg request. Called patient and was not able to get in touch with her. Left message. Informed MD as well

## 2019-04-17 ENCOUNTER — Inpatient Hospital Stay: Payer: Medicare Other | Attending: Oncology | Admitting: Physician Assistant

## 2019-04-17 ENCOUNTER — Ambulatory Visit: Payer: Self-pay | Admitting: Oncology

## 2019-04-17 ENCOUNTER — Other Ambulatory Visit: Payer: Self-pay

## 2019-04-17 ENCOUNTER — Telehealth: Payer: Self-pay | Admitting: Physician Assistant

## 2019-04-17 NOTE — Telephone Encounter (Signed)
I called the patient and left a voicemail regarding her telephone appointment with [place] today. The patient never showed up for her lab appointment as well. Given the fact that her condition is not urgent and is stable, I left a voicemail letting her know that I will send a message to our schedulers to reschedule her for about 2 months from now. I left a number for our office should she have any questions or concerns.

## 2019-04-18 ENCOUNTER — Telehealth: Payer: Self-pay | Admitting: Physician Assistant

## 2019-04-18 NOTE — Telephone Encounter (Signed)
Scheduled appt for 1mths out per sch msg. Mailed printout

## 2019-04-29 ENCOUNTER — Encounter: Payer: Self-pay | Admitting: *Deleted

## 2019-04-29 ENCOUNTER — Telehealth: Payer: Self-pay | Admitting: *Deleted

## 2019-04-29 NOTE — Telephone Encounter (Signed)
Received disability paperwork to our office.  Per Dr. Julien Nordmann and Rubin Payor Heilingoetter, PA patient needs to have paperwork completed and supported by PCP.  Returned with letter and last office visit.  See letters for documentation.  Faxed this to [phone]

## 2019-06-17 ENCOUNTER — Telehealth: Payer: Self-pay

## 2019-06-17 ENCOUNTER — Other Ambulatory Visit: Payer: Self-pay | Admitting: Physician Assistant

## 2019-06-17 DIAGNOSIS — D696 Thrombocytopenia, unspecified: Secondary | ICD-10-CM

## 2019-06-17 NOTE — Telephone Encounter (Signed)
Called and left voicemail regarding pre-screening questions for appt on 6/23  

## 2019-06-18 ENCOUNTER — Ambulatory Visit: Payer: Medicare Other | Admitting: Physician Assistant

## 2019-06-18 ENCOUNTER — Other Ambulatory Visit: Payer: Self-pay

## 2019-08-27 DEATH — deceased

## 2020-02-28 NOTE — Telephone Encounter (Signed)
Error
